# Patient Record
Sex: Male | Born: 1985 | Race: White | Hispanic: No | Marital: Single | Smoking: Heavy tobacco smoker
Health system: Southern US, Community
[De-identification: ages and names within clinical notes are randomized; demographics above are authoritative.]

## PROBLEM LIST (undated history)

## (undated) DIAGNOSIS — K254 Chronic or unspecified gastric ulcer with hemorrhage: Secondary | ICD-10-CM

## (undated) DIAGNOSIS — E669 Obesity, unspecified: Secondary | ICD-10-CM

## (undated) DIAGNOSIS — K8020 Calculus of gallbladder without cholecystitis without obstruction: Secondary | ICD-10-CM

## (undated) DIAGNOSIS — M109 Gout, unspecified: Secondary | ICD-10-CM

## (undated) DIAGNOSIS — L0390 Cellulitis, unspecified: Secondary | ICD-10-CM

## (undated) DIAGNOSIS — D62 Acute posthemorrhagic anemia: Secondary | ICD-10-CM

## (undated) DIAGNOSIS — N390 Urinary tract infection, site not specified: Secondary | ICD-10-CM

## (undated) DIAGNOSIS — K2210 Ulcer of esophagus without bleeding: Secondary | ICD-10-CM

## (undated) HISTORY — PX: TONSILLECTOMY: SUR1361

## (undated) SURGERY — EGD (ESOPHAGOGASTRODUODENOSCOPY)
Anesthesia: Moderate Sedation | Laterality: Left

---
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
## 2005-04-25 ENCOUNTER — Emergency Department (HOSPITAL_COMMUNITY): Admission: EM | Admit: 2005-04-25 | Discharge: 2005-04-26 | Payer: Self-pay | Admitting: Emergency Medicine

## 2006-12-17 ENCOUNTER — Emergency Department (HOSPITAL_COMMUNITY): Admission: EM | Admit: 2006-12-17 | Discharge: 2006-12-18 | Payer: Self-pay | Admitting: Emergency Medicine

## 2007-02-17 ENCOUNTER — Emergency Department (HOSPITAL_COMMUNITY): Admission: EM | Admit: 2007-02-17 | Discharge: 2007-02-17 | Payer: Self-pay | Admitting: Emergency Medicine

## 2007-04-05 ENCOUNTER — Emergency Department (HOSPITAL_COMMUNITY): Admission: EM | Admit: 2007-04-05 | Discharge: 2007-04-05 | Payer: Self-pay | Admitting: Emergency Medicine

## 2007-10-15 ENCOUNTER — Inpatient Hospital Stay (HOSPITAL_COMMUNITY): Admission: AD | Admit: 2007-10-15 | Discharge: 2007-10-22 | Payer: Self-pay | Admitting: Family Medicine

## 2008-04-19 ENCOUNTER — Emergency Department (HOSPITAL_COMMUNITY): Admission: EM | Admit: 2008-04-19 | Discharge: 2008-04-19 | Payer: Self-pay | Admitting: Emergency Medicine

## 2008-05-08 ENCOUNTER — Emergency Department (HOSPITAL_COMMUNITY): Admission: EM | Admit: 2008-05-08 | Discharge: 2008-05-08 | Payer: Self-pay | Admitting: Emergency Medicine

## 2008-05-10 ENCOUNTER — Emergency Department (HOSPITAL_COMMUNITY): Admission: EM | Admit: 2008-05-10 | Discharge: 2008-05-10 | Payer: Self-pay | Admitting: Emergency Medicine

## 2008-07-26 ENCOUNTER — Emergency Department (HOSPITAL_COMMUNITY): Admission: EM | Admit: 2008-07-26 | Discharge: 2008-07-26 | Payer: Self-pay | Admitting: Emergency Medicine

## 2008-11-15 ENCOUNTER — Emergency Department (HOSPITAL_COMMUNITY): Admission: EM | Admit: 2008-11-15 | Discharge: 2008-11-15 | Payer: Self-pay | Admitting: Emergency Medicine

## 2009-01-29 ENCOUNTER — Emergency Department (HOSPITAL_COMMUNITY): Admission: EM | Admit: 2009-01-29 | Discharge: 2009-01-29 | Payer: Self-pay | Admitting: Emergency Medicine

## 2010-05-21 ENCOUNTER — Emergency Department (HOSPITAL_COMMUNITY)
Admission: EM | Admit: 2010-05-21 | Discharge: 2010-05-21 | Disposition: A | Discharge status: Home / Self Care | Payer: Self-pay | Attending: Emergency Medicine | Admitting: Emergency Medicine

## 2010-05-21 DIAGNOSIS — M79609 Pain in unspecified limb: Secondary | ICD-10-CM | POA: Insufficient documentation

## 2010-05-21 DIAGNOSIS — M109 Gout, unspecified: Secondary | ICD-10-CM | POA: Insufficient documentation

## 2010-05-21 DIAGNOSIS — M7989 Other specified soft tissue disorders: Secondary | ICD-10-CM | POA: Insufficient documentation

## 2010-05-21 DIAGNOSIS — M2560 Stiffness of unspecified joint, not elsewhere classified: Secondary | ICD-10-CM | POA: Insufficient documentation

## 2010-06-24 ENCOUNTER — Emergency Department (HOSPITAL_COMMUNITY)
Admission: EM | Admit: 2010-06-24 | Discharge: 2010-06-25 | Disposition: A | Discharge status: Home / Self Care | Payer: Self-pay | Attending: Emergency Medicine | Admitting: Emergency Medicine

## 2010-06-24 DIAGNOSIS — M25539 Pain in unspecified wrist: Secondary | ICD-10-CM | POA: Insufficient documentation

## 2010-06-24 DIAGNOSIS — M79609 Pain in unspecified limb: Secondary | ICD-10-CM | POA: Insufficient documentation

## 2010-06-24 DIAGNOSIS — M109 Gout, unspecified: Secondary | ICD-10-CM | POA: Insufficient documentation

## 2010-06-24 DIAGNOSIS — M25439 Effusion, unspecified wrist: Secondary | ICD-10-CM | POA: Insufficient documentation

## 2010-06-24 DIAGNOSIS — F17200 Nicotine dependence, unspecified, uncomplicated: Secondary | ICD-10-CM | POA: Insufficient documentation

## 2010-07-11 ENCOUNTER — Emergency Department (HOSPITAL_COMMUNITY): Payer: Self-pay

## 2010-07-11 ENCOUNTER — Emergency Department (HOSPITAL_COMMUNITY)
Admission: EM | Admit: 2010-07-11 | Discharge: 2010-07-11 | Disposition: A | Discharge status: Home / Self Care | Payer: Self-pay | Attending: Emergency Medicine | Admitting: Emergency Medicine

## 2010-07-11 DIAGNOSIS — S93609A Unspecified sprain of unspecified foot, initial encounter: Secondary | ICD-10-CM | POA: Insufficient documentation

## 2010-07-11 DIAGNOSIS — X500XXA Overexertion from strenuous movement or load, initial encounter: Secondary | ICD-10-CM | POA: Insufficient documentation

## 2010-07-11 DIAGNOSIS — Y92009 Unspecified place in unspecified non-institutional (private) residence as the place of occurrence of the external cause: Secondary | ICD-10-CM | POA: Insufficient documentation

## 2010-08-01 LAB — CBC
MCHC: 33.1 g/dL (ref 30.0–36.0)
MCV: 88 fL (ref 78.0–100.0)
Platelets: 299 10*3/uL (ref 150–400)
RBC: 4.92 MIL/uL (ref 4.22–5.81)
RDW: 12.8 % (ref 11.5–15.5)

## 2010-08-01 LAB — BASIC METABOLIC PANEL
GFR calc Af Amer: >60 mL/min (ref >60)
Glucose, Bld: 110 mg/dL — ABNORMAL HIGH (ref 70–99)
Potassium: 3.6 mEq/L (ref 3.5–5.1)
Sodium: 137 mEq/L (ref 135–145)

## 2010-08-01 LAB — DIFFERENTIAL
Basophils Relative: 0 % (ref 0–1)
Eosinophils Relative: 2 % (ref 0–5)
Lymphocytes Relative: 22 % (ref 12–46)
Lymphs Abs: 2.8 10*3/uL (ref 0.7–4.0)
Monocytes Absolute: 1.1 10*3/uL — ABNORMAL HIGH (ref 0.1–1.0)
Monocytes Relative: 9 % (ref 3–12)

## 2010-08-30 NOTE — Group Therapy Note (Signed)
[name], [name]             ACCOUNT NO.:  [id number]   MEDICAL RECORD NO.:  [id number]          PATIENT TYPE:  INP   LOCATION:  A307                          FACILITY:  [facility]   PHYSICIAN:  Scott A. Gerda Diss, MD    DATE OF BIRTH:  August 12, 1985   DATE OF PROCEDURE:  DATE OF DISCHARGE:                                 PROGRESS NOTE   This gentleman is actually doing a little bit better.  His white count  11.4 and potassium is little bit low at 3.3, so we will need to  supplement that.  His cellulitis on his leg does not really look  tremendously improved, but the patient says it is somewhat compared to  where it was, still lot of inflammation and tenderness.  He needs to be  in the hospital and we will continue on with antibiotic treatment.      Scott A. Gerda Diss, MD  Electronically Signed     SAL/MEDQ  D:  10/17/2007  T:  10/17/2007  Job:  [id number]

## 2010-08-30 NOTE — H&P (Signed)
[name], [name]             ACCOUNT NO.:  [id number]   MEDICAL RECORD NO.:  [id number]          PATIENT TYPE:  INP   LOCATION:  A307                          FACILITY:  [facility]   PHYSICIAN:  Donna Bernard, M.D.DATE OF BIRTH:  11-26-1985   DATE OF ADMISSION:  10/15/2007  DATE OF DISCHARGE:  LH                              HISTORY & PHYSICAL   CHIEF COMPLAINT:  Cough, congestion, headache, chills, fever, left leg  pain, swelling, and tenderness.   SUBJECTIVE:  This patient is a 25 year old white male with history of  morbid obesity who arrives to the office on the date of admission with  multiple complaints.  He noted approximately 4 days ago, he developed  headache.  This associated with cough, congestion.  He felt chills,  achy, and fever.  He was aching from head to toe.  He had quite a bit of  cough and it was generally nonproductive.  Within a day of that, he  notes his left leg started swelling, noted redness, and tenderness and  he watched the red area swell more and more, in the last couple nights,  he has had fever and chills.   PRIOR HOSPITALIZATIONS:  None.   ALLERGIES:  PENICILLIN and SULFA.   SOCIAL HISTORY:  The patient lives with parents still, currently  unemployed.   CHRONIC MEDICATIONS:  None.   PRIOR MEDICAL HISTORY:  Significant for ongoing morbid obesity.  The  patient has been advised in the past, he needs to consider further  workup of his morbid obesity and he has declined that multiple times.   REVIEW OF SYSTEMS:  Otherwise negative.  Temperature 101 degrees, pulse  100.  Significant obesity noted.  HEENT:  Normal.  Mild nasal  congestion.  NECK:  Supple.  LUNGS:  Clear.  HEART:  Regular rhythm.  ABDOMEN:  Large.  No obvious tenderness.  Left anterior leg, impressive  large patch of cellulitis exquisitely tender associated with edema.   IMPRESSION:  1. Cellulitis.  2. Probable H1N1 flu since he is a day 4 with symptomatology there,      meds  will not help.  3. Morbid obesity.  Certainly, a risk factor for this cellulitis with      venostasis.   PLAN:  IV fluids, antibiotics, pain, nausea control.  We will check a D-  dimer.  Further recommendations noted in chart.      Donna Bernard, M.D.  Electronically Signed     WSL/MEDQ  D:  10/16/2007  T:  10/17/2007  Job:  [id number]

## 2010-09-02 NOTE — Discharge Summary (Signed)
NAME:  [name], [name]          ACCOUNT NO.:  [id number]   MEDICAL RECORD NO.:  [id number]         PATIENT TYPE:  PINP   LOCATION:  A307                          FACILITY:  [facility]   PHYSICIAN:  Donna Bernard, M.D.DATE OF BIRTH:  June 12, 1985   DATE OF ADMISSION:  10/15/2007  DATE OF DISCHARGE:  07/07/2009LH                               DISCHARGE SUMMARY   FINAL DIAGNOSES:  1. Cellulitis.  2. Flu.  3. Morbid obesity.   FINAL DISPOSITION:  1. The patient discharged home.  2. The patient recommended followup on July 13 with Dr. Simone Curia.  3. Vicodin ES 1 p.o.  q 4-6h p.r.n. for pain, #30, no refills.  4. Doxycycline 100 mg 1 p.o. b.i.d. for 10 days.  5. Keflex 500 mg, 1 q.i.d. for 10 days.   INITIAL HISTORY AND PHYSICAL:  Please see H&P as dictated.   HOSPITAL COURSE:  This patient is a 25 year old white male with history  of morbid obesity arrived into the office on the day of admission with  cough, congestion, and headaches several days prior and a left leg  swelling, pain, redness, and tenderness in the couple of days prior.  He  also had fever and chills.  The patient was felt to be suffering from  the flu.  He was placed on appropriate restrictions.  He was given IV  Rocephin and doxycycline for his cellulitis.  Morphine was administered  p.r.n. for pain.  The patient's obesity led to difficulties with IV  access, therefore, we had to place a PICC line for access.  The patient  was given Lovenox for his diminished mobility, physical therapy folks  were consulted to try to ambulate him.  The patient each day improved.  On the day of discharge, his tenderness was minimal.  His white blood  count had come down to 10,000.  I note a D-dimer soon after admission  revealed an elevated D-dimer at 1.03; however, his ultrasound was  completely negative.  In addition, his leg tenderness and swelling was  obviously consistent with the cellulitis that he was experiencing.   The  patient has a longstanding history of venous stasis.  Several times  during the last few days of hospitalization, he said in rather demanding  terms that he expected his leg to be totally normal before departing.  He expected for the redness to be completely gone and the pain to be  completely gone.  We tried to acquaint him with the reality of the  situation that these kind of infections generally take weeks to get back  towards the normal.  The patient also was requesting stronger pain meds  then we really preferred at discharge.  He stated he had taken Vicodin  all the time in recent years interestingly and it had not helped;  interestingly, our chart at the office showed no history of significant  narcotic use.  On the day of discharge, the patient was felt as stable as possible, so  he was discharged home with diagnoses and disposition as shown above.  He was strongly encouraged to take all  meds.  Followup as scheduled.  He  was advised to get around and ambulate as much as he possibly could.      Donna Bernard, M.D.  Electronically Signed     WSL/MEDQ  D:  10/29/2007  T:  10/30/2007  Job:  [id number]

## 2011-01-12 LAB — BASIC METABOLIC PANEL
BUN: 9
CO2: 28
CO2: 33 — ABNORMAL HIGH
Calcium: 8.7
Chloride: 100
GFR calc Af Amer: >60
GFR calc Af Amer: >60
GFR calc non Af Amer: >60
Glucose, Bld: 118 — ABNORMAL HIGH
Glucose, Bld: 121 — ABNORMAL HIGH
Potassium: 3.3 — ABNORMAL LOW
Sodium: 137
Sodium: 141

## 2011-01-12 LAB — DIFFERENTIAL
Basophils Absolute: 0
Basophils Absolute: 0
Basophils Relative: 0
Basophils Relative: 1
Eosinophils Absolute: 0.1
Eosinophils Relative: 1
Lymphocytes Relative: 12
Lymphocytes Relative: 20
Lymphs Abs: 1.9
Lymphs Abs: 2.1
Lymphs Abs: 2.3
Monocytes Absolute: 0.9
Monocytes Relative: 8
Monocytes Relative: 9
Neutro Abs: 13.6 — ABNORMAL HIGH
Neutro Abs: 6.9
Neutrophils Relative %: 81 — ABNORMAL HIGH
Neutrophils Relative %: 67

## 2011-01-12 LAB — CBC
HCT: 43.7
Hemoglobin: 14.8
Hemoglobin: 11.8 — ABNORMAL LOW
MCHC: 33.7
MCHC: 33.6
MCHC: 33.8
MCV: 86.8
MCV: 86.5
MCV: 87
Platelets: 211
RBC: 5.04
RBC: 4.02 — ABNORMAL LOW
RDW: 13.6
WBC: 10.3
WBC: 11.4 — ABNORMAL HIGH

## 2011-01-27 LAB — DIFFERENTIAL
Basophils Absolute: 0
Basophils Relative: 0
Eosinophils Absolute: 0.1
Eosinophils Relative: 1
Monocytes Absolute: 1 — ABNORMAL HIGH

## 2011-01-27 LAB — CBC
HCT: 44
Hemoglobin: 14.9
MCHC: 33.8
MCV: 85.7
Platelets: 294
RDW: 13.4

## 2011-01-27 LAB — BASIC METABOLIC PANEL
BUN: 8
CO2: 28
GFR calc non Af Amer: >60
Glucose, Bld: 114 — ABNORMAL HIGH
Potassium: 3.8
Sodium: 139

## 2011-01-27 LAB — URIC ACID: Uric Acid, Serum: 12.4 — ABNORMAL HIGH

## 2012-03-05 ENCOUNTER — Emergency Department (HOSPITAL_COMMUNITY)
Admission: EM | Admit: 2012-03-05 | Discharge: 2012-03-05 | Disposition: A | Discharge status: Home / Self Care | Payer: Self-pay | Attending: Emergency Medicine | Admitting: Emergency Medicine

## 2012-03-05 ENCOUNTER — Encounter (HOSPITAL_COMMUNITY): Payer: Self-pay | Admitting: Emergency Medicine

## 2012-03-05 DIAGNOSIS — M109 Gout, unspecified: Secondary | ICD-10-CM | POA: Insufficient documentation

## 2012-03-05 DIAGNOSIS — IMO0001 Reserved for inherently not codable concepts without codable children: Secondary | ICD-10-CM | POA: Insufficient documentation

## 2012-03-05 DIAGNOSIS — F17200 Nicotine dependence, unspecified, uncomplicated: Secondary | ICD-10-CM | POA: Insufficient documentation

## 2012-03-05 DIAGNOSIS — M79609 Pain in unspecified limb: Secondary | ICD-10-CM | POA: Insufficient documentation

## 2012-03-05 DIAGNOSIS — M2550 Pain in unspecified joint: Secondary | ICD-10-CM | POA: Insufficient documentation

## 2012-03-05 HISTORY — DX: Gout, unspecified: M10.9

## 2012-03-05 MED ORDER — HYDROCODONE-ACETAMINOPHEN 7.5-325 MG PO TABS: 1.0000 | ORAL_TABLET | ORAL | Status: DC | PRN

## 2012-03-05 MED ORDER — DICLOFENAC SODIUM 75 MG PO TBEC: 75.0000 mg | DELAYED_RELEASE_TABLET | Freq: Two times a day (BID) | ORAL | Status: DC

## 2012-03-05 MED ORDER — MORPHINE SULFATE 4 MG/ML IJ SOLN
8.0000 mg | Freq: Once | INTRAMUSCULAR | Status: AC
  Administered 2012-03-05: 8 mg via INTRAMUSCULAR
  Filled 2012-03-05: qty 2

## 2012-03-05 MED ORDER — ONDANSETRON HCL 4 MG PO TABS
4.0000 mg | ORAL_TABLET | Freq: Once | ORAL | Status: AC
  Administered 2012-03-05: 4 mg via ORAL
  Filled 2012-03-05: qty 1

## 2012-03-05 MED ORDER — COLCHICINE 0.6 MG PO TABS: 0.6000 mg | ORAL_TABLET | Freq: Every day | ORAL | Status: DC

## 2012-03-05 MED ORDER — COLCHICINE 0.6 MG PO TABS
1.2000 mg | ORAL_TABLET | Freq: Once | ORAL | Status: AC
  Administered 2012-03-05: 1.2 mg via ORAL
  Filled 2012-03-05: qty 2

## 2012-03-05 NOTE — ED Notes (Signed)
Patient c/o right foot pain x2 days. Per patient hx of gout, in which this is what the pain feels like.

## 2012-03-05 NOTE — ED Provider Notes (Signed)
History     CSN: [id number]  Arrival date & time 03/05/12  4098   First MD Initiated Contact with Patient 03/05/12 [phone]      Chief Complaint  Patient presents with  . Gout  . Foot Pain    (Consider location/radiation/quality/duration/timing/severity/associated sxs/prior treatment) Patient is a 26 y.o. male presenting with lower extremity pain. The history is provided by the patient.  Foot Pain The current episode started in the past 7 days. The problem occurs daily. The problem has been gradually worsening. Associated symptoms include arthralgias and myalgias. Pertinent negatives include no abdominal pain, chest pain, coughing or neck pain. The symptoms are aggravated by standing and walking. He has tried nothing for the symptoms. The treatment provided no relief.    Past Medical History  Diagnosis Date  . Gout     Past Surgical History  Procedure Date  . Tonsillectomy     Family History  Problem Relation Age of Onset  . Heart failure Mother     History  Substance Use Topics  . Smoking status: Current Every Day Smoker -- 0.5 packs/day for 7 years    Types: Cigarettes  . Smokeless tobacco: Former Neurosurgeon    Types: Snuff  . Alcohol Use: Yes     Comment: very occasional      Review of Systems  Constitutional: Negative for activity change.       All ROS Neg except as noted in HPI  HENT: Negative for nosebleeds and neck pain.   Eyes: Negative for photophobia and discharge.  Respiratory: Negative for cough, shortness of breath and wheezing.   Cardiovascular: Negative for chest pain and palpitations.  Gastrointestinal: Negative for abdominal pain and blood in stool.  Genitourinary: Negative for dysuria, frequency and hematuria.  Musculoskeletal: Positive for myalgias and arthralgias. Negative for back pain.  Skin: Negative.   Neurological: Negative for dizziness, seizures and speech difficulty.  Psychiatric/Behavioral: Negative for hallucinations and confusion.     Allergies  Asa; Penicillins; and Sulfa antibiotics  Home Medications   Current Outpatient Rx  Name  Route  Sig  Dispense  Refill  . NAPROXEN SODIUM 220 MG PO TABS   Oral   Take 880 mg by mouth 2 (two) times daily as needed. Pain.         Marland Kitchen COLCHICINE 0.6 MG PO TABS   Oral   Take 1 tablet (0.6 mg total) by mouth daily.   10 tablet   0   . DICLOFENAC SODIUM 75 MG PO TBEC   Oral   Take 1 tablet (75 mg total) by mouth 2 (two) times daily.   12 tablet   0   . HYDROCODONE-ACETAMINOPHEN 7.5-325 MG PO TABS   Oral   Take 1 tablet by mouth every 4 (four) hours as needed for pain.   20 tablet   0     BP 136/88  Pulse 89  Temp 97.8 F (36.6 C) (Oral)  Resp 24  Ht 5\' 7"  (1.702 m)  SpO2 100%  Physical Exam  Nursing note and vitals reviewed. Constitutional: He is oriented to person, place, and time. He appears well-developed and well-nourished.  Non-toxic appearance.  HENT:  Head: Normocephalic.  Right Ear: Tympanic membrane and external ear normal.  Left Ear: Tympanic membrane and external ear normal.  Eyes: EOM and lids are normal. Pupils are equal, round, and reactive to light.  Neck: Normal range of motion. Neck supple. Carotid bruit is not present.  Cardiovascular: Normal rate, regular rhythm, normal  heart sounds, intact distal pulses and normal pulses.   Pulmonary/Chest: Breath sounds normal. No respiratory distress.       Course breath sounds. No focal consolidation.  Abdominal: Soft. Bowel sounds are normal. There is no tenderness. There is no guarding.  Musculoskeletal: Normal range of motion.       Increase redness of the right 1st toe and foot. Not hot. DP pulse 2+.  Pain to even light touch.  Lymphadenopathy:       Head (right side): No submandibular adenopathy present.       Head (left side): No submandibular adenopathy present.    He has no cervical adenopathy.  Neurological: He is alert and oriented to person, place, and time. He has normal strength.  No cranial nerve deficit or sensory deficit.  Skin: Skin is warm and dry.  Psychiatric: He has a normal mood and affect. His speech is normal.    ED Course  Procedures (including critical care time)  Labs Reviewed - No data to display No results found.   1. Gout attack       MDM  I have reviewed nursing notes, vital signs, and all appropriate lab and imaging results for this patient. Pt has hx of gout. He is having an acute on chronic attack today. Rx for colchicine, diclofenac, and norco given to the patient.       Kathie Dike, [place] 03/05/12 [phone]

## 2012-03-06 NOTE — ED Provider Notes (Signed)
Medical screening examination/treatment/procedure(s) were conducted as a shared visit with non-physician practitioner(s) and myself.  I personally evaluated the patient during the encounter.  History and physical consistent with gout. Discharge Rx per physician's assistant  Donnetta Hutching, MD 03/06/12 [phone]

## 2012-03-25 ENCOUNTER — Encounter (HOSPITAL_COMMUNITY): Payer: Self-pay | Admitting: *Deleted

## 2012-03-25 ENCOUNTER — Emergency Department (HOSPITAL_COMMUNITY)
Admission: EM | Admit: 2012-03-25 | Discharge: 2012-03-25 | Disposition: A | Discharge status: Home / Self Care | Payer: Self-pay | Attending: Emergency Medicine | Admitting: Emergency Medicine

## 2012-03-25 DIAGNOSIS — M79609 Pain in unspecified limb: Secondary | ICD-10-CM | POA: Insufficient documentation

## 2012-03-25 DIAGNOSIS — M7989 Other specified soft tissue disorders: Secondary | ICD-10-CM | POA: Insufficient documentation

## 2012-03-25 DIAGNOSIS — Z862 Personal history of diseases of the blood and blood-forming organs and certain disorders involving the immune mechanism: Secondary | ICD-10-CM | POA: Insufficient documentation

## 2012-03-25 DIAGNOSIS — L02419 Cutaneous abscess of limb, unspecified: Secondary | ICD-10-CM | POA: Insufficient documentation

## 2012-03-25 DIAGNOSIS — F17200 Nicotine dependence, unspecified, uncomplicated: Secondary | ICD-10-CM | POA: Insufficient documentation

## 2012-03-25 DIAGNOSIS — R509 Fever, unspecified: Secondary | ICD-10-CM | POA: Insufficient documentation

## 2012-03-25 DIAGNOSIS — Z9889 Other specified postprocedural states: Secondary | ICD-10-CM | POA: Insufficient documentation

## 2012-03-25 DIAGNOSIS — Z79899 Other long term (current) drug therapy: Secondary | ICD-10-CM | POA: Insufficient documentation

## 2012-03-25 DIAGNOSIS — Z8639 Personal history of other endocrine, nutritional and metabolic disease: Secondary | ICD-10-CM | POA: Insufficient documentation

## 2012-03-25 DIAGNOSIS — R5381 Other malaise: Secondary | ICD-10-CM | POA: Insufficient documentation

## 2012-03-25 DIAGNOSIS — R5383 Other fatigue: Secondary | ICD-10-CM | POA: Insufficient documentation

## 2012-03-25 DIAGNOSIS — L03119 Cellulitis of unspecified part of limb: Secondary | ICD-10-CM

## 2012-03-25 HISTORY — DX: Cellulitis, unspecified: L03.90

## 2012-03-25 MED ORDER — LIDOCAINE HCL (PF) 1 % IJ SOLN
INTRAMUSCULAR | Status: AC
  Administered 2012-03-25: 2.1 mL
  Filled 2012-03-25: qty 5

## 2012-03-25 MED ORDER — CEPHALEXIN 500 MG PO CAPS: 500.0000 mg | ORAL_CAPSULE | Freq: Four times a day (QID) | ORAL | Status: DC

## 2012-03-25 MED ORDER — CEFTRIAXONE SODIUM 1 G IJ SOLR
1.0000 g | Freq: Once | INTRAMUSCULAR | Status: AC
  Administered 2012-03-25: 1 g via INTRAMUSCULAR
  Filled 2012-03-25: qty 10

## 2012-03-25 MED ORDER — HYDROCODONE-ACETAMINOPHEN 5-500 MG PO TABS: 1.0000 | ORAL_TABLET | Freq: Four times a day (QID) | ORAL | Status: DC | PRN

## 2012-03-25 NOTE — ED Notes (Signed)
Pt reports right lower leg swollen and red x3 days, "popped a blister to right lower leg today", denies any fever or chills.

## 2012-03-25 NOTE — ED Notes (Signed)
Rt leg swollen , red for 3 days,

## 2012-03-25 NOTE — ED Provider Notes (Signed)
History     CSN: [id number]  Arrival date & time 03/25/12  1221   First MD Initiated Contact with Patient 03/25/12 1345      Chief Complaint  Patient presents with  . Cellulitis    (Consider location/radiation/quality/duration/timing/severity/associated sxs/prior treatment) HPI Comments: Patient with history of morbid obesity, recurrent cellulitis to the lower legs.  He presents today complaining of pain, swelling, redness to the legs and fevers.  No vomiting.  No rigors.  He also denies any injury or trauma.  The pain is moderate and is worse with palpation but there are no alleviating factors.    He has had this in the past and usually responds to keflex.  The history is provided by the patient.    Past Medical History  Diagnosis Date  . Gout   . Cellulitis     Past Surgical History  Procedure Date  . Tonsillectomy     Family History  Problem Relation Age of Onset  . Heart failure Mother     History  Substance Use Topics  . Smoking status: Current Every Day Smoker -- 0.5 packs/day for 7 years    Types: Cigarettes  . Smokeless tobacco: Former Neurosurgeon    Types: Snuff  . Alcohol Use: Yes     Comment: very occasional      Review of Systems  Constitutional: Positive for fever and fatigue. Negative for chills.  All other systems reviewed and are negative.    Allergies  Asa; Penicillins; and Sulfa antibiotics  Home Medications   Current Outpatient Rx  Name  Route  Sig  Dispense  Refill  . NAPROXEN SODIUM 220 MG PO TABS   Oral   Take 880 mg by mouth 2 (two) times daily as needed. Pain.         . CEPHALEXIN 500 MG PO CAPS   Oral   Take 1 capsule (500 mg total) by mouth 4 (four) times daily.   40 capsule   0   . HYDROCODONE-ACETAMINOPHEN 5-500 MG PO TABS   Oral   Take 1-2 tablets by mouth every 6 (six) hours as needed for pain.   20 tablet   0     BP 150/84  Pulse 99  Temp 97.7 F (36.5 C) (Oral)  Resp 20  Ht 5\' 7"  (1.702 m)  Wt 447 lb 6  oz (202.928 kg)  BMI 70.07 kg/m2  SpO2 97%  Physical Exam  Nursing note and vitals reviewed. Constitutional: He is oriented to person, place, and time. He appears well-developed and well-nourished. No distress.  HENT:  Head: Normocephalic and atraumatic.  Mouth/Throat: Oropharynx is clear and moist.  Neck: Normal range of motion. Neck supple.  Cardiovascular: Normal rate and regular rhythm.   No murmur heard. Pulmonary/Chest: Effort normal and breath sounds normal. No respiratory distress.  Musculoskeletal:       There is marked redness, swelling, warmth to the touch to the right lower extremity.  There is 1-2+ edema present.    Neurological: He is alert and oriented to person, place, and time.  Skin: Skin is warm and dry. He is not diaphoretic.    ED Course  Procedures (including critical care time)  Labs Reviewed - No data to display No results found.   1. Lower extremity cellulitis       MDM  Looks like cellulitis and he has had this many times in the past.  Will treat with ceftriaxone im and keflex po.  I discussed admission with  him, however he prefers to do this as an outpatient.  He understands to return if he develops high fevers, chills, vomiting, or other problems.          Geoffery Lyons, MD 03/25/12 1534

## 2012-05-03 ENCOUNTER — Encounter (HOSPITAL_COMMUNITY): Payer: Self-pay | Admitting: *Deleted

## 2012-05-03 ENCOUNTER — Emergency Department (HOSPITAL_COMMUNITY)
Admission: EM | Admit: 2012-05-03 | Discharge: 2012-05-03 | Disposition: A | Discharge status: Home / Self Care | Payer: Self-pay | Attending: Emergency Medicine | Admitting: Emergency Medicine

## 2012-05-03 DIAGNOSIS — M2550 Pain in unspecified joint: Secondary | ICD-10-CM | POA: Insufficient documentation

## 2012-05-03 DIAGNOSIS — F17200 Nicotine dependence, unspecified, uncomplicated: Secondary | ICD-10-CM | POA: Insufficient documentation

## 2012-05-03 DIAGNOSIS — M2540 Effusion, unspecified joint: Secondary | ICD-10-CM | POA: Insufficient documentation

## 2012-05-03 DIAGNOSIS — M79609 Pain in unspecified limb: Secondary | ICD-10-CM | POA: Insufficient documentation

## 2012-05-03 DIAGNOSIS — M7989 Other specified soft tissue disorders: Secondary | ICD-10-CM | POA: Insufficient documentation

## 2012-05-03 DIAGNOSIS — Z872 Personal history of diseases of the skin and subcutaneous tissue: Secondary | ICD-10-CM | POA: Insufficient documentation

## 2012-05-03 DIAGNOSIS — M109 Gout, unspecified: Secondary | ICD-10-CM

## 2012-05-03 MED ORDER — INDOMETHACIN 25 MG PO CAPS
25.0000 mg | ORAL_CAPSULE | Freq: Three times a day (TID) | ORAL | Status: DC | PRN
Start: 2012-05-03 — End: 2012-06-16

## 2012-05-03 MED ORDER — HYDROCODONE-ACETAMINOPHEN 5-325 MG PO TABS
2.0000 | ORAL_TABLET | Freq: Once | ORAL | Status: AC
  Administered 2012-05-03: 2 via ORAL
  Filled 2012-05-03: qty 2

## 2012-05-03 MED ORDER — HYDROCODONE-ACETAMINOPHEN 5-325 MG PO TABS: 1.0000 | ORAL_TABLET | Freq: Four times a day (QID) | ORAL | Status: DC | PRN

## 2012-05-03 MED ORDER — COLCHICINE 0.6 MG PO TABS
0.6000 mg | ORAL_TABLET | Freq: Once | ORAL | Status: AC
  Administered 2012-05-03: 0.6 mg via ORAL
  Filled 2012-05-03: qty 1

## 2012-05-03 MED ORDER — INDOMETHACIN 25 MG PO CAPS
50.0000 mg | ORAL_CAPSULE | Freq: Once | ORAL | Status: AC
  Administered 2012-05-03: 50 mg via ORAL
  Filled 2012-05-03: qty 2

## 2012-05-03 NOTE — ED Notes (Signed)
Left in c/o family for transport home; alert, in no distress; instructions, prescriptions and f/u information given/reviewed - verbalizes understanding.

## 2012-05-03 NOTE — ED Provider Notes (Signed)
Medical screening examination/treatment/procedure(s) were performed by non-physician practitioner and as supervising physician I was immediately available for consultation/collaboration. Aneira Cavitt, MD, FACEP   Daleisa Halperin L Kalise Fickett, MD 05/03/12 1732 

## 2012-05-03 NOTE — ED Notes (Signed)
Pain rt wrist , arm , onset today, pt thinks is due to gout.

## 2012-05-03 NOTE — ED Provider Notes (Signed)
History     CSN: [id number]  Arrival date & time 05/03/12  1157   First MD Initiated Contact with Patient 05/03/12 1304      Chief Complaint  Patient presents with  . Gout    (Consider location/radiation/quality/duration/timing/severity/associated sxs/prior treatment) HPI Comments: 27 year old morbidly obese male with a history of gout presents to the emergency department complaining of right hand pain and swelling when he woke up this morning. Describes the pain as "really bad", radiating up his arm rated 10 out of 10. States this is where he normally gets his gout flareups and this feels exactly the same as the past. He has not tried any alleviating factors for his pain. Denies fever or chills. Admits to eating fried chicken last night which may have set off the flare up.  The history is provided by the patient.    Past Medical History  Diagnosis Date  . Gout   . Cellulitis     Past Surgical History  Procedure Date  . Tonsillectomy     Family History  Problem Relation Age of Onset  . Heart failure Mother     History  Substance Use Topics  . Smoking status: Current Every Day Smoker -- 0.5 packs/day for 7 years    Types: Cigarettes  . Smokeless tobacco: Former Neurosurgeon    Types: Snuff  . Alcohol Use: Yes     Comment: very occasional      Review of Systems  Constitutional: Negative for fever and chills.  Musculoskeletal: Positive for joint swelling and arthralgias.  Skin: Positive for color change.  All other systems reviewed and are negative.    Allergies  Asa; Penicillins; and Sulfa antibiotics  Home Medications   Current Outpatient Rx  Name  Route  Sig  Dispense  Refill  . HYDROCODONE-ACETAMINOPHEN 5-325 MG PO TABS   Oral   Take 1-2 tablets by mouth every 6 (six) hours as needed for pain.   10 tablet   0   . INDOMETHACIN 25 MG PO CAPS   Oral   Take 1 capsule (25 mg total) by mouth 3 (three) times daily as needed.   30 capsule   0     BP  140/76  Pulse 91  Temp 98 F (36.7 C) (Oral)  Resp 18  Ht 5\' 7"  (1.702 m)  Wt 447 lb (202.758 kg)  BMI 70.01 kg/m2  SpO2 95%  Physical Exam  Nursing note and vitals reviewed. Constitutional: He is oriented to person, place, and time. He appears well-developed. No distress.       Morbidly obese  HENT:  Head: Normocephalic and atraumatic.  Mouth/Throat: Oropharynx is clear and moist.  Eyes: Conjunctivae normal are normal.  Neck: Normal range of motion. Neck supple.  Cardiovascular: Normal rate, regular rhythm, normal heart sounds and intact distal pulses.   Pulmonary/Chest: Effort normal and breath sounds normal.  Musculoskeletal:       Right hand: He exhibits decreased range of motion (due to pain in 2nd mcp), tenderness and swelling (over 2nd mcp). He exhibits normal capillary refill. normal sensation noted.       Hands: Neurological: He is alert and oriented to person, place, and time.  Skin: There is erythema.  Psychiatric: He has a normal mood and affect. His behavior is normal.    ED Course  Procedures (including critical care time)  Labs Reviewed - No data to display No results found.   1. Acute gout       MDM  27 y/o male with acute gout flare up similar to his last flare ups. No fever or chills. Rx indomethacin and norco. Return precautions discussed. Proper dietary changes given. Patient states understanding of plan and is agreeable.         Trevor Mace, PA-C 05/03/12 1329

## 2012-05-16 ENCOUNTER — Encounter (HOSPITAL_COMMUNITY): Payer: Self-pay

## 2012-05-16 ENCOUNTER — Emergency Department (HOSPITAL_COMMUNITY): Payer: Self-pay

## 2012-05-16 ENCOUNTER — Emergency Department (HOSPITAL_COMMUNITY)
Admission: EM | Admit: 2012-05-16 | Discharge: 2012-05-16 | Disposition: A | Discharge status: Home / Self Care | Payer: Self-pay | Attending: Emergency Medicine | Admitting: Emergency Medicine

## 2012-05-16 DIAGNOSIS — Z79899 Other long term (current) drug therapy: Secondary | ICD-10-CM | POA: Insufficient documentation

## 2012-05-16 DIAGNOSIS — W1809XA Striking against other object with subsequent fall, initial encounter: Secondary | ICD-10-CM | POA: Insufficient documentation

## 2012-05-16 DIAGNOSIS — Z791 Long term (current) use of non-steroidal anti-inflammatories (NSAID): Secondary | ICD-10-CM | POA: Insufficient documentation

## 2012-05-16 DIAGNOSIS — Y929 Unspecified place or not applicable: Secondary | ICD-10-CM | POA: Insufficient documentation

## 2012-05-16 DIAGNOSIS — F17200 Nicotine dependence, unspecified, uncomplicated: Secondary | ICD-10-CM | POA: Insufficient documentation

## 2012-05-16 DIAGNOSIS — S7000XA Contusion of unspecified hip, initial encounter: Secondary | ICD-10-CM | POA: Insufficient documentation

## 2012-05-16 DIAGNOSIS — Y939 Activity, unspecified: Secondary | ICD-10-CM | POA: Insufficient documentation

## 2012-05-16 DIAGNOSIS — S7001XA Contusion of right hip, initial encounter: Secondary | ICD-10-CM

## 2012-05-16 DIAGNOSIS — Z872 Personal history of diseases of the skin and subcutaneous tissue: Secondary | ICD-10-CM | POA: Insufficient documentation

## 2012-05-16 DIAGNOSIS — M109 Gout, unspecified: Secondary | ICD-10-CM | POA: Insufficient documentation

## 2012-05-16 MED ORDER — HYDROCODONE-ACETAMINOPHEN 5-325 MG PO TABS: ORAL_TABLET | ORAL | Status: DC

## 2012-05-16 MED ORDER — OXYCODONE-ACETAMINOPHEN 5-325 MG PO TABS
2.0000 | ORAL_TABLET | Freq: Once | ORAL | Status: AC
  Administered 2012-05-16: 2 via ORAL
  Filled 2012-05-16: qty 2

## 2012-05-16 NOTE — ED Provider Notes (Signed)
History     CSN: [id number]  Arrival date & time 05/16/12  1639   First MD Initiated Contact with Patient 05/16/12 1701      Chief Complaint  Patient presents with  . Hip Pain     HPI Pt was seen at 1705.   Per pt, c/o gradual onset and persistence of constant right hip and buttock "pain" that began 2 days ago.  Pt states he slipped while getting out of a car and hit his right hip and buttock area against the car door.  Has had pain in the area since.  Has been ambulatory since the incident.  Denies falling.  Denies LBP, no abd pain, no knee/ankle/foot pain, no focal motor weakness, no tingling/numbness in extremities.    Past Medical History  Diagnosis Date  . Gout   . Cellulitis     Past Surgical History  Procedure Date  . Tonsillectomy     Family History  Problem Relation Age of Onset  . Heart failure Mother     History  Substance Use Topics  . Smoking status: Current Every Day Smoker -- 0.5 packs/day for 7 years    Types: Cigarettes  . Smokeless tobacco: Former Neurosurgeon    Types: Snuff  . Alcohol Use: Yes     Comment: very occasional    Review of Systems ROS: Statement: All systems negative except as marked or noted in the HPI; Constitutional: Negative for fever and chills. ; ; Eyes: Negative for eye pain, redness and discharge. ; ; ENMT: Negative for ear pain, hoarseness, nasal congestion, sinus pressure and sore throat. ; ; Cardiovascular: Negative for chest pain, palpitations, diaphoresis, dyspnea and peripheral edema. ; ; Respiratory: Negative for cough, wheezing and stridor. ; ; Gastrointestinal: Negative for nausea, vomiting, diarrhea, abdominal pain, blood in stool, hematemesis, jaundice and rectal bleeding. . ; ; Genitourinary: Negative for dysuria, flank pain and hematuria. ; ; Musculoskeletal: +right hip pain. Negative for back pain and neck pain. Negative for swelling and deformity..; ; Skin: Negative for pruritus, rash, abrasions, blisters, bruising and skin  lesion.; ; Neuro: Negative for headache, lightheadedness and neck stiffness. Negative for weakness, altered level of consciousness , altered mental status, extremity weakness, paresthesias, involuntary movement, seizure and syncope.       Allergies  Asa; Penicillins; and Sulfa antibiotics  Home Medications   Current Outpatient Rx  Name  Route  Sig  Dispense  Refill  . NAPROXEN SODIUM 220 MG PO TABS   Oral   Take 100 mg by mouth once as needed. Patient took 3 tablets then took 2 additional tablets one hour later         . HYDROCODONE-ACETAMINOPHEN 5-325 MG PO TABS   Oral   Take 1-2 tablets by mouth every 6 (six) hours as needed for pain.   10 tablet   0   . INDOMETHACIN 25 MG PO CAPS   Oral   Take 1 capsule (25 mg total) by mouth 3 (three) times daily as needed.   30 capsule   0     BP 121/67  Pulse 90  Temp 97.5 F (36.4 C) (Oral)  Resp 18  SpO2 98%  Physical Exam 1710: Physical examination:  Nursing notes reviewed; Vital signs and O2 SAT reviewed;  Constitutional: Well developed, Well nourished, Morbidly obese. Well hydrated, In no acute distress; Head:  Normocephalic, atraumatic; Eyes: EOMI, PERRL, No scleral icterus; ENMT: Mouth and pharynx normal, Mucous membranes moist; Neck: Supple, Full range of motion, No  lymphadenopathy; Cardiovascular: Regular rate and rhythm, No gallop; Respiratory: Breath sounds clear & equal bilaterally, No rales, rhonchi, wheezes.  Speaking full sentences with ease, Normal respiratory effort/excursion; Chest: Nontender, Movement normal; Abdomen: Soft, morbidly obese. Nontender, Normal bowel sounds; Genitourinary: No CVA tenderness; Extremities: Pulses normal, Pelvis stable. +mild TTP right hip and right buttock. No open wounds, no erythema, no ecchymosis, no deformity. NT right knee/ankle/foot. No edema, No calf edema or asymmetry.; Neuro: AA&Ox3, Major CN grossly intact.  Speech clear. Climbs on and off stretcher and chair by himself without  difficulty. Gait steady. No gross focal motor or sensory deficits in extremities.; Skin: Color normal, Warm, Dry.   ED Course  Procedures     MDM  MDM Reviewed: nursing note, vitals and previous chart Interpretation: x-ray     Dg Hip Complete Right 05/16/2012  *RADIOLOGY REPORT*  Clinical Data: Fall, right hip pain and leg pain.  RIGHT HIP - COMPLETE 2+ VIEW  Comparison: None.  Findings: Hip joints are symmetric and unremarkable.  SI joints also symmetric.  No bony abnormality.  No fracture, subluxation or dislocation.  IMPRESSION: No bony abnormality.   Original Report Authenticated By: Charlett Nose, M.D.     1745:  No fx on XR. Neuro exam intact. Likely contusion, will tx symptomatically at this time.  Pt has been ambulating around the exam room. States it "hurts" to walk.  Will give crutches for comfort/support for the next few days. Dx and testing d/w pt and family.  Questions answered.  Verb understanding, agreeable to d/c home with outpt f/u.      Laray Anger, DO 05/19/12 [phone]

## 2012-05-16 NOTE — ED Notes (Signed)
Pt c/o right hip pain x1 day. Pt states he slipped and fell into car door last night. Pt states pain has gradually worsened over past 24 hours. Pt is unable to walk due to pain.

## 2012-05-16 NOTE — ED Notes (Signed)
Pt reports falling into a car door and hitting his rt hip Tuesday night.  Pt reports pain worsening today.  Pt reports inability to walk d/t pain.

## 2012-06-16 ENCOUNTER — Emergency Department (HOSPITAL_COMMUNITY)
Admission: EM | Admit: 2012-06-16 | Discharge: 2012-06-16 | Disposition: A | Discharge status: Home / Self Care | Payer: Medicaid Other | Attending: Emergency Medicine | Admitting: Emergency Medicine

## 2012-06-16 ENCOUNTER — Encounter (HOSPITAL_COMMUNITY): Payer: Self-pay

## 2012-06-16 DIAGNOSIS — M109 Gout, unspecified: Secondary | ICD-10-CM | POA: Insufficient documentation

## 2012-06-16 DIAGNOSIS — Z872 Personal history of diseases of the skin and subcutaneous tissue: Secondary | ICD-10-CM | POA: Insufficient documentation

## 2012-06-16 DIAGNOSIS — R609 Edema, unspecified: Secondary | ICD-10-CM | POA: Insufficient documentation

## 2012-06-16 DIAGNOSIS — F17200 Nicotine dependence, unspecified, uncomplicated: Secondary | ICD-10-CM | POA: Insufficient documentation

## 2012-06-16 MED ORDER — IBUPROFEN 800 MG PO TABS
800.0000 mg | ORAL_TABLET | Freq: Once | ORAL | Status: AC
  Administered 2012-06-16: 800 mg via ORAL
  Filled 2012-06-16: qty 1

## 2012-06-16 MED ORDER — OXYCODONE-ACETAMINOPHEN 5-325 MG PO TABS: 2.0000 | ORAL_TABLET | ORAL | Status: DC | PRN

## 2012-06-16 MED ORDER — IBUPROFEN 800 MG PO TABS: 800.0000 mg | ORAL_TABLET | Freq: Three times a day (TID) | ORAL | Status: DC

## 2012-06-16 MED ORDER — OXYCODONE-ACETAMINOPHEN 5-325 MG PO TABS: 1.0000 | ORAL_TABLET | ORAL | Status: DC | PRN

## 2012-06-16 MED ORDER — OXYCODONE-ACETAMINOPHEN 5-325 MG PO TABS
2.0000 | ORAL_TABLET | Freq: Once | ORAL | Status: AC
  Administered 2012-06-16: 2 via ORAL
  Filled 2012-06-16: qty 2

## 2012-06-16 NOTE — ED Notes (Signed)
Pt has hx of gout. Pt says pain started at 3 am this morning in left foot. Mild swelling and redness noted in foot. Pedal pulse present.

## 2012-06-18 NOTE — ED Provider Notes (Signed)
History     CSN: [id number]  Arrival date & time 06/16/12  1035   First MD Initiated Contact with Patient 06/16/12 1150      Chief Complaint  Patient presents with  . Foot Pain    (Consider location/radiation/quality/duration/timing/severity/associated sxs/prior treatment) HPI Comments: [name] is a 27 y.o. Male presenting with a flair of his gout which woke him around 3 am this morning with severe pain and swelling at the base of his left great toe.  He has frequent flare ups, most recently was treated in January for a flare involving his right hand which has completely resolved.  Pain is constant,  Aching and severe,  Intense which even light palpation.  He denies injury to the site,  No rashes or trauma to the skin.  He has had no nausea or vomiting,  No fevers or chills. There is no radiation of pain and he took 2 aleve tablets early this am which did not offer any relief.       The history is provided by the patient.    Past Medical History  Diagnosis Date  . Gout   . Cellulitis     Past Surgical History  Procedure Laterality Date  . Tonsillectomy      Family History  Problem Relation Age of Onset  . Heart failure Mother     History  Substance Use Topics  . Smoking status: Current Every Day Smoker -- 0.50 packs/day for 7 years    Types: Cigarettes  . Smokeless tobacco: Former Neurosurgeon    Types: Snuff  . Alcohol Use: Yes     Comment: very occasional      Review of Systems  Constitutional: Negative for fever and chills.  HENT: Negative for facial swelling.   Respiratory: Negative for shortness of breath and wheezing.   Musculoskeletal: Positive for joint swelling and arthralgias.  Skin: Positive for color change. Negative for rash and wound.  Neurological: Negative for numbness.    Allergies  Asa; Penicillins; and Sulfa antibiotics  Home Medications   Current Outpatient Rx  Name  Route  Sig  Dispense  Refill  . naproxen sodium (ALEVE) 220 MG  tablet   Oral   Take 440 mg by mouth daily as needed (pain). Patient took 3 tablets then took 2 additional tablets one hour later         . ibuprofen (ADVIL,MOTRIN) 800 MG tablet   Oral   Take 1 tablet (800 mg total) by mouth 3 (three) times daily.   21 tablet   0   . oxyCODONE-acetaminophen (PERCOCET/ROXICET) 5-325 MG per tablet   Oral   Take 1 tablet by mouth every 4 (four) hours as needed for pain.   20 tablet   0     BP 134/88  Pulse 108  Temp(Src) 98.7 F (37.1 C)  Resp 20  Ht 5\' 6"  (1.676 m)  Wt 439 lb (199.129 kg)  BMI 70.89 kg/m2  SpO2 97%  Physical Exam  Constitutional: He appears well-developed and well-nourished.  HENT:  Head: Atraumatic.  Neck: Normal range of motion.  Cardiovascular:  Pulses equal bilaterally  Musculoskeletal: He exhibits edema and tenderness.       Feet:  Pain with edema and mild erythema noted at left proximal great toe.  Dorsalis pedis pulse normal.  No rash or skin lesions.   Neurological: He is alert. He has normal strength. He displays normal reflexes. No sensory deficit.  Equal strength  Skin: Skin is  warm and dry.  Psychiatric: He has a normal mood and affect.    ED Course  Procedures (including critical care time)  Labs Reviewed - No data to display No results found.   1. Gouty arthritis of toe, left       MDM  Pt prescribed ibuprofen (has no more aleve at home).  Prescribed oxycodone.  Elevation, warm compresses.  Recheck prn if not improved.  Referrals for pcp given.        Burgess Amor, PA 06/18/12 1158

## 2012-06-18 NOTE — ED Provider Notes (Signed)
Medical screening examination/treatment/procedure(s) were performed by non-physician practitioner and as supervising physician I was immediately available for consultation/collaboration.   Joseph L Zammit, MD 06/18/12 1436 

## 2012-06-25 ENCOUNTER — Emergency Department (HOSPITAL_COMMUNITY)
Admission: EM | Admit: 2012-06-25 | Discharge: 2012-06-25 | Disposition: A | Discharge status: Home / Self Care | Payer: Medicaid Other | Attending: Emergency Medicine | Admitting: Emergency Medicine

## 2012-06-25 ENCOUNTER — Encounter (HOSPITAL_COMMUNITY): Payer: Self-pay | Admitting: *Deleted

## 2012-06-25 DIAGNOSIS — Z872 Personal history of diseases of the skin and subcutaneous tissue: Secondary | ICD-10-CM | POA: Insufficient documentation

## 2012-06-25 DIAGNOSIS — M109 Gout, unspecified: Secondary | ICD-10-CM | POA: Insufficient documentation

## 2012-06-25 DIAGNOSIS — M79609 Pain in unspecified limb: Secondary | ICD-10-CM | POA: Insufficient documentation

## 2012-06-25 DIAGNOSIS — F17200 Nicotine dependence, unspecified, uncomplicated: Secondary | ICD-10-CM | POA: Insufficient documentation

## 2012-06-25 MED ORDER — PREDNISONE 50 MG PO TABS
60.0000 mg | ORAL_TABLET | Freq: Once | ORAL | Status: AC
  Administered 2012-06-25: 60 mg via ORAL
  Filled 2012-06-25: qty 1

## 2012-06-25 MED ORDER — OXYCODONE-ACETAMINOPHEN 5-325 MG PO TABS: 1.0000 | ORAL_TABLET | ORAL | Status: DC | PRN

## 2012-06-25 MED ORDER — OXYCODONE-ACETAMINOPHEN 5-325 MG PO TABS
1.0000 | ORAL_TABLET | Freq: Once | ORAL | Status: AC
  Administered 2012-06-25: 1 via ORAL
  Filled 2012-06-25: qty 1

## 2012-06-25 MED ORDER — PREDNISONE 20 MG PO TABS: 60.0000 mg | ORAL_TABLET | Freq: Every day | ORAL | Status: DC

## 2012-06-25 NOTE — ED Notes (Signed)
Pt states he ran out of percocet the other day

## 2012-06-25 NOTE — ED Notes (Signed)
Pt states dx with gout to left foot and was seen here, was instructed to come back if pain did not improve

## 2012-06-25 NOTE — ED Provider Notes (Signed)
History    This chart was scribed for Steven Booze, MD by Gerlean Ren, ED Scribe. This patient was seen in room [facility]07/[facility]07 and the patient's care was started at 4:38 PM    CSN: [id number]  Arrival date & time 06/25/12  1614   First MD Initiated Contact with Patient 06/25/12 1635      Chief Complaint  Patient presents with  . Foot Pain  . Gout    The history is provided by the patient. No language interpreter was used.  [name] is a 27 y.o. male who presents to the Emergency Department complaining of left foot gout pain attack that feels typical to h/o gout.  Pain is sharp, stabbing, and rated as 8/10.  Pain worsened when bearing weight and not improved by heat or ice.  Pt reported here 9 days ago for same and was told to return if pain did not resolve.  Pain only mildly improved for a short period of time by ibuprofen given here 9 days ago.  Pt reports gout attacks normally occur in hands and have not occurred in feet for an extended period of time.  Pt is a current everyday smoker and reports rare alcohol use.    Past Medical History  Diagnosis Date  . Gout   . Cellulitis     Past Surgical History  Procedure Laterality Date  . Tonsillectomy      Family History  Problem Relation Age of Onset  . Heart failure Mother     History  Substance Use Topics  . Smoking status: Current Every Day Smoker -- 0.50 packs/day for 7 years    Types: Cigarettes  . Smokeless tobacco: Former Neurosurgeon    Types: Snuff  . Alcohol Use: Yes     Comment: very occasional      Review of Systems  Musculoskeletal:       Left foot gout pain  All other systems reviewed and are negative.    Allergies  Asa; Penicillins; and Sulfa antibiotics  Home Medications   Current Outpatient Rx  Name  Route  Sig  Dispense  Refill  . ibuprofen (ADVIL,MOTRIN) 800 MG tablet   Oral   Take 1 tablet (800 mg total) by mouth 3 (three) times daily.   21 tablet   0   . naproxen sodium (ALEVE) 220 MG  tablet   Oral   Take 440 mg by mouth daily as needed (pain). Patient took 3 tablets then took 2 additional tablets one hour later         . oxyCODONE-acetaminophen (PERCOCET/ROXICET) 5-325 MG per tablet   Oral   Take 1 tablet by mouth every 4 (four) hours as needed for pain.   20 tablet   0     BP 154/109  Pulse 100  Temp(Src) 103 F (39.4 C)  Resp 22  Ht 5\' 7"  (1.702 m)  Wt 430 lb (195.047 kg)  BMI 67.33 kg/m2  SpO2 97%  Physical Exam  Nursing note and vitals reviewed. Constitutional: He is oriented to person, place, and time. He appears well-developed and well-nourished.  Appears uncomfortable, morbidly obese  HENT:  Head: Normocephalic and atraumatic.  Eyes: EOM are normal.  Neck: Neck supple. No tracheal deviation present.  Cardiovascular: Normal rate.   Pulmonary/Chest: Effort normal. No respiratory distress.  Musculoskeletal: Normal range of motion.  Swelling, erythema, and tenderness of left foot with maximum tenderness over the left first MTP joint consistent with gout.  Neurological: He is alert and  oriented to person, place, and time.  Skin: Skin is warm and dry.  Psychiatric: He has a normal mood and affect. His behavior is normal.    ED Course  Procedures (including critical care time) DIAGNOSTIC STUDIES: Oxygen Saturation is 97% on room air, adequate by my interpretation.    COORDINATION OF CARE: 4:45 PM- Discussed with pt importance of finding PCP to help start medication to help control gout.  Pt understands and agrees.   1. Gout flare   2. Morbid obesity       MDM  Exacerbation of gout. Although he is febrile, he does not have clinical findings to suggest infection in his foot. He had an inadequate response to NSAIDs so he will be given a course of prednisone as well as a prescription for Percocet. Old records are reviewed and he has multiple ED visits for gout and has had a significantly elevated uric acid level in the past. He would probably  be a good candidate for allopurinol therapy. I've advised of this as well as the need to obtain a PCP to monitor her allopurinol therapy. It would be inappropriate to start allopurinol now during an acute attack and this is explained to him as well. Importance of weight loss was also explained.  I personally performed the services described in this documentation, which was scribed in my presence. The recorded information has been reviewed and is accurate.            Steven Booze, MD 06/25/12 [phone]

## 2012-09-01 ENCOUNTER — Encounter (HOSPITAL_COMMUNITY)
Admission: EM | Disposition: A | Discharge status: Home / Self Care | Payer: Self-pay | Source: Home / Self Care | Attending: Internal Medicine

## 2012-09-01 ENCOUNTER — Inpatient Hospital Stay (HOSPITAL_COMMUNITY)
Admission: EM | Admit: 2012-09-01 | Discharge: 2012-09-07 | DRG: 378 | Disposition: A | Discharge status: Home / Self Care | Payer: Medicaid Other | Attending: Internal Medicine | Admitting: Internal Medicine

## 2012-09-01 ENCOUNTER — Encounter (HOSPITAL_COMMUNITY): Payer: Self-pay

## 2012-09-01 DIAGNOSIS — K2210 Ulcer of esophagus without bleeding: Secondary | ICD-10-CM

## 2012-09-01 DIAGNOSIS — M109 Gout, unspecified: Secondary | ICD-10-CM | POA: Diagnosis present

## 2012-09-01 DIAGNOSIS — Z6841 Body Mass Index (BMI) 40.0 and over, adult: Secondary | ICD-10-CM

## 2012-09-01 DIAGNOSIS — K2289 Other specified disease of esophagus: Secondary | ICD-10-CM | POA: Diagnosis present

## 2012-09-01 DIAGNOSIS — D62 Acute posthemorrhagic anemia: Secondary | ICD-10-CM | POA: Diagnosis present

## 2012-09-01 DIAGNOSIS — K2100 Gastro-esophageal reflux disease with esophagitis, without bleeding: Secondary | ICD-10-CM | POA: Diagnosis present

## 2012-09-01 DIAGNOSIS — R000 Tachycardia, unspecified: Secondary | ICD-10-CM | POA: Diagnosis present

## 2012-09-01 DIAGNOSIS — Z886 Allergy status to analgesic agent status: Secondary | ICD-10-CM

## 2012-09-01 DIAGNOSIS — K922 Gastrointestinal hemorrhage, unspecified: Secondary | ICD-10-CM

## 2012-09-01 DIAGNOSIS — Z882 Allergy status to sulfonamides status: Secondary | ICD-10-CM

## 2012-09-01 DIAGNOSIS — D649 Anemia, unspecified: Secondary | ICD-10-CM

## 2012-09-01 DIAGNOSIS — F17200 Nicotine dependence, unspecified, uncomplicated: Secondary | ICD-10-CM | POA: Diagnosis present

## 2012-09-01 DIAGNOSIS — Z8249 Family history of ischemic heart disease and other diseases of the circulatory system: Secondary | ICD-10-CM

## 2012-09-01 DIAGNOSIS — M79609 Pain in unspecified limb: Secondary | ICD-10-CM | POA: Diagnosis present

## 2012-09-01 DIAGNOSIS — K228 Other specified diseases of esophagus: Secondary | ICD-10-CM | POA: Diagnosis present

## 2012-09-01 DIAGNOSIS — E876 Hypokalemia: Secondary | ICD-10-CM | POA: Diagnosis present

## 2012-09-01 DIAGNOSIS — T3995XA Adverse effect of unspecified nonopioid analgesic, antipyretic and antirheumatic, initial encounter: Secondary | ICD-10-CM | POA: Diagnosis present

## 2012-09-01 DIAGNOSIS — K254 Chronic or unspecified gastric ulcer with hemorrhage: Secondary | ICD-10-CM

## 2012-09-01 DIAGNOSIS — Z79899 Other long term (current) drug therapy: Secondary | ICD-10-CM

## 2012-09-01 DIAGNOSIS — Z880 Allergy status to penicillin: Secondary | ICD-10-CM

## 2012-09-01 DIAGNOSIS — I10 Essential (primary) hypertension: Secondary | ICD-10-CM

## 2012-09-01 DIAGNOSIS — K920 Hematemesis: Secondary | ICD-10-CM | POA: Diagnosis present

## 2012-09-01 DIAGNOSIS — F17210 Nicotine dependence, cigarettes, uncomplicated: Secondary | ICD-10-CM

## 2012-09-01 HISTORY — DX: Obesity, unspecified: E66.9

## 2012-09-01 HISTORY — DX: Ulcer of esophagus without bleeding: K22.10

## 2012-09-01 HISTORY — PX: ESOPHAGOGASTRODUODENOSCOPY: SHX5428

## 2012-09-01 HISTORY — DX: Chronic or unspecified gastric ulcer with hemorrhage: K25.4

## 2012-09-01 HISTORY — DX: Acute posthemorrhagic anemia: D62

## 2012-09-01 LAB — CBC WITH DIFFERENTIAL/PLATELET
Basophils Absolute: 0 10*3/uL (ref 0.0–0.1)
Basophils Relative: 0 % (ref 0–1)
Eosinophils Absolute: 0.1 10*3/uL (ref 0.0–0.7)
Hemoglobin: 11.7 g/dL — ABNORMAL LOW (ref 13.0–17.0)
MCH: 28 pg (ref 26.0–34.0)
MCHC: 32.5 g/dL (ref 30.0–36.0)
Neutro Abs: 7.7 10*3/uL (ref 1.7–7.7)
Neutrophils Relative %: 72 % (ref 43–77)
Platelets: 355 10*3/uL (ref 150–400)
RDW: 13.2 % (ref 11.5–15.5)

## 2012-09-01 LAB — BASIC METABOLIC PANEL
BUN: 27 mg/dL — ABNORMAL HIGH (ref 6–23)
Chloride: 101 mEq/L (ref 96–112)
GFR calc Af Amer: >90 mL/min (ref >90)
GFR calc non Af Amer: >90 mL/min (ref >90)
Potassium: 4.1 mEq/L (ref 3.5–5.1)
Sodium: 139 mEq/L (ref 135–145)

## 2012-09-01 LAB — CBC
Hemoglobin: 10.6 g/dL — ABNORMAL LOW (ref 13.0–17.0)
MCHC: 32.6 g/dL (ref 30.0–36.0)
MCHC: 32.9 g/dL (ref 30.0–36.0)
MCV: 86.2 fL (ref 78.0–100.0)
MCV: 85.9 fL (ref 78.0–100.0)
Platelets: 341 10*3/uL (ref 150–400)
Platelets: 320 10*3/uL (ref 150–400)
Platelets: 307 10*3/uL (ref 150–400)
RBC: 3.74 MIL/uL — ABNORMAL LOW (ref 4.22–5.81)
RBC: 3.62 MIL/uL — ABNORMAL LOW (ref 4.22–5.81)
RDW: 13.3 % (ref 11.5–15.5)
WBC: 9.8 10*3/uL (ref 4.0–10.5)
WBC: 8.9 10*3/uL (ref 4.0–10.5)

## 2012-09-01 LAB — MRSA PCR SCREENING: MRSA by PCR: NEGATIVE

## 2012-09-01 LAB — APTT: aPTT: 30 seconds (ref 24–37)

## 2012-09-01 LAB — PREPARE RBC (CROSSMATCH)

## 2012-09-01 LAB — PROTIME-INR: Prothrombin Time: 13.7 seconds (ref 11.6–15.2)

## 2012-09-01 SURGERY — EGD (ESOPHAGOGASTRODUODENOSCOPY)
Anesthesia: Moderate Sedation | Laterality: Left

## 2012-09-01 MED ORDER — MIDAZOLAM HCL 5 MG/5ML IJ SOLN
INTRAMUSCULAR | Status: DC | PRN
  Administered 2012-09-01 (×2): 2 mg via INTRAVENOUS

## 2012-09-01 MED ORDER — STERILE WATER FOR IRRIGATION IR SOLN
Status: DC | PRN
  Administered 2012-09-01: 15:00:00

## 2012-09-01 MED ORDER — MEPERIDINE HCL 100 MG/ML IJ SOLN
INTRAMUSCULAR | Status: DC | PRN
  Administered 2012-09-01 (×2): 50 mg

## 2012-09-01 MED ORDER — SODIUM CHLORIDE 0.9 % IV BOLUS (SEPSIS)
1000.0000 mL | Freq: Once | INTRAVENOUS | Status: AC
  Administered 2012-09-01: 1000 mL via INTRAVENOUS

## 2012-09-01 MED ORDER — PANTOPRAZOLE SODIUM 40 MG IV SOLR
40.0000 mg | Freq: Once | INTRAVENOUS | Status: AC
  Administered 2012-09-01: 40 mg via INTRAVENOUS
  Filled 2012-09-01: qty 40

## 2012-09-01 MED ORDER — PANTOPRAZOLE SODIUM 40 MG IV SOLR
8.0000 mg/h | INTRAVENOUS | Status: DC
  Administered 2012-09-01 – 2012-09-04 (×7): 8 mg/h via INTRAVENOUS
  Filled 2012-09-01 (×10): qty 80

## 2012-09-01 MED ORDER — SODIUM CHLORIDE 0.9 % IV SOLN: INTRAVENOUS | Status: DC

## 2012-09-01 MED ORDER — FENTANYL CITRATE 0.05 MG/ML IJ SOLN
50.0000 ug | Freq: Once | INTRAMUSCULAR | Status: AC
  Administered 2012-09-01: 50 ug via INTRAVENOUS
  Filled 2012-09-01: qty 2

## 2012-09-01 MED ORDER — BUTAMBEN-TETRACAINE-BENZOCAINE 2-2-14 % EX AERO
INHALATION_SPRAY | CUTANEOUS | Status: DC | PRN
  Administered 2012-09-01 (×2): 1 via TOPICAL

## 2012-09-01 MED ORDER — ONDANSETRON HCL 4 MG PO TABS: 4.0000 mg | ORAL_TABLET | Freq: Four times a day (QID) | ORAL | Status: DC | PRN

## 2012-09-01 MED ORDER — ONDANSETRON HCL 4 MG/2ML IJ SOLN
INTRAMUSCULAR | Status: DC | PRN
  Administered 2012-09-01: 4 mg via INTRAVENOUS

## 2012-09-01 MED ORDER — ACETAMINOPHEN 325 MG PO TABS
650.0000 mg | ORAL_TABLET | Freq: Four times a day (QID) | ORAL | Status: DC | PRN
  Administered 2012-09-01 – 2012-09-03 (×4): 650 mg via ORAL
  Filled 2012-09-01 (×4): qty 2

## 2012-09-01 MED ORDER — MORPHINE SULFATE 2 MG/ML IJ SOLN
2.0000 mg | INTRAMUSCULAR | Status: DC | PRN
  Administered 2012-09-01 – 2012-09-06 (×22): 2 mg via INTRAVENOUS
  Filled 2012-09-01 (×23): qty 1

## 2012-09-01 MED ORDER — ACETAMINOPHEN 650 MG RE SUPP: 650.0000 mg | Freq: Four times a day (QID) | RECTAL | Status: DC | PRN

## 2012-09-01 MED ORDER — SODIUM CHLORIDE 0.9 % IJ SOLN
3.0000 mL | Freq: Two times a day (BID) | INTRAMUSCULAR | Status: DC
  Administered 2012-09-02 – 2012-09-06 (×8): 3 mL via INTRAVENOUS

## 2012-09-01 MED ORDER — SODIUM CHLORIDE 0.9 % IV SOLN
INTRAVENOUS | Status: DC
  Administered 2012-09-01 – 2012-09-02 (×3): via INTRAVENOUS

## 2012-09-01 MED ORDER — COLCHICINE 0.6 MG PO TABS
0.6000 mg | ORAL_TABLET | Freq: Three times a day (TID) | ORAL | Status: DC
  Administered 2012-09-01 – 2012-09-03 (×7): 0.6 mg via ORAL
  Filled 2012-09-01 (×7): qty 1

## 2012-09-01 MED ORDER — ONDANSETRON HCL 4 MG/2ML IJ SOLN
4.0000 mg | Freq: Four times a day (QID) | INTRAMUSCULAR | Status: DC | PRN
  Filled 2012-09-01: qty 2

## 2012-09-01 MED ORDER — MEPERIDINE HCL 100 MG/ML IJ SOLN
INTRAMUSCULAR | Status: AC
  Filled 2012-09-01: qty 3

## 2012-09-01 MED ORDER — EPINEPHRINE HCL 0.1 MG/ML IJ SOSY
PREFILLED_SYRINGE | INTRAMUSCULAR | Status: AC
  Filled 2012-09-01: qty 30

## 2012-09-01 MED ORDER — MIDAZOLAM HCL 5 MG/5ML IJ SOLN
INTRAMUSCULAR | Status: AC
  Filled 2012-09-01: qty 20

## 2012-09-01 MED ORDER — ETHANOLAMINE OLEATE 5 % IV SOLN
INTRAVENOUS | Status: AC
  Filled 2012-09-01: qty 4

## 2012-09-01 NOTE — Op Note (Signed)
NAMEJOZSEF, [name]             ACCOUNT NO.:  [id number]  MEDICAL RECORD NO.:  [id number]  LOCATION:  IC05                          FACILITY:  [facility]  PHYSICIAN:  R. Roetta Sessions, MD FACP FACGDATE OF BIRTH:  1986-03-09  DATE OF PROCEDURE:  09/01/2012 DATE OF DISCHARGE:                              OPERATIVE REPORT   PROCEDURE:  Bedside ICU EGD with bleeding control therapy.  INDICATIONS FOR PROCEDURE:  A 27 year old, morbidly obese gentleman with recent history of high-dose NSAID therapy for gout presented with hematemesis today.  Bedside ICU EGD now being done.  Risks, benefits, limitations, alternatives, and imponderables have been discussed.  Please see the documentation medical record.  PROCEDURE NOTE:  O2 saturation, blood pressure, pulse, respirations were monitored throughout the entire procedure.  CONSCIOUS SEDATION:  Versed 4 mg IV and Demerol 100 mg IV, Zofran 4 mg IV.  Cetacaine spray.  INSTRUMENT:  Pentax video chip system.  FINDINGS:  Examination of tubular esophagus revealed circumferential distal esophageal erosions, two linear erosions coming up 2 cm into the distal esophagus.  There was no Barrett esophagus.  EG junction easily traversed.  Stomach was basically empty.  It was insufflated well with air. Thorough examination of gastric mucosa including retroflexed view of the proximal stomach, esophagogastric junction demonstrated a 6-mm ulcer in the antrum.  It had a small protuberant clot in the base of the crater. Please see photos.  This was a benign-appearing lesion.  Otherwise, remainder of the gastric mucosa appeared normal.  Pylorus was patent, easily traversed.  Examination of bulb and second portion revealed no abnormalities.  THERAPEUTIC/DIAGNOSTIC MANEUVERS PERFORMED:  The periphery of the ulcer crater was injected with a total of 4 mL of 1:10,000 epinephrine in circumferential fashion through a 7-French Gold probe needle.  Subsequently, this  lesion was sealed thermally with multiple applications of the Gold probe at 20 joules each.  I ultimately placed 3 resolution clips as well (2 were suboptimally placed an third one was placed in excellent position on either side of the ulcer crater).  The patient tolerated the procedure Well -  was reactived in the ICU.  IMPRESSION: 1. Distal esophageal erosions consistent with erosive reflux     esophagitis. 2. Pre-pyloric benign-appearing gastric ulcer with bleeding stigmata     status post bleeding control therapy as described above.  RECOMMENDATIONS: 1. IV PPI infusion for 72 hours. 2. Clear liquid diet. 3. Check H. pylori serologies. 4. Repeat EGD in 3 months to verify healing. 5. NSAID agents are absolutely contraindicated.  I have discussed my findings with Dr. Irene Limbo and multiple family members.     Steven Bellows, MD FACP [facility]     RMR/MEDQ  D:  09/01/2012  T:  09/01/2012  Job:  [id number]

## 2012-09-01 NOTE — H&P (Addendum)
History and Physical  [name] ZOX:[id number] DOB: 04/18/1985 DOA: 09/01/2012  Referring physician: Dr. Bebe Shaggy PCP: No primary provider on file. None  Chief Complaint: Vomiting blood  HPI:  27 year old man presented to the emergency department with history of hematemesis today. Initial evaluation was notable for tachycardia, hemoglobin 11.7, fecal occult blood positive. Referred for admission.  History obtained from patient at bedside. He has a history of recurrent gout and takes Aleve twice a day every day chronically. He developed gouty flare left foot 5/15 and in addition to Aleve he took approximately 30 tablets of ibuprofen over the last 3 days. He has otherwise felt well. This morning he got up to go to bathroom and suddenly vomited up blood clots. He had 2 more episodes of hematemesis vomiting clots in rapid succession. He denies any abdominal pain, chest pain or trouble breathing. He has not had previous chest pain. He has no history of GERD symptoms. He has had no bleeding in the emergency department and no pain.  In the emergency department was noted to be orthostatic, tachycardic but normotensive and without hypoxia. Clinically appeared stable. Initial hemoglobin 11.7. Other screening laboratory studies unremarkable. He was treated with IV Protonix, fentanyl, IV fluid bolus 2 L and started on Protonix infusion.  Review of Systems:  Negative for fever, visual changes, sore throat, rash, chest pain, SOB, dysuria, abdominal pain, diarrhea.  Past Medical History  Diagnosis Date  . Gout   . Cellulitis   . Obesity     Past Surgical History  Procedure Laterality Date  . Tonsillectomy      Social History:  reports that he has been smoking Cigarettes.  He has a 3.5 pack-year smoking history. He has quit using smokeless tobacco. His smokeless tobacco use included Snuff. He reports that  drinks alcohol. He reports that he does not use illicit drugs.  Allergies  Allergen  Reactions  . Asa (Aspirin)     Pt doesn't know reaction.  Marland Kitchen Penicillins     Pt doesn't know reaction.  . Sulfa Antibiotics     Pt doesn't know reaction.    Family History  Problem Relation Age of Onset  . Heart failure Mother      Prior to Admission medications   Medication Sig Start Date End Date Taking? Authorizing Provider  ibuprofen (ADVIL,MOTRIN) 800 MG tablet Take 1 tablet (800 mg total) by mouth 3 (three) times daily. 06/16/12  Yes Raynelle Fanning Idol, PA-C  naproxen sodium (ALEVE) 220 MG tablet Take 440 mg by mouth daily as needed (pain). Patient took 3 tablets then took 2 additional tablets one hour later   Yes Historical Provider, MD   Physical Exam: Filed Vitals:   09/01/12 1117 09/01/12 1126 09/01/12 1128 09/01/12 1131  BP: 116/82 105/54 104/61 126/63  Pulse: 111 108 109 149  Temp:      TempSrc:      Resp: 15     SpO2: 97%       General: Examined in the emergency department. Appears calm and comfortable. Well-appearing. Eyes: PERRL, normal lids, irises. Wears glasses. ENT: grossly normal hearing, lips. Pierced tongue. Neck: no LAD, masses or thyromegaly Cardiovascular: Tachycardic, regular rhythm, no murmur, rub or gallop. No LE edema. Respiratory: CTA bilaterally, no w/r/r. Normal respiratory effort. Abdomen: soft, ntnd, obese Skin: no rash or induration seen  Musculoskeletal: grossly normal tone BUE/BLE. Left foot without erythema or obvious edema. Generally tender to palpation over the dorsum of the foot. Psychiatric: grossly normal mood and affect,  speech fluent and appropriate Neurologic: grossly non-focal.  Wt Readings from Last 3 Encounters:  06/25/12 195.047 kg (430 lb)  06/16/12 199.129 kg (439 lb)  05/03/12 202.758 kg (447 lb)    Labs on Admission:  Basic Metabolic Panel:  Recent Labs Lab 09/01/12 1040  NA 139  K 4.1  CL 101  CO2 27  GLUCOSE 114*  BUN 27*  CREATININE 0.59  CALCIUM 9.0   CBC:  Recent Labs Lab 09/01/12 1040  WBC 10.7*   NEUTROABS 7.7  HGB 11.7*  HCT 36.0*  MCV 86.1  PLT 355   Radiological Exams on Admission: No results found.  EKG: Independently reviewed. Sinus tachycardia. No acute changes.   Principal Problem:   Hematemesis Active Problems:   Normocytic anemia   Acute gouty arthritis   Morbid obesity   Assessment/Plan 1. Hematemesis, suspected upper GI bleed: Suspect peptic ulcer disease secondary to NSAID overuse. IV Protonix infusion, GI consultation, IV fluids, serial CBC. Type and screen pending. He has no abdominal pain, hold off on imaging at this point.  2. Normocytic anemia: Suspect acute blood loss anemia. Plan as above. 3. Acute gouty arthritis: Trial colchicine. Pain control. 4. Morbid obesity: Dietitian consult. 5. Cigarette smoker: He is trying to quit. Cessation will be recommended.  Currently appears stable. No hematemesis or signs of bleeding since presentation. Admit to step down.  Code Status: Full code Family Communication: Discussed with stepfather and mother at bedside Disposition Plan/Anticipated LOS: Admit. 48 hours.  Time spent: 55 minutes  Brendia Sacks, MD  [facility] Pager [phone] 09/01/2012, 12:15 PM

## 2012-09-01 NOTE — ED Provider Notes (Signed)
History  This chart was scribed for Joya Gaskins, MD by Bennett Scrape, ED Scribe. This patient was seen in room APA18/APA18 and the patient's care was started at 10:09 AM.  CSN: [id number]  Arrival date & time 09/01/12  1005   First MD Initiated Contact with Patient 09/01/12 1009      Chief Complaint  Patient presents with  . Hematemesis     Patient is a 27 y.o. male presenting with vomiting. The history is provided by the patient. No language interpreter was used.  Emesis Severity:  Moderate Timing:  Constant Number of daily episodes:  4 Quality:  Bright red blood Progression:  Unchanged Chronicity:  New Recent urination:  Normal Context: not post-tussive   Relieved by:  Nothing Worsened by:  Nothing tried Ineffective treatments:  None tried Associated symptoms: no abdominal pain and no diarrhea     HPI Comments: [name] is a 27 y.o. male brought in by ambulance, who presents to the Emergency Department complaining of 4 episodes of hematemesis described as a hand full of blood clots upon waking this morning. He denies any recent falls or trauma. Pt denies having prior episodes of similar symptoms. He admits that he has been taking between 7 to 12 ibuprofen to improve his gout episodes stating that he can't afford his regular gout medication. He denies being on anticoagulants currently. He denies having a h/o ulcers or liver disease. He denies illegal drug use and daily alcohol use.  He denies syncope, CP, SOB, abdominal pain, hematochezia, melena as associated symptoms.  Past Medical History  Diagnosis Date  . Gout   . Cellulitis   . Obesity     Past Surgical History  Procedure Laterality Date  . Tonsillectomy      Family History  Problem Relation Age of Onset  . Heart failure Mother     History  Substance Use Topics  . Smoking status: Current Every Day Smoker -- 0.50 packs/day for 7 years    Types: Cigarettes  . Smokeless tobacco: Former Neurosurgeon     Types: Snuff  . Alcohol Use: Yes     Comment: very occasional      Review of Systems  HENT: Negative for nosebleeds.   Respiratory: Negative for shortness of breath.   Cardiovascular: Negative for chest pain.  Gastrointestinal: Positive for nausea and vomiting. Negative for abdominal pain and diarrhea.  All other systems reviewed and are negative.    Allergies  Asa; Penicillins; and Sulfa antibiotics  Home Medications   Current Outpatient Rx  Name  Route  Sig  Dispense  Refill  . ibuprofen (ADVIL,MOTRIN) 800 MG tablet   Oral   Take 1 tablet (800 mg total) by mouth 3 (three) times daily.   21 tablet   0   . naproxen sodium (ALEVE) 220 MG tablet   Oral   Take 440 mg by mouth daily as needed (pain). Patient took 3 tablets then took 2 additional tablets one hour later         . oxyCODONE-acetaminophen (PERCOCET/ROXICET) 5-325 MG per tablet   Oral   Take 1 tablet by mouth every 4 (four) hours as needed for pain.   20 tablet   0   . oxyCODONE-acetaminophen (PERCOCET/ROXICET) 5-325 MG per tablet   Oral   Take 1 tablet by mouth every 4 (four) hours as needed for pain.   20 tablet   0   . predniSONE (DELTASONE) 20 MG tablet   Oral  Take 3 tablets (60 mg total) by mouth daily.   15 tablet   0     Triage Vitals: BP 107/85  Pulse 126  Temp(Src) 98.4 F (36.9 C) (Oral)  SpO2 98% BP 126/63  Pulse 149  Temp(Src) 98.4 F (36.9 C) (Oral)  Resp 15  SpO2 97%   Physical Exam  Nursing note and vitals reviewed.  CONSTITUTIONAL: Well developed/well nourished HEAD: Normocephalic/atraumatic EYES: EOMI/PERRL ENMT: Mucous membranes dry, no blood in nares NECK: supple no meningeal signs SPINE:entire spine nontender CV: S1/S2 noted, no murmurs/rubs/gallops noted LUNGS: Lungs are clear to auscultation bilaterally, no apparent distress ABDOMEN: soft, nontender, no rebound or guarding, pt is obese GU:no cva tenderness RECTAL: stool color is greenish, chaperone  present NEURO: Pt is awake/alert, moves all extremitiesx4 EXTREMITIES: pulses normal, full ROM SKIN: warm, color normal PSYCH: no abnormalities of mood noted  ED Course  Procedures   CRITICAL CARE Performed by: Joya Gaskins Total critical care time: 31 Critical care time was exclusive of separately billable procedures and treating other patients. Critical care was necessary to treat or prevent imminent or life-threatening deterioration. Critical care was time spent personally by me on the following activities: development of treatment plan with patient and/or surrogate as well as nursing, discussions with consultants, evaluation of patient's response to treatment, examination of patient, obtaining history from patient or surrogate, ordering and performing treatments and interventions, ordering and review of laboratory studies, ordering and review of radiographic studies, pulse oximetry and re-evaluation of patient's condition.   Medications  sodium chloride 0.9 % bolus 1,000 mL (1,000 mLs Intravenous New Bag/Given 09/01/12 1031)  pantoprazole (PROTONIX) injection 40 mg (not administered)  fentaNYL (SUBLIMAZE) injection 50 mcg (50 mcg Intravenous Given 09/01/12 1030)    DIAGNOSTIC STUDIES: Oxygen Saturation is 98% on room air, normal by my interpretation.    COORDINATION OF CARE: 10:19 AM-Discussed treatment plan which includes medications, CBC panel, BMP and INR with pt at bedside and pt agreed to plan.   Pt with recent heavy NSAID use, now with hematemesis, tachycardia.  Concern for GI bleed Will follow closely  11:22 AM-Pt rechecked and feels improved with medications listed above. 12:02 PM Pt with significant tachycardia though some of this is due to his gouty pain Concern for GI bleed given recent NSAID use/tachycardia and hematemesis D/w dr Irene Limbo who will admit to stepdown D/w dr Jena Gauss with GI who will see patient in hospital Pt stabilized in the ED  MDM  Nursing  notes including past medical history and social history reviewed and considered in documentation Labs/vital reviewed and considered     Date: 09/01/2012  Rate: 122  Rhythm: sinus tachycardia  QRS Axis: normal  Intervals: normal  ST/T Wave abnormalities: nonspecific ST changes  Conduction Disutrbances:none  Narrative Interpretation: artifact noted      I personally performed the services described in this documentation, which was scribed in my presence. The recorded information has been reviewed and is accurate.         Joya Gaskins, MD 09/01/12 [phone]

## 2012-09-01 NOTE — ED Notes (Signed)
ICU to call when they have bariatric bed in room.

## 2012-09-01 NOTE — ED Notes (Signed)
Pt reports walking to bathroom and became nauseated and vomited x4 this am, large amount of black clots.  No h/o of gi bleed.

## 2012-09-01 NOTE — Progress Notes (Signed)
Consents for EGD signed and placed in chart.

## 2012-09-01 NOTE — Consult Note (Signed)
Referring Provider: No ref. provider found Primary Care Physician:  No primary provider on file. Primary Gastroenterologist:    Reason for Consultation:  Hematemesis   HPI: 27 year old morbidly obese male admitted through the emergency department this afternoon after presenting with multiple episodes of bloody emesis this morning. He has not had any associated abdominal pain or reflux symptoms recently. He readily admits to taking OTC NSAID agents-multiple times daily recently for flare of gout. He has not had any melena or hematochezia. He was found to be occult blood positive on digital rectal exam per Dr. Bebe Shaggy in the ED today.  He presented with a tachycardia but has not been hypotensive. Admitting hemoglobin 11.7; old baseline 14.3 Patient denies a prior history of gastrointestinal disorder. No family history of GI illness. Only rarely consumes an alcoholic beverage. He does dip snuff. He denies illicit drug use.  Past Medical History  Diagnosis Date  . Gout   . Cellulitis   . Obesity     Past Surgical History  Procedure Laterality Date  . Tonsillectomy      Prior to Admission medications   Medication Sig Start Date End Date Taking? Authorizing Provider  ibuprofen (ADVIL,MOTRIN) 800 MG tablet Take 1 tablet (800 mg total) by mouth 3 (three) times daily. 06/16/12  Yes Raynelle Fanning Idol, PA-C  naproxen sodium (ALEVE) 220 MG tablet Take 440 mg by mouth daily as needed (pain). Patient took 3 tablets then took 2 additional tablets one hour later   Yes Historical Provider, MD    Current Facility-Administered Medications  Medication Dose Route Frequency Provider Last Rate Last Dose  . 0.9 %  sodium chloride infusion   Intravenous Continuous Standley Brooking, MD 100 mL/hr at 09/01/12 1309    . acetaminophen (TYLENOL) tablet 650 mg  650 mg Oral Q6H PRN Standley Brooking, MD       Or  . acetaminophen (TYLENOL) suppository 650 mg  650 mg Rectal Q6H PRN Standley Brooking, MD      . colchicine  tablet 0.6 mg  0.6 mg Oral TID Standley Brooking, MD   0.6 mg at 09/01/12 1309  . morphine 2 MG/ML injection 2 mg  2 mg Intravenous Q4H PRN Standley Brooking, MD      . ondansetron [facility]) tablet 4 mg  4 mg Oral Q6H PRN Standley Brooking, MD       Or  . ondansetron [facility]) injection 4 mg  4 mg Intravenous Q6H PRN Standley Brooking, MD      . pantoprazole (PROTONIX) 80 mg in sodium chloride 0.9 % 250 mL infusion  8 mg/hr Intravenous Continuous Standley Brooking, MD 25 mL/hr at 09/01/12 1258 8 mg/hr at 09/01/12 1258  . sodium chloride 0.9 % injection 3 mL  3 mL Intravenous Q12H Standley Brooking, MD        Allergies as of 09/01/2012 - Review Complete 09/01/2012  Allergen Reaction Noted  . Asa (aspirin)  03/05/2012  . Penicillins  03/05/2012  . Sulfa antibiotics  03/05/2012    Family History  Problem Relation Age of Onset  . Heart failure Mother     History   Social History  . Marital Status: Single    Spouse Name: N/A    Number of Children: N/A  . Years of Education: N/A   Occupational History  . Not on file.   Social History Main Topics  . Smoking status: Current Every Day Smoker -- 0.50 packs/day for 7 years  Types: Cigarettes  . Smokeless tobacco: Former Neurosurgeon    Types: Snuff  . Alcohol Use: Yes     Comment: very occasional  . Drug Use: No  . Sexually Active: No   Other Topics Concern  . Not on file   Social History Narrative  . No narrative on file    Review of Systems: Gen: Denies any fever, chills, sweats, anorexia, fatigue, weakness, malaise, weight loss, and sleep disorder CV: Denies chest pain, angina, palpitations, syncope, orthopnea, PND, peripheral edema, and claudication. Resp: Denies dyspnea at rest, dyspnea with exercise, cough, sputum, wheezing, coughing up blood, and pleurisy. GI:  jaundice, and fecal incontinence.   Denies dysphagia or odynophagia. Derm: Denies rash, itching, dry skin, hives, moles, warts, or unhealing ulcers.  Psych: Denies  depression, anxiety, memory loss, suicidal ideation, hallucinations, paranoia, and confusion. Heme: Denies bruising, bleeding, and enlarged lymph nodes.   Physical Exam: Vital signs in last 24 hours: Temp:  [98.4 F (36.9 C)] 98.4 F (36.9 C) (05/18 1011) Pulse Rate:  [108-149] 109 (05/18 1228) Resp:  [15-16] 16 (05/18 1228) BP: (104-131)/(54-89) 131/89 mmHg (05/18 1228) SpO2:  [97 %-98 %] 97 % (05/18 1228) Weight:  [401 lb 14.4 oz (182.3 kg)] 401 lb 14.4 oz (182.3 kg) (05/18 1243)   General:   Alert,  morbidly obese male found in ICU bed 5. He is accompanied by multiple friends and family members. Head:  Normocephalic and atraumatic. Eyes:  Sclera clear, no icterus.   Conjunctiva pink. Ears:  Normal auditory acuity. Nose:  No deformity, discharge,  or lesions. Mouth:  No deformity or lesions, dentition normal. Neck:  Supple; no masses or thyromegaly. Lungs:  Clear throughout to auscultation.   No wheezes, crackles, or rhonchi. No acute distress. Heart:  Regular rate and rhythm; no murmurs, clicks, rubs,  or gallops. Abdomen:  Massively obese. Positive bowel sounds. Soft and nontender without appreciable mass or organomegaly  Msk:  Symmetrical without gross deformities. Normal posture. Pulses:  Normal pulses noted. Extremities:  Without clubbing or edema. Neurologic:  Alert and  oriented x4;  grossly normal neurologically. Skin:  Intact without significant lesions or rashes. Cervical Nodes:  No significant cervical adenopathy. Psych:  Alert and cooperative. Normal mood and affect.  Recent Labs  09/01/12 1040  WBC 10.7*  HGB 11.7*  HCT 36.0*  PLT 355   BMET  Recent Labs  09/01/12 1040  NA 139  K 4.1  CL 101  CO2 27  GLUCOSE 114*  BUN 27*  CREATININE 0.59  CALCIUM 9.0     Impression:  27 year old morbidly obese gentleman admitted to the hospital with hematemesis. He remains hemodynamically stable. Initial hemoglobin 11.7. Significant NSAID exposure  recently.  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  Recommendations: Agree with IV fluid resuscitation. Nursing staff tells me he has 2 units of Packed red blood cells typed and screened in our blood bank. He has been started on a PPI infusion. Plan for a bedside EGD with therapeutic intention later this afternoon. I discussed the risks, benefits, alternatives and imponderables with the patient and family members. The potential for a failed endoscopy and/or subsequent rebleeding after endoscopy and the potential need for emergency surgery have all been reviewed. Questions answered. All parties agreeable.  Further recommendations to follow pending endoscopic evaluation/intervention.

## 2012-09-02 DIAGNOSIS — K259 Gastric ulcer, unspecified as acute or chronic, without hemorrhage or perforation: Secondary | ICD-10-CM

## 2012-09-02 DIAGNOSIS — K922 Gastrointestinal hemorrhage, unspecified: Secondary | ICD-10-CM

## 2012-09-02 DIAGNOSIS — K254 Chronic or unspecified gastric ulcer with hemorrhage: Secondary | ICD-10-CM | POA: Diagnosis present

## 2012-09-02 DIAGNOSIS — D62 Acute posthemorrhagic anemia: Secondary | ICD-10-CM | POA: Diagnosis present

## 2012-09-02 HISTORY — DX: Acute posthemorrhagic anemia: D62

## 2012-09-02 HISTORY — DX: Chronic or unspecified gastric ulcer with hemorrhage: K25.4

## 2012-09-02 LAB — BASIC METABOLIC PANEL
BUN: 12 mg/dL (ref 6–23)
Calcium: 8.7 mg/dL (ref 8.4–10.5)
Creatinine, Ser: 0.54 mg/dL (ref 0.50–1.35)
GFR calc Af Amer: >90 mL/min (ref >90)
GFR calc non Af Amer: >90 mL/min (ref >90)

## 2012-09-02 LAB — CBC
MCH: 27.8 pg (ref 26.0–34.0)
MCV: 86 fL (ref 78.0–100.0)
Platelets: 300 10*3/uL (ref 150–400)
RDW: 13.1 % (ref 11.5–15.5)

## 2012-09-02 MED ORDER — METHYLPREDNISOLONE SODIUM SUCC 40 MG IJ SOLR
40.0000 mg | Freq: Once | INTRAMUSCULAR | Status: AC
  Administered 2012-09-02: 40 mg via INTRAVENOUS
  Filled 2012-09-02: qty 1

## 2012-09-02 MED ORDER — PREDNISONE 10 MG PO TABS: 10.0000 mg | ORAL_TABLET | Freq: Two times a day (BID) | ORAL | Status: DC

## 2012-09-02 MED ORDER — PREDNISONE 10 MG PO TABS: 15.0000 mg | ORAL_TABLET | Freq: Two times a day (BID) | ORAL | Status: DC

## 2012-09-02 MED ORDER — PREDNISONE 20 MG PO TABS
20.0000 mg | ORAL_TABLET | Freq: Two times a day (BID) | ORAL | Status: DC
  Administered 2012-09-02: 20 mg via ORAL
  Filled 2012-09-02: qty 1

## 2012-09-02 MED ORDER — PREDNISONE 10 MG PO TABS: 10.0000 mg | ORAL_TABLET | Freq: Every day | ORAL | Status: DC

## 2012-09-02 NOTE — Progress Notes (Signed)
REVIEWED.  

## 2012-09-02 NOTE — Progress Notes (Signed)
[facility] PROGRESS NOTE  VITALI SEIBERT NWG:[id number] DOB: 02/13/1986 DOA: 09/01/2012 PCP: No primary provider on file. none.  Assessment/Plan: 1. Upper GI bleed secondary to peptic ulcer disease: Hemoglobin stable. No recurrent bleeding since admission. Plan as below. 2. Peptic ulcer disease: Status post upper endoscopy 5/18 with bleeding control therapy. Continue IV PPI infusion, clear liquid diet. Follow up with GI for repeat EGD in 3 months to verify healing. Discontinue nonsteroidals indefinitely. 3. Acute blood loss anemia: Continue to monitor. Plan as above. 4. Acute gouty arthritis: Continue colchicine. Discontinue steroids. Could consider steroid injection, will discuss with GI. 5. Cigarette smoker: History and quit. Cessation will be recommended 6. Morbid obesity: Dietitian consult   Remain in step down. Continue to monitor CBC. Monitor for recurrent bleeding.  Continue colchicine for gout.  Code Status: Full code DVT prophylaxis: SCDs Family Communication: None present Disposition Plan:  Home when improved  Brendia Sacks, MD  [facility]  Pager [phone] If 7PM-7AM, please contact night-coverage at [email or web address], password [facility] 09/02/2012, 9:02 AM  LOS: 1 day   Clinical Summary: 27 year old man presented to the emergency department with history of hematemesis today. Initial evaluation was notable for tachycardia, hemoglobin 11.7, fecal occult blood positive. Referred for admission. History notable for chronic daily use of naproxen and intense use of ibuprofen for several days prior to admission.  Patient was admitted to step down unit and seen in consultation with gastroenterology. He underwent bedside upper endoscopy which revealed benign appearing gastric ulcer with bleeding stigmata which was treated with bleeding control therapy. PPI infusion recommended to continue for total 72 hours.  Consultants:  Gastroenterology  Procedures:  5/18 bedside  upper endoscopy: Distal esophageal erosions consistent with erosive reflux esophagitis. Prepyloric benign-appearing gastric ulcer with bleeding stigmata status post bleeding control therapy.  HPI/Subjective: No bleeding since admission. No abdominal pain. Complains of left foot and left knee pain, consistent with multiple previous episodes of acute gout flares. Was started on steroids overnight.  Objective: Filed Vitals:   09/02/12 0600 09/02/12 0700 09/02/12 0742 09/02/12 0800  BP: 152/87 143/73  128/69  Pulse: 75 94  85  Temp:   98 F (36.7 C)   TempSrc:   Oral   Resp: 24 18  15   Height:      Weight:      SpO2: 96% 95%  79%    Intake/Output Summary (Last 24 hours) at 09/02/12 0902 Last data filed at 09/02/12 0835  Gross per 24 hour  Intake 2360.83 ml  Output   1601 ml  Net 759.83 ml     Filed Weights   09/01/12 1243 09/01/12 1458 09/02/12 0500  Weight: 182.3 kg (401 lb 14.4 oz) 181.892 kg (401 lb) 189.6 kg (417 lb 15.9 oz)    Exam:  General:  Appears calm and comfortable Eyes: Pupils, irises, lids appear normal ENT: grossly normal hearing Cardiovascular: RRR, no m/r/g. No LE edema. Respiratory: CTA bilaterally, no w/r/r. Normal respiratory effort. Abdomen: soft, ntnd Skin: Some abrasion, excoriation left knee from chronic rash or poor. No erythema of the left leg, knee or foot. Musculoskeletal: Swelling and tenderness dorsum left foot without erythema. Tenderness and warmth left knee. No definite effusion. Psychiatric: grossly normal mood and affect, speech fluent and appropriate  Data Reviewed:  Excellent urine output.  Hemoglobin stable 10.1. Basic metabolic panel unremarkable.  Studies:  None last 24 hours  Scheduled Meds: . colchicine  0.6 mg Oral TID  . predniSONE  20 mg Oral BID WC  Followed by  . [START ON 09/04/2012] predniSONE  15 mg Oral BID WC   Followed by  . [START ON 09/06/2012] predniSONE  10 mg Oral BID WC   Followed by  . [START ON  09/08/2012] predniSONE  10 mg Oral Q breakfast  . sodium chloride  3 mL Intravenous Q12H   Continuous Infusions: . sodium chloride 100 mL/hr at 09/02/12 0844  . pantoprozole (PROTONIX) infusion 8 mg/hr (09/02/12 0800)    Principal Problem:   Gastric ulcer with hemorrhage Active Problems:   Hematemesis   Acute gouty arthritis   Morbid obesity   Upper GI bleed   Acute blood loss anemia   Cigarette smoker   Time spent 25 minutes

## 2012-09-02 NOTE — Progress Notes (Signed)
Subjective: No abdominal pain, N/V, melena, hematochezia.   Objective: Vital signs in last 24 hours: Temp:  [98 F (36.7 C)-98.8 F (37.1 C)] 98 F (36.7 C) (05/19 0742) Pulse Rate:  [25-149] 85 (05/19 0800) Resp:  [13-29] 15 (05/19 0800) BP: (104-152)/(54-93) 128/69 mmHg (05/19 0800) SpO2:  [79 %-100 %] 79 % (05/19 0800) Weight:  [401 lb (181.892 kg)-417 lb 15.9 oz (189.6 kg)] 417 lb 15.9 oz (189.6 kg) (05/19 0500) Last BM Date: 09/01/12 General:   Alert and oriented, pleasant Head:  Normocephalic and atraumatic. Eyes:  No icterus, sclera clear. Conjuctiva pink.  Heart:  S1, S2 present, no murmurs noted.  Lungs: Clear to auscultation bilaterally, without wheezing, rales, or rhonchi.  Abdomen:  Bowel sounds present, soft, morbidly obese, non-tender, non-distended. Difficult to appreciate HSM due to large body habitus.  Msk:  Symmetrical without gross deformities. Normal posture. Pulses:  Normal pulses noted. Extremities:  Trace edema bilaterally,  Neurologic:  Alert and  oriented x4;  grossly normal neurologically. Psych:  Alert and cooperative. Normal mood and affect.  Intake/Output from previous day: 05/18 0701 - 05/19 0700 In: 2235.8 [I.V.:2235.8] Out: 1601 [Urine:1600; Stool:1] Intake/Output this shift: Total I/O In: 125 [I.V.:125] Out: -   Lab Results:  Recent Labs  09/01/12 1907 09/01/12 2259 09/02/12 0307  WBC 9.0 8.9 8.1  HGB 10.6* 10.1* 10.1*  HCT 32.2* 31.1* 31.2*  PLT 320 307 300   BMET  Recent Labs  09/01/12 1040 09/02/12 0307  NA 139 138  K 4.1 3.6  CL 101 100  CO2 27 28  GLUCOSE 114* 112*  BUN 27* 12  CREATININE 0.59 0.54  CALCIUM 9.0 8.7     Recent Labs  09/01/12 1040  LABPROT 13.7  INR 1.06    Assessment: 27 year old male with GI bleed secondary to gastric ulcer in the setting of NSAID use, s/p bleeding control therapy via EGD on 5/18;clinically improving and without evidence of overt GI bleeding.    Plan: PPI infusion X 72  hours Follow-up H.pylori serologies Surveillance EGD in 3 months Avoid NSAIDs indefinitely Clear liquids  Nira Retort, ANP-BC [facility]    LOS: 1 day    09/02/2012, 9:47 AM

## 2012-09-02 NOTE — Progress Notes (Signed)
UR Chart Review Completed  

## 2012-09-02 NOTE — Progress Notes (Signed)
INITIAL NUTRITION ASSESSMENT  DOCUMENTATION CODES Per approved criteria  -Extreme Obesity Class III   INTERVENTION:  Provided diet education to promote intake healthy food choices and promote gradual wt loss of 1-2#  per month.   Recommend initial short-term goal: to decrease current wt below 400# within the next 90 days.    Suggest pt follow-up with outpatient RD for ongoing monitoring of  Diet modifications and wt loss progress.  NUTRITION DIAGNOSIS: Extreme obesity related to hx of energy intake exceeds requirements as evidenced by BMI=65.45, Extreme Obesity Class III.   Goal: Pt will modify eating habits sufficient to improve his nutrition status and achieve a healthy body weight  Monitor:  Diet advancement/tolerance, compliance, wt changes and labs  Reason for Assessment: Consult to assess nutrition status and requirements  27 y.o. male  Admitting Dx: Gastric ulcer with hemorrhage  ASSESSMENT: Diet hx reviewed. Usually eats 2 meals per day but lately pt reports that he has eaten one time over past week. Appetite very poor PTA.  Currently tolerating clear liquids.  He has experienced wt loss of 29#, 6% since January 2014 (over past 4 months). He lives with a friend. Currently he's being followed by GI for upper GI bleed and is s/p upper endoscopy 09/01/12.    RD provided "Weight Loss Tips" handout from the Academy of [employer]. Emphasized the importance of serving sizes and provided examples of correct portions of common foods. Discussed importance of controlled and consistent intake throughout the day. Provided examples of ways to balance meals/snacks and encouraged intake of high-fiber, whole grain complex carbohydrates. Emphasized the importance of hydration with calorie-free beverages and limiting sugar-sweetened beverages. Encouraged pt to discuss physical activity options with physician. Teach back method used.  Expect good compliance. He would benefit from  additional follow-up by outpatient RD.  Height: Ht Readings from Last 1 Encounters:  09/01/12 5\' 7"  (1.702 m)    Weight: Wt Readings from Last 1 Encounters:  09/02/12 417 lb 15.9 oz (189.6 kg)    Ideal Body Weight: 148# (67 kg)  % Ideal Body Weight: 280%  Wt Readings from Last 10 Encounters:  09/02/12 417 lb 15.9 oz (189.6 kg)  09/02/12 417 lb 15.9 oz (189.6 kg)  06/25/12 430 lb (195.047 kg)  06/16/12 439 lb (199.129 kg)  05/03/12 447 lb (202.758 kg)  03/25/12 447 lb 6 oz (202.928 kg)    Usual Body Weight: 447#  % Usual Body Weight: 94%  BMI:  Body mass index is 65.45 kg/(m^2). Extreme Obesity Class III  Estimated Nutritional Needs: Kcal: 1608-1900 (to promote weight loss) Protein: 147-167 gr  Fluid: >2500 ml/day  Skin: No issues noted  Diet Order: Clear Liquid  EDUCATION NEEDS: -Education needs addressed   Intake/Output Summary (Last 24 hours) at 09/02/12 1005 Last data filed at 09/02/12 0835  Gross per 24 hour  Intake 2360.83 ml  Output   1601 ml  Net 759.83 ml    Last BM: 09/01/12  Labs:   Recent Labs Lab 09/01/12 1040 09/02/12 0307  NA 139 138  K 4.1 3.6  CL 101 100  CO2 27 28  BUN 27* 12  CREATININE 0.59 0.54  CALCIUM 9.0 8.7  GLUCOSE 114* 112*    CBG (last 3)  No results found for this basename: GLUCAP,  in the last 72 hours  Scheduled Meds: . colchicine  0.6 mg Oral TID  . sodium chloride  3 mL Intravenous Q12H    Continuous Infusions: . pantoprozole (PROTONIX) infusion  8 mg/hr (09/02/12 0800)    Past Medical History  Diagnosis Date  . Gout   . Cellulitis   . Obesity     Past Surgical History  Procedure Laterality Date  . Tonsillectomy      Royann Shivers MS,RD,LDN,CSG Office: [phone] Pager: [phone]

## 2012-09-03 DIAGNOSIS — F17200 Nicotine dependence, unspecified, uncomplicated: Secondary | ICD-10-CM

## 2012-09-03 LAB — CBC
MCHC: 32.4 g/dL (ref 30.0–36.0)
Platelets: 288 10*3/uL (ref 150–400)
RDW: 13.2 % (ref 11.5–15.5)

## 2012-09-03 MED ORDER — COLCHICINE 0.6 MG PO TABS
0.6000 mg | ORAL_TABLET | ORAL | Status: AC
  Administered 2012-09-03 (×2): 0.6 mg via ORAL

## 2012-09-03 MED ORDER — COLCHICINE 0.6 MG PO TABS
0.6000 mg | ORAL_TABLET | Freq: Three times a day (TID) | ORAL | Status: DC
  Administered 2012-09-04 (×2): 0.6 mg via ORAL
  Filled 2012-09-03 (×2): qty 1

## 2012-09-03 MED ORDER — HYDROCODONE-ACETAMINOPHEN 5-325 MG PO TABS
1.0000 | ORAL_TABLET | Freq: Four times a day (QID) | ORAL | Status: DC | PRN
  Administered 2012-09-04 (×2): 1 via ORAL
  Administered 2012-09-04 – 2012-09-06 (×6): 2 via ORAL
  Administered 2012-09-06: 1 via ORAL
  Administered 2012-09-07 (×3): 2 via ORAL
  Filled 2012-09-03: qty 1
  Filled 2012-09-03 (×6): qty 2
  Filled 2012-09-03: qty 1
  Filled 2012-09-03 (×4): qty 2

## 2012-09-03 NOTE — Progress Notes (Signed)
Pt transferred to room 324 in stable condition accompanied by NT/RN.  Pt's IV site patent and WNL. Pt on RA. Report given to [facility], Charity fundraiser.

## 2012-09-03 NOTE — Progress Notes (Signed)
[facility] PROGRESS NOTE  [name] ZOX:[id number] DOB: 08-19-1985 DOA: 09/01/2012 PCP: No primary provider on file. none.  Clinical Summary: 27 year old man presented to the emergency department with history of hematemesis. Initial evaluation was notable for tachycardia, hemoglobin 11.7, fecal occult blood positive. Referred for admission. History notable for chronic daily use of naproxen and intense use of ibuprofen for several days prior to admission.  Patient was admitted to step down unit and seen in consultation with gastroenterology. He underwent bedside upper endoscopy which revealed benign appearing gastric ulcer with bleeding stigmata which was treated with bleeding control therapy. PPI infusion recommended to continue for total 72 hours. He has had no further bleeding since admission. His medical condition has been complicated by typical acute gout flare. Discharge home is anticipated 5/21 when PPI infusion complete. Allopurinol is suggested in the outpatient setting when over acute gout flare. Unfortunately treatment options are limited for this acute flare.  Assessment/Plan: 1. Upper GI bleed secondary to peptic ulcer disease: Hemoglobin stable. No recurrent bleeding since admission. Plan as below. 2. Peptic ulcer disease: NSAID induced. Status post upper endoscopy 5/18 with bleeding control therapy. Continue IV PPI infusion total 72 hours, complete 5/21. Diet per GI. Follow up with GI for repeat EGD in 3 months to verify healing. Discontinue nonsteroidals indefinitely. 3. Acute blood loss anemia: Secondary peptic ulcer disease. Continue to monitor. Plan as above. 4. Acute gouty arthritis: Pain control. Could consider steroid injection, will discuss with GI. Would avoid IV steroids and oral steroids. 5. Cigarette smoker: Recommend cessation. 6. Extreme obesity class III BMI 65.45: Appreciate dietitian recommendations for gradual weight loss with short-term goal to decrease  weight below 400 pounds within the next 90 days. Consider outpatient dietitian referral.   Transfer to medical floor. Continue PPI infusion.  Continue colchicine for gout and pain control.  Likely home 5/21  Code Status: Full code DVT prophylaxis: SCDs Family Communication: None present Disposition Plan:  Home when improved  Brendia Sacks, MD  [facility]  Pager [phone] If 7PM-7AM, please contact night-coverage at [email or web address], password [facility] 09/03/2012, 8:57 AM  LOS: 2 days   Consultants:  Gastroenterology  Procedures:  5/18 bedside upper endoscopy: Distal esophageal erosions consistent with erosive reflux esophagitis. Prepyloric benign-appearing gastric ulcer with bleeding stigmata status post bleeding control therapy.  HPI/Subjective: No bleeding. No abdominal pain. Continues to have left knee and left foot pain from gout. No new issues per nursing.  Objective: Filed Vitals:   09/03/12 0117 09/03/12 0444 09/03/12 0503 09/03/12 0800  BP: 140/75  147/82   Pulse:      Temp:  98 F (36.7 C)  98 F (36.7 C)  TempSrc:  Oral  Oral  Resp:   18   Height:      Weight:      SpO2: 95%  99%     Intake/Output Summary (Last 24 hours) at 09/03/12 0857 Last data filed at 09/03/12 0601  Gross per 24 hour  Intake 358.42 ml  Output   1250 ml  Net -891.58 ml     Filed Weights   09/01/12 1243 09/01/12 1458 09/02/12 0500  Weight: 182.3 kg (401 lb 14.4 oz) 181.892 kg (401 lb) 189.6 kg (417 lb 15.9 oz)    Exam:  General:  Appears calm and comfortable Cardiovascular: RRR, no m/r/g. No LE edema. Respiratory: CTA bilaterally, no w/r/r. Normal respiratory effort. Abdomen: soft, ntnd Skin: Some abrasion, excoriation left knee from chronic rash or poor. No erythema of the left  leg, knee or foot. Musculoskeletal: Swelling and tenderness dorsum left foot without erythema. Tenderness and warmth left knee. No definite effusion. Psychiatric: grossly normal mood and affect,  speech fluent and appropriate  Exam unchanged 5/20  Data Reviewed:  Excellent urine output.  Hemoglobin 9.7  Studies:  None last 24 hours  Scheduled Meds: . colchicine  0.6 mg Oral TID  . sodium chloride  3 mL Intravenous Q12H   Continuous Infusions: . pantoprozole (PROTONIX) infusion 8 mg/hr (09/03/12 0618)    Principal Problem:   Gastric ulcer with hemorrhage Active Problems:   Hematemesis   Acute gouty arthritis   Morbid obesity   Upper GI bleed   Acute blood loss anemia   Cigarette smoker   Time spent 20 minutes

## 2012-09-03 NOTE — Progress Notes (Signed)
According to report, patient has been refusing SCD"s since admission.  Explained the importance for them.  Patient agreed to give them a try.  SCD's placed on patient.

## 2012-09-03 NOTE — Progress Notes (Signed)
Subjective: No N/V, abdominal pain. Complains of gout in left foot. No signs of overt GI bleeding.   Objective: Vital signs in last 24 hours: Temp:  [98 F (36.7 C)-98.8 F (37.1 C)] 98 F (36.7 C) (05/20 0444) Pulse Rate:  [55-108] 81 (05/19 1513) Resp:  [12-23] 18 (05/20 0503) BP: (89-147)/(51-88) 147/82 mmHg (05/20 0503) SpO2:  [79 %-100 %] 99 % (05/20 0503) Last BM Date: 09/02/12 General:   Alert and oriented, pleasant Head:  Normocephalic and atraumatic. Eyes:  No icterus, sclera clear. Conjuctiva pink.  Mouth:  Without lesions, mucosa pink and moist. .  Heart:  S1, S2 present, no murmurs noted.  Lungs: Clear to auscultation bilaterally, without wheezing, rales, or rhonchi.  Abdomen:  Obese, +BS, non-tender, non-distended.  Msk:  Symmetrical without gross deformities. Normal posture. Extremities:  +lower extremity pedal edema Neurologic:  Alert and  oriented x4;  grossly normal neurologically. Skin:  Warm and dry, intact without significant lesions.  Psych:  Flat affect  Intake/Output from previous day: 05/19 0701 - 05/20 0700 In: 483.4 [I.V.:483.4] Out: 1250 [Urine:1250] Intake/Output this shift:    Lab Results:  Recent Labs  09/01/12 2259 09/02/12 0307 09/03/12 0456  WBC 8.9 8.1 9.5  HGB 10.1* 10.1* 9.7*  HCT 31.1* 31.2* 29.9*  PLT 307 300 288   BMET  Recent Labs  09/01/12 1040 09/02/12 0307  NA 139 138  K 4.1 3.6  CL 101 100  CO2 27 28  GLUCOSE 114* 112*  BUN 27* 12  CREATININE 0.59 0.54  CALCIUM 9.0 8.7  PT/INR  Recent Labs  09/01/12 1040  LABPROT 13.7  INR 1.06      Assessment: 27 year old male with GI bleed secondary to gastric ulcer in the setting of NSAID use, s/p bleeding control therapy via EGD on 5/18;clinically improving and without evidence of overt GI bleeding. Only complaint is persistent gout pain. Dr. Irene Limbo raised the possibility of steroids IV for treatment; unfortunately, options quite limited in this setting and will  need to discuss with Dr. Darrick Penna. As of note, negative H.pylori serologies.   Plan: PPI infusion total of 72 hours Surveillance EGD in 3 months Avoid NSAIDs indefinitely Advance diet to soft mechanical Appropriate for transfer out of ICU  Nira Retort, ANP-BC [facility]    LOS: 2 days    09/03/2012, 7:58 AM    Addendum at 12:20 on 5/19: Discussed with Dr. Darrick Penna. Avoid IV steroids as well, due to delay of healing. Use colchicine. Will relay to Dr. Irene Limbo.

## 2012-09-03 NOTE — Care Management Note (Unsigned)
    Page 1 of 2   09/06/2012     4:33:45 PM   CARE MANAGEMENT NOTE 09/06/2012  Patient:  CHARLI, LIBERATORE   Account Number:  [id number]  Date Initiated:  09/03/2012  Documentation initiated by:  Sharrie Rothman  Subjective/Objective Assessment:   Pt admitted from home with gi bleed. Pt lives with friends who assist him with ADL's. Pt has no job and no money for medications and MD visits. Pt has crutches that he uses at home for standing and ambulating.     Action/Plan:   CM advised [facility]. CM offered pt [facility] but pt is unable to afford even a sliding scale fee. Will give voucher for medication at discharge. Pt understands how voucher works. FC aware.   Anticipated DC Date:  09/05/2012   Anticipated DC Plan:  HOME/SELF CARE  In-house referral  Financial Counselor      DC Planning Services  CM consult  MATCH Program      Choice offered to / List presented to:             Status of service:  In process, will continue to follow Medicare Important Message given?   (If response is "NO", the following Medicare IM given date fields will be blank) Date Medicare IM given:   Date Additional Medicare IM given:    Discharge Disposition:    Per UR Regulation:    If discussed at Long Length of Stay Meetings, dates discussed:    Comments:  09/03/12 1205 Arlyss Queen, RN BSN CM  09/06/12 Tashi Band Leanord Hawking RN BSN CM CM Amie Critchley and CSW [place] worked on [employer]. Per Md, pt medically stable for DC. MATCH voucher left for pt and list of shelters left with pt. Pt from home living with friends. Reportedly unable to return to their house until tomorrow and no place to go today. Per Nursing administration, discharging pt to a shelter or hotel is an unsafe plan. Pt is self pay and the Bariatric  [facility] and walker would cost over $250 (per [facility] DME [place]) which is cost prohibitive. Additional barrier would be where to deliver the equipment. MD  aware.

## 2012-09-03 NOTE — Progress Notes (Signed)
REVIEWED.  COMPLETE 72 HRS PROTONIX. RECOMMEND COLCHICINE FOR GOUTY FLARE AT THIS TIME. ADVANCE DIET.

## 2012-09-04 ENCOUNTER — Encounter (HOSPITAL_COMMUNITY): Payer: Self-pay | Admitting: Internal Medicine

## 2012-09-04 ENCOUNTER — Inpatient Hospital Stay (HOSPITAL_COMMUNITY): Payer: Medicaid Other

## 2012-09-04 LAB — CBC
HCT: 28.3 % — ABNORMAL LOW (ref 39.0–52.0)
MCV: 87.1 fL (ref 78.0–100.0)
Platelets: 283 10*3/uL (ref 150–400)
RBC: 3.25 MIL/uL — ABNORMAL LOW (ref 4.22–5.81)
WBC: 7.1 10*3/uL (ref 4.0–10.5)

## 2012-09-04 MED ORDER — COLCHICINE 0.6 MG PO TABS
0.6000 mg | ORAL_TABLET | Freq: Two times a day (BID) | ORAL | Status: DC
  Administered 2012-09-04 – 2012-09-07 (×6): 0.6 mg via ORAL
  Filled 2012-09-04 (×6): qty 1

## 2012-09-04 MED ORDER — DICLOFENAC SODIUM 1 % TD GEL
2.0000 g | Freq: Three times a day (TID) | TRANSDERMAL | Status: DC
  Administered 2012-09-04 – 2012-09-07 (×11): 2 g via TOPICAL
  Filled 2012-09-04: qty 100

## 2012-09-04 MED ORDER — METHYLPREDNISOLONE SODIUM SUCC 125 MG IJ SOLR
80.0000 mg | Freq: Once | INTRAMUSCULAR | Status: AC
  Administered 2012-09-04: 80 mg via INTRAVENOUS
  Filled 2012-09-04: qty 2

## 2012-09-04 MED ORDER — PANTOPRAZOLE SODIUM 40 MG PO TBEC
40.0000 mg | DELAYED_RELEASE_TABLET | Freq: Two times a day (BID) | ORAL | Status: DC
  Administered 2012-09-04 – 2012-09-07 (×6): 40 mg via ORAL
  Filled 2012-09-04 (×7): qty 1

## 2012-09-04 NOTE — Progress Notes (Signed)
Subjective: The patient denies nausea or vomiting. He had one slightly black stool but no bright red blood per rectum. He has some loose stools and abdominal cramping which he says is likely from colchicine. He complains of severe left knee and foot pain which he says is attributable to a gout flareup.  Objective: Vital signs in last 24 hours: Filed Vitals:   09/03/12 0800 09/03/12 1508 09/03/12 2051 09/04/12 0633  BP:  137/81 154/80 116/70  Pulse:  88 84   Temp: 98 F (36.7 C) 97.8 F (36.6 C) 97.5 F (36.4 C) 97.7 F (36.5 C)  TempSrc: Oral Oral Oral Oral  Resp:  18 20 20   Height:      Weight:      SpO2:  97% 100% 95%    Intake/Output Summary (Last 24 hours) at 09/04/12 1417 Last data filed at 09/04/12 1237  Gross per 24 hour  Intake 1003.83 ml  Output    900 ml  Net 103.83 ml    Weight change:   Physical exam: General: Morbidly Obese 27 year old Caucasian man sitting up in bed, in no acute distress. Lungs: Clear to auscultation bilaterally.  Heart: S1, S2, with no murmurs rubs or gallops. Abdomen: Morbidly obese, positive bowel sounds, soft, nontender, nondistended. Extremities: Moderate tenderness over the left knee globally. Moderate tenderness over the left foot go over lead. No acute hot red joint noted. Psychiatric/neurologic: He is tearful because of pain and his inability to ambulate. Otherwise, he is alert and oriented x3. Cranial nerves II through XII are intact.  Lab Results: Basic Metabolic Panel:  Recent Labs  96/04/54 0307  NA 138  K 3.6  CL 100  CO2 28  GLUCOSE 112*  BUN 12  CREATININE 0.54  CALCIUM 8.7   Liver Function Tests: No results found for this basename: AST, ALT, ALKPHOS, BILITOT, PROT, ALBUMIN,  in the last 72 hours No results found for this basename: LIPASE, AMYLASE,  in the last 72 hours No results found for this basename: AMMONIA,  in the last 72 hours CBC:  Recent Labs  09/03/12 0456 09/04/12 0621  WBC 9.5 7.1  HGB 9.7*  9.2*  HCT 29.9* 28.3*  MCV 86.2 87.1  PLT 288 283   Cardiac Enzymes: No results found for this basename: CKTOTAL, CKMB, CKMBINDEX, TROPONINI,  in the last 72 hours BNP: No results found for this basename: PROBNP,  in the last 72 hours D-Dimer: No results found for this basename: DDIMER,  in the last 72 hours CBG: No results found for this basename: GLUCAP,  in the last 72 hours Hemoglobin A1C: No results found for this basename: HGBA1C,  in the last 72 hours Fasting Lipid Panel: No results found for this basename: CHOL, HDL, LDLCALC, TRIG, CHOLHDL, LDLDIRECT,  in the last 72 hours Thyroid Function Tests: No results found for this basename: TSH, T4TOTAL, FREET4, T3FREE, THYROIDAB,  in the last 72 hours Anemia Panel: No results found for this basename: VITAMINB12, FOLATE, FERRITIN, TIBC, IRON, RETICCTPCT,  in the last 72 hours Coagulation: No results found for this basename: LABPROT, INR,  in the last 72 hours Urine Drug Screen: Drugs of Abuse  No results found for this basename: labopia, cocainscrnur, labbenz, amphetmu, thcu, labbarb    Alcohol Level: No results found for this basename: ETH,  in the last 72 hours Urinalysis: No results found for this basename: COLORURINE, APPERANCEUR, LABSPEC, PHURINE, GLUCOSEU, HGBUR, BILIRUBINUR, KETONESUR, PROTEINUR, UROBILINOGEN, NITRITE, LEUKOCYTESUR,  in the last 72 hours Misc. Labs:  Micro: Recent Results (from the past 240 hour(s))  MRSA PCR SCREENING     Status: None   Collection Time    09/01/12  1:41 PM      Result Value Range Status   MRSA by PCR NEGATIVE  NEGATIVE Final   Comment:            The GeneXpert MRSA Assay (FDA     approved for NASAL specimens     only), is one component of a     comprehensive MRSA colonization     surveillance program. It is not     intended to diagnose MRSA     infection nor to guide or     monitor treatment for     MRSA infections.    Studies/Results: Dg Knee Left Port  09/04/2012    *RADIOLOGY REPORT*  Clinical Data: Acute pain.  Question gout.  PORTABLE LEFT KNEE - 1-2 VIEW  Comparison: None  Findings: AP and attempted lateral views.  AP is centered low.  The lateral view is moderately oblique, and also centered low.  This is essentially nondiagnostic.  No gross abnormality identified on the AP view.  Joint effusion cannot be evaluated/excluded.  IMPRESSION: Severely degraded exam.  No definite acute finding.   Original Report Authenticated By: Jeronimo Greaves, M.D.    Medications:  Scheduled: . colchicine  0.6 mg Oral TID WC  . diclofenac sodium  2 g Topical TID  . pantoprazole  40 mg Oral BID AC  . sodium chloride  3 mL Intravenous Q12H   Continuous:  NWG:[id number], acetaminophen, HYDROcodone-acetaminophen, morphine injection, ondansetron (ZOFRAN) IV, ondansetron  Assessment: Principal Problem:   Gastric ulcer with hemorrhage Active Problems:   Hematemesis   Acute gouty arthritis   Morbid obesity   Upper GI bleed   Acute blood loss anemia   Cigarette smoker   1. Upper GI bleed secondary to gastric ulcer. Likely NSAID induced. Status post EGD on 5/18 with bleeding control therapy. H. pylori serologies negative. Protonix infusion time 72 hours completed.  Acute blood loss anemia. Hemoglobin stable the past 24 hours.  Acute gouty arthritis. He is on colchicine, but it does not appear to be helping and is causing GI upset. I have given him 80 mg of Solu-Medrol x1, otherwise, the patient may not be able to get any relief. X-ray of his knee reveals a severely degraded exam, but no acute findings.  Cigarette smoker/tobacco abuse. He was advised to stop smoking.  Morbid obesity. 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 We'll order PT consult. The patient was informed that he may require short-term skilled nursing facility placement for rehabilitation.   Plan:  1. As above. 2. PT  consult pending. 3. Gave one dose of IV Solu-Medrol. We'll decrease the dose of colchicine to twice a day secondary to GI upset. We'll add topical Voltaren gel 3 times a day.   LOS: 3 days   Coyle Stordahl 09/04/2012, 2:17 PM

## 2012-09-04 NOTE — Plan of Care (Signed)
Problem: Phase III Progression Outcomes Goal: Activity at appropriate level-compared to baseline (UP IN CHAIR FOR HEMODIALYSIS)  Outcome: Adequate for Discharge At baseline Difficult and painful because of gout Goal: H&H stablized <1gm drop in 24 hrs, no active bleeding Outcome: Completed/Met Date Met:  09/04/12 Hgb 9.2 from 9.7

## 2012-09-04 NOTE — Progress Notes (Addendum)
Subjective: Complains of gout in left leg. Tearful. Worried about going home. +abdominal cramping with Colchicine. No N/V. Tolerating diet. 5 loose stools yesterday, 3 this morning. No signs of overt GI bleeding.   Objective: Vital signs in last 24 hours: Temp:  [97.5 F (36.4 C)-98 F (36.7 C)] 97.7 F (36.5 C) (05/21 1610) Pulse Rate:  [84-88] 84 (05/20 2051) Resp:  [18-20] 20 (05/21 9604) BP: (116-154)/(70-81) 116/70 mmHg (05/21 0633) SpO2:  [95 %-100 %] 95 % (05/21 5409) Last BM Date: 09/03/12 General:   Alert and oriented, appears anxious Head:  Normocephalic and atraumatic. Heart:  S1, S2 present, no murmurs noted.  Lungs: Clear to auscultation bilaterally, without wheezing, rales, or rhonchi.  Abdomen:  Bowel sounds present, soft, non-tender, non-distended. Massively obese, difficult to appreciate HSM Extremities:  Left foot tender to palpation, + 1+ edema bilateral lower extremities Neurologic:  Alert and  oriented x4;  grossly normal neurologically.  Intake/Output from previous day: 05/20 0701 - 05/21 0700 In: 763.8 [P.O.:480; I.V.:283.8] Out: 300 [Urine:300] Intake/Output this shift:    Lab Results:  Recent Labs  09/02/12 0307 09/03/12 0456 09/04/12 0621  WBC 8.1 9.5 7.1  HGB 10.1* 9.7* 9.2*  HCT 31.2* 29.9* 28.3*  PLT 300 288 283   BMET  Recent Labs  09/01/12 1040 09/02/12 0307  NA 139 138  K 4.1 3.6  CL 101 100  CO2 27 28  GLUCOSE 114* 112*  BUN 27* 12  CREATININE 0.59 0.54  CALCIUM 9.0 8.7    PT/INR  Recent Labs  09/01/12 1040  LABPROT 13.7  INR 1.06     Assessment: 27 year old male with GI bleed secondary to gastric ulcer in the setting of NSAID use, s/p bleeding control therapy via EGD on 5/18. Negative H.pylori serologies. Protonix drip will have completed 72 hours by noon today. Anticipate d/c of drip with conversion to PPI BID as an outpatient. Will need outpatient EGD in 3 months for surveillance of ulcer healing. Patient  continues to report gout-like pain of his left foot/left knee, which is his typical presentation with gout. Unfortunately, treatment options quite limited at this time due to known ulcer disease. IV steroids would impair healing. Likely loose stools secondary to Colchicine.    Plan: D/C Protonix drip around noon Start PPI BID, first dose this evening Discuss with Dr. Jena Gauss any other possible therapies that would not be contraindicated for gout in this particular setting Surveillance EGD in 3 months: our office will see him as an outpatient to schedule this  Nira Retort, ANP-BC [facility]     LOS: 3 days    09/04/2012, 7:58 AM   Attending note  No further bleeding. Patient needs additional treatment for gout. Moderate doses of short-term corticosteroid therapy probably be okay without any inordinate risk. What absolutely avoid nonsteroidal agents

## 2012-09-04 NOTE — Plan of Care (Signed)
Problem: Phase I Progression Outcomes Goal: OOB as tolerated unless otherwise ordered Outcome: Not Progressing Gout s/s treated with medicationColchicine  Problem: Phase II Progression Outcomes Goal: Progress activity as tolerated unless otherwise ordered Outcome: Progressing oob to [facility] pt is in pain because of gout symptoms Goal: Other Phase II Outcomes/Goals Outcome: Completed/Met Date Met:  09/04/12 Steroid added to manage symptoms of gout

## 2012-09-05 ENCOUNTER — Encounter: Payer: Self-pay | Admitting: Internal Medicine

## 2012-09-05 ENCOUNTER — Telehealth: Payer: Self-pay | Admitting: Gastroenterology

## 2012-09-05 DIAGNOSIS — K259 Gastric ulcer, unspecified as acute or chronic, without hemorrhage or perforation: Secondary | ICD-10-CM

## 2012-09-05 DIAGNOSIS — D649 Anemia, unspecified: Secondary | ICD-10-CM

## 2012-09-05 DIAGNOSIS — E876 Hypokalemia: Secondary | ICD-10-CM | POA: Diagnosis present

## 2012-09-05 LAB — BASIC METABOLIC PANEL
BUN: 6 mg/dL (ref 6–23)
CO2: 33 mEq/L — ABNORMAL HIGH (ref 19–32)
Chloride: 102 mEq/L (ref 96–112)
GFR calc non Af Amer: >90 mL/min (ref >90)
Glucose, Bld: 121 mg/dL — ABNORMAL HIGH (ref 70–99)
Potassium: 3.2 mEq/L — ABNORMAL LOW (ref 3.5–5.1)
Sodium: 142 mEq/L (ref 135–145)

## 2012-09-05 LAB — TYPE AND SCREEN
ABO/RH(D): A POS
Antibody Screen: NEGATIVE
Unit division: 0

## 2012-09-05 LAB — CBC
HCT: 28.5 % — ABNORMAL LOW (ref 39.0–52.0)
Hemoglobin: 9.4 g/dL — ABNORMAL LOW (ref 13.0–17.0)
MCHC: 33 g/dL (ref 30.0–36.0)
RBC: 3.32 MIL/uL — ABNORMAL LOW (ref 4.22–5.81)

## 2012-09-05 MED ORDER — POTASSIUM CHLORIDE CRYS ER 20 MEQ PO TBCR
30.0000 meq | EXTENDED_RELEASE_TABLET | Freq: Two times a day (BID) | ORAL | Status: AC
  Administered 2012-09-05 (×2): 30 meq via ORAL
  Filled 2012-09-05 (×2): qty 1

## 2012-09-05 NOTE — Clinical Social Work Psychosocial (Signed)
Clinical Social Work Department BRIEF PSYCHOSOCIAL ASSESSMENT 09/05/2012  Patient:  [name], [name]     Account Number:  [id number]     Admit date:  09/01/2012  Clinical Social Worker:  Nancie Neas  Date/Time:  09/05/2012 10:30 AM  Referred by:  Physician  Date Referred:  09/05/2012 Referred for  SNF Placement   Other Referral:   Interview type:  Patient Other interview type:    PSYCHOSOCIAL DATA Living Status:  FRIEND(S) Admitted from facility:   Level of care:   Primary support name:  Leonette Most Primary support relationship to patient:  FRIEND Degree of support available:   supportive per pt    CURRENT CONCERNS Current Concerns  Post-Acute Placement   Other Concerns:    SOCIAL WORK ASSESSMENT / PLAN CSW met with pt at bedside following PT eval recommending SNF at d/c. Pt alert and oriented and reports he lives with his friends. Pt is currently unemployed and has been staying with Leonette Most and his wife and children. He describes Leonette Most as his best support. Prior to admission, pt said he was using crutches and sometimes required assistance in getting up. Pt is currently in a lot of pain and was limited due to this with PT. Pt acknowledges that he cannot return to his friend's house requiring this much assistance as his friend's wife is also pregnant. CSW discussed placement process. Pt is self-pay and unable to afford SNF. CSW will talk with supervisor about LOG. Pt understands this may be further away and said he will just have to go where he can. Report in progression questioning depression. Pt is tearful during assessment but denies any feelings of depression. He states he is just in pain.   Assessment/plan status:  Psychosocial Support/Ongoing Assessment of Needs Other assessment/ plan:   Information/referral to community resources:   SNF list    PATIENT'S/FAMILY'S RESPONSE TO PLAN OF CARE: Pt agreeable to ST SNF at d/c. CSW will initiate SNF bed search and follow up  with bed offers when available.       Derenda Fennel, [place] [phone]

## 2012-09-05 NOTE — Clinical Social Work Placement (Signed)
Clinical Social Work Department CLINICAL SOCIAL WORK PLACEMENT NOTE 09/05/2012  Patient:  [name], [name]  Account Number:  [id number] Admit date:  09/01/2012  Clinical Social Worker:  Derenda Fennel, LCSW  Date/time:  09/05/2012 10:30 AM  Clinical Social Work is seeking post-discharge placement for this patient at the following level of care:   SKILLED NURSING   (*CSW will update this form in Epic as items are completed)   09/05/2012  Patient/family provided with [facility] Department of Clinical Social Work's list of facilities offering this level of care within the geographic area requested by the patient (or if unable, by the patient's family).  09/05/2012  Patient/family informed of their freedom to choose among providers that offer the needed level of care, that participate in Medicare, Medicaid or managed care program needed by the patient, have an available bed and are willing to accept the patient.  09/05/2012  Patient/family informed of [facility]' ownership interest in [facility], as well as of the fact that they are under no obligation to receive care at this facility.  PASARR submitted to EDS on 09/05/2012 PASARR number received from EDS on 09/05/2012  FL2 transmitted to all facilities in geographic area requested by pt/family on  09/05/2012 FL2 transmitted to all facilities within larger geographic area on 09/05/2012  Patient informed that his/her managed care company has contracts with or will negotiate with  certain facilities, including the following:     Patient/family informed of bed offers received:   Patient chooses bed at  Physician recommends and patient chooses bed at    Patient to be transferred to  on   Patient to be transferred to facility by   The following physician request were entered in Epic:   Additional Comments: LOG  Derenda Fennel, LCSW [phone]

## 2012-09-05 NOTE — Evaluation (Signed)
Physical Therapy Evaluation Patient Details Name: [name] MRN: [id number] DOB: 03/24/1986 Today's Date: 09/05/2012 Time: [id number] PT Time Calculation (min): 21 min  PT Assessment / Plan / Recommendation Clinical Impression  Pt was seen for evaluation.  He appeared to be mildly fearful as well as tearful throughout my visit.  He reports having constant pain in both LEs for the past 10 years and now the pain is extreme;  His pain was so intense in the LLE that he would not allow me to do a compleate evaluation.  He maintains the L knee in 70 degrees of flexion and I could only extend it about 30 degrees.  He would not tolerate any gentle touch to his foot and screams in pain.  I could not detect any warmth or erythema in the knee or ankle which we usually see with gout.  He describes severe pain in the RLE as well, just not as bad as the LLE.  I tried to get him to sit over the edge of the bed but he would not agree to try due to pain.  The pt appears to be very emotional and easily upset, lacks maturity.  He clearly is not going to be able to manage at home in his current state, so I am recommendint SNF at d/c.    PT Assessment  Patient needs continued PT services    Follow Up Recommendations  SNF    Does the patient have the potential to tolerate intense rehabilitation      Barriers to Discharge Inaccessible home environment;Decreased caregiver support morbid obesity,    Equipment Recommendations  None recommended by PT (to be determined)    Recommendations for Other Services     Frequency Min 3X/week    Precautions / Restrictions Precautions Precautions: Fall Restrictions Weight Bearing Restrictions: No   Pertinent Vitals/Pain       Mobility  Bed Mobility Bed Mobility: Not assessed Details for Bed Mobility Assistance: pt states that he is unable to move due to extreme pain    Exercises     PT Diagnosis: Difficulty walking;Acute pain  PT Problem List: Decreased  activity tolerance;Decreased mobility;Pain;Decreased range of motion PT Treatment Interventions: Gait training;Functional mobility training;Therapeutic activities;Therapeutic exercise;Patient/family education   PT Goals Acute Rehab PT Goals PT Goal Formulation: With patient Time For Goal Achievement: 09/12/12 Potential to Achieve Goals: Fair Pt will go Supine/Side to Sit: with modified independence;with HOB not 0 degrees (comment degree) PT Goal: Supine/Side to Sit - Progress: Goal set today Pt will go Sit to Stand: with supervision;with upper extremity assist PT Goal: Sit to Stand - Progress: Goal set today Pt will Ambulate: 1 - 15 feet;with min assist;with least restrictive assistive device PT Goal: Ambulate - Progress: Goal set today  Visit Information  Last PT Received On: 09/05/12    Subjective Data  Subjective: I have had terrible pain in both legs for the past 10 years...pain is there all of the time...has never been evaluated by a MD...now the pain is even worse in both legs, the LLE being the most painful (9 on a 0 to 10 scale)...pain is especially bad in the knee and foot Patient Stated Goal: none stated   Prior Functioning  Home Living Lives With: Friend(s) Available Help at Discharge: Available PRN/intermittently Type of Home: Mobile home Home Access: Stairs to enter Entrance Stairs-Number of Steps: 8 Entrance Stairs-Rails: Right;Left Home Layout: One level Bathroom Shower/Tub: Tub/shower unit Home Adaptive Equipment: Crutches Prior Function Level of Independence:  Independent with assistive device(s) Able to Take Stairs?: Yes Driving: Yes Vocation: Unemployed Comments: pt has been unemployed for the past 2 years, 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 Musician: No difficulties    Copywriter, advertising Arousal/Alertness: Awake/alert Behavior During Therapy: WFL for tasks assessed/performed Overall Cognitive Status: Within Functional  Limits for tasks assessed    Extremity/Trunk Assessment Right Lower Extremity Assessment RLE ROM/Strength/Tone: WFL for tasks assessed RLE Sensation: WFL - Light Touch RLE Coordination: WFL - gross motor Left Lower Extremity Assessment LLE ROM/Strength/Tone: Deficits LLE ROM/Strength/Tone Deficits: patient maintains L knee flexed at 70 degrees and I was only able to get it to -40 degrees extension due to pt screaming in pain...he was unable to tolerate any touch to the foot and was only able to flicker the great toe in regards to active motion. LLE Sensation: WFL - Light Touch   Balance    End of Session PT - End of Session Activity Tolerance: Patient limited by pain Patient left: in bed  GP     Myrlene Broker L 09/05/2012, 10:13 AM

## 2012-09-05 NOTE — Progress Notes (Signed)
Subjective: The patient says that the Solu-Medrol did decrease the pain in his left knee and foot a little. He also believes that the Voltaren gel is helping. He is tolerating his diet. No recent nausea, vomiting, or hematemesis.  Objective: Vital signs in last 24 hours: Filed Vitals:   09/04/12 0633 09/04/12 1418 09/04/12 2201 09/05/12 0500  BP: 116/70 137/85 151/77 96/56  Pulse:  79 91 90  Temp: 97.7 F (36.5 C) 97.7 F (36.5 C) 98.3 F (36.8 C) 97.7 F (36.5 C)  TempSrc: Oral Oral Oral Oral  Resp: 20 20 20 14   Height:      Weight:      SpO2: 95% 93% 97% 97%    Intake/Output Summary (Last 24 hours) at 09/05/12 1222 Last data filed at 09/04/12 2201  Gross per 24 hour  Intake    480 ml  Output   1100 ml  Net   -620 ml    Weight change:   Physical exam: General: Morbidly Obese 27 year old Caucasian man sitting up in bed, in no acute distress. Lungs: Clear to auscultation bilaterally.  Heart: S1, S2, with no murmurs rubs or gallops. Abdomen: Morbidly obese, positive bowel sounds, soft, nontender, nondistended. Extremities: Moderate tenderness over the left knee globally. Moderate tenderness over the left foot globally. No acute hot red joint noted. Psychiatric/neurologic: He not tearful today.  Lab Results: Basic Metabolic Panel:  Recent Labs  16/10/96 0537  NA 142  K 3.2*  CL 102  CO2 33*  GLUCOSE 121*  BUN 6  CREATININE 0.53  CALCIUM 8.6   Liver Function Tests: No results found for this basename: AST, ALT, ALKPHOS, BILITOT, PROT, ALBUMIN,  in the last 72 hours No results found for this basename: LIPASE, AMYLASE,  in the last 72 hours No results found for this basename: AMMONIA,  in the last 72 hours CBC:  Recent Labs  09/04/12 0621 09/05/12 0537  WBC 7.1 10.9*  HGB 9.2* 9.4*  HCT 28.3* 28.5*  MCV 87.1 85.8  PLT 283 316   Cardiac Enzymes: No results found for this basename: CKTOTAL, CKMB, CKMBINDEX, TROPONINI,  in the last 72 hours BNP: No  results found for this basename: PROBNP,  in the last 72 hours D-Dimer: No results found for this basename: DDIMER,  in the last 72 hours CBG: No results found for this basename: GLUCAP,  in the last 72 hours Hemoglobin A1C: No results found for this basename: HGBA1C,  in the last 72 hours Fasting Lipid Panel: No results found for this basename: CHOL, HDL, LDLCALC, TRIG, CHOLHDL, LDLDIRECT,  in the last 72 hours Thyroid Function Tests: No results found for this basename: TSH, T4TOTAL, FREET4, T3FREE, THYROIDAB,  in the last 72 hours Anemia Panel: No results found for this basename: VITAMINB12, FOLATE, FERRITIN, TIBC, IRON, RETICCTPCT,  in the last 72 hours Coagulation: No results found for this basename: LABPROT, INR,  in the last 72 hours Urine Drug Screen: Drugs of Abuse  No results found for this basename: labopia,  cocainscrnur,  labbenz,  amphetmu,  thcu,  labbarb    Alcohol Level: No results found for this basename: ETH,  in the last 72 hours Urinalysis: No results found for this basename: COLORURINE, APPERANCEUR, LABSPEC, PHURINE, GLUCOSEU, HGBUR, BILIRUBINUR, KETONESUR, PROTEINUR, UROBILINOGEN, NITRITE, LEUKOCYTESUR,  in the last 72 hours Misc. Labs:   Micro: Recent Results (from the past 240 hour(s))  MRSA PCR SCREENING     Status: None   Collection Time    09/01/12  1:41 PM      Result Value Range Status   MRSA by PCR NEGATIVE  NEGATIVE Final   Comment:            The GeneXpert MRSA Assay (FDA     approved for NASAL specimens     only), is one component of a     comprehensive MRSA colonization     surveillance program. It is not     intended to diagnose MRSA     infection nor to guide or     monitor treatment for     MRSA infections.    Studies/Results: Dg Knee Left Port  09/04/2012   *RADIOLOGY REPORT*  Clinical Data: Acute pain.  Question gout.  PORTABLE LEFT KNEE - 1-2 VIEW  Comparison: None  Findings: AP and attempted lateral views.  AP is centered low.   The lateral view is moderately oblique, and also centered low.  This is essentially nondiagnostic.  No gross abnormality identified on the AP view.  Joint effusion cannot be evaluated/excluded.  IMPRESSION: Severely degraded exam.  No definite acute finding.   Original Report Authenticated By: Jeronimo Greaves, M.D.    Medications:  Scheduled: . colchicine  0.6 mg Oral BID  . diclofenac sodium  2 g Topical TID  . pantoprazole  40 mg Oral BID AC  . potassium chloride  30 mEq Oral BID  . sodium chloride  3 mL Intravenous Q12H   Continuous:  ZOX:[id number], acetaminophen, HYDROcodone-acetaminophen, morphine injection, ondansetron (ZOFRAN) IV, ondansetron  Assessment: Principal Problem:   Gastric ulcer with hemorrhage Active Problems:   Hematemesis   Acute gouty arthritis   Morbid obesity   Upper GI bleed   Acute blood loss anemia   Cigarette smoker   Hypokalemia   1. Upper GI bleed secondary to gastric ulcer. Likely NSAID induced. Status post EGD on 5/18 with bleeding control therapy. H. pylori serologies negative. Protonix infusion 72 hours completed. He is now on twice a day dosing of oral Protonix. The gastroenterology team will schedule him for a followup EGD in 8-12 weeks.  Acute blood loss anemia. Patient's hemoglobin was 11.7 on admission. It has trended downward, but has been stable over the past 24 hours. No indication for transfusion.  Acute gouty arthritis. He was given 80 mg of Solu-Medrol on 09/04/2012. Would prefer ongoing steroid treatment, but this is limited due to to his peptic ulcer disease. We'll continue twice a day dosing of colchicine and topical Voltaren.   Cigarette smoker/tobacco abuse. He was advised to stop smoking.  Morbid obesity. 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 Physical therapy evaluation and recommendations noted. We'll plan for skilled nursing facility placement for  ongoing rehabilitation.   Plan:  1. Continue current management. 2. Plan for skilled nursing facility placement when that is available.   LOS: 4 days   Cristian Grieves 09/05/2012, 12:22 PM

## 2012-09-05 NOTE — Progress Notes (Signed)
Subjective:  Complains of left leg pain. ?Less frequent stools over last 12 hours with colchicine dose reduction. Received single dose of IV solumedrol.  No abdominal pain. No signs of overt GI bleeding. Tolerating diet.   Objective: Vital signs in last 24 hours: Temp:  [97.7 F (36.5 C)-98.3 F (36.8 C)] 97.7 F (36.5 C) (05/22 0500) Pulse Rate:  [79-91] 90 (05/22 0500) Resp:  [14-20] 14 (05/22 0500) BP: (96-151)/(56-85) 96/56 mmHg (05/22 0500) SpO2:  [93 %-97 %] 97 % (05/22 0500) Last BM Date: 09/04/12 General:   Alert,  Well-developed, well-nourished, pleasant and cooperative in NAD Head:  Normocephalic and atraumatic. Eyes:  Sclera clear, no icterus.   Abdomen:  Soft, nontender and nondistended. No masses, hepatosplenomegaly or hernias noted. Normal bowel sounds, without guarding, and without rebound.   Extremities:  Without clubbing, deformity or edema. Neurologic:  Alert and  oriented x4;  grossly normal neurologically. Skin:  Intact without significant lesions or rashes. Psych:  Alert and cooperative. Normal mood and affect.  Intake/Output from previous day: 05/21 0701 - 05/22 0700 In: 720 [P.O.:720] Out: 1400 [Urine:1400] Intake/Output this shift:    Lab Results: CBC  Recent Labs  09/03/12 0456 09/04/12 0621 09/05/12 0537  WBC 9.5 7.1 10.9*  HGB 9.7* 9.2* 9.4*  HCT 29.9* 28.3* 28.5*  MCV 86.2 87.1 85.8  PLT 288 283 316   BMET  Recent Labs  09/05/12 0537  NA 142  K 3.2*  CL 102  CO2 33*  GLUCOSE 121*  BUN 6  CREATININE 0.53  CALCIUM 8.6   LFTs No results found for this basename: BILITOT, BILIDIR, IBILI, ALKPHOS, AST, ALT, PROT, ALBUMIN,  in the last 72 hours No results found for this basename: LIPASE,  in the last 72 hours PT/INR No results found for this basename: LABPROT, INR,  in the last 72 hours    Imaging Studies: Dg Knee Left Port  09/04/2012   *RADIOLOGY REPORT*  Clinical Data: Acute pain.  Question gout.  PORTABLE LEFT KNEE - 1-2 VIEW   Comparison: None  Findings: AP and attempted lateral views.  AP is centered low.  The lateral view is moderately oblique, and also centered low.  This is essentially nondiagnostic.  No gross abnormality identified on the AP view.  Joint effusion cannot be evaluated/excluded.  IMPRESSION: Severely degraded exam.  No definite acute finding.   Original Report Authenticated By: Jeronimo Greaves, M.D.  [2 weeks]   Assessment: 27 year old male with GI bleed secondary to gastric ulcer in the setting of NSAID use, s/p bleeding control therapy via EGD on 5/18. Negative H.pylori serologies. Protonix drip completed.  PPI BID as an outpatient. Will need outpatient EGD in 3 months for surveillance of ulcer healing. Patient continues to report gout-like pain of his left foot/left knee, which is his typical presentation with gout. Colchicine not helping and causing GI upset, dose reduced with ?improvement of diarrhea. Received single dose of IV solumedrol yesterday. Moderate short-term corticosteroid therapy like okay per Dr. Jena Gauss.  Plan: 1. PPI BID, continue as outpatient.  2. EGD in 3 months, we will make arrangements for follow-up. 3. No NSAIDS.  4. Follow peripherally.   LOS: 4 days   Tana Coast  09/05/2012, 9:06 AM

## 2012-09-05 NOTE — Telephone Encounter (Signed)
Pt is aware of OV on 8/22 at 1030 with [facility] and appt card was mailed

## 2012-09-05 NOTE — Telephone Encounter (Signed)
Patient is currently inpatient with h/o bleeding gastric ulcer. He needs f/u OV (E30) in 8 weeks to schedule his 3 month f/u EGD.

## 2012-09-06 LAB — CBC
HCT: 31 % — ABNORMAL LOW (ref 39.0–52.0)
Hemoglobin: 10 g/dL — ABNORMAL LOW (ref 13.0–17.0)
MCH: 28.3 pg (ref 26.0–34.0)
MCHC: 32.3 g/dL (ref 30.0–36.0)
MCV: 87.8 fL (ref 78.0–100.0)
RBC: 3.53 MIL/uL — ABNORMAL LOW (ref 4.22–5.81)

## 2012-09-06 MED ORDER — DICLOFENAC SODIUM 1 % TD GEL: 2.0000 g | Freq: Three times a day (TID) | TRANSDERMAL | Status: DC

## 2012-09-06 MED ORDER — COLCHICINE 0.6 MG PO TABS: 0.6000 mg | ORAL_TABLET | Freq: Two times a day (BID) | ORAL | Status: DC

## 2012-09-06 MED ORDER — PANTOPRAZOLE SODIUM 40 MG PO TBEC: 40.0000 mg | DELAYED_RELEASE_TABLET | Freq: Two times a day (BID) | ORAL | Status: DC

## 2012-09-06 MED ORDER — HYDROCODONE-ACETAMINOPHEN 5-325 MG PO TABS: 1.0000 | ORAL_TABLET | Freq: Four times a day (QID) | ORAL | Status: DC | PRN

## 2012-09-06 NOTE — Clinical Social Work Note (Signed)
CSW met with pt earlier this morning regarding no bed offers within 50 miles of [facility]. He states he cannot go home but does not want to go further away. He reluctantly agreed to look at it. CSW faxed out to 100 miles. No bed offers. CSW called multiple facilities who have been known to accept bariatric patients, all turned down. CSW notified pt again. He was sitting at the edge of the bed and started rocking back and forth when CSW entered, saying he was in pain. He is aware that there are no SNF bed offers. CSW provided list of shelters to pt as he said he has no where to go. Pt states he will leave and come back to the ED if he is d/c because he will not stay in a shelter under any circumstances. Pt pleaded to stay until tomorrow because he can then go back to his friend's house, they are just not home today. CSW told him he would be d/c when medically stable. Pt's mother lives locally and CSW asked if he could go there for the night. Pt states that she has no room because it is a one bedroom house.  CSW supervisor aware of situation. MD and [facility] notified of above.   Derenda Fennel, [place] [phone]

## 2012-09-06 NOTE — Progress Notes (Signed)
09/06/12 1829 Late entry for 1630.  Patient was to be discharged home this afternoon, given list of local shelters per social work, since unable to return to friends home today. Patient had orders for home bariatric walker and bedside commode for discharge. Discussed with case management this afternoon, stated would need to be special ordered, unable to have delivered today for discharge. Case management stated patient would most likely not be able to have equipment delivered to shelter, and they may not be equipped to help manage his needs since unable to ambulate independently at this time. Charge nurse discussed with social worker this afternoon and also checked on possibility of obtaining one night hotel stay for patient which was not possible at this time. Dr. Sherrie Mustache notified of discharge plan issue and unable to have home equipment delivered today for discharge. Dr. Sherrie Mustache then requested to discuss with case management and charge nurse. Discharge to be held at this time until other arrangements made. Discussed with patient this afternoon, stated "my friends got married today and they will be back home tomorrow after 2 pm, so I can go back there then".  States unable to live with his mother because "she's disabled and having trouble paying her own rent".  Discharge on hold at this time. Earnstine Regal, RN

## 2012-09-06 NOTE — Progress Notes (Signed)
Subjective: The patient has slightly less pain in his knee. Analgesics and Voltaren are helping. She is still unable to bear much weight on the left leg.  Objective: Vital signs in last 24 hours: Filed Vitals:   09/05/12 1421 09/05/12 2233 09/06/12 0627 09/06/12 1401  BP: 142/75 127/77 124/72 120/76  Pulse: 80 69 68 68  Temp: 98.5 F (36.9 C) 98 F (36.7 C) 97.9 F (36.6 C) 97.8 F (36.6 C)  TempSrc: Oral Oral Oral Oral  Resp: 18 18 20 20   Height:      Weight:      SpO2: 97% 98% 97% 96%    Intake/Output Summary (Last 24 hours) at 09/06/12 1636 Last data filed at 09/06/12 1046  Gross per 24 hour  Intake    243 ml  Output   1000 ml  Net   -757 ml    Weight change:   Physical exam: General: Morbidly Obese 27 year old Caucasian man sitting up in bed, in no acute distress. Lungs: Clear to auscultation bilaterally.  Heart: S1, S2, with no murmurs rubs or gallops. Abdomen: Morbidly obese, positive bowel sounds, soft, nontender, nondistended. Extremities: Moderate tenderness over the left knee globally. Moderate tenderness over the left foot globally. No acute hot red joint noted. No significant effusion noted. Psychiatric/neurologic: He slightly tearful.  Lab Results: Basic Metabolic Panel:  Recent Labs  16/10/96 0537  NA 142  K 3.2*  CL 102  CO2 33*  GLUCOSE 121*  BUN 6  CREATININE 0.53  CALCIUM 8.6   Liver Function Tests: No results found for this basename: AST, ALT, ALKPHOS, BILITOT, PROT, ALBUMIN,  in the last 72 hours No results found for this basename: LIPASE, AMYLASE,  in the last 72 hours No results found for this basename: AMMONIA,  in the last 72 hours CBC:  Recent Labs  09/05/12 0537 09/06/12 0550  WBC 10.9* 8.3  HGB 9.4* 10.0*  HCT 28.5* 31.0*  MCV 85.8 87.8  PLT 316 321   Cardiac Enzymes: No results found for this basename: CKTOTAL, CKMB, CKMBINDEX, TROPONINI,  in the last 72 hours BNP: No results found for this basename: PROBNP,  in the  last 72 hours D-Dimer: No results found for this basename: DDIMER,  in the last 72 hours CBG: No results found for this basename: GLUCAP,  in the last 72 hours Hemoglobin A1C: No results found for this basename: HGBA1C,  in the last 72 hours Fasting Lipid Panel: No results found for this basename: CHOL, HDL, LDLCALC, TRIG, CHOLHDL, LDLDIRECT,  in the last 72 hours Thyroid Function Tests: No results found for this basename: TSH, T4TOTAL, FREET4, T3FREE, THYROIDAB,  in the last 72 hours Anemia Panel: No results found for this basename: VITAMINB12, FOLATE, FERRITIN, TIBC, IRON, RETICCTPCT,  in the last 72 hours Coagulation: No results found for this basename: LABPROT, INR,  in the last 72 hours Urine Drug Screen: Drugs of Abuse  No results found for this basename: labopia,  cocainscrnur,  labbenz,  amphetmu,  thcu,  labbarb    Alcohol Level: No results found for this basename: ETH,  in the last 72 hours Urinalysis: No results found for this basename: COLORURINE, APPERANCEUR, LABSPEC, PHURINE, GLUCOSEU, HGBUR, BILIRUBINUR, KETONESUR, PROTEINUR, UROBILINOGEN, NITRITE, LEUKOCYTESUR,  in the last 72 hours Misc. Labs:   Micro: Recent Results (from the past 240 hour(s))  MRSA PCR SCREENING     Status: None   Collection Time    09/01/12  1:41 PM      Result Value  Range Status   MRSA by PCR NEGATIVE  NEGATIVE Final   Comment:            The GeneXpert MRSA Assay (FDA     approved for NASAL specimens     only), is one component of a     comprehensive MRSA colonization     surveillance program. It is not     intended to diagnose MRSA     infection nor to guide or     monitor treatment for     MRSA infections.    Studies/Results: No results found.  Medications:  Scheduled: . colchicine  0.6 mg Oral BID  . diclofenac sodium  2 g Topical TID  . pantoprazole  40 mg Oral BID AC  . sodium chloride  3 mL Intravenous Q12H   Continuous:  [id number], acetaminophen,  HYDROcodone-acetaminophen, morphine injection, ondansetron (ZOFRAN) IV, ondansetron  Assessment: Principal Problem:   Gastric ulcer with hemorrhage Active Problems:   Hematemesis   Acute gouty arthritis   Morbid obesity   Upper GI bleed   Acute blood loss anemia   Cigarette smoker   Hypokalemia   1. Upper GI bleed secondary to gastric ulcer. Likely NSAID induced. Status post EGD on 5/18 with bleeding control therapy. H. pylori serologies negative. Protonix infusion 72 hours completed. He is now on twice a day dosing of oral Protonix. The gastroenterology team will schedule him for a followup EGD in 8-12 weeks.  Acute blood loss anemia. Patient's hemoglobin was 11.7 on admission. It has trended downward, but has been stable over the past 24 hours. No indication for transfusion.  Acute gouty arthritis. He was given 80 mg of Solu-Medrol on 09/04/2012. Would prefer ongoing steroid treatment, but this is limited due to to his peptic ulcer disease. We'll continue twice a day dosing of colchicine and topical Voltaren.   Cigarette smoker/tobacco abuse. He was advised to stop smoking.  Morbid obesity. 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 Physical therapy evaluation and recommendations noted. Although skilled nursing facility placement was recommended and the patient accepted it, no nursing home has accepting him yet. The case management/social worker staff and myself have discussed disposition with him. Referring him to a local shelter or a local hotel was considered, but the patient is virtually nonambulatory. Returning back to his mother's home was encouraged, but he stated that his mother did not want him living with her. He had been living with friends. However, according to him, his friends are getting married today and he would be unable to return to their home.   Plan:  1. Continue current management. 2.  Hopefully the patient can be discharged tomorrow to his friend's home. Otherwise, we'll continue to ask case management/social work colleagues to assist with disposition. 3. Outpatient GI followup has been scheduled.   LOS: 5 days   [name] 09/06/2012, 4:36 PM

## 2012-09-06 NOTE — Progress Notes (Signed)
Friend visiting in room tonight. States he would let Casimiro Needle stay with him if his usual place to stay falls through. Kristen Loader is name of friend. Phone # [phone].

## 2012-09-06 NOTE — Progress Notes (Signed)
Physical Therapy Treatment Patient Details Name: [name] MRN: [id number] DOB: 1985/09/09 Today's Date: 09/06/2012 Time:  -     PT Assessment / Plan / Recommendation Comments on Treatment Session  Pt stated he was too tired for PT.  Pt reported he got no rest last night and does not wish to do therapy at this time.       Visit Information  Reason Eval/Treat Not Completed: Other (comment) (Pt refused PT stating he was too tired)    Subjective Data  Subjective: I got no sleep last night and do not wish to do therapy at this time;     End of Session PT - End of Session Patient left: in bed;with call bell/phone within reach   GP     Juel Burrow 09/06/2012, 11:00 AM

## 2012-09-07 ENCOUNTER — Encounter (HOSPITAL_COMMUNITY): Payer: Self-pay | Admitting: Internal Medicine

## 2012-09-07 DIAGNOSIS — E876 Hypokalemia: Secondary | ICD-10-CM

## 2012-09-07 DIAGNOSIS — K2210 Ulcer of esophagus without bleeding: Secondary | ICD-10-CM | POA: Diagnosis present

## 2012-09-07 DIAGNOSIS — K250 Acute gastric ulcer with hemorrhage: Secondary | ICD-10-CM

## 2012-09-07 LAB — CBC
HCT: 33.3 % — ABNORMAL LOW (ref 39.0–52.0)
MCH: 28.4 pg (ref 26.0–34.0)
MCHC: 32.4 g/dL (ref 30.0–36.0)
MCV: 87.6 fL (ref 78.0–100.0)
Platelets: 360 10*3/uL (ref 150–400)
RDW: 14 % (ref 11.5–15.5)

## 2012-09-07 LAB — SEDIMENTATION RATE: Sed Rate: 38 mm/hr — ABNORMAL HIGH (ref 0–16)

## 2012-09-07 LAB — BASIC METABOLIC PANEL
BUN: 10 mg/dL (ref 6–23)
Calcium: 8.7 mg/dL (ref 8.4–10.5)
Chloride: 100 mEq/L (ref 96–112)
Creatinine, Ser: 0.71 mg/dL (ref 0.50–1.35)
GFR calc Af Amer: >90 mL/min (ref >90)

## 2012-09-07 MED ORDER — POTASSIUM CHLORIDE CRYS ER 20 MEQ PO TBCR
30.0000 meq | EXTENDED_RELEASE_TABLET | Freq: Two times a day (BID) | ORAL | Status: DC
  Administered 2012-09-07: 30 meq via ORAL
  Filled 2012-09-07: qty 1

## 2012-09-07 NOTE — Discharge Summary (Signed)
Physician Discharge Summary  [name] NFA:[id number] DOB: 1986-04-13 DOA: 09/01/2012  PCP: No primary provider on file.  Admit date: 09/01/2012 Discharge date: 09/07/2012  Time spent: Greater than 30 minutes  Recommendations for Outpatient Follow-up:  1. The patient was instructed to followup with Dr. Jena Gauss as scheduled. Planned EGD to be repeated in 3 months. 2. He was instructed to followup with the health department for primary care. 3. He was instructed to avoid all NSAIDs.  Discharge Diagnoses:   1. Hematemesis/upper GI bleed secondary to erosive esophagitis and gastric ulcer with hemorrhage (see below). Likely NSAID induced. 2. EGD per Dr. Jena Gauss on 09/01/2012 revealed distal esophageal erosions consistent with erosive reflux esophagitis, prepyloric benign-appearing gastric ulcer with bleeding stigmata, status post epinephrine injection through Gold probe, status post 3 resolution clips applied. 3. Acute blood loss anemia, status post transfusion of one unit of packed red blood cells. 4. Acute gouty arthritis. 5. Morbid obesity. 6. Cigarette smoker/tobacco abuse. The patient was advised to stop smoking. 7. Hypokalemia, likely secondary to IV fluids. 8. Gait disorder secondary to morbid obesity and gout.  Discharge Condition: Improved.  Diet recommendation: Low-fat.  Filed Weights   09/01/12 1243 09/01/12 1458 09/02/12 0500  Weight: 182.3 kg (401 lb 14.4 oz) 181.892 kg (401 lb) 189.6 kg (417 lb 15.9 oz)    History of present illness:  The patient is a 62 are old man with a history significant for gout and morbid obesity, who presented to the emergency department on 09/01/2012 with a chief complaint of vomiting blood. He has had recent flareups with his gout. He had been taking Aleve and ibuprofen virtually nonstop for 3 days. Subsequently, he developed several episodes of nausea and vomiting, with 2 episodes of frank bright blood with clots. He denied abdominal pain, chest  pain, or shortness of breath. In the emergency department, he was tachycardic with a heart rate in the 120s to 140s, but his blood pressure remained stable. He was afebrile. His lab data were significant for a BUN of 27, WBC of 10.7, hemoglobin of 11.7 and EKG that revealed sinus tachycardia but with no other acute changes. He was admitted for further evaluation and management.   Hospital Course:  The patient was admitted to the step down unit. He was made to be n.p.o. IV Protonix infusion was started. His CBC was monitored frequently. He was typed and screened for packed red blood cells. He was counseled on discontinuation of NSAID use. Gastroenterologist, Dr. Jena Gauss was consulted shortly after admission. Shortly thereafter, he performed an EGD. The results of the EGD with bleeding control therapy were dictated above. IV Protonix infusion for 72 hours and a clear liquid diet were ordered. H. pylori serologies were ordered as well.    1. Upper GI bleed secondary to gastric ulcer. The patient's ulcer was NSAID induced. He was instructed to stop taking all NSAIDs. Following the IV Protonix infusion for 3 days, it was discontinued in favor of twice a day dosing of oral Protonix. He improved clinically and symptomatically. His diet was advanced which he tolerated well. A followup EGD will be planned in 3 months per the gastroenterology team. His H. pylori serology was negative.   2. Acute blood loss anemia.  Patient's hemoglobin was 11.7 on admission. It trended downward to 9.2. He was transfused one unit of packed red blood cells. His hemoglobin improved appropriately. It was 10.8 at the time of discharge.   3. Acute gouty arthritis. The patient was started on  3 times a day colchicine for acute gouty flareup of his left knee and left foot. With the onset of loose stools and abdominal cramping, the dose was decreased to twice a day. Although steroids were relatively contraindicated, in order to give the  patient some relief, one dose of IV Solu-Medrol was given only after the patient's hemoglobin had remained stable and after he had received at least 48 hours of IV Protonix. In addition, Voltaren topical gel was started. His pain was also treated with as needed opiates. He did receive symptomatic relief, but he was not completely pain-free.   4. Cigarette smoker/tobacco abuse. He was advised to stop smoking.   5. Morbid obesity. The patient is greater than 400 pounds. 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 Physical therapy evaluation and recommendations were noted for skilled nursing facility placement for rehabilitation. Because of his size, no skilled nursing facility accepted him. He had been living with some friends who apparently had great apprehension about bringing him back to their home given that he was virtually nonambulatory although he was becoming more able to transfer as his pain subsided. The case management/social worker staff and myself have discussed disposition with him. Referring him to a local shelter or a local hotel was considered, but the patient was virtually nonambulatory. Returning back to his mother's home was encouraged, but he stated that his mother did not want him living with her. Eventually, his friends agreed allow him to stay with him again. A rolling walker, cane, and bedside commode were ordered, but he could not afford either. He was strongly encouraged to try to lose weight.     Procedures:  EGD with bleeding control therapy per Dr. Jena Gauss  Consultations:  Gastroenterologists Dr. Jena Gauss and Dr. Darrick Penna  Discharge Exam: Filed Vitals:   09/06/12 1401 09/06/12 2238 09/07/12 0714 09/07/12 1400  BP: 120/76 145/86 118/71 126/76  Pulse: 68 82 78 76  Temp: 97.8 F (36.6 C) 97.5 F (36.4 C) 97.2 F (36.2 C) 97.6 F (36.4 C)  TempSrc: Oral Oral Oral Oral  Resp: 20 20 20 20   Height:       Weight:      SpO2: 96% 97% 95% 96%    General: Morbidly obese Caucasian man laying in bed, in no acute distress. Cardiovascular: S1, S2, with no murmurs rubs or gallops. Respiratory: Clear to auscultation bilaterally. Abdomen: Morbidly obese, positive bowel sounds, soft, nontender, nondistended. Extremities: Significantly less tenderness of his left knee and left foot. Trace of pedal edema bilaterally.  Discharge Instructions  Discharge Orders   Future Appointments Provider Department Dept Phone   12/06/2012 10:30 AM [facility] [phone]   Future Orders Complete By Expires     Diet - low sodium heart healthy  As directed     Discharge instructions  As directed     Comments:      Avoid all aspirin-like products including ibuprofen, naproxen, Motrin, Excedrin, Advil, BC powders, Goody's powders, aspirin, and the like.    Increase activity slowly  As directed         Medication List    STOP taking these medications       ALEVE 220 MG tablet  Generic drug:  naproxen sodium     ibuprofen 800 MG tablet  Commonly known as:  ADVIL,MOTRIN      TAKE these medications       colchicine 0.6  MG tablet  Take 1 tablet (0.6 mg total) by mouth 2 (two) times daily.     diclofenac sodium 1 % Gel  Commonly known as:  VOLTAREN  Apply 2 g topically 3 (three) times daily.     HYDROcodone-acetaminophen 5-325 MG per tablet  Commonly known as:  NORCO/VICODIN  Take 1-2 tablets by mouth every 6 (six) hours as needed.     pantoprazole 40 MG tablet  Commonly known as:  PROTONIX  Take 1 tablet (40 mg total) by mouth 2 (two) times daily before a meal.       Allergies  Allergen Reactions  . Asa (Aspirin)     Pt doesn't know reaction.  Marland Kitchen Penicillins     Pt doesn't know reaction.  . Sulfa Antibiotics     Pt doesn't know reaction.       Follow-up Information   Follow up with Eula Listen, MD On 12/06/2012. (AT 10:30 AM)    Contact  information:   [address] [place] [address] [phone]        The results of significant diagnostics from this hospitalization (including imaging, microbiology, ancillary and laboratory) are listed below for reference.    Significant Diagnostic Studies: Dg Knee Left Port  09/04/2012   *RADIOLOGY REPORT*  Clinical Data: Acute pain.  Question gout.  PORTABLE LEFT KNEE - 1-2 VIEW  Comparison: None  Findings: AP and attempted lateral views.  AP is centered low.  The lateral view is moderately oblique, and also centered low.  This is essentially nondiagnostic.  No gross abnormality identified on the AP view.  Joint effusion cannot be evaluated/excluded.  IMPRESSION: Severely degraded exam.  No definite acute finding.   Original Report Authenticated By: Jeronimo Greaves, M.D.    Microbiology: Recent Results (from the past 240 hour(s))  MRSA PCR SCREENING     Status: None   Collection Time    09/01/12  1:41 PM      Result Value Range Status   MRSA by PCR NEGATIVE  NEGATIVE Final   Comment:            The GeneXpert MRSA Assay (FDA     approved for NASAL specimens     only), is one component of a     comprehensive MRSA colonization     surveillance program. It is not     intended to diagnose MRSA     infection nor to guide or     monitor treatment for     MRSA infections.     Labs: Basic Metabolic Panel:  Recent Labs Lab 09/01/12 1040 09/02/12 0307 09/05/12 0537 09/07/12 0703  NA 139 138 142 142  K 4.1 3.6 3.2* 3.3*  CL 101 100 102 100  CO2 27 28 33* 34*  GLUCOSE 114* 112* 121* 98  BUN 27* 12 6 10   CREATININE 0.59 0.54 0.53 0.71  CALCIUM 9.0 8.7 8.6 8.7   Liver Function Tests: No results found for this basename: AST, ALT, ALKPHOS, BILITOT, PROT, ALBUMIN,  in the last 168 hours No results found for this basename: LIPASE, AMYLASE,  in the last 168 hours No results found for this basename: AMMONIA,  in the last 168 hours CBC:  Recent  Labs Lab 09/01/12 1040  09/03/12 0456 09/04/12 0621 09/05/12 0537 09/06/12 0550 09/07/12 0703  WBC 10.7*  < > 9.5 7.1 10.9* 8.3 8.7  NEUTROABS 7.7  --   --   --   --   --   --  HGB 11.7*  < > 9.7* 9.2* 9.4* 10.0* 10.8*  HCT 36.0*  < > 29.9* 28.3* 28.5* 31.0* 33.3*  MCV 86.1  < > 86.2 87.1 85.8 87.8 87.6  PLT 355  < > 288 283 316 321 360  < > = values in this interval not displayed. Cardiac Enzymes: No results found for this basename: CKTOTAL, CKMB, CKMBINDEX, TROPONINI,  in the last 168 hours BNP: BNP (last 3 results) No results found for this basename: PROBNP,  in the last 8760 hours CBG: No results found for this basename: GLUCAP,  in the last 168 hours     Signed:  Zaki Gertsch  [facility] 09/07/2012, 5:57 PM

## 2012-09-07 NOTE — Progress Notes (Signed)
09/07/12 1117 Patient to have crutches for discharge if possible per MD. Crutches for home use from [employer] $75 and from [facility] $79. Patient has no insurance and states unable to afford crutches, has no one to help him pay for them at this time. Unable to provide patient with voucher for home equipment per case management discussion yesterday. Text-paged Dr Sherrie Mustache to notify. Patient stated friends should be home this afternoon around 3 pm and he will notify nursing staff when they are coming to pick him up for discharge home. Earnstine Regal, RN

## 2012-09-07 NOTE — Progress Notes (Signed)
09/07/12 1936 Patient discharged home this evening. Pt provided with crutches for home use per nursing supervisor prior to discharge. Crutches adjusted for correct height, pt able to ambulate in room with them. Stated comfortable. Pt left floor in stable condition via w/c accompanied by nurse tech. Earnstine Regal, RN

## 2012-09-07 NOTE — Progress Notes (Signed)
Subjective; Patient feels better. He complains of "stomach ache" and points to right midabdomen. He denies nausea vomiting or heartburn. He ate most of his breakfast. Stool was black yesterday but no bowel movement reported today. Patient complains of pain in small joints of both hands as well as knees and ankles. He states he's been taking Aleve 4-8 tablets daily for 4 years. Objective; BP 118/71  Pulse 78  Temp(Src) 97.2 F (36.2 C) (Oral)  Resp 20  Ht 5\' 7"  (1.702 m)  Wt 417 lb 15.9 oz (189.6 kg)  BMI 65.45 kg/m2  SpO2 95% Abdomen is obese but soft and nontender. He has swelling to PIP of left fourth finger and some tenderness at metacarpophalangeal joints of both hands. Lab data; H&H 10.8 and 33.3 H. pylori serology is negative. Assessment; Upper GI bleed secondary to gastric ulcer status post endoscopic by Dr. Jena Gauss on 09/02/2012. Patient received single unit of PRBCs. Patient is going home today. He will followup EGD by Dr. Jena Gauss in 8-12 weeks. He has gouty arthritis. Also need to rule out rheumatoid arthritis. Will order a sedimentation rate ANA and rheumatoid factor.

## 2012-09-07 NOTE — Progress Notes (Signed)
09/07/12 1612 Patient being discharged home this afternoon. Reviewed discharge instructions with patient. Given copy of instructions, medication list, prescriptions, and education sheets for GI bleeding and gout. Medication voucher given as prepared per case management, pt aware voucher will not cover narcotic prescription. Given f/u appointment as scheduled with Dr Jena Gauss, provided patient with phone numbers for [facility] and [facility] since no current PCP for follow-up as needed. Stated would follow-up as needed. Verbalized understanding of instructions. Pt in stable condition awaiting friends arrival for discharge home. Earnstine Regal, RN

## 2012-09-13 ENCOUNTER — Emergency Department (HOSPITAL_COMMUNITY): Payer: Medicaid Other

## 2012-09-13 ENCOUNTER — Emergency Department (HOSPITAL_COMMUNITY)
Admission: EM | Admit: 2012-09-13 | Discharge: 2012-09-13 | Disposition: A | Discharge status: Home / Self Care | Payer: Medicaid Other | Attending: Emergency Medicine | Admitting: Emergency Medicine

## 2012-09-13 ENCOUNTER — Encounter (HOSPITAL_COMMUNITY): Payer: Self-pay | Admitting: *Deleted

## 2012-09-13 DIAGNOSIS — M79605 Pain in left leg: Secondary | ICD-10-CM

## 2012-09-13 DIAGNOSIS — R21 Rash and other nonspecific skin eruption: Secondary | ICD-10-CM | POA: Insufficient documentation

## 2012-09-13 DIAGNOSIS — Z8719 Personal history of other diseases of the digestive system: Secondary | ICD-10-CM | POA: Insufficient documentation

## 2012-09-13 DIAGNOSIS — Z872 Personal history of diseases of the skin and subcutaneous tissue: Secondary | ICD-10-CM | POA: Insufficient documentation

## 2012-09-13 DIAGNOSIS — R51 Headache: Secondary | ICD-10-CM | POA: Insufficient documentation

## 2012-09-13 DIAGNOSIS — M549 Dorsalgia, unspecified: Secondary | ICD-10-CM | POA: Insufficient documentation

## 2012-09-13 DIAGNOSIS — M7989 Other specified soft tissue disorders: Secondary | ICD-10-CM | POA: Insufficient documentation

## 2012-09-13 DIAGNOSIS — Z880 Allergy status to penicillin: Secondary | ICD-10-CM | POA: Insufficient documentation

## 2012-09-13 DIAGNOSIS — E669 Obesity, unspecified: Secondary | ICD-10-CM | POA: Insufficient documentation

## 2012-09-13 DIAGNOSIS — R0602 Shortness of breath: Secondary | ICD-10-CM | POA: Insufficient documentation

## 2012-09-13 DIAGNOSIS — M79609 Pain in unspecified limb: Secondary | ICD-10-CM | POA: Insufficient documentation

## 2012-09-13 DIAGNOSIS — Z862 Personal history of diseases of the blood and blood-forming organs and certain disorders involving the immune mechanism: Secondary | ICD-10-CM | POA: Insufficient documentation

## 2012-09-13 DIAGNOSIS — M109 Gout, unspecified: Secondary | ICD-10-CM | POA: Insufficient documentation

## 2012-09-13 DIAGNOSIS — Z87891 Personal history of nicotine dependence: Secondary | ICD-10-CM | POA: Insufficient documentation

## 2012-09-13 MED ORDER — HYDROCODONE-ACETAMINOPHEN 5-325 MG PO TABS: 1.0000 | ORAL_TABLET | Freq: Four times a day (QID) | ORAL | Status: DC | PRN

## 2012-09-13 MED ORDER — HYDROCODONE-ACETAMINOPHEN 5-325 MG PO TABS
2.0000 | ORAL_TABLET | Freq: Once | ORAL | Status: AC
  Administered 2012-09-13: 2 via ORAL
  Filled 2012-09-13: qty 2

## 2012-09-13 NOTE — ED Notes (Signed)
Lt  Lower leg pain, recent adm for abd ulcer.  Hx gout.

## 2012-09-13 NOTE — ED Provider Notes (Signed)
History  This chart was scribed for Shelda Jakes, MD, by Candelaria Stagers, ED Scribe. This patient was seen in room [facility]04/[facility] and the patient's care was started at 4:15 PM   CSN: [id number]  Arrival date & time 09/13/12  1441   First MD Initiated Contact with Patient 09/13/12 1605      Chief Complaint  Patient presents with  . Leg Pain     The history is provided by the patient. No language interpreter was used.   HPI Comments: [name] is a 27 y.o. male who presents to the Emergency Department complaining of sharp constant left lower leg pain that started around 6AM today.  He describes the pain as 10/10.  He denies injury or trauma.  He reports over the last few months he has been experiencing left leg pain and has been using crutches recently.  Pt is currently experiencing an episode of gout to the left foot.  He reports walking makes the pain worse.  Pt has been taking Vicodin which he ran out of today.  He reports no relief of pain with Vicodin.  Pt takes colchicine for the gout.     Past Medical History  Diagnosis Date  . Gout   . Cellulitis   . Obesity   . Erosive esophagitis 09/01/2012    NSAID induced.  . Gastric ulcer with hemorrhage 09/02/2012    s/p bleeding control tx. Per Dr. Jena Gauss  . Acute blood loss anemia 09/02/2012    S/p 1 unit rbcs    Past Surgical History  Procedure Laterality Date  . Tonsillectomy    . Esophagogastroduodenoscopy Left 09/01/2012    Procedure: ESOPHAGOGASTRODUODENOSCOPY (EGD);  Surgeon: Corbin Ade, MD;  Location: [facility];  Service: Endoscopy;  Laterality: Left;    Family History  Problem Relation Age of Onset  . Heart failure Mother     History  Substance Use Topics  . Smoking status: Former Smoker -- 0.50 packs/day for 7 years    Types: Cigarettes  . Smokeless tobacco: Former Neurosurgeon    Types: Snuff  . Alcohol Use: Yes     Comment: very occasional      Review of Systems  Constitutional: Negative for  fever and chills.  HENT: Negative for congestion, sore throat and neck pain.   Respiratory: Positive for shortness of breath.   Cardiovascular: Positive for leg swelling (left leg). Negative for chest pain.  Gastrointestinal: Negative for nausea, vomiting, abdominal pain and diarrhea.  Genitourinary: Negative for dysuria.  Musculoskeletal: Positive for back pain and arthralgias (left lower leg pain).  Skin: Positive for color change (redness to left foot) and rash (to left lower leg).  Neurological: Positive for headaches.    Allergies  Asa; Penicillins; and Sulfa antibiotics  Home Medications   Current Outpatient Rx  Name  Route  Sig  Dispense  Refill  . colchicine 0.6 MG tablet   Oral   Take 1 tablet (0.6 mg total) by mouth 2 (two) times daily.   60 tablet   3   . diclofenac sodium (VOLTAREN) 1 % GEL   Topical   Apply 2 g topically 3 (three) times daily.   100 g   3   . HYDROcodone-acetaminophen (NORCO/VICODIN) 5-325 MG per tablet   Oral   Take 1-2 tablets by mouth every 6 (six) hours as needed.   30 tablet   0   . HYDROcodone-acetaminophen (NORCO/VICODIN) 5-325 MG per tablet   Oral   Take 1-2  tablets by mouth every 6 (six) hours as needed for pain.   20 tablet   0   . pantoprazole (PROTONIX) 40 MG tablet   Oral   Take 1 tablet (40 mg total) by mouth 2 (two) times daily before a meal.   60 tablet   6     BP 141/84  Pulse 103  Temp(Src) 98 F (36.7 C) (Oral)  Resp 20  Ht 5\' 7"  (1.702 m)  Wt 401 lb (181.892 kg)  BMI 62.79 kg/m2  Physical Exam  Nursing note and vitals reviewed. Constitutional: He is oriented to person, place, and time. He appears well-developed and well-nourished. No distress.  obese  HENT:  Head: Normocephalic and atraumatic.  Mouth/Throat: Oropharynx is clear and moist.  Eyes: EOM are normal.  Neck: Normal range of motion. Neck supple. No tracheal deviation present.  Cardiovascular: Normal rate and regular rhythm.   No murmur  heard. Pulmonary/Chest: Effort normal and breath sounds normal. No respiratory distress. He has no wheezes. He has no rales.  Abdominal: Soft. There is no tenderness.  Musculoskeletal: Normal range of motion.  Swelling on top of left foot with redness.  Back of left calf soft.  Left leg cap refill less than 3 seconds.   Neurological: He is alert and oriented to person, place, and time. No cranial nerve deficit.  Skin: Skin is warm and dry.  Psychiatric: He has a normal mood and affect. His behavior is normal.    ED Course  Procedures   DIAGNOSTIC STUDIES:   COORDINATION OF CARE:  4:18 PM Discussed course of care with pt which includes images of left leg and pain medication.  Will include doppler study of left leg.  Pt understands and agrees.   Labs Reviewed - No data to display Dg Tibia/fibula Left  09/13/2012   *RADIOLOGY REPORT*  Clinical Data:  Medial left lower leg pain and swelling since earlier today.  LEFT TIBIA AND FIBULA - 2 VIEW  Comparison: Left knee x-rays 07/26/2008.  No prior tibia-fibula imaging.  Findings: No evidence of acute fracture involving the tibia or fibula.  Well-preserved bone mineral density.  No intrinsic osseous abnormalities.  Visualized knee joint and ankle joint intact.  IMPRESSION: Normal examination.   Original Report Authenticated By: Hulan Saas, M.D.   US Venous Img Lower Unilateral Left  09/13/2012   *RADIOLOGY REPORT*  Clinical Data: Leg pain  LEFT LOWER EXTREMITY VENOUS DUPLEX ULTRASOUND  Technique:  Gray-scale sonography with graded compression, as well as color Doppler and duplex ultrasound, were performed to evaluate the deep venous system of the lower extremity from the level of the common femoral vein through the popliteal and proximal calf veins. Spectral Doppler was utilized to evaluate flow at rest and with distal augmentation maneuvers.  Comparison:  10/16/2007  Findings: The visualized left lower extremity deep venous system appears  patent.  Normal compressibility.  Patent color Doppler flow.  Satisfactory spectral Doppler with respiratory variation and response to augmentation.  IMPRESSION: No deep venous thrombosis in the visualized left lower extremity.   Original Report Authenticated By: Charline Bills, M.D.   Dg Foot Complete Left  09/13/2012   *RADIOLOGY REPORT*  Clinical Data: Left foot pain, prior history of gout.  LEFT FOOT - COMPLETE 3+ VIEW  Comparison: Left foot x-rays 08/30/2012 [facility].  Findings: Current examination was obtained with the toes in flexion.  Diffuse soft tissue swelling, increased since prior examination.  No evidence of acute or subacute fracture or dislocation.  Prominent talar beak.  Degenerative changes involving the midfoot.  IMPRESSION: No acute or subacute osseous abnormality.  Osteoarthritis.  Diffuse soft tissue swelling.   Original Report Authenticated By: Hulan Saas, M.D.     1. Leg pain, diffuse, left   2. Gouty arthritis of toe of left foot       MDM  No evidence of deep vein thrombosis based on ultrasound. X-rays of the left leg are negative for any bony injuries. Left foot shows a lot of soft tissue swelling this is most likely consistent with gout clinically appears to be gout there as well. Suspect that pain is radiating to the foot up her leg. Lahey has no significant swelling or erythema. Will treat with pain medications. Resource guide provided to help followup with a primary care Dr. The patient may return to the emergency department as needed.  I personally performed the services described in this documentation, which was scribed in my presence. The recorded information has been reviewed and is accurate.        Shelda Jakes, MD 09/13/12 1728

## 2012-09-16 ENCOUNTER — Inpatient Hospital Stay (HOSPITAL_COMMUNITY)
Admission: EM | Admit: 2012-09-16 | Discharge: 2012-09-18 | DRG: 603 | Disposition: A | Discharge status: Home / Self Care | Payer: Medicaid Other | Attending: Internal Medicine | Admitting: Internal Medicine

## 2012-09-16 ENCOUNTER — Encounter (HOSPITAL_COMMUNITY): Payer: Self-pay | Admitting: *Deleted

## 2012-09-16 DIAGNOSIS — L03119 Cellulitis of unspecified part of limb: Principal | ICD-10-CM | POA: Diagnosis present

## 2012-09-16 DIAGNOSIS — L0390 Cellulitis, unspecified: Secondary | ICD-10-CM | POA: Diagnosis present

## 2012-09-16 DIAGNOSIS — B353 Tinea pedis: Secondary | ICD-10-CM | POA: Diagnosis present

## 2012-09-16 DIAGNOSIS — Z6841 Body Mass Index (BMI) 40.0 and over, adult: Secondary | ICD-10-CM | POA: Diagnosis present

## 2012-09-16 DIAGNOSIS — L02419 Cutaneous abscess of limb, unspecified: Principal | ICD-10-CM | POA: Diagnosis present

## 2012-09-16 DIAGNOSIS — K2210 Ulcer of esophagus without bleeding: Secondary | ICD-10-CM

## 2012-09-16 DIAGNOSIS — Z66 Do not resuscitate: Secondary | ICD-10-CM | POA: Diagnosis present

## 2012-09-16 DIAGNOSIS — Z87891 Personal history of nicotine dependence: Secondary | ICD-10-CM

## 2012-09-16 DIAGNOSIS — L0291 Cutaneous abscess, unspecified: Secondary | ICD-10-CM

## 2012-09-16 DIAGNOSIS — K279 Peptic ulcer, site unspecified, unspecified as acute or chronic, without hemorrhage or perforation: Secondary | ICD-10-CM | POA: Diagnosis present

## 2012-09-16 DIAGNOSIS — M109 Gout, unspecified: Secondary | ICD-10-CM | POA: Diagnosis present

## 2012-09-16 LAB — CBC WITH DIFFERENTIAL/PLATELET
Basophils Relative: 0 % (ref 0–1)
Eosinophils Absolute: 0 10*3/uL (ref 0.0–0.7)
Eosinophils Relative: 0 % (ref 0–5)
Hemoglobin: 12.2 g/dL — ABNORMAL LOW (ref 13.0–17.0)
Lymphs Abs: 2.4 10*3/uL (ref 0.7–4.0)
MCH: 28.6 pg (ref 26.0–34.0)
MCHC: 33.9 g/dL (ref 30.0–36.0)
MCV: 84.3 fL (ref 78.0–100.0)
Monocytes Relative: 12 % (ref 3–12)
Neutrophils Relative %: 75 % (ref 43–77)
Platelets: 350 10*3/uL (ref 150–400)
RBC: 4.27 MIL/uL (ref 4.22–5.81)

## 2012-09-16 LAB — BASIC METABOLIC PANEL
BUN: 8 mg/dL (ref 6–23)
Calcium: 9.8 mg/dL (ref 8.4–10.5)
GFR calc Af Amer: >90 mL/min (ref >90)
GFR calc non Af Amer: >90 mL/min (ref >90)
Glucose, Bld: 123 mg/dL — ABNORMAL HIGH (ref 70–99)
Potassium: 3.7 mEq/L (ref 3.5–5.1)

## 2012-09-16 MED ORDER — VANCOMYCIN HCL IN DEXTROSE 1-5 GM/200ML-% IV SOLN
1000.0000 mg | Freq: Once | INTRAVENOUS | Status: AC
  Administered 2012-09-16: 1000 mg via INTRAVENOUS
  Filled 2012-09-16: qty 200

## 2012-09-16 NOTE — ED Notes (Signed)
Lt leg swelling , pt says dx with gout. And has been taking his meds.  Alert.

## 2012-09-16 NOTE — ED Provider Notes (Signed)
History     CSN: [id number]  Arrival date & time 09/16/12  1811   First MD Initiated Contact with Patient 09/16/12 2012      Chief Complaint  Patient presents with  . Leg Swelling    (Consider location/radiation/quality/duration/timing/severity/associated sxs/prior treatment) Patient is a 27 y.o. male presenting with leg pain. The history is provided by the patient (the pt has pain in left calf).  Leg Pain Location:  Leg Injury: no   Leg location:  L leg Pain details:    Quality:  Aching   Radiates to:  Does not radiate   Severity:  Moderate   Onset quality:  Gradual Associated symptoms: no back pain and no fatigue     Past Medical History  Diagnosis Date  . Gout   . Cellulitis   . Obesity   . Erosive esophagitis 09/01/2012    NSAID induced.  . Gastric ulcer with hemorrhage 09/02/2012    s/p bleeding control tx. Per Dr. Jena Gauss  . Acute blood loss anemia 09/02/2012    S/p 1 unit rbcs    Past Surgical History  Procedure Laterality Date  . Tonsillectomy    . Esophagogastroduodenoscopy Left 09/01/2012    Procedure: ESOPHAGOGASTRODUODENOSCOPY (EGD);  Surgeon: Corbin Ade, MD;  Location: [facility];  Service: Endoscopy;  Laterality: Left;    Family History  Problem Relation Age of Onset  . Heart failure Mother     History  Substance Use Topics  . Smoking status: Former Smoker -- 0.50 packs/day for 7 years    Types: Cigarettes  . Smokeless tobacco: Former Neurosurgeon    Types: Snuff  . Alcohol Use: Yes     Comment: very occasional      Review of Systems  Constitutional: Negative for appetite change and fatigue.  HENT: Negative for congestion, sinus pressure and ear discharge.   Eyes: Negative for discharge.  Respiratory: Negative for cough.   Cardiovascular: Negative for chest pain.  Gastrointestinal: Negative for abdominal pain and diarrhea.  Genitourinary: Negative for frequency and hematuria.  Musculoskeletal: Negative for back pain.       Pain left  lower leg   Skin: Positive for rash.  Neurological: Negative for seizures and headaches.  Psychiatric/Behavioral: Negative for hallucinations.    Allergies  Asa; Penicillins; and Sulfa antibiotics  Home Medications   Current Outpatient Rx  Name  Route  Sig  Dispense  Refill  . colchicine 0.6 MG tablet   Oral   Take 1 tablet (0.6 mg total) by mouth 2 (two) times daily.   60 tablet   3   . diclofenac sodium (VOLTAREN) 1 % GEL   Topical   Apply 2 g topically 3 (three) times daily.   100 g   3   . HYDROcodone-acetaminophen (NORCO/VICODIN) 5-325 MG per tablet   Oral   Take 1-2 tablets by mouth every 6 (six) hours as needed for pain.   20 tablet   0   . pantoprazole (PROTONIX) 40 MG tablet   Oral   Take 1 tablet (40 mg total) by mouth 2 (two) times daily before a meal.   60 tablet   6     BP 141/80  Pulse 109  Temp(Src) 98.3 F (36.8 C) (Oral)  Resp 18  Ht 5\' 7"  (1.702 m)  Wt 401 lb (181.892 kg)  BMI 62.79 kg/m2  SpO2 99%  Physical Exam  Constitutional: He is oriented to person, place, and time. He appears well-developed.  HENT:  Head:  Normocephalic.  Eyes: Conjunctivae and EOM are normal. No scleral icterus.  Neck: Neck supple. No thyromegaly present.  Cardiovascular: Normal rate and regular rhythm.  Exam reveals no gallop and no friction rub.   No murmur heard. Pulmonary/Chest: No stridor. He has no wheezes. He has no rales. He exhibits no tenderness.  Abdominal: He exhibits no distension. There is no tenderness. There is no rebound.  Musculoskeletal: Normal range of motion. He exhibits no edema.  Tender swollen left calf with mild rash  Lymphadenopathy:    He has no cervical adenopathy.  Neurological: He is oriented to person, place, and time. Coordination normal.  Skin: No rash noted. No erythema.  Psychiatric: He has a normal mood and affect. His behavior is normal.    ED Course  Procedures (including critical care time)  Labs Reviewed  CBC WITH  DIFFERENTIAL - Abnormal; Notable for the following:    WBC 17.5 (*)    Hemoglobin 12.2 (*)    HCT 36.0 (*)    Neutro Abs 13.0 (*)    Monocytes Absolute 2.1 (*)    All other components within normal limits  BASIC METABOLIC PANEL - Abnormal; Notable for the following:    Sodium 134 (*)    Chloride 92 (*)    Glucose, Bld 123 (*)    All other components within normal limits   No results found.   1. Cellulitis       MDM          Benny Lennert, MD 09/16/12 2152

## 2012-09-17 ENCOUNTER — Encounter (HOSPITAL_COMMUNITY): Payer: Self-pay | Admitting: *Deleted

## 2012-09-17 DIAGNOSIS — L0390 Cellulitis, unspecified: Secondary | ICD-10-CM | POA: Diagnosis present

## 2012-09-17 DIAGNOSIS — B353 Tinea pedis: Secondary | ICD-10-CM | POA: Diagnosis present

## 2012-09-17 LAB — CBC
HCT: 34.9 % — ABNORMAL LOW (ref 39.0–52.0)
Hemoglobin: 11.6 g/dL — ABNORMAL LOW (ref 13.0–17.0)
MCHC: 33.2 g/dL (ref 30.0–36.0)
MCV: 84.3 fL (ref 78.0–100.0)
WBC: 12.8 10*3/uL — ABNORMAL HIGH (ref 4.0–10.5)

## 2012-09-17 LAB — COMPREHENSIVE METABOLIC PANEL
Alkaline Phosphatase: 60 U/L (ref 39–117)
BUN: 7 mg/dL (ref 6–23)
Chloride: 95 mEq/L — ABNORMAL LOW (ref 96–112)
Creatinine, Ser: 0.65 mg/dL (ref 0.50–1.35)
GFR calc Af Amer: >90 mL/min (ref >90)
Glucose, Bld: 148 mg/dL — ABNORMAL HIGH (ref 70–99)
Potassium: 4 mEq/L (ref 3.5–5.1)
Total Bilirubin: 0.4 mg/dL (ref 0.3–1.2)

## 2012-09-17 LAB — MAGNESIUM: Magnesium: 1.9 mg/dL (ref 1.5–2.5)

## 2012-09-17 MED ORDER — ACETAMINOPHEN 500 MG PO TABS: 1000.0000 mg | ORAL_TABLET | Freq: Four times a day (QID) | ORAL | Status: DC | PRN

## 2012-09-17 MED ORDER — COLCHICINE 0.6 MG PO TABS
0.6000 mg | ORAL_TABLET | ORAL | Status: AC
  Administered 2012-09-17 (×4): 0.6 mg via ORAL
  Filled 2012-09-17: qty 1
  Filled 2012-09-17: qty 3

## 2012-09-17 MED ORDER — CLOTRIMAZOLE 1 % EX CREA
TOPICAL_CREAM | CUTANEOUS | Status: AC
  Filled 2012-09-17: qty 15

## 2012-09-17 MED ORDER — COLCHICINE 0.6 MG PO TABS
0.6000 mg | ORAL_TABLET | Freq: Three times a day (TID) | ORAL | Status: DC
  Administered 2012-09-17 – 2012-09-18 (×3): 0.6 mg via ORAL
  Filled 2012-09-17 (×3): qty 1

## 2012-09-17 MED ORDER — PREDNISONE 20 MG PO TABS: 30.0000 mg | ORAL_TABLET | Freq: Every day | ORAL | Status: DC

## 2012-09-17 MED ORDER — DOCUSATE SODIUM 100 MG PO CAPS
100.0000 mg | ORAL_CAPSULE | Freq: Two times a day (BID) | ORAL | Status: DC
  Administered 2012-09-17 (×3): 100 mg via ORAL
  Filled 2012-09-17 (×3): qty 1

## 2012-09-17 MED ORDER — BISACODYL 10 MG RE SUPP: 10.0000 mg | Freq: Every day | RECTAL | Status: DC | PRN

## 2012-09-17 MED ORDER — VANCOMYCIN HCL 10 G IV SOLR
1500.0000 mg | Freq: Once | INTRAVENOUS | Status: AC
  Administered 2012-09-17: 1500 mg via INTRAVENOUS
  Filled 2012-09-17: qty 1500

## 2012-09-17 MED ORDER — TRAZODONE HCL 50 MG PO TABS
50.0000 mg | ORAL_TABLET | Freq: Every evening | ORAL | Status: DC | PRN
  Administered 2012-09-17: 50 mg via ORAL
  Filled 2012-09-17: qty 1

## 2012-09-17 MED ORDER — PANTOPRAZOLE SODIUM 40 MG PO TBEC
80.0000 mg | DELAYED_RELEASE_TABLET | Freq: Two times a day (BID) | ORAL | Status: DC
  Administered 2012-09-17 – 2012-09-18 (×3): 80 mg via ORAL
  Filled 2012-09-17: qty 2
  Filled 2012-09-17 (×2): qty 1
  Filled 2012-09-17: qty 2

## 2012-09-17 MED ORDER — PREDNISONE 20 MG PO TABS
50.0000 mg | ORAL_TABLET | Freq: Once | ORAL | Status: AC
  Administered 2012-09-17: 50 mg via ORAL
  Filled 2012-09-17: qty 2

## 2012-09-17 MED ORDER — PREDNISONE 10 MG PO TABS: 10.0000 mg | ORAL_TABLET | Freq: Every day | ORAL | Status: DC

## 2012-09-17 MED ORDER — VANCOMYCIN HCL IN DEXTROSE 1-5 GM/200ML-% IV SOLN
1000.0000 mg | Freq: Three times a day (TID) | INTRAVENOUS | Status: DC
  Administered 2012-09-17: 1000 mg via INTRAVENOUS
  Filled 2012-09-17 (×4): qty 200

## 2012-09-17 MED ORDER — PANTOPRAZOLE SODIUM 40 MG IV SOLR
40.0000 mg | Freq: Once | INTRAVENOUS | Status: AC
  Administered 2012-09-17: 40 mg via INTRAVENOUS
  Filled 2012-09-17: qty 40

## 2012-09-17 MED ORDER — HYDROMORPHONE HCL 2 MG PO TABS
1.0000 mg | ORAL_TABLET | ORAL | Status: DC | PRN
  Administered 2012-09-17 – 2012-09-18 (×4): 1 mg via ORAL
  Filled 2012-09-17 (×4): qty 1

## 2012-09-17 MED ORDER — POLYETHYLENE GLYCOL 3350 17 G PO PACK: 17.0000 g | PACK | Freq: Every day | ORAL | Status: DC | PRN

## 2012-09-17 MED ORDER — OXYCODONE HCL 5 MG PO TABS
5.0000 mg | ORAL_TABLET | ORAL | Status: DC | PRN
  Administered 2012-09-17 – 2012-09-18 (×4): 5 mg via ORAL
  Filled 2012-09-17 (×4): qty 1

## 2012-09-17 MED ORDER — METHYLPREDNISOLONE SODIUM SUCC 40 MG IJ SOLR
40.0000 mg | Freq: Four times a day (QID) | INTRAMUSCULAR | Status: DC
  Administered 2012-09-17: 40 mg via INTRAVENOUS
  Filled 2012-09-17: qty 1

## 2012-09-17 MED ORDER — CLOTRIMAZOLE 1 % EX CREA
TOPICAL_CREAM | Freq: Two times a day (BID) | CUTANEOUS | Status: DC
  Administered 2012-09-17 (×2): via TOPICAL
  Administered 2012-09-17: 1 via TOPICAL
  Administered 2012-09-18: 10:00:00 via TOPICAL
  Filled 2012-09-17: qty 15

## 2012-09-17 MED ORDER — COLCHICINE 0.6 MG PO TABS: 0.6000 mg | ORAL_TABLET | Freq: Two times a day (BID) | ORAL | Status: DC

## 2012-09-17 MED ORDER — CLINDAMYCIN HCL 150 MG PO CAPS
150.0000 mg | ORAL_CAPSULE | Freq: Three times a day (TID) | ORAL | Status: DC
  Administered 2012-09-17 – 2012-09-18 (×3): 150 mg via ORAL
  Filled 2012-09-17 (×3): qty 1

## 2012-09-17 MED ORDER — FLEET ENEMA 7-19 GM/118ML RE ENEM: 1.0000 | ENEMA | Freq: Once | RECTAL | Status: AC | PRN

## 2012-09-17 MED ORDER — ENOXAPARIN SODIUM 40 MG/0.4ML ~~LOC~~ SOLN
40.0000 mg | SUBCUTANEOUS | Status: DC
  Filled 2012-09-17: qty 0.4

## 2012-09-17 MED ORDER — PREDNISONE 20 MG PO TABS
40.0000 mg | ORAL_TABLET | Freq: Every day | ORAL | Status: DC
  Administered 2012-09-18: 40 mg via ORAL
  Filled 2012-09-17: qty 2

## 2012-09-17 NOTE — Care Management Note (Signed)
    Page 1 of 1   09/18/2012     2:50:59 PM   CARE MANAGEMENT NOTE 09/18/2012  Patient:  [name], [name]   Account Number:  [id number]  Date Initiated:  09/17/2012  Documentation initiated by:  Rosemary Holms  Subjective/Objective Assessment:   Pt admitted from his friends home. No longer welcome there. Pt states he will DC to his mother's home as long as she is still there. Rent issues.     Action/Plan:   Anticipated DC Date:  09/18/2012   Anticipated DC Plan:  HOME/SELF CARE      DC Planning Services  CM consult      Choice offered to / List presented to:             Status of service:  Completed, signed off Medicare Important Message given?   (If response is "NO", the following Medicare IM given date fields will be blank) Date Medicare IM given:   Date Additional Medicare IM given:    Discharge Disposition:  HOME/SELF CARE  Per UR Regulation:    If discussed at Long Length of Stay Meetings, dates discussed:    Comments:  09/17/12 Rosemary Holms RN BSN CM 09/18/12 Joson Sapp Leanord Hawking RN BSN CM Discussed medication cost with MD. Pt assisted after recent admission and not eligible for [facility]. Rx for DC are $4 meds both AB and Prednisone. Pt to dc today to his mothers. Pt states he still has no money for meds.

## 2012-09-17 NOTE — H&P (Signed)
[facility] History and Physical  [name]  BJY:[id number]  DOB: [birth year]   DOA: 09/16/2012   PCP:   No primary provider on file.   Chief Complaint:  Pain, left leg for one week  HPI: [name] is an 27 y.o. male.   Morbidly obese Caucasian gentleman, with a history of gout, discharge from this service about a week ago, after treatment for NSAID-induced GI bleed. He had gouty flare while in hospital and was treated with increasing doses of colchicine. Since discharge. He has had progressively worsening pain in his left foot and left knee. Despite continuing colchicine. He was seen in the emergency room 3 days ago for progressive swelling of the cough and had a negative lower extremity Doppler. He returns today because of progressive pain, left calf is not swollen and tender, in addition to the swelling, redness and tenderness of the left foot. He has had no fever.  He reports he is actively looking to lose weight and has already lost 50 pounds over an unspecified period.   Rewiew of Systems:   All systems negative except as marked bold or noted in the HPI;  Constitutional:    malaise, fever and chills. ;  Eyes:   eye pain, redness and discharge. ;  ENMT:   ear pain, hoarseness, nasal congestion, sinus pressure and sore throat. ;  Cardiovascular:    chest pain, palpitations, diaphoresis, dyspnea and peripheral edema.  Respiratory:   cough, hemoptysis, wheezing and stridor. ;  Gastrointestinal:  nausea, vomiting, diarrhea, constipation, abdominal pain, melena, blood in stool, hematemesis, jaundice and rectal bleeding. unusual weight loss..   Genitourinary:    frequency, dysuria, incontinence,flank pain and hematuria;  Skin: .  pruritus, chronic itchy rash, soles of both feet, and over left knee, abrasions, bruising and skin lesion.; ulcerations Neuro:    headache, lightheadedness and neck stiffness.  weakness, altered level of consciousness, altered mental status,  extremity weakness, burning feet, involuntary movement, seizure and syncope.  Psych:    anxiety, depression, insomnia, tearfulness, panic attacks, hallucinations, paranoia, suicidal or homicidal ideation    Past Medical History  Diagnosis Date  . Gout   . Cellulitis   . Obesity   . Erosive esophagitis 09/01/2012    NSAID induced.  . Gastric ulcer with hemorrhage 09/02/2012    s/p bleeding control tx. Per Dr. Jena Gauss  . Acute blood loss anemia 09/02/2012    S/p 1 unit rbcs    Past Surgical History  Procedure Laterality Date  . Tonsillectomy    . Esophagogastroduodenoscopy Left 09/01/2012    Procedure: ESOPHAGOGASTRODUODENOSCOPY (EGD);  Surgeon: Corbin Ade, MD;  Location: [facility];  Service: Endoscopy;  Laterality: Left;    Medications:  HOME MEDS: Prior to Admission medications   Medication Sig Start Date End Date Taking? Authorizing Provider  colchicine 0.6 MG tablet Take 1 tablet (0.6 mg total) by mouth 2 (two) times daily. 09/06/12  Yes Elliot Cousin, MD  diclofenac sodium (VOLTAREN) 1 % GEL Apply 2 g topically 3 (three) times daily. 09/06/12  Yes Elliot Cousin, MD  HYDROcodone-acetaminophen (NORCO/VICODIN) 5-325 MG per tablet Take 1-2 tablets by mouth every 6 (six) hours as needed for pain. 09/13/12  Yes Shelda Jakes, MD  pantoprazole (PROTONIX) 40 MG tablet Take 1 tablet (40 mg total) by mouth 2 (two) times daily before a meal. 09/06/12  Yes Elliot Cousin, MD     Allergies:  Allergies  Allergen Reactions  . Asa (Aspirin)  Pt doesn't know reaction.  Marland Kitchen Penicillins     Pt doesn't know reaction.  . Sulfa Antibiotics     Pt doesn't know reaction.    Social History:   reports that he has quit smoking. His smoking use included Cigarettes. He has a 3.5 pack-year smoking history. He has quit using smokeless tobacco. His smokeless tobacco use included Snuff. He reports that  drinks alcohol. He reports that he does not use illicit drugs.  Family History: Family  History  Problem Relation Age of Onset  . Heart failure Mother      Physical Exam: Filed Vitals:   09/16/12 2127 09/16/12 2159 09/16/12 2302 09/16/12 2347  BP: 141/80  128/81 140/85  Pulse: 109  111 118  Temp:  98.7 F (37.1 C)  98.6 F (37 C)  TempSrc:  Oral  Oral  Resp: 18  18 20   Height:    5\' 7"  (1.702 m)  Weight:    181.6 kg (400 lb 5.7 oz)  SpO2: 99%  96% 96%   Blood pressure 140/85, pulse 118, temperature 98.6 F (37 C), temperature source Oral, resp. rate 20, height 5\' 7"  (1.702 m), weight 181.6 kg (400 lb 5.7 oz), SpO2 96.00%.  GEN:  Pleasant , morbidly obese young Caucasian gentleman lying bed in significant painful distress; cooperative with exam PSYCH:  alert and oriented x4;  anxious; affect is appropriate. HEENT: Mucous membranes pink and anicteric; PERRLA; EOM intact; no cervical lymphadenopathy nor thyromegaly  Thick neck; Breasts:: Not examined CHEST WALL: No tenderness CHEST: Normal respiration, clear to auscultation bilaterally HEART: Tachycardia regular rhythm; no murmurs rubs or gallops BACK: No kyphosis no scoliosis; no CVA tenderness ABDOMEN: Obese, soft non-tender; no masses, no organomegaly, normal abdominal bowel sounds; large pannus; no intertriginous candida. Rectal Exam: Not done EXTREMITIES: Left lower extremity swelling from foot to knee. Left foot is red and diffusely swollen and tender; posterior, is swollen, tense, erythematous, and exquisitely tender. Genitalia: not examined PULSES: 2+ and symmetric SKIN: Papular, scaling rash over the front of left knee, ringlike pattern; scaly rash, cells, and insteps of both feet, ringworm-like CNS: Cranial nerves 2-12 grossly intact no focal lateralizing neurologic deficit   Labs on Admission:  Basic Metabolic Panel:  Recent Labs Lab 09/16/12 2027  NA 134*  K 3.7  CL 92*  CO2 30  GLUCOSE 123*  BUN 8  CREATININE 0.78  CALCIUM 9.8   Liver Function Tests: No results found for this basename:  AST, ALT, ALKPHOS, BILITOT, PROT, ALBUMIN,  in the last 168 hours No results found for this basename: LIPASE, AMYLASE,  in the last 168 hours No results found for this basename: AMMONIA,  in the last 168 hours CBC:  Recent Labs Lab 09/16/12 2027  WBC 17.5*  NEUTROABS 13.0*  HGB 12.2*  HCT 36.0*  MCV 84.3  PLT 350   Cardiac Enzymes: No results found for this basename: CKTOTAL, CKMB, CKMBINDEX, TROPONINI,  in the last 168 hours BNP: No components found with this basename: POCBNP,  D-dimer: No components found with this basename: D-DIMER,  CBG: No results found for this basename: GLUCAP,  in the last 168 hours  Radiological Exams on Admission: No results found.     Assessment/Plan   Active Problems:   Acute gouty arthritis  Management of this challenging because of his obesity and peptic ulcer disease. He is been receiving colchicine without good effect.   Will try accelerated doses of colchicine and then taper back to baseline. Over 2 days,  Will double the dose of his PPI, and give a 5 day taper of prednisone.  Consider rheumatology or orthopedic consult  Check serum uric acid   Cellulitis, left calf  Vancomycin Peptic ulcer disease  High-dose PPI while on steroids   Morbid obesity  A diabetic diet; advise patient to continue his current weight reduction regimen, which seems to have been working for him; we'll need to ensure adequate control of his gout as he loses weight; will need outpatient followup to ensure he is successfully transitioned to allopurinol   Tinea pedis  Clotrimazole cream    Other plans as per orders.  Code Status: DNR Family Communication: Plans discussed with patient Disposition Plan: Depending on hospital course    Keldan Eplin Nocturnist [facility] Pager [phone]   09/17/2012, 1:09 AM

## 2012-09-17 NOTE — Consult Note (Signed)
ANTIBIOTIC CONSULT NOTE-Preliminary  Pharmacy Consult for Vancomycin Indication: Cellulitis  Allergies  Allergen Reactions  . Asa (Aspirin)     Pt doesn't know reaction.  Marland Kitchen Penicillins     Pt doesn't know reaction.  . Sulfa Antibiotics     Pt doesn't know reaction.    Patient Measurements: Height: 5\' 7"  (170.2 cm) Weight: 400 lb 5.7 oz (181.6 kg) IBW/kg (Calculated) : 66.1   Vital Signs: Temp: 98.6 F (37 C) (06/02 2347) Temp src: Oral (06/02 2347) BP: 140/85 mmHg (06/02 2347) Pulse Rate: 118 (06/02 2347)  Labs:  Recent Labs  09/16/12 2027  WBC 17.5*  HGB 12.2*  PLT 350  CREATININE 0.78    Estimated Creatinine Clearance: 222.3 ml/min (by C-G formula based on Cr of 0.78).  No results found for this basename: VANCOTROUGH, Leodis Binet, VANCORANDOM, GENTTROUGH, GENTPEAK, GENTRANDOM, TOBRATROUGH, TOBRAPEAK, TOBRARND, AMIKACINPEAK, AMIKACINTROU, AMIKACIN,  in the last 72 hours   Microbiology: Recent Results (from the past 720 hour(s))  MRSA PCR SCREENING     Status: None   Collection Time    09/01/12  1:41 PM      Result Value Range Status   MRSA by PCR NEGATIVE  NEGATIVE Final   Comment:            The GeneXpert MRSA Assay (FDA     approved for NASAL specimens     only), is one component of a     comprehensive MRSA colonization     surveillance program. It is not     intended to diagnose MRSA     infection nor to guide or     monitor treatment for     MRSA infections.    Medical History: Past Medical History  Diagnosis Date  . Gout   . Cellulitis   . Obesity   . Erosive esophagitis 09/01/2012    NSAID induced.  . Gastric ulcer with hemorrhage 09/02/2012    s/p bleeding control tx. Per Dr. Jena Gauss  . Acute blood loss anemia 09/02/2012    S/p 1 unit rbcs    Medications:  Vancomycin 1 Gm IV in the ED  Assessment: 27 yo morbidly obese male with elevated WBCs and left lower extremity pain with a rash who will be given vancomycin empirically for  cellulitis.    Goal of Therapy:  Vancomycin troughs 10-15 mcg/ml  Plan:  Preliminary review of pertinent patient information completed.  Protocol will be initiated with a one-time dose of Vancomycin 1500 mg following the initial 1 gm dose given in the ED, for a total dose of 2500 mg.  Jeani Hawking clinical pharmacist will complete review during morning rounds to assess patient and finalize treatment regimen.  Arelia Sneddon, [facility] 09/17/2012,3:34 AM

## 2012-09-17 NOTE — Plan of Care (Signed)
Problem: Phase I Progression Outcomes Goal: OOB as tolerated unless otherwise ordered Outcome: Completed/Met Date Met:  09/17/12 Patient up to [facility] when needed

## 2012-09-17 NOTE — Progress Notes (Signed)
[facility] PROGRESS NOTE  [name] ZOX:[id number] DOB: 12/11/1985 DOA: 09/16/2012 PCP: No primary provider on file.  Clinical Summary: 27 year old man presented to the emergency department with worsening left lower extremity pain. He was admitted for treatment of gout and lower extremity cellulitis. Recently admitted 08/2012 for bleeding gastric ulcer secondary to NSAID use.  Assessment/Plan: 1. Left lower extremity cellulitis: Appears much improved. Change to oral antibiotics. 2. Acute gouty arthritis: Continues to be a difficult issue to manage because of gastric ulcer disease. Agree with trial of prednisone on double dose PPI and continue colchicine given significant functional limitation and failure of more conservative measures. If no response will consider orthopedic consultation. Once this flare has resolved would suggest starting allopurinol. 3. Gastric ulcer disease: No recurrence of bleeding. Continue PPI. 4. Morbid obesity: Continue to lose weight as able.   Change to oral antibiotics  Add SCDs  Code Status: Full code DVT prophylaxis: Refusing Lovenox. SCDs. Family Communication: None present Disposition Plan: Likely home 6/4.  Brendia Sacks, MD  [facility]  Pager [phone] If 7PM-7AM, please contact night-coverage at [email or web address], password [facility] 09/17/2012, 12:22 PM  LOS: 1 day   Consultants:    Procedures:    Antibiotics:  IV vancomycin 6/2 >> 6/3  Clindamycin 6/3 >>  HPI/Subjective: Continues to have severe pain in his knee from gout.  Objective: Filed Vitals:   09/16/12 2302 09/16/12 2347 09/17/12 0419 09/17/12 1148  BP: 128/81 140/85 131/81   Pulse: 111 118 102 78  Temp:  98.6 F (37 C) 98.1 F (36.7 C)   TempSrc:  Oral Oral   Resp: 18 20 19    Height:  5\' 7"  (1.702 m)    Weight:  181.6 kg (400 lb 5.7 oz) 181.4 kg (399 lb 14.6 oz)   SpO2: 96% 96% 95% 95%    Intake/Output Summary (Last 24 hours) at 09/17/12 1222 Last  data filed at 09/17/12 0521  Gross per 24 hour  Intake   1420 ml  Output    950 ml  Net    470 ml     Filed Weights   09/16/12 1824 09/16/12 2347 09/17/12 0419  Weight: 181.892 kg (401 lb) 181.6 kg (400 lb 5.7 oz) 181.4 kg (399 lb 14.6 oz)    Exam:  General:  Appears calm and comfortable. Morbidly obese. Cardiovascular: RRR, no m/r/g. No LE edema. Respiratory: CTA bilaterally, no w/r/r. Normal respiratory effort. Skin: I not appreciate any erythema or evidence of infection of the left lower extremity Musculoskeletal: grossly normal tone BUE/BLE, left lower extremity range of motion limited by pain especially at the knee. The left knee appears grossly unremarkable without evidence of infection. Some candidiasis behind the knee is seen. Psychiatric: grossly normal mood and affect, speech fluent and appropriate  Data Reviewed:  Complete metabolic panel unremarkable. Leukocytosis decreased to 12.8. Hemoglobin stable 11.6. Recent left lower extremity Doppler 5/30 was negative.  Pending studies:  None  Scheduled Meds: . clotrimazole   Topical BID  . colchicine  0.6 mg Oral TID   Followed by  . [START ON 09/19/2012] colchicine  0.6 mg Oral BID  . docusate sodium  100 mg Oral BID  . enoxaparin (LOVENOX) injection  40 mg Subcutaneous Q24H  . pantoprazole  80 mg Oral BID AC  . [START ON 09/18/2012] predniSONE  40 mg Oral Daily   Followed by  . [START ON 09/20/2012] predniSONE  30 mg Oral Daily   Followed by  . [START ON 09/22/2012] predniSONE  10 mg Oral Daily  . vancomycin  1,000 mg Intravenous Q8H   Continuous Infusions:   Active Problems:   Acute gouty arthritis   Morbid obesity   Cellulitis   Tinea pedis   Time spent 15 minutes

## 2012-09-17 NOTE — Progress Notes (Signed)
ANTIBIOTIC CONSULT NOTE - FOLLOW UP  Pharmacy Consult for Vancomycin Indication: cellulitis  Allergies  Allergen Reactions  . Asa (Aspirin)     Pt doesn't know reaction.  Marland Kitchen Penicillins     Pt doesn't know reaction.  . Sulfa Antibiotics     Pt doesn't know reaction.   Patient Measurements: Height: 5\' 7"  (170.2 cm) Weight: 399 lb 14.6 oz (181.4 kg) IBW/kg (Calculated) : 66.1  Vital Signs: Temp: 98.1 F (36.7 C) (06/03 0419) Temp src: Oral (06/03 0419) BP: 131/81 mmHg (06/03 0419) Pulse Rate: 102 (06/03 0419) Intake/Output from previous day: 06/02 0701 - 06/03 0700 In: 1420 [P.O.:720; IV Piggyback:700] Out: 950 [Urine:950] Intake/Output from this shift:    Labs:  Recent Labs  09/16/12 2027 09/17/12 0506  WBC 17.5* 12.8*  HGB 12.2* 11.6*  PLT 350 346  CREATININE 0.78 0.65   Estimated Creatinine Clearance: 222.1 ml/min (by C-G formula based on Cr of 0.65). No results found for this basename: VANCOTROUGH, Leodis Binet, VANCORANDOM, GENTTROUGH, GENTPEAK, GENTRANDOM, TOBRATROUGH, TOBRAPEAK, TOBRARND, AMIKACINPEAK, AMIKACINTROU, AMIKACIN,  in the last 72 hours   Microbiology: Recent Results (from the past 720 hour(s))  MRSA PCR SCREENING     Status: None   Collection Time    09/01/12  1:41 PM      Result Value Range Status   MRSA by PCR NEGATIVE  NEGATIVE Final   Comment:            The GeneXpert MRSA Assay (FDA     approved for NASAL specimens     only), is one component of a     comprehensive MRSA colonization     surveillance program. It is not     intended to diagnose MRSA     infection nor to guide or     monitor treatment for     MRSA infections.    Anti-infectives   Start     Dose/Rate Route Frequency Ordered Stop   09/17/12 1200  vancomycin (VANCOCIN) IVPB 1000 mg/200 mL premix     1,000 mg 200 mL/hr over 60 Minutes Intravenous Every 8 hours 09/17/12 0753     09/17/12 0330  vancomycin (VANCOCIN) 1,500 mg in sodium chloride 0.9 % 500 mL IVPB     1,500  mg 250 mL/hr over 120 Minutes Intravenous  Once 09/17/12 0326 09/17/12 0617   09/16/12 2200  vancomycin (VANCOCIN) IVPB 1000 mg/200 mL premix     1,000 mg 200 mL/hr over 60 Minutes Intravenous  Once 09/16/12 2145 09/16/12 2327     Assessment: 26yo morbidly obese male c/o redness and tenderness of the left foot.  Estimated Creatinine Clearance: 222.1 ml/min (by C-G formula based on Cr of 0.65).  Goal of Therapy:  Vancomycin trough level 10-15 mcg/ml  Plan:  Vancomycin 1gm IV q8hrs Check trough at steady state Monitor labs, renal fxn, and cultures Duration of therapy per MD  Valrie Hart A 09/17/2012,7:53 AM

## 2012-09-17 NOTE — Clinical Social Work Note (Signed)
CSW received referral for assistance with medications at d/c. CSW will notify CM. CSW signing off but can be reconsulted if needed.  Derenda Fennel, [place] [phone]

## 2012-09-18 DIAGNOSIS — B353 Tinea pedis: Secondary | ICD-10-CM

## 2012-09-18 MED ORDER — CLOTRIMAZOLE 1 % EX CREA: TOPICAL_CREAM | Freq: Two times a day (BID) | CUTANEOUS | Status: DC

## 2012-09-18 MED ORDER — CLINDAMYCIN HCL 150 MG PO CAPS: 150.0000 mg | ORAL_CAPSULE | Freq: Three times a day (TID) | ORAL | Status: DC

## 2012-09-18 MED ORDER — PREDNISONE 10 MG PO TABS: ORAL_TABLET | ORAL | Status: DC

## 2012-09-18 NOTE — Discharge Summary (Signed)
Physician Discharge Summary  [name] NGE:[id number] DOB: 1985/11/10 DOA: 09/16/2012  PCP: No primary provider on file.  Admit date: 09/16/2012 Discharge date: 09/18/2012  Time spent: 25 minutes  Recommendations for Outpatient Follow-up:  Patient is being discharged in the care of his mother. He is being discharged on 5 more days of clindamycin which in review of pharmacy, only be $3.  Discharge Diagnoses:  Active Problems:   Acute gouty arthritis   Morbid obesity   Cellulitis   Tinea pedis   Discharge Condition: Improved, being discharged home  Diet recommendation: Heart healthy  Filed Weights   09/16/12 2347 09/17/12 0419 09/18/12 0525  Weight: 181.6 kg (400 lb 5.7 oz) 181.4 kg (399 lb 14.6 oz) 191.2 kg (421 lb 8.3 oz)    History of present illness:  [name] is an 27 y.o. male. Morbidly obese Caucasian gentleman, with a history of gout, discharge from this service about a week ago, after treatment for NSAID-induced GI bleed. He had gouty flare while in hospital and was treated with increasing doses of colchicine. Since discharge. He has had progressively worsening pain in his left foot and left knee. Despite continuing colchicine. He was seen in the emergency room 3 days ago for progressive swelling of the cough and had a negative lower extremity Doppler. He returns today because of progressive pain, left calf is not swollen and tender, in addition to the swelling, redness and tenderness of the left foot. He has had no fever.   Hospital Course:  Active Problems:   Acute gouty arthritis: Patient was treated with a tapering dose of prednisone. He will continue on prednisone and once this flareup is complete, could consider allopurinol.   Morbid obesity: Patient working on weight loss.   Cellulitis: Patient was initially started on IV antibiotics and quickly changed over to clindamycin which he seems to be tolerating well.   Tinea  pedis   Procedures:  None  Consultations:  None  Discharge Exam: Filed Vitals:   09/17/12 1445 09/17/12 2215 09/18/12 0213 09/18/12 0525  BP: 128/62 132/68 128/74 136/70  Pulse: 64 86 73 80  Temp: 97.4 F (36.3 C) 97.2 F (36.2 C) 97.4 F (36.3 C) 98 F (36.7 C)  TempSrc:   Oral   Resp: 20 20 20 20   Height:      Weight:    191.2 kg (421 lb 8.3 oz)  SpO2: 95% 100% 98% 98%    General: Alert and oriented x3, Cardiovascular: Regular rate and rhythm, S1-S2 Respiratory: CTA Bilat Abdomen: Soft, nontender, obese, positive bowel sounds Extremities: No clubbing or cyanosis, no evidence of infection  Discharge Instructions  Discharge Orders   Future Appointments Provider Department Dept Phone   12/06/2012 10:30 AM De Bl[facility] [phone]   Future Orders Complete By Expires     Diet - low sodium heart healthy  As directed     Increase activity slowly  As directed         Medication List    TAKE these medications       clindamycin 150 MG capsule  Commonly known as:  CLEOCIN  Take 1 capsule (150 mg total) by mouth every 8 (eight) hours.     clotrimazole 1 % cream  Commonly known as:  LOTRIMIN  Apply topically 2 (two) times daily.     colchicine 0.6 MG tablet  Take 1 tablet (0.6 mg total) by mouth 2 (two) times daily.     diclofenac  sodium 1 % Gel  Commonly known as:  VOLTAREN  Apply 2 g topically 3 (three) times daily.     HYDROcodone-acetaminophen 5-325 MG per tablet  Commonly known as:  NORCO/VICODIN  Take 1-2 tablets by mouth every 6 (six) hours as needed for pain.     pantoprazole 40 MG tablet  Commonly known as:  PROTONIX  Take 1 tablet (40 mg total) by mouth 2 (two) times daily before a meal.     predniSONE 10 MG tablet  Commonly known as:  DELTASONE  40mg  po daily x 2 days, 20mg  po daily x 2 days, 10mg  po daily x 2 days       Allergies  Allergen Reactions  . Asa (Aspirin)     Pt doesn't know  reaction.  Marland Kitchen Penicillins     Pt doesn't know reaction.  . Sulfa Antibiotics     Pt doesn't know reaction.      The results of significant diagnostics from this hospitalization (including imaging, microbiology, ancillary and laboratory) are listed below for reference.      Labs: Basic Metabolic Panel:  Recent Labs Lab 09/16/12 2027 09/17/12 0506  NA 134* 136  K 3.7 4.0  CL 92* 95*  CO2 30 30  GLUCOSE 123* 148*  BUN 8 7  CREATININE 0.78 0.65  CALCIUM 9.8 9.5  MG  --  1.9   Liver Function Tests:  Recent Labs Lab 09/17/12 0506  AST 21  ALT 19  ALKPHOS 60  BILITOT 0.4  PROT 7.8  ALBUMIN 2.9*   CBC:  Recent Labs Lab 09/16/12 2027 09/17/12 0506  WBC 17.5* 12.8*  NEUTROABS 13.0*  --   HGB 12.2* 11.6*  HCT 36.0* 34.9*  MCV 84.3 84.3  PLT 350 346    Signed:  Jeff Frieden K  [facility] 09/18/2012, 1:26 PM

## 2012-09-18 NOTE — Progress Notes (Signed)
Pt a/o.vss. Saline lock removed. Up with assistance. Discharge instructions given and discussed. Pt verbalized understanding of instructions. Awaiting for family to arrive for discharge.

## 2012-10-21 ENCOUNTER — Encounter (HOSPITAL_COMMUNITY): Payer: Self-pay | Admitting: Emergency Medicine

## 2012-10-21 ENCOUNTER — Inpatient Hospital Stay (HOSPITAL_COMMUNITY)
Admission: EM | Admit: 2012-10-21 | Discharge: 2012-10-24 | DRG: 554 | Disposition: A | Discharge status: Home Health | Payer: Medicaid Other | Attending: Family Medicine | Admitting: Family Medicine

## 2012-10-21 DIAGNOSIS — M109 Gout, unspecified: Principal | ICD-10-CM | POA: Diagnosis present

## 2012-10-21 DIAGNOSIS — B353 Tinea pedis: Secondary | ICD-10-CM

## 2012-10-21 DIAGNOSIS — L0390 Cellulitis, unspecified: Secondary | ICD-10-CM

## 2012-10-21 DIAGNOSIS — Z87891 Personal history of nicotine dependence: Secondary | ICD-10-CM

## 2012-10-21 DIAGNOSIS — D62 Acute posthemorrhagic anemia: Secondary | ICD-10-CM

## 2012-10-21 DIAGNOSIS — Z8249 Family history of ischemic heart disease and other diseases of the circulatory system: Secondary | ICD-10-CM

## 2012-10-21 DIAGNOSIS — N390 Urinary tract infection, site not specified: Secondary | ICD-10-CM | POA: Diagnosis present

## 2012-10-21 DIAGNOSIS — K922 Gastrointestinal hemorrhage, unspecified: Secondary | ICD-10-CM

## 2012-10-21 DIAGNOSIS — D649 Anemia, unspecified: Secondary | ICD-10-CM

## 2012-10-21 DIAGNOSIS — K2210 Ulcer of esophagus without bleeding: Secondary | ICD-10-CM

## 2012-10-21 DIAGNOSIS — Z8711 Personal history of peptic ulcer disease: Secondary | ICD-10-CM

## 2012-10-21 DIAGNOSIS — Z6841 Body Mass Index (BMI) 40.0 and over, adult: Secondary | ICD-10-CM | POA: Diagnosis present

## 2012-10-21 DIAGNOSIS — K920 Hematemesis: Secondary | ICD-10-CM

## 2012-10-21 DIAGNOSIS — E876 Hypokalemia: Secondary | ICD-10-CM

## 2012-10-21 LAB — CBC WITH DIFFERENTIAL/PLATELET
Eosinophils Relative: 0 % (ref 0–5)
HCT: 38.7 % — ABNORMAL LOW (ref 39.0–52.0)
Lymphocytes Relative: 10 % — ABNORMAL LOW (ref 12–46)
Lymphs Abs: 1.5 10*3/uL (ref 0.7–4.0)
MCV: 82.9 fL (ref 78.0–100.0)
Monocytes Absolute: 1.4 10*3/uL — ABNORMAL HIGH (ref 0.1–1.0)
RBC: 4.67 MIL/uL (ref 4.22–5.81)
WBC: 14.2 10*3/uL — ABNORMAL HIGH (ref 4.0–10.5)

## 2012-10-21 LAB — BASIC METABOLIC PANEL
CO2: 29 mEq/L (ref 19–32)
Calcium: 9.7 mg/dL (ref 8.4–10.5)
Chloride: 97 mEq/L (ref 96–112)
Creatinine, Ser: 0.63 mg/dL (ref 0.50–1.35)
Glucose, Bld: 116 mg/dL — ABNORMAL HIGH (ref 70–99)
Sodium: 137 mEq/L (ref 135–145)

## 2012-10-21 LAB — URINALYSIS, ROUTINE W REFLEX MICROSCOPIC
Glucose, UA: NEGATIVE mg/dL
pH: 7 (ref 5.0–8.0)

## 2012-10-21 LAB — URINE MICROSCOPIC-ADD ON

## 2012-10-21 LAB — URIC ACID: Uric Acid, Serum: 10.5 mg/dL — ABNORMAL HIGH (ref 4.0–7.8)

## 2012-10-21 MED ORDER — ONDANSETRON HCL 4 MG/2ML IJ SOLN
4.0000 mg | Freq: Once | INTRAMUSCULAR | Status: AC
  Administered 2012-10-21: 4 mg via INTRAVENOUS
  Filled 2012-10-21: qty 2

## 2012-10-21 MED ORDER — HYDROCODONE-ACETAMINOPHEN 5-325 MG PO TABS
1.0000 | ORAL_TABLET | ORAL | Status: DC | PRN
  Administered 2012-10-21 – 2012-10-24 (×11): 2 via ORAL
  Filled 2012-10-21 (×11): qty 2

## 2012-10-21 MED ORDER — SODIUM CHLORIDE 0.9 % IV SOLN
INTRAVENOUS | Status: DC
  Administered 2012-10-21: 20:00:00 via INTRAVENOUS

## 2012-10-21 MED ORDER — SODIUM CHLORIDE 0.9 % IV SOLN: INTRAVENOUS | Status: AC

## 2012-10-21 MED ORDER — SODIUM CHLORIDE 0.9 % IV BOLUS (SEPSIS)
500.0000 mL | Freq: Once | INTRAVENOUS | Status: AC
  Administered 2012-10-21: 500 mL via INTRAVENOUS

## 2012-10-21 MED ORDER — SODIUM CHLORIDE 0.9 % IV SOLN
INTRAVENOUS | Status: DC
  Administered 2012-10-22 – 2012-10-23 (×5): via INTRAVENOUS

## 2012-10-21 MED ORDER — DEXTROSE 5 % IV SOLN
1.0000 g | INTRAVENOUS | Status: DC
  Administered 2012-10-22 – 2012-10-24 (×3): 1 g via INTRAVENOUS
  Filled 2012-10-21 (×4): qty 10

## 2012-10-21 MED ORDER — ONDANSETRON HCL 4 MG/2ML IJ SOLN: 4.0000 mg | Freq: Three times a day (TID) | INTRAMUSCULAR | Status: AC | PRN

## 2012-10-21 MED ORDER — METHYLPREDNISOLONE SODIUM SUCC 125 MG IJ SOLR
125.0000 mg | Freq: Once | INTRAMUSCULAR | Status: AC
  Administered 2012-10-21: 125 mg via INTRAVENOUS
  Filled 2012-10-21: qty 2

## 2012-10-21 MED ORDER — PANTOPRAZOLE SODIUM 40 MG PO TBEC
40.0000 mg | DELAYED_RELEASE_TABLET | Freq: Two times a day (BID) | ORAL | Status: DC
  Administered 2012-10-22 – 2012-10-24 (×5): 40 mg via ORAL
  Filled 2012-10-21 (×5): qty 1

## 2012-10-21 MED ORDER — COLCHICINE 0.6 MG PO TABS
0.6000 mg | ORAL_TABLET | Freq: Two times a day (BID) | ORAL | Status: DC
  Administered 2012-10-21 – 2012-10-24 (×6): 0.6 mg via ORAL
  Filled 2012-10-21 (×6): qty 1

## 2012-10-21 MED ORDER — ENOXAPARIN SODIUM 40 MG/0.4ML ~~LOC~~ SOLN: 40.0000 mg | SUBCUTANEOUS | Status: DC

## 2012-10-21 MED ORDER — DEXTROSE 5 % IV SOLN
1.0000 g | Freq: Once | INTRAVENOUS | Status: AC
  Administered 2012-10-21: 1 g via INTRAVENOUS
  Filled 2012-10-21: qty 10

## 2012-10-21 MED ORDER — HYDROMORPHONE HCL PF 1 MG/ML IJ SOLN
1.0000 mg | Freq: Once | INTRAMUSCULAR | Status: AC
  Administered 2012-10-21: 1 mg via INTRAVENOUS
  Filled 2012-10-21: qty 1

## 2012-10-21 NOTE — H&P (Signed)
PCP:   No primary provider on file.   Chief Complaint:  Leg pain  HPI: 34 old male with a history of gout him to the hospital because of worsening leg pain and swelling and left wrist and shoulder pain for last one week. Patient went out of his colchicine almost 2 weeks ago as patient cannot afford to buy any more. Patient unable to ablate due to pain in the legs. He denies fever no chest pain shortness of breath no nausea or diarrhea. Patient does have a history of gastric ulcer and takes Protonix.  Allergies:   Allergies  Allergen Reactions  . Asa (Aspirin) Other (See Comments)    Pt doesn't know reaction.  Marland Kitchen Penicillins Other (See Comments)    Pt doesn't know reaction.  . Sulfa Antibiotics Other (See Comments)    Pt doesn't know reaction.      Past Medical History  Diagnosis Date  . Gout   . Cellulitis   . Obesity   . Erosive esophagitis 09/01/2012    NSAID induced.  . Gastric ulcer with hemorrhage 09/02/2012    s/p bleeding control tx. Per Dr. Jena Gauss  . Acute blood loss anemia 09/02/2012    S/p 1 unit rbcs    Past Surgical History  Procedure Laterality Date  . Tonsillectomy    . Esophagogastroduodenoscopy Left 09/01/2012    Procedure: ESOPHAGOGASTRODUODENOSCOPY (EGD);  Surgeon: Corbin Ade, MD;  Location: [facility];  Service: Endoscopy;  Laterality: Left;    Prior to Admission medications   Medication Sig Start Date End Date Taking? Authorizing Provider  colchicine 0.6 MG tablet Take 1 tablet (0.6 mg total) by mouth 2 (two) times daily. 09/06/12  Yes Elliot Cousin, MD  pantoprazole (PROTONIX) 40 MG tablet Take 1 tablet (40 mg total) by mouth 2 (two) times daily before a meal. 09/06/12  Yes Elliot Cousin, MD    Social History:  reports that he has quit smoking. His smoking use included Cigarettes. He has a 3.5 pack-year smoking history. He has quit using smokeless tobacco. His smokeless tobacco use included Snuff. He reports that  drinks alcohol. He reports that he  does not use illicit drugs.  Family History  Problem Relation Age of Onset  . Heart failure Mother      All the positives are listed in BOLD  Review of Systems:  HEENT: Denies headache, blurred vision, runny nose, sore throat,  Neck: Denies thyroid problems,lymphadenopathy Chest : Denies shortness of breath, no history of COPD Heart : Denies Chest pain,  coronary arterey disease GI: See history of present illness GU: Denies dysuria, urgency, frequency of urination, hematuria Neuro: Denies stroke, seizures, syncope Psych: Denies depression, anxiety, hallucinations   Physical Exam: Blood pressure 129/62, pulse 110, temperature 99.5 F (37.5 C), temperature source Oral, resp. rate 20, height 5\' 7"  (1.702 m), weight 187.336 kg (413 lb), SpO2 95.00%. Constitutional:   Patient is a well-developed and well-nourished male in no acute distress and cooperative with exam. Head: Normocephalic and atraumatic Mouth: Mucus membranes moist Eyes: PERRL, EOMI, conjunctivae normal Neck: Supple, No Thyromegaly Cardiovascular: RRR, S1 normal, S2 normal Pulmonary/Chest: CTAB, no wheezes, rales, or rhonchi Abdominal: Soft. Non-tender, non-distended, bowel sounds are normal, no masses, organomegaly, or guarding present.  Neurological: A&O x3, Strenght is normal and symmetric bilaterally, cranial nerve II-XII are grossly intact, no focal motor deficit, sensory intact to light touch bilaterally.  Extremities : Left wrist swollen, positive warm to touch. Both ankles swollen, warm to touch. No erythema  noted.   Labs on Admission:  Results for orders placed during the hospital encounter of 10/21/12 (from the past 48 hour(s))  CBC WITH DIFFERENTIAL     Status: Abnormal   Collection Time    10/21/12  6:34 PM      Result Value Range   WBC 14.2 (*) 4.0 - 10.5 K/uL   RBC 4.67  4.22 - 5.81 MIL/uL   Hemoglobin 12.9 (*) 13.0 - 17.0 g/dL   HCT 45.4 (*) 09.8 - 11.9 %   MCV 82.9  78.0 - 100.0 fL   MCH 27.6   26.0 - 34.0 pg   MCHC 33.3  30.0 - 36.0 g/dL   RDW 14.7  82.9 - 56.2 %   Platelets 377  150 - 400 K/uL   Neutrophils Relative % 80 (*) 43 - 77 %   Neutro Abs 11.3 (*) 1.7 - 7.7 K/uL   Lymphocytes Relative 10 (*) 12 - 46 %   Lymphs Abs 1.5  0.7 - 4.0 K/uL   Monocytes Relative 10  3 - 12 %   Monocytes Absolute 1.4 (*) 0.1 - 1.0 K/uL   Eosinophils Relative 0  0 - 5 %   Eosinophils Absolute 0.0  0.0 - 0.7 K/uL   Basophils Relative 0  0 - 1 %   Basophils Absolute 0.0  0.0 - 0.1 K/uL  BASIC METABOLIC PANEL     Status: Abnormal   Collection Time    10/21/12  6:34 PM      Result Value Range   Sodium 137  135 - 145 mEq/L   Potassium 3.6  3.5 - 5.1 mEq/L   Chloride 97  96 - 112 mEq/L   CO2 29  19 - 32 mEq/L   Glucose, Bld 116 (*) 70 - 99 mg/dL   BUN 7  6 - 23 mg/dL   Creatinine, Ser 1.30  0.50 - 1.35 mg/dL   Calcium 9.7  8.4 - 86.5 mg/dL   GFR calc non Af Amer >90  >90 mL/min   GFR calc Af Amer >90  >90 mL/min   Comment:            The eGFR has been calculated     using the CKD EPI equation.     This calculation has not been     validated in all clinical     situations.     eGFR's persistently     <90 mL/min signify     possible Chronic Kidney Disease.  URIC ACID     Status: Abnormal   Collection Time    10/21/12  6:34 PM      Result Value Range   Uric Acid, Serum 10.5 (*) 4.0 - 7.8 mg/dL  URINALYSIS, ROUTINE W REFLEX MICROSCOPIC     Status: Abnormal   Collection Time    10/21/12  7:45 PM      Result Value Range   Color, Urine YELLOW  YELLOW   APPearance CLOUDY (*) CLEAR   Specific Gravity, Urine 1.020  1.005 - 1.030   pH 7.0  5.0 - 8.0   Glucose, UA NEGATIVE  NEGATIVE mg/dL   Hgb urine dipstick TRACE (*) NEGATIVE   Bilirubin Urine NEGATIVE  NEGATIVE   Ketones, ur TRACE (*) NEGATIVE mg/dL   Protein, ur TRACE (*) NEGATIVE mg/dL   Urobilinogen, UA 1.0  0.0 - 1.0 mg/dL   Nitrite POSITIVE (*) NEGATIVE   Leukocytes, UA LARGE (*) NEGATIVE  URINE MICROSCOPIC-ADD ON  Status: Abnormal   Collection Time    10/21/12  7:45 PM      Result Value Range   Squamous Epithelial / LPF RARE  RARE   WBC, UA TOO NUMEROUS TO COUNT  <3 WBC/hpf   RBC / HPF 0-2  <3 RBC/hpf   Bacteria, UA MANY (*) RARE    Radiological Exams on Admission: No results found.  Assessment/Plan Active Problems:   Acute gouty arthritis   Morbid obesity   Gastric ulcer with hemorrhage   UTI (lower urinary tract infection)  Acute gouty arthritis Patient has gout flareup, he has received one dose of Solu-Medrol in the ED Data recent history of gastric ulcer would avoid steroids Start colchicine 0.6 mg by mouth twice a day Vicodin when necessary for pain  UTI Patient's UA is abnormal and has received Rocephin in the ED Await urine culture results, and continue with Rocephin  History of gastric ulcer Continue Protonix 40 mg by mouth daily  DVT prophylaxis Lovenox  Code status: Full code, presumed  Family discussion: Discussed with patient's stepfather at bedside   Time Spent on Admission: 55 min  Joydan Gretzinger S [facility] Pager: [phone] 10/21/2012, 9:54 PM  If 7PM-7AM, please contact night-coverage  [email or web address]  Password [email or web address]

## 2012-10-21 NOTE — ED Provider Notes (Signed)
History    CSN: [id number] Arrival date & time 10/21/12  1640  First MD Initiated Contact with Patient 10/21/12 1753     Chief Complaint  Patient presents with  . Shoulder Pain  . Leg Pain   (Consider location/radiation/quality/duration/timing/severity/associated sxs/prior Treatment) HPI Comments: [name] is a 27 y.o. male who states that he has generalized pain in both legs, left greater than right, his left elbow, and his left wrist and hand. He ran out of his colchicine one week ago. He cannot afford to buy any more. He does not have a local doctor. He cannot ambulate secondary to pain in the legs. His family members, with whom he lives, are unable to help him get around. He feels like his left lower leg is swollen and might have cellulitis in it. He denies chest pain or shortness of breath. He has not had documented fever. He's had poor appetite for several days. There are no other known modifying factors.  The history is provided by the patient.   Past Medical History  Diagnosis Date  . Gout   . Cellulitis   . Obesity   . Erosive esophagitis 09/01/2012    NSAID induced.  . Gastric ulcer with hemorrhage 09/02/2012    s/p bleeding control tx. Per Dr. Jena Gauss  . Acute blood loss anemia 09/02/2012    S/p 1 unit rbcs   Past Surgical History  Procedure Laterality Date  . Tonsillectomy    . Esophagogastroduodenoscopy Left 09/01/2012    Procedure: ESOPHAGOGASTRODUODENOSCOPY (EGD);  Surgeon: Corbin Ade, MD;  Location: [facility];  Service: Endoscopy;  Laterality: Left;   Family History  Problem Relation Age of Onset  . Heart failure Mother    History  Substance Use Topics  . Smoking status: Former Smoker -- 0.50 packs/day for 7 years    Types: Cigarettes  . Smokeless tobacco: Former Neurosurgeon    Types: Snuff  . Alcohol Use: Yes     Comment: very occasional    Review of Systems  All other systems reviewed and are negative.    Allergies  Asa; Penicillins; and  Sulfa antibiotics  Home Medications   Current Outpatient Rx  Name  Route  Sig  Dispense  Refill  . colchicine 0.6 MG tablet   Oral   Take 1 tablet (0.6 mg total) by mouth 2 (two) times daily.   60 tablet   3   . pantoprazole (PROTONIX) 40 MG tablet   Oral   Take 1 tablet (40 mg total) by mouth 2 (two) times daily before a meal.   60 tablet   6    BP 129/62  Pulse 110  Temp(Src) 99.5 F (37.5 C) (Oral)  Resp 20  Ht 5\' 7"  (1.702 m)  Wt 413 lb (187.336 kg)  BMI 64.67 kg/m2  SpO2 95% Physical Exam  Nursing note and vitals reviewed. Constitutional: He is oriented to person, place, and time. He appears well-developed.  Obese  HENT:  Head: Normocephalic and atraumatic.  Right Ear: External ear normal.  Left Ear: External ear normal.  Eyes: Conjunctivae and EOM are normal. Pupils are equal, round, and reactive to light.  Neck: Normal range of motion and phonation normal. Neck supple.  Cardiovascular: Normal rate, regular rhythm, normal heart sounds and intact distal pulses.   Pulmonary/Chest: Effort normal and breath sounds normal. He exhibits no bony tenderness.  Abdominal: Soft. Normal appearance. There is no tenderness.  Musculoskeletal: Normal range of motion.  Bilateral calf  tenderness, left greater than right. Left calf is larger than the right. There is no apparent redness, fluctuance, or signs of cellulitis of either lower leg. The left foot is mildly swollen and diffusely tender. Right foot is tender, without swelling. Left wrist and hand are mildly swollen and diffusely tender. There is normal perfusion, by capillary refill, in the hands and feet, bilaterally.  Neurological: He is alert and oriented to person, place, and time. He has normal strength. No cranial nerve deficit or sensory deficit. He exhibits normal muscle tone. Coordination normal.  Skin: Skin is warm, dry and intact.  Psychiatric: His behavior is normal. Judgment and thought content normal.  Anxious     ED Course  Procedures (including critical care time)  Medications  0.9 %  sodium chloride infusion ( Intravenous Bolus from Bag 10/21/12 1934)  sodium chloride 0.9 % bolus 500 mL (0 mLs Intravenous Stopped 10/21/12 1934)  HYDROmorphone (DILAUDID) injection 1 mg (1 mg Intravenous Given 10/21/12 1834)  ondansetron (ZOFRAN) injection 4 mg (4 mg Intravenous Given 10/21/12 1833)  methylPREDNISolone sodium succinate (SOLU-MEDROL) 125 mg/2 mL injection 125 mg (125 mg Intravenous Given 10/21/12 1834)  cefTRIAXone (ROCEPHIN) 1 g in dextrose 5 % 50 mL IVPB (0 g Intravenous Stopped 10/21/12 2130)    Patient Vitals for the past 24 hrs:  BP Temp Temp src Pulse Resp SpO2 Height Weight  10/21/12 2058 129/62 mmHg 99.5 F (37.5 C) Oral 110 20 95 % - -  10/21/12 1641 145/76 mmHg 99.3 F (37.4 C) Oral 131 23 97 % 5\' 7"  (1.702 m) 413 lb (187.336 kg)    910 PM Reevaluation with update and discussion. After initial assessment and treatment, an updated evaluation reveals he feels somewhat better. He feels like he is unable to walk secondary to the pain. He feel that he cannot manage himself, at home, and his parents are to infirm to help. Suleiman Finigan L    9:24 PM-Consult complete with Dr. Sharl Ma. Patient case explained and discussed. He agrees to admit patient for further evaluation and treatment. Call ended at 2133  Holding Orders written.  Labs Reviewed  CBC WITH DIFFERENTIAL - Abnormal; Notable for the following:    WBC 14.2 (*)    Hemoglobin 12.9 (*)    HCT 38.7 (*)    Neutrophils Relative % 80 (*)    Neutro Abs 11.3 (*)    Lymphocytes Relative 10 (*)    Monocytes Absolute 1.4 (*)    All other components within normal limits  BASIC METABOLIC PANEL - Abnormal; Notable for the following:    Glucose, Bld 116 (*)    All other components within normal limits  URINALYSIS, ROUTINE W REFLEX MICROSCOPIC - Abnormal; Notable for the following:    APPearance CLOUDY (*)    Hgb urine dipstick TRACE (*)     Ketones, ur TRACE (*)    Protein, ur TRACE (*)    Nitrite POSITIVE (*)    Leukocytes, UA LARGE (*)    All other components within normal limits  URIC ACID - Abnormal; Notable for the following:    Uric Acid, Serum 10.5 (*)    All other components within normal limits  URINE MICROSCOPIC-ADD ON - Abnormal; Notable for the following:    Bacteria, UA MANY (*)    All other components within normal limits  URINE CULTURE        1. Gout   2. Obesities, morbid   3. UTI (lower urinary tract infection)     MDM  Extremity discomfort, due to gout. Incidental urinary tract infection. Acute disability related to gout, and obesity. He does not have resources to manage himself at home, and states that he cannot afford to buy medication. For these reasons,  he cannot be treated at home  Nursing Notes Reviewed/ Care Coordinated, and agree without changes. Applicable Imaging Reviewed.  Interpretation of Laboratory Data incorporated into ED treatment   Plan: Admit  Flint Melter, MD 10/21/12 2134

## 2012-10-21 NOTE — ED Notes (Signed)
Pt c/o generalized pain-shoulders/arms/legs. States he has been our of his protonix and colchicine x 1 week.  Pt also c/o some sob upon ems arrival at home. Denies sob at present.

## 2012-10-22 DIAGNOSIS — N390 Urinary tract infection, site not specified: Secondary | ICD-10-CM

## 2012-10-22 DIAGNOSIS — M109 Gout, unspecified: Principal | ICD-10-CM

## 2012-10-22 LAB — CBC
HCT: 37 % — ABNORMAL LOW (ref 39.0–52.0)
Hemoglobin: 12.5 g/dL — ABNORMAL LOW (ref 13.0–17.0)
MCHC: 33.8 g/dL (ref 30.0–36.0)
RBC: 4.44 MIL/uL (ref 4.22–5.81)

## 2012-10-22 LAB — COMPREHENSIVE METABOLIC PANEL
ALT: 23 U/L (ref 0–53)
Alkaline Phosphatase: 59 U/L (ref 39–117)
BUN: 7 mg/dL (ref 6–23)
CO2: 28 mEq/L (ref 19–32)
Chloride: 99 mEq/L (ref 96–112)
GFR calc Af Amer: >90 mL/min (ref >90)
GFR calc non Af Amer: >90 mL/min (ref >90)
Glucose, Bld: 163 mg/dL — ABNORMAL HIGH (ref 70–99)
Potassium: 3.8 mEq/L (ref 3.5–5.1)
Sodium: 137 mEq/L (ref 135–145)
Total Bilirubin: 0.3 mg/dL (ref 0.3–1.2)
Total Protein: 7.4 g/dL (ref 6.0–8.3)

## 2012-10-22 MED ORDER — PREDNISONE 20 MG PO TABS
40.0000 mg | ORAL_TABLET | Freq: Every day | ORAL | Status: DC
  Administered 2012-10-22 – 2012-10-24 (×3): 40 mg via ORAL
  Filled 2012-10-22 (×3): qty 2

## 2012-10-22 MED ORDER — ENOXAPARIN SODIUM 80 MG/0.8ML ~~LOC~~ SOLN
80.0000 mg | SUBCUTANEOUS | Status: DC
  Administered 2012-10-22 – 2012-10-24 (×3): 80 mg via SUBCUTANEOUS
  Filled 2012-10-22 (×3): qty 0.8

## 2012-10-22 NOTE — Progress Notes (Addendum)
[facility] PROGRESS NOTE  [name]:[id number] DOB: 1985/09/14 DOA: 10/21/2012 PCP: No primary provider on file.  Assessment/Plan: 1. Acute gouty arthritis. Patient is on twice a day colchicine. He appears to have tolerated a prednisone taper on his last admission without any significant bleeding. We will start a course of prednisone at this time. He is on twice a day PPI. His family will bring in his crutches tomorrow to see if he is able to ambulate. 2. UTI. He patient was found have an abnormal UA and started on Rocephin. Followup cultures. 3. History GI bleeding. We'll not give any NSAID for gout due to history of bleeding. He is on prednisone but is also receiving twice a day Protonix. Follow closely. 4. Morbid obesity 5. Disposition. Anticipate discharge in the next 24-48 hours if his gout Improves.  Code Status: full code Family Communication: discussed with patient Disposition Plan: discharge home once improved   Consultants:  none  Procedures:  none  Antibiotics:  Rocephin 7/7  HPI/Subjective: Continued pain in legs bilaterally and left arm. Unable to walk.  Objective: Filed Vitals:   10/21/12 2058 10/21/12 2232 10/22/12 0458 10/22/12 1414  BP: 129/62 132/81 121/69 108/67  Pulse: 110 110 85 80  Temp: 99.5 F (37.5 C) 97.5 F (36.4 C) 97.3 F (36.3 C) 97.5 F (36.4 C)  TempSrc: Oral Oral Oral Oral  Resp: 20 20 20 20   Height:      Weight:  172.639 kg (380 lb 9.6 oz)    SpO2: 95% 95% 94% 95%    Intake/Output Summary (Last 24 hours) at 10/22/12 1947 Last data filed at 10/22/12 1700  Gross per 24 hour  Intake    980 ml  Output   1850 ml  Net   -870 ml   Filed Weights   10/21/12 1641 10/21/12 2232  Weight: 187.336 kg (413 lb) 172.639 kg (380 lb 9.6 oz)    Exam:   General:  No acute distress  Cardiovascular: S1, S2, regular rate and rhythm  Respiratory: Clear to auscultation bilaterally  Abdomen: Soft, obese, nontender, positive  bowel sounds  Musculoskeletal: Ankles appear edematous bilaterally, are not warm and did not have any erythema. Tender to touch bilaterally. Left hand appears to be swollen around several joints when compared to right. No warmth or erythema. Left elbow is mildly warm, no erythema. Post left hand and left elbow are tender to touch.   Data Reviewed: Basic Metabolic Panel:  Recent Labs Lab 10/21/12 1834 10/22/12 0540  NA 137 137  K 3.6 3.8  CL 97 99  CO2 29 28  GLUCOSE 116* 163*  BUN 7 7  CREATININE 0.63 0.52  CALCIUM 9.7 9.6   Liver Function Tests:  Recent Labs Lab 10/22/12 0540  AST 14  ALT 23  ALKPHOS 59  BILITOT 0.3  PROT 7.4  ALBUMIN 2.7*   No results found for this basename: LIPASE, AMYLASE,  in the last 168 hours No results found for this basename: AMMONIA,  in the last 168 hours CBC:  Recent Labs Lab 10/21/12 1834 10/22/12 0540  WBC 14.2* 11.0*  NEUTROABS 11.3*  --   HGB 12.9* 12.5*  HCT 38.7* 37.0*  MCV 82.9 83.3  PLT 377 354   Cardiac Enzymes: No results found for this basename: CKTOTAL, CKMB, CKMBINDEX, TROPONINI,  in the last 168 hours BNP (last 3 results) No results found for this basename: PROBNP,  in the last 8760 hours CBG: No results found for this basename: GLUCAP,  in the last 168 hours  No results found for this or any previous visit (from the past 240 hour(s)).   Studies: No results found.  Scheduled Meds: . cefTRIAXone (ROCEPHIN)  IV  1 g Intravenous Q24H  . colchicine  0.6 mg Oral BID  . enoxaparin (LOVENOX) injection  80 mg Subcutaneous Q24H  . pantoprazole  40 mg Oral BID AC   Continuous Infusions: . sodium chloride 75 mL/hr at 10/22/12 1634    Active Problems:   Acute gouty arthritis   Morbid obesity   UTI (lower urinary tract infection)    Time spent:    Andrae Claunch  [facility] Pager [phone]. If 7PM-7AM, please contact night-coverage at [email or web address], password [email or web address] 10/22/2012, 7:47 PM  LOS: 1  day

## 2012-10-22 NOTE — Progress Notes (Signed)
to the best of my knowledge  

## 2012-10-22 NOTE — Care Management Note (Addendum)
    Page 1 of 1   10/24/2012     2:12:45 PM   CARE MANAGEMENT NOTE 10/24/2012  Patient:  [name], [name]   Account Number:  [id number]  Date Initiated:  10/22/2012  Documentation initiated by:  Sharrie Rothman  Subjective/Objective Assessment:   Pt admitted from home with UTI and gout flare up. Pt lives with his mother and will return home at discharge. Pt is aware that medicaid app is pending.     Action/Plan:   CM explained to pt that we would be unable to help him with medications at discharge. Financial counselor is aware of pt admission. Will continue to follow for CM needs.   Anticipated DC Date:  10/24/2012   Anticipated DC Plan:  HOME/SELF CARE  In-house referral  Financial Counselor      DC Planning Services  CM consult      Choice offered to / List presented to:             Status of service:  Completed, signed off Medicare Important Message given?   (If response is "NO", the following Medicare IM given date fields will be blank) Date Medicare IM given:   Date Additional Medicare IM given:    Discharge Disposition:    Per UR Regulation:    If discussed at Long Length of Stay Meetings, dates discussed:    Comments:  10/24/12 8:30 Aristide Waggle Leanord Hawking RN BSN CM Spoke with pt, refused to let CM set up an appt. with a MD. Lajoyce Corners and asked her to see pt before DC (pt's request) Unable to refer for home health since he does not have a PCP.  10/22/12 1610 Arlyss Queen, RN BSN CM

## 2012-10-22 NOTE — Progress Notes (Signed)
PT Cancellation Note  Patient Details Name: [name] MRN: [id number] DOB: January 06, 1986   Cancelled Treatment:    Reason Eval/Treat Not Completed: Medical issues which prohibited therapy Pt states that his feet are so painful that he cannot place any weight on them.  He normally ambulates with crutches and I have asked his Dad to bring them in.  Pt is able to only slightly DF/PF either ankle.  Both feet are edemetous and warm, extremely painful to the touch.  Pt reports flare-up of gout in his left arm as well.  He states that he ran out of Colchicine and has had so much pain that he has had to crawl around the house for mobility.  He lives with his parents.  Per nursing report, this same situation has happened repeatedly in the past.  We will monitor daily and mobilize as soon as he can weight bear.  I do not anticipate that he will have any difficulty once his pain is under better control.  Myrlene Broker L 10/22/2012, 8:40 AM

## 2012-10-22 NOTE — Progress Notes (Signed)
Patient given educational print out for Gout.

## 2012-10-23 LAB — CBC
MCHC: 31.9 g/dL (ref 30.0–36.0)
Platelets: 313 10*3/uL (ref 150–400)
RDW: 13.7 % (ref 11.5–15.5)
WBC: 11 10*3/uL — ABNORMAL HIGH (ref 4.0–10.5)

## 2012-10-23 LAB — BASIC METABOLIC PANEL
Chloride: 105 mEq/L (ref 96–112)
GFR calc Af Amer: >90 mL/min (ref >90)
GFR calc non Af Amer: >90 mL/min (ref >90)
Potassium: 4.6 mEq/L (ref 3.5–5.1)
Sodium: 140 mEq/L (ref 135–145)

## 2012-10-23 LAB — URINE CULTURE

## 2012-10-23 NOTE — Progress Notes (Signed)
Patient seen, independently examined and chart reviewed. I agree with exam, assessment and plan discussed with Toya Smothers, NP.  Well known to me. [name] suffers from severe gout which and is now manifest in multiple joints including left elbow, left wrist, fingers, both ankles. He was previously on NSAIDs but developed a bleeding ulcer recently. He has been admitted since for gout and was discharged home on steroids and colchicine. He did fairly well at home until he ran out of his Protonix and colchicine. He then got to the point where he could not ambulate with his crutches. He was restarted on colchicine and treated with oral steroids. Overall he reports some improvement today especially in his right foot and left hand. He is currently trying to get Medicaid.   He appears calm and comfortable. He has edema bilateral feet and ankles without significant warmth or erythema. His left hand is obviously edematous especially in the middle finger he has limited range of movement at the wrist. He also has pain and limited range of movement at the left elbow.  He needs to work with therapy. Treatment options are very limited and although he has a history of recent GI bleed steroids seem unavoidable at this point. His gout is polyarticular and joint injection is not practical. It is unlikely colchicine at this point will resolve this acute attack although he appears to have responded well to this from a maintenance standpoint. I think we have no choice but to treat with steroids and continue PPI therapy. Omeprazole may be a more affordable alternative for him until he can obtain Medicaid. Most likely will be ready for discharge 7/10.  Brendia Sacks, MD [facility] [phone]

## 2012-10-23 NOTE — Progress Notes (Signed)
PT Cancellation Note  Patient Details Name: [name] MRN: [id number] DOB: May 26, 1985   Cancelled Treatment:    Reason Eval/Treat Not Completed: Medical issues which prohibited therapy.  Pt in the dark with covers over his head and would not respond to therapist at first.  Pt states mother is going to bring his crutches in sometime today and he does not want to walk at this time due to not getting any sleep.  Pt will be seen tomorrow.   RUSSELL,CINDY 10/23/2012, 10:14 AM

## 2012-10-23 NOTE — Progress Notes (Signed)
[facility] PROGRESS NOTE  JACKIE LITTLEJOHN WUJ:[id number] DOB: [birth year] DOA: 10/21/2012 PCP: No primary provider on file.  Assessment/Plan: 1. Acute gouty arthritis. Patient is on twice a day colchicine and ran out of medicine and cannot afford refill. He reports not taking colchicine for at least 2 weeks. He appears to have tolerated a prednisone taper on his last admission without any significant bleeding. Little improvement today. Continue current course of prednisone day #2. Also received solumedrol on admission. continue PPI BID. Unable to ambulate today due to pain in feet. Has crutches and will attempt again in am with PT.  2. UTI. He patient was found have an abnormal UA and started on Rocephin. Culture pending. Afebrile, WC stable at 11.o likely related to steroids. Non-toxic appearing.  3. History GI bleeding. We'll not give any NSAID for gout due to history of bleeding. continue prednisone and twice a day Protonix. Follow closely. 4. Morbid obesity: BMI 64.8. Nutritional consult 5. Disposition. Anticipate discharge in the next 24-48 hours if his gout Improves.    Code Status: full Family Communication: family at bedside Disposition Plan: home when ready   Consultants:  none  Procedures:  none  Antibiotics:  Rocephin 10/21/12>>>  HPI/Subjective: Sitting up in bed watching TV. Reports continued pain in left hand, arm elbow and left foot and left ankle. Denies abdominal pain/nausea.   Objective: Filed Vitals:   10/22/12 1414 10/22/12 2100 10/23/12 0500 10/23/12 1447  BP: 108/67 124/77 114/66 129/83  Pulse: 80 71 60 78  Temp: 97.5 F (36.4 C) 97.4 F (36.3 C) 97.4 F (36.3 C) 97.6 F (36.4 C)  TempSrc: Oral Oral Oral Oral  Resp: 20   20  Height:      Weight:      SpO2: 95% 95% 97% 96%    Intake/Output Summary (Last 24 hours) at 10/23/12 1540 Last data filed at 10/23/12 0533  Gross per 24 hour  Intake 2413.75 ml  Output    750 ml  Net 1663.75 ml    Filed Weights   10/21/12 1641 10/21/12 2232  Weight: 187.336 kg (413 lb) 172.639 kg (380 lb 9.6 oz)    Exam:   General:  Morbidly obese NAD  Cardiovascular: RRR No MGR No LE edema  Respiratory: normal effort BS clear bilaterally no wheeze  Abdomen: obese soft +BS non-tender to palpation  Musculoskeletal: bilateral feet with mild edema and tenderness to touch particularly left foot lateral aspect. No erythema. Left hand and left elbow very tender to touch. No erythema no warmth.   Data Reviewed: Basic Metabolic Panel:  Recent Labs Lab 10/21/12 1834 10/22/12 0540 10/23/12 0548  NA 137 137 140  K 3.6 3.8 4.6  CL 97 99 105  CO2 29 28 30   GLUCOSE 116* 163* 137*  BUN 7 7 9   CREATININE 0.63 0.52 0.48*  CALCIUM 9.7 9.6 8.7   Liver Function Tests:  Recent Labs Lab 10/22/12 0540  AST 14  ALT 23  ALKPHOS 59  BILITOT 0.3  PROT 7.4  ALBUMIN 2.7*   No results found for this basename: LIPASE, AMYLASE,  in the last 168 hours No results found for this basename: AMMONIA,  in the last 168 hours CBC:  Recent Labs Lab 10/21/12 1834 10/22/12 0540 10/23/12 0548  WBC 14.2* 11.0* 11.0*  NEUTROABS 11.3*  --   --   HGB 12.9* 12.5* 11.6*  HCT 38.7* 37.0* 36.4*  MCV 82.9 83.3 85.4  PLT 377 354 313   Cardiac Enzymes: No  results found for this basename: CKTOTAL, CKMB, CKMBINDEX, TROPONINI,  in the last 168 hours BNP (last 3 results) No results found for this basename: PROBNP,  in the last 8760 hours CBG: No results found for this basename: GLUCAP,  in the last 168 hours  Recent Results (from the past 240 hour(s))  URINE CULTURE     Status: None   Collection Time    10/21/12  7:45 PM      Result Value Range Status   Specimen Description URINE, CLEAN CATCH   Final   Special Requests NONE   Final   Culture  Setup Time 10/21/2012 20:45   Final   Colony Count PENDING   Incomplete   Culture Culture reincubated for better growth   Final   Report Status PENDING    Incomplete     Studies: No results found.  Scheduled Meds: . cefTRIAXone (ROCEPHIN)  IV  1 g Intravenous Q24H  . colchicine  0.6 mg Oral BID  . enoxaparin (LOVENOX) injection  80 mg Subcutaneous Q24H  . pantoprazole  40 mg Oral BID AC  . predniSONE  40 mg Oral Q breakfast   Continuous Infusions: . sodium chloride 75 mL/hr at 10/23/12 0533    Active Problems:   Acute gouty arthritis   Morbid obesity   UTI (lower urinary tract infection)    Time spent: 30 minutes    [facility] M  [facility] Pager [phone]. If 7PM-7AM, please contact night-coverage at [email or web address], password [email or web address] 10/23/2012, 3:40 PM  LOS: 2 days

## 2012-10-24 MED ORDER — HYDROCODONE-ACETAMINOPHEN 5-325 MG PO TABS: 1.0000 | ORAL_TABLET | ORAL | Status: DC | PRN

## 2012-10-24 MED ORDER — OMEPRAZOLE 20 MG PO CPDR: 20.0000 mg | DELAYED_RELEASE_CAPSULE | Freq: Two times a day (BID) | ORAL | Status: DC

## 2012-10-24 MED ORDER — PREDNISONE 10 MG PO TABS: ORAL_TABLET | ORAL | Status: DC

## 2012-10-24 NOTE — Plan of Care (Signed)
Problem: Phase I Progression Outcomes Goal: Pain controlled with appropriate interventions Outcome: Not Progressing PT attempted, pt refused. Sheryn Bison      Problem: Phase III Progression Outcomes Goal: Activity at appropriate level-compared to baseline (UP IN CHAIR FOR HEMODIALYSIS)  Outcome: Completed/Met Date Met:  10/24/12 Pt states he generally uses crutches. He has not yet; however, PT made multiple attempts to work with pt, all of which he refused. We attempted to set up Home Health which we were also unable to do due to pts refusal of a PCP at this time. Sheryn Bison

## 2012-10-24 NOTE — Progress Notes (Signed)
Pt verbalizes understanding of d/c instructions, new medications, and needed follow up appts. I emphasized importance of prescriptions, and following up with a PCP. Pt continues to state concerns about finances. Case Management has been involved. I informed pt that he could get his prednisone prescription for $4 from [facility]. Pt d/c home via EMS. He will be staying with his mother and stepfather. Home Health, as well as a wheelchair was offered to the pt, but he refused. IV d/c without complications. Sheryn Bison

## 2012-10-24 NOTE — Progress Notes (Signed)
Pt is aware that he is going to be d/c today, as he was informed yesterday. PT was called and came to work with him. He again refused to work with PT stating that he could not move, and wouldn't even be able to hold his crutches on his own. I informed him of the importance of at least trying to work with PT. He states that when he attempted to dress himself yesterday, he wasn't able to bear any weight on his foot. When I mentioned discharge, pt again became irritable. I explained to him that the treatment he is currently receiving can be continued at home, especially considering the fact that he has refused PT. He then told me he couldn't go yet because his mother was at a Dr's appt herself.  I informed him that we could call EMS to help transport him home. I asked if a wheelchair would be of assistance, and he states that he is unable to fit a wheelchair in his home. After consulting with Ubaldo Glassing, RN and Loletta Specter, RN, as well as pts physician, it was decided that Home Health would be the best option for this pt. Case Management Consulted and currently awaiting their input for [facility]. The man present at bedside, assumed to be step-father, just stated that pts mother has locked herself in her room, and is not going to open the door for patient to come in. Also, stated that medicaid is pending and pt is unable to have any HH, Case Management to discuss this aspect with patient and family. Sheryn Bison

## 2012-10-24 NOTE — Progress Notes (Addendum)
[facility] PROGRESS NOTE  [name] ZOX:[id number] DOB: 22-Dec-1985 DOA: 10/21/2012 PCP: No primary provider on file.  Assessment/Plan: 1. Acute gouty arthritis: Multiple joints including left elbow, left wrist, fingers, both ankles. Slow improvement. Continue prednisone.  2. History of upper GI bleed secondary to gastric ulcer 08/2012, NSAID-induced. 3. Morbid obesity: Continue to encourage weight loss.  Very difficult case. 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 He has had gout for many years which he treated with Naprosyn prior to his GI bleed in May. Since May his gout has improved but has never totally resolved. He had been somewhat stable on colchicine (as an outpatient) but when he ran out of this medication the flare recurred. He had been out of the medication for at least 2 weeks prior to this admission. Given the polyarticular nature of his gout, injection not practical. Colchicine has not helped break this flare and is not the best choice for maintenance. As previously documented, think the best option (though not without risk) is steroids and continue PPI therapy. We will place him on moderate steroid taper. Although steroids place him in a risk for GI bleed, there is is not a good alternative. His previous GI bleed occurred in the context of prolonged daily use of NSAIDs and therefore short course of steroids can likely be tolerated. Once he is over this gout flare, allopurinol would be ideal maintenance therapy as it is quite affordable. He cannot afford colchicine and will not take it and therefore there is no utility in discharging him on it. Prednisone and PPI therapy are a more reasonable course at this point. He needs close followup with a primary care physician which he has thus far refused. He is working to get disability and Medicaid. I stressed to him that he needs to establish with a primary care  physician and hopefully could be referred to a rheumatologist. I do not think there is further benefit to ongoing hospitalization. He has exhausted resources for medication assistance and has no skillable criteria at this point. Without medication compliance, this will recur. Medication therapy can be conducted in the outpatient setting.    Home today  Prednisone 40mg  po daily x 4 days, then 30 mg po daily x 3 days, then 20 mg po daily x3 days, then 10 mg po daily x3 days, then stop.  BID Prilosec (more affordable)  No NSAIDs  Consider allopurinol when stable  Code Status: full code DVT prophylaxis: Lovenox Family Communication: discussed with stepfather at bedside Disposition Plan: home  Brendia Sacks, MD  [facility]  Pager [phone] If 7PM-7AM, please contact night-coverage at [email or web address], password [facility] 10/24/2012, 10:46 AM  LOS: 3 days   Clinical Summary: [name] suffers from severe gout which and is now manifest in multiple joints including left elbow, left wrist, fingers, both ankles. He was previously on NSAIDs but developed a bleeding ulcer recently. He has been admitted since for gout and was discharged home on steroids and colchicine. He did fairly well at home until he ran out of his Protonix and colchicine. He then got to the point where he could not ambulate with his crutches. He was restarted on colchicine and treated with oral steroids.   Consultants:  none  Procedures:  none  HPI/Subjective: Overall he feels a little better. He still has pain in bilateral ankles as well as feet, left elbow and left hand although there has been some improvement.  Objective: Filed Vitals:   10/23/12 0500 10/23/12 1447 10/23/12 2206 10/24/12 0535  BP: 114/66 129/83 127/79 117/72  Pulse: 60 78 91 72  Temp: 97.4 F (36.3 C) 97.6 F (36.4 C) 97.7 F (36.5 C) 97.3 F (36.3 C)  TempSrc: Oral Oral Oral Oral  Resp:  20 20 20   Height:      Weight:      SpO2: 97%  96% 92% 100%    Intake/Output Summary (Last 24 hours) at 10/24/12 1046 Last data filed at 10/24/12 0925  Gross per 24 hour  Intake 1677.5 ml  Output    400 ml  Net 1277.5 ml     Filed Weights   10/21/12 1641 10/21/12 2232  Weight: 187.336 kg (413 lb) 172.639 kg (380 lb 9.6 oz)    Exam:   General: He appears calm and comfortable.  Cardiovascular: Regular rate and rhythm. No murmur, rub, gallop.  Respiratory: Clear to auscultation bilaterally. No wheezes, rales, rhonchi. Normal respiratory effort.  Musculoskeletal: No significant change in his musculoskeletal exam. He has some edema bilateral feet with some tenderness in bilateral ankles. He has some tenderness in the left breast is able to flex and extend his wrist and grip fairly weakly. He is able to flex and extend his elbow very well.  Data Reviewed:  Urine culture was unrevealing. He received 3 days of Rocephin. No further therapy recommended.  Pending studies:   None  Scheduled Meds: . cefTRIAXone (ROCEPHIN)  IV  1 g Intravenous Q24H  . colchicine  0.6 mg Oral BID  . enoxaparin (LOVENOX) injection  80 mg Subcutaneous Q24H  . pantoprazole  40 mg Oral BID AC  . predniSONE  40 mg Oral Q breakfast   Continuous Infusions: . sodium chloride 50 mL/hr at 10/23/12 2051    Principal Problem:   Acute gouty arthritis Active Problems:   Morbid obesity   UTI (lower urinary tract infection)

## 2012-10-24 NOTE — Clinical Social Work Note (Signed)
CSW arranged transport home for pt via [facility] at his request as he said there is no way he can get into his home, even with assistance from family. Pt aware there is no guarantee of payment and reports understanding.   Derenda Fennel, [place] [phone]

## 2012-10-24 NOTE — Plan of Care (Signed)
Problem: Phase II Progression Outcomes Goal: Progress activity as tolerated unless otherwise ordered Outcome: Not Met (add Reason) Pt refused PT     

## 2012-10-24 NOTE — Progress Notes (Signed)
PT Cancellation Note  Patient Details Name: [name] MRN: [id number] DOB: [birth year]   Cancelled Treatment:    Reason Eval/Treat Not Completed: Patient declined, no reason specified;Pain limiting ability to participate Pt is lying in bed, quite angry that he is being discharged.  "I can't even hold onto a crutch much less walk".  I tried to convince him to try to sit at [facility] but he flatly refuses, angry that the doctor "doesn't care about me".  He refused to allow me to look at his feet since "the doctor already looked at them and made them hurt more".  He states that his family will not be able to take him home.  When I discussed having him use a w/c he stated that one would not fit inside of his house.    Myrlene Broker L 10/24/2012, 12:09 PM

## 2012-10-24 NOTE — Discharge Summary (Signed)
Patient seen and examined. I agree with discharge. See my progress note dated same day.  Options are limited. Patient refuses to participate in his own therapy and has refused to followup with a primary care physician. We discussed discharge yesterday and he was understanding.  Pain appears out of proportion to exam findings. Overall range of movement appears improved. Overall he appears clinically improved and stable for discharge. Risk for rehospitalization very high, however a multidisciplinary approach has been pursued and all options have been exhausted, or offered and refused. If he can remain compliant with therapy then starting allopurinol in the outpatient setting for maintenance would be ideal.  It was stressed that he needs to continue PPI therapy.  Brendia Sacks, MD [facility] [phone]

## 2012-10-24 NOTE — Discharge Summary (Signed)
Physician Discharge Summary  [name] VHQ:[id number] DOB: 10/18/1985 DOA: 10/21/2012  PCP: No primary provider on file.  Admit date: 10/21/2012 Discharge date: 10/24/2012  Time spent: 40 minutes  Recommendations for Outpatient Follow-up:  1. Pt refused assistance for establishing with a PCP. Have strongly encouraged him to reconsider. His plan is to await medicaid approval and will then get himself established with a PCP.  2. Have directed him to avoid NSAD 3. Have instructed him to take medications as directed 4. Will request HH RN,PT and SW for monitoring and evaluation of condition  Discharge Diagnoses:  Principal Problem:   Acute gouty arthritis Active Problems:   Morbid obesity   UTI (lower urinary tract infection)   Discharge Condition: stable  Diet recommendation: regular  Filed Weights   10/21/12 1641 10/21/12 2232  Weight: 187.336 kg (413 lb) 172.639 kg (380 lb 9.6 oz)    History of present illness:  56 old male with a history of gout presented to the hospital on 10/21/12 because of worsening leg pain and swelling and left wrist and shoulder pain for one week prior to presentation. Patient reported running out of his colchicine almost 2 weeks prior as patient cannot afford to buy any more. Patient unable to ambulate due to pain in the legs. He denied fever no chest pain shortness of breath no nausea or diarrhea. Patient with have a history of gastric ulcer and takes Protonix.   Hospital Course:  1. Acute gouty arthritis. Admitted to medical floor. Multiple joints including left elbow, left wrist, finger both ankles. Given solumedrol in ED.  Was started on pain medicine and prednisone. Was given colchicine as well. colchicine has not helped break this flare and is not the best choice for maintenance. Therefore, will be discharged with 14 day prednisone taper and prilosec BID.  He was previously on NSAIDS but developed a bleeding ulcer recently. He appears to have  tolerated a prednisone taper on his last admission without any significant bleeding. Once over gout flare, allopurinol would be best maintenance therapy and is affordable. He cannot affort colchicine and will not take it and therefore no utility in discharging him on it. Recommended close follow up with PCP and hopeful referral to rheumatologist. Pt refused assistance with setting up PCP as he currently does not have one. His plan is to establish with PCP himself once medicaid and disability applications complete. Unfortunately all resources available for medication assistance have been exhausted and he has no skillable criteria currently. In addition, he has refused what limited therapy has been offered by refusing to participate in physical therapy.     2. UTI. He patient was found have an abnormal UA and started on Rocephin. Culture yields multiple bacterial morphotypes present none predominant. Pt remained afebrile, WC stable at 11.o likely related to steroids. Non-toxic appearing. Completed 3 days of rocephin 3. History GI bleeding. Secondary to daily NSAID for gout. See #1. Will continue prednisone and twice a day prilosec at discharge. Recommend follow up with PCP once established 4. Morbid obesity: BMI 64.8. Encourage weight loss     Procedures:none Consultations:  none  Discharge Exam: Filed Vitals:   10/23/12 0500 10/23/12 1447 10/23/12 2206 10/24/12 0535  BP: 114/66 129/83 127/79 117/72  Pulse: 60 78 91 72  Temp: 97.4 F (36.3 C) 97.6 F (36.4 C) 97.7 F (36.5 C) 97.3 F (36.3 C)  TempSrc: Oral Oral Oral Oral  Resp:  20 20 20   Height:  Weight:      SpO2: 97% 96% 92% 100%    General: morbidly obese NAD Cardiovascular: RRR No MGR  Respiratory: normal effort BS clear to ausculation bilaterally no rhonchi Abdomen: obese soft +BS Musculoskeletal: bilateral feet with mild edema and less tenderness today in ankles. No warmth or erythema. ROM to left arm at elbow much  improved at discharge.  Discharge Instructions  Discharge Orders   Future Appointments Provider Department Dept Phone   12/06/2012 10:30 AM [facility] [phone]   Future Orders Complete By Expires     Call MD for:  difficulty breathing, headache or visual disturbances  As directed     Call MD for:  severe uncontrolled pain  As directed     Diet - low sodium heart healthy  As directed     Discharge instructions  As directed     Comments:      Take medication as directed  Encourage you to reconsider selecting a PCP and recommend referral to rheumatologist   If symptoms return seek medical attention    Increase activity slowly  As directed         Medication List    STOP taking these medications       colchicine 0.6 MG tablet     pantoprazole 40 MG tablet  Commonly known as:  PROTONIX      TAKE these medications       HYDROcodone-acetaminophen 5-325 MG per tablet  Commonly known as:  NORCO/VICODIN  Take 1-2 tablets by mouth every 4 (four) hours as needed.     omeprazole 20 MG capsule  Commonly known as:  PRILOSEC  Take 1 capsule (20 mg total) by mouth 2 (two) times daily.     predniSONE 10 MG tablet  Commonly known as:  DELTASONE  Take 4 tabs for 4 days take 3 tabs for 3 days take 2 tabs for 3 days take 1 tab for 3 days then stop.       Allergies  Allergen Reactions  . Asa (Aspirin) Other (See Comments)    Pt doesn't know reaction.  Marland Kitchen Penicillins Other (See Comments)    Pt doesn't know reaction.  . Sulfa Antibiotics Other (See Comments)    Pt doesn't know reaction.       Follow-up Information   Please follow up.   Contact information:   Patient refused assistance with contacting PCP. Plans to establish with a PCP once his medicaid and disability application process complete. strongly recommend reconsidering establishing with PCP        The results of significant diagnostics from this hospitalization  (including imaging, microbiology, ancillary and laboratory) are listed below for reference.    Significant Diagnostic Studies: No results found.  Microbiology: Recent Results (from the past 240 hour(s))  URINE CULTURE     Status: None   Collection Time    10/21/12  7:45 PM      Result Value Range Status   Specimen Description URINE, CLEAN CATCH   Final   Special Requests NONE   Final   Culture  Setup Time 10/21/2012 20:45   Final   Colony Count >=100,000 COLONIES/ML   Final   Culture     Final   Value: Multiple bacterial morphotypes present, none predominant. Suggest appropriate recollection if clinically indicated.   Report Status 10/23/2012 FINAL   Final     Labs: Basic Metabolic Panel:  Recent Labs Lab 10/21/12 1834 10/22/12 0540 10/23/12 0981  NA 137 137 140  K 3.6 3.8 4.6  CL 97 99 105  CO2 29 28 30   GLUCOSE 116* 163* 137*  BUN 7 7 9   CREATININE 0.63 0.52 0.48*  CALCIUM 9.7 9.6 8.7   Liver Function Tests:  Recent Labs Lab 10/22/12 0540  AST 14  ALT 23  ALKPHOS 59  BILITOT 0.3  PROT 7.4  ALBUMIN 2.7*   No results found for this basename: LIPASE, AMYLASE,  in the last 168 hours No results found for this basename: AMMONIA,  in the last 168 hours CBC:  Recent Labs Lab 10/21/12 1834 10/22/12 0540 10/23/12 0548  WBC 14.2* 11.0* 11.0*  NEUTROABS 11.3*  --   --   HGB 12.9* 12.5* 11.6*  HCT 38.7* 37.0* 36.4*  MCV 82.9 83.3 85.4  PLT 377 354 313   Cardiac Enzymes: No results found for this basename: CKTOTAL, CKMB, CKMBINDEX, TROPONINI,  in the last 168 hours BNP: BNP (last 3 results) No results found for this basename: PROBNP,  in the last 8760 hours CBG: No results found for this basename: GLUCAP,  in the last 168 hours     Signed:  Gwenyth Bender  [facility] 10/24/2012, 11:55 AM

## 2012-10-24 NOTE — Progress Notes (Signed)
Pt is unable to receive Home Health Assistance due to refusing a PCP. Also, pt states that he can't afford any assistance, due to the fact that his Medicaid is pending. Sheryn Bison

## 2012-11-07 ENCOUNTER — Inpatient Hospital Stay (HOSPITAL_COMMUNITY)
Admission: EM | Admit: 2012-11-07 | Discharge: 2012-11-13 | DRG: 554 | Disposition: A | Discharge status: Nursing Home (Includes ICF) | Payer: Medicaid Other | Attending: Internal Medicine | Admitting: Internal Medicine

## 2012-11-07 ENCOUNTER — Encounter (HOSPITAL_COMMUNITY): Payer: Self-pay | Admitting: Emergency Medicine

## 2012-11-07 ENCOUNTER — Emergency Department (HOSPITAL_COMMUNITY): Payer: Medicaid Other

## 2012-11-07 DIAGNOSIS — B353 Tinea pedis: Secondary | ICD-10-CM | POA: Diagnosis present

## 2012-11-07 DIAGNOSIS — Z9119 Patient's noncompliance with other medical treatment and regimen: Secondary | ICD-10-CM

## 2012-11-07 DIAGNOSIS — R52 Pain, unspecified: Secondary | ICD-10-CM | POA: Diagnosis present

## 2012-11-07 DIAGNOSIS — Z6841 Body Mass Index (BMI) 40.0 and over, adult: Secondary | ICD-10-CM

## 2012-11-07 DIAGNOSIS — M109 Gout, unspecified: Principal | ICD-10-CM | POA: Diagnosis present

## 2012-11-07 DIAGNOSIS — T380X5A Adverse effect of glucocorticoids and synthetic analogues, initial encounter: Secondary | ICD-10-CM | POA: Diagnosis present

## 2012-11-07 DIAGNOSIS — L0390 Cellulitis, unspecified: Secondary | ICD-10-CM | POA: Diagnosis present

## 2012-11-07 DIAGNOSIS — M130 Polyarthritis, unspecified: Secondary | ICD-10-CM | POA: Diagnosis present

## 2012-11-07 DIAGNOSIS — R7309 Other abnormal glucose: Secondary | ICD-10-CM | POA: Diagnosis present

## 2012-11-07 DIAGNOSIS — Z91199 Patient's noncompliance with other medical treatment and regimen due to unspecified reason: Secondary | ICD-10-CM

## 2012-11-07 DIAGNOSIS — Z87891 Personal history of nicotine dependence: Secondary | ICD-10-CM

## 2012-11-07 DIAGNOSIS — Z8711 Personal history of peptic ulcer disease: Secondary | ICD-10-CM

## 2012-11-07 DIAGNOSIS — B354 Tinea corporis: Secondary | ICD-10-CM | POA: Diagnosis present

## 2012-11-07 LAB — BASIC METABOLIC PANEL
BUN: 8 mg/dL (ref 6–23)
CO2: 30 mEq/L (ref 19–32)
Chloride: 93 mEq/L — ABNORMAL LOW (ref 96–112)
Creatinine, Ser: 0.59 mg/dL (ref 0.50–1.35)
GFR calc Af Amer: >90 mL/min (ref >90)
Potassium: 4.1 mEq/L (ref 3.5–5.1)

## 2012-11-07 LAB — URINALYSIS, ROUTINE W REFLEX MICROSCOPIC
Glucose, UA: NEGATIVE mg/dL
Ketones, ur: 15 mg/dL — AB
Leukocytes, UA: NEGATIVE
Specific Gravity, Urine: 1.025 (ref 1.005–1.030)
pH: 6.5 (ref 5.0–8.0)

## 2012-11-07 LAB — CBC WITH DIFFERENTIAL/PLATELET
Basophils Absolute: 0 10*3/uL (ref 0.0–0.1)
Basophils Relative: 0 % (ref 0–1)
HCT: 36.7 % — ABNORMAL LOW (ref 39.0–52.0)
Hemoglobin: 12.2 g/dL — ABNORMAL LOW (ref 13.0–17.0)
Lymphocytes Relative: 11 % — ABNORMAL LOW (ref 12–46)
MCHC: 33.2 g/dL (ref 30.0–36.0)
Monocytes Absolute: 2.1 10*3/uL — ABNORMAL HIGH (ref 0.1–1.0)
Monocytes Relative: 12 % (ref 3–12)
Neutro Abs: 13.1 10*3/uL — ABNORMAL HIGH (ref 1.7–7.7)
Neutrophils Relative %: 77 % (ref 43–77)
WBC: 17 10*3/uL — ABNORMAL HIGH (ref 4.0–10.5)

## 2012-11-07 LAB — URIC ACID: Uric Acid, Serum: 8.8 mg/dL — ABNORMAL HIGH (ref 4.0–7.8)

## 2012-11-07 MED ORDER — ACETAMINOPHEN 500 MG PO TABS
1000.0000 mg | ORAL_TABLET | Freq: Once | ORAL | Status: AC
Start: 2012-11-07 — End: 2012-11-07
  Administered 2012-11-07: 1000 mg via ORAL
  Filled 2012-11-07: qty 2

## 2012-11-07 MED ORDER — SODIUM CHLORIDE 0.9 % IV SOLN
Freq: Once | INTRAVENOUS | Status: AC
  Administered 2012-11-07: 23:00:00 via INTRAVENOUS

## 2012-11-07 MED ORDER — HYDROMORPHONE HCL PF 1 MG/ML IJ SOLN
1.0000 mg | Freq: Once | INTRAMUSCULAR | Status: AC
  Administered 2012-11-07: 1 mg via INTRAVENOUS
  Filled 2012-11-07: qty 1

## 2012-11-07 MED ORDER — ONDANSETRON HCL 4 MG/2ML IJ SOLN
4.0000 mg | Freq: Once | INTRAMUSCULAR | Status: AC
  Administered 2012-11-07: 4 mg via INTRAVENOUS
  Filled 2012-11-07: qty 2

## 2012-11-07 NOTE — ED Notes (Signed)
Patient presents to ER via EMS with c/o gout flareup.  Patient states has not had gout medicine for 2 weeks.

## 2012-11-07 NOTE — ED Provider Notes (Signed)
CSN: [id number]     Arrival date & time 11/07/12  2022 History     First MD Initiated Contact with Patient 11/07/12 2157     Chief Complaint  Patient presents with  . Gout   (Consider location/radiation/quality/duration/timing/severity/associated sxs/prior Treatment) HPI Comments: [name] is a 27 y.o. Morbidly obese male who presents to the Emergency Department complaining of generalized pain to both feet, right shoulder and bilateral hands.  States that he has hx of gout and has been unable to afford his medications for his two weeks. On his last hospitalization, he states that he received prednisone which did not help.  States that he has been unable to get out of bed for 3 days because his pain is so intense when he tries to move his arms and feet.  He also c/o chills today.  He denies chest pain, shortness of breath, fever , abdominal pain or vomiting.  He states that he lives at home with his parents and his mother told him " you have to go I can't help take care of you anymore".  Patient is requesting facility placement   The history is provided by the patient.    Past Medical History  Diagnosis Date  . Gout   . Cellulitis   . Obesity   . Erosive esophagitis 09/01/2012    NSAID induced.  . Gastric ulcer with hemorrhage 09/02/2012    s/p bleeding control tx. Per Dr. Jena Gauss  . Acute blood loss anemia 09/02/2012    S/p 1 unit rbcs   Past Surgical History  Procedure Laterality Date  . Tonsillectomy    . Esophagogastroduodenoscopy Left 09/01/2012    Procedure: ESOPHAGOGASTRODUODENOSCOPY (EGD);  Surgeon: Corbin Ade, MD;  Location: [facility];  Service: Endoscopy;  Laterality: Left;   Family History  Problem Relation Age of Onset  . Heart failure Mother    History  Substance Use Topics  . Smoking status: Former Smoker -- 0.50 packs/day for 7 years    Types: Cigarettes  . Smokeless tobacco: Former Neurosurgeon    Types: Snuff  . Alcohol Use: Yes     Comment: very  occasional    Review of Systems  Constitutional: Positive for chills. Negative for fever, activity change and appetite change.  HENT: Negative for facial swelling, neck pain and neck stiffness.   Respiratory: Negative for cough, chest tightness, shortness of breath and wheezing.   Cardiovascular: Negative for chest pain.  Gastrointestinal: Negative for nausea, vomiting and abdominal pain.  Genitourinary: Negative for dysuria and frequency.  Musculoskeletal: Positive for arthralgias. Negative for back pain and joint swelling.  Skin: Negative for color change, rash and wound.  Neurological: Negative for dizziness, syncope, weakness, numbness and headaches.  Hematological: Negative for adenopathy.  Psychiatric/Behavioral: Negative for confusion.  All other systems reviewed and are negative.    Allergies  Asa; Penicillins; and Sulfa antibiotics  Home Medications  No current outpatient prescriptions on file. BP 131/70  Pulse 132  Resp 20  Ht 5\' 7"  (1.702 m)  Wt 400 lb (181.439 kg)  BMI 62.63 kg/m2  SpO2 96% Physical Exam  Nursing note and vitals reviewed. Constitutional: He is oriented to person, place, and time.  Morbidly obese, uncomfortable appearing  HENT:  Head: Normocephalic and atraumatic.  Mouth/Throat: Oropharynx is clear and moist.  Neck: Normal range of motion. Neck supple.  Cardiovascular: Normal heart sounds and intact distal pulses.  Tachycardia present.  Exam reveals no decreased pulses.   No murmur heard.  Pulmonary/Chest: Effort normal and breath sounds normal. No respiratory distress. He exhibits no tenderness.  Abdominal: Soft. He exhibits no distension. There is no tenderness. There is no rebound and no guarding.  Musculoskeletal: He exhibits tenderness.  Patient has grimacing pain with palpation to the bilateral hands, feet and right shoulder.  Mild STS of the dorsum of the bilateral feet and localized edema of the proximal left fourth finger which patient  reports is chronic.  Distal sensation intact, no rash , red streaks, calf pain or swelling, or DP and radial pulses are brisk and equal bilaterally  Lymphadenopathy:    He has no cervical adenopathy.  Neurological: He is alert and oriented to person, place, and time. He exhibits normal muscle tone. Coordination normal.  Skin: Skin is warm. No rash noted. No erythema.    ED Course   Procedures (including critical care time)  Results for orders placed during the hospital encounter of 11/07/12  CBC WITH DIFFERENTIAL      Result Value Range   WBC 17.0 (*) 4.0 - 10.5 K/uL   RBC 4.43  4.22 - 5.81 MIL/uL   Hemoglobin 12.2 (*) 13.0 - 17.0 g/dL   HCT 16.1 (*) 09.6 - 04.5 %   MCV 82.8  78.0 - 100.0 fL   MCH 27.5  26.0 - 34.0 pg   MCHC 33.2  30.0 - 36.0 g/dL   RDW 40.9  81.1 - 91.4 %   Platelets 272  150 - 400 K/uL   Neutrophils Relative % 77  43 - 77 %   Neutro Abs 13.1 (*) 1.7 - 7.7 K/uL   Lymphocytes Relative 11 (*) 12 - 46 %   Lymphs Abs 1.8  0.7 - 4.0 K/uL   Monocytes Relative 12  3 - 12 %   Monocytes Absolute 2.1 (*) 0.1 - 1.0 K/uL   Eosinophils Relative 0  0 - 5 %   Eosinophils Absolute 0.0  0.0 - 0.7 K/uL   Basophils Relative 0  0 - 1 %   Basophils Absolute 0.0  0.0 - 0.1 K/uL  BASIC METABOLIC PANEL      Result Value Range   Sodium 134 (*) 135 - 145 mEq/L   Potassium 4.1  3.5 - 5.1 mEq/L   Chloride 93 (*) 96 - 112 mEq/L   CO2 30  19 - 32 mEq/L   Glucose, Bld 140 (*) 70 - 99 mg/dL   BUN 8  6 - 23 mg/dL   Creatinine, Ser 7.82  0.50 - 1.35 mg/dL   Calcium 9.4  8.4 - 95.6 mg/dL   GFR calc non Af Amer >90  >90 mL/min   GFR calc Af Amer >90  >90 mL/min  URIC ACID      Result Value Range   Uric Acid, Serum 8.8 (*) 4.0 - 7.8 mg/dL  URINALYSIS, ROUTINE W REFLEX MICROSCOPIC      Result Value Range   Color, Urine AMBER (*) YELLOW   APPearance CLEAR  CLEAR   Specific Gravity, Urine 1.025  1.005 - 1.030   pH 6.5  5.0 - 8.0   Glucose, UA NEGATIVE  NEGATIVE mg/dL   Hgb urine  dipstick TRACE (*) NEGATIVE   Bilirubin Urine SMALL (*) NEGATIVE   Ketones, ur 15 (*) NEGATIVE mg/dL   Protein, ur TRACE (*) NEGATIVE mg/dL   Urobilinogen, UA 1.0  0.0 - 1.0 mg/dL   Nitrite NEGATIVE  NEGATIVE   Leukocytes, UA NEGATIVE  NEGATIVE  URINE MICROSCOPIC-ADD ON  Result Value Range   Squamous Epithelial / LPF RARE  RARE   RBC / HPF 0-2  <3 RBC/hpf   Bacteria, UA RARE  RARE    Dg Chest Portable 1 View  11/07/2012   *RADIOLOGY REPORT*  Clinical Data: Pain; history of gout  PORTABLE CHEST - 1 VIEW  Comparison: None.  Findings: Lungs clear.  Heart size and pulmonary vascularity are normal.  No adenopathy.  No bone lesions.  No pneumothorax.  IMPRESSION: No abnormality noted.   Original Report Authenticated By: Bretta Bang, M.D.    MDM   Patient with hx of frequent gout flares.  Unable to take NSAIDs due to hx of GI bleeding.  Cannot afford colchicine.    Patient is requesting admission. Has received IVF's, pain medication, solu-medrol, zofran and tylenol, on re-evaluation, he feels somewhat better. He feels like he is unable to walk secondary to the pain and states that he cannot manage himself at home and his parents are disabled and also unable to help him.    Patient was also seen by the EDP , Dr. Ignacia Palma, and care plan discussed.  Will consult hospitalist for admission  0110  Consulted Dr. Orvan Falconer, will admit.      Dimitra Woodstock L. Frazier Balfour, PA-C 11/08/12 0110

## 2012-11-08 ENCOUNTER — Encounter (HOSPITAL_COMMUNITY): Payer: Self-pay | Admitting: Internal Medicine

## 2012-11-08 DIAGNOSIS — M130 Polyarthritis, unspecified: Secondary | ICD-10-CM | POA: Diagnosis present

## 2012-11-08 DIAGNOSIS — B353 Tinea pedis: Secondary | ICD-10-CM

## 2012-11-08 LAB — COMPREHENSIVE METABOLIC PANEL
ALT: 21 U/L (ref 0–53)
AST: 13 U/L (ref 0–37)
Alkaline Phosphatase: 91 U/L (ref 39–117)
CO2: 29 mEq/L (ref 19–32)
Calcium: 9.4 mg/dL (ref 8.4–10.5)
Chloride: 95 mEq/L — ABNORMAL LOW (ref 96–112)
GFR calc non Af Amer: >90 mL/min (ref >90)
Potassium: 4.1 mEq/L (ref 3.5–5.1)
Sodium: 134 mEq/L — ABNORMAL LOW (ref 135–145)

## 2012-11-08 LAB — CBC
MCH: 27.1 pg (ref 26.0–34.0)
Platelets: 242 10*3/uL (ref 150–400)
RBC: 4.73 MIL/uL (ref 4.22–5.81)
WBC: 15 10*3/uL — ABNORMAL HIGH (ref 4.0–10.5)

## 2012-11-08 MED ORDER — HYDROMORPHONE HCL PF 1 MG/ML IJ SOLN
0.5000 mg | INTRAMUSCULAR | Status: DC | PRN
  Administered 2012-11-08 (×3): 0.5 mg via INTRAVENOUS
  Filled 2012-11-08 (×3): qty 1

## 2012-11-08 MED ORDER — COLCHICINE 0.6 MG PO TABS
0.6000 mg | ORAL_TABLET | Freq: Two times a day (BID) | ORAL | Status: DC
  Administered 2012-11-08 – 2012-11-13 (×11): 0.6 mg via ORAL
  Filled 2012-11-08 (×11): qty 1

## 2012-11-08 MED ORDER — PANTOPRAZOLE SODIUM 40 MG IV SOLR
40.0000 mg | Freq: Once | INTRAVENOUS | Status: AC
  Administered 2012-11-08: 40 mg via INTRAVENOUS
  Filled 2012-11-08: qty 40

## 2012-11-08 MED ORDER — PREDNISONE 20 MG PO TABS
20.0000 mg | ORAL_TABLET | Freq: Two times a day (BID) | ORAL | Status: DC
  Administered 2012-11-08 – 2012-11-13 (×11): 20 mg via ORAL
  Filled 2012-11-08 (×11): qty 1

## 2012-11-08 MED ORDER — ENOXAPARIN SODIUM 100 MG/ML ~~LOC~~ SOLN
90.0000 mg | SUBCUTANEOUS | Status: DC
  Administered 2012-11-08 – 2012-11-13 (×6): 90 mg via SUBCUTANEOUS
  Filled 2012-11-08 (×6): qty 1

## 2012-11-08 MED ORDER — METHYLPREDNISOLONE SODIUM SUCC 125 MG IJ SOLR
125.0000 mg | Freq: Once | INTRAMUSCULAR | Status: AC
  Administered 2012-11-08: 125 mg via INTRAVENOUS
  Filled 2012-11-08: qty 2

## 2012-11-08 MED ORDER — OXYCODONE HCL 5 MG PO TABS
5.0000 mg | ORAL_TABLET | ORAL | Status: DC | PRN
  Administered 2012-11-08: 5 mg via ORAL
  Administered 2012-11-08 – 2012-11-13 (×21): 10 mg via ORAL
  Filled 2012-11-08 (×4): qty 2
  Filled 2012-11-08: qty 1
  Filled 2012-11-08 (×6): qty 2
  Filled 2012-11-08: qty 1
  Filled 2012-11-08 (×2): qty 2
  Filled 2012-11-08: qty 1
  Filled 2012-11-08 (×9): qty 2

## 2012-11-08 MED ORDER — FLEET ENEMA 7-19 GM/118ML RE ENEM: 1.0000 | ENEMA | Freq: Once | RECTAL | Status: AC | PRN

## 2012-11-08 MED ORDER — SODIUM CHLORIDE 0.9 % IJ SOLN
3.0000 mL | Freq: Two times a day (BID) | INTRAMUSCULAR | Status: DC
  Administered 2012-11-09 – 2012-11-13 (×8): 3 mL via INTRAVENOUS
  Filled 2012-11-08: qty 3

## 2012-11-08 MED ORDER — ACETAMINOPHEN 325 MG PO TABS
650.0000 mg | ORAL_TABLET | ORAL | Status: DC | PRN
Start: 2012-11-08 — End: 2012-11-13

## 2012-11-08 MED ORDER — INSULIN ASPART 100 UNIT/ML ~~LOC~~ SOLN
0.0000 [IU] | Freq: Three times a day (TID) | SUBCUTANEOUS | Status: DC
  Administered 2012-11-08: 1 [IU] via SUBCUTANEOUS
  Administered 2012-11-09: 2 [IU] via SUBCUTANEOUS
  Administered 2012-11-09 – 2012-11-10 (×2): 1 [IU] via SUBCUTANEOUS
  Administered 2012-11-11: 2 [IU] via SUBCUTANEOUS

## 2012-11-08 MED ORDER — TRAZODONE HCL 50 MG PO TABS: 50.0000 mg | ORAL_TABLET | Freq: Every evening | ORAL | Status: DC | PRN

## 2012-11-08 MED ORDER — HYDROMORPHONE HCL PF 1 MG/ML IJ SOLN
0.5000 mg | INTRAMUSCULAR | Status: DC | PRN
  Administered 2012-11-08 – 2012-11-12 (×19): 0.5 mg via INTRAVENOUS
  Filled 2012-11-08 (×19): qty 1

## 2012-11-08 MED ORDER — POTASSIUM CHLORIDE IN NACL 20-0.9 MEQ/L-% IV SOLN
INTRAVENOUS | Status: DC
  Administered 2012-11-08 (×2): via INTRAVENOUS

## 2012-11-08 MED ORDER — SODIUM CHLORIDE 0.9 % IV SOLN: INTRAVENOUS | Status: DC

## 2012-11-08 MED ORDER — DOCUSATE SODIUM 100 MG PO CAPS
100.0000 mg | ORAL_CAPSULE | Freq: Two times a day (BID) | ORAL | Status: DC
  Administered 2012-11-08 – 2012-11-13 (×11): 100 mg via ORAL
  Filled 2012-11-08 (×11): qty 1

## 2012-11-08 MED ORDER — ONDANSETRON HCL 4 MG/2ML IJ SOLN: 4.0000 mg | INTRAMUSCULAR | Status: DC | PRN

## 2012-11-08 MED ORDER — PANTOPRAZOLE SODIUM 40 MG PO TBEC
40.0000 mg | DELAYED_RELEASE_TABLET | Freq: Two times a day (BID) | ORAL | Status: DC
  Administered 2012-11-08 – 2012-11-13 (×11): 40 mg via ORAL
  Filled 2012-11-08 (×11): qty 1

## 2012-11-08 MED ORDER — FEBUXOSTAT 40 MG PO TABS
80.0000 mg | ORAL_TABLET | Freq: Every day | ORAL | Status: DC
  Administered 2012-11-08 – 2012-11-13 (×6): 80 mg via ORAL
  Filled 2012-11-08 (×8): qty 2

## 2012-11-08 NOTE — Clinical Social Work Psychosocial (Signed)
Clinical Social Work Department BRIEF PSYCHOSOCIAL ASSESSMENT 11/08/2012  Patient:  [name], [name]     Account Number:  [id number]     Admit date:  11/07/2012  Clinical Social Worker:  Nancie Neas  Date/Time:  11/08/2012 04:00 PM  Referred by:  Physician  Date Referred:  11/08/2012 Referred for  SNF Placement   Other Referral:   Interview type:  Patient Other interview type:    PSYCHOSOCIAL DATA Living Status:  FAMILY Admitted from facility:   Level of care:   Primary support name:  Debbie Primary support relationship to patient:  PARENT Degree of support available:   supportive    CURRENT CONCERNS Current Concerns  Post-Acute Placement   Other Concerns:    SOCIAL WORK ASSESSMENT / PLAN CSW met with pt at bedside. Pt alert and oriented and reports he has been living with his mother since last d/c from hospital. Pt is well known to CSW. He states he has not ambulated in 2 months because of pain related to gout. Pt has instead been crawling but recently is in too much pain to even do that. His mother is unable to continue caring for pt at home and pt states he cannot return home. Pt evaluated by PT and recommended SNF to give pt opportunity to improve mobility with time. Pt has applied for disability/Medicaid. CSW provided number for examiner in [place] as pt missed an appointment yesterday. Provided SNF list. Pt aware most likely will be well out of this area. Spoke with supervisor who agrees to [employer].   Assessment/plan status:  Psychosocial Support/Ongoing Assessment of Needs Other assessment/ plan:   Information/referral to community resources:   SNF list    PATIENT'S/FAMILY'S RESPONSE TO PLAN OF CARE: Pt agreeable to SNF as he states he has no other options right now. CSW will initiate bed search and follow up with bed offers when availble.       Derenda Fennel, [place] [phone]

## 2012-11-08 NOTE — Clinical Social Work Placement (Signed)
Clinical Social Work Department CLINICAL SOCIAL WORK PLACEMENT NOTE 11/08/2012  Patient:  [name], [name]  Account Number:  [id number] Admit date:  11/07/2012  Clinical Social Worker:  Derenda Fennel, LCSW  Date/time:  11/08/2012 04:00 PM  Clinical Social Work is seeking post-discharge placement for this patient at the following level of care:   SKILLED NURSING   (*CSW will update this form in Epic as items are completed)   11/08/2012  Patient/family provided with [facility] Department of Clinical Social Work's list of facilities offering this level of care within the geographic area requested by the patient (or if unable, by the patient's family).  11/08/2012  Patient/family informed of their freedom to choose among providers that offer the needed level of care, that participate in Medicare, Medicaid or managed care program needed by the patient, have an available bed and are willing to accept the patient.  11/08/2012  Patient/family informed of [facility]' ownership interest in [facility], as well as of the fact that they are under no obligation to receive care at this facility.  PASARR submitted to EDS on  PASARR number received from EDS on   FL2 transmitted to all facilities in geographic area requested by pt/family on  11/08/2012 FL2 transmitted to all facilities within larger geographic area on 11/08/2012  Patient informed that his/her managed care company has contracts with or will negotiate with  certain facilities, including the following:     Patient/family informed of bed offers received:   Patient chooses bed at  Physician recommends and patient chooses bed at    Patient to be transferred to  on   Patient to be transferred to facility by   The following physician request were entered in Epic:   Additional Comments: Pt has previous pasarr number. Will be LOG.  Derenda Fennel, [place] [phone]

## 2012-11-08 NOTE — Progress Notes (Signed)
27 yo man with morbid obesity who has severe pain and swelling in both feet and both hands.  He also has pain in his shoulders.  Uric acid is high , appx 8, and WBC is high, appx 17000.  He has a history of ulcer disease, and so is unable to take Indocin.  He is currently unable to walk, so will need admission, Rx steroids and colchicine.

## 2012-11-08 NOTE — Consult Note (Signed)
Reason for Consult:Gout, poly joint pain Referring Physician: Sacha Radloff is an 27 y.o. male.  HPI: Patient has a long and complicated medical history including multiple poly joint pains and history of gout in the past and recently.  He has had multiple gout attacks in the past and now currently.  His left ankle has been very painful for about three months, more acute pain over the last couple of weeks.  He has not been on chronic therapy for gout, meaning allopurinol or Uloric, for many reasons.  He does not have a primary doctor taking care of his gout.    He has had GI bleeds and cannot take any NSAID, including Indocin.  Colchicine helps but he cannot afford it.  He has had results that were good with prednisone but cannot take that for long periods of time.  He has no trauma. He does not know of anyone in his family with gout.  I told him it was hereditary dependent.  He has developed pain in both wrists, both hands and fusiform swelling of the left ring finger.  He says he is just miserable at times with multiple joints hurting.  He is morbidly obese and he knows that does not help his situation.  He says he would just like to be able to walk a ways without having joint pains and limping.  Past Medical History  Diagnosis Date  . Gout   . Cellulitis   . Obesity   . Erosive esophagitis 09/01/2012    NSAID induced.  . Gastric ulcer with hemorrhage 09/02/2012    s/p bleeding control tx. Per Dr. Jena Gauss  . Acute blood loss anemia 09/02/2012    S/p 1 unit rbcs    Past Surgical History  Procedure Laterality Date  . Tonsillectomy    . Esophagogastroduodenoscopy Left 09/01/2012    Procedure: ESOPHAGOGASTRODUODENOSCOPY (EGD);  Surgeon: Corbin Ade, MD;  Location: [facility];  Service: Endoscopy;  Laterality: Left;    Family History  Problem Relation Age of Onset  . Heart failure Mother     Social History:  reports that he has quit smoking. His smoking use included  Cigarettes. He has a 3.5 pack-year smoking history. He has quit using smokeless tobacco. His smokeless tobacco use included Snuff. He reports that  drinks alcohol. He reports that he does not use illicit drugs.  Allergies:  Allergies  Allergen Reactions  . Asa (Aspirin) Other (See Comments)    Pt doesn't know reaction.  Marland Kitchen Penicillins Other (See Comments)    Pt doesn't know reaction.  . Sulfa Antibiotics Other (See Comments)    Pt doesn't know reaction.    Medications: I have reviewed the patient's current medications.  Results for orders placed during the hospital encounter of 11/07/12 (from the past 48 hour(s))  CBC WITH DIFFERENTIAL     Status: Abnormal   Collection Time    11/07/12 10:31 PM      Result Value Range   WBC 17.0 (*) 4.0 - 10.5 K/uL   RBC 4.43  4.22 - 5.81 MIL/uL   Hemoglobin 12.2 (*) 13.0 - 17.0 g/dL   HCT 40.9 (*) 81.1 - 91.4 %   MCV 82.8  78.0 - 100.0 fL   MCH 27.5  26.0 - 34.0 pg   MCHC 33.2  30.0 - 36.0 g/dL   RDW 78.2  95.6 - 21.3 %   Platelets 272  150 - 400 K/uL   Neutrophils Relative % 77  43 - 77 %   Neutro Abs 13.1 (*) 1.7 - 7.7 K/uL   Lymphocytes Relative 11 (*) 12 - 46 %   Lymphs Abs 1.8  0.7 - 4.0 K/uL   Monocytes Relative 12  3 - 12 %   Monocytes Absolute 2.1 (*) 0.1 - 1.0 K/uL   Eosinophils Relative 0  0 - 5 %   Eosinophils Absolute 0.0  0.0 - 0.7 K/uL   Basophils Relative 0  0 - 1 %   Basophils Absolute 0.0  0.0 - 0.1 K/uL  BASIC METABOLIC PANEL     Status: Abnormal   Collection Time    11/07/12 10:31 PM      Result Value Range   Sodium 134 (*) 135 - 145 mEq/L   Potassium 4.1  3.5 - 5.1 mEq/L   Chloride 93 (*) 96 - 112 mEq/L   CO2 30  19 - 32 mEq/L   Glucose, Bld 140 (*) 70 - 99 mg/dL   BUN 8  6 - 23 mg/dL   Creatinine, Ser 1.61  0.50 - 1.35 mg/dL   Calcium 9.4  8.4 - 09.6 mg/dL   GFR calc non Af Amer >90  >90 mL/min   GFR calc Af Amer >90  >90 mL/min   Comment:            The eGFR has been calculated     using the CKD EPI  equation.     This calculation has not been     validated in all clinical     situations.     eGFR's persistently     <90 mL/min signify     possible Chronic Kidney Disease.  URIC ACID     Status: Abnormal   Collection Time    11/07/12 10:31 PM      Result Value Range   Uric Acid, Serum 8.8 (*) 4.0 - 7.8 mg/dL  URINALYSIS, ROUTINE W REFLEX MICROSCOPIC     Status: Abnormal   Collection Time    11/07/12 11:34 PM      Result Value Range   Color, Urine AMBER (*) YELLOW   Comment: BIOCHEMICALS MAY BE AFFECTED BY COLOR   APPearance CLEAR  CLEAR   Specific Gravity, Urine 1.025  1.005 - 1.030   pH 6.5  5.0 - 8.0   Glucose, UA NEGATIVE  NEGATIVE mg/dL   Hgb urine dipstick TRACE (*) NEGATIVE   Bilirubin Urine SMALL (*) NEGATIVE   Ketones, ur 15 (*) NEGATIVE mg/dL   Protein, ur TRACE (*) NEGATIVE mg/dL   Urobilinogen, UA 1.0  0.0 - 1.0 mg/dL   Nitrite NEGATIVE  NEGATIVE   Leukocytes, UA NEGATIVE  NEGATIVE  URINE MICROSCOPIC-ADD ON     Status: None   Collection Time    11/07/12 11:34 PM      Result Value Range   Squamous Epithelial / LPF RARE  RARE   RBC / HPF 0-2  <3 RBC/hpf   Bacteria, UA RARE  RARE  CBC     Status: Abnormal   Collection Time    11/08/12  5:26 AM      Result Value Range   WBC 15.0 (*) 4.0 - 10.5 K/uL   RBC 4.73  4.22 - 5.81 MIL/uL   Hemoglobin 12.8 (*) 13.0 - 17.0 g/dL   HCT 04.5  40.9 - 81.1 %   MCV 83.7  78.0 - 100.0 fL   MCH 27.1  26.0 - 34.0 pg   MCHC 32.3  30.0 -  36.0 g/dL   RDW 41.3  24.4 - 01.0 %   Platelets 242  150 - 400 K/uL  COMPREHENSIVE METABOLIC PANEL     Status: Abnormal   Collection Time    11/08/12  5:26 AM      Result Value Range   Sodium 134 (*) 135 - 145 mEq/L   Potassium 4.1  3.5 - 5.1 mEq/L   Chloride 95 (*) 96 - 112 mEq/L   CO2 29  19 - 32 mEq/L   Glucose, Bld 251 (*) 70 - 99 mg/dL   BUN 8  6 - 23 mg/dL   Creatinine, Ser 2.72  0.50 - 1.35 mg/dL   Calcium 9.4  8.4 - 53.6 mg/dL   Total Protein 7.5  6.0 - 8.3 g/dL   Albumin 2.5  (*) 3.5 - 5.2 g/dL   AST 13  0 - 37 U/L   ALT 21  0 - 53 U/L   Alkaline Phosphatase 91  39 - 117 U/L   Total Bilirubin 1.0  0.3 - 1.2 mg/dL   GFR calc non Af Amer >90  >90 mL/min   GFR calc Af Amer >90  >90 mL/min   Comment:            The eGFR has been calculated     using the CKD EPI equation.     This calculation has not been     validated in all clinical     situations.     eGFR's persistently     <90 mL/min signify     possible Chronic Kidney Disease.    Dg Chest Portable 1 View  11/07/2012   *RADIOLOGY REPORT*  Clinical Data: Pain; history of gout  PORTABLE CHEST - 1 VIEW  Comparison: None.  Findings: Lungs clear.  Heart size and pulmonary vascularity are normal.  No adenopathy.  No bone lesions.  No pneumothorax.  IMPRESSION: No abnormality noted.   Original Report Authenticated By: Bretta Bang, M.D.    Review of Systems  Gastrointestinal:       History of GI bleeds, hematemesis, erosive esophagitis, heartburn, nausea  Musculoskeletal: Positive for joint pain (multiple joint pains, left ankle most, left ring finger, both hands, both wrists, right knee with history of gout).  Endo/Heme/Allergies:       Morbid obesity, history of anemia   Blood pressure 125/76, pulse 103, temperature 97.8 F (36.6 C), temperature source Oral, resp. rate 20, height 5\' 7"  (1.702 m), weight 182.3 kg (401 lb 14.4 oz), SpO2 97.00%. Physical Exam  Constitutional: He is oriented to person, place, and time. He appears well-developed and well-nourished.  Morbidly obese  HENT:  Head: Normocephalic and atraumatic.  Eyes: Conjunctivae and EOM are normal. Pupils are equal, round, and reactive to light.  Neck: Normal range of motion. Neck supple.  Cardiovascular: Normal rate, regular rhythm and intact distal pulses.   Respiratory: Effort normal and breath sounds normal.  GI: Soft. Bowel sounds are normal.  Musculoskeletal:  His left ankle is very diffusely tender with no area of effusion or  redness.  His left foot and toes are also diffusely tender with slight edema.  ROM of the ankle on the left is limited.  His left ring finger has fusiform swelling and limited motion.  Both wrists are tender with no effusion.  Right knee has lateral ecchymosis and tender but no effusion.  He has multiple joint pains, no redness, no effusion.  He says he cannot stand or get out of bed.  Neurological: He is alert and oriented to person, place, and time. He has normal reflexes.  Skin: Skin is warm and dry.  Psychiatric: He has a normal mood and affect. His behavior is normal. Judgment normal.    Assessment/Plan: Acute and chronic gout, affecting multiple joints. Morbid obesity.  He has no joint that has an effusion.  So I cannot  aspirate and send to lab to check for uric acid crystals. If he should develop such, I can aspirate.  I will begin on Uloric 80 today.  I can follow for gout in my office after discharge.  The Uloric will help begin to lower the uric acid levels quicker than allopurinol and can be given in the acute setting like this.  His renal function is good.  [place]uka Festa 11/08/2012, 3:49 PM

## 2012-11-08 NOTE — Evaluation (Signed)
Physical Therapy Evaluation Patient Details Name: [name] MRN: [id number] DOB: 09/02/1985 Today's Date: 11/08/2012 Time:  -     PT Assessment / Plan / Recommendation History of Present Illness  Pt is readmitted with recurring c/o severe genralized joint pain which has caused total immobility.   Pt was seen for evaluation.  He is well known to this therapist from previous admissions for the same c/o.  He would not allow any motion in any body part except for the right knee, left shoulder and elbow.  The only warmth I could find was at the left ankle.  I could see no erythema.  The pt screams out in pain with a very gentle touch to all body parts.  I asked him to demonstrate how he feeds himself and he had no difficulty bringing his left hand to mouth.  However, when I tried to have him flex/extend that same elbow it suddenly became extremely difficult and effortful.  In my opinion, his expression of pain far outweighs what I see and feel.  I question if he is getting some secondary gains from immobility.  The concern is, however, he will become bedbound over time and at his age this would be heartbreaking.  I am recommending SNF at d/c to hopefully help him overcome these problems.  PT Assessment  Patient needs continued PT services    Follow Up Recommendations  SNF    Does the patient have the potential to tolerate intense rehabilitation    no  Barriers to Discharge Decreased caregiver support      Equipment Recommendations  Rolling walker with 5" wheels    Recommendations for Other Services     Frequency Min 3X/week (only if pt becomes cooperative)    Precautions / Restrictions Precautions Precautions: Fall Restrictions Weight Bearing Restrictions: No   Pertinent Vitals/Pain       Mobility  Bed Mobility Details for Bed Mobility Assistance: pt refuses...states that he cannot move and that noone understands him    Exercises     PT Diagnosis: Acute pain;Difficulty  walking  PT Problem List: Decreased range of motion;Decreased strength;Decreased activity tolerance;Decreased mobility;Obesity;Pain PT Treatment Interventions: DME instruction;Functional mobility training;Therapeutic exercise;Patient/family education     PT Goals(Current goals can be found in the care plan section) Acute Rehab PT Goals Patient Stated Goal: none stated PT Goal Formulation: With patient Time For Goal Achievement: 11/22/12 Potential to Achieve Goals: Poor  Visit Information  Last PT Received On: 11/08/12 History of Present Illness: Pt is readmitted with recurring c/o severe genralized joint pain which has caused total immobility.       Prior Functioning  Home Living Family/patient expects to be discharged to:: Private residence Additional Comments: Pt states that his mother will not allow him to return home "if I am in the same situation as I am now".  He feels that he will probably end up "homeless". Prior Function Level of Independence: Needs assistance Gait / Transfers Assistance Needed: pt has been unable to walk...crawls on the floor for mobility...he has refused a w/c in the past as it would "not fit inside of my house" ADL's / Homemaking Assistance Needed: pt is able to feed himself with the left hand, but otherwise would need total assist Communication / Swallowing Assistance Needed: no problems Communication Communication: No difficulties    Cognition  Cognition Arousal/Alertness: Awake/alert Behavior During Therapy: WFL for tasks assessed/performed Overall Cognitive Status: Within Functional Limits for tasks assessed    Extremity/Trunk Assessment Upper Extremity  Assessment Upper Extremity Assessment: RUE deficits/detail;LUE deficits/detail RUE Deficits / Details: pt states that he cannot move any part of his RUE and he yells out if his arm is touched.  There was no warmth or erythema which one would see with acute onset of inflammatory arthritis LUE  Deficits / Details: pt feeds himself with his left hand by leaving his shoulder relaxed and flexing/extending his left elbow...Marland Kitchenhowever, when I asked him to work with me on elbow flexion and extension, the movement instantly became extremely difficult for him to do Lower Extremity Assessment Lower Extremity Assessment: RLE deficits/detail;LLE deficits/detail RLE Deficits / Details: pt screams in pain with extremely gentle touch to any part of his foot...there is no warmth or erythema evident.  He would allow me to flex and extend the knee but he cries in pain.  I could not feel any arthritis or feel any warmth in the leg LLE Deficits / Details: there was warmth palpated at the left ankle.Marland KitchenMarland Kitchenpt would not allow any movement or intervention   Balance Balance Balance Assessed: No  End of Session PT - End of Session Activity Tolerance: Patient limited by pain Patient left: in bed;with call bell/phone within reach  GP     Myrlene Broker L 11/08/2012, 12:24 PM

## 2012-11-08 NOTE — Progress Notes (Signed)
Inpatient Diabetes Program Recommendations  AACE/ADA: New Consensus Statement on Inpatient Glycemic Control (2013)  Target Ranges:  Prepandial:   less than 140 mg/dL      Peak postprandial:   less than 180 mg/dL (1-2 hours)      Critically ill patients:  140 - 180 mg/dL   Results for VALLEN, CALABRESE (MRN [id number]) as of 11/08/2012 08:41  Ref. Range 11/07/2012 22:31 11/08/2012 05:26  Glucose Latest Range: 70-99 mg/dL 409 (H) 811 (H)    Inpatient Diabetes Program Recommendations Correction (SSI): Please order CBGs with Novolog correction scale while inpatient and on steroids. HgbA1C: Please order an A1C to determine glycemic control over the past 2-3 months. Diet: May want to add Carb Modified to current heart healthy diet.  Note: Patient does not have a documented history of diabetes.  Initial lab glucose on 7/24 was 140 mg/dl at 91:47 and fasting lab glucose this morning was 251 mg/dl at 8:29 am.  Note that Solumedrol 125 mg IV was given in ED on 7/25 @ 00:59 and patient has Prednisone 20 mg BID ordered for gout flare up.  Steroids are likely causing hyperglycemia but need to evaluate glycemic control over the past few months with A1C.  If A1C is greater than 6.5%, per ADA the criteria for diabetes diagnosis would be met. If patient is diagnosed with diabetes, he will education for new onset diabetes.  While inpatient on steroids, please order an A1C, CBGs with Novolog correction ACHS, and may want to add Carb Modified to current heart healthy diet.  Will continue to follow.  Thanks, Orlando Penner, RN, MSN, CCRN Diabetes Coordinator Inpatient Diabetes Program [phone]

## 2012-11-08 NOTE — Progress Notes (Signed)
[facility] PROGRESS NOTE  [name] VQQ:[id number] DOB: 24-Aug-1985 DOA: 11/07/2012 PCP: No primary provider on file.  Assessment/Plan: 1. 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 Hilda Lias. No effusion at this point and therefore no arthrocentesis. Although the patient has never had arthrocentesis, he reports he has had gout for at least 5 years which he has only some diminished with NSAIDs. His left foot pain and left wrist pain is been going on for months now. He does report increased pain in his right shoulder, elbow and wrist. Review of his record is notable for multiple x-rays over the years especially of right and left ankles 2004, 2005, 2006, 2008. These x-rays revealed diffuse edema and at times joint effusions. He has had multiple uric acid studies done over the years which have always been elevated. His exam is complicated by morbid obesity, limited effort and is inconsistent with joint mobility based on observation versus examination. This has been noted by physical therapy and by orthopedics as well. Additionally his pain is out of proportion to examination. Hopefully we will be able to obtain arthrocentesis to confirm diagnosis, however the chronicity of this acute flare and general view of the record is highly suggestive of gout. His fever and leukocytosis is most consistent with gout as he has no signs or symptoms to suggest infection. Lack of effusion and polyarticular nature would argue against this as well. ESR is been mildly elevated in the past. Will repeat this in the morning. I discussed this with rheumatology at [facility] in [place] who recommended the above treatment. No other studies recommended at this time. 2. Deconditioning: Difficult to assess but he consistently reports poor mobility in skilled nursing facility placement is being pursued. Given his noncompliance (he declined to be set up with a  primary care physician in last hospitalization, and very limited financial resources preventing him from obtaining medication even less than $10 price) he would best be served by placement if possible. 3. 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 Given limited treatment options, risk of steroids is outweighed by potential benefit at this point. 4. Steroid-induced hyperglycemia: Start sliding scale insulin. Obtain hemoglobin A1c. 5. Morbid obesity   Carb modified diet, sliding scale insulin, check hemoglobin A1c  Code Status: Full code DVT prophylaxis: Lovenox Family Communication: None present Disposition Plan: Pending further evaluation, likely skilled nursing facility  Brendia Sacks, MD  [facility]  Pager [phone] If 7PM-7AM, please contact night-coverage at [email or web address], password [facility] 11/08/2012, 4:31 PM  LOS: 1 day   Clinical Summary: 27 year old man with history of gout greater than 5 years and recent peptic ulcer bleed who presents the emergency department with increasing polyarticular joint pain and edema. Despite multiple treatments including steroids his condition had not improved, he reported he could not walk and was admitted for further evaluation of gout. He was noted to be febrile and to have leukocytosis.  Consultants:  Orthopedics  Procedures:    HPI/Subjective: Patient reports continued joint pain, especially left ankle, left breast, right wrist, right elbow, right shoulder. Also pain right foot. The pain in his left foot has been present for months.  Objective: Filed Vitals:   11/08/12 0214 11/08/12 0500 11/08/12 0559 11/08/12 1410  BP: 136/80  114/71 125/76  Pulse: 123  105 103  Temp: 98.5 F (36.9 C)  98.1 F (36.7 C) 97.8 F (36.6 C)  TempSrc: Oral  Oral Oral  Resp: 20  22  20  Height:      Weight:  182.3 kg (401 lb 14.4 oz)    SpO2: 94%  94% 97%   No intake or output data in the 24 hours ending  11/08/12 1631   Filed Weights   11/07/12 2028 11/08/12 0500  Weight: 181.439 kg (400 lb) 182.3 kg (401 lb 14.4 oz)    Exam:   Febrile on admission. Vitals stable.  On entrance to the room the patient appeared calm and comfortable and was eating lunch.  Cardiovascular: Regular rate and rhythm. No murmur, rub, gallop.  Respiratory: Clear to auscultation bilaterally. No wheezes, rales, rhonchi. Normal respiratory effort.  Abdomen: Obese.  Skin: No erythema upper or lower extremities. No significant rash seen.  Musculoskeletal: Morbid obesity makes exam difficult, however no definite joint effusions are noted in the knees, ankles, wrists or elbows. He has pain out of proportion to examination findings, crying out in pain with the least palpation of any toe on the left or right foot, the slightest touch of the left wrist, right wrist, right fingers, right elbow, right shoulder. Exquisite tenderness with the gentlest palpation of the subcutaneous tissue of these areas is noted. He did appear to have spontaneous movement of his left and right arm prior to examination and does not appear to give full effort.  Data Reviewed:  Basic metabolic panel unremarkable. Uric acid was elevated at 8.8. Review of his record demonstrates consistent elevation of uric acid dating back to 2008  Leukocytosis has decreased, now 15  Glucose elevated  251  Urinalysis unremarkable  Pending studies:   None  Scheduled Meds: . colchicine  0.6 mg Oral BID  . docusate sodium  100 mg Oral BID  . enoxaparin (LOVENOX) injection  90 mg Subcutaneous Q24H  . febuxostat  80 mg Oral Daily  . pantoprazole  40 mg Oral BID AC  . predniSONE  20 mg Oral BID WC  . sodium chloride  3 mL Intravenous Q12H   Continuous Infusions: . 0.9 % NaCl with KCl 20 mEq / L 125 mL/hr at 11/08/12 1219    Active Problems:   Acute gouty arthritis   Morbid obesity   Cellulitis   Tinea pedis   Polyarthritis   Time spent 35  minutes

## 2012-11-08 NOTE — ED Provider Notes (Signed)
Medical screening examination/treatment/procedure(s) were conducted as a shared visit with non-physician practitioner(s) and myself.  I personally evaluated the patient during the encounter 27 yo man with morbid obesity who has severe pain and swelling in both feet and both hands. He also has pain in his shoulders. Uric acid is high , appx 8, and WBC is high, appx 17000. He has a history of ulcer disease, and so is unable to take Indocin. He is currently unable to walk, so will need admission, Rx steroids and colchicine   Carleene Cooper III, MD 11/08/12 [phone]

## 2012-11-08 NOTE — Progress Notes (Signed)
Utilization Review Complete  

## 2012-11-08 NOTE — H&P (Signed)
[facility] History and Physical  [name]  WUJ:[id number]  DOB: [birth year]   DOA: 11/08/2012   PCP:   No primary provider on file.   Chief Complaint:  Swelling of all joints the past 2 days  HPI: [name] is a 27 y.o. male.   Morbidly obese young Caucasian gentleman; Well-known to the hospitalist with recurrent admissions for cellulitis and also gout affecting principally the left knee and left foot. Patient suffered a massive GI bleed in the past as a result of the use of NSAIDS to treat gout. He has not responded well to colchicine as an inpatient, and is not able to afford colchicine for maintenance as an outpatient. He is responded some to steroids and high-dose PPIs, and was discharged 3 weeks ago with a steroid taper.   He reports that he has not improved significantly since discharge, has been unable to walk, gets around by crawling on all 4s. Now for the past 2 days he's had a rapid onset of progressive swelling of essentially all the joints of his extremities, and has developed severe lower back pain, and is unable to get around at all.  He was brought to the emergency room by EMS found to be febrile and have a leukocytosis, with multiple inflamed joints. He was given intravenous Solu-Medrol and the hospitalist service called for admission. Hours later he continues to complain of pain in the joints.  He does not know if there's been any change in his weight since discharge.    Rewiew of Systems:   All systems negative except as marked bold or noted in the HPI;  Constitutional:    malaise, fever and chills. ;  Eyes:   eye pain, redness and discharge. ;  ENMT:   ear pain, hoarseness, nasal congestion, sinus pressure and transient sore throat. sunday ;  Cardiovascular:    chest pain, palpitations, diaphoresis, dyspnea and peripheral edema.  Respiratory:   cough, hemoptysis, wheezing and stridor. ;  Gastrointestinal:  nausea, vomiting, diarrhea, constipation,  abdominal pain, melena, blood in stool, hematemesis, jaundice and rectal bleeding. unusual weight loss..   Genitourinary:    frequency, dysuria, incontinence,flank pain and hematuria; Musculoskeletal:   back pain and neck pain.  swelling and trauma.;  Skin: .  pruritus, rash, abrasions, bruising and skin lesion.; ulcerations Neuro:    headache, lightheadedness and neck stiffness.  weakness, altered level of consciousness, altered mental status, extremity weakness, burning feet, involuntary movement, seizure and syncope.  Psych:    anxiety, depression, insomnia, tearfulness, panic attacks, hallucinations, paranoia, suicidal or homicidal ideation    Past Medical History  Diagnosis Date  . Gout   . Cellulitis   . Obesity   . Erosive esophagitis 09/01/2012    NSAID induced.  . Gastric ulcer with hemorrhage 09/02/2012    s/p bleeding control tx. Per Dr. Jena Gauss  . Acute blood loss anemia 09/02/2012    S/p 1 unit rbcs    Past Surgical History  Procedure Laterality Date  . Tonsillectomy    . Esophagogastroduodenoscopy Left 09/01/2012    Procedure: ESOPHAGOGASTRODUODENOSCOPY (EGD);  Surgeon: Corbin Ade, MD;  Location: [facility];  Service: Endoscopy;  Laterality: Left;    Medications:  HOME MEDS: Prior to Admission medications   Not on File     Allergies:  Allergies  Allergen Reactions  . Asa (Aspirin) Other (See Comments)    Pt doesn't know reaction.  Marland Kitchen Penicillins Other (See Comments)    Pt doesn't know reaction.  Marland Kitchen  Sulfa Antibiotics Other (See Comments)    Pt doesn't know reaction.    Social History:   reports that he has quit smoking. His smoking use included Cigarettes. He has a 3.5 pack-year smoking history. He has quit using smokeless tobacco. His smokeless tobacco use included Snuff. He reports that  drinks alcohol. He reports that he does not use illicit drugs.  Family History: Family History  Problem Relation Age of Onset  . Heart failure Mother       Physical Exam: Filed Vitals:   11/08/12 0008 11/08/12 0055 11/08/12 0122 11/08/12 0214  BP: 121/70  114/74 136/80  Pulse: 142  133 123  Temp:  101 F (38.3 C)  98.5 F (36.9 C)  TempSrc:  Oral  Oral  Resp: 22  22 20   Height:      Weight:      SpO2: 90%  100% 94%   Blood pressure 136/80, pulse 123, temperature 98.5 F (36.9 C), temperature source Oral, resp. rate 20, height 5\' 7"  (1.702 m), weight 181.439 kg (400 lb), SpO2 94.00%. Body mass index is 62.63 kg/(m^2).   GEN:  Pleasant morbidly obese young Caucasian gentleman lying bed in acute painful distress; cooperative with exam PSYCH:  alert and oriented x4;  anxious nor depressed; affect is appropriate. HEENT: Mucous membranes pink and anicteric; PERRLA; EOM intact; no cervical lymphadenopathy nor thyromegaly or carotid bruit; no JVD; Breasts:: Not examined CHEST WALL: No tenderness CHEST: Normal respiration, clear to auscultation bilaterally HEART: Regular rate and rhythm; no murmurs rubs or gallops BACK: No kyphosis no scoliosis; no CVA tenderness ABDOMEN: Obese, soft non-tender; no masses, no organomegaly, normal abdominal bowel sounds; no pannus; no intertriginous candida. Rectal Exam: Not done  EXTREMITIES: Swollen tender right shoulder; Swelling of the fingers of both hands; with marked soft tissue is swelling of the left ring finger Swollen tender right wrist  Mild right knee swelling and tenderness  Swollen tender left ankle dorsum of left foot and toes  Swollen tender right dorsum of foot and toes   Genitalia: not examined PULSES: 2+ and symmetric SKIN: Tinea corporis rash both feet. Left popliteal flexure,  left knee CNS: Cranial nerves 2-12 grossly intact no focal lateralizing neurologic deficit   Labs on Admission:  Basic Metabolic Panel:  Recent Labs Lab 11/07/12 2231  NA 134*  K 4.1  CL 93*  CO2 30  GLUCOSE 140*  BUN 8  CREATININE 0.59  CALCIUM 9.4   Liver Function Tests: No results  found for this basename: AST, ALT, ALKPHOS, BILITOT, PROT, ALBUMIN,  in the last 168 hours No results found for this basename: LIPASE, AMYLASE,  in the last 168 hours No results found for this basename: AMMONIA,  in the last 168 hours CBC:  Recent Labs Lab 11/07/12 2231  WBC 17.0*  NEUTROABS 13.1*  HGB 12.2*  HCT 36.7*  MCV 82.8  PLT 272   Cardiac Enzymes: No results found for this basename: CKTOTAL, CKMB, CKMBINDEX, TROPONINI,  in the last 168 hours BNP: No components found with this basename: POCBNP,  D-dimer: No components found with this basename: D-DIMER,  CBG: No results found for this basename: GLUCAP,  in the last 168 hours  Radiological Exams on Admission: Dg Chest Portable 1 View  11/07/2012   *RADIOLOGY REPORT*  Clinical Data: Pain; history of gout  PORTABLE CHEST - 1 VIEW  Comparison: None.  Findings: Lungs clear.  Heart size and pulmonary vascularity are normal.  No adenopathy.  No bone lesions.  No pneumothorax.  IMPRESSION: No abnormality noted.   Original Report Authenticated By: Bretta Bang, M.D.    Assessment/Plan   Active Problems:   Acute probably gouty Polyarthritis   Morbid obesity   Tinea pedis; tinea corporis     PLAN: Continue steroids and high-dose proton pump inhibitor Try to secure a rheumatology consult Social work consult likely benefit from nursing home placement for treatment since he has no health insurance, and this seems to be mainly cause of recurrent admissions and noncompliance   Other plans as per orders.  Code Status: Full code Family Communication:  Plans discuss with patient Disposition Plan: Pending    Dim Meisinger Nocturnist [facility] Pager [phone]   11/08/2012, 2:36 AM

## 2012-11-09 LAB — CBC
MCH: 26.8 pg (ref 26.0–34.0)
MCHC: 32 g/dL (ref 30.0–36.0)
MCV: 83.8 fL (ref 78.0–100.0)
Platelets: 295 10*3/uL (ref 150–400)
RBC: 4.25 MIL/uL (ref 4.22–5.81)

## 2012-11-09 LAB — BASIC METABOLIC PANEL
CO2: 26 mEq/L (ref 19–32)
Calcium: 9.3 mg/dL (ref 8.4–10.5)
Creatinine, Ser: 0.5 mg/dL (ref 0.50–1.35)
Glucose, Bld: 190 mg/dL — ABNORMAL HIGH (ref 70–99)

## 2012-11-09 LAB — GLUCOSE, CAPILLARY
Glucose-Capillary: 159 mg/dL — ABNORMAL HIGH (ref 70–99)
Glucose-Capillary: 124 mg/dL — ABNORMAL HIGH (ref 70–99)

## 2012-11-09 LAB — HEMOGLOBIN A1C: Hgb A1c MFr Bld: 5.5 % (ref <5.7)

## 2012-11-09 NOTE — Progress Notes (Signed)
Subjective: I still hurt   Objective: Vital signs in last 24 hours: Temp:  [97.6 F (36.4 C)-97.8 F (36.6 C)] 97.6 F (36.4 C) (07/26 0416) Pulse Rate:  [78-103] 78 (07/26 0836) Resp:  [20] 20 (07/26 0416) BP: (104-125)/(60-77) 104/60 mmHg (07/26 0416) SpO2:  [94 %-98 %] 94 % (07/26 0836) Weight:  [178.71 kg (393 lb 15.8 oz)-178.717 kg (394 lb)] 178.71 kg (393 lb 15.8 oz) (07/26 0439)  Intake/Output from previous day: 07/25 0701 - 07/26 0700 In: 1120 [P.O.:1120] Out: 1800 [Urine:1800] Intake/Output this shift:     Recent Labs  11/07/12 2231 11/08/12 0526 11/09/12 0612  HGB 12.2* 12.8* 11.4*    Recent Labs  11/08/12 0526 11/09/12 0612  WBC 15.0* 19.5*  RBC 4.73 4.25  HCT 39.6 35.6*  PLT 242 295    Recent Labs  11/08/12 0526 11/09/12 0612  NA 134* 137  K 4.1 4.1  CL 95* 99  CO2 29 26  BUN 8 10  CREATININE 0.59 0.50  GLUCOSE 251* 190*  CALCIUM 9.4 9.3   No results found for this basename: LABPT, INR,  in the last 72 hours  Neurologically intact Neurovascular intact Sensation intact distally Intact pulses distally Dorsiflexion/Plantar flexion intact  He has begun on his Uloric.  He still has joint pains.  He has no particular joint with effusion to aspirate at this time. Assessment/Plan: Acute and chronic gout.  Continue the Uloric.   [name] 11/09/2012, 8:49 AM

## 2012-11-09 NOTE — Progress Notes (Signed)
[facility] PROGRESS NOTE  [name] ZOX:[id number] DOB: February 12, 1986 DOA: 11/07/2012 PCP: No primary provider on file.  Assessment/Plan: 1. 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 Remains afebrile and range of movement has improved. Appreciate orthopedics consultation, unfortunately no arthrocentesis possible this point secondary to lack of definite effusion. Previously outlined long history of similar symptoms and radiographic/laboratory findings support the long presumed diagnosis of gout. There is no evidence of infection. No leukopenia or elevated transaminases. Continue current therapy. Physical therapy. 2. Deconditioning: This is quite difficult to assess given variable mobility with examination. When patient has been surprised he has been noted to be moving his upper extremities better than on focused examination. However there is no question that he is suffering from polyarticular arthritis. Rehabilitation would be the best option for him. 3. 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 Monitor for bleeding. Steroid-induced hyperglycemia: Continue sliding scale insulin. This appears stable. Followup hemoglobin A1c. 4. Morbid obesity   Continue current treatment for gout, physical therapy  Code Status: Full code DVT prophylaxis: Lovenox Family Communication: None present Disposition Plan: Pending further evaluation, likely skilled nursing facility  Brendia Sacks, MD  [facility]  Pager [phone] If 7PM-7AM, please contact night-coverage at [email or web address], password [facility] 11/09/2012, 2:18 PM  LOS: 2 days   Clinical Summary: 27 year old man with history of gout greater than 5 years and recent peptic ulcer bleed who presents the emergency department with increasing polyarticular joint pain and edema. Despite multiple treatments including steroids his  condition had not improved, he reported he could not walk and was admitted for further evaluation of gout. He was noted to be febrile and to have leukocytosis.  Consultants:  Orthopedics  Procedures:    HPI/Subjective: Discussed with RN--no new issues, however the patient was noted to move both arms yesterday without difficulty when he was caught by surprise. Pain appears to be out of proportion to examination findings. Evaluated by physical therapy yesterday, evaluation suggested a proportion to findings and inconsistent effort.  Patient feels perhaps a little bit better. His appetite is poor because he does not like the food. No new complaints.  Objective: Filed Vitals:   11/09/12 0416 11/09/12 0439 11/09/12 0836 11/09/12 1355  BP: 104/60   105/66  Pulse: 86  78 71  Temp: 97.6 F (36.4 C)   97.9 F (36.6 C)  TempSrc: Oral   Oral  Resp: 20   20  Height:      Weight:  178.71 kg (393 lb 15.8 oz)    SpO2: 97%  94% 98%    Intake/Output Summary (Last 24 hours) at 11/09/12 1418 Last data filed at 11/09/12 0500  Gross per 24 hour  Intake    480 ml  Output   1800 ml  Net  -1320 ml     Filed Weights   11/08/12 0500 11/09/12 0230 11/09/12 0439  Weight: 182.3 kg (401 lb 14.4 oz) 178.717 kg (394 lb) 178.71 kg (393 lb 15.8 oz)    Exam:   Afebrile now greater than 24 hours. Vitals are stable.  General: Appears calm and more comfortable today.   Musculoskeletal: Moves left arm very well without any apparent pain, manipulates objects with left hand without apparent pain. Even the slightest touch of his fingers, wrist or elbow causes him to grit his teeth, bite on the blanket and cry out in pain. Although he was not willing to move his right  arm with passive manipulation he is able to maintain tone and strength in the upper and lower arm without difficulty. When I let go of his raised right arm he was able to slowly bring it down without apparent pain. Full range of movement in the  right arm is suspected although exam somewhat limited. Bilateral lower extremities move without difficulty. The knees ankles and feet appear unremarkable except for edema of the feet and ankles. There is no erythema of the skin of the arms or legs. He can reports exquisite pain with colitis touch of his toes the skin at the dorsum of his foot or his arms.  Cardiovascular: Regular rate and rhythm. No murmur, rub, gallop.  Respiratory: Clear to auscultation bilaterally. No wheezes, rales, rhonchi. Normal respiratory effort.  Psychiatric: Appears depressed.  Data Reviewed:  Basic metabolic panel unremarkable. Blood sugars stable.  White blood cell count to increase today 19.5. Hemoglobin stable 11.4.  ESR 90  Pending studies:   Hemoglobin A1c  Scheduled Meds: . colchicine  0.6 mg Oral BID  . docusate sodium  100 mg Oral BID  . enoxaparin (LOVENOX) injection  90 mg Subcutaneous Q24H  . febuxostat  80 mg Oral Daily  . insulin aspart  0-9 Units Subcutaneous TID WC  . pantoprazole  40 mg Oral BID AC  . predniSONE  20 mg Oral BID WC  . sodium chloride  3 mL Intravenous Q12H   Continuous Infusions:    Active Problems:   Acute gouty arthritis   Morbid obesity   Cellulitis   Tinea pedis   Polyarthritis   Time spent 20 minutes

## 2012-11-10 LAB — GLUCOSE, CAPILLARY: Glucose-Capillary: 111 mg/dL — ABNORMAL HIGH (ref 70–99)

## 2012-11-10 LAB — CBC
HCT: 39.4 % (ref 39.0–52.0)
Hemoglobin: 12.4 g/dL — ABNORMAL LOW (ref 13.0–17.0)
MCH: 26.8 pg (ref 26.0–34.0)
MCHC: 31.5 g/dL (ref 30.0–36.0)

## 2012-11-10 LAB — BASIC METABOLIC PANEL
BUN: 15 mg/dL (ref 6–23)
Calcium: 9.3 mg/dL (ref 8.4–10.5)
GFR calc non Af Amer: >90 mL/min (ref >90)
Glucose, Bld: 94 mg/dL (ref 70–99)
Potassium: 3.5 mEq/L (ref 3.5–5.1)

## 2012-11-10 NOTE — Progress Notes (Signed)
Nurse tech entered the room and found patient on his knees at the side of the bed.  Called nurse to room.  Patient stated that he asked stepfather to give him the stool on wheels that is found in patient rooms.  The patient stated that the stepfather gave him the stool and then the patient moved from the bed to the stool.  The patient stated that he rolled himself into the bathroom, moved to toilet seat, sat on the toilet seat and voided.  After voiding, the patient stated he asked his stepfather to bring him pillows to the bathroom and his stepfather obeyed.  The patient stated that he then slid himself off the toilet, onto the pillows, and crawled back to the bed.  The patient stated he tried to pull himself up on the bed but could not.  At that point, the nurse tech rounded on patient, found patient on floor, and called for assistance.  Five staff members arrived immediately.      While one nurse tech went to get lift equipment, patient stated that he could not wait any longer and patient demanded that we assist him back to bed immediately.  Three nurses and two nurse techs lifted him back into the bed. Patient was reassessed, and no physical injury was found.  Patient stated he was not in any additional pain.  Patient appeared emotional after getting back into the bed.  Patient did receive pain medicine after getting into the bed.    After the fall, nurse tech was in the room and patient stated to stepfather, "Don't tell them anything."   Pillows and blankets were found in the floor of the bathroom.   Ellene Route 11/10/2012

## 2012-11-10 NOTE — Progress Notes (Signed)
[facility] PROGRESS NOTE  [name] ZOX:[id number] DOB: [birth year] DOA: 11/07/2012 PCP: No primary provider on file. none   Assessment/Plan: 1. 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 Fever resolved, leukocytosis decreased. Range of movement and pain has improved. Continue current therapy. 2. Deconditioning: Physical therapy, patient needs motivation to participate. 3. 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 Monitor for bleeding.  4. Steroid-induced hyperglycemia: Continue sliding scale insulin. Well controlled. Hemoglobin A1c normal. 5. Morbid obesity   Continue current treatment for gout, physical therapy  Change to regular diet  Code Status: Full code DVT prophylaxis: Lovenox Family Communication: None present Disposition Plan: Pending further evaluation, likely skilled nursing facility  Brendia Sacks, MD  [facility]  Pager [phone] If 7PM-7AM, please contact night-coverage at [email or web address], password [facility] 11/10/2012, 12:53 PM  LOS: 3 days   Clinical Summary: 27 year old man with history of gout greater than 5 years and recent peptic ulcer bleed who presents the emergency department with increasing polyarticular joint pain and edema. Despite multiple treatments including steroids his condition had not improved, he reported he could not walk and was admitted for further evaluation of gout. He was noted to be febrile and to have leukocytosis.  Consultants:  Orthopedics  Procedures:    HPI/Subjective: No new issues overnight. Eating better. Overall pain is improved and ROM improved in extremities.  Objective: Filed Vitals:   11/09/12 0836 11/09/12 1355 11/09/12 2153 11/10/12 0532  BP:  105/66 127/83 107/73  Pulse: 78 71 94 73  Temp:  97.9 F (36.6 C) 97.6 F (36.4 C)   TempSrc:  Oral Oral   Resp:  20 20 20   Height:    5\' 7"  (1.702 m)  Weight:    181.167 kg (399 lb 6.4 oz)   SpO2: 94% 98% 99% 96%    Intake/Output Summary (Last 24 hours) at 11/10/12 1253 Last data filed at 11/10/12 1015  Gross per 24 hour  Intake    363 ml  Output    830 ml  Net   -467 ml     Filed Weights   11/09/12 0230 11/09/12 0439 11/10/12 0532  Weight: 178.717 kg (394 lb) 178.71 kg (393 lb 15.8 oz) 181.167 kg (399 lb 6.4 oz)    Exam:   Afebrile now greater than 48 hours. Vitals stable  General: Room again is dark with shade pulled at lunchtime. Appears calm and more comfortable today.  Cardiovascular: Regular rate and rhythm. No murmur, rub, gallop.  Respiratory: Clear to auscultation bilaterally. No wheezes, rales, rhonchi. Normal respiratory effort.  Musculoskeletal: There is much improved range of motion and movement in the left arm which is moved without pain. There is great improvement in range of motion of the right shoulder, elbow, wrist and fingers. There is dramatically less pain with manipulation of his right arm. He is able to move both legs better with decreased pain in both feet. Skin appears unremarkable without erythema.  Psychiatric: Appears less depressed today.   Data Reviewed:  Basic metabolic panel unremarkable. Blood sugars stable.  White blood cell count 19.5 >> 11.7.  Hemoglobin A1c 5.5.  Pending studies:   None  Scheduled Meds: . colchicine  0.6 mg Oral BID  . docusate sodium  100 mg Oral BID  . enoxaparin (LOVENOX) injection  90 mg Subcutaneous Q24H  . febuxostat  80 mg Oral Daily  . insulin aspart  0-9 Units Subcutaneous TID WC  . pantoprazole  40 mg Oral BID  AC  . predniSONE  20 mg Oral BID WC  . sodium chloride  3 mL Intravenous Q12H   Continuous Infusions:    Active Problems:   Acute gouty arthritis   Morbid obesity   Cellulitis   Tinea pedis   Polyarthritis   Time spent 15 minutes

## 2012-11-11 DIAGNOSIS — M109 Gout, unspecified: Principal | ICD-10-CM

## 2012-11-11 LAB — GLUCOSE, CAPILLARY
Glucose-Capillary: 104 mg/dL — ABNORMAL HIGH (ref 70–99)
Glucose-Capillary: 152 mg/dL — ABNORMAL HIGH (ref 70–99)

## 2012-11-11 NOTE — Clinical Social Work Note (Signed)
CSW spoke with numerous facilities today in attempt to find SNF bed. Several are considering and presenting referral to DON/Administrator. Will follow up in AM.  Derenda Fennel, LCSW [phone]

## 2012-11-11 NOTE — Progress Notes (Signed)
Physical Therapy Treatment Patient Details Name: [name] MRN: [id number] DOB: February 01, 1986 Today's Date: 11/11/2012 Time: 4098-1191 PT Time Calculation (min): 18 min  PT Assessment / Plan / Recommendation     Clinical Impression Pt is willing to complete bed exercises. He refuses to attempt to stand. Pt displays fascial grimace and begins to cry with the first exercise completed (ankle pumps). Though exercises are painfull he is willing to continue. Educated pt on the importance of moving and how it can decrease pain.                           Plan Current plan remains appropriate       Pertinent Vitals/Pain 8-9/10 in BLE at rest; increases to 10/10 with movement        Exercises General Exercises - Lower Extremity Ankle Circles/Pumps: AROM;Both;5 reps;Supine Quad Sets: Strengthening;Both;5 reps;Supine Gluteal Sets: Strengthening;Both;5 reps;Supine Heel Slides: AROM;Both;5 reps;Supine Hip ABduction/ADduction: AROM;Both;5 reps;Supine    Visit Information  Last PT Received On: 11/11/12    Subjective Data  Pt states that he is hurting but he is willing to complete bed exercises.        End of Session PT - End of Session Activity Tolerance: Patient limited by pain Patient left: in bed;with call bell/phone within reach   Seth Bake, PTA  11/11/2012, 5:05 PM

## 2012-11-11 NOTE — Progress Notes (Signed)
[facility] PROGRESS NOTE  [name] ZOX:[id number] DOB: [birth year] DOA: 11/07/2012 PCP: No primary provider on file.  Assessment/Plan: 1. 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  Fever resolved, white count trending downward. Range of movement and pain has improved. Continue current therapy. Will likely be ready for discharge when bed available 2. Deconditioning: Physical therapy, patient needs motivation to participate. Continues to resist due to pain 3. 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 No s/sx bleeding  4. Steroid-induced hyperglycemia: Continue sliding scale insulin. Well controlled. Hemoglobin A1c normal.CBG range 104-120 5. Morbid obesity BMI 62.7  Code Status: full Family Communication: none present Disposition Plan: snf hopefully 24 hours   Consultants:  none  Procedures:  none  Antibiotics:  none  HPI/Subjective: Awake reports minimal improvement in joint pain  Objective: Filed Vitals:   11/10/12 0532 11/10/12 2032 11/11/12 0158 11/11/12 0448  BP: 107/73 134/78 120/82 114/67  Pulse: 73 77 70 85  Temp:  97.2 F (36.2 C) 97.4 F (36.3 C) 97.2 F (36.2 C)  TempSrc:  Oral Oral Oral  Resp: 20 20 20 20   Height: 5\' 7"  (1.702 m)     Weight: 399 lb 6.4 oz (181.167 kg)  400 lb 11.2 oz (181.756 kg)   SpO2: 96% 100% 100% 98%    Intake/Output Summary (Last 24 hours) at 11/11/12 1351 Last data filed at 11/11/12 0900  Gross per 24 hour  Intake   1240 ml  Output   1450 ml  Net   -210 ml   Filed Weights   11/09/12 0439 11/10/12 0532 11/11/12 0158  Weight: 393 lb 15.8 oz (178.71 kg) 399 lb 6.4 oz (181.167 kg) 400 lb 11.2 oz (181.756 kg)    Exam:   General:  Morbidly obese NAD  Cardiovascular: RRR No MGR No LE edema  Respiratory: normal effort BS clear bilaterally to ausculation. No wheeze no rhonchi  Abdomen: morbidly obese soft +BS non-tender to  palpation  Musculoskeletal: ROM to left arm and right shoulder and bilateral wrists continues to improve very slowly. Moves LE but continues to complain of pain with movement.   Data Reviewed: Basic Metabolic Panel:  Recent Labs Lab 11/07/12 2231 11/08/12 0526 11/09/12 0612 11/10/12 0701  NA 134* 134* 137 138  K 4.1 4.1 4.1 3.5  CL 93* 95* 99 100  CO2 30 29 26  32  GLUCOSE 140* 251* 190* 94  BUN 8 8 10 15   CREATININE 0.59 0.59 0.50 0.51  CALCIUM 9.4 9.4 9.3 9.3   Liver Function Tests:  Recent Labs Lab 11/08/12 0526  AST 13  ALT 21  ALKPHOS 91  BILITOT 1.0  PROT 7.5  ALBUMIN 2.5*   No results found for this basename: LIPASE, AMYLASE,  in the last 168 hours No results found for this basename: AMMONIA,  in the last 168 hours CBC:  Recent Labs Lab 11/07/12 2231 11/08/12 0526 11/09/12 0612 11/10/12 0701  WBC 17.0* 15.0* 19.5* 11.7*  NEUTROABS 13.1*  --   --   --   HGB 12.2* 12.8* 11.4* 12.4*  HCT 36.7* 39.6 35.6* 39.4  MCV 82.8 83.7 83.8 85.3  PLT 272 242 295 288   Cardiac Enzymes: No results found for this basename: CKTOTAL, CKMB, CKMBINDEX, TROPONINI,  in the last 168 hours BNP (last 3 results) No results found for this basename: PROBNP,  in the last 8760 hours CBG:  Recent Labs Lab 11/10/12 1205 11/10/12 1610 11/10/12 2049 11/11/12  0720 11/11/12 1135  GLUCAP 150* 120* 137* 104* 120*    No results found for this or any previous visit (from the past 240 hour(s)).   Studies: No results found.  Scheduled Meds: . colchicine  0.6 mg Oral BID  . docusate sodium  100 mg Oral BID  . enoxaparin (LOVENOX) injection  90 mg Subcutaneous Q24H  . febuxostat  80 mg Oral Daily  . insulin aspart  0-9 Units Subcutaneous TID WC  . pantoprazole  40 mg Oral BID AC  . predniSONE  20 mg Oral BID WC  . sodium chloride  3 mL Intravenous Q12H   Continuous Infusions:   Active Problems:   Acute gouty arthritis   Morbid obesity   Cellulitis   Tinea pedis    Polyarthritis    Time spent: 35 minutes    [facility] M  [facility] Pager [phone]. If 7PM-7AM, please contact night-coverage at [email or web address], password [facility] 11/11/2012, 1:51 PM  LOS: 4 days

## 2012-11-11 NOTE — Progress Notes (Signed)
Patient seen, independently examined and chart reviewed. I agree with exam, assessment and plan discussed with Toya Smothers, NP. Note the patient has bilateral lower extremity edema without change.  Overnight events noted--patient is able to get to the bathroom with the assistance of his stepfather.  Patient observed from the doorway, moving both arms without apparent pain.  He reports he actually feels somewhat better. He has improved range of movement with his right arm and decreased pain. He has less pain in his left hand improved grip strength. His right leg has improved as well with decreased pain in his foot and he is able to move his leg which better. The left foot continues to be the most painful part of the body.  Clinically the patient is improving with improved range of movement in all his extremities and decreased signs and symptoms of pain. He demonstrates more pain and complaints with active examination than he does with observation. He remains afebrile and his leukocytosis has nearly resolved. Although no arthrocentesis could be performed, his history is most suggestive of gout as previously documented. He appears to be responding nicely to therapy. Given his lack of financial resources, failure to thrive and poor participation in his own care skilled rehabilitation would be the best option to restore his independence in the community.  Brendia Sacks, MD [facility] [phone]

## 2012-11-12 LAB — GLUCOSE, CAPILLARY
Glucose-Capillary: 105 mg/dL — ABNORMAL HIGH (ref 70–99)
Glucose-Capillary: 110 mg/dL — ABNORMAL HIGH (ref 70–99)
Glucose-Capillary: 98 mg/dL (ref 70–99)

## 2012-11-12 MED ORDER — HYDROMORPHONE HCL PF 1 MG/ML IJ SOLN
0.5000 mg | INTRAMUSCULAR | Status: DC | PRN
  Administered 2012-11-12 – 2012-11-13 (×4): 0.5 mg via INTRAVENOUS
  Filled 2012-11-12 (×4): qty 1

## 2012-11-12 NOTE — Progress Notes (Signed)
Physical Therapy Treatment Patient Details Name: [name] MRN: [id number] DOB: 02/18/1986 Today's Date: 11/12/2012 Time: 4098-1191 PT Time Calculation (min): 22 min  PT Assessment / Plan / Recommendation  History of Present Illness     Clinical Impression Pt states that he is feeling a bit better but is still unable to try to mobilize OOB.  He is willing to do minimal bed exercise with his LEs but will not DF/PF his ankles due to reported pain bilaterally (he does get toe motion in). He does continue to grimace and cry with most motions.  He gets very agitated with any pushing on the part of the therapist to try to progress past this minimal amount of activity.   PT Comments     Follow Up Recommendations        Does the patient have the potential to tolerate intense rehabilitation     Barriers to Discharge        Equipment Recommendations       Recommendations for Other Services    Frequency     Progress towards PT Goals Progress towards PT goals: Not progressing toward goals - comment (pt is poorly motivated)  Plan Current plan remains appropriate    Precautions / Restrictions Precautions Precautions: Fall Restrictions Weight Bearing Restrictions: No   Pertinent Vitals/Pain     Mobility  Bed Mobility Details for Bed Mobility Assistance: pt refuses to try to get OOB or to move within the bed    Exercises General Exercises - Lower Extremity Ankle Circles/Pumps: AROM;Both;5 reps;Supine Quad Sets: AROM;Both;5 reps;Supine Gluteal Sets: AROM;Both;Supine;5 reps Heel Slides: AROM;Both;5 reps;Supine Hip ABduction/ADduction: AROM;Both;5 reps;Supine   PT Diagnosis:    PT Problem List:   PT Treatment Interventions:     PT Goals (current goals can now be found in the care plan section)    Visit Information  Last PT Received On: 11/12/12    Subjective Data      Cognition  Cognition Arousal/Alertness: Awake/alert Behavior During Therapy: [facility] for tasks  assessed/performed Overall Cognitive Status: Within Functional Limits for tasks assessed    Balance     End of Session PT - End of Session Activity Tolerance: Patient limited by pain Patient left: in bed;with call bell/phone within reach   GP     Myrlene Broker L 11/12/2012, 11:36 AM

## 2012-11-12 NOTE — Progress Notes (Signed)
[facility] PROGRESS NOTE  [name] YNW:[id number] DOB: 09-02-1985 DOA: 11/07/2012 PCP: No primary provider on file.  Assessment/Plan: 1. 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 Increase range of motion to right arm.  Fever resolved. Continue current therapy. Will likely be ready for discharge when bed available.  2. Deconditioning: Physical therapy, patient needs motivation to participate. Continues to resist due to pain. Attempts to participate tearfully. Little progress. Notes from 2 nights ago indicate pt can get to [facility] with assistance.  3. 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 No s/sx bleeding  4. Steroid-induced hyperglycemia: Continue sliding scale insulin. Well controlled. Hemoglobin A1c normal.CBG range 105-110 5. Morbid obesity BMI 62.7  Code Status: full Family Communication: none present Disposition Plan: await bed in rehab. Hopefully 24-48 hours   Consultants:  none  Procedures:  none  Antibiotics:  none  HPI/Subjective: Sitting up in bed eating lunch. Reports "still hurts". ROM to right arm improved somewhat  Objective: Filed Vitals:   11/12/12 0416 11/12/12 0559 11/12/12 0828 11/12/12 1000  BP:  114/71  121/72  Pulse:  62 90 80  Temp:  97.5 F (36.4 C)  97.5 F (36.4 C)  TempSrc:  Oral  Oral  Resp:  20  20  Height:      Weight: 400 lb 11.2 oz (181.756 kg)     SpO2:  97% 99% 99%    Intake/Output Summary (Last 24 hours) at 11/12/12 1220 Last data filed at 11/12/12 1212  Gross per 24 hour  Intake    960 ml  Output   2725 ml  Net  -1765 ml   Filed Weights   11/10/12 0532 11/11/12 0158 11/12/12 0416  Weight: 399 lb 6.4 oz (181.167 kg) 400 lb 11.2 oz (181.756 kg) 400 lb 11.2 oz (181.756 kg)    Exam:   General:  Morbidly obese NAD  Cardiovascular: RRR, no murmur no gallup no rub 1+LE edema   Respiratory: normal effort BS clear to  ausculation bilaterally no wheeze no rhonchi  Abdomen: obese soft +BS non-tender to palpation  Musculoskeletal: ROM to right arm slightly improved. Right arm remains tender to touch. Able to use left arm without any obvious pain. LE remain tender to touch as well.    Data Reviewed: Basic Metabolic Panel:  Recent Labs Lab 11/07/12 2231 11/08/12 0526 11/09/12 0612 11/10/12 0701  NA 134* 134* 137 138  K 4.1 4.1 4.1 3.5  CL 93* 95* 99 100  CO2 30 29 26  32  GLUCOSE 140* 251* 190* 94  BUN 8 8 10 15   CREATININE 0.59 0.59 0.50 0.51  CALCIUM 9.4 9.4 9.3 9.3   Liver Function Tests:  Recent Labs Lab 11/08/12 0526  AST 13  ALT 21  ALKPHOS 91  BILITOT 1.0  PROT 7.5  ALBUMIN 2.5*   No results found for this basename: LIPASE, AMYLASE,  in the last 168 hours No results found for this basename: AMMONIA,  in the last 168 hours CBC:  Recent Labs Lab 11/07/12 2231 11/08/12 0526 11/09/12 0612 11/10/12 0701  WBC 17.0* 15.0* 19.5* 11.7*  NEUTROABS 13.1*  --   --   --   HGB 12.2* 12.8* 11.4* 12.4*  HCT 36.7* 39.6 35.6* 39.4  MCV 82.8 83.7 83.8 85.3  PLT 272 242 295 288   Cardiac Enzymes: No results found for this basename: CKTOTAL, CKMB, CKMBINDEX, TROPONINI,  in the last 168 hours BNP (last 3 results) No  results found for this basename: PROBNP,  in the last 8760 hours CBG:  Recent Labs Lab 11/11/12 1135 11/11/12 1636 11/11/12 2043 11/12/12 0726 11/12/12 1203  GLUCAP 120* 152* 127* 105* 110*    No results found for this or any previous visit (from the past 240 hour(s)).   Studies: No results found.  Scheduled Meds: . colchicine  0.6 mg Oral BID  . docusate sodium  100 mg Oral BID  . enoxaparin (LOVENOX) injection  90 mg Subcutaneous Q24H  . febuxostat  80 mg Oral Daily  . insulin aspart  0-9 Units Subcutaneous TID WC  . pantoprazole  40 mg Oral BID AC  . predniSONE  20 mg Oral BID WC  . sodium chloride  3 mL Intravenous Q12H   Continuous Infusions:    Active Problems:   Acute gouty arthritis   Morbid obesity   Cellulitis   Tinea pedis   Polyarthritis    Time spent: 30 minutes    [facility] M  [facility] Pager [phone]. If 7PM-7AM, please contact night-coverage at [email or web address], password [facility] 11/12/2012, 12:20 PM  LOS: 5 days

## 2012-11-12 NOTE — Care Management Note (Signed)
    Page 1 of 1   11/12/2012     3:13:01 PM   CARE MANAGEMENT NOTE 11/12/2012  Patient:  [name], [name]   Account Number:  [id number]  Date Initiated:  11/12/2012  Documentation initiated by:  Rosemary Holms  Subjective/Objective Assessment:   Pt admitted with Gout and great pain. Pt has no insurance, money to afford meds and no home to return to. CSW searching for placement.     Action/Plan:   Anticipated DC Date:  11/13/2012   Anticipated DC Plan:  ASSISTED LIVING / REST HOME  In-house referral  Clinical Social Worker      DC Planning Services  CM consult      Choice offered to / List presented to:             Status of service:  Completed, signed off Medicare Important Message given?   (If response is "NO", the following Medicare IM given date fields will be blank) Date Medicare IM given:   Date Additional Medicare IM given:    Discharge Disposition:    Per UR Regulation:    If discussed at Long Length of Stay Meetings, dates discussed:   11/12/2012    Comments:  11/12/12 Rosemary Holms RN BSN CM

## 2012-11-12 NOTE — Progress Notes (Signed)
Patient seen, independently examined and chart reviewed. I agree with exam, assessment and plan discussed with Toya Smothers, NP.  He reports some continued improvement. His left arm is doing much better and the pain has decreased in his right arm as well. He is moving his right leg much better. No left knee pain but does continue to have left foot pain. He is eating okay.  Exam: Each time I have entered the room regardless of the time of day the room is dark the shade is drawn and the patient is sleeping. He appears more calm today more comfortable. Range of movement of the upper extremities continues to improve with good range of movement now in right shoulder, right elbow, right wrist and hand. The left arm moves quite well. Right leg moves quite well with minimal pain. The left leg moves fairly well, no pain in the knee. Continues to cry in pain with the lightest touch anywhere on his foot or toes.  He remains afebrile and laboratory studies have shown improvement. Clinically he is much improved. Motivation is poor but he was able to get up to the bathroom 2 days ago with minimal assistance. He needs encouragement in order to participate in his own care. Skilled nursing facility remains the best disposition of possible. Continue current medications for gout as he is clearly showing improvement. He is ready for discharge as a bed is available.  Will increase interval for IV hydromorphone. Consider discontinuing next 24 hours. Brendia Sacks, MD [facility] [phone]

## 2012-11-13 DIAGNOSIS — M130 Polyarthritis, unspecified: Secondary | ICD-10-CM

## 2012-11-13 LAB — GLUCOSE, CAPILLARY
Glucose-Capillary: 87 mg/dL (ref 70–99)
Glucose-Capillary: 115 mg/dL — ABNORMAL HIGH (ref 70–99)

## 2012-11-13 MED ORDER — PANTOPRAZOLE SODIUM 40 MG PO TBEC: 40.0000 mg | DELAYED_RELEASE_TABLET | Freq: Two times a day (BID) | ORAL | Status: DC

## 2012-11-13 MED ORDER — OXYCODONE HCL 5 MG PO TABS: 5.0000 mg | ORAL_TABLET | ORAL | Status: DC | PRN

## 2012-11-13 MED ORDER — COLCHICINE 0.6 MG PO TABS: 0.6000 mg | ORAL_TABLET | Freq: Two times a day (BID) | ORAL | Status: DC

## 2012-11-13 MED ORDER — PREDNISONE 5 MG PO TABS: ORAL_TABLET | ORAL | Status: DC

## 2012-11-13 MED ORDER — PREDNISONE 20 MG PO TABS: 20.0000 mg | ORAL_TABLET | Freq: Two times a day (BID) | ORAL | Status: DC

## 2012-11-13 MED ORDER — FEBUXOSTAT 40 MG PO TABS: 80.0000 mg | ORAL_TABLET | Freq: Every day | ORAL | Status: DC

## 2012-11-13 NOTE — Clinical Social Work Placement (Signed)
Clinical Social Work Department CLINICAL SOCIAL WORK PLACEMENT NOTE 11/13/2012  Patient:  [name], [name]  Account Number:  [id number] Admit date:  11/07/2012  Clinical Social Worker:  Derenda Fennel, LCSW  Date/time:  11/08/2012 04:00 PM  Clinical Social Work is seeking post-discharge placement for this patient at the following level of care:   SKILLED NURSING   (*CSW will update this form in Epic as items are completed)   11/08/2012  Patient/family provided with [facility] Department of Clinical Social Work's list of facilities offering this level of care within the geographic area requested by the patient (or if unable, by the patient's family).  11/08/2012  Patient/family informed of their freedom to choose among providers that offer the needed level of care, that participate in Medicare, Medicaid or managed care program needed by the patient, have an available bed and are willing to accept the patient.  11/08/2012  Patient/family informed of [facility]' ownership interest in [facility], as well as of the fact that they are under no obligation to receive care at this facility.  PASARR submitted to EDS on  PASARR number received from EDS on   FL2 transmitted to all facilities in geographic area requested by pt/family on  11/08/2012 FL2 transmitted to all facilities within larger geographic area on 11/08/2012  Patient informed that his/her managed care company has contracts with or will negotiate with  certain facilities, including the following:     Patient/family informed of bed offers received:  11/13/2012 Patient chooses bed at OTHER Physician recommends and patient chooses bed at  OTHER  Patient to be transferred to OTHER on  11/13/2012 Patient to be transferred to facility by [facility]  The following physician request were entered in Epic:   Additional Comments: Pt has previous pasarr number. Will be LOG. Medicaid prior approval submitted. Bed at  [facility].  Derenda Fennel, [place] [phone]

## 2012-11-13 NOTE — Progress Notes (Signed)
Called report to [place], LPN at [facility] in [place], [place].  Verbalized understanding.  Pt dc'd to facility via EMS.  [place], Candelaria Stagers 11/13/2012

## 2012-11-13 NOTE — Progress Notes (Signed)
Physical Therapy Treatment Patient Details Name: [name] MRN: [id number] DOB: [birth year] Today's Date: 11/13/2012 Time: 4098-1191 PT Time Calculation (min): 12 min  PT Assessment / Plan / Recommendation     PT Comments   Pt continues to be limited by pain and is only willing to complete bed exercises. Educated pt on the importance of moving and the risk of bed sores. Pt presents with facial grimace and complaint of pain with all movements. Pt also grimaces when therapist pulls the sheet off his feet.  Plan Current plan remains appropriate    Precautions / Restrictions Restrictions Weight Bearing Restrictions: No   Pertinent Vitals/Pain 8/10 in BLE; pre-medicated       Exercises General Exercises - Lower Extremity Ankle Circles/Pumps: AROM;Both;5 reps;Supine Gluteal Sets: AROM;Both;Supine;5 reps Heel Slides: AROM;Both;5 reps;Supine Hip ABduction/ADduction: AROM;Both;5 reps;Supine    Visit Information  Last PT Received On: 11/13/12    Subjective Data  I'm hurting but I will do some exercises.  End of Session PT - End of Session Activity Tolerance: Patient limited by pain Patient left: in bed;with call bell/phone within reach   Seth Bake, PTA  11/13/2012, 10:54 AM

## 2012-11-13 NOTE — Discharge Summary (Signed)
Physician Discharge Summary  [name]:[id number] DOB: 12-Oct-1985 DOA: 11/07/2012  PCP: No primary provider on file.  Admit date: 11/07/2012 Discharge date: 11/13/2012  Time spent: 45 minutes  Recommendations for Outpatient Follow-up:  Pt being discharged to [facility] facility in [place]. Recommend evaluation of symptoms 1 week. Recommend daily PT   Discharge Diagnoses:  Active Problems:   Acute gouty arthritis   Morbid obesity   Cellulitis   Tinea pedis   Polyarthritis   Discharge Condition: stable  Diet recommendation: regular  Filed Weights   11/11/12 0158 11/12/12 0416 11/13/12 0419  Weight: 400 lb 11.2 oz (181.756 kg) 400 lb 11.2 oz (181.756 kg) 402 lb 6.4 oz (182.527 kg)    History of present illness:  [name] is a 27 y.o. morbidly obese young Caucasian gentleman; Well-known to the hospitalist with recurrent admissions for cellulitis and also gout affecting principally the left knee and left foot. Patient suffered a massive GI bleed in the past as a result of the use of NSAIDS to treat gout. He has not responded well to colchicine as an inpatient, and is not able to afford colchicine for maintenance as an outpatient. He has responded some to steroids and high-dose PPIs, and was discharged 3 weeks prior to presentation on 11/08/12 with a steroid taper.   He reported that he had not improved significantly since discharge, had been unable to walk, and was getting  around by crawling on all 4s. For the previous 2 days he had a rapid onset of progressive swelling of essentially all the joints of his extremities, and developed severe lower back pain, and was unable to get around at all.  He was brought to the emergency room by EMS found to be febrile and have a leukocytosis, with multiple inflamed joints. He was given intravenous Solu-Medrol and the hospitalist service called for admission. Hours later he continued to complain of pain in the joints.    Hospital coarse: Acute on chronic polyarticular arthritis, presumed gout: Continue steroids, colchicine, discussed with Dr. Hilda Lias from orthopedics. No effusion at this point and therefore no arthrocentesis. Although the patient has never had arthrocentesis, he reports he has had gout for at least 5 years which he has only some diminished with NSAIDs. His left foot pain and left wrist pain is been going on for months. He does report increased pain in his right shoulder, elbow and wrist. Review of his record is notable for multiple x-rays over the years especially of right and left ankles 2004, 2005, 2006, 2008. These x-rays revealed diffuse edema and at times joint effusions. He has had multiple uric acid studies done over the years which have always been elevated. His exam is complicated by morbid obesity, limited effort and is inconsistent with joint mobility based on observation versus examination. This has been noted by physical therapy and by orthopedics as well. Additionally his pain is out of proportion to examination. The chronicity of this acute flare and general view of the record is highly suggestive of gout. His fever and leukocytosis is most consistent with gout as he has no signs or symptoms to suggest infection. Lack of effusion and polyarticular nature would argue against this as well. ESR is been mildly elevated in the past. Discussed this with rheumatology at [facility] in [place] who recommended the above treatment. No other studies recommended at this time. At discharge much improved. His left arm is doing much better and the pain has decreased in his right  arm as well. He is moving his right leg much better. No left knee pain but does continue to have left foot pain. He is eating okay.  Deconditioning: Difficult to assess but he consistently reports poor mobility. Given his noncompliance (he declined to be set up with a primary care physician in last hospitalization, and very limited  financial resources preventing him from obtaining medication even less than $10 price) he would best be served by placement if possible. Motivation is poor but he was able to get up to the bathroom 2 days ago with minimal assistance. He needs encouragement in order to participate in his own care. 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 Given limited treatment options, risk of steroids is outweighed by potential benefit at this point.  Steroid-induced hyperglycemia: Hemoglobin A1c 5.5. SSI used during hospitalization. Anticipate resolution once off steroids  Morbid obesity: BMI 62.7. Nutritional consult.    Procedures: none  Consultations:  none  Discharge Exam: Filed Vitals:   11/13/12 0138 11/13/12 0419 11/13/12 0519 11/13/12 1015  BP: 116/78  140/77 130/82  Pulse: 81  91 71  Temp: 98.1 F (36.7 C)  97.5 F (36.4 C) 97.5 F (36.4 C)  TempSrc: Oral  Oral Oral  Resp: 20  20 21   Height:      Weight:  402 lb 6.4 oz (182.527 kg)    SpO2: 97%  100% 100%    General: morbidly obese NAD Cardiovascular: RRR No MGR 1-2+LE edema PPP Respiratory: normal effort BS clear bilaterally to auscultation. No wheeze no rhonchi Abdomen: morbidly obese. Non-tender to palpation Musculoskeletal: improved ROM to right arm and left wrist and left foot. Joints less tender to touch  Discharge Instructions       Future Appointments Provider Department Dept Phone   12/06/2012 10:30 AM De Blanch [facility] [phone]       Medication List         colchicine 0.6 MG tablet  Take 1 tablet (0.6 mg total) by mouth 2 (two) times daily.     febuxostat 40 MG tablet  Commonly known as:  ULORIC  Take 2 tablets (80 mg total) by mouth daily.     oxyCODONE 5 MG immediate release tablet  Commonly known as:  Oxy IR/ROXICODONE  Take 1-2 tablets (5-10 mg total) by mouth every 4 (four) hours as needed.      pantoprazole 40 MG tablet  Commonly known as:  PROTONIX  Take 1 tablet (40 mg total) by mouth 2 (two) times daily before a meal.     predniSONE 5 MG tablet  Commonly known as:  DELTASONE  Take 3 tabs 7/31 and 8/1, take 2 tabs 8/2 and 8/3 take 1 tab 8/4 and 8/5 then stop.       Allergies  Allergen Reactions  . Asa (Aspirin) Other (See Comments)    Pt doesn't know reaction.  Marland Kitchen Penicillins Other (See Comments)    Pt doesn't know reaction.  . Sulfa Antibiotics Other (See Comments)    Pt doesn't know reaction.   Follow-up Information   Please follow up.   Contact information:   [facility] facility in [place]. follow up with attending MD       The results of significant diagnostics from this hospitalization (including imaging, microbiology, ancillary and laboratory) are listed below for reference.    Significant Diagnostic Studies: Dg Chest Portable 1 View  11/07/2012   *RADIOLOGY REPORT*  Clinical Data: Pain; history of gout  PORTABLE CHEST - 1 VIEW  Comparison: None.  Findings: Lungs clear.  Heart size and pulmonary vascularity are normal.  No adenopathy.  No bone lesions.  No pneumothorax.  IMPRESSION: No abnormality noted.   Original Report Authenticated By: Bretta Bang, M.D.    Microbiology: No results found for this or any previous visit (from the past 240 hour(s)).   Labs: Basic Metabolic Panel:  Recent Labs Lab 11/07/12 2231 11/08/12 0526 11/09/12 0612 11/10/12 0701  NA 134* 134* 137 138  K 4.1 4.1 4.1 3.5  CL 93* 95* 99 100  CO2 30 29 26  32  GLUCOSE 140* 251* 190* 94  BUN 8 8 10 15   CREATININE 0.59 0.59 0.50 0.51  CALCIUM 9.4 9.4 9.3 9.3   Liver Function Tests:  Recent Labs Lab 11/08/12 0526  AST 13  ALT 21  ALKPHOS 91  BILITOT 1.0  PROT 7.5  ALBUMIN 2.5*   No results found for this basename: LIPASE, AMYLASE,  in the last 168 hours No results found for this basename: AMMONIA,  in the last 168 hours CBC:  Recent Labs Lab  11/07/12 2231 11/08/12 0526 11/09/12 0612 11/10/12 0701  WBC 17.0* 15.0* 19.5* 11.7*  NEUTROABS 13.1*  --   --   --   HGB 12.2* 12.8* 11.4* 12.4*  HCT 36.7* 39.6 35.6* 39.4  MCV 82.8 83.7 83.8 85.3  PLT 272 242 295 288   Cardiac Enzymes: No results found for this basename: CKTOTAL, CKMB, CKMBINDEX, TROPONINI,  in the last 168 hours BNP: BNP (last 3 results) No results found for this basename: PROBNP,  in the last 8760 hours CBG:  Recent Labs Lab 11/12/12 0726 11/12/12 1203 11/12/12 1634 11/12/12 2105 11/13/12 0713  GLUCAP 105* 110* 98 134* 87       Signed:  BLACK,KAREN M  [facility] 11/13/2012, 11:25 AM  Attending note:  Patient seen and examined.  Above note reviewed.  Patient is clinically improved.  For placement today.  MEMON,JEHANZEB

## 2012-11-13 NOTE — Clinical Social Work Note (Signed)
Pt d/c today to [facility] in [place] as bed offer made there. Pt accepts. He states he has not been in a car in months and is unable to sit up for long periods of time, so [facility] to transfer. D/C summary and FL2 faxed. CSW submitted for Medicaid prior approval and supervisor to complete LOG for facility.   Derenda Fennel, [place] [phone]

## 2012-12-06 ENCOUNTER — Ambulatory Visit (INDEPENDENT_AMBULATORY_CARE_PROVIDER_SITE_OTHER): Payer: Medicaid Other | Admitting: Gastroenterology

## 2012-12-06 ENCOUNTER — Encounter: Payer: Self-pay | Admitting: Internal Medicine

## 2012-12-06 VITALS — BP 134/88 | HR 102 | Temp 98.2°F | Ht 67.0 in

## 2012-12-06 DIAGNOSIS — K279 Peptic ulcer, site unspecified, unspecified as acute or chronic, without hemorrhage or perforation: Secondary | ICD-10-CM

## 2012-12-06 DIAGNOSIS — K259 Gastric ulcer, unspecified as acute or chronic, without hemorrhage or perforation: Secondary | ICD-10-CM

## 2012-12-06 DIAGNOSIS — K254 Chronic or unspecified gastric ulcer with hemorrhage: Secondary | ICD-10-CM

## 2012-12-06 DIAGNOSIS — K2210 Ulcer of esophagus without bleeding: Secondary | ICD-10-CM

## 2012-12-06 NOTE — Assessment & Plan Note (Signed)
27 year old with history of NSAID-related GI bleed secondary to gastric ulcer in 08/2012. He is due surveillance EGD at this time. He has been on lansoprazole 30 mg twice a day for 3 months. He denies any reflux symptoms or abdominal pain. No further GI bleeding noted. Hemoglobin relatively good as outlined above. Denies any NSAID use. He reports waking up during the EGD.  EGD in the near future with Dr. Jena Gauss. Augment conscious sedation with Phenergan 25 mg IV 30 minutes before the procedure.  I have discussed the risks, alternatives, benefits with regards to but not limited to the risk of reaction to medication, bleeding, infection, perforation and the patient is agreeable to proceed. Written consent to be obtained.  Continue lansoprazole BID.

## 2012-12-06 NOTE — Progress Notes (Signed)
Primary Care Physician:  No primary provider on file.  Primary Gastroenterologist:  Trystin Rourk, MD   Chief Complaint  Patient presents with  . Follow-up    HPI:  [name] is a 27 y.o. male here to schedule 3 month followup EGD. He presented to the hospital in May of 2014 with hematemesis in the setting of NSAID use. He had distal esophageal erosions, prepyloric benign-appearing gastric ulcer with bleeding stigmata status post bleeding control therapy. H. pylori serologies were negative.  Patient has been in the hospital couple times since then with gout flares. He states he's not unable to walk for over 2 months due to pain. Currently he is in rehabilitation and [place], [place] but states it is difficult for him to participate in physical therapy due to the excruciating pain in his feet and ankles. Denies NSAID use. He has been on prednisone intermittently and currently is on a taper.  No abdominal pain. No heartburn. No melena, brbpr. No dysphagia. Appetite ok. No constipation or diarrhea.    Current Outpatient Prescriptions  Medication Sig Dispense Refill  . acetaminophen (TYLENOL) 325 MG tablet Take 650 mg by mouth every 6 (six) hours as needed for pain.      . bisacodyl (DULCOLAX) 10 MG suppository Place 10 mg rectally as needed for constipation.      . colchicine 0.6 MG tablet Take 1 tablet (0.6 mg total) by mouth 2 (two) times daily.  60 tablet  0  . febuxostat (ULORIC) 40 MG tablet Take 2 tablets (80 mg total) by mouth daily.  30 tablet  0  . gabapentin (NEURONTIN) 100 MG capsule Take 100 mg by mouth 3 (three) times daily.      . lansoprazole (PREVACID) 30 MG capsule Take 30 mg by mouth 2 (two) times daily.       . magnesium hydroxide (MILK OF MAGNESIA) 400 MG/5ML suspension Take 30 mLs by mouth daily as needed for constipation.      . oxyCODONE (OXY IR/ROXICODONE) 5 MG immediate release tablet Take 1-2 tablets (5-10 mg total) by mouth every 4 (four) hours as  needed.  30 tablet  0  . predniSONE (DELTASONE) 5 MG tablet Take 3 tabs 7/31 and 8/1, take 2 tabs 8/2 and 8/3 take 1 tab 8/4 and 8/5 then stop.  12 tablet  0   No current facility-administered medications for this visit.    Allergies as of 12/06/2012 - Review Complete 12/06/2012  Allergen Reaction Noted  . Asa [aspirin] Other (See Comments) 03/05/2012  . Penicillins Other (See Comments) 03/05/2012  . Sulfa antibiotics Other (See Comments) 03/05/2012    Past Medical History  Diagnosis Date  . Gout   . Cellulitis   . Obesity   . Erosive esophagitis 09/01/2012    NSAID induced.  . Gastric ulcer with hemorrhage 09/02/2012    s/p bleeding control tx. Per Dr. Rourk  . Acute blood loss anemia 09/02/2012    S/p 1 unit rbcs    Past Surgical History  Procedure Laterality Date  . Tonsillectomy    . Esophagogastroduodenoscopy Left 09/01/2012    RMR:Distal esophageal erosions consistent with erosive reflux esophagitis/Pre-pyloric benign-appearing gastric ulcer with bleeding stigmata status post bleeding control therapy as described above    Family History  Problem Relation Age of Onset  . Heart failure Mother     History   Social History  . Marital Status: Single    Spouse Name: N/A    Number of Children: N/A  .   Years of Education: N/A   Occupational History  . Not on file.   Social History Main Topics  . Smoking status: Former Smoker -- 0.50 packs/day for 7 years    Types: Cigarettes  . Smokeless tobacco: Former User    Types: Snuff  . Alcohol Use: Yes     Comment: very occasional  . Drug Use: No  . Sexual Activity: No   Other Topics Concern  . Not on file   Social History Narrative  . No narrative on file      ROS:  General: Negative for anorexia, weight loss, fever, chills, fatigue, weakness. Eyes: Negative for vision changes.  ENT: Negative for hoarseness, difficulty swallowing , nasal congestion. CV: Negative for chest pain, angina, palpitations, dyspnea on  exertion, peripheral edema.  Respiratory: Negative for dyspnea at rest, dyspnea on exertion, cough, sputum, wheezing.  GI: See history of present illness. GU:  Negative for dysuria, hematuria, urinary incontinence, urinary frequency, nocturnal urination.  MS: Chronic joint pain, lower extremity  Derm: Negative for rash or itching.  Neuro: Negative for weakness, abnormal sensation, seizure, frequent headaches, memory loss, confusion.  Psych: Negative for anxiety, depression, suicidal ideation, hallucinations.  Endo: Negative for unusual weight change.  Heme: Negative for bruising or bleeding. Allergy: Negative for rash or hives.    Physical Examination:  BP 134/88  Pulse 102  Temp(Src) 98.2 F (36.8 C) (Oral)  Ht 5' 7" (1.702 m)  Patient refuses to stand on the scales. States he will have too much pain if he gets out of the wheelchair.  General: Well-nourished, well-developed in no acute distress. Accompanied by mother. Head: Normocephalic, atraumatic.   Eyes: Conjunctiva pink, no icterus. Mouth: Oropharyngeal mucosa moist and pink , no lesions erythema or exudate. Neck: Supple without thyromegaly, masses, or lymphadenopathy.  Lungs: Clear to auscultation bilaterally.  Heart: Regular rate and rhythm, no murmurs rubs or gallops.  Abdomen: Bowel sounds are normal, nontender, nondistended, no hepatosplenomegaly or masses, no abdominal bruits or    hernia , no rebound or guarding.   Rectal: Not performed Extremities: No lower extremity edema. No clubbing or deformities.  Neuro: Alert and oriented x 4 , grossly normal neurologically.  Skin: Warm and dry, no rash or jaundice.   Psych: Alert and cooperative, normal mood and affect.  Labs: Lab Results  Component Value Date   WBC 11.7* 11/10/2012   HGB 12.4* 11/10/2012   HCT 39.4 11/10/2012   MCV 85.3 11/10/2012   PLT 288 11/10/2012   Labs from 11/14/2012, white blood cell count 13,000, hemoglobin 12.1, hematocrit 38, MCV 82, platelets  379,000, TSH 2.64, albumin 2.6, sodium 139, potassium 4.1, glucose 76, creatinine 0.62. Hemoglobin A1c 5.7.  Imaging Studies: Dg Chest Portable 1 View  11/07/2012   *RADIOLOGY REPORT*  Clinical Data: Pain; history of gout  PORTABLE CHEST - 1 VIEW  Comparison: None.  Findings: Lungs clear.  Heart size and pulmonary vascularity are normal.  No adenopathy.  No bone lesions.  No pneumothorax.  IMPRESSION: No abnormality noted.   Original Report Authenticated By: William Woodruff, M.D.      

## 2012-12-06 NOTE — Patient Instructions (Addendum)
1. Upper endoscopy with Dr. Jena Gauss to make sure your ulcer has healed. See separate instructions.  2. Continue lansoprazole 30mg  twice per day.

## 2012-12-09 NOTE — Progress Notes (Signed)
No PCP 

## 2012-12-10 LAB — HEPATIC FUNCTION PANEL
BUN: 11 mg/dL (ref 4–21)
Glucose: 76
HGB: 12.1 g/dL
WBC: 13

## 2012-12-13 ENCOUNTER — Encounter (HOSPITAL_COMMUNITY)
Admission: RE | Disposition: A | Discharge status: Home / Self Care | Payer: Self-pay | Source: Ambulatory Visit | Attending: Internal Medicine

## 2012-12-13 ENCOUNTER — Ambulatory Visit (HOSPITAL_COMMUNITY)
Admission: RE | Admit: 2012-12-13 | Discharge: 2012-12-13 | Disposition: A | Discharge status: Home / Self Care | Payer: Medicaid Other | Source: Ambulatory Visit | Attending: Internal Medicine | Admitting: Internal Medicine

## 2012-12-13 ENCOUNTER — Encounter (HOSPITAL_COMMUNITY): Payer: Self-pay | Admitting: *Deleted

## 2012-12-13 DIAGNOSIS — K259 Gastric ulcer, unspecified as acute or chronic, without hemorrhage or perforation: Secondary | ICD-10-CM | POA: Insufficient documentation

## 2012-12-13 DIAGNOSIS — Z09 Encounter for follow-up examination after completed treatment for conditions other than malignant neoplasm: Secondary | ICD-10-CM

## 2012-12-13 DIAGNOSIS — K449 Diaphragmatic hernia without obstruction or gangrene: Secondary | ICD-10-CM

## 2012-12-13 HISTORY — PX: ESOPHAGOGASTRODUODENOSCOPY: SHX5428

## 2012-12-13 SURGERY — EGD (ESOPHAGOGASTRODUODENOSCOPY)
Anesthesia: Moderate Sedation

## 2012-12-13 MED ORDER — MEPERIDINE HCL 100 MG/ML IJ SOLN
INTRAMUSCULAR | Status: DC | PRN
  Administered 2012-12-13: 50 mg via INTRAVENOUS
  Administered 2012-12-13: 25 mg via INTRAVENOUS
  Administered 2012-12-13: 50 mg via INTRAVENOUS

## 2012-12-13 MED ORDER — MEPERIDINE HCL 100 MG/ML IJ SOLN
INTRAMUSCULAR | Status: AC
  Filled 2012-12-13: qty 2

## 2012-12-13 MED ORDER — MIDAZOLAM HCL 5 MG/5ML IJ SOLN
INTRAMUSCULAR | Status: DC | PRN
  Administered 2012-12-13 (×3): 2 mg via INTRAVENOUS

## 2012-12-13 MED ORDER — BUTAMBEN-TETRACAINE-BENZOCAINE 2-2-14 % EX AERO
INHALATION_SPRAY | CUTANEOUS | Status: DC | PRN
  Administered 2012-12-13: 2 via TOPICAL

## 2012-12-13 MED ORDER — STERILE WATER FOR IRRIGATION IR SOLN
Status: DC | PRN
  Administered 2012-12-13: 11:00:00

## 2012-12-13 MED ORDER — ONDANSETRON HCL 4 MG/2ML IJ SOLN
INTRAMUSCULAR | Status: AC
  Filled 2012-12-13: qty 2

## 2012-12-13 MED ORDER — SODIUM CHLORIDE 0.9 % IJ SOLN
INTRAMUSCULAR | Status: AC
  Filled 2012-12-13: qty 10

## 2012-12-13 MED ORDER — PROMETHAZINE HCL 25 MG/ML IJ SOLN
INTRAMUSCULAR | Status: AC
  Filled 2012-12-13: qty 1

## 2012-12-13 MED ORDER — SODIUM CHLORIDE 0.9 % IV SOLN
INTRAVENOUS | Status: DC
  Administered 2012-12-13: 11:00:00 via INTRAVENOUS

## 2012-12-13 MED ORDER — PROMETHAZINE HCL 25 MG/ML IJ SOLN
25.0000 mg | Freq: Once | INTRAMUSCULAR | Status: AC
  Administered 2012-12-13: 25 mg via INTRAVENOUS

## 2012-12-13 MED ORDER — MIDAZOLAM HCL 5 MG/5ML IJ SOLN
INTRAMUSCULAR | Status: AC
  Filled 2012-12-13: qty 10

## 2012-12-13 NOTE — Op Note (Signed)
[facility] [address] [place], [address]   ENDOSCOPY PROCEDURE REPORT  PATIENT: [name], [name]  MR#: [id number] BIRTHDATE: [birth year] , 27  yrs. old GENDER: Male ENDOSCOPIST: R.  Roetta Sessions, MD FACP [facility] REFERRED BY:     none PROCEDURE DATE:  12/13/2012 PROCEDURE:     followup EGD (surveillance)  INDICATIONS:    History of complicated gastric ulcer disease  INFORMED CONSENT:   The risks, benefits, limitations, alternatives and imponderables have been discussed.  The potential for biopsy, esophogeal dilation, etc. have also been reviewed.  Questions have been answered.  All parties agreeable.  Please see the history and physical in the medical record for more information.  MEDICATIONS: Versed 6 mg IV and Demerol 125 mg IV in divided doses. Zofran 4 mg IV and Phenergan 25 mg IV. Cetacaine spray.  DESCRIPTION OF PROCEDURE:   The EG-[id number] ([id number])  endoscope was introduced through the mouth and advanced to the second portion of the duodenum without difficulty or limitations.  The mucosal surfaces were surveyed very carefully during advancement of the scope and upon withdrawal.  Retroflexion view of the proximal stomach and esophagogastric junction was performed.      FINDINGS: Normal esophagus. Stomach empty. Small hiatal hernia. Previously noted gastric ulcers completely healed.  gastric mucosa appears normal.  Patent pylorus. Abnormal first and second portion of the duodenum  THERAPEUTIC / DIAGNOSTIC MANEUVERS PERFORMED:  None   COMPLICATIONS:  None  IMPRESSION:  Hiatal hernia. Previously noted gastric ulcer completely healed  RECOMMENDATIONS:   Continue to avoid nonsteroidal drugs.  Decrease lansoprazole 30 mg once daily. I strongly recommend you develop a relationship with a primary care physician locally.    _______________________________ R. Roetta Sessions, MD FACP [facility] eSigned:  R. Roetta Sessions, MD FACP [facility] 12/13/2012 11:33  AM     CC:

## 2012-12-13 NOTE — Interval H&P Note (Signed)
History and Physical Interval Note:  12/13/2012 11:07 AM  [name]  has presented today for surgery, with the diagnosis of Gastric Ulcer  The various methods of treatment have been discussed with the patient and family. After consideration of risks, benefits and other options for treatment, the patient has consented to  Procedure(s) with comments: ESOPHAGOGASTRODUODENOSCOPY (EGD) (N/A) - 11:30 as a surgical intervention .  The patient's history has been reviewed, patient examined, no change in status, stable for surgery.  I have reviewed the patient's chart and labs.  Questions were answered to the patient's satisfaction.      Patient seen and examined. No change. EGD per plan.The risks, benefits, limitations, alternatives and imponderables have been reviewed with the patient. Potential for esophageal dilation, biopsy, etc. have also been reviewed.  Questions have been answered. All parties agreeable.  Eula Listen

## 2012-12-13 NOTE — H&P (View-Only) (Signed)
Primary Care Physician:  No primary provider on file.  Primary Gastroenterologist:  Roetta Sessions, MD   Chief Complaint  Patient presents with  . Follow-up    HPI:  [name] is a 27 y.o. male here to schedule 3 month followup EGD. He presented to the hospital in May of 2014 with hematemesis in the setting of NSAID use. He had distal esophageal erosions, prepyloric benign-appearing gastric ulcer with bleeding stigmata status post bleeding control therapy. H. pylori serologies were negative.  Patient has been in the hospital couple times since then with gout flares. He states he's not unable to walk for over 2 months due to pain. Currently he is in rehabilitation and [place], [place] but states it is difficult for him to participate in physical therapy due to the excruciating pain in his feet and ankles. Denies NSAID use. He has been on prednisone intermittently and currently is on a taper.  No abdominal pain. No heartburn. No melena, brbpr. No dysphagia. Appetite ok. No constipation or diarrhea.    Current Outpatient Prescriptions  Medication Sig Dispense Refill  . acetaminophen (TYLENOL) 325 MG tablet Take 650 mg by mouth every 6 (six) hours as needed for pain.      . bisacodyl (DULCOLAX) 10 MG suppository Place 10 mg rectally as needed for constipation.      . colchicine 0.6 MG tablet Take 1 tablet (0.6 mg total) by mouth 2 (two) times daily.  60 tablet  0  . febuxostat (ULORIC) 40 MG tablet Take 2 tablets (80 mg total) by mouth daily.  30 tablet  0  . gabapentin (NEURONTIN) 100 MG capsule Take 100 mg by mouth 3 (three) times daily.      . lansoprazole (PREVACID) 30 MG capsule Take 30 mg by mouth 2 (two) times daily.       . magnesium hydroxide (MILK OF MAGNESIA) 400 MG/5ML suspension Take 30 mLs by mouth daily as needed for constipation.      Marland Kitchen oxyCODONE (OXY IR/ROXICODONE) 5 MG immediate release tablet Take 1-2 tablets (5-10 mg total) by mouth every 4 (four) hours as  needed.  30 tablet  0  . predniSONE (DELTASONE) 5 MG tablet Take 3 tabs 7/31 and 8/1, take 2 tabs 8/2 and 8/3 take 1 tab 8/4 and 8/5 then stop.  12 tablet  0   No current facility-administered medications for this visit.    Allergies as of 12/06/2012 - Review Complete 12/06/2012  Allergen Reaction Noted  . Asa [aspirin] Other (See Comments) 03/05/2012  . Penicillins Other (See Comments) 03/05/2012  . Sulfa antibiotics Other (See Comments) 03/05/2012    Past Medical History  Diagnosis Date  . Gout   . Cellulitis   . Obesity   . Erosive esophagitis 09/01/2012    NSAID induced.  . Gastric ulcer with hemorrhage 09/02/2012    s/p bleeding control tx. Per Dr. Jena Gauss  . Acute blood loss anemia 09/02/2012    S/p 1 unit rbcs    Past Surgical History  Procedure Laterality Date  . Tonsillectomy    . Esophagogastroduodenoscopy Left 09/01/2012    [id number] esophageal erosions consistent with erosive reflux esophagitis/Pre-pyloric benign-appearing gastric ulcer with bleeding stigmata status post bleeding control therapy as described above    Family History  Problem Relation Age of Onset  . Heart failure Mother     History   Social History  . Marital Status: Single    Spouse Name: N/A    Number of Children: N/A  .  Years of Education: N/A   Occupational History  . Not on file.   Social History Main Topics  . Smoking status: Former Smoker -- 0.50 packs/day for 7 years    Types: Cigarettes  . Smokeless tobacco: Former Neurosurgeon    Types: Snuff  . Alcohol Use: Yes     Comment: very occasional  . Drug Use: No  . Sexual Activity: No   Other Topics Concern  . Not on file   Social History Narrative  . No narrative on file      ROS:  General: Negative for anorexia, weight loss, fever, chills, fatigue, weakness. Eyes: Negative for vision changes.  ENT: Negative for hoarseness, difficulty swallowing , nasal congestion. CV: Negative for chest pain, angina, palpitations, dyspnea on  exertion, peripheral edema.  Respiratory: Negative for dyspnea at rest, dyspnea on exertion, cough, sputum, wheezing.  GI: See history of present illness. GU:  Negative for dysuria, hematuria, urinary incontinence, urinary frequency, nocturnal urination.  MS: Chronic joint pain, lower extremity  Derm: Negative for rash or itching.  Neuro: Negative for weakness, abnormal sensation, seizure, frequent headaches, memory loss, confusion.  Psych: Negative for anxiety, depression, suicidal ideation, hallucinations.  Endo: Negative for unusual weight change.  Heme: Negative for bruising or bleeding. Allergy: Negative for rash or hives.    Physical Examination:  BP 134/88  Pulse 102  Temp(Src) 98.2 F (36.8 C) (Oral)  Ht 5\' 7"  (1.702 m)  Patient refuses to stand on the scales. States he will have too much pain if he gets out of the wheelchair.  General: Well-nourished, well-developed in no acute distress. Accompanied by mother. Head: Normocephalic, atraumatic.   Eyes: Conjunctiva pink, no icterus. Mouth: Oropharyngeal mucosa moist and pink , no lesions erythema or exudate. Neck: Supple without thyromegaly, masses, or lymphadenopathy.  Lungs: Clear to auscultation bilaterally.  Heart: Regular rate and rhythm, no murmurs rubs or gallops.  Abdomen: Bowel sounds are normal, nontender, nondistended, no hepatosplenomegaly or masses, no abdominal bruits or    hernia , no rebound or guarding.   Rectal: Not performed Extremities: No lower extremity edema. No clubbing or deformities.  Neuro: Alert and oriented x 4 , grossly normal neurologically.  Skin: Warm and dry, no rash or jaundice.   Psych: Alert and cooperative, normal mood and affect.  Labs: Lab Results  Component Value Date   WBC 11.7* 11/10/2012   HGB 12.4* 11/10/2012   HCT 39.4 11/10/2012   MCV 85.3 11/10/2012   PLT 288 11/10/2012   Labs from 11/14/2012, white blood cell count 13,000, hemoglobin 12.1, hematocrit 38, MCV 82, platelets  379,000, TSH 2.64, albumin 2.6, sodium 139, potassium 4.1, glucose 76, creatinine 0.62. Hemoglobin A1c 5.7.  Imaging Studies: Dg Chest Portable 1 View  11/07/2012   *RADIOLOGY REPORT*  Clinical Data: Pain; history of gout  PORTABLE CHEST - 1 VIEW  Comparison: None.  Findings: Lungs clear.  Heart size and pulmonary vascularity are normal.  No adenopathy.  No bone lesions.  No pneumothorax.  IMPRESSION: No abnormality noted.   Original Report Authenticated By: Bretta Bang, M.D.

## 2012-12-17 ENCOUNTER — Encounter (HOSPITAL_COMMUNITY): Payer: Self-pay | Admitting: Internal Medicine

## 2013-01-21 ENCOUNTER — Emergency Department (HOSPITAL_COMMUNITY)
Admission: EM | Admit: 2013-01-21 | Discharge: 2013-01-21 | Disposition: A | Discharge status: Home / Self Care | Payer: Medicaid Other | Attending: Emergency Medicine | Admitting: Emergency Medicine

## 2013-01-21 ENCOUNTER — Encounter (HOSPITAL_COMMUNITY): Payer: Self-pay | Admitting: *Deleted

## 2013-01-21 DIAGNOSIS — Z872 Personal history of diseases of the skin and subcutaneous tissue: Secondary | ICD-10-CM | POA: Insufficient documentation

## 2013-01-21 DIAGNOSIS — R300 Dysuria: Secondary | ICD-10-CM | POA: Insufficient documentation

## 2013-01-21 DIAGNOSIS — R34 Anuria and oliguria: Secondary | ICD-10-CM | POA: Insufficient documentation

## 2013-01-21 DIAGNOSIS — Z880 Allergy status to penicillin: Secondary | ICD-10-CM | POA: Insufficient documentation

## 2013-01-21 DIAGNOSIS — R630 Anorexia: Secondary | ICD-10-CM | POA: Insufficient documentation

## 2013-01-21 DIAGNOSIS — F17200 Nicotine dependence, unspecified, uncomplicated: Secondary | ICD-10-CM | POA: Insufficient documentation

## 2013-01-21 DIAGNOSIS — M25569 Pain in unspecified knee: Secondary | ICD-10-CM | POA: Insufficient documentation

## 2013-01-21 DIAGNOSIS — K2970 Gastritis, unspecified, without bleeding: Secondary | ICD-10-CM | POA: Insufficient documentation

## 2013-01-21 DIAGNOSIS — Z8711 Personal history of peptic ulcer disease: Secondary | ICD-10-CM | POA: Insufficient documentation

## 2013-01-21 DIAGNOSIS — M109 Gout, unspecified: Secondary | ICD-10-CM | POA: Insufficient documentation

## 2013-01-21 DIAGNOSIS — Z862 Personal history of diseases of the blood and blood-forming organs and certain disorders involving the immune mechanism: Secondary | ICD-10-CM | POA: Insufficient documentation

## 2013-01-21 DIAGNOSIS — M25579 Pain in unspecified ankle and joints of unspecified foot: Secondary | ICD-10-CM | POA: Insufficient documentation

## 2013-01-21 DIAGNOSIS — Z79899 Other long term (current) drug therapy: Secondary | ICD-10-CM | POA: Insufficient documentation

## 2013-01-21 DIAGNOSIS — R109 Unspecified abdominal pain: Secondary | ICD-10-CM | POA: Insufficient documentation

## 2013-01-21 LAB — CBC WITH DIFFERENTIAL/PLATELET
Basophils Absolute: 0 10*3/uL (ref 0.0–0.1)
Basophils Relative: 0 % (ref 0–1)
Eosinophils Absolute: 0.2 10*3/uL (ref 0.0–0.7)
Eosinophils Relative: 3 % (ref 0–5)
HCT: 39.2 % (ref 39.0–52.0)
Hemoglobin: 12.8 g/dL — ABNORMAL LOW (ref 13.0–17.0)
MCH: 26.9 pg (ref 26.0–34.0)
MCHC: 32.7 g/dL (ref 30.0–36.0)
MCV: 82.5 fL (ref 78.0–100.0)
Monocytes Absolute: 0.7 10*3/uL (ref 0.1–1.0)
Monocytes Relative: 7 % (ref 3–12)
RDW: 15 % (ref 11.5–15.5)

## 2013-01-21 LAB — COMPREHENSIVE METABOLIC PANEL
AST: 17 U/L (ref 0–37)
Albumin: 3.3 g/dL — ABNORMAL LOW (ref 3.5–5.2)
BUN: 6 mg/dL (ref 6–23)
Calcium: 9.6 mg/dL (ref 8.4–10.5)
Chloride: 103 mEq/L (ref 96–112)
Creatinine, Ser: 0.44 mg/dL — ABNORMAL LOW (ref 0.50–1.35)
Total Bilirubin: 0.3 mg/dL (ref 0.3–1.2)
Total Protein: 7.1 g/dL (ref 6.0–8.3)

## 2013-01-21 LAB — URINALYSIS, ROUTINE W REFLEX MICROSCOPIC
Bilirubin Urine: NEGATIVE
Glucose, UA: NEGATIVE mg/dL
Ketones, ur: NEGATIVE mg/dL
Leukocytes, UA: NEGATIVE
Protein, ur: NEGATIVE mg/dL
pH: 8.5 — ABNORMAL HIGH (ref 5.0–8.0)

## 2013-01-21 LAB — LIPASE, BLOOD: Lipase: 20 U/L (ref 11–59)

## 2013-01-21 MED ORDER — OXYCODONE-ACETAMINOPHEN 5-325 MG PO TABS
2.0000 | ORAL_TABLET | Freq: Once | ORAL | Status: AC
  Administered 2013-01-21: 2 via ORAL
  Filled 2013-01-21: qty 2

## 2013-01-21 MED ORDER — GI COCKTAIL ~~LOC~~
30.0000 mL | Freq: Once | ORAL | Status: AC
  Administered 2013-01-21: 30 mL via ORAL
  Filled 2013-01-21: qty 30

## 2013-01-21 NOTE — ED Notes (Signed)
Discharged from [facility] for non-progression with PT  - ran out of pain meds last night.  C/o pain to right knee and bil feet.  Also reports burning with urination.  Pt is non-ambulatory.

## 2013-01-21 NOTE — Progress Notes (Signed)
CM was asked by MD to see pt concerning assistance at home. Pt does not have any outside assistance at home. He has his mother, who assists some, but would benefit from an aide. He would benefit from HHPT, but says he cannot pay for this, and after talking with him it is clear that he did not participate with PT in the SNF. He does not have any income, and has applied for Medicaid, and disability but has  heard from neither. Pt has an answer for everything as to why he cannot do" that". He was in a rehab facility for 2-3 months, but did not become able to walk, only to stand a very short time, because his feet hurt, so he had to leave and go home, since he was making no progress and payer would no longer pay for the stay. Encouraged to continue to check on his disability, and his medicaid applications, but he says he has no phone, and his mother has no phone! When questioned more  he admitted he does have friends with phones he may be able to use. Reminded he may be able to get PCS aide or CAP aide assistance if he gets medicaid approved, and will get a monthly check with disability. Also encouraged to continue to try to keep up exercises he was taught to improve mobility, so he can get back to walking, if possible. Pt was given [employer] patients handout, with Aging, Disability &Transit specially  marked for pt, as a helpful resource for transportation, and possibly for aide services, Meals on Wheels, and other community services, to help him stay in the community. Pt was appreciative of this service

## 2013-01-21 NOTE — ED Provider Notes (Signed)
CSN: [id number]     Arrival date & time 01/21/13  1424 History  This chart was scribed for Flint Melter, MD by Bennett Scrape, ED Scribe. This patient was seen in room APA02/APA02 and the patient's care was started at 3:09 PM.   Chief Complaint  Patient presents with  . Leg Pain  . burning with urination     The history is provided by the patient. No language interpreter was used.   HPI Comments: [name] is a 27 y.o. male who presents to the Emergency Department by ambulance complaining of abdominal pain described as cramping that started last night with associated dysuria described as sharp and decreased urine output that started this morning. Pt states that the symptoms mildly feel like needing to have a bowel movement. He denies eating today reporting a decreased appetite since onset. He denies nausea, emesis and fever as associated symptoms. He has a h/o ulcers but denies similarities.   He has a secondary complaint of right knee pain that is worsened with straightening the leg and bilateral ankle pain that he reports he has experienced intermittently for years secondary to gout and obesity. He was discharged from [facility] where he was receiving PT for ambulation recently due to non-progression with the PT. He states that he now has follow up with [facility] but is unable to get around his house stating that "I have to crawl". He has a wheelchair but states that it is not small enough to fit through his front door. He denies that he has tried to contact social work to improve his situation. He has an appointment with the Health Department in 8 days to continue his follow up. He is currently taking hydrocodone and other medications for his symptoms. He states that he has been unable to fill the uloric and pain medication due to financial restrictions. He has a h/o cellulitis on bilateral legs as well but denies any erythema or swelling.  No PCP   Past Medical  History  Diagnosis Date  . Gout   . Cellulitis   . Obesity   . Erosive esophagitis 09/01/2012    NSAID induced.  . Gastric ulcer with hemorrhage 09/02/2012    s/p bleeding control tx. Per Dr. Jena Gauss  . Acute blood loss anemia 09/02/2012    S/p 1 unit rbcs   Past Surgical History  Procedure Laterality Date  . Tonsillectomy    . Esophagogastroduodenoscopy Left 09/01/2012    [id number] esophageal erosions consistent with erosive reflux esophagitis/Pre-pyloric benign-appearing gastric ulcer with bleeding stigmata status post bleeding control therapy as described above  . Esophagogastroduodenoscopy N/A 12/13/2012    Procedure: ESOPHAGOGASTRODUODENOSCOPY (EGD);  Surgeon: Corbin Ade, MD;  Location: [facility];  Service: Endoscopy;  Laterality: N/A;  11:30   Family History  Problem Relation Age of Onset  . Heart failure Mother    History  Substance Use Topics  . Smoking status: Light Tobacco Smoker -- 0.50 packs/day for 7 years    Types: Cigarettes  . Smokeless tobacco: Current User    Types: Snuff  . Alcohol Use: No     Comment: very occasional    Review of Systems  Constitutional: Negative for fever.  Gastrointestinal: Positive for abdominal pain. Negative for nausea and diarrhea.  Musculoskeletal: Positive for arthralgias (right knee, bilateral ankles).  All other systems reviewed and are negative.    Allergies  Asa; Penicillins; and Sulfa antibiotics  Home Medications   Current Outpatient Rx  Name  Route  Sig  Dispense  Refill  . acetaminophen (TYLENOL) 325 MG tablet   Oral   Take 650 mg by mouth every 6 (six) hours as needed for pain.         Marland Kitchen colchicine 0.6 MG tablet   Oral   Take 0.6 mg by mouth daily.         . Febuxostat (ULORIC) 80 MG TABS   Oral   Take 80 mg by mouth daily.         Marland Kitchen gabapentin (NEURONTIN) 100 MG capsule   Oral   Take 100 mg by mouth 2 (two) times daily.         Marland Kitchen gabapentin (NEURONTIN) 300 MG capsule   Oral   Take 300  mg by mouth at bedtime.         . lansoprazole (PREVACID) 30 MG capsule   Oral   Take 30 mg by mouth 2 (two) times daily.          Marland Kitchen oxyCODONE (OXY IR/ROXICODONE) 5 MG immediate release tablet   Oral   Take 5-10 mg by mouth every 3 (three) hours as needed for pain. Patient takes 1 tablet for mild to moderate pain and 2 tablets for severe pain          Triage Vitals: BP 153/82  Pulse 82  Temp(Src) 98.1 F (36.7 C) (Oral)  Resp 20  Ht 5\' 7"  (1.702 m)  Wt 413 lb (187.336 kg)  BMI 64.67 kg/m2  SpO2 100%  Physical Exam  Nursing note and vitals reviewed. Constitutional: He is oriented to person, place, and time.  Morbidly obese  HENT:  Head: Normocephalic and atraumatic.  Right Ear: External ear normal.  Left Ear: External ear normal.  Eyes: Conjunctivae and EOM are normal. Pupils are equal, round, and reactive to light.  Neck: Normal range of motion and phonation normal. Neck supple.  Cardiovascular: Normal rate, regular rhythm, normal heart sounds and intact distal pulses.   Pulmonary/Chest: Effort normal and breath sounds normal. He exhibits no bony tenderness.  Abdominal: Soft. Normal appearance. There is no tenderness.  Morbidly obese abdomen  Genitourinary:  Mild CVA tenderness bilaterally  Musculoskeletal: Normal range of motion.  No deformity to the right knee, full passive ROM, no effusion, able to completely extend the right knee  Neurological: He is alert and oriented to person, place, and time. No cranial nerve deficit or sensory deficit. He exhibits normal muscle tone. Coordination normal.  Skin: Skin is warm, dry and intact.  Psychiatric: He has a normal mood and affect. His behavior is normal. Judgment and thought content normal.    ED Course  Procedures (including critical care time)  Medications  gi cocktail (Maalox,Lidocaine,Donnatal) (30 mLs Oral Given 01/21/13 1544)  oxyCODONE-acetaminophen (PERCOCET/ROXICET) 5-325 MG per tablet 2 tablet (2 tablets  Oral Given 01/21/13 1721)   Patient Vitals for the past 24 hrs:  BP Temp Temp src Pulse Resp SpO2 Height Weight  01/21/13 1723 134/87 mmHg - - 72 20 100 % - -  01/21/13 1429 153/82 mmHg 98.1 F (36.7 C) Oral 82 20 100 % 5\' 7"  (1.702 m) 413 lb (187.336 kg)    DIAGNOSTIC STUDIES: Oxygen Saturation is 100% on room air, normal by my interpretation.    COORDINATION OF CARE: 3:14 PM-Discussed treatment plan which includes GI cocktail, CBC panel, CMP and UA with pt at bedside and pt agreed to plan.   4:30 PM- Discussed pt's case with the health care  manager who states that there are no other resources to give the pt. She advised to have the pt follow up with the Health Department as scheduled.   5:09 PM-Pt rechecked and still complains of pain with medications listed above. Informed pt of radiology and lab work results. Discussed discharge plan which includes Maalox and pain medication with pt and pt agreed to plan. Will wrap pt's right knee in an ACE bandage. Also advised pt to follow up as needed and pt agreed. Addressed symptoms to return for with pt. Pt expressed concern over getting home.  Nursing Notes Reviewed/ Care Coordinated Applicable Imaging Reviewed Interpretation of Laboratory Data incorporated into ED treatment  Results for orders placed during the hospital encounter of 01/21/13  URINALYSIS, ROUTINE W REFLEX MICROSCOPIC      Result Value Range   Color, Urine YELLOW  YELLOW   APPearance CLEAR  CLEAR   Specific Gravity, Urine 1.020  1.005 - 1.030   pH 8.5 (*) 5.0 - 8.0   Glucose, UA NEGATIVE  NEGATIVE mg/dL   Hgb urine dipstick NEGATIVE  NEGATIVE   Bilirubin Urine NEGATIVE  NEGATIVE   Ketones, ur NEGATIVE  NEGATIVE mg/dL   Protein, ur NEGATIVE  NEGATIVE mg/dL   Urobilinogen, UA 0.2  0.0 - 1.0 mg/dL   Nitrite NEGATIVE  NEGATIVE   Leukocytes, UA NEGATIVE  NEGATIVE  CBC WITH DIFFERENTIAL      Result Value Range   WBC 9.1  4.0 - 10.5 K/uL   RBC 4.75  4.22 - 5.81 MIL/uL    Hemoglobin 12.8 (*) 13.0 - 17.0 g/dL   HCT 16.1  09.6 - 04.5 %   MCV 82.5  78.0 - 100.0 fL   MCH 26.9  26.0 - 34.0 pg   MCHC 32.7  30.0 - 36.0 g/dL   RDW 40.9  81.1 - 91.4 %   Platelets 241  150 - 400 K/uL   Neutrophils Relative % 66  43 - 77 %   Neutro Abs 6.0  1.7 - 7.7 K/uL   Lymphocytes Relative 24  12 - 46 %   Lymphs Abs 2.2  0.7 - 4.0 K/uL   Monocytes Relative 7  3 - 12 %   Monocytes Absolute 0.7  0.1 - 1.0 K/uL   Eosinophils Relative 3  0 - 5 %   Eosinophils Absolute 0.2  0.0 - 0.7 K/uL   Basophils Relative 0  0 - 1 %   Basophils Absolute 0.0  0.0 - 0.1 K/uL  COMPREHENSIVE METABOLIC PANEL      Result Value Range   Sodium 142  135 - 145 mEq/L   Potassium 3.3 (*) 3.5 - 5.1 mEq/L   Chloride 103  96 - 112 mEq/L   CO2 28  19 - 32 mEq/L   Glucose, Bld 97  70 - 99 mg/dL   BUN 6  6 - 23 mg/dL   Creatinine, Ser 7.82 (*) 0.50 - 1.35 mg/dL   Calcium 9.6  8.4 - 95.6 mg/dL   Total Protein 7.1  6.0 - 8.3 g/dL   Albumin 3.3 (*) 3.5 - 5.2 g/dL   AST 17  0 - 37 U/L   ALT 21  0 - 53 U/L   Alkaline Phosphatase 57  39 - 117 U/L   Total Bilirubin 0.3  0.3 - 1.2 mg/dL   GFR calc non Af Amer >90  >90 mL/min   GFR calc Af Amer >90  >90 mL/min  LIPASE, BLOOD  Result Value Range   Lipase 20  11 - 59 U/L   No results found.   MDM   1. Gastritis   2. Knee pain, unspecified laterality     Nonspecific complaints. Likely gastritis causing abdominal pain. No evidence for hemodynamic instability, vascular compromise or suspected, serious intra-abdominal infection. He is stable for discharge    Plan: Home Medications-Maalox; Home Treatments- use ACE wrap as needed to improve knee pain, fill prescriptions, take Maalox as needed ; return here if the recommended treatment, does not improve the symptoms; Recommended follow up-with Health Department as planned   I personally performed the services described in this documentation, which was scribed in my presence. The recorded information  has been reviewed and is accurate.       Flint Melter, MD 01/22/13 [phone]

## 2013-01-21 NOTE — ED Notes (Signed)
EMS called to transport pt home - will send truck when available.  Pt notified.

## 2013-01-21 NOTE — Progress Notes (Signed)
Pt was assisted with medications in May, therefore will not qualify again for 7 more months

## 2013-01-21 NOTE — ED Notes (Signed)
Pt reports being non-ambulatory x 4 months due to bil foot pain from unknown cause.  Admitted to [facility] for PT with no progression so was discharged home.  Pt reports crawls to bathroom to use bathroom.  Denies any new injuries.  nad noted.

## 2013-01-27 ENCOUNTER — Encounter (HOSPITAL_COMMUNITY): Payer: Self-pay | Admitting: Emergency Medicine

## 2013-01-27 ENCOUNTER — Emergency Department (HOSPITAL_COMMUNITY): Payer: Medicaid Other

## 2013-01-27 ENCOUNTER — Emergency Department (HOSPITAL_COMMUNITY)
Admission: EM | Admit: 2013-01-27 | Discharge: 2013-01-28 | Disposition: A | Discharge status: Home / Self Care | Payer: Medicaid Other | Attending: Emergency Medicine | Admitting: Emergency Medicine

## 2013-01-27 DIAGNOSIS — F17200 Nicotine dependence, unspecified, uncomplicated: Secondary | ICD-10-CM | POA: Insufficient documentation

## 2013-01-27 DIAGNOSIS — R109 Unspecified abdominal pain: Secondary | ICD-10-CM | POA: Insufficient documentation

## 2013-01-27 LAB — CBC
HCT: 43.7 % (ref 39.0–52.0)
RBC: 5.28 MIL/uL (ref 4.22–5.81)
RDW: 15 % (ref 11.5–15.5)
WBC: 8.1 10*3/uL (ref 4.0–10.5)

## 2013-01-27 MED ORDER — IOHEXOL 300 MG/ML  SOLN
50.0000 mL | Freq: Once | INTRAMUSCULAR | Status: AC | PRN
  Administered 2013-01-27: 50 mL via ORAL

## 2013-01-27 MED ORDER — SODIUM CHLORIDE 0.9 % IV SOLN
INTRAVENOUS | Status: DC
  Administered 2013-01-27: 23:00:00 via INTRAVENOUS

## 2013-01-27 MED ORDER — FENTANYL CITRATE 0.05 MG/ML IJ SOLN
50.0000 ug | INTRAMUSCULAR | Status: DC | PRN
  Administered 2013-01-28 (×3): 50 ug via INTRAVENOUS
  Filled 2013-01-27 (×3): qty 2

## 2013-01-27 MED ORDER — ONDANSETRON HCL 4 MG/2ML IJ SOLN
4.0000 mg | Freq: Once | INTRAMUSCULAR | Status: AC
  Administered 2013-01-28: 4 mg via INTRAVENOUS
  Filled 2013-01-27: qty 2

## 2013-01-27 NOTE — ED Provider Notes (Signed)
CSN: [id number]     Arrival date & time 01/27/13  2205 History   This chart was scribed for Steven Nielsen, MD by Carl Best, ED Scribe. This patient was seen in room APA10/APA10 and the patient's care was started at 11:10 PM.     Chief Complaint  Patient presents with  . Abdominal Pain    The history is provided by the patient. No language interpreter was used.   HPI Comments: KELE BARTHELEMY is a 27 y.o. male who presents to the Emergency Department complaining of constant RLQ and new RUQ abdominal pain.  The patient states that the pain is his RLQ started a week ago and describes the pain as "twisting".  He states that the pain in his RUQ started today and describes it as sharp.  He rates the pain overall at an 8/10.  The patient states that he came to the ED a week ago when the pain in his RLQ first started.  The patient denies hematochezia and melena as associated symptoms.  He lists diarrhea as an associated symptom and states that his urine is brown in color.  The patient denies having a history of kidney disease.  The patient states that he is currently taking medication to treat his gout.    Tender RUQ and RLQ Past Medical History  Diagnosis Date  . Gout   . Cellulitis   . Obesity   . Erosive esophagitis 09/01/2012    NSAID induced.  . Gastric ulcer with hemorrhage 09/02/2012    s/p bleeding control tx. Per Dr. Jena Gauss  . Acute blood loss anemia 09/02/2012    S/p 1 unit rbcs   Past Surgical History  Procedure Laterality Date  . Tonsillectomy    . Esophagogastroduodenoscopy Left 09/01/2012    [id number] esophageal erosions consistent with erosive reflux esophagitis/Pre-pyloric benign-appearing gastric ulcer with bleeding stigmata status post bleeding control therapy as described above  . Esophagogastroduodenoscopy N/A 12/13/2012    Procedure: ESOPHAGOGASTRODUODENOSCOPY (EGD);  Surgeon: Corbin Ade, MD;  Location: [facility];  Service: Endoscopy;  Laterality: N/A;  11:30    Family History  Problem Relation Age of Onset  . Heart failure Mother    History  Substance Use Topics  . Smoking status: Light Tobacco Smoker -- 0.50 packs/day for 7 years    Types: Cigarettes  . Smokeless tobacco: Current User    Types: Snuff  . Alcohol Use: No     Comment: very occasional    Review of Systems  Gastrointestinal: Positive for abdominal pain (RLQ and RUQ) and diarrhea.  All other systems reviewed and are negative.    Allergies  Asa; Penicillins; and Sulfa antibiotics  Home Medications   Current Outpatient Rx  Name  Route  Sig  Dispense  Refill  . acetaminophen (TYLENOL) 325 MG tablet   Oral   Take 650 mg by mouth every 6 (six) hours as needed for pain.         Marland Kitchen colchicine 0.6 MG tablet   Oral   Take 0.6 mg by mouth daily.         . Febuxostat (ULORIC) 80 MG TABS   Oral   Take 80 mg by mouth daily.         Marland Kitchen gabapentin (NEURONTIN) 100 MG capsule   Oral   Take 100 mg by mouth 2 (two) times daily.         Marland Kitchen gabapentin (NEURONTIN) 300 MG capsule   Oral   Take 300 mg  by mouth at bedtime.         . lansoprazole (PREVACID) 30 MG capsule   Oral   Take 30 mg by mouth 2 (two) times daily.          Marland Kitchen oxyCODONE (OXY IR/ROXICODONE) 5 MG immediate release tablet   Oral   Take 5-10 mg by mouth every 3 (three) hours as needed for pain. Patient takes 1 tablet for mild to moderate pain and 2 tablets for severe pain          Triage Vitals: BP 136/64  Pulse 100  Temp(Src) 98.1 F (36.7 C)  Resp 18  Ht 5\' 7"  (1.702 m)  Wt 413 lb (187.336 kg)  BMI 64.67 kg/m2  SpO2 100%  Physical Exam  Nursing note and vitals reviewed. Constitutional: He is oriented to person, place, and time. He appears well-developed and well-nourished. No distress.  HENT:  Head: Normocephalic and atraumatic.  Eyes: EOM are normal. No scleral icterus.  Neck: Neck supple. No tracheal deviation present.  Cardiovascular: Normal rate.   Pulmonary/Chest: Effort  normal. No respiratory distress.  Abdominal: Soft. There is tenderness (RUQ and RLQ). There is no rebound and no guarding.  Morbidly obese  Musculoskeletal: Normal range of motion.  Neurological: He is alert and oriented to person, place, and time.  Skin: Skin is warm and dry.  Psychiatric: He has a normal mood and affect. His behavior is normal.    ED Course  Procedures (including critical care time)  DIAGNOSTIC STUDIES: Oxygen Saturation is 100% on room air, normal by my interpretation.    COORDINATION OF CARE: 11:14 PM- Discussed starting the patient on an IV and pain medication with the patient and the patient agreed to the treatment plan.       Labs Review Labs Reviewed  COMPREHENSIVE METABOLIC PANEL - Abnormal; Notable for the following:    Glucose, Bld 100 (*)    Albumin 3.4 (*)    Total Bilirubin 0.2 (*)    All other components within normal limits  CBC  LIPASE, BLOOD  URINALYSIS, ROUTINE W REFLEX MICROSCOPIC     IV fentanyl/ zofran, IVFs, NPO  12:26 AM PT too large for CT scanner at [facility]. D/w DR Norlene Campbell at [facility] accepts in transfer to the ER for CT scan. D/w DR Grace Isaac (RAD) at [facility], aware of plan.    MDM  Dx: Abdominal pain IV narcotics pain control Labs VS and nurses notes reviewed Transferred to [facility] for advanced imaging to evaluate symptoms  I personally performed the services described in this documentation, which was scribed in my presence. The recorded information has been reviewed and is accurate.     Steven Nielsen, MD 01/28/13 [phone]

## 2013-01-27 NOTE — ED Notes (Signed)
Pt c/o lower abd pain. Pt states he has not had a bm today and has not urinated since last night.

## 2013-01-27 NOTE — ED Notes (Signed)
Pt returns to ED secondary to upper right abdominal pain x 1 week.  Pt reports he has worsening pain today, unable to eat since this morning and has not been able to void since last night.  EDP aware of said urinary retention. NAD noted at this time.

## 2013-01-28 ENCOUNTER — Emergency Department (HOSPITAL_COMMUNITY): Payer: Medicaid Other

## 2013-01-28 ENCOUNTER — Encounter (HOSPITAL_COMMUNITY): Payer: Self-pay | Admitting: Radiology

## 2013-01-28 LAB — URINALYSIS, ROUTINE W REFLEX MICROSCOPIC
Bilirubin Urine: NEGATIVE
Glucose, UA: NEGATIVE mg/dL
Ketones, ur: NEGATIVE mg/dL
pH: 6 (ref 5.0–8.0)

## 2013-01-28 LAB — COMPREHENSIVE METABOLIC PANEL
AST: 16 U/L (ref 0–37)
Albumin: 3.4 g/dL — ABNORMAL LOW (ref 3.5–5.2)
Alkaline Phosphatase: 57 U/L (ref 39–117)
BUN: 13 mg/dL (ref 6–23)
CO2: 27 mEq/L (ref 19–32)
Chloride: 102 mEq/L (ref 96–112)
GFR calc non Af Amer: >90 mL/min (ref >90)
Potassium: 3.5 mEq/L (ref 3.5–5.1)
Total Bilirubin: 0.2 mg/dL — ABNORMAL LOW (ref 0.3–1.2)

## 2013-01-28 MED ORDER — DICYCLOMINE HCL 10 MG PO CAPS
20.0000 mg | ORAL_CAPSULE | Freq: Once | ORAL | Status: AC
  Administered 2013-01-28: 20 mg via ORAL
  Filled 2013-01-28: qty 2

## 2013-01-28 MED ORDER — IOHEXOL 300 MG/ML  SOLN
100.0000 mL | Freq: Once | INTRAMUSCULAR | Status: AC | PRN
  Administered 2013-01-28: 100 mL via INTRAVENOUS

## 2013-01-28 MED ORDER — DICYCLOMINE HCL 20 MG PO TABS: 20.0000 mg | ORAL_TABLET | Freq: Four times a day (QID) | ORAL | Status: DC | PRN

## 2013-01-28 NOTE — ED Notes (Signed)
Patient transported to CT and return  

## 2013-01-28 NOTE — ED Provider Notes (Signed)
Patient received after transfer from Dr. Dierdre Highman at [facility] Emergency room.  Patient unable to receive CT scan at [facility] secondary to weight.  Patient has had about a week of twisting lower right abdominal pain.  Today, onset of sharp, severe, right upper abdominal pain.  Patient has been able to eat and drink, but has not had an appetite.  No nausea or vomiting.  He's been having diarrhea.  Patient has history of gout, morbid obesity, gastric ulcer, and chronic bilateral lower extremity pain.  Plan is to complete CT scan, ordered at [facility].  Await results for further treatment.  Results for orders placed during the hospital encounter of 01/27/13  CBC      Result Value Range   WBC 8.1  4.0 - 10.5 K/uL   RBC 5.28  4.22 - 5.81 MIL/uL   Hemoglobin 14.4  13.0 - 17.0 g/dL   HCT 78.2  95.6 - 21.3 %   MCV 82.8  78.0 - 100.0 fL   MCH 27.3  26.0 - 34.0 pg   MCHC 33.0  30.0 - 36.0 g/dL   RDW 08.6  57.8 - 46.9 %   Platelets 261  150 - 400 K/uL  COMPREHENSIVE METABOLIC PANEL      Result Value Range   Sodium 142  135 - 145 mEq/L   Potassium 3.5  3.5 - 5.1 mEq/L   Chloride 102  96 - 112 mEq/L   CO2 27  19 - 32 mEq/L   Glucose, Bld 100 (*) 70 - 99 mg/dL   BUN 13  6 - 23 mg/dL   Creatinine, Ser 6.29  0.50 - 1.35 mg/dL   Calcium 9.5  8.4 - 52.8 mg/dL   Total Protein 7.2  6.0 - 8.3 g/dL   Albumin 3.4 (*) 3.5 - 5.2 g/dL   AST 16  0 - 37 U/L   ALT 19  0 - 53 U/L   Alkaline Phosphatase 57  39 - 117 U/L   Total Bilirubin 0.2 (*) 0.3 - 1.2 mg/dL   GFR calc non Af Amer >90  >90 mL/min   GFR calc Af Amer >90  >90 mL/min  LIPASE, BLOOD      Result Value Range   Lipase 23  11 - 59 U/L  URINALYSIS, ROUTINE W REFLEX MICROSCOPIC      Result Value Range   Color, Urine YELLOW  YELLOW   APPearance CLEAR  CLEAR   Specific Gravity, Urine 1.025  1.005 - 1.030   pH 6.0  5.0 - 8.0   Glucose, UA NEGATIVE  NEGATIVE mg/dL   Hgb urine dipstick NEGATIVE  NEGATIVE   Bilirubin Urine NEGATIVE  NEGATIVE    Ketones, ur NEGATIVE  NEGATIVE mg/dL   Protein, ur NEGATIVE  NEGATIVE mg/dL   Urobilinogen, UA 1.0  0.0 - 1.0 mg/dL   Nitrite NEGATIVE  NEGATIVE   Leukocytes, UA NEGATIVE  NEGATIVE   Ct Abdomen Pelvis W Contrast  01/28/2013   CLINICAL DATA:  Abdominal pain.  EXAM: CT ABDOMEN AND PELVIS WITH CONTRAST  TECHNIQUE: Multidetector CT imaging of the abdomen and pelvis was performed using the standard protocol following bolus administration of intravenous contrast.  CONTRAST:  OMNIPAQUE IOHEXOL 300 MG/ML  SOLN  COMPARISON:  None.  FINDINGS: BODY WALL: Prominent bilateral inguinal lymph nodes, the largest on the left at 1.9 cm in diameter. Enlarged inguinal nodes were also noted on sonography 05/03/ 2013. Prominent subcutaneous veins in the anterior abdominal wall, without visible venous  stenosis.  LOWER CHEST:  Mediastinum: Unremarkable.  Lungs/pleura: No consolidation.  ABDOMEN/PELVIS:  Liver: Probable mild diffuse fatty infiltration. No focal abnormality.  Biliary: No evidence of biliary obstruction or stone.  Pancreas: Unremarkable when accounting for artifactually increased density over the pancreatic head  Spleen: Unremarkable.  Adrenals: 2.2 cm left adrenal nodule, -10 Hounsfield units despite enhanced technique. No associated gas or definite continuity with the neighboring stomach.  Kidneys and ureters: No hydronephrosis or stone.  Bladder: Unremarkable.  Bowel: No obstruction. Appendix not clearly identified. No pericecal inflammatory change.  Retroperitoneum: Mild enlargement of external chain lymph nodes, in continuity with mild inguinal nodal enlargement.  Peritoneum: No free fluid or gas.  Reproductive: Unremarkable.  Vascular: No acute abnormality.  OSSEOUS: No acute abnormalities. No suspicious lytic or blastic lesions.  IMPRESSION: 1. No evidence of acute intra-abdominal disease. 2. 2 cm left adrenal mass, likely adenoma.   Electronically Signed   By: Tiburcio Pea M.D.   On: 01/28/2013 03:09       No specific cause for pt's symptoms.  Labs normal.  Will d/c to f/u with gi and pcp.  Will start patient on bentyl.  [name], MD 01/28/13 [id number]

## 2013-01-28 NOTE — ED Notes (Signed)
Discuss current fentanyl order with Dr Norlene Campbell, states to continue order

## 2013-01-28 NOTE — ED Notes (Signed)
Pt arrived from [facility] via [facility].

## 2013-01-28 NOTE — ED Notes (Signed)
Patient finished drinking oral CT contrast.

## 2013-02-03 ENCOUNTER — Emergency Department (HOSPITAL_COMMUNITY)
Admission: EM | Admit: 2013-02-03 | Discharge: 2013-02-03 | Disposition: A | Discharge status: Home / Self Care | Payer: Medicaid Other | Attending: Emergency Medicine | Admitting: Emergency Medicine

## 2013-02-03 ENCOUNTER — Encounter (HOSPITAL_COMMUNITY): Payer: Self-pay | Admitting: Emergency Medicine

## 2013-02-03 DIAGNOSIS — F17200 Nicotine dependence, unspecified, uncomplicated: Secondary | ICD-10-CM | POA: Insufficient documentation

## 2013-02-03 DIAGNOSIS — M25473 Effusion, unspecified ankle: Secondary | ICD-10-CM | POA: Insufficient documentation

## 2013-02-03 DIAGNOSIS — M25571 Pain in right ankle and joints of right foot: Secondary | ICD-10-CM

## 2013-02-03 DIAGNOSIS — Z8719 Personal history of other diseases of the digestive system: Secondary | ICD-10-CM | POA: Insufficient documentation

## 2013-02-03 DIAGNOSIS — Z79899 Other long term (current) drug therapy: Secondary | ICD-10-CM | POA: Insufficient documentation

## 2013-02-03 DIAGNOSIS — R21 Rash and other nonspecific skin eruption: Secondary | ICD-10-CM | POA: Insufficient documentation

## 2013-02-03 DIAGNOSIS — M25579 Pain in unspecified ankle and joints of unspecified foot: Secondary | ICD-10-CM | POA: Insufficient documentation

## 2013-02-03 DIAGNOSIS — M25476 Effusion, unspecified foot: Secondary | ICD-10-CM | POA: Insufficient documentation

## 2013-02-03 DIAGNOSIS — Z862 Personal history of diseases of the blood and blood-forming organs and certain disorders involving the immune mechanism: Secondary | ICD-10-CM | POA: Insufficient documentation

## 2013-02-03 DIAGNOSIS — Z880 Allergy status to penicillin: Secondary | ICD-10-CM | POA: Insufficient documentation

## 2013-02-03 LAB — CBC WITH DIFFERENTIAL/PLATELET
Basophils Relative: 0 % (ref 0–1)
Eosinophils Absolute: 0.2 10*3/uL (ref 0.0–0.7)
Hemoglobin: 13.8 g/dL (ref 13.0–17.0)
Lymphocytes Relative: 25 % (ref 12–46)
MCH: 27.4 pg (ref 26.0–34.0)
MCHC: 33 g/dL (ref 30.0–36.0)
MCV: 82.9 fL (ref 78.0–100.0)
Monocytes Relative: 10 % (ref 3–12)
Neutrophils Relative %: 63 % (ref 43–77)
Platelets: 268 10*3/uL (ref 150–400)

## 2013-02-03 LAB — URIC ACID: Uric Acid, Serum: 10.1 mg/dL — ABNORMAL HIGH (ref 4.0–7.8)

## 2013-02-03 MED ORDER — OXYCODONE-ACETAMINOPHEN 5-325 MG PO TABS: 1.0000 | ORAL_TABLET | ORAL | Status: DC | PRN

## 2013-02-03 MED ORDER — PREDNISONE 50 MG PO TABS
60.0000 mg | ORAL_TABLET | Freq: Once | ORAL | Status: AC
  Administered 2013-02-03: 60 mg via ORAL
  Filled 2013-02-03 (×2): qty 1

## 2013-02-03 MED ORDER — OXYCODONE-ACETAMINOPHEN 5-325 MG PO TABS
2.0000 | ORAL_TABLET | Freq: Once | ORAL | Status: AC
  Administered 2013-02-03: 2 via ORAL
  Filled 2013-02-03: qty 2

## 2013-02-03 MED ORDER — PREDNISONE (PAK) 10 MG PO TABS: 10.0000 mg | ORAL_TABLET | Freq: Every day | ORAL | Status: DC

## 2013-02-03 NOTE — ED Notes (Signed)
Pt reports he has no way to get home except EMS.

## 2013-02-03 NOTE — ED Provider Notes (Signed)
CSN: [id number]     Arrival date & time 02/03/13  1536 History   First MD Initiated Contact with Patient 02/03/13 1646     Chief Complaint  Patient presents with  . Gout   (Consider location/radiation/quality/duration/timing/severity/associated sxs/prior Treatment) Patient is a 27 y.o. male presenting with ankle pain. The history is provided by the patient.  Ankle Pain Location:  Ankle Injury: no   Ankle location:  L ankle and R ankle Pain details:    Quality:  Burning, aching and shooting   Radiates to:  L leg and R leg   Severity:  Severe   Onset quality:  Gradual   Duration:  24 hours   Timing:  Constant   Progression:  Worsening Chronicity:  Recurrent Dislocation: no   Foreign body present:  No foreign bodies Prior injury to area:  No Worsened by:  Activity Ineffective treatments:  None tried Associated symptoms: no fever    [name] is a 27 y.o. morbidly obese male who presents to the ED with bilateral ankle pain with swelling that started last night . He states that he has had gout in the past. He does not walk due to his weight and crawls around his house. When he has severe pain he calls EMS and they bring him to the hospital and admit him and give him his medications. He is not taking any of his medications because he ran out and can not afford them. Has Rx at home for all his medications.  Past Medical History  Diagnosis Date  . Gout   . Cellulitis   . Obesity   . Erosive esophagitis 09/01/2012    NSAID induced.  . Gastric ulcer with hemorrhage 09/02/2012    s/p bleeding control tx. Per Dr. Jena Gauss  . Acute blood loss anemia 09/02/2012    S/p 1 unit rbcs   Past Surgical History  Procedure Laterality Date  . Tonsillectomy    . Esophagogastroduodenoscopy Left 09/01/2012    WUJ:WJXBJY esophageal erosions consistent with erosive reflux esophagitis/Pre-pyloric benign-appearing gastric ulcer with bleeding stigmata status post bleeding control therapy as  described above  . Esophagogastroduodenoscopy N/A 12/13/2012    Procedure: ESOPHAGOGASTRODUODENOSCOPY (EGD);  Surgeon: Corbin Ade, MD;  Location: [facility] ENDO SUITE;  Service: Endoscopy;  Laterality: N/A;  11:30   Family History  Problem Relation Age of Onset  . Heart failure Mother    History  Substance Use Topics  . Smoking status: Light Tobacco Smoker -- 0.25 packs/day for 7 years    Types: Cigarettes  . Smokeless tobacco: Current User    Types: Snuff  . Alcohol Use: No    Review of Systems  Constitutional: Negative for fever and chills.  HENT: Negative for congestion.   Eyes: Negative for pain.  Respiratory: Negative for cough.   Cardiovascular: Negative for chest pain.  Gastrointestinal: Negative for nausea and vomiting.  Musculoskeletal: Gait problem: unable to ambulate due to morbid obesity.  Skin: Positive for rash.  Neurological: Negative for headaches.  Psychiatric/Behavioral: The patient is not nervous/anxious.     Allergies  Asa; Penicillins; and Sulfa antibiotics  Home Medications   Current Outpatient Rx  Name  Route  Sig  Dispense  Refill  . acetaminophen (TYLENOL) 325 MG tablet   Oral   Take 650 mg by mouth every 6 (six) hours as needed for pain.         Marland Kitchen colchicine 0.6 MG tablet   Oral   Take 0.6 mg by mouth daily.         Marland Kitchen  dicyclomine (BENTYL) 20 MG tablet   Oral   Take 1 tablet (20 mg total) by mouth every 6 (six) hours as needed (for abdominal cramping).   20 tablet   0   . Febuxostat (ULORIC) 80 MG TABS   Oral   Take 80 mg by mouth daily.         Marland Kitchen gabapentin (NEURONTIN) 100 MG capsule   Oral   Take 100 mg by mouth 2 (two) times daily.         Marland Kitchen gabapentin (NEURONTIN) 300 MG capsule   Oral   Take 300 mg by mouth at bedtime.         . lansoprazole (PREVACID) 30 MG capsule   Oral   Take 30 mg by mouth 2 (two) times daily.          Marland Kitchen oxyCODONE (OXY IR/ROXICODONE) 5 MG immediate release tablet   Oral   Take 5-10 mg by  mouth every 3 (three) hours as needed for pain. Patient takes 1 tablet for mild to moderate pain and 2 tablets for severe pain          BP 133/92  Pulse 89  Temp(Src) 97.9 F (36.6 C) (Oral)  Resp 18  Ht 5\' 7"  (1.702 m)  Wt 413 lb (187.336 kg)  BMI 64.67 kg/m2  SpO2 100% Physical Exam  Nursing note and vitals reviewed. Constitutional: He is oriented to person, place, and time.  Morbidly obese  HENT:  Head: Atraumatic.  Eyes: Conjunctivae and EOM are normal.  Neck: Neck supple.  Cardiovascular: Normal rate.   Pulmonary/Chest: Effort normal.  Musculoskeletal:  Bilateral ankles with swelling and tender with palpation.  Pedal pulses equal bilateral, adequate circulation, good touch sensation.   Neurological: He is alert and oriented to person, place, and time. No cranial nerve deficit or sensory deficit.  Patient unable to ambulate due to morbid obesity  Skin: Skin is warm and dry.  Psychiatric: He has a normal mood and affect. His behavior is normal.   Results for orders placed during the hospital encounter of 02/03/13 (from the past 24 hour(s))  CBC WITH DIFFERENTIAL     Status: None   Collection Time    02/03/13  5:44 PM      Result Value Range   WBC 8.7  4.0 - 10.5 K/uL   RBC 5.04  4.22 - 5.81 MIL/uL   Hemoglobin 13.8  13.0 - 17.0 g/dL   HCT 16.1  09.6 - 04.5 %   MCV 82.9  78.0 - 100.0 fL   MCH 27.4  26.0 - 34.0 pg   MCHC 33.0  30.0 - 36.0 g/dL   RDW 40.9  81.1 - 91.4 %   Platelets 268  150 - 400 K/uL   Neutrophils Relative % 63  43 - 77 %   Neutro Abs 5.5  1.7 - 7.7 K/uL   Lymphocytes Relative 25  12 - 46 %   Lymphs Abs 2.2  0.7 - 4.0 K/uL   Monocytes Relative 10  3 - 12 %   Monocytes Absolute 0.8  0.1 - 1.0 K/uL   Eosinophils Relative 2  0 - 5 %   Eosinophils Absolute 0.2  0.0 - 0.7 K/uL   Basophils Relative 0  0 - 1 %   Basophils Absolute 0.0  0.0 - 0.1 K/uL    ED Course: Dr. Judd Lien in to evaluate the patient and discuss with the patient need for PCP and to  fill medications. Patient does  not meet criteria for admission.  Procedures   MDM  27 y.o. male with bilateral ankle pain x 24 hours. Hx of gout.  Discussed with the patient need for PCP and follow up plan of care. All questioned fully answered. Patient to return home via EMS due to his body habitus it is his only means of transportation.     Medication List    TAKE these medications       oxyCODONE-acetaminophen 5-325 MG per tablet  Commonly known as:  ROXICET  Take 1 tablet by mouth every 4 (four) hours as needed for pain.     predniSONE 10 MG tablet  Commonly known as:  STERAPRED UNI-PAK  Take 1 tablet (10 mg total) by mouth daily. Starting 02/04/2013 take 5 tablets followed by 4, 3, 2, 1      ASK your doctor about these medications       acetaminophen 325 MG tablet  Commonly known as:  TYLENOL  Take 650 mg by mouth every 6 (six) hours as needed for pain.     colchicine 0.6 MG tablet  Take 0.6 mg by mouth daily.     dicyclomine 20 MG tablet  Commonly known as:  BENTYL  Take 1 tablet (20 mg total) by mouth every 6 (six) hours as needed (for abdominal cramping).     gabapentin 100 MG capsule  Commonly known as:  NEURONTIN  Take 100 mg by mouth 2 (two) times daily.     gabapentin 300 MG capsule  Commonly known as:  NEURONTIN  Take 300 mg by mouth at bedtime.     lansoprazole 30 MG capsule  Commonly known as:  PREVACID  Take 30 mg by mouth 2 (two) times daily.     oxyCODONE 5 MG immediate release tablet  Commonly known as:  Oxy IR/ROXICODONE  Take 5-10 mg by mouth every 3 (three) hours as needed for pain. Patient takes 1 tablet for mild to moderate pain and 2 tablets for severe pain     ULORIC 80 MG Tabs  Generic drug:  Febuxostat  Take 80 mg by mouth daily.            [place], [place] 02/03/13 2104

## 2013-02-03 NOTE — ED Notes (Signed)
Pt awaiting transport via EMS to his residence.

## 2013-02-03 NOTE — ED Notes (Signed)
Pt seen and evaluated by EDPa for initial assessment. 

## 2013-02-03 NOTE — ED Provider Notes (Signed)
Medical screening examination/treatment/procedure(s) were conducted as a shared visit with non-physician practitioner(s) and myself.  I personally evaluated the patient during the encounter.  Patient is a 27 year old male with past medical history significant for morbid obesity and gout. He presents today with pain in both feet that he states is typical of his gout pain. He denies any fevers or chills and he denies any new injury or trauma.  On exam the patient is afebrile and vitals are stable. He is awake alert and appropriate. Both feet are tender to palpation along the arches. There is no obvious redness or warmth.  This patient presents with complaints that I suspect are more plantar fasciitis related rather than gout. Our plan was to treat with prednisone as an anti-inflammatory and discharged to home. The patient became emotional stating that he was unable to go home and needed to be admitted. Said he could not afford his medications he needed to stay here so we could give them to him. We had a discussion regarding the situation and in the and he was discharged. We have encouraged him to find a way to afford these medications as we could not justify an inpatient hospitalization for this condition for this reason. He was not very happy about this.  Geoffery Lyons, MD 02/03/13 [phone]

## 2013-02-03 NOTE — ED Notes (Signed)
Patient is resting comfortably. 

## 2013-02-03 NOTE — ED Notes (Signed)
Started having gout symptoms in bilateral feet and ankles.  Unable to move ankles. Tender to palpation.

## 2013-02-05 ENCOUNTER — Encounter (HOSPITAL_COMMUNITY): Payer: Self-pay | Admitting: Emergency Medicine

## 2013-02-05 ENCOUNTER — Emergency Department (HOSPITAL_COMMUNITY)
Admission: EM | Admit: 2013-02-05 | Discharge: 2013-02-06 | Disposition: A | Discharge status: Home / Self Care | Payer: Medicaid Other | Attending: Emergency Medicine | Admitting: Emergency Medicine

## 2013-02-05 DIAGNOSIS — K208 Other esophagitis without bleeding: Secondary | ICD-10-CM | POA: Insufficient documentation

## 2013-02-05 DIAGNOSIS — Z880 Allergy status to penicillin: Secondary | ICD-10-CM | POA: Insufficient documentation

## 2013-02-05 DIAGNOSIS — K254 Chronic or unspecified gastric ulcer with hemorrhage: Secondary | ICD-10-CM | POA: Insufficient documentation

## 2013-02-05 DIAGNOSIS — F17200 Nicotine dependence, unspecified, uncomplicated: Secondary | ICD-10-CM | POA: Insufficient documentation

## 2013-02-05 DIAGNOSIS — Z872 Personal history of diseases of the skin and subcutaneous tissue: Secondary | ICD-10-CM | POA: Insufficient documentation

## 2013-02-05 DIAGNOSIS — G8929 Other chronic pain: Secondary | ICD-10-CM | POA: Insufficient documentation

## 2013-02-05 DIAGNOSIS — M109 Gout, unspecified: Secondary | ICD-10-CM | POA: Insufficient documentation

## 2013-02-05 DIAGNOSIS — Z862 Personal history of diseases of the blood and blood-forming organs and certain disorders involving the immune mechanism: Secondary | ICD-10-CM | POA: Insufficient documentation

## 2013-02-05 DIAGNOSIS — M722 Plantar fascial fibromatosis: Secondary | ICD-10-CM | POA: Insufficient documentation

## 2013-02-05 DIAGNOSIS — IMO0002 Reserved for concepts with insufficient information to code with codable children: Secondary | ICD-10-CM | POA: Insufficient documentation

## 2013-02-05 DIAGNOSIS — Z79899 Other long term (current) drug therapy: Secondary | ICD-10-CM | POA: Insufficient documentation

## 2013-02-05 MED ORDER — OXYCODONE-ACETAMINOPHEN 5-325 MG PO TABS
2.0000 | ORAL_TABLET | Freq: Once | ORAL | Status: AC
  Administered 2013-02-05: 2 via ORAL
  Filled 2013-02-05: qty 2

## 2013-02-05 MED ORDER — PREDNISONE 50 MG PO TABS
60.0000 mg | ORAL_TABLET | Freq: Once | ORAL | Status: AC
  Administered 2013-02-05: 60 mg via ORAL
  Filled 2013-02-05 (×2): qty 1

## 2013-02-05 NOTE — ED Notes (Signed)
Pt arrived by EMS w/ complaint of leg pain. Pt was seen in the ER today & given scripts, pt states he is unable to afford, was told they were on the $4. List at [facility] but when they called they were not. Per EMS pt has no way to get back home tonight.

## 2013-02-06 NOTE — ED Notes (Signed)
EMS called for return ride home.

## 2013-02-06 NOTE — ED Provider Notes (Signed)
CSN: [id number]     Arrival date & time 02/05/13  2334 History   First MD Initiated Contact with Patient 02/05/13 2344     Chief Complaint  Patient presents with  . Leg Pain   (Consider location/radiation/quality/duration/timing/severity/associated sxs/prior Treatment) HPI History provided by patient. Brought in by EMS for ongoing persistent unchanged lower extremity pain. Patient suffers from morbid obesity, polyarthritis and chronic pains. He was evaluated here 2 days ago with the same complaints and diagnosed with plantar fasciitis. He is not able to afford his medications. He brings prescription for Percocet and prednisone with him. He has a wheelchair at home but is not helping. He still has severe pain with ambulating. No fall or trauma. No redness or new swelling. No fevers or chills. Pain is mostly at the plantar aspect of both feet they are equally 10/10 pain. No calf pain or swelling. No joint pain or redness. Past Medical History  Diagnosis Date  . Gout   . Cellulitis   . Obesity   . Erosive esophagitis 09/01/2012    NSAID induced.  . Gastric ulcer with hemorrhage 09/02/2012    s/p bleeding control tx. Per Dr. Jena Gauss  . Acute blood loss anemia 09/02/2012    S/p 1 unit rbcs   Past Surgical History  Procedure Laterality Date  . Tonsillectomy    . Esophagogastroduodenoscopy Left 09/01/2012    WUJ:WJXBJY esophageal erosions consistent with erosive reflux esophagitis/Pre-pyloric benign-appearing gastric ulcer with bleeding stigmata status post bleeding control therapy as described above  . Esophagogastroduodenoscopy N/A 12/13/2012    Procedure: ESOPHAGOGASTRODUODENOSCOPY (EGD);  Surgeon: Corbin Ade, MD;  Location: [facility];  Service: Endoscopy;  Laterality: N/A;  11:30   Family History  Problem Relation Age of Onset  . Heart failure Mother    History  Substance Use Topics  . Smoking status: Light Tobacco Smoker -- 0.25 packs/day for 7 years    Types: Cigarettes  .  Smokeless tobacco: Current User    Types: Snuff  . Alcohol Use: No    Review of Systems  Constitutional: Negative for fever and chills.  Eyes: Negative for visual disturbance.  Respiratory: Negative for shortness of breath.   Cardiovascular: Negative for chest pain.  Gastrointestinal: Negative for vomiting and abdominal pain.  Genitourinary: Negative for dysuria and flank pain.  Musculoskeletal: Negative for back pain, neck pain and neck stiffness.  Skin: Negative for rash and wound.  Neurological: Negative for weakness and numbness.  All other systems reviewed and are negative.    Allergies  Asa; Penicillins; and Sulfa antibiotics  Home Medications   Current Outpatient Rx  Name  Route  Sig  Dispense  Refill  . acetaminophen (TYLENOL) 325 MG tablet   Oral   Take 650 mg by mouth every 6 (six) hours as needed for pain.         Marland Kitchen colchicine 0.6 MG tablet   Oral   Take 0.6 mg by mouth daily.         . Febuxostat (ULORIC) 80 MG TABS   Oral   Take 80 mg by mouth daily.         Marland Kitchen gabapentin (NEURONTIN) 100 MG capsule   Oral   Take 100 mg by mouth 2 (two) times daily.         Marland Kitchen gabapentin (NEURONTIN) 300 MG capsule   Oral   Take 300 mg by mouth at bedtime.         . lansoprazole (PREVACID) 30 MG capsule  Oral   Take 30 mg by mouth 2 (two) times daily.          Marland Kitchen oxyCODONE (OXY IR/ROXICODONE) 5 MG immediate release tablet   Oral   Take 5-10 mg by mouth every 3 (three) hours as needed for pain. Patient takes 1 tablet for mild to moderate pain and 2 tablets for severe pain         . dicyclomine (BENTYL) 20 MG tablet   Oral   Take 1 tablet (20 mg total) by mouth every 6 (six) hours as needed (for abdominal cramping).   20 tablet   0   . oxyCODONE-acetaminophen (ROXICET) 5-325 MG per tablet   Oral   Take 1 tablet by mouth every 4 (four) hours as needed for pain.   30 tablet   0   . predniSONE (STERAPRED UNI-PAK) 10 MG tablet   Oral   Take 1 tablet  (10 mg total) by mouth daily. Starting 02/04/2013 take 5 tablets followed by 4, 3, 2, 1   15 tablet   0    BP 137/93  Pulse 87  Temp(Src) 98.3 F (36.8 C) (Oral)  Resp 20  Ht 5\' 7"  (1.702 m)  Wt 413 lb (187.336 kg)  BMI 64.67 kg/m2  SpO2 100% Physical Exam  Constitutional: He is oriented to person, place, and time. He appears well-developed and well-nourished.  HENT:  Head: Normocephalic and atraumatic.  Eyes: EOM are normal. Pupils are equal, round, and reactive to light.  Neck: Neck supple.  Cardiovascular: Normal rate, regular rhythm and intact distal pulses.   Pulmonary/Chest: Effort normal and breath sounds normal. No respiratory distress.  Abdominal:  Morbidly obese, soft nontender  Musculoskeletal:  Bilateral lower extremities without erythema, no asymmetric swelling. No joint erythema or increased warmth to touch. Exquisitely tender over both plantar surfaces with distal neurovascular intact. Skin intact throughout  Neurological: He is alert and oriented to person, place, and time.  Skin: Skin is warm and dry.    ED Course  Procedures (including critical care time)  Pain medications provided: Percocet and prednisone  Previous records reviewed, recently evaluated in emergency department for the same with prescriptions and precautions and instructions provided.  Patient is followed by a Child psychotherapist, he is in the process of trying to get on Medicaid, he was given extensive counseling regarding his condition today and overall health condition. He is aware that he needs to lose a significant amount of weight. He is aware that he needs to try conservative measures at home for his plantar fasciitis and that anti-inflammatories may help with his condition.  He is aware that prednisone is a medication on the fall as [facility].  He reports that he has exhausted his resources for help with medications with [facility] this year.    There is no indication for admission at  this time, blood work or emergent imaging. He is stable for discharge home with instructions and precautions provided. Pain improving with medications.   MDM   1. Plantar fasciitis, bilateral    Medications provided Previous records reviewed Vital signs nursing notes reviewed and considered    Sunnie Nielsen, MD 02/06/13 [phone]

## 2013-02-06 NOTE — ED Notes (Signed)
Spoke to EDP about writing a script for 5 mg tabs that are on the low cost [facility] list, script written for pt.

## 2013-02-06 NOTE — ED Notes (Signed)
Pt carried back home by EMS. 

## 2013-02-20 ENCOUNTER — Other Ambulatory Visit: Payer: Self-pay

## 2013-02-21 ENCOUNTER — Emergency Department (HOSPITAL_COMMUNITY): Payer: Medicaid Other

## 2013-02-21 ENCOUNTER — Emergency Department (HOSPITAL_COMMUNITY)
Admission: EM | Admit: 2013-02-21 | Discharge: 2013-02-21 | Disposition: A | Discharge status: Home / Self Care | Payer: Medicaid Other | Attending: Emergency Medicine | Admitting: Emergency Medicine

## 2013-02-21 ENCOUNTER — Encounter (HOSPITAL_COMMUNITY): Payer: Self-pay | Admitting: Emergency Medicine

## 2013-02-21 DIAGNOSIS — Z862 Personal history of diseases of the blood and blood-forming organs and certain disorders involving the immune mechanism: Secondary | ICD-10-CM | POA: Insufficient documentation

## 2013-02-21 DIAGNOSIS — R52 Pain, unspecified: Secondary | ICD-10-CM | POA: Insufficient documentation

## 2013-02-21 DIAGNOSIS — Z8719 Personal history of other diseases of the digestive system: Secondary | ICD-10-CM | POA: Insufficient documentation

## 2013-02-21 DIAGNOSIS — Z880 Allergy status to penicillin: Secondary | ICD-10-CM | POA: Insufficient documentation

## 2013-02-21 DIAGNOSIS — G8929 Other chronic pain: Secondary | ICD-10-CM | POA: Insufficient documentation

## 2013-02-21 DIAGNOSIS — M109 Gout, unspecified: Secondary | ICD-10-CM | POA: Insufficient documentation

## 2013-02-21 DIAGNOSIS — Z872 Personal history of diseases of the skin and subcutaneous tissue: Secondary | ICD-10-CM | POA: Insufficient documentation

## 2013-02-21 DIAGNOSIS — F17200 Nicotine dependence, unspecified, uncomplicated: Secondary | ICD-10-CM | POA: Insufficient documentation

## 2013-02-21 LAB — CBC WITH DIFFERENTIAL/PLATELET
Basophils Absolute: 0 10*3/uL (ref 0.0–0.1)
Basophils Relative: 0 % (ref 0–1)
Eosinophils Relative: 4 % (ref 0–5)
Lymphocytes Relative: 20 % (ref 12–46)
MCHC: 32.9 g/dL (ref 30.0–36.0)
MCV: 82.6 fL (ref 78.0–100.0)
Neutro Abs: 6.1 10*3/uL (ref 1.7–7.7)
Platelets: 242 10*3/uL (ref 150–400)
RDW: 14.2 % (ref 11.5–15.5)
WBC: 8.8 10*3/uL (ref 4.0–10.5)

## 2013-02-21 LAB — URIC ACID: Uric Acid, Serum: 11.2 mg/dL — ABNORMAL HIGH (ref 4.0–7.8)

## 2013-02-21 MED ORDER — COLCHICINE 0.6 MG PO TABS
0.6000 mg | ORAL_TABLET | Freq: Once | ORAL | Status: AC
  Administered 2013-02-21: 0.6 mg via ORAL
  Filled 2013-02-21: qty 1

## 2013-02-21 MED ORDER — OXYCODONE-ACETAMINOPHEN 10-325 MG PO TABS: 1.0000 | ORAL_TABLET | ORAL | Status: DC | PRN

## 2013-02-21 MED ORDER — HYDROMORPHONE HCL PF 2 MG/ML IJ SOLN
INTRAMUSCULAR | Status: AC
  Administered 2013-02-21: 2 mg
  Filled 2013-02-21: qty 1

## 2013-02-21 MED ORDER — COLCHICINE 0.6 MG PO TABS
1.2000 mg | ORAL_TABLET | Freq: Once | ORAL | Status: AC
  Administered 2013-02-21: 1.2 mg via ORAL
  Filled 2013-02-21: qty 2

## 2013-02-21 MED ORDER — OXYCODONE-ACETAMINOPHEN 5-325 MG PO TABS
2.0000 | ORAL_TABLET | Freq: Once | ORAL | Status: AC
  Administered 2013-02-21: 2 via ORAL
  Filled 2013-02-21: qty 2

## 2013-02-21 MED ORDER — HYDROMORPHONE HCL PF 1 MG/ML IJ SOLN: 2.0000 mg | Freq: Once | INTRAMUSCULAR | Status: DC

## 2013-02-21 MED ORDER — INDOMETHACIN 25 MG PO CAPS
50.0000 mg | ORAL_CAPSULE | Freq: Once | ORAL | Status: DC
  Filled 2013-02-21: qty 2

## 2013-02-21 NOTE — ED Notes (Addendum)
Both feet hurt and are swollen since last seen here 10/23.  Dx   With plantar fascititis.  No fever, has felt chills.  Toes are cool to touch and discolored. Pt says he has stayed in the bed for the last 5 days due to pain in his feet.

## 2013-02-21 NOTE — ED Notes (Signed)
Pt ready for discharge, awaiting transport by [facility].

## 2013-02-21 NOTE — ED Provider Notes (Signed)
CSN: [id number]     Arrival date & time 02/21/13  1518 History   First MD Initiated Contact with Patient 02/21/13 1722     Chief Complaint  Patient presents with  . Foot Pain   (Consider location/radiation/quality/duration/timing/severity/associated sxs/prior Treatment) HPI Comments: TAB RYLEE is a 27 y.o. Male presenting by EMS with acute on chronic bilateral foot pain which he associats with his history of gout.  He has a history of gout along with morbid obesity.  At his last visit here on 10/22 he had similar pain, but located more at his plantar fascia bilaterally which he states has not improved either,  But tonights pain is more consistent with his gouty flares involving the mtp joint of his left foot.  Simply placing a sheet on the area is painful.  He uses a wheelchair at home but has mostly stayed in bed the past 5 days due to pain.  He has taken tylenol without relief of pain.  He has been prescribed colchicine (by his rehab physician from an admission in July for this problem) but has not filled due to cost.  He denies fever and chills.   The history is provided by the patient.    Past Medical History  Diagnosis Date  . Gout   . Cellulitis   . Obesity   . Erosive esophagitis 09/01/2012    NSAID induced.  . Gastric ulcer with hemorrhage 09/02/2012    s/p bleeding control tx. Per Dr. Jena Gauss  . Acute blood loss anemia 09/02/2012    S/p 1 unit rbcs   Past Surgical History  Procedure Laterality Date  . Tonsillectomy    . Esophagogastroduodenoscopy Left 09/01/2012    WUJ:[id number] esophageal erosions consistent with erosive reflux esophagitis/Pre-pyloric benign-appearing gastric ulcer with bleeding stigmata status post bleeding control therapy as described above  . Esophagogastroduodenoscopy N/A 12/13/2012    Procedure: ESOPHAGOGASTRODUODENOSCOPY (EGD);  Surgeon: Corbin Ade, MD;  Location: [facility];  Service: Endoscopy;  Laterality: N/A;  11:30   Family History   Problem Relation Age of Onset  . Heart failure Mother    History  Substance Use Topics  . Smoking status: Light Tobacco Smoker -- 0.25 packs/day for 7 years    Types: Cigarettes  . Smokeless tobacco: Current User    Types: Snuff  . Alcohol Use: No    Review of Systems  Constitutional: Negative for fever and chills.  Musculoskeletal: Positive for arthralgias and joint swelling. Negative for myalgias.  Neurological: Negative for weakness and numbness.    Allergies  Asa; Penicillins; and Sulfa antibiotics  Home Medications   Current Outpatient Rx  Name  Route  Sig  Dispense  Refill  . acetaminophen (TYLENOL) 325 MG tablet   Oral   Take 650 mg by mouth every 6 (six) hours as needed for pain.          BP 119/73  Pulse 80  Temp(Src) 97.8 F (36.6 C) (Oral)  Resp 20  Ht 5\' 7"  (1.702 m)  Wt 413 lb (187.336 kg)  BMI 64.67 kg/m2  SpO2 100% Physical Exam  Constitutional: He appears well-developed.  Morbidly obese   HENT:  Head: Atraumatic.  Eyes: Conjunctivae are normal.  Neck: Neck supple.  Cardiovascular: Normal rate.   Pulses equal bilaterally  Pulmonary/Chest: Effort normal. No respiratory distress.  Musculoskeletal: He exhibits tenderness.  ttp to light palpation left great toe and mtp.  Erythema at this site,  Yet remainder of toes and foot are cool.  Dorsalis pedis pulses equal and full.  Less than 3 sec cap refill.  Changes of toes c/w tinea pedis. Tender at plantar fascia insertion and across foot arch.    Neurological: He is alert. He has normal strength. He displays normal reflexes. No sensory deficit.  Equal strength  Skin: Skin is warm and dry.  Psychiatric: He has a normal mood and affect.    ED Course  Procedures (including critical care time) Labs Review Labs Reviewed  URIC ACID - Abnormal; Notable for the following:    Uric Acid, Serum 11.2 (*)    All other components within normal limits  CBC WITH DIFFERENTIAL   Imaging Review Dg Foot  Complete Left  02/21/2013   CLINICAL DATA:  Diffuse foot pain, no known injury  EXAM: LEFT FOOT - COMPLETE 3+ VIEW  COMPARISON:  None.  FINDINGS: Three views of the left foot submitted. No acute fracture or subluxation. Diffuse osteopenia. Dorsal spurring tarsal region. Mild soft tissue swelling. Mild degenerative changes tibiotalar joint.  IMPRESSION: No acute fracture or subluxation. Diffuse osteopenia. Dorsal spurring tarsal region. Mild degenerative changes tibiotalar joint. Soft tissue swelling.   Electronically Signed   By: Natasha Mead M.D.   On: 02/21/2013 18:12    EKG Interpretation   None       MDM   1. Gout attack    Pt was treated with colchicine while here,  1.2 mg po x 1,  Then 0.6 mg 1 hour later. He was given a dilaudid 2 mg IM injection with pain improvement.   He was given oxycodone for home use.  Encouraged that he needs to f/u with a pcp, referrals given.   He has applied for disability and medicaid, reporting it should be approved next week.    Patients labs and/or radiological studies were viewed and considered during the medical decision making and disposition process.  Uric acid level elevated c/w gouty flare.       Burgess Amor, PA-C 02/22/13 0005

## 2013-02-21 NOTE — ED Notes (Signed)
Dilaudid given on cabinet override, no link available to link med to order.

## 2013-02-23 NOTE — ED Provider Notes (Signed)
Medical screening examination/treatment/procedure(s) were performed by non-physician practitioner and as supervising physician I was immediately available for consultation/collaboration.  EKG Interpretation   None         Nthony Lefferts L Mathias Bogacki, MD 02/23/13 1910 

## 2013-06-25 ENCOUNTER — Emergency Department (HOSPITAL_COMMUNITY)
Admission: EM | Admit: 2013-06-25 | Discharge: 2013-06-25 | Disposition: A | Discharge status: Home / Self Care | Payer: Medicaid Other | Attending: Emergency Medicine | Admitting: Emergency Medicine

## 2013-06-25 ENCOUNTER — Encounter (HOSPITAL_COMMUNITY): Payer: Self-pay | Admitting: Emergency Medicine

## 2013-06-25 DIAGNOSIS — F17200 Nicotine dependence, unspecified, uncomplicated: Secondary | ICD-10-CM | POA: Insufficient documentation

## 2013-06-25 DIAGNOSIS — M109 Gout, unspecified: Secondary | ICD-10-CM

## 2013-06-25 DIAGNOSIS — E669 Obesity, unspecified: Secondary | ICD-10-CM | POA: Insufficient documentation

## 2013-06-25 DIAGNOSIS — Z8719 Personal history of other diseases of the digestive system: Secondary | ICD-10-CM | POA: Insufficient documentation

## 2013-06-25 DIAGNOSIS — Z8669 Personal history of other diseases of the nervous system and sense organs: Secondary | ICD-10-CM | POA: Insufficient documentation

## 2013-06-25 DIAGNOSIS — Z872 Personal history of diseases of the skin and subcutaneous tissue: Secondary | ICD-10-CM | POA: Insufficient documentation

## 2013-06-25 DIAGNOSIS — Z880 Allergy status to penicillin: Secondary | ICD-10-CM | POA: Insufficient documentation

## 2013-06-25 DIAGNOSIS — Z862 Personal history of diseases of the blood and blood-forming organs and certain disorders involving the immune mechanism: Secondary | ICD-10-CM | POA: Insufficient documentation

## 2013-06-25 DIAGNOSIS — Z79899 Other long term (current) drug therapy: Secondary | ICD-10-CM | POA: Insufficient documentation

## 2013-06-25 MED ORDER — PREDNISONE 20 MG PO TABS: 60.0000 mg | ORAL_TABLET | Freq: Every day | ORAL | Status: DC

## 2013-06-25 MED ORDER — LANSOPRAZOLE 30 MG PO CPDR: 30.0000 mg | DELAYED_RELEASE_CAPSULE | Freq: Two times a day (BID) | ORAL | Status: DC

## 2013-06-25 MED ORDER — COLCHICINE 0.6 MG PO TABS: 0.6000 mg | ORAL_TABLET | Freq: Every day | ORAL | Status: DC

## 2013-06-25 MED ORDER — OXYCODONE-ACETAMINOPHEN 5-325 MG PO TABS
2.0000 | ORAL_TABLET | Freq: Once | ORAL | Status: AC
  Administered 2013-06-25: 2 via ORAL
  Filled 2013-06-25: qty 2

## 2013-06-25 MED ORDER — OXYCODONE-ACETAMINOPHEN 10-325 MG PO TABS: 1.0000 | ORAL_TABLET | ORAL | Status: DC | PRN

## 2013-06-25 NOTE — ED Notes (Signed)
Pt c/o pain, redness and swelling in right hand and right foot. Pt has hx of gout.

## 2013-06-25 NOTE — Discharge Instructions (Signed)
Gout °Gout is an inflammatory arthritis caused by a buildup of uric acid crystals in the joints. Uric acid is a chemical that is normally present in the blood. When the level of uric acid in the blood is too high it can form crystals that deposit in your joints and tissues. This causes joint redness, soreness, and swelling (inflammation). Repeat attacks are common. Over time, uric acid crystals can form into masses (tophi) near a joint, destroying bone and causing disfigurement. Gout is treatable and often preventable. °CAUSES  °The disease begins with elevated levels of uric acid in the blood. Uric acid is produced by your body when it breaks down a naturally found substance called purines. Certain foods you eat, such as meats and fish, contain high amounts of purines. Causes of an elevated uric acid level include: °· Being passed down from parent to child (heredity). °· Diseases that cause increased uric acid production (such as obesity, psoriasis, and certain cancers). °· Excessive alcohol use. °· Diet, especially diets rich in meat and seafood. °· Medicines, including certain cancer-fighting medicines (chemotherapy), water pills (diuretics), and aspirin. °· Chronic kidney disease. The kidneys are no longer able to remove uric acid well. °· Problems with metabolism. °Conditions strongly associated with gout include: °· Obesity. °· High blood pressure. °· High cholesterol. °· Diabetes. °Not everyone with elevated uric acid levels gets gout. It is not understood why some people get gout and others do not. Surgery, joint injury, and eating too much of certain foods are some of the factors that can lead to gout attacks. °SYMPTOMS  °· An attack of gout comes on quickly. It causes intense pain with redness, swelling, and warmth in a joint. °· Fever can occur. °· Often, only one joint is involved. Certain joints are more commonly involved: °· Base of the big toe. °· Knee. °· Ankle. °· Wrist. °· Finger. °Without  treatment, an attack usually goes away in a few days to weeks. Between attacks, you usually will not have symptoms, which is different from many other forms of arthritis. °DIAGNOSIS  °Your caregiver will suspect gout based on your symptoms and exam. In some cases, tests may be recommended. The tests may include: °· Blood tests. °· Urine tests. °· X-rays. °· Joint fluid exam. This exam requires a needle to remove fluid from the joint (arthrocentesis). Using a microscope, gout is confirmed when uric acid crystals are seen in the joint fluid. °TREATMENT  °There are two phases to gout treatment: treating the sudden onset (acute) attack and preventing attacks (prophylaxis). °· Treatment of an Acute Attack. °· Medicines are used. These include anti-inflammatory medicines or steroid medicines. °· An injection of steroid medicine into the affected joint is sometimes necessary. °· The painful joint is rested. Movement can worsen the arthritis. °· You may use warm or cold treatments on painful joints, depending which works best for you. °· Treatment to Prevent Attacks. °· If you suffer from frequent gout attacks, your caregiver may advise preventive medicine. These medicines are started after the acute attack subsides. These medicines either help your kidneys eliminate uric acid from your body or decrease your uric acid production. You may need to stay on these medicines for a very long time. °· The early phase of treatment with preventive medicine can be associated with an increase in acute gout attacks. For this reason, during the first few months of treatment, your caregiver may also advise you to take medicines usually used for acute gout treatment. Be sure you   understand your caregiver's directions. Your caregiver may make several adjustments to your medicine dose before these medicines are effective.  Discuss dietary treatment with your caregiver or dietitian. Alcohol and drinks high in sugar and fructose and foods  such as meat, poultry, and seafood can increase uric acid levels. Your caregiver or dietician can advise you on drinks and foods that should be limited. HOME CARE INSTRUCTIONS   Do not take aspirin to relieve pain. This raises uric acid levels.  Only take over-the-counter or prescription medicines for pain, discomfort, or fever as directed by your caregiver.  Rest the joint as much as possible. When in bed, keep sheets and blankets off painful areas.  Keep the affected joint raised (elevated).  Apply warm or cold treatments to painful joints. Use of warm or cold treatments depends on which works best for you.  Use crutches if the painful joint is in your leg.  Drink enough fluids to keep your urine clear or pale yellow. This helps your body get rid of uric acid. Limit alcohol, sugary drinks, and fructose drinks.  Follow your dietary instructions. Pay careful attention to the amount of protein you eat. Your daily diet should emphasize fruits, vegetables, whole grains, and fat-free or low-fat milk products. Discuss the use of coffee, vitamin C, and cherries with your caregiver or dietician. These may be helpful in lowering uric acid levels.  Maintain a healthy body weight. SEEK MEDICAL CARE IF:   You develop diarrhea, vomiting, or any side effects from medicines.  You do not feel better in 24 hours, or you are getting worse. SEEK IMMEDIATE MEDICAL CARE IF:   Your joint becomes suddenly more tender, and you have chills or a fever. MAKE SURE YOU:   Understand these instructions.  Will watch your condition.  Will get help right away if you are not doing well or get worse. Document Released: 03/31/2000 Document Revised: 07/29/2012 Document Reviewed: 11/15/2011 [facility] Patient Information 2014 [place], [place].    Emergency Department Resource Guide 1) Find a Doctor and Pay Out of Pocket Although you won't have to find out who is covered by your insurance plan, it is a good idea to  ask around and get recommendations. You will then need to call the office and see if the doctor you have chosen will accept you as a new patient and what types of options they offer for patients who are self-pay. Some doctors offer discounts or will set up payment plans for their patients who do not have insurance, but you will need to ask so you aren't surprised when you get to your appointment.  2) Contact Your Local Health Department Not all health departments have doctors that can see patients for sick visits, but many do, so it is worth a call to see if yours does. If you don't know where your local health department is, you can check in your phone book. The CDC also has a tool to help you locate your state's health department, and many state websites also have listings of all of their local health departments.  3) Find a Walk-in Clinic If your illness is not likely to be very severe or complicated, you may want to try a walk in clinic. These are popping up all over the country in pharmacies, drugstores, and shopping centers. They're usually staffed by nurse practitioners or physician assistants that have been trained to treat common illnesses and complaints. They're usually fairly quick and inexpensive. However, if you have serious medical issues  or chronic medical problems, these are probably not your best option.  No Primary Care Doctor: - Call Health Connect at  [phone] - they can help you locate a primary care doctor that  accepts your insurance, provides certain services, etc. - Physician Referral Service- [phone]  Chronic Pain Problems: Organization         Address  Phone   Notes  [facility]  [phone] Patients need to be referred by their primary care doctor.   Medication Assistance: Organization         Address  Phone   Notes  [facility] [address] [place], [place] [address] [phone] --Must be a  resident of [facility] -- Must have NO insurance coverage whatsoever (no Medicaid/ Medicare, etc.) -- The pt. MUST have a primary care doctor that directs their care regularly and follows them in the community   [facility]ist  [phone]   [employer]  [phone]    Agencies that provide inexpensive medical care: Organization         Address  Phone   Notes  [facility]  [phone]   [facility]    [phone]   [facility] [address] [place], [place] [address] [phone]   [facility] [address], [place] [phone]   [facility]anned [facility]    [phone]) [phone]   [facility]    [phone]   [facility]  [address], [place] Phone:  [phone], Fax:  [phone]) [phone] Hours of Operation:  9 am - 6 pm, M-F.  Also accepts Medicaid/Medicare and self-pay.  [facility]  [address], [place] Phone: [phone], Fax: [phone]) [phone]. Hours of Operation:  8:30 am - 5:30 pm, M-F.  Also accepts Medicaid and self-pay.  [facility] [place] [address], [place] Phone: [phone]) [phone]   [facility] [address], [place], [place] [phone], Ext. [phone] Mondays & Thursdays: 7-9 AM.  First 15 patients are seen on a first come, first serve basis.    Medicaid-accepting [facility] Providers:  Organization         Address  Phone   Notes  [facility] [address], [place] [phone] Also accepts self-pay patients.  [facility] [address], [place]  [phone]   [facility] [address], [place] [phone]   [facility] [address] [place] [address], [place] [phone]) [phone]   Renaye RakersVeita Bland [address], [place]   [phone] Only accepts  [place] Access [place] patients after they have their name applied to their card.   Self-Pay (no insurance) in [facility] [place]:  Organization         Address  Phone   Notes  Sickle Cell Patients, [facility] [address], [place] [phone]) [phone]   [facility] [address], [place] [phone]   [facility] [place]  [address], [place] [phone]   [facility]/Dr. Osei-Bonsu  [address] [place] [address], [place] or [phone], [place] [phone] Phone number for both [place] and [place] locations is the same.  [facility] [address] [place] [phone]   [facility] [address], [place] or [address] [phone]   [facility] [address] [place] [phone], phone; [phone], fax Sees patients 1st and 3rd Saturday of every month.  Must not qualify for public or private insurance (i.e. Medicaid, Medicare, [place] Health Choice, Veterans' Benefits)  Household income should be no more than 200% of the poverty level The clinic cannot treat you if you are pregnant or think you are pregnant  Sexually transmitted diseases are not treated at the clinic.    Dental Care: Organization         Address  Phone  Notes  [facility] Department of [facility] [phone] Accepts children up to age 58 who are enrolled in [place] or [place]; pregnant women with a Medicaid card; and children who have applied for Medicaid or [place] Health Choice, but were declined, whose parents can pay a reduced fee at time of service.  [facility] of [facility]  [address], [place] [phone] Accepts children up to age 41 who are enrolled in [place] or [place]; pregnant women  with a Medicaid card; and children who have applied for Medicaid or [place] Health Choice, but were declined, whose parents can pay a reduced fee at time of service.  [facility]  [place] [phone] Patients are seen by appointment only. Walk-ins are not accepted. [place] will see patients 15 years of age and older. Monday - Tuesday (8am-5pm) Most Wednesdays (8:30-5pm) $30 per visit, cash only  [facility] Adult [facility] Access PROGRAM  [address], [facility] [phone] Patients are seen by appointment only. Walk-ins are not accepted. [place] will see patients 32 years of age and older. One Wednesday Evening (Monthly: Volunteer Based).  $30 per visit, cash only  [place]  [phone] for adults; Children under age 42, call Graduate Pediatric Dentistry at [phone]. Children aged 73-14, please call [phone] to request a pediatric application.  Dental services are provided in all areas of dental care including fillings, crowns and bridges, complete and partial dentures, implants, gum treatment, root canals, and extractions. Preventive care is also provided. Treatment is provided to both adults and children. Patients are selected via a lottery and there is often a waiting list.   [facility] [address] [place]  [phone] [email or web address]   [facility] [address] [place], [place] [phone], Ext. [phone] Second and Fourth Thursday of each month, opens at 6:30 AM; Clinic ends at 9 AM.  Patients are seen on a first-come first-served basis, and a limited number are seen during each clinic.   [facility]  [address] [place], [place] [phone]   Eligibility Requirements You must have lived in [place], [place], or [place] counties for at least the last three months.   You cannot be eligible for state or federal sponsored [employer], including [employer], [place], or [employer].   You generally cannot be eligible for healthcare insurance through your employer.    How to apply: Eligibility screenings are held every Tuesday and Wednesday afternoon from 1:00 pm until 4:00 pm. You do not need an appointment for the interview!  [facility] [address], [place], [place] [phone]   °[place] [facility]  [phone]   °[place]  336-703-3100   °[facility]  336-570-6415   ° °Behavioral Health Resources in the Community: °Intensive Outpatient Programs °Organization         Address  Phone  Notes  °[facility] [address], [place], [place] 336-878-6098   °[facility] [address], [place], [place] 336-832-9800   °[facility] [address], [place], [place] ° 336-882-2125   °[facility] [address],  °[place], [place] 1-800-853-5163 or 336-641-4981   °Substance Abuse Resources °Organization         Address  Phone  Notes  °[facility]  336-882-2125   °[facility]  336-784-9470   °[facility]  336-285-9073   °[facility]  336-845-3988   °Residential & Outpatient Substance Abuse Program  1-800-659-3381   °Psychological Services °Organization         Address  Phone  Notes  °[facility]  336- 832-9600   °[facility]  336- 378-7881   °[facility] [address], [place] 1-800-853-5163 or 336-641-4981   ° °Mobile Crisis Teams °Organization         Address  Phone  Notes  °[facility], Mobile Crisis Care Unit  1-877-626-1772   °Assertive °[facility]otherapeutic Services ° [address] [place], [place] 336-834-9664   °Sharon DeEsch [address] °[place] 336-554-5454   ° °Self-Help/Support Groups °Organization         Address  Phone             Notes  °[facility] [place] - variety of support groups  336- 373-1402 Call for more information  °Narcotics Anonymous (NA), [facility] [address], °[place]  2 meetings at this location  ° °Residential Treatment Programs °Organization         Address  Phone  Notes  °[facility] [address],    °[place]  1-866-801-8205   °[facility] ° [address], [place], [place] 704-293-8524   °[facility] [address], [place] 336-845-3988 Admissions: 8am-3pm M-F  °[facility] [address]    °[place], [place] 336-841-1104   °[facility] [address], [place], [place] 336-379-7146   °[facility] [address]  °[place], [place] 336-285-9073   °[facility] - Intensive Outpatient [address], [place], [place] 336-852-3033   °[facility] ([facility] [address]  °[place], [place] 1-877-615-2722 or 336-784-9470   °[facility] ([facility]) [address] [place], [place] 336-227-7417 Accepts Medicaid  °[facility] [address]  °[place] 1-800-659-3381 Substance Abuse/Addiction Treatment  ° °[place] Behavioral Health Resources °Organization         Address  Phone  Notes  °[facility]  (888) 581-9988   °Dhanya Bogle Brannon, PhD [address] [place], [place]   (336) 349-5553 or (336) 951-0000   °[facility]   [address] °[place], [place] (336) 349-4454   °[facility] [address], [place],  (336) 342-8316 Insurance/Medicaid/sponsorship through [facility]  °[facility] [address]                                      [place], [place] [phone] Therapy/tele-psych/case  [facility] [address]   [place], [place] [phone]    Dr. Lolly Mustache  [phone]   [facility] [place]  [employer] [facility] Dept. 1) [address],  [place] 2) [address] [place] 3)  [address], [place] [phone]  662-640-3655   [facility] Child Abuse Hotline 443-849-5621 or 215-119-3206 (After Hours)         Take the medicines prescribed,  Elevation and heat will also help reduce this flare more quickly.  Hold the colchicine - start taking this once your flare up is resolved.

## 2013-06-27 NOTE — ED Provider Notes (Signed)
CSN: [id number]     Arrival date & time 06/25/13  1538 History   First MD Initiated Contact with Patient 06/25/13 1637     Chief Complaint  Patient presents with  . Gout     (Consider location/radiation/quality/duration/timing/severity/associated sxs/prior Treatment) HPI Comments: [name] is a 28 y.o. Morbidly obese gentleman with a history of  Fairly frequent episodes of gout presenting with a flare in his right proximal index finger and also reports persistent in his bilateral feet, right greater than left, but states the foot pain is a chronic condition also attributable to peripheral neuropathy (he is not diabetic). He denies fevers or chills, nausea, vomiting and denies injury in the affected parts.  He has taken tylenol with no relief.  He is to avoid nsaids given history of gastric ulcers, but has successfully used short courses of prednisone with a ppi without side effects and adequate relief of the gouty flare.  Historically he has been treated for his conditions with neurontin and colchicine for maintenance, but for insurance issues was not able to maintain this regimen. He now has medicaid but has been unsuccessful finding a provider to accept him as a patient.       The history is provided by the patient.    Past Medical History  Diagnosis Date  . Gout   . Cellulitis   . Obesity   . Erosive esophagitis 09/01/2012    NSAID induced.  . Gastric ulcer with hemorrhage 09/02/2012    s/p bleeding control tx. Per Dr. Jena Gauss  . Acute blood loss anemia 09/02/2012    S/p 1 unit rbcs   Past Surgical History  Procedure Laterality Date  . Tonsillectomy    . Esophagogastroduodenoscopy Left 09/01/2012    FAO:ZHYQMV esophageal erosions consistent with erosive reflux esophagitis/Pre-pyloric benign-appearing gastric ulcer with bleeding stigmata status post bleeding control therapy as described above  . Esophagogastroduodenoscopy N/A 12/13/2012    Procedure: ESOPHAGOGASTRODUODENOSCOPY  (EGD);  Surgeon: Corbin Ade, MD;  Location: [facility];  Service: Endoscopy;  Laterality: N/A;  11:30   Family History  Problem Relation Age of Onset  . Heart failure Mother    History  Substance Use Topics  . Smoking status: Light Tobacco Smoker -- 0.25 packs/day for 7 years    Types: Cigarettes  . Smokeless tobacco: Current User    Types: Snuff  . Alcohol Use: No    Review of Systems  Constitutional: Negative for fever and chills.  Musculoskeletal: Positive for arthralgias and joint swelling. Negative for myalgias.  Skin: Positive for color change. Negative for rash and wound.  Neurological: Negative for weakness and numbness.      Allergies  Asa; Nsaids; Penicillins; and Sulfa antibiotics  Home Medications   Current Outpatient Rx  Name  Route  Sig  Dispense  Refill  . acetaminophen (TYLENOL) 325 MG tablet   Oral   Take 650 mg by mouth every 6 (six) hours as needed for pain.         Marland Kitchen colchicine 0.6 MG tablet   Oral   Take 1 tablet (0.6 mg total) by mouth daily.   30 tablet   0   . lansoprazole (PREVACID) 30 MG capsule   Oral   Take 1 capsule (30 mg total) by mouth 2 (two) times daily before a meal.   60 capsule   0   . oxyCODONE-acetaminophen (PERCOCET) 10-325 MG per tablet   Oral   Take 1 tablet by mouth every 4 (four)  hours as needed for pain.   30 tablet   0   . predniSONE (DELTASONE) 20 MG tablet   Oral   Take 3 tablets (60 mg total) by mouth daily.   12 tablet   0    BP 140/82  Pulse 79  Temp(Src) 98.3 F (36.8 C) (Oral)  Resp 18  SpO2 98% Physical Exam  Constitutional: He appears well-developed and well-nourished.  HENT:  Head: Atraumatic.  Neck: Normal range of motion.  Cardiovascular:  Pulses equal bilaterally  Musculoskeletal:  He has ttp across bilateral dorsal feet, edema at left mtp without erythema or increased warmth, no red streaking.  Dorsalis pedis pulses normal with less than 3 sec cap refill.  Right hand proximal  index finger has erythema, edema and ttp with even fine touch at the mcp joint,  No red streaking, no skin lesions or wounds.  Distal cap refill less than 3 sec.    Neurological: He is alert. He has normal strength. He displays normal reflexes. No sensory deficit.  Skin: Skin is warm and dry.  Psychiatric: He has a normal mood and affect.    ED Course  Procedures (including critical care time) Labs Review Labs Reviewed - No data to display Imaging Review No results found.   EKG Interpretation None      MDM   Final diagnoses:  Gout    Pt with obvious gout flare.  Doubt cellulitis.  Pt was advised elevation, warm compresses.  He was placed on a short prednisone pulse dose along with prevacid refill ( has been this med historically for daily use).  Oxycodone prescribed for pain relief.  Also prescribed colchicine 0.6 mg tabs,  Advised hold this until gouty flare resolved, then start taking for maintenance.  He was given resource guide along with Dr Harrie JeansZ Hall's info as he may be accepting new patients.  The patient appears reasonably screened and/or stabilized for discharge and I doubt any other medical condition or other [facility] further screening, evaluation, or treatment in the ED at this time prior to discharge.     Burgess AmorJulie Arash Karstens, PA-C 06/27/13 1416

## 2013-06-28 NOTE — ED Provider Notes (Signed)
Medical screening examination/treatment/procedure(s) were performed by non-physician practitioner and as supervising physician I was immediately available for consultation/collaboration.   EKG Interpretation None        Joya Gaskinsonald W Noor Vidales, MD 06/28/13 1907

## 2013-07-28 ENCOUNTER — Encounter (HOSPITAL_COMMUNITY): Payer: Self-pay | Admitting: Emergency Medicine

## 2013-07-28 ENCOUNTER — Emergency Department (HOSPITAL_COMMUNITY)
Admission: EM | Admit: 2013-07-28 | Discharge: 2013-07-29 | Disposition: A | Discharge status: Home / Self Care | Payer: Medicaid Other | Attending: Emergency Medicine | Admitting: Emergency Medicine

## 2013-07-28 DIAGNOSIS — IMO0002 Reserved for concepts with insufficient information to code with codable children: Secondary | ICD-10-CM | POA: Insufficient documentation

## 2013-07-28 DIAGNOSIS — Z8711 Personal history of peptic ulcer disease: Secondary | ICD-10-CM | POA: Insufficient documentation

## 2013-07-28 DIAGNOSIS — Z880 Allergy status to penicillin: Secondary | ICD-10-CM | POA: Insufficient documentation

## 2013-07-28 DIAGNOSIS — Z862 Personal history of diseases of the blood and blood-forming organs and certain disorders involving the immune mechanism: Secondary | ICD-10-CM | POA: Insufficient documentation

## 2013-07-28 DIAGNOSIS — Z792 Long term (current) use of antibiotics: Secondary | ICD-10-CM | POA: Insufficient documentation

## 2013-07-28 DIAGNOSIS — M109 Gout, unspecified: Secondary | ICD-10-CM

## 2013-07-28 DIAGNOSIS — R509 Fever, unspecified: Secondary | ICD-10-CM | POA: Insufficient documentation

## 2013-07-28 DIAGNOSIS — Z8719 Personal history of other diseases of the digestive system: Secondary | ICD-10-CM | POA: Insufficient documentation

## 2013-07-28 DIAGNOSIS — B353 Tinea pedis: Secondary | ICD-10-CM | POA: Insufficient documentation

## 2013-07-28 DIAGNOSIS — Z79899 Other long term (current) drug therapy: Secondary | ICD-10-CM | POA: Insufficient documentation

## 2013-07-28 DIAGNOSIS — E669 Obesity, unspecified: Secondary | ICD-10-CM | POA: Insufficient documentation

## 2013-07-28 DIAGNOSIS — F17200 Nicotine dependence, unspecified, uncomplicated: Secondary | ICD-10-CM | POA: Insufficient documentation

## 2013-07-28 MED ORDER — ONDANSETRON 8 MG PO TBDP
8.0000 mg | ORAL_TABLET | Freq: Once | ORAL | Status: AC
  Administered 2013-07-28: 8 mg via ORAL
  Filled 2013-07-28: qty 1

## 2013-07-28 MED ORDER — HYDROMORPHONE HCL PF 1 MG/ML IJ SOLN
1.0000 mg | Freq: Once | INTRAMUSCULAR | Status: AC
  Administered 2013-07-28: 1 mg via INTRAMUSCULAR
  Filled 2013-07-28: qty 1

## 2013-07-28 NOTE — ED Provider Notes (Signed)
CSN: [id number]     Arrival date & time 07/28/13  2306 History   First MD Initiated Contact with Patient 07/28/13 2318     Chief Complaint  Patient presents with  . Gout     (Consider location/radiation/quality/duration/timing/severity/associated sxs/prior Treatment) HPI Comments: [name] is a 28 y.o. Male with a history of chronic intermittent gout,  Mostly occuring in his feet and ankles who presents with worsening pain symptoms over the past several days.  He reports worsened swelling and redness involving his left foot and toes which is worse than his normal gouty flares.  He has had subjective fevers and chills without documented fevers.  He also struggles with tinea pedis which keeps the skin on his feet peeling and cracking.  He has had no success locating a pcp since his last visit here at which time he was prescribed colchicine for daily use.  He ran out of this medication last week, but is unsure if this was helping as he reports daily gout pain.  He has found no alleviators.       The history is provided by the patient.    Past Medical History  Diagnosis Date  . Gout   . Cellulitis   . Obesity   . Erosive esophagitis 09/01/2012    NSAID induced.  . Gastric ulcer with hemorrhage 09/02/2012    s/p bleeding control tx. Per Dr. Jena Gaussourk  . Acute blood loss anemia 09/02/2012    S/p 1 unit rbcs   Past Surgical History  Procedure Laterality Date  . Tonsillectomy    . Esophagogastroduodenoscopy Left 09/01/2012    WUJ:[id number]:Distal esophageal erosions consistent with erosive reflux esophagitis/Pre-pyloric benign-appearing gastric ulcer with bleeding stigmata status post bleeding control therapy as described above  . Esophagogastroduodenoscopy N/A 12/13/2012    Procedure: ESOPHAGOGASTRODUODENOSCOPY (EGD);  Surgeon: Corbin Adeobert M Rourk, MD;  Location: [facility];  Service: Endoscopy;  Laterality: N/A;  11:30   Family History  Problem Relation Age of Onset  . Heart failure Mother     History  Substance Use Topics  . Smoking status: Light Tobacco Smoker -- 0.25 packs/day for 7 years    Types: Cigarettes  . Smokeless tobacco: Current User    Types: Snuff  . Alcohol Use: No    Review of Systems  Constitutional: Negative for fever.  Musculoskeletal: Positive for arthralgias and joint swelling. Negative for myalgias.  Skin: Positive for color change.  Neurological: Positive for numbness. Negative for weakness.      Allergies  Asa; Nsaids; Penicillins; and Sulfa antibiotics  Home Medications   Current Outpatient Rx  Name  Route  Sig  Dispense  Refill  . acetaminophen (TYLENOL) 325 MG tablet   Oral   Take 650 mg by mouth every 6 (six) hours as needed for pain.         Marland Kitchen. colchicine 0.6 MG tablet   Oral   Take 1 tablet (0.6 mg total) by mouth daily.   30 tablet   0   . doxycycline (VIBRAMYCIN) 100 MG capsule   Oral   Take 1 capsule (100 mg total) by mouth 2 (two) times daily.   20 capsule   0   . lansoprazole (PREVACID) 30 MG capsule   Oral   Take 1 capsule (30 mg total) by mouth 2 (two) times daily before a meal.   60 capsule   0   . oxyCODONE-acetaminophen (PERCOCET) 10-325 MG per tablet   Oral   Take 1 tablet by  mouth every 4 (four) hours as needed for pain.   30 tablet   0   . oxyCODONE-acetaminophen (PERCOCET/ROXICET) 5-325 MG per tablet   Oral   Take 1 tablet by mouth every 4 (four) hours as needed for severe pain.   20 tablet   0   . predniSONE (DELTASONE) 20 MG tablet   Oral   Take 3 tablets (60 mg total) by mouth daily.   12 tablet   0   . predniSONE (DELTASONE) 50 MG tablet      1 tablet by mouth daily for 5 days   5 tablet   0    BP 160/102  Pulse 88  Temp(Src) 98 F (36.7 C) (Oral)  Resp 24  Ht 5\' 7"  (1.702 m)  Wt 370 lb (167.831 kg)  BMI 57.94 kg/m2  SpO2 98% Physical Exam  Constitutional: He appears well-developed and well-nourished.  HENT:  Head: Atraumatic.  Neck: Normal range of motion.   Cardiovascular:  Pulses:      Dorsalis pedis pulses are 2+ on the right side, and 2+ on the left side.  Pulses equal bilaterally.  Less than 3 sec cap refill in toes.  Musculoskeletal: He exhibits edema and tenderness.  Neurological: He is alert. He has normal strength. He displays normal reflexes. No sensory deficit.  Skin: Skin is warm and dry. There is erythema.  Erythema left dorsal foot and toes with significant edema distal foot and great toe.  Erythema with clearly defined border mid foot without red streaking.  Scaling of skin on feet.    Psychiatric: He has a normal mood and affect.    ED Course  Procedures (including critical care time) Labs Review Labs Reviewed  CBC WITH DIFFERENTIAL - Abnormal; Notable for the following:    Eosinophils Relative 6 (*)    All other components within normal limits  URIC ACID - Abnormal; Notable for the following:    Uric Acid, Serum 11.3 (*)    All other components within normal limits  BASIC METABOLIC PANEL - Abnormal; Notable for the following:    Potassium 3.3 (*)    All other components within normal limits   Imaging Review No results found.   EKG Interpretation None      MDM   Final diagnoses:  Gout attack    Pt with history of gout with elevated uric acid level tonight.  Exam also concerning for possible early cellulitis.  He was placed on prednisone taper for gouty flare,  But was also placed on doxycycline for possible early skin infection.  He was prescribed oxycodone, encouraged heat,  Elevation.  Referrals given for obtaining f/u care. Discussed elevated bp.  Advised recheck of this when pain is improved.   In the interim, advised return here for any worsened sx.      Burgess AmorJulie Endya Austin, PA-C 07/29/13 [phone]

## 2013-07-28 NOTE — ED Notes (Signed)
I usually deal with my gout every day. The pain in my feet is worse than normal per pt. Also having pain in both ankles per pt.

## 2013-07-29 LAB — URIC ACID: Uric Acid, Serum: 11.3 mg/dL — ABNORMAL HIGH (ref 4.0–7.8)

## 2013-07-29 LAB — CBC WITH DIFFERENTIAL/PLATELET
Basophils Absolute: 0 10*3/uL (ref 0.0–0.1)
Basophils Relative: 0 % (ref 0–1)
EOS ABS: 0.4 10*3/uL (ref 0.0–0.7)
EOS PCT: 6 % — AB (ref 0–5)
HEMATOCRIT: 40.5 % (ref 39.0–52.0)
Hemoglobin: 13.6 g/dL (ref 13.0–17.0)
LYMPHS ABS: 2.4 10*3/uL (ref 0.7–4.0)
Lymphocytes Relative: 32 % (ref 12–46)
MCH: 28.7 pg (ref 26.0–34.0)
MCHC: 33.6 g/dL (ref 30.0–36.0)
MCV: 85.4 fL (ref 78.0–100.0)
MONO ABS: 0.6 10*3/uL (ref 0.1–1.0)
Monocytes Relative: 8 % (ref 3–12)
Neutro Abs: 4 10*3/uL (ref 1.7–7.7)
Neutrophils Relative %: 54 % (ref 43–77)
Platelets: 227 10*3/uL (ref 150–400)
RBC: 4.74 MIL/uL (ref 4.22–5.81)
RDW: 13.5 % (ref 11.5–15.5)
WBC: 7.5 10*3/uL (ref 4.0–10.5)

## 2013-07-29 LAB — BASIC METABOLIC PANEL
BUN: 9 mg/dL (ref 6–23)
CHLORIDE: 104 meq/L (ref 96–112)
CO2: 29 meq/L (ref 19–32)
CREATININE: 0.68 mg/dL (ref 0.50–1.35)
Calcium: 8.7 mg/dL (ref 8.4–10.5)
GFR calc Af Amer: >90 mL/min (ref >90)
GFR calc non Af Amer: >90 mL/min (ref >90)
GLUCOSE: 97 mg/dL (ref 70–99)
Potassium: 3.3 mEq/L — ABNORMAL LOW (ref 3.7–5.3)
Sodium: 143 mEq/L (ref 137–147)

## 2013-07-29 MED ORDER — PREDNISONE 50 MG PO TABS: ORAL_TABLET | ORAL | Status: DC

## 2013-07-29 MED ORDER — COLCHICINE 0.6 MG PO TABS
ORAL_TABLET | ORAL | Status: AC
  Filled 2013-07-29: qty 2

## 2013-07-29 MED ORDER — OXYCODONE-ACETAMINOPHEN 5-325 MG PO TABS: 1.0000 | ORAL_TABLET | ORAL | Status: DC | PRN

## 2013-07-29 MED ORDER — OXYCODONE-ACETAMINOPHEN 5-325 MG PO TABS
2.0000 | ORAL_TABLET | Freq: Once | ORAL | Status: AC
  Administered 2013-07-29: 2 via ORAL
  Filled 2013-07-29: qty 2

## 2013-07-29 MED ORDER — PREDNISONE 50 MG PO TABS
60.0000 mg | ORAL_TABLET | Freq: Once | ORAL | Status: AC
  Administered 2013-07-29: 01:00:00 60 mg via ORAL
  Filled 2013-07-29 (×2): qty 1

## 2013-07-29 MED ORDER — COLCHICINE 0.6 MG PO TABS
1.2000 mg | ORAL_TABLET | Freq: Once | ORAL | Status: AC
  Administered 2013-07-29: 1.2 mg via ORAL

## 2013-07-29 MED ORDER — DOXYCYCLINE HYCLATE 100 MG PO CAPS: 100.0000 mg | ORAL_CAPSULE | Freq: Two times a day (BID) | ORAL | Status: DC

## 2013-07-29 MED ORDER — DOXYCYCLINE HYCLATE 100 MG PO TABS
100.0000 mg | ORAL_TABLET | Freq: Once | ORAL | Status: AC
  Administered 2013-07-29: 100 mg via ORAL
  Filled 2013-07-29: qty 1

## 2013-07-29 NOTE — Discharge Instructions (Signed)
Gout °Gout is an inflammatory arthritis caused by a buildup of uric acid crystals in the joints. Uric acid is a chemical that is normally present in the blood. When the level of uric acid in the blood is too high it can form crystals that deposit in your joints and tissues. This causes joint redness, soreness, and swelling (inflammation). Repeat attacks are common. Over time, uric acid crystals can form into masses (tophi) near a joint, destroying bone and causing disfigurement. Gout is treatable and often preventable. °CAUSES  °The disease begins with elevated levels of uric acid in the blood. Uric acid is produced by your body when it breaks down a naturally found substance called purines. Certain foods you eat, such as meats and fish, contain high amounts of purines. Causes of an elevated uric acid level include: °· Being passed down from parent to child (heredity). °· Diseases that cause increased uric acid production (such as obesity, psoriasis, and certain cancers). °· Excessive alcohol use. °· Diet, especially diets rich in meat and seafood. °· Medicines, including certain cancer-fighting medicines (chemotherapy), water pills (diuretics), and aspirin. °· Chronic kidney disease. The kidneys are no longer able to remove uric acid well. °· Problems with metabolism. °Conditions strongly associated with gout include: °· Obesity. °· High blood pressure. °· High cholesterol. °· Diabetes. °Not everyone with elevated uric acid levels gets gout. It is not understood why some people get gout and others do not. Surgery, joint injury, and eating too much of certain foods are some of the factors that can lead to gout attacks. °SYMPTOMS  °· An attack of gout comes on quickly. It causes intense pain with redness, swelling, and warmth in a joint. °· Fever can occur. °· Often, only one joint is involved. Certain joints are more commonly involved: °· Base of the big toe. °· Knee. °· Ankle. °· Wrist. °· Finger. °Without  treatment, an attack usually goes away in a few days to weeks. Between attacks, you usually will not have symptoms, which is different from many other forms of arthritis. °DIAGNOSIS  °Your caregiver will suspect gout based on your symptoms and exam. In some cases, tests may be recommended. The tests may include: °· Blood tests. °· Urine tests. °· X-rays. °· Joint fluid exam. This exam requires a needle to remove fluid from the joint (arthrocentesis). Using a microscope, gout is confirmed when uric acid crystals are seen in the joint fluid. °TREATMENT  °There are two phases to gout treatment: treating the sudden onset (acute) attack and preventing attacks (prophylaxis). °· Treatment of an Acute Attack. °· Medicines are used. These include anti-inflammatory medicines or steroid medicines. °· An injection of steroid medicine into the affected joint is sometimes necessary. °· The painful joint is rested. Movement can worsen the arthritis. °· You may use warm or cold treatments on painful joints, depending which works best for you. °· Treatment to Prevent Attacks. °· If you suffer from frequent gout attacks, your caregiver may advise preventive medicine. These medicines are started after the acute attack subsides. These medicines either help your kidneys eliminate uric acid from your body or decrease your uric acid production. You may need to stay on these medicines for a very long time. °· The early phase of treatment with preventive medicine can be associated with an increase in acute gout attacks. For this reason, during the first few months of treatment, your caregiver may also advise you to take medicines usually used for acute gout treatment. Be sure you   understand your caregiver's directions. Your caregiver may make several adjustments to your medicine dose before these medicines are effective.  Discuss dietary treatment with your caregiver or dietitian. Alcohol and drinks high in sugar and fructose and foods  such as meat, poultry, and seafood can increase uric acid levels. Your caregiver or dietician can advise you on drinks and foods that should be limited. HOME CARE INSTRUCTIONS   Do not take aspirin to relieve pain. This raises uric acid levels.  Only take over-the-counter or prescription medicines for pain, discomfort, or fever as directed by your caregiver.  Rest the joint as much as possible. When in bed, keep sheets and blankets off painful areas.  Keep the affected joint raised (elevated).  Apply warm or cold treatments to painful joints. Use of warm or cold treatments depends on which works best for you.  Use crutches if the painful joint is in your leg.  Drink enough fluids to keep your urine clear or pale yellow. This helps your body get rid of uric acid. Limit alcohol, sugary drinks, and fructose drinks.  Follow your dietary instructions. Pay careful attention to the amount of protein you eat. Your daily diet should emphasize fruits, vegetables, whole grains, and fat-free or low-fat milk products. Discuss the use of coffee, vitamin C, and cherries with your caregiver or dietician. These may be helpful in lowering uric acid levels.  Maintain a healthy body weight. SEEK MEDICAL CARE IF:   You develop diarrhea, vomiting, or any side effects from medicines.  You do not feel better in 24 hours, or you are getting worse. SEEK IMMEDIATE MEDICAL CARE IF:   Your joint becomes suddenly more tender, and you have chills or a fever. MAKE SURE YOU:   Understand these instructions.  Will watch your condition.  Will get help right away if you are not doing well or get worse. Document Released: 03/31/2000 Document Revised: 07/29/2012 Document Reviewed: 11/15/2011 [facility] Patient Information 2014 [place] [place].   Take the medicines as prescribed,  Use caution when taking oxycodone as this medicine will make you sleepy.  Elevate your foot,  Warm compresses may be helpful.   Get  rechecked for any worsened pain or if the redness spreads as discussed.  You are also being treated for a possible skin infection with the doxycycline antibiotic.  Complete the entire course of this medicine.

## 2013-07-29 NOTE — ED Provider Notes (Signed)
Medical screening examination/treatment/procedure(s) were performed by non-physician practitioner and as supervising physician I was immediately available for consultation/collaboration.    Erabella Kuipers D Yamila Cragin, MD 07/29/13 0257 

## 2013-08-06 ENCOUNTER — Emergency Department (HOSPITAL_COMMUNITY)
Admission: EM | Admit: 2013-08-06 | Discharge: 2013-08-06 | Disposition: A | Discharge status: Home / Self Care | Payer: Medicaid Other | Attending: Emergency Medicine | Admitting: Emergency Medicine

## 2013-08-06 ENCOUNTER — Encounter (HOSPITAL_COMMUNITY): Payer: Self-pay | Admitting: Emergency Medicine

## 2013-08-06 DIAGNOSIS — F17200 Nicotine dependence, unspecified, uncomplicated: Secondary | ICD-10-CM | POA: Insufficient documentation

## 2013-08-06 DIAGNOSIS — R21 Rash and other nonspecific skin eruption: Secondary | ICD-10-CM | POA: Insufficient documentation

## 2013-08-06 DIAGNOSIS — M79672 Pain in left foot: Secondary | ICD-10-CM

## 2013-08-06 DIAGNOSIS — M25579 Pain in unspecified ankle and joints of unspecified foot: Secondary | ICD-10-CM | POA: Insufficient documentation

## 2013-08-06 DIAGNOSIS — Z792 Long term (current) use of antibiotics: Secondary | ICD-10-CM | POA: Insufficient documentation

## 2013-08-06 DIAGNOSIS — Z8719 Personal history of other diseases of the digestive system: Secondary | ICD-10-CM | POA: Insufficient documentation

## 2013-08-06 DIAGNOSIS — G8929 Other chronic pain: Secondary | ICD-10-CM | POA: Insufficient documentation

## 2013-08-06 DIAGNOSIS — Z862 Personal history of diseases of the blood and blood-forming organs and certain disorders involving the immune mechanism: Secondary | ICD-10-CM | POA: Insufficient documentation

## 2013-08-06 DIAGNOSIS — Z872 Personal history of diseases of the skin and subcutaneous tissue: Secondary | ICD-10-CM | POA: Insufficient documentation

## 2013-08-06 DIAGNOSIS — Z880 Allergy status to penicillin: Secondary | ICD-10-CM | POA: Insufficient documentation

## 2013-08-06 MED ORDER — PREDNISONE 50 MG PO TABS: 50.0000 mg | ORAL_TABLET | Freq: Every day | ORAL | Status: DC

## 2013-08-06 MED ORDER — OXYCODONE-ACETAMINOPHEN 5-325 MG PO TABS
2.0000 | ORAL_TABLET | Freq: Once | ORAL | Status: AC
  Administered 2013-08-06: 2 via ORAL
  Filled 2013-08-06: qty 2

## 2013-08-06 MED ORDER — HYDROXYZINE HCL 25 MG PO TABS: 25.0000 mg | ORAL_TABLET | Freq: Four times a day (QID) | ORAL | Status: DC

## 2013-08-06 MED ORDER — OXYCODONE-ACETAMINOPHEN 7.5-325 MG PO TABS: 1.0000 | ORAL_TABLET | Freq: Four times a day (QID) | ORAL | Status: DC | PRN

## 2013-08-06 MED ORDER — HYDROXYZINE HCL 25 MG PO TABS
50.0000 mg | ORAL_TABLET | Freq: Once | ORAL | Status: AC
  Administered 2013-08-06: 50 mg via ORAL
  Filled 2013-08-06: qty 2

## 2013-08-06 NOTE — ED Notes (Signed)
Pt c/o left foot pain and rash to the buttocks. Pt was seen here for the same.

## 2013-08-06 NOTE — ED Notes (Signed)
Pt received discharge instructions and prescriptions, verbalized understanding and has no further questions. Pt brought to exit in wheelchair stable condition accompanied by father.

## 2013-08-06 NOTE — Discharge Instructions (Signed)
Rash  A rash is a change in the color or feel of your skin. There are many different types of rashes. You may have other problems along with your rash.  HOME CARE  · Avoid the thing that caused your rash.  · Do not scratch your rash.  · You may take cools baths to help stop itching.  · Only take medicines as told by your doctor.  · Keep all doctor visits as told.  GET HELP RIGHT AWAY IF:   · Your pain, puffiness (swelling), or redness gets worse.  · You have a fever.  · You have new or severe problems.  · You have body aches, watery poop (diarrhea), or you throw up (vomit).  · Your rash is not better after 3 days.  MAKE SURE YOU:   · Understand these instructions.  · Will watch your condition.  · Will get help right away if you are not doing well or get worse.  Document Released: 09/20/2007 Document Revised: 06/26/2011 Document Reviewed: 01/16/2011  [employer]® Patient Information ©2014 [employer].

## 2013-08-06 NOTE — ED Provider Notes (Signed)
CSN: [id number]     Arrival date & time 08/06/13  2031 History   First MD Initiated Contact with Patient 08/06/13 2101     Chief Complaint  Patient presents with  . Foot Pain     (Consider location/radiation/quality/duration/timing/severity/associated sxs/prior Treatment) HPI Comments: [name] is a 28 y.o. Morbidly obese male who presents to the Emergency Department complaining of chronic pain to the left foot.  Patient reports hx of recurrent gout and states pain is similar to previous gout attacks.  He states that he was seen here last week for same and given doxy, percocet , and prednisone which he states have not helped the pain.  He denies injury.  He also c/o rash to his buttocks which is also chronic, but states for one week, the rash has began to spread and is now "all over" his buttocks, lower back and down both legs.  He describes the rash as itching and burning.  He denies hx of DM, eczema, swelling, fever, chills, tick bite, fever or vomiting.    The history is provided by the patient.    Past Medical History  Diagnosis Date  . Gout   . Cellulitis   . Obesity   . Erosive esophagitis 09/01/2012    NSAID induced.  . Gastric ulcer with hemorrhage 09/02/2012    s/p bleeding control tx. Per Dr. Jena Gaussourk  . Acute blood loss anemia 09/02/2012    S/p 1 unit rbcs   Past Surgical History  Procedure Laterality Date  . Tonsillectomy    . Esophagogastroduodenoscopy Left 09/01/2012    WUJ:WJXBJYRMR:Distal esophageal erosions consistent with erosive reflux esophagitis/Pre-pyloric benign-appearing gastric ulcer with bleeding stigmata status post bleeding control therapy as described above  . Esophagogastroduodenoscopy N/A 12/13/2012    Procedure: ESOPHAGOGASTRODUODENOSCOPY (EGD);  Surgeon: Corbin Adeobert M Rourk, MD;  Location: [facility];  Service: Endoscopy;  Laterality: N/A;  11:30   Family History  Problem Relation Age of Onset  . Heart failure Mother    History  Substance Use Topics  .  Smoking status: Light Tobacco Smoker -- 0.25 packs/day for 7 years    Types: Cigarettes  . Smokeless tobacco: Current User    Types: Snuff  . Alcohol Use: No    Review of Systems  Constitutional: Negative for fever and chills.  Respiratory: Negative for chest tightness and shortness of breath.   Cardiovascular: Negative for chest pain.  Gastrointestinal: Negative for nausea, vomiting and abdominal pain.  Genitourinary: Negative for dysuria and difficulty urinating.  Musculoskeletal: Positive for arthralgias. Negative for back pain, joint swelling, neck pain and neck stiffness.  Skin: Positive for rash. Negative for color change and wound.  Neurological: Negative for dizziness, weakness, light-headedness and numbness.  Hematological: Negative for adenopathy.  All other systems reviewed and are negative.     Allergies  Asa; Nsaids; Penicillins; and Sulfa antibiotics  Home Medications   Prior to Admission medications   Medication Sig Start Date End Date Taking? Authorizing Provider  doxycycline (VIBRAMYCIN) 100 MG capsule Take 1 capsule (100 mg total) by mouth 2 (two) times daily. 07/29/13  Yes Raynelle FanningJulie Idol, PA-C   BP 157/92  Pulse 97  Temp(Src) 97.9 F (36.6 C) (Oral)  Resp 24  Ht 5\' 7"  (1.702 m)  Wt 370 lb (167.831 kg)  BMI 57.94 kg/m2  SpO2 100% Physical Exam  Nursing note and vitals reviewed. Constitutional: He is oriented to person, place, and time. He appears well-developed and well-nourished. No distress.  Morbidly obese  HENT:  Head: Normocephalic and atraumatic.  Mouth/Throat: Oropharynx is clear and moist.  Neck: Normal range of motion. Neck supple.  Cardiovascular: Normal rate, regular rhythm, normal heart sounds and intact distal pulses.   No murmur heard. Pulmonary/Chest: Effort normal and breath sounds normal. No respiratory distress. He exhibits no tenderness.  Musculoskeletal: He exhibits edema and tenderness.  ttp of the dorsal mid foot with mild  erythema and STS.  DP pulse brisk, distal sensation intact.  No proximal tenderness or edema.  No lymphangitis.  Scaling of the skin to bilateral feet.    Lymphadenopathy:    He has no cervical adenopathy.  Neurological: He is alert and oriented to person, place, and time. He exhibits normal muscle tone. Coordination normal.  Skin: Skin is warm. Rash noted. There is erythema.  Confluent erythematous. Maculopapular rash to the bilateral buttocks, lower back and bilateral LE's.  Excoriations present.  No edema, vesicles, or pustules.    Psychiatric: He has a normal mood and affect.    ED Course  Procedures (including critical care time) Labs Review Labs Reviewed - No data to display  Imaging Review No results found.   EKG Interpretation None      MDM   Final diagnoses:  Foot pain, left  Rash and nonspecific skin eruption    Previous ed chart reviewed by me.  Pt has hx of frequent gout flares.  No concerning sx's for cellulitis at this time. Foot pain likely related to gout   2130  Patient also seen by Dr. Rubin PayorPickering.  Care plan discussed which includes prednisone x 5 days, continue his current abx and percocet #10. Pt given referral info for [facility].    VSS.  Pt is non-toxic appearing and stable for d/c.    Antwonette Feliz L. Trisha Mangleriplett, PA-C 08/07/13 2337

## 2013-08-11 NOTE — ED Provider Notes (Signed)
Medical screening examination/treatment/procedure(s) were performed by non-physician practitioner and as supervising physician I was immediately available for consultation/collaboration.   EKG Interpretation None       Juliet RudeNathan R. Rubin PayorPickering, MD 08/11/13 [phone]

## 2013-09-24 ENCOUNTER — Emergency Department (HOSPITAL_COMMUNITY)
Admission: EM | Admit: 2013-09-24 | Discharge: 2013-09-25 | Disposition: A | Discharge status: Home / Self Care | Payer: Medicaid Other | Attending: Emergency Medicine | Admitting: Emergency Medicine

## 2013-09-24 DIAGNOSIS — Z792 Long term (current) use of antibiotics: Secondary | ICD-10-CM | POA: Insufficient documentation

## 2013-09-24 DIAGNOSIS — M79672 Pain in left foot: Secondary | ICD-10-CM

## 2013-09-24 DIAGNOSIS — Z880 Allergy status to penicillin: Secondary | ICD-10-CM | POA: Insufficient documentation

## 2013-09-24 DIAGNOSIS — Z872 Personal history of diseases of the skin and subcutaneous tissue: Secondary | ICD-10-CM | POA: Insufficient documentation

## 2013-09-24 DIAGNOSIS — Z862 Personal history of diseases of the blood and blood-forming organs and certain disorders involving the immune mechanism: Secondary | ICD-10-CM | POA: Insufficient documentation

## 2013-09-24 DIAGNOSIS — Z8669 Personal history of other diseases of the nervous system and sense organs: Secondary | ICD-10-CM | POA: Insufficient documentation

## 2013-09-24 DIAGNOSIS — Z8719 Personal history of other diseases of the digestive system: Secondary | ICD-10-CM | POA: Insufficient documentation

## 2013-09-24 DIAGNOSIS — E669 Obesity, unspecified: Secondary | ICD-10-CM | POA: Insufficient documentation

## 2013-09-24 DIAGNOSIS — F17200 Nicotine dependence, unspecified, uncomplicated: Secondary | ICD-10-CM | POA: Insufficient documentation

## 2013-09-24 DIAGNOSIS — IMO0002 Reserved for concepts with insufficient information to code with codable children: Secondary | ICD-10-CM | POA: Insufficient documentation

## 2013-09-24 DIAGNOSIS — Z79899 Other long term (current) drug therapy: Secondary | ICD-10-CM | POA: Insufficient documentation

## 2013-09-24 DIAGNOSIS — M79609 Pain in unspecified limb: Secondary | ICD-10-CM | POA: Insufficient documentation

## 2013-09-25 ENCOUNTER — Encounter (HOSPITAL_COMMUNITY): Payer: Self-pay | Admitting: Emergency Medicine

## 2013-09-25 MED ORDER — OXYCODONE-ACETAMINOPHEN 5-325 MG PO TABS
2.0000 | ORAL_TABLET | Freq: Once | ORAL | Status: AC
  Administered 2013-09-25: 2 via ORAL
  Filled 2013-09-25: qty 2

## 2013-09-25 MED ORDER — OXYCODONE-ACETAMINOPHEN 5-325 MG PO TABS: 2.0000 | ORAL_TABLET | ORAL | Status: DC | PRN

## 2013-09-25 NOTE — ED Provider Notes (Signed)
CSN: [id number]     Arrival date & time 09/24/13  2353 History  This chart was scribed for Loren Raceravid Tylasia Fletchall, MD by [place] Peace, ED Scribe. The patient was seen in [facility]. The patient's care was started at 12:17 AM.    Chief Complaint  Patient presents with  . Foot Pain      Patient is a 28 y.o. male presenting with lower extremity pain. The history is provided by the patient. No language interpreter was used.  Foot Pain  HPI Comments: [name] is a 28 y.o. male who presents to the Emergency Department complaining of severe burning pain in his left foot onset this morning along the top of his metatarsals and the sole of his foot that is keeping him from sleeping. He reports history of gout, arthritis and neuropathy in his feet and hands. Pt states that he has not been taking any medication to alleviate pain. He has PCP who he states he is suppose to see within the next couple weeks. No fever or chills. No weakness or numbness. No new swelling, redness or warmth. No history of trauma.  Past Medical History  Diagnosis Date  . Gout   . Cellulitis   . Obesity   . Erosive esophagitis 09/01/2012    NSAID induced.  . Gastric ulcer with hemorrhage 09/02/2012    s/p bleeding control tx. Per Dr. Jena Gaussourk  . Acute blood loss anemia 09/02/2012    S/p 1 unit rbcs   Past Surgical History  Procedure Laterality Date  . Tonsillectomy    . Esophagogastroduodenoscopy Left 09/01/2012    WUJ:WJXBJYRMR:Distal esophageal erosions consistent with erosive reflux esophagitis/Pre-pyloric benign-appearing gastric ulcer with bleeding stigmata status post bleeding control therapy as described above  . Esophagogastroduodenoscopy N/A 12/13/2012    Procedure: ESOPHAGOGASTRODUODENOSCOPY (EGD);  Surgeon: Corbin Adeobert M Rourk, MD;  Location: [facility];  Service: Endoscopy;  Laterality: N/A;  11:30   Family History  Problem Relation Age of Onset  . Heart failure Mother    History  Substance Use Topics  . Smoking  status: Light Tobacco Smoker -- 0.25 packs/day for 7 years    Types: Cigarettes  . Smokeless tobacco: Current User    Types: Snuff  . Alcohol Use: No    Review of Systems  Constitutional: Negative for fever and chills.  Musculoskeletal: Positive for arthralgias.  Skin: Negative for color change, rash and wound.  Neurological: Negative for weakness and numbness.  All other systems reviewed and are negative.     Allergies  Asa; Nsaids; Penicillins; and Sulfa antibiotics  Home Medications   Prior to Admission medications   Medication Sig Start Date End Date Taking? Authorizing Provider  doxycycline (VIBRAMYCIN) 100 MG capsule Take 1 capsule (100 mg total) by mouth 2 (two) times daily. 07/29/13   Burgess AmorJulie Idol, PA-C  hydrOXYzine (ATARAX/VISTARIL) 25 MG tablet Take 1 tablet (25 mg total) by mouth every 6 (six) hours. Prn itching 08/06/13   Tammy L. Triplett, PA-C  oxyCODONE-acetaminophen (PERCOCET) 5-325 MG per tablet Take 2 tablets by mouth every 4 (four) hours as needed. 09/25/13   Loren Raceravid Tahani Potier, MD  oxyCODONE-acetaminophen (PERCOCET) 7.5-325 MG per tablet Take 1 tablet by mouth every 6 (six) hours as needed for pain. 08/06/13   Tammy L. Triplett, PA-C  predniSONE (DELTASONE) 50 MG tablet Take 1 tablet (50 mg total) by mouth daily. For 5 days 08/06/13   Tammy L. Triplett, PA-C   Triage Vitals: BP 142/94  Pulse 84  Temp(Src) 97.6 F (36.4  C) (Oral)  Resp 20  Ht 5\' 7"  (1.702 m)  Wt 355 lb (161.027 kg)  BMI 55.59 kg/m2  SpO2 95% Physical Exam  Nursing note and vitals reviewed. Constitutional: He is oriented to person, place, and time. He appears well-developed and well-nourished. No distress.  HENT:  Head: Normocephalic and atraumatic.  Neck: Normal range of motion. Neck supple.  Cardiovascular: Normal rate.   Pulmonary/Chest: Effort normal and breath sounds normal.  Abdominal: Soft. Bowel sounds are normal.  Musculoskeletal: Normal range of motion. He exhibits tenderness. He  exhibits no edema.  Mild tenderness to palpation over the dorsal surface of the the medial surface of the left foot. There is no swelling, warmth or redness. He has full range of motion of all joints. Patient also has mild tenderness to palpation over the plantar surface of left foot just proximal to the calcaneal bone. No calf swelling or tenderness.  Neurological: He is alert and oriented to person, place, and time.  Patient with no new weakness or numbness. Is not ambulatory due to morbid obesity which is chronic and unchanged  Skin: Skin is warm and dry. No rash noted. No erythema.  Psychiatric: He has a normal mood and affect. His behavior is normal.    ED Course  Procedures (including critical care time) DIAGNOSTIC STUDIES: Oxygen Saturation is 95% on room air, adequate by my interpretation.    COORDINATION OF CARE: 12:22 AM- Treatment plan was discussed with patient who verbalizes understanding and agrees.    Labs Review Labs Reviewed - No data to display  Imaging Review No results found.   EKG Interpretation None     Medications  oxyCODONE-acetaminophen (PERCOCET/ROXICET) 5-325 MG per tablet 2 tablet (not administered)    MDM   Final diagnoses:  Left foot pain    I personally performed the services described in this documentation, which was scribed in my presence. The recorded information has been reviewed and is accurate.  No evidence of any infection or inflammation. I doubt gout. Question chronic arthritis versus neuropathy. No further workup needed. Advised to followup with his primary Dr. and may need pain management referral. Return precautions given.   [name], MD 09/25/13 [phone]

## 2013-09-25 NOTE — ED Notes (Signed)
Patient c/o left foot pain that is radiating up his leg.  Patient states he always pain, but not this bad.

## 2013-11-05 ENCOUNTER — Emergency Department (HOSPITAL_COMMUNITY)
Admission: EM | Admit: 2013-11-05 | Discharge: 2013-11-05 | Disposition: A | Discharge status: Home / Self Care | Payer: Medicaid Other | Attending: Emergency Medicine | Admitting: Emergency Medicine

## 2013-11-05 ENCOUNTER — Encounter (HOSPITAL_COMMUNITY): Payer: Self-pay | Admitting: Emergency Medicine

## 2013-11-05 DIAGNOSIS — M109 Gout, unspecified: Secondary | ICD-10-CM | POA: Diagnosis present

## 2013-11-05 DIAGNOSIS — Z79899 Other long term (current) drug therapy: Secondary | ICD-10-CM | POA: Diagnosis not present

## 2013-11-05 DIAGNOSIS — E669 Obesity, unspecified: Secondary | ICD-10-CM | POA: Diagnosis not present

## 2013-11-05 DIAGNOSIS — Z872 Personal history of diseases of the skin and subcutaneous tissue: Secondary | ICD-10-CM | POA: Insufficient documentation

## 2013-11-05 DIAGNOSIS — Z8719 Personal history of other diseases of the digestive system: Secondary | ICD-10-CM | POA: Insufficient documentation

## 2013-11-05 DIAGNOSIS — M10042 Idiopathic gout, left hand: Secondary | ICD-10-CM

## 2013-11-05 DIAGNOSIS — F17200 Nicotine dependence, unspecified, uncomplicated: Secondary | ICD-10-CM | POA: Diagnosis not present

## 2013-11-05 DIAGNOSIS — Z880 Allergy status to penicillin: Secondary | ICD-10-CM | POA: Diagnosis not present

## 2013-11-05 DIAGNOSIS — Z862 Personal history of diseases of the blood and blood-forming organs and certain disorders involving the immune mechanism: Secondary | ICD-10-CM | POA: Diagnosis not present

## 2013-11-05 MED ORDER — COLCHICINE 0.6 MG PO TABS: 0.6000 mg | ORAL_TABLET | Freq: Every day | ORAL | Status: DC

## 2013-11-05 MED ORDER — OXYCODONE-ACETAMINOPHEN 5-325 MG PO TABS: 1.0000 | ORAL_TABLET | ORAL | Status: DC | PRN

## 2013-11-05 MED ORDER — OXYCODONE-ACETAMINOPHEN 5-325 MG PO TABS
1.0000 | ORAL_TABLET | Freq: Once | ORAL | Status: AC
  Administered 2013-11-05: 1 via ORAL
  Filled 2013-11-05: qty 1

## 2013-11-05 MED ORDER — COLCHICINE 0.6 MG PO TABS
1.2000 mg | ORAL_TABLET | Freq: Once | ORAL | Status: AC
  Administered 2013-11-05: 1.2 mg via ORAL
  Filled 2013-11-05: qty 2

## 2013-11-05 NOTE — ED Notes (Signed)
Gout to left hand.  Started with pain last night and became red and hot.  Rates pain 8.  Have not taken and medication.

## 2013-11-05 NOTE — ED Notes (Signed)
Pain in left hand, history of gout

## 2013-11-05 NOTE — Discharge Instructions (Signed)
Gout Gout is when your joints become red, sore, and swell (inflammed). This is caused by the buildup of uric acid crystals in the joints. Uric acid is a chemical that is normally in the blood. If the level of uric acid gets too high in the blood, these crystals form in your joints and tissues. Over time, these crystals can form into masses near the joints and tissues. These masses can destroy bone and cause the bone to look misshapen (deformed). HOME CARE   Do not take aspirin for pain.  Only take medicine as told by your doctor.  Rest the joint as much as you can. When in bed, keep sheets and blankets off painful areas.  Keep the sore joints raised (elevated).  Put warm or cold packs on painful joints. Use of warm or cold packs depends on which works best for you.  Use crutches if the painful joint is in your leg.  Drink enough fluids to keep your pee (urine) clear or pale yellow. Limit alcohol, sugary drinks, and drinks with fructose in them.  Follow your diet instructions. Pay careful attention to how much protein you eat. Include fruits, vegetables, whole grains, and fat-free or low-fat milk products in your daily diet. Talk to your doctor or dietician about the use of coffee, vitamin C, and cherries. These may help lower uric acid levels.  Keep a healthy body weight. GET HELP RIGHT AWAY IF:   You have watery poop (diarrhea), throw up (vomit), or have any side effects from medicines.  You do not feel better in 24 hours, or you are getting worse.  Your joint becomes suddenly more tender, and you have chills or a fever. MAKE SURE YOU:   Understand these instructions.  Will watch your condition.  Will get help right away if you are not doing well or get worse. Document Released: 01/11/2008 Document Revised: 07/29/2012 Document Reviewed: 07/12/2009 [facility] Patient Information 2015 [employer], [place]. This information is not intended to replace advice given to you by your health care  provider. Make sure you discuss any questions you have with your health care provider.   You may take the oxycodone prescribed for pain relief.  This will make you drowsy - do not drive within 4 hours of taking this medication. Take one more dose of colchicine in 1 hour - this will equal todays full dose.  You may take the colchicine 1 tablet daily starting tomorrow which may help reduce the frequency of your gouty flares.

## 2013-11-06 NOTE — ED Provider Notes (Signed)
CSN: [id number]     Arrival date & time 11/05/13  1355 History   First MD Initiated Contact with Patient 11/05/13 1520     Chief Complaint  Patient presents with  . Gout     (Consider location/radiation/quality/duration/timing/severity/associated sxs/prior Treatment) The history is provided by the patient.   [name] is a 28 y.o. male with a history significant for gout and morbid obesity presenting with a flair of gout in his left hand.  He describes constant pain, mild warmth and increased swelling over the left dorsal hand with pain similar to prior gout attacks.  He denies injury at this site including skin injury/abrasion, lacerations, etc.  He has had no fevers or chills.  He states colchicine works the best for him when he has flares and has taken this medication also for prophylaxis treatment.  He has a history PUD and cannot tolerate nsaids.     Past Medical History  Diagnosis Date  . Gout   . Cellulitis   . Obesity   . Erosive esophagitis 09/01/2012    NSAID induced.  . Gastric ulcer with hemorrhage 09/02/2012    s/p bleeding control tx. Per Dr. Jena Gauss  . Acute blood loss anemia 09/02/2012    S/p 1 unit rbcs   Past Surgical History  Procedure Laterality Date  . Tonsillectomy    . Esophagogastroduodenoscopy Left 09/01/2012    WUJ:WJXBJY esophageal erosions consistent with erosive reflux esophagitis/Pre-pyloric benign-appearing gastric ulcer with bleeding stigmata status post bleeding control therapy as described above  . Esophagogastroduodenoscopy N/A 12/13/2012    Procedure: ESOPHAGOGASTRODUODENOSCOPY (EGD);  Surgeon: Corbin Ade, MD;  Location: [facility];  Service: Endoscopy;  Laterality: N/A;  11:30   Family History  Problem Relation Age of Onset  . Heart failure Mother    History  Substance Use Topics  . Smoking status: Light Tobacco Smoker -- 0.25 packs/day for 7 years    Types: Cigarettes  . Smokeless tobacco: Current User    Types: Snuff  .  Alcohol Use: No    Review of Systems  Constitutional: Negative for fever and chills.  Musculoskeletal: Positive for arthralgias and joint swelling. Negative for myalgias.  Skin: Positive for color change. Negative for rash and wound.  Neurological: Negative for weakness and numbness.      Allergies  Asa; Nsaids; Penicillins; and Sulfa antibiotics  Home Medications   Prior to Admission medications   Medication Sig Start Date End Date Taking? Authorizing Provider  colchicine 0.6 MG tablet Take 1 tablet (0.6 mg total) by mouth daily. 11/05/13   Burgess Amor, PA-C  oxyCODONE-acetaminophen (PERCOCET/ROXICET) 5-325 MG per tablet Take 1 tablet by mouth every 4 (four) hours as needed. 11/05/13   Burgess Amor, PA-C   BP 139/90  Pulse 66  Temp(Src) 97.9 F (36.6 C) (Oral)  Resp 20  Ht 5\' 7"  (1.702 m)  Wt 417 lb (189.15 kg)  BMI 65.30 kg/m2  SpO2 98% Physical Exam  Constitutional: He appears well-developed and well-nourished.  HENT:  Head: Atraumatic.  Neck: Normal range of motion.  Cardiovascular:  Pulses equal bilaterally  Musculoskeletal: He exhibits edema and tenderness.       Hands: Edema, erythema and increased warmth centered over his left index mcp.  Distal sensation intact with less than 3 sec cap refill.  Skin intact. No red streaking.  Neurological: He is alert. He has normal strength. He displays normal reflexes. No sensory deficit.  Skin: Skin is warm and dry.  Psychiatric: He has  a normal mood and affect.    ED Course  Procedures (including critical care time) Labs Review Labs Reviewed - No data to display  Imaging Review No results found.   EKG Interpretation None      MDM   Final diagnoses:  Acute idiopathic gout of left hand    Pt with history of gout and current presentation patient reporting similar to other gout flares.  Denies trauma, skin intact, doubt cellulitis, septic joint.  He was prescribed colchicine, given one tab now, advised to take 2 in  1 hour, then one tablet daily starting tomorrow for preventative.  Elevation, heat,  Percocet.  Advised f/u with pcp for a recheck if not improving over the next several days.    Burgess AmorJulie Reeshemah Nazaryan, PA-C 11/06/13 1504

## 2013-11-06 NOTE — ED Provider Notes (Signed)
Medical screening examination/treatment/procedure(s) were performed by non-physician practitioner and as supervising physician I was immediately available for consultation/collaboration.   EKG Interpretation None       Raeford RazorStephen Aprille Sawhney, MD 11/06/13 1506

## 2013-11-30 ENCOUNTER — Emergency Department (HOSPITAL_COMMUNITY)
Admission: EM | Admit: 2013-11-30 | Discharge: 2013-11-30 | Disposition: A | Discharge status: Home / Self Care | Payer: Medicaid Other | Attending: Emergency Medicine | Admitting: Emergency Medicine

## 2013-11-30 ENCOUNTER — Encounter (HOSPITAL_COMMUNITY): Payer: Self-pay | Admitting: Emergency Medicine

## 2013-11-30 DIAGNOSIS — Z872 Personal history of diseases of the skin and subcutaneous tissue: Secondary | ICD-10-CM | POA: Insufficient documentation

## 2013-11-30 DIAGNOSIS — Z79899 Other long term (current) drug therapy: Secondary | ICD-10-CM | POA: Diagnosis not present

## 2013-11-30 DIAGNOSIS — Z862 Personal history of diseases of the blood and blood-forming organs and certain disorders involving the immune mechanism: Secondary | ICD-10-CM | POA: Diagnosis not present

## 2013-11-30 DIAGNOSIS — F17200 Nicotine dependence, unspecified, uncomplicated: Secondary | ICD-10-CM | POA: Insufficient documentation

## 2013-11-30 DIAGNOSIS — M109 Gout, unspecified: Secondary | ICD-10-CM | POA: Diagnosis not present

## 2013-11-30 DIAGNOSIS — Z8719 Personal history of other diseases of the digestive system: Secondary | ICD-10-CM | POA: Diagnosis not present

## 2013-11-30 DIAGNOSIS — Z880 Allergy status to penicillin: Secondary | ICD-10-CM | POA: Insufficient documentation

## 2013-11-30 MED ORDER — HYDROCODONE-ACETAMINOPHEN 5-325 MG PO TABS: ORAL_TABLET | ORAL | Status: DC

## 2013-11-30 MED ORDER — HYDROCODONE-ACETAMINOPHEN 5-325 MG PO TABS
2.0000 | ORAL_TABLET | Freq: Once | ORAL | Status: AC
Start: 2013-11-30 — End: 2013-11-30
  Administered 2013-11-30: 2 via ORAL
  Filled 2013-11-30: qty 2

## 2013-11-30 NOTE — ED Provider Notes (Signed)
CSN: [id number]     Arrival date & time 11/30/13  2116 History   First MD Initiated Contact with Patient 11/30/13 2138     Chief Complaint  Patient presents with  . Gout     (Consider location/radiation/quality/duration/timing/severity/associated sxs/prior Treatment) HPI  [name] is a 28 y.o. male  with morbid obesity complaining of exacerbation of gout 2 left great toe proximal he 4 days ago. Patient has been taking 1x 0.6 mg ultrasound daily with little relief. States this is typical for his gout exacerbations. Patient states he cannot bear weight and has been lying down for 3 days. Patient denies calf pain or swelling, chest pain, shortness of breath, fever, chills. Patient states he has not started any new medicines, states he hasn't had anything to eat without his gout. States he is very careful about avoiding greens and cauliflower.   Past Medical History  Diagnosis Date  . Gout   . Cellulitis   . Obesity   . Erosive esophagitis 09/01/2012    NSAID induced.  . Gastric ulcer with hemorrhage 09/02/2012    s/p bleeding control tx. Per Dr. Jena Gaussourk  . Acute blood loss anemia 09/02/2012    S/p 1 unit rbcs   Past Surgical History  Procedure Laterality Date  . Tonsillectomy    . Esophagogastroduodenoscopy Left 09/01/2012    OZH:[id number]:Distal esophageal erosions consistent with erosive reflux esophagitis/Pre-pyloric benign-appearing gastric ulcer with bleeding stigmata status post bleeding control therapy as described above  . Esophagogastroduodenoscopy N/A 12/13/2012    Procedure: ESOPHAGOGASTRODUODENOSCOPY (EGD);  Surgeon: Corbin Adeobert M Rourk, MD;  [facility]: [facility];  Service: Endoscopy;  Laterality: N/A;  11:30   Family History  Problem Relation Age of Onset  . Heart failure Mother    History  Substance Use Topics  . Smoking status: Light Tobacco Smoker -- 0.25 packs/day for 7 years    Types: Cigarettes  . Smokeless tobacco: Current User    Types: Snuff  . Alcohol Use: No     Review of Systems  10 systems reviewed and found to be negative, except as noted in the HPI.   Allergies  Asa; Nsaids; Penicillins; and Sulfa antibiotics  Home Medications   Prior to Admission medications   Medication Sig Start Date End Date Taking? Authorizing Provider  colchicine 0.6 MG tablet Take 1 tablet (0.6 mg total) by mouth daily. 11/05/13   Burgess AmorJulie Idol, PA-C  HYDROcodone-acetaminophen (NORCO/VICODIN) 5-325 MG per tablet Take 1-2 tablets by mouth every 6 hours as needed for pain. 11/30/13   Adyn Serna, PA-C  oxyCODONE-acetaminophen (PERCOCET/ROXICET) 5-325 MG per tablet Take 1 tablet by mouth every 4 (four) hours as needed. 11/05/13   Burgess AmorJulie Idol, PA-C   BP 138/87  Pulse 88  Temp(Src) 98.4 F (36.9 C) (Oral)  Resp 24  Ht 5\' 7"  (1.702 m)  Wt 417 lb (189.15 kg)  BMI 65.30 kg/m2  SpO2 99% Physical Exam  Nursing note and vitals reviewed. Constitutional: He is oriented to person, place, and time. He appears well-developed and well-nourished. No distress.  Morbidly obese  HENT:  Head: Normocephalic.  Eyes: Conjunctivae and EOM are normal.  Cardiovascular: Normal rate.   Pulmonary/Chest: Effort normal. No stridor.  Musculoskeletal: Normal range of motion.  Slight erythema and warmth to left great toe MTP. Cap refill intact, distal sensation intact.  Neurological: He is alert and oriented to person, place, and time.  Psychiatric: He has a normal mood and affect.    ED Course  Procedures (including critical  care time) Labs Review Labs Reviewed - No data to display  Imaging Review No results found.   EKG Interpretation None      MDM   Final diagnoses:  Acute gout of left foot, unspecified cause    Filed Vitals:   11/30/13 2124 11/30/13 2125  BP:  138/87  Pulse:  88  Temp:  98.4 F (36.9 C)  TempSrc:  Oral  Resp:  24  Height: 5\' 7"  (1.702 m)   Weight: 417 lb (189.15 kg)   SpO2:  99%    Medications  HYDROcodone-acetaminophen (NORCO/VICODIN)  5-325 MG per tablet 2 tablet (2 tablets Oral Given 11/30/13 2231)    [name] is a 28 y.o. male presenting with gout exacerbation. Slight warmth, doubt septic joint. Patient has been taking cold is seen one per day. I have explained to him that this is preventative dosing. Explained to him that he can have 1.8 mg per day in an acute exacerbation. Patient will be given Vicodin. I have offered him crutches but patient declined saying that he normally uses a wheelchair at home.  Evaluation does not show pathology that would require ongoing emergent intervention or inpatient treatment. Pt is hemodynamically stable and mentating appropriately. Discussed findings and plan with patient/guardian, who agrees with care plan. All questions answered. Return precautions discussed and outpatient follow up given.   New Prescriptions   HYDROCODONE-ACETAMINOPHEN (NORCO/VICODIN) 5-325 MG PER TABLET    Take 1-2 tablets by mouth every 6 hours as needed for pain.         Wynetta Emery, PA-C 11/30/13 2237

## 2013-11-30 NOTE — ED Notes (Signed)
Pt c/o gout in left foot, also c/o back pain because he has been laying down for 3 days

## 2013-11-30 NOTE — Discharge Instructions (Signed)
Take 1 Colchicine pill daily to prevent attacks. You may take up to 1.8 mg per day when the pain is severe.  Take vicodin for breakthrough pain, do not drink alcohol, drive, care for children or do other critical tasks while taking vicodin.  Please follow with your primary care doctor in the next 2 days for a check-up. They must obtain records for further management.   Do not hesitate to return to the Emergency Department for any new, worsening or concerning symptoms.   Low-Purine Diet Purines are compounds that affect the level of uric acid in your body. A low-purine diet is a diet that is low in purines. Eating a low-purine diet can prevent the level of uric acid in your body from getting too high and causing gout or kidney stones or both. WHAT DO I NEED TO KNOW ABOUT THIS DIET?  Choose low-purine foods. Examples of low-purine foods are listed in the next section.  Drink plenty of fluids, especially water. Fluids can help remove uric acid from your body. Try to drink 8-16 cups (1.9-3.8 L) a day.  Limit foods high in fat, especially saturated fat, as fat makes it harder for the body to get rid of uric acid. Foods high in saturated fat include pizza, cheese, ice cream, whole milk, fried foods, and gravies. Choose foods that are lower in fat and lean sources of protein. Use olive oil when cooking as it contains healthy fats that are not high in saturated fat.  Limit alcohol. Alcohol interferes with the elimination of uric acid from your body. If you are having a gout attack, avoid all alcohol.  Keep in mind that different people's bodies react differently to different foods. You will probably learn over time which foods do or do not affect you. If you discover that a food tends to cause your gout to flare up, avoid eating that food. You can more freely enjoy foods that do not cause problems. If you have any questions about a food item, talk to your dietitian or health care provider. WHICH FOODS  ARE LOW, MODERATE, AND HIGH IN PURINES? The following is a list of foods that are low, moderate, and high in purines. You can eat any amount of the foods that are low in purines. You may be able to have small amounts of foods that are moderate in purines. Ask your health care provider how much of a food moderate in purines you can have. Avoid foods high in purines. Grains  Foods low in purines: Enriched white bread, pasta, rice, cake, cornbread, popcorn.  Foods moderate in purines: Whole-grain breads and cereals, wheat germ, bran, oatmeal. Uncooked oatmeal. Dry wheat bran or wheat germ.  Foods high in purines: Pancakes, [place] toast, biscuits, muffins. Vegetables  Foods low in purines: All vegetables, except those that are moderate in purines.  Foods moderate in purines: Asparagus, cauliflower, spinach, mushrooms, green peas. Fruits  All fruits are low in purines. Meats and other Protein Foods  Foods low in purines: Eggs, nuts, peanut butter.  Foods moderate in purines: 80-90% lean beef, lamb, veal, pork, poultry, fish, eggs, peanut butter, nuts. Crab, lobster, oysters, and shrimp. Cooked dried beans, peas, and lentils.  Foods high in purines: Anchovies, sardines, herring, mussels, tuna, codfish, scallops, trout, and haddock. Tomasa BlaseBacon. Organ meats (such as liver or kidney). Tripe. Game meat. Goose. Sweetbreads. Dairy  All dairy foods are low in purines. Low-fat and fat-free dairy products are best because they are low in saturated fat. Beverages  Drinks low in purines: Water, carbonated beverages, tea, coffee, cocoa.  Drinks moderate in purines: Soft drinks and other drinks sweetened with high-fructose corn syrup. Juices. To find whether a food or drink is sweetened with high-fructose corn syrup, look at the ingredients list.  Drinks high in purines: Alcoholic beverages (such as beer). Condiments  Foods low in purines: Salt, herbs, olives, pickles, relishes, vinegar.  Foods  moderate in purines: Butter, margarine, oils, mayonnaise. Fats and Oils  Foods low in purines: All types, except gravies and sauces made with meat.  Foods high in purines: Gravies and sauces made with meat. Other Foods  Foods low in purines: Sugars, sweets, gelatin. Cake. Soups made without meat.  Foods moderate in purines: Meat-based or fish-based soups, broths, or bouillons. Foods and drinks sweetened with high-fructose corn syrup.  Foods high in purines: High-fat desserts (such as ice cream, cookies, cakes, pies, doughnuts, and chocolate). Contact your dietitian for more information on foods that are not listed here. Document Released: 07/29/2010 Document Revised: 04/08/2013 Document Reviewed: 03/10/2013 [facility] Patient Information 2015 [place], [place]. This information is not intended to replace advice given to you by your health care provider. Make sure you discuss any questions you have with your health care provider.

## 2013-12-01 NOTE — ED Provider Notes (Signed)
Medical screening examination/treatment/procedure(s) were performed by non-physician practitioner and as supervising physician I was immediately available for consultation/collaboration.   EKG Interpretation None        Odeth Bry, DO 12/01/13 1557 

## 2014-01-08 ENCOUNTER — Emergency Department (HOSPITAL_COMMUNITY)
Admission: EM | Admit: 2014-01-08 | Discharge: 2014-01-08 | Disposition: A | Discharge status: Home / Self Care | Payer: Medicaid Other | Attending: Emergency Medicine | Admitting: Emergency Medicine

## 2014-01-08 ENCOUNTER — Encounter (HOSPITAL_COMMUNITY): Payer: Self-pay | Admitting: Emergency Medicine

## 2014-01-08 DIAGNOSIS — Z862 Personal history of diseases of the blood and blood-forming organs and certain disorders involving the immune mechanism: Secondary | ICD-10-CM | POA: Diagnosis not present

## 2014-01-08 DIAGNOSIS — M109 Gout, unspecified: Secondary | ICD-10-CM | POA: Diagnosis not present

## 2014-01-08 DIAGNOSIS — Z872 Personal history of diseases of the skin and subcutaneous tissue: Secondary | ICD-10-CM | POA: Diagnosis not present

## 2014-01-08 DIAGNOSIS — F17200 Nicotine dependence, unspecified, uncomplicated: Secondary | ICD-10-CM | POA: Diagnosis not present

## 2014-01-08 DIAGNOSIS — Z880 Allergy status to penicillin: Secondary | ICD-10-CM | POA: Insufficient documentation

## 2014-01-08 DIAGNOSIS — Z8719 Personal history of other diseases of the digestive system: Secondary | ICD-10-CM | POA: Insufficient documentation

## 2014-01-08 MED ORDER — COLCHICINE 0.6 MG PO TABS: 0.6000 mg | ORAL_TABLET | Freq: Every day | ORAL | Status: DC

## 2014-01-08 MED ORDER — TRAMADOL HCL 50 MG PO TABS: 50.0000 mg | ORAL_TABLET | Freq: Four times a day (QID) | ORAL | Status: DC | PRN

## 2014-01-08 NOTE — ED Notes (Signed)
Patient c/o left foot pain; states has a history of gout.  States his doctor can't see him until 11/3.  Patient was told to come to ED for gout medication.

## 2014-01-08 NOTE — ED Notes (Signed)
Pt c/o chronic pain to lower extremities, pt self-reporting gout flare-up. Pt ambulatory from wheelchair to ED bed, able to bear full weight with some/minimal degree of discomfort. Pt states he has scheduled an appt to see a Dr regarding gout meds but unable to be seen until the first week in November.

## 2014-01-08 NOTE — ED Provider Notes (Signed)
CSN: [id number]     Arrival date & time 01/08/14  1933 History   First MD Initiated Contact with Patient 01/08/14 2019     Chief Complaint  Patient presents with  . Gout     (Consider location/radiation/quality/duration/timing/severity/associated sxs/prior Treatment) HPI  [name] is a 28 y.o. male who presents for evaluation of left foot pain that feels like his usual gout. He is out of his usual medications. He, states that his medical doctor will not prescribe these medicines, for him. He has frequent visits to the emergency department and usually gets colchicine and some form of narcotic tablet. He denies fever, chills, nausea, vomiting, weakness, or dizziness. He came here by private vehicle. There are no other known modifying factors.   Past Medical History  Diagnosis Date  . Gout   . Cellulitis   . Obesity   . Erosive esophagitis 09/01/2012    NSAID induced.  . Gastric ulcer with hemorrhage 09/02/2012    s/p bleeding control tx. Per Dr. Jena Gauss  . Acute blood loss anemia 09/02/2012    S/p 1 unit rbcs   Past Surgical History  Procedure Laterality Date  . Tonsillectomy    . Esophagogastroduodenoscopy Left 09/01/2012    NWG:NFAOZH esophageal erosions consistent with erosive reflux esophagitis/Pre-pyloric benign-appearing gastric ulcer with bleeding stigmata status post bleeding control therapy as described above  . Esophagogastroduodenoscopy N/A 12/13/2012    Procedure: ESOPHAGOGASTRODUODENOSCOPY (EGD);  Surgeon: Corbin Ade, MD;  Location: [facility];  Service: Endoscopy;  Laterality: N/A;  11:30   Family History  Problem Relation Age of Onset  . Heart failure Mother    History  Substance Use Topics  . Smoking status: Light Tobacco Smoker -- 0.25 packs/day for 7 years    Types: Cigarettes  . Smokeless tobacco: Current User    Types: Snuff  . Alcohol Use: No    Review of Systems  All other systems reviewed and are negative.     Allergies  Asa;  Nsaids; Penicillins; and Sulfa antibiotics  Home Medications   Prior to Admission medications   Medication Sig Start Date End Date Taking? Authorizing Provider  colchicine 0.6 MG tablet Take 1 tablet (0.6 mg total) by mouth daily. 01/08/14   Flint Melter, MD  traMADol (ULTRAM) 50 MG tablet Take 1 tablet (50 mg total) by mouth every 6 (six) hours as needed. 01/08/14   Flint Melter, MD   BP 139/55  Pulse 80  Temp(Src) 98 F (36.7 C) (Oral)  Resp 18  Ht  (1.702 m)  Wt 417 lb (189.15 kg)  BMI 65.30 kg/m2  SpO2 100% Physical Exam  Nursing note and vitals reviewed. Constitutional: He is oriented to person, place, and time. He appears well-developed.  Morbidly obese  HENT:  Head: Normocephalic and atraumatic.  Right Ear: External ear normal.  Left Ear: External ear normal.  Eyes: Conjunctivae and EOM are normal. Pupils are equal, round, and reactive to light.  Neck: Normal range of motion and phonation normal. Neck supple.  Cardiovascular: Normal rate.   Pulmonary/Chest: Effort normal. He exhibits no bony tenderness.  Abdominal: Soft. There is no tenderness.  Musculoskeletal: Normal range of motion.  Left foot, mildly swollen, mid aspect, without redness or deformity.  Neurological: He is alert and oriented to person, place, and time. No cranial nerve deficit or sensory deficit. He exhibits normal muscle tone. Coordination normal.  Skin: Skin is warm, dry and intact.  Scattered plaque-like psoriasis  Psychiatric: He has  a normal mood and affect. His behavior is normal. Judgment and thought content normal.    ED Course  Procedures (including critical care time)  Findings discussed with patient, all questions answered  Labs Review Labs Reviewed - No data to display  Imaging Review No results found.   EKG Interpretation None      MDM   Final diagnoses:  Gout of left foot, unspecified cause, unspecified chronicity    Nursing Notes Reviewed/ Care  Coordinated Applicable Imaging Reviewed Interpretation of Laboratory Data incorporated into ED treatment  The patient appears reasonably screened and/or stabilized for discharge and I doubt any other medical condition or other [facility] requiring further screening, evaluation, or treatment in the ED at this time prior to discharge.  Plan: Home Medications- Colchicine, Tramadol; Home Treatments- rest; return here if the recommended treatment, does not improve the symptoms; Recommended follow up- PCP asap     Flint Melter, MD 01/08/14 [phone]

## 2014-01-08 NOTE — Discharge Instructions (Signed)
Gout Gout is an inflammatory arthritis caused by a buildup of uric acid crystals in the joints. Uric acid is a chemical that is normally present in the blood. When the level of uric acid in the blood is too high it can form crystals that deposit in your joints and tissues. This causes joint redness, soreness, and swelling (inflammation). Repeat attacks are common. Over time, uric acid crystals can form into masses (tophi) near a joint, destroying bone and causing disfigurement. Gout is treatable and often preventable. CAUSES  The disease begins with elevated levels of uric acid in the blood. Uric acid is produced by your body when it breaks down a naturally found substance called purines. Certain foods you eat, such as meats and fish, contain high amounts of purines. Causes of an elevated uric acid level include:  Being passed down from parent to child (heredity).  Diseases that cause increased uric acid production (such as obesity, psoriasis, and certain cancers).  Excessive alcohol use.  Diet, especially diets rich in meat and seafood.  Medicines, including certain cancer-fighting medicines (chemotherapy), water pills (diuretics), and aspirin.  Chronic kidney disease. The kidneys are no longer able to remove uric acid well.  Problems with metabolism. Conditions strongly associated with gout include:  Obesity.  High blood pressure.  High cholesterol.  Diabetes. Not everyone with elevated uric acid levels gets gout. It is not understood why some people get gout and others do not. Surgery, joint injury, and eating too much of certain foods are some of the factors that can lead to gout attacks. SYMPTOMS   An attack of gout comes on quickly. It causes intense pain with redness, swelling, and warmth in a joint.  Fever can occur.  Often, only one joint is involved. Certain joints are more commonly involved:  Base of the big toe.  Knee.  Ankle.  Wrist.  Finger. Without  treatment, an attack usually goes away in a few days to weeks. Between attacks, you usually will not have symptoms, which is different from many other forms of arthritis. DIAGNOSIS  Your caregiver will suspect gout based on your symptoms and exam. In some cases, tests may be recommended. The tests may include:  Blood tests.  Urine tests.  X-rays.  Joint fluid exam. This exam requires a needle to remove fluid from the joint (arthrocentesis). Using a microscope, gout is confirmed when uric acid crystals are seen in the joint fluid. TREATMENT  There are two phases to gout treatment: treating the sudden onset (acute) attack and preventing attacks (prophylaxis).  Treatment of an Acute Attack.  Medicines are used. These include anti-inflammatory medicines or steroid medicines.  An injection of steroid medicine into the affected joint is sometimes necessary.  The painful joint is rested. Movement can worsen the arthritis.  You may use warm or cold treatments on painful joints, depending which works best for you.  Treatment to Prevent Attacks.  If you suffer from frequent gout attacks, your caregiver may advise preventive medicine. These medicines are started after the acute attack subsides. These medicines either help your kidneys eliminate uric acid from your body or decrease your uric acid production. You may need to stay on these medicines for a very long time.  The early phase of treatment with preventive medicine can be associated with an increase in acute gout attacks. For this reason, during the first few months of treatment, your caregiver may also advise you to take medicines usually used for acute gout treatment. Be sure you   understand your caregiver's directions. Your caregiver may make several adjustments to your medicine dose before these medicines are effective.  Discuss dietary treatment with your caregiver or dietitian. Alcohol and drinks high in sugar and fructose and foods  such as meat, poultry, and seafood can increase uric acid levels. Your caregiver or dietitian can advise you on drinks and foods that should be limited. HOME CARE INSTRUCTIONS   Do not take aspirin to relieve pain. This raises uric acid levels.  Only take over-the-counter or prescription medicines for pain, discomfort, or fever as directed by your caregiver.  Rest the joint as much as possible. When in bed, keep sheets and blankets off painful areas.  Keep the affected joint raised (elevated).  Apply warm or cold treatments to painful joints. Use of warm or cold treatments depends on which works best for you.  Use crutches if the painful joint is in your leg.  Drink enough fluids to keep your urine clear or pale yellow. This helps your body get rid of uric acid. Limit alcohol, sugary drinks, and fructose drinks.  Follow your dietary instructions. Pay careful attention to the amount of protein you eat. Your daily diet should emphasize fruits, vegetables, whole grains, and fat-free or low-fat milk products. Discuss the use of coffee, vitamin C, and cherries with your caregiver or dietitian. These may be helpful in lowering uric acid levels.  Maintain a healthy body weight. SEEK MEDICAL CARE IF:   You develop diarrhea, vomiting, or any side effects from medicines.  You do not feel better in 24 hours, or you are getting worse. SEEK IMMEDIATE MEDICAL CARE IF:   Your joint becomes suddenly more tender, and you have chills or a fever. MAKE SURE YOU:   Understand these instructions.  Will watch your condition.  Will get help right away if you are not doing well or get worse. Document Released: 03/31/2000 Document Revised: 08/18/2013 Document Reviewed: 11/15/2011 [employer] Patient Information 2015 [employer]. This information is not intended to replace advice given to you by your health care provider. Make sure you discuss any questions you have with your health care  provider. Low-Purine Diet Purines are compounds that affect the level of uric acid in your body. A low-purine diet is a diet that is low in purines. Eating a low-purine diet can prevent the level of uric acid in your body from getting too high and causing gout or kidney stones or both. WHAT DO I NEED TO KNOW ABOUT THIS DIET?  Choose low-purine foods. Examples of low-purine foods are listed in the next section.  Drink plenty of fluids, especially water. Fluids can help remove uric acid from your body. Try to drink 8-16 cups (1.9-3.8 L) a day.  Limit foods high in fat, especially saturated fat, as fat makes it harder for the body to get rid of uric acid. Foods high in saturated fat include pizza, cheese, ice cream, whole milk, fried foods, and gravies. Choose foods that are lower in fat and lean sources of protein. Use olive oil when cooking as it contains healthy fats that are not high in saturated fat.  Limit alcohol. Alcohol interferes with the elimination of uric acid from your body. If you are having a gout attack, avoid all alcohol.  Keep in mind that different people's bodies react differently to different foods. You will probably learn over time which foods do or do not affect you. If you discover that a food tends to cause your gout to flare up,   avoid eating that food. You can more freely enjoy foods that do not cause problems. If you have any questions about a food item, talk to your dietitian or health care provider. WHICH FOODS ARE LOW, MODERATE, AND HIGH IN PURINES? The following is a list of foods that are low, moderate, and high in purines. You can eat any amount of the foods that are low in purines. You may be able to have small amounts of foods that are moderate in purines. Ask your health care provider how much of a food moderate in purines you can have. Avoid foods high in purines. Grains  Foods low in purines: Enriched white bread, pasta, rice, cake, cornbread, popcorn.  Foods  moderate in purines: Whole-grain breads and cereals, wheat germ, bran, oatmeal. Uncooked oatmeal. Dry wheat bran or wheat germ.  Foods high in purines: Pancakes, French toast, biscuits, muffins. Vegetables  Foods low in purines: All vegetables, except those that are moderate in purines.  Foods moderate in purines: Asparagus, cauliflower, spinach, mushrooms, green peas. Fruits  All fruits are low in purines. Meats and other Protein Foods  Foods low in purines: Eggs, nuts, peanut butter.  Foods moderate in purines: 80-90% lean beef, lamb, veal, pork, poultry, fish, eggs, peanut butter, nuts. Crab, lobster, oysters, and shrimp. Cooked dried beans, peas, and lentils.  Foods high in purines: Anchovies, sardines, herring, mussels, tuna, codfish, scallops, trout, and haddock. Bacon. Organ meats (such as liver or kidney). Tripe. Game meat. Goose. Sweetbreads. Dairy  All dairy foods are low in purines. Low-fat and fat-free dairy products are best because they are low in saturated fat. Beverages  Drinks low in purines: Water, carbonated beverages, tea, coffee, cocoa.  Drinks moderate in purines: Soft drinks and other drinks sweetened with high-fructose corn syrup. Juices. To find whether a food or drink is sweetened with high-fructose corn syrup, look at the ingredients list.  Drinks high in purines: Alcoholic beverages (such as beer). Condiments  Foods low in purines: Salt, herbs, olives, pickles, relishes, vinegar.  Foods moderate in purines: Butter, margarine, oils, mayonnaise. Fats and Oils  Foods low in purines: All types, except gravies and sauces made with meat.  Foods high in purines: Gravies and sauces made with meat. Other Foods  Foods low in purines: Sugars, sweets, gelatin. Cake. Soups made without meat.  Foods moderate in purines: Meat-based or fish-based soups, broths, or bouillons. Foods and drinks sweetened with high-fructose corn syrup.  Foods high in purines:  High-fat desserts (such as ice cream, cookies, cakes, pies, doughnuts, and chocolate). Contact your dietitian for more information on foods that are not listed here. Document Released: 07/29/2010 Document Revised: 04/08/2013 Document Reviewed: 03/10/2013 [employer] Patient Information 2015 [employer]. This information is not intended to replace advice given to you by your health care provider. Make sure you discuss any questions you have with your health care provider.  

## 2014-01-08 NOTE — ED Notes (Signed)
Pt verbalized understanding of d/c instructions and directions for follow-up care. All pt's questions answered; pt voices no additional concerns.

## 2014-01-21 DIAGNOSIS — G629 Polyneuropathy, unspecified: Secondary | ICD-10-CM | POA: Diagnosis not present

## 2014-01-21 DIAGNOSIS — Z880 Allergy status to penicillin: Secondary | ICD-10-CM | POA: Diagnosis not present

## 2014-01-21 DIAGNOSIS — Z862 Personal history of diseases of the blood and blood-forming organs and certain disorders involving the immune mechanism: Secondary | ICD-10-CM | POA: Diagnosis not present

## 2014-01-21 DIAGNOSIS — M79671 Pain in right foot: Secondary | ICD-10-CM | POA: Diagnosis not present

## 2014-01-21 DIAGNOSIS — G8929 Other chronic pain: Secondary | ICD-10-CM | POA: Diagnosis not present

## 2014-01-21 DIAGNOSIS — Z8719 Personal history of other diseases of the digestive system: Secondary | ICD-10-CM | POA: Diagnosis not present

## 2014-01-21 DIAGNOSIS — E669 Obesity, unspecified: Secondary | ICD-10-CM | POA: Insufficient documentation

## 2014-01-21 DIAGNOSIS — Z872 Personal history of diseases of the skin and subcutaneous tissue: Secondary | ICD-10-CM | POA: Insufficient documentation

## 2014-01-21 DIAGNOSIS — Z79899 Other long term (current) drug therapy: Secondary | ICD-10-CM | POA: Diagnosis not present

## 2014-01-21 DIAGNOSIS — M79672 Pain in left foot: Secondary | ICD-10-CM | POA: Diagnosis present

## 2014-01-21 DIAGNOSIS — Z720 Tobacco use: Secondary | ICD-10-CM | POA: Diagnosis not present

## 2014-01-22 ENCOUNTER — Encounter (HOSPITAL_COMMUNITY): Payer: Self-pay | Admitting: Emergency Medicine

## 2014-01-22 ENCOUNTER — Emergency Department (HOSPITAL_COMMUNITY)
Admission: EM | Admit: 2014-01-22 | Discharge: 2014-01-22 | Disposition: A | Discharge status: Home / Self Care | Payer: Medicaid Other | Attending: Emergency Medicine | Admitting: Emergency Medicine

## 2014-01-22 DIAGNOSIS — G8929 Other chronic pain: Secondary | ICD-10-CM

## 2014-01-22 DIAGNOSIS — G629 Polyneuropathy, unspecified: Secondary | ICD-10-CM

## 2014-01-22 MED ORDER — TRAMADOL HCL 50 MG PO TABS
50.0000 mg | ORAL_TABLET | Freq: Once | ORAL | Status: AC
Start: 2014-01-22 — End: 2014-01-22
  Administered 2014-01-22: 50 mg via ORAL
  Filled 2014-01-22: qty 1

## 2014-01-22 MED ORDER — TRAMADOL HCL 50 MG PO TABS: 50.0000 mg | ORAL_TABLET | Freq: Four times a day (QID) | ORAL | Status: DC | PRN

## 2014-01-22 NOTE — Discharge Instructions (Signed)
Chronic Pain Chronic pain can be defined as pain that is off and on and lasts for 3-6 months or longer. Many things cause chronic pain, which can make it difficult to make a diagnosis. There are many treatment options available for chronic pain. However, finding a treatment that works well for you may require trying various approaches until the right one is found. Many people benefit from a combination of two or more types of treatment to control their pain. SYMPTOMS  Chronic pain can occur anywhere in the body and can range from mild to very severe. Some types of chronic pain include:  Headache.  Low back pain.  Cancer pain.  Arthritis pain.  Neurogenic pain. This is pain resulting from damage to nerves. People with chronic pain may also have other symptoms such as:  Depression.  Anger.  Insomnia.  Anxiety. DIAGNOSIS  Your health care provider will help diagnose your condition over time. In many cases, the initial focus will be on excluding possible conditions that could be causing the pain. Depending on your symptoms, your health care provider may order tests to diagnose your condition. Some of these tests may include:   Blood tests.   CT scan.   MRI.   X-rays.   Ultrasounds.   Nerve conduction studies.  You may need to see a specialist.  TREATMENT  Finding treatment that works well may take time. You may be referred to a pain specialist. He or she may prescribe medicine or therapies, such as:   Mindful meditation or yoga.  Shots (injections) of numbing or pain-relieving medicines into the spine or area of pain.  Local electrical stimulation.  Acupuncture.   Massage therapy.   Aroma, color, light, or sound therapy.   Biofeedback.   Working with a physical therapist to keep from getting stiff.   Regular, gentle exercise.   Cognitive or behavioral therapy.   Group support.  Sometimes, surgery may be recommended.  HOME CARE INSTRUCTIONS    Take all medicines as directed by your health care provider.   Lessen stress in your life by relaxing and doing things such as listening to calming music.   Exercise or be active as directed by your health care provider.   Eat a healthy diet and include things such as vegetables, fruits, fish, and lean meats in your diet.   Keep all follow-up appointments with your health care provider.   Attend a support group with others suffering from chronic pain. SEEK MEDICAL CARE IF:   Your pain gets worse.   You develop a new pain that was not there before.   You cannot tolerate medicines given to you by your health care provider.   You have new symptoms since your last visit with your health care provider.  SEEK IMMEDIATE MEDICAL CARE IF:   You feel weak.   You have decreased sensation or numbness.   You lose control of bowel or bladder function.   Your pain suddenly gets much worse.   You develop shaking.  You develop chills.  You develop confusion.  You develop chest pain.  You develop shortness of breath.  MAKE SURE YOU:  Understand these instructions.  Will watch your condition.  Will get help right away if you are not doing well or get worse. Document Released: 12/24/2001 Document Revised: 12/04/2012 Document Reviewed: 09/27/2012 [facility] Patient Information 2015 [place], [place]. This information is not intended to replace advice given to you by your health care provider. Make sure you discuss any  questions you have with your health care provider.  Peripheral Neuropathy Peripheral neuropathy is a type of nerve damage. It affects nerves that carry signals between the spinal cord and other parts of the body. These are called peripheral nerves. With peripheral neuropathy, one nerve or a group of nerves may be damaged.  CAUSES  Many things can damage peripheral nerves. For some people with peripheral neuropathy, the cause is unknown. Some causes  include:  Diabetes. This is the most common cause of peripheral neuropathy.  Injury to a nerve.  Pressure or stress on a nerve that lasts a long time.  Too little vitamin B. Alcoholism can lead to this.  Infections.  Autoimmune diseases, such as multiple sclerosis and systemic lupus erythematosus.  Inherited nerve diseases.  Some medicines, such as cancer drugs.  Toxic substances, such as lead and mercury.  Too little blood flowing to the legs.  Kidney disease.  Thyroid disease. SIGNS AND SYMPTOMS  Different people have different symptoms. The symptoms you have will depend on which of your nerves is damaged. Common symptoms include:  Loss of feeling (numbness) in the feet and hands.  Tingling in the feet and hands.  Pain that burns.  Very sensitive skin.  Weakness.  Not being able to move a part of the body (paralysis).  Muscle twitching.  Clumsiness or poor coordination.  Loss of balance.  Not being able to control your bladder.  Feeling dizzy.  Sexual problems. DIAGNOSIS  Peripheral neuropathy is a symptom, not a disease. Finding the cause of peripheral neuropathy can be hard. To figure that out, your health care provider will take a medical history and do a physical exam. A neurological exam will also be done. This involves checking things affected by your brain, spinal cord, and nerves (nervous system). For example, your health care provider will check your reflexes, how you move, and what you can feel.  Other types of tests may also be ordered, such as:  Blood tests.  A test of the fluid in your spinal cord.  Imaging tests, such as CT scans or an MRI.  Electromyography (EMG). This test checks the nerves that control muscles.  Nerve conduction velocity tests. These tests check how fast messages pass through your nerves.  Nerve biopsy. A small piece of nerve is removed. It is then checked under a microscope. TREATMENT   Medicine is often used to  treat peripheral neuropathy. Medicines may include:  Pain-relieving medicines. Prescription or over-the-counter medicine may be suggested.  Antiseizure medicine. This may be used for pain.  Antidepressants. These also may help ease pain from neuropathy.  Lidocaine. This is a numbing medicine. You might wear a patch or be given a shot.  Mexiletine. This medicine is typically used to help control irregular heart rhythms.  Surgery. Surgery may be needed to relieve pressure on a nerve or to destroy a nerve that is causing pain.  Physical therapy to help movement.  Assistive devices to help movement. HOME CARE INSTRUCTIONS   Only take over-the-counter or prescription medicines as directed by your health care provider. Follow the instructions carefully for any given medicines. Do not take any other medicines without first getting approval from your health care provider.  If you have diabetes, work closely with your health care provider to keep your blood sugar under control.  If you have numbness in your feet:  Check every day for signs of injury or infection. Watch for redness, warmth, and swelling.  Wear padded socks and comfortable  shoes. These help protect your feet.  Do not do things that put pressure on your damaged nerve.  Do not smoke. Smoking keeps blood from getting to damaged nerves.  Avoid or limit alcohol. Too much alcohol can cause a lack of B vitamins. These vitamins are needed for healthy nerves.  Develop a good support system. Coping with peripheral neuropathy can be stressful. Talk to a mental health specialist or join a support group if you are struggling.  Follow up with your health care provider as directed. SEEK MEDICAL CARE IF:   You have new signs or symptoms of peripheral neuropathy.  You are struggling emotionally from dealing with peripheral neuropathy.  You have a fever. SEEK IMMEDIATE MEDICAL CARE IF:   You have an injury or infection that is not  healing.  You feel very dizzy or begin vomiting.  You have chest pain.  You have trouble breathing. Document Released: 03/24/2002 Document Revised: 12/14/2010 Document Reviewed: 12/09/2012 [facility] Patient Information 2015 [place]. This information is not intended to replace advice given to you by your health care provider. Make sure you discuss any questions you have with your health care provider.

## 2014-01-22 NOTE — ED Provider Notes (Signed)
TIME SEEN: 12:00 AM  CHIEF COMPLAINT: Bilateral foot pain  HPI: Patient is a 28 y.o. M with history of obesity, gout, neuropathy who presents to the emergency department with complaints of bilateral foot pain. Patient has had this condition chronically. He states he was recently seen in the emergency department and told he had gout retrosternal called to scene. He reports no improvement in his pain. He has been taking Tylenol at home. He cannot take NSAIDs secondary to a history of gastric ulcer with hemorrhage. Denies any redness or warmth. No history of injury. He does have tingling in bilateral feet and burning which he reports is chronic. He states he has an appointment with her primary care physician but cannot be seen until November. Denies fevers or chills. Denies nausea, vomiting or diarrhea. Denies focal weakness.  ROS: See HPI Constitutional: no fever  Eyes: no drainage  ENT: no runny nose   Cardiovascular:  no chest pain  Resp: no SOB  GI: no vomiting GU: no dysuria Integumentary: no rash  Allergy: no hives  Musculoskeletal: no leg swelling  Neurological: no slurred speech ROS otherwise negative  PAST MEDICAL HISTORY/PAST SURGICAL HISTORY:  Past Medical History  Diagnosis Date  . Gout   . Cellulitis   . Obesity   . Erosive esophagitis 09/01/2012    NSAID induced.  . Gastric ulcer with hemorrhage 09/02/2012    s/p bleeding control tx. Per Dr. Jena Gauss  . Acute blood loss anemia 09/02/2012    S/p 1 unit rbcs    MEDICATIONS:  Prior to Admission medications   Medication Sig Start Date End Date Taking? Authorizing Provider  colchicine 0.6 MG tablet Take 1 tablet (0.6 mg total) by mouth daily. 01/08/14   Flint Melter, MD  traMADol (ULTRAM) 50 MG tablet Take 1 tablet (50 mg total) by mouth every 6 (six) hours as needed. 01/08/14   Flint Melter, MD  traMADol (ULTRAM) 50 MG tablet Take 1 tablet (50 mg total) by mouth every 6 (six) hours as needed. 01/22/14   Mieko Kneebone N Trenton Verne, DO     ALLERGIES:  Allergies  Allergen Reactions  . Asa [Aspirin] Other (See Comments)    Pt doesn't know reaction.  . Nsaids Other (See Comments)    Causes Ulcers   . Penicillins Other (See Comments)    Pt doesn't know reaction.  . Sulfa Antibiotics Other (See Comments)    Pt doesn't know reaction.    SOCIAL HISTORY:  History  Substance Use Topics  . Smoking status: Light Tobacco Smoker -- 0.25 packs/day for 7 years    Types: Cigarettes  . Smokeless tobacco: Current User    Types: Snuff  . Alcohol Use: No    FAMILY HISTORY: Family History  Problem Relation Age of Onset  . Heart failure Mother     EXAM: BP 133/98  Pulse 89  Temp(Src) 98.1 F (36.7 C) (Oral)  Resp 16  Ht 5\' 7"  (1.702 m)  Wt 417 lb (189.15 kg)  BMI 65.30 kg/m2  SpO2 100% CONSTITUTIONAL: Alert and oriented and responds appropriately to questions. Well-appearing; well-nourished HEAD: Normocephalic EYES: Conjunctivae clear, PERRL ENT: normal nose; no rhinorrhea; moist mucous membranes; pharynx without lesions noted NECK: Supple, no meningismus, no LAD  CARD: RRR; S1 and S2 appreciated; no murmurs, no clicks, no rubs, no gallops RESP: Normal chest excursion without splinting or tachypnea; breath sounds clear and equal bilaterally; no wheezes, no rhonchi, no rales,  ABD/GI: Normal bowel sounds; non-distended; soft, non-tender, no rebound,  no guarding BACK:  The back appears normal and is non-tender to palpation, there is no CVA tenderness EXT: Bilateral feet are diffusely tender to palpation but when patient is distracted his exam is benign, he does have mild pedal edema bilaterally, 2+ DP pulses bilaterally, no erythema or warmth or induration or fluctuance, there is no joint effusion, no bony injury, compartment soft, no calf tenderness or swelling, Normal ROM in all joints; otherwise extremities are non-tender to palpation; no edema; normal capillary refill; no cyanosis    SKIN: Normal color for age and  race; warm NEURO: Moves all extremities equally PSYCH: The patient's mood and manner are appropriate. Grooming and personal hygiene are appropriate.  MEDICAL DECISION MAKING: Patient here with complaints of bilateral foot pain which he is here frequently for.  There is no sign of cellulitis, gout, septic arthritis, DVT on exam. Suspect this is secondary to his chronic neuropathy. We'll discharge with a short prescription for tramadol. Have discussed with patient that given this is a chronic symptom that the emergency department is not the appropriate place to have this treated and I recommend he try to contact his PCP to see if he can be seen sooner. Discussed return precautions. He verbalizes understanding and is comfortable with plan.       Layla MawKristen N Ashaunte Standley, DO 01/22/14 [phone]

## 2014-01-22 NOTE — ED Notes (Signed)
Feet are swelling, was seen for the same thing here around 1 week ago. Can't get into see my md until November. Think it is gout per pt.

## 2014-02-09 ENCOUNTER — Emergency Department (HOSPITAL_COMMUNITY): Payer: Medicaid Other

## 2014-02-09 ENCOUNTER — Inpatient Hospital Stay (HOSPITAL_COMMUNITY)
Admission: EM | Admit: 2014-02-09 | Discharge: 2014-02-11 | DRG: 445 | Disposition: A | Discharge status: Home / Self Care | Payer: Medicaid Other | Attending: Family Medicine | Admitting: Family Medicine

## 2014-02-09 ENCOUNTER — Encounter (HOSPITAL_COMMUNITY): Payer: Self-pay | Admitting: Emergency Medicine

## 2014-02-09 DIAGNOSIS — R1011 Right upper quadrant pain: Secondary | ICD-10-CM | POA: Diagnosis present

## 2014-02-09 DIAGNOSIS — R109 Unspecified abdominal pain: Secondary | ICD-10-CM

## 2014-02-09 DIAGNOSIS — Z6841 Body Mass Index (BMI) 40.0 and over, adult: Secondary | ICD-10-CM

## 2014-02-09 DIAGNOSIS — K8020 Calculus of gallbladder without cholecystitis without obstruction: Secondary | ICD-10-CM | POA: Diagnosis present

## 2014-02-09 DIAGNOSIS — Z8711 Personal history of peptic ulcer disease: Secondary | ICD-10-CM

## 2014-02-09 DIAGNOSIS — F17210 Nicotine dependence, cigarettes, uncomplicated: Secondary | ICD-10-CM | POA: Diagnosis present

## 2014-02-09 DIAGNOSIS — Z8249 Family history of ischemic heart disease and other diseases of the circulatory system: Secondary | ICD-10-CM

## 2014-02-09 HISTORY — DX: Urinary tract infection, site not specified: N39.0

## 2014-02-09 LAB — COMPREHENSIVE METABOLIC PANEL
ALBUMIN: 3.4 g/dL — AB (ref 3.5–5.2)
ALK PHOS: 74 U/L (ref 39–117)
ALT: 19 U/L (ref 0–53)
ANION GAP: 11 (ref 5–15)
AST: 13 U/L (ref 0–37)
BILIRUBIN TOTAL: 0.2 mg/dL — AB (ref 0.3–1.2)
BUN: 9 mg/dL (ref 6–23)
CO2: 27 mEq/L (ref 19–32)
CREATININE: 0.69 mg/dL (ref 0.50–1.35)
Calcium: 9.2 mg/dL (ref 8.4–10.5)
Chloride: 102 mEq/L (ref 96–112)
GFR calc non Af Amer: >90 mL/min (ref >90)
GLUCOSE: 98 mg/dL (ref 70–99)
POTASSIUM: 3.7 meq/L (ref 3.7–5.3)
Sodium: 140 mEq/L (ref 137–147)
Total Protein: 7 g/dL (ref 6.0–8.3)

## 2014-02-09 LAB — CBC WITH DIFFERENTIAL/PLATELET
BASOS PCT: 0 % (ref 0–1)
Basophils Absolute: 0 10*3/uL (ref 0.0–0.1)
Eosinophils Absolute: 0.5 10*3/uL (ref 0.0–0.7)
Eosinophils Relative: 6 % — ABNORMAL HIGH (ref 0–5)
HEMATOCRIT: 40.2 % (ref 39.0–52.0)
HEMOGLOBIN: 13.5 g/dL (ref 13.0–17.0)
LYMPHS ABS: 2.7 10*3/uL (ref 0.7–4.0)
Lymphocytes Relative: 30 % (ref 12–46)
MCH: 28.5 pg (ref 26.0–34.0)
MCHC: 33.6 g/dL (ref 30.0–36.0)
MCV: 85 fL (ref 78.0–100.0)
MONO ABS: 0.7 10*3/uL (ref 0.1–1.0)
MONOS PCT: 8 % (ref 3–12)
NEUTROS ABS: 5 10*3/uL (ref 1.7–7.7)
Neutrophils Relative %: 56 % (ref 43–77)
Platelets: 240 10*3/uL (ref 150–400)
RBC: 4.73 MIL/uL (ref 4.22–5.81)
RDW: 13.6 % (ref 11.5–15.5)
WBC: 9 10*3/uL (ref 4.0–10.5)

## 2014-02-09 LAB — URINALYSIS, ROUTINE W REFLEX MICROSCOPIC
Bilirubin Urine: NEGATIVE
Glucose, UA: NEGATIVE mg/dL
Hgb urine dipstick: NEGATIVE
KETONES UR: NEGATIVE mg/dL
Leukocytes, UA: NEGATIVE
NITRITE: NEGATIVE
PH: 6 (ref 5.0–8.0)
Protein, ur: NEGATIVE mg/dL
Specific Gravity, Urine: 1.025 (ref 1.005–1.030)
Urobilinogen, UA: 0.2 mg/dL (ref 0.0–1.0)

## 2014-02-09 MED ORDER — MORPHINE SULFATE 4 MG/ML IJ SOLN
4.0000 mg | Freq: Once | INTRAMUSCULAR | Status: AC
  Administered 2014-02-09: 4 mg via INTRAVENOUS
  Filled 2014-02-09: qty 1

## 2014-02-09 MED ORDER — IOHEXOL 300 MG/ML  SOLN
25.0000 mL | Freq: Once | INTRAMUSCULAR | Status: AC | PRN
  Administered 2014-02-09: 25 mL via ORAL

## 2014-02-09 MED ORDER — IOHEXOL 300 MG/ML  SOLN
100.0000 mL | Freq: Once | INTRAMUSCULAR | Status: AC | PRN
  Administered 2014-02-09: 100 mL via INTRAVENOUS

## 2014-02-09 MED ORDER — ONDANSETRON HCL 4 MG/2ML IJ SOLN
4.0000 mg | Freq: Once | INTRAMUSCULAR | Status: AC
  Administered 2014-02-09: 4 mg via INTRAVENOUS
  Filled 2014-02-09: qty 2

## 2014-02-09 NOTE — ED Notes (Signed)
Patient reports right flank pain, right groin pain, and urinary retention since last night. Also reports dark urine.

## 2014-02-09 NOTE — ED Provider Notes (Signed)
CSN: [id number]     Arrival date & time 02/09/14  2141 History  This chart was scribed for Steven RollerBrian D Myrick Mcnairy, MD by Modena JanskyAlbert Thayil, ED Scribe. This patient was seen in room [facility]02[facility] and the patient's care was started at 10:07 PM.   Chief Complaint  Patient presents with  . Flank Pain   The history is provided by the patient. No language interpreter was used.   HPI Comments: [name] is a 28 y.o. male who presents to the Emergency Department complaining of constant moderate right sided abdominal pain that started yesterday. He reports that he drank some soda and went to the bathroom and noticed he was having pain and urinary retention. He reports that the pain radiates to his back at times. He states that urination exacerbates the pain. He states that he has associated right groin pain with urination. He reports that he has had some diarrhea.   The diarrhea is loose and he has this weekly.  He has no blood in the stools.  He has passed ~ 100 cc of yellow urine - no tea colored urine.  He has no hx of abd surgery though he did require endoscopy for PUD bleeding due to nsaid use in the past.  This pain is constant, it is not colicky.  Past Medical History  Diagnosis Date  . Gout   . Cellulitis   . Obesity   . Erosive esophagitis 09/01/2012    NSAID induced.  . Gastric ulcer with hemorrhage 09/02/2012    s/p bleeding control tx. Per Dr. Jena Gaussourk  . Acute blood loss anemia 09/02/2012    S/p 1 unit rbcs  . UTI (lower urinary tract infection)    Past Surgical History  Procedure Laterality Date  . Tonsillectomy    . Esophagogastroduodenoscopy Left 09/01/2012    WUJ:WJXBJYRMR:Distal esophageal erosions consistent with erosive reflux esophagitis/Pre-pyloric benign-appearing gastric ulcer with bleeding stigmata status post bleeding control therapy as described above  . Esophagogastroduodenoscopy N/A 12/13/2012    Procedure: ESOPHAGOGASTRODUODENOSCOPY (EGD);  Surgeon: Corbin Adeobert M Rourk, MD;  Location: [facility];  Service: Endoscopy;  Laterality: N/A;  11:30   Family History  Problem Relation Age of Onset  . Heart failure Mother    History  Substance Use Topics  . Smoking status: Light Tobacco Smoker -- 0.25 packs/day for 7 years    Types: Cigarettes  . Smokeless tobacco: Current User    Types: Snuff  . Alcohol Use: No    Review of Systems  Gastrointestinal: Positive for abdominal pain and diarrhea.  Genitourinary: Positive for difficulty urinating.  All other systems reviewed and are negative.  Allergies  Asa; Nsaids; Penicillins; and Sulfa antibiotics  Home Medications   Prior to Admission medications   Medication Sig Start Date End Date Taking? Authorizing Provider  colchicine 0.6 MG tablet Take 1 tablet (0.6 mg total) by mouth daily. 01/08/14   Flint MelterElliott L Wentz, MD  traMADol (ULTRAM) 50 MG tablet Take 1 tablet (50 mg total) by mouth every 6 (six) hours as needed. 01/08/14   Flint MelterElliott L Wentz, MD  traMADol (ULTRAM) 50 MG tablet Take 1 tablet (50 mg total) by mouth every 6 (six) hours as needed. 01/22/14   Kristen N Ward, DO   BP 157/90  Pulse 81  Temp(Src) 98.3 F (36.8 C) (Oral)  Resp 18  Ht 5\' 7"  (1.702 m)  Wt 420 lb (190.511 kg)  BMI 65.77 kg/m2  SpO2 99% Physical Exam  Nursing note and vitals reviewed.  Constitutional: He appears well-developed and well-nourished. No distress.  HENT:  Head: Normocephalic and atraumatic.  Mouth/Throat: Oropharynx is clear and moist. No oropharyngeal exudate.  Eyes: Conjunctivae and EOM are normal. Pupils are equal, round, and reactive to light. Right eye exhibits no discharge. Left eye exhibits no discharge. No scleral icterus.  Neck: Normal range of motion. Neck supple. No JVD present. No thyromegaly present.  Cardiovascular: Normal rate, regular rhythm, normal heart sounds and intact distal pulses.  Exam reveals no gallop and no friction rub.   No murmur heard. Pulmonary/Chest: Effort normal and breath sounds normal. No respiratory  distress. He has no wheezes. He has no rales.  Abdominal: Soft. Bowel sounds are normal. He exhibits no distension and no mass. There is tenderness.  Morbidly obese abdomen, R sided ttp in the RUQ and the R mid and RLQ.  No L sided ttp.  Musculoskeletal: Normal range of motion. He exhibits no edema and no tenderness.  Lymphadenopathy:    He has no cervical adenopathy.  Neurological: He is alert. Coordination normal.  Skin: Skin is warm and dry. Rash ( diffuse psoriatic rash) noted. No erythema.  Psychiatric: He has a normal mood and affect. His behavior is normal.    ED Course  Procedures (including critical care time) DIAGNOSTIC STUDIES: Oxygen Saturation is 99% on RA, normal by my interpretation.    COORDINATION OF CARE: 10:11 PM- Pt advised of plan for treatment which includes medication and labs and pt agrees.   Results for orders placed during the hospital encounter of 02/09/14  CBC WITH DIFFERENTIAL      Result Value Ref Range   WBC 9.0  4.0 - 10.5 K/uL   RBC 4.73  4.22 - 5.81 MIL/uL   Hemoglobin 13.5  13.0 - 17.0 g/dL   HCT 16.140.2  09.639.0 - 04.552.0 %   MCV 85.0  78.0 - 100.0 fL   MCH 28.5  26.0 - 34.0 pg   MCHC 33.6  30.0 - 36.0 g/dL   RDW 40.913.6  81.111.5 - 91.415.5 %   Platelets 240  150 - 400 K/uL   Neutrophils Relative % 56  43 - 77 %   Neutro Abs 5.0  1.7 - 7.7 K/uL   Lymphocytes Relative 30  12 - 46 %   Lymphs Abs 2.7  0.7 - 4.0 K/uL   Monocytes Relative 8  3 - 12 %   Monocytes Absolute 0.7  0.1 - 1.0 K/uL   Eosinophils Relative 6 (*) 0 - 5 %   Eosinophils Absolute 0.5  0.0 - 0.7 K/uL   Basophils Relative 0  0 - 1 %   Basophils Absolute 0.0  0.0 - 0.1 K/uL  COMPREHENSIVE METABOLIC PANEL      Result Value Ref Range   Sodium 140  137 - 147 mEq/L   Potassium 3.7  3.7 - 5.3 mEq/L   Chloride 102  96 - 112 mEq/L   CO2 27  19 - 32 mEq/L   Glucose, Bld 98  70 - 99 mg/dL   BUN 9  6 - 23 mg/dL   Creatinine, Ser 7.820.69  0.50 - 1.35 mg/dL   Calcium 9.2  8.4 - 95.610.5 mg/dL   Total  Protein 7.0  6.0 - 8.3 g/dL   Albumin 3.4 (*) 3.5 - 5.2 g/dL   AST 13  0 - 37 U/L   ALT 19  0 - 53 U/L   Alkaline Phosphatase 74  39 - 117 U/L   Total Bilirubin  0.2 (*) 0.3 - 1.2 mg/dL   GFR calc non Af Amer >90  >90 mL/min   GFR calc Af Amer >90  >90 mL/min   Anion gap 11  5 - 15  URINALYSIS, ROUTINE W REFLEX MICROSCOPIC      Result Value Ref Range   Color, Urine YELLOW  YELLOW   APPearance CLEAR  CLEAR   Specific Gravity, Urine 1.025  1.005 - 1.030   pH 6.0  5.0 - 8.0   Glucose, UA NEGATIVE  NEGATIVE mg/dL   Hgb urine dipstick NEGATIVE  NEGATIVE   Bilirubin Urine NEGATIVE  NEGATIVE   Ketones, ur NEGATIVE  NEGATIVE mg/dL   Protein, ur NEGATIVE  NEGATIVE mg/dL   Urobilinogen, UA 0.2  0.0 - 1.0 mg/dL   Nitrite NEGATIVE  NEGATIVE   Leukocytes, UA NEGATIVE  NEGATIVE    MDM   Final diagnoses:  None    Though the pt has no fever or tachycardia, he does have ttp that is concerning for intraabdominal process.  He is passing urine spontaneously and the UA is normal.   Normal WBC and normal CMP.  He will need CT scan since labs show no definitive source.  Pain meds given, CT ordered.  The pt is morbidly obese - he may need transport to [facility] for CT if he is too large for scanner.    Change of shift - care signed out to dr. Fonnie Jarvis.  Filed Vitals:   02/09/14 2154  BP: 157/90  Pulse: 81  Temp: 98.3 F (36.8 C)  TempSrc: Oral  Resp: 18  Height: 5\' 7"  (1.702 m)  Weight: 420 lb (190.511 kg)  SpO2: 99%    I personally performed the services described in this documentation, which was scribed in my presence. The recorded information has been reviewed and is accurate.       Steven Roller, MD 02/09/14 [id number]

## 2014-02-10 ENCOUNTER — Observation Stay (HOSPITAL_COMMUNITY): Payer: Medicaid Other

## 2014-02-10 ENCOUNTER — Encounter (HOSPITAL_COMMUNITY): Payer: Self-pay

## 2014-02-10 DIAGNOSIS — Z8711 Personal history of peptic ulcer disease: Secondary | ICD-10-CM | POA: Diagnosis not present

## 2014-02-10 DIAGNOSIS — R1011 Right upper quadrant pain: Secondary | ICD-10-CM

## 2014-02-10 DIAGNOSIS — K8020 Calculus of gallbladder without cholecystitis without obstruction: Principal | ICD-10-CM

## 2014-02-10 DIAGNOSIS — Z6841 Body Mass Index (BMI) 40.0 and over, adult: Secondary | ICD-10-CM | POA: Diagnosis not present

## 2014-02-10 DIAGNOSIS — F17210 Nicotine dependence, cigarettes, uncomplicated: Secondary | ICD-10-CM | POA: Diagnosis present

## 2014-02-10 DIAGNOSIS — Z8249 Family history of ischemic heart disease and other diseases of the circulatory system: Secondary | ICD-10-CM | POA: Diagnosis not present

## 2014-02-10 LAB — CREATININE, SERUM
Creatinine, Ser: 0.67 mg/dL (ref 0.50–1.35)
GFR calc non Af Amer: >90 mL/min (ref >90)

## 2014-02-10 LAB — CBC
HEMATOCRIT: 38.1 % — AB (ref 39.0–52.0)
HEMOGLOBIN: 12.5 g/dL — AB (ref 13.0–17.0)
MCH: 28.3 pg (ref 26.0–34.0)
MCHC: 32.8 g/dL (ref 30.0–36.0)
MCV: 86.2 fL (ref 78.0–100.0)
PLATELETS: 241 10*3/uL (ref 150–400)
RBC: 4.42 MIL/uL (ref 4.22–5.81)
RDW: 13.8 % (ref 11.5–15.5)
WBC: 8.7 10*3/uL (ref 4.0–10.5)

## 2014-02-10 LAB — LIPASE, BLOOD: Lipase: 25 U/L (ref 11–59)

## 2014-02-10 MED ORDER — SODIUM CHLORIDE 0.9 % IV SOLN
INTRAVENOUS | Status: DC
  Administered 2014-02-10 – 2014-02-11 (×2): via INTRAVENOUS

## 2014-02-10 MED ORDER — METOCLOPRAMIDE HCL 5 MG/ML IJ SOLN
10.0000 mg | Freq: Once | INTRAMUSCULAR | Status: AC
  Administered 2014-02-10: 10 mg via INTRAVENOUS
  Filled 2014-02-10: qty 2

## 2014-02-10 MED ORDER — HYDROMORPHONE HCL 1 MG/ML IJ SOLN
2.0000 mg | INTRAMUSCULAR | Status: DC | PRN
  Administered 2014-02-10 – 2014-02-11 (×5): 2 mg via INTRAVENOUS
  Filled 2014-02-10 (×5): qty 2

## 2014-02-10 MED ORDER — HYDROMORPHONE HCL 1 MG/ML IJ SOLN
1.0000 mg | INTRAMUSCULAR | Status: AC | PRN
  Administered 2014-02-10: 1 mg via INTRAVENOUS
  Filled 2014-02-10: qty 1

## 2014-02-10 MED ORDER — SODIUM CHLORIDE 0.9 % IJ SOLN
INTRAMUSCULAR | Status: AC
  Filled 2014-02-10: qty 18

## 2014-02-10 MED ORDER — MORPHINE SULFATE 4 MG/ML IJ SOLN
8.0000 mg | Freq: Once | INTRAMUSCULAR | Status: AC
  Administered 2014-02-10: 8 mg via INTRAVENOUS
  Filled 2014-02-10: qty 2

## 2014-02-10 MED ORDER — STERILE WATER FOR INJECTION IJ SOLN
INTRAMUSCULAR | Status: AC
  Administered 2014-02-10: 3.81 mL via INTRAVENOUS
  Filled 2014-02-10: qty 10

## 2014-02-10 MED ORDER — ACETAMINOPHEN 650 MG RE SUPP: 650.0000 mg | Freq: Four times a day (QID) | RECTAL | Status: DC | PRN

## 2014-02-10 MED ORDER — TECHNETIUM TC 99M MEBROFENIN IV KIT
5.0000 | PACK | Freq: Once | INTRAVENOUS | Status: AC | PRN
  Administered 2014-02-10: 5 via INTRAVENOUS

## 2014-02-10 MED ORDER — ONDANSETRON HCL 4 MG/2ML IJ SOLN: 4.0000 mg | Freq: Three times a day (TID) | INTRAMUSCULAR | Status: AC | PRN

## 2014-02-10 MED ORDER — ONDANSETRON HCL 4 MG/2ML IJ SOLN
4.0000 mg | Freq: Four times a day (QID) | INTRAMUSCULAR | Status: DC | PRN
  Administered 2014-02-10 – 2014-02-11 (×2): 4 mg via INTRAVENOUS
  Filled 2014-02-10 (×2): qty 2

## 2014-02-10 MED ORDER — DEXTROSE-NACL 5-0.45 % IV SOLN
INTRAVENOUS | Status: AC
  Administered 2014-02-10: 06:00:00 via INTRAVENOUS

## 2014-02-10 MED ORDER — ACETAMINOPHEN 325 MG PO TABS: 650.0000 mg | ORAL_TABLET | Freq: Four times a day (QID) | ORAL | Status: DC | PRN

## 2014-02-10 MED ORDER — DIPHENHYDRAMINE HCL 25 MG PO CAPS
25.0000 mg | ORAL_CAPSULE | Freq: Four times a day (QID) | ORAL | Status: DC | PRN
  Administered 2014-02-10 – 2014-02-11 (×3): 25 mg via ORAL
  Filled 2014-02-10 (×3): qty 1

## 2014-02-10 MED ORDER — ENOXAPARIN SODIUM 80 MG/0.8ML ~~LOC~~ SOLN
80.0000 mg | SUBCUTANEOUS | Status: DC
  Administered 2014-02-10 – 2014-02-11 (×2): 80 mg via SUBCUTANEOUS
  Filled 2014-02-10 (×2): qty 0.8

## 2014-02-10 MED ORDER — MORPHINE SULFATE 4 MG/ML IJ SOLN
4.0000 mg | Freq: Once | INTRAMUSCULAR | Status: AC
Start: 2014-02-10 — End: 2014-02-10
  Administered 2014-02-10: 4 mg via INTRAVENOUS
  Filled 2014-02-10: qty 1

## 2014-02-10 MED ORDER — SINCALIDE 5 MCG IJ SOLR
INTRAMUSCULAR | Status: AC
  Administered 2014-02-10: 3.81 ug via INTRAVENOUS
  Filled 2014-02-10: qty 5

## 2014-02-10 MED ORDER — ENOXAPARIN SODIUM 40 MG/0.4ML ~~LOC~~ SOLN: 40.0000 mg | SUBCUTANEOUS | Status: DC

## 2014-02-10 MED ORDER — SODIUM CHLORIDE 0.9 % IV BOLUS (SEPSIS)
1000.0000 mL | Freq: Once | INTRAVENOUS | Status: AC
  Administered 2014-02-10: 1000 mL via INTRAVENOUS

## 2014-02-10 MED ORDER — FAMOTIDINE IN NACL 20-0.9 MG/50ML-% IV SOLN
20.0000 mg | Freq: Once | INTRAVENOUS | Status: AC
  Administered 2014-02-10: 20 mg via INTRAVENOUS
  Filled 2014-02-10: qty 50

## 2014-02-10 MED ORDER — ONDANSETRON HCL 4 MG PO TABS
4.0000 mg | ORAL_TABLET | Freq: Four times a day (QID) | ORAL | Status: DC | PRN
  Administered 2014-02-10 – 2014-02-11 (×2): 4 mg via ORAL
  Filled 2014-02-10 (×2): qty 1

## 2014-02-10 NOTE — Progress Notes (Signed)
Pt states he feels like he needs to have a bowel movement but pt has been unable to pass stool so far today.  Will continue to monitor.

## 2014-02-10 NOTE — Progress Notes (Signed)
Notified by radiology not to give narcotics to pt as he has to be clear for 6 hours pre-gall bladder test.  Pt notified and agreed.

## 2014-02-10 NOTE — Care Management Note (Signed)
    Page 1 of 1   02/11/2014     2:13:31 PM CARE MANAGEMENT NOTE 02/11/2014  Patient:  [name],[name]   Account Number:  [id number]  Date Initiated:  02/10/2014  Documentation initiated by:  Sharrie RothmanBLACKWELL,Lennie Vasco C  Subjective/Objective Assessment:   Pt admitted from home with abd pain. Pt lives alone while his mother is in the nursing home. Pt stated that he is doing well living alone. Pt has a cane and walker for home use.     Action/Plan:   Will continue to follow for discharge planning needs.   Anticipated DC Date:  02/11/2014   Anticipated DC Plan:  HOME/SELF CARE      DC Planning Services  CM consult      Choice offered to / List presented to:             Status of service:  Completed, signed off Medicare Important Message given?   (If response is "NO", the following Medicare IM given date fields will be blank) Date Medicare IM given:   Medicare IM given by:   Date Additional Medicare IM given:   Additional Medicare IM given by:    Discharge Disposition:  HOME/SELF CARE  Per UR Regulation:    If discussed at Long Length of Stay Meetings, dates discussed:    Comments:  02/11/14 1410 Arlyss Queenammy Liany Mumpower, RN BSN CM Pt discharged home today. No CM needs noted. Pt was asking for a w/c but has now decided that he does not want it.  02/10/14 1415 Arlyss Queenammy Graden Hoshino, RN BSN CM

## 2014-02-10 NOTE — Progress Notes (Signed)
Ultrasound nondiagnostic given body habitus.  HIDA shows low ejection fraction, but no reportable symptoms with CCK injection. Patient states he did have some pain with the CCK injection. He currently denies any nausea. He has been worked up for diarrhea. Patient's primary presentation with right-sided abdominal pain is somewhat unique as he has not had any nausea or vomiting. Liver tests are within normal limits. Lipase is within normal limits. He has had some diarrhea and this is being worked up. Will wait for workup of diarrhea prior to proceeding with possible cholecystectomy. Patient is a high risk surgical candidate given his body habitus. Will advance to heart healthy diet.

## 2014-02-10 NOTE — ED Provider Notes (Signed)
48 hours right upper quadrant pain was temporarily relieved in the emergency department but is now recurrent with moderate tenderness right upper quadrant without rebound but does have nausea again without vomiting and has a history of 4-5 days of up to 5 loose stools per day. CT scan reveals cholelithiasis without evidence of cholecystitis as well as a normal appendix and patient has minimal if any right lower quadrant tenderness on repeat examination. Due to recurrent pain repeat morphine ordered and lipase added to labs. 0040 Pt with unchanged pain so Surg paged to consider Obs. D/w Lovell SheehanJenkins Surg recs Med Obs and [facility] can consult Surg in AM prn. 0340 D/w [facility] for Obs. 0350  Hurman HornJohn M Aleila Syverson, MD 02/15/14 [phone]

## 2014-02-10 NOTE — H&P (Addendum)
PCP:   No primary provider on file.   Chief Complaint:  Abdominal pain  HPI:  28 year old male who  has a past medical history of Gout; Cellulitis; Obesity; Erosive esophagitis (09/01/2012); Gastric ulcer with hemorrhage (09/02/2012); Acute blood loss anemia (09/02/2012); and UTI (lower urinary tract infection). Today came to the ED with chief complaint of right upper quadrant pain. The pain started yesterday, he also has been having diarrhea over the past  5 days. Patient says that he has been moving bowels 5 times a day. He admits to having one episode of bloody stools. He also had nausea but no vomiting. Patient has history of peptic ulcer disease due to NSAID use in the past and underwent endoscopy. The pain is in the right upper quadrant, 9 / 10 in intensity, CT scan of the abdomen and pelvis was done which showed cholelithiasis. Patient denies fever, no chills no chest pain or shortness of breath.  Allergies:   Allergies  Allergen Reactions  . Asa [Aspirin] Other (See Comments)    Pt doesn't know reaction.  . Nsaids Other (See Comments)    Causes Ulcers   . Penicillins Other (See Comments)    Pt doesn't know reaction.  . Sulfa Antibiotics Other (See Comments)    Pt doesn't know reaction.      Past Medical History  Diagnosis Date  . Gout   . Cellulitis   . Obesity   . Erosive esophagitis 09/01/2012    NSAID induced.  . Gastric ulcer with hemorrhage 09/02/2012    s/p bleeding control tx. Per Dr. Jena Gaussourk  . Acute blood loss anemia 09/02/2012    S/p 1 unit rbcs  . UTI (lower urinary tract infection)     Past Surgical History  Procedure Laterality Date  . Tonsillectomy    . Esophagogastroduodenoscopy Left 09/01/2012    GNF:[id number]:Distal esophageal erosions consistent with erosive reflux esophagitis/Pre-pyloric benign-appearing gastric ulcer with bleeding stigmata status post bleeding control therapy as described above  . Esophagogastroduodenoscopy N/A 12/13/2012    Procedure:  ESOPHAGOGASTRODUODENOSCOPY (EGD);  Surgeon: Corbin Adeobert M Rourk, MD;  Location: [facility];  Service: Endoscopy;  Laterality: N/A;  11:30    Prior to Admission medications   Medication Sig Start Date End Date Taking? Authorizing Provider  colchicine 0.6 MG tablet Take 1 tablet (0.6 mg total) by mouth daily. 01/08/14  Yes Flint MelterElliott L Wentz, MD  traMADol (ULTRAM) 50 MG tablet Take 1 tablet (50 mg total) by mouth every 6 (six) hours as needed. 01/22/14  Yes Kristen N Ward, DO    Social History:  reports that he has been smoking Cigarettes.  He has a 1.75 pack-year smoking history. His smokeless tobacco use includes Snuff. He reports that he does not drink alcohol or use illicit drugs.  Family History  Problem Relation Age of Onset  . Heart failure Mother      All the positives are listed in BOLD  Review of Systems:  HEENT: Headache, blurred vision, runny nose, sore throat Neck: Hypothyroidism, hyperthyroidism,,lymphadenopathy Chest : Shortness of breath, history of COPD, Asthma Heart : Chest pain, history of coronary arterey disease GI:  Nausea, vomiting, diarrhea, constipation, GERD GU: Dysuria, urgency, frequency of urination, hematuria Neuro: Stroke, seizures, syncope Psych: Depression, anxiety, hallucinations   Physical Exam: Blood pressure 123/92, pulse 73, temperature 98.3 F (36.8 C), temperature source Oral, resp. rate 20, height 5\' 7"  (1.702 m), weight 190.511 kg (420 lb), SpO2 98.00%. Constitutional:   Patient is a well-developed and well-nourished morbidly  obese male* in no acute distress and cooperative with exam. Head: Normocephalic and atraumatic Mouth: Mucus membranes moist Eyes: PERRL, EOMI, conjunctivae normal Neck: Supple, No Thyromegaly Cardiovascular: RRR, S1 normal, S2 normal Pulmonary/Chest: CTAB, no wheezes, rales, or rhonchi Abdominal: Soft. Positive right upper quadrant tenderness, non-distended, bowel sounds are normal, no masses, organomegaly, or guarding  present.  Neurological: A&O x3, Strenght is normal and symmetric bilaterally, cranial nerve II-XII are grossly intact, no focal motor deficit, sensory intact to light touch bilaterally.  Extremities : No Cyanosis, Clubbing or Edema  Labs on Admission:  Basic Metabolic Panel:  Recent Labs Lab 02/09/14 2225  NA 140  K 3.7  CL 102  CO2 27  GLUCOSE 98  BUN 9  CREATININE 0.69  CALCIUM 9.2   Liver Function Tests:  Recent Labs Lab 02/09/14 2225  AST 13  ALT 19  ALKPHOS 74  BILITOT 0.2*  PROT 7.0  ALBUMIN 3.4*    Recent Labs Lab 02/10/14 0027  LIPASE 25   No results found for this basename: AMMONIA,  in the last 168 hours CBC:  Recent Labs Lab 02/09/14 2225  WBC 9.0  NEUTROABS 5.0  HGB 13.5  HCT 40.2  MCV 85.0  PLT 240   Cardiac Enzymes: No results found for this basename: CKTOTAL, CKMB, CKMBINDEX, TROPONINI,  in the last 168 hours  BNP (last 3 results) No results found for this basename: PROBNP,  in the last 8760 hours CBG: No results found for this basename: GLUCAP,  in the last 168 hours  Radiological Exams on Admission: Ct Abdomen Pelvis W Contrast  02/09/2014   CLINICAL DATA:  Right-sided abdominal pain for 1 day. Initial encounter  EXAM: CT ABDOMEN AND PELVIS WITH CONTRAST  TECHNIQUE: Multidetector CT imaging of the abdomen and pelvis was performed using the standard protocol following bolus administration of intravenous contrast.  CONTRAST:  OMNIPAQUE IOHEXOL 300 MG/ML SOLN, 25mL OMNIPAQUE IOHEXOL 300 MG/ML SOLN  COMPARISON:  01/28/2013  FINDINGS: BODY WALL: Chronic enlargement of bilateral inguinal lymph nodes.  LOWER CHEST: Stable, benign sub centimeter pulmonary nodules in the subpleural left lower lobe and right lower lobe.  ABDOMEN/PELVIS:  Liver: Diffuse fatty infiltration of the liver.  Biliary: Cholelithiasis.  No evidence of acute cholecystitis.  Pancreas: Unremarkable.  Spleen: Unremarkable.  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 Given the marked low-density for a contrast-enhanced CT, this could also represent a myelolipoma with macroscopic fat not clearly resolved due to patient size.  Kidneys and ureters: No hydronephrosis or stone.  Bladder: Unremarkable.  Reproductive: Unremarkable.  Bowel: No obstruction. Normal appendix.  Retroperitoneum: No mass or adenopathy.  Peritoneum: No ascites or pneumoperitoneum.  Vascular: No acute abnormality.  OSSEOUS: No acute abnormalities.  IMPRESSION: 1. No acute intra-abdominal findings. 2. Cholelithiasis and other chronic findings are noted above.   Electronically Signed   By: Tiburcio Pea M.D.   On: 02/09/2014 23:54       Assessment/Plan Active Problems:   Right upper quadrant abdominal pain   Cholelithiasis  Right upper quadrant abdominal pain Likely due to the gallstones, patient does not have any signs of inflammation of the gallbladder. Liver enzymes are normal, lipase 25 .He has been afebrile, normal white count. Will give Dilaudid 2 mg IV every 4 hours when necessary. Will obtain abdominal ultrasound in a.m.  Cholelithiasis The CT scan abdomen pelvis showed cholelithiasis and no other acute abdominal findings. Surgery has been  consulted by ED physician, Dr. Lovell SheehanJenkins to see the patient in morning Will continue with IV fluids.  DVT prophylaxis Lovenox   Code status: Patient is full code  Family discussion: No family at bedside   Time Spent on Admission: 55 minutes  Reham Slabaugh S [facility] Pager: [phone] 02/10/2014, 4:24 AM  If 7PM-7AM, please contact night-coverage  [email or web address]  Password [email or web address]

## 2014-02-10 NOTE — Progress Notes (Signed)
Patient admitted by Dr. Sharl MaLama earlier this morning  Patient seen and examined.  Patient was admitted to the hospital with right-sided abdominal pain and diarrhea. He also had some nausea and questionable vomiting. LFTs were noted to be normal range and lipase was found to be normal. Urinalysis is unremarkable. CT scan of the abdomen pelvis did show cholelithiasis without any acute other findings. HIDA scan but a patent biliary tree, but abnormal gallbladder ejection fraction of 6% was injected with CCK. Per radiology staff, patient did not have any increased pain with CCK administration, although the patient does say that he did have pain. He is a difficult historian. Certainly, with his morbid obesity he would be a higher risk surgical candidate. Plans are to advance diet. Case discussed with Dr. Lovell SheehanJenkins who is following the patient. He was in his room today after having dinner and did not report any vomiting. He continues to have right-sided abdominal pain. He reports several loose bowel movements a day, although no stool has been collected by staff thus far. GI pathogen panel currently needs to be collected. We'll continue with supportive treatment for now.  MEMON,JEHANZEB

## 2014-02-10 NOTE — Progress Notes (Signed)
Dr Kerry HoughMemon notified pt complaining of itching all over body due to dry skin/psoriasis.  Pt requesting anti-itching medication.  Will continue to monitor.

## 2014-02-10 NOTE — Consult Note (Signed)
Reason for Consult: Right upper quadrant abdominal pain Referring Physician: Hospitalist  DYLON CORREA is an 28 y.o. male.  HPI: Patient is a 28 year old morbidly obese white male with a history of peptic ulcer disease and erosive esophagitis who presents with a several-day history of right upper quadrant abdominal pain. He also states he has been having diarrhea. He thought he passed some blood several days ago, but has not passed blood since then. He denies any NSAID use. He states the pain is constant. It is not associated with food. He has not had emesis.  Past Medical History  Diagnosis Date  . Gout   . Cellulitis   . Obesity   . Erosive esophagitis 09/01/2012    NSAID induced.  . Gastric ulcer with hemorrhage 09/02/2012    s/p bleeding control tx. Per Dr. Gala Romney  . Acute blood loss anemia 09/02/2012    S/p 1 unit rbcs  . UTI (lower urinary tract infection)     Past Surgical History  Procedure Laterality Date  . Tonsillectomy    . Esophagogastroduodenoscopy Left 09/01/2012    IRJ:JOACZY esophageal erosions consistent with erosive reflux esophagitis/Pre-pyloric benign-appearing gastric ulcer with bleeding stigmata status post bleeding control therapy as described above  . Esophagogastroduodenoscopy N/A 12/13/2012    Procedure: ESOPHAGOGASTRODUODENOSCOPY (EGD);  Surgeon: Daneil Dolin, MD;  Location: [facility];  Service: Endoscopy;  Laterality: N/A;  11:30    Family History  Problem Relation Age of Onset  . Heart failure Mother     Social History:  reports that he has been smoking Cigarettes.  He has a 1.75 pack-year smoking history. His smokeless tobacco use includes Snuff. He reports that he does not drink alcohol or use illicit drugs.  Allergies:  Allergies  Allergen Reactions  . Asa [Aspirin] Other (See Comments)    Pt doesn't know reaction.  . Nsaids Other (See Comments)    Causes Ulcers   . Penicillins Other (See Comments)    Pt doesn't know reaction.  .  Sulfa Antibiotics Other (See Comments)    Pt doesn't know reaction.    Medications: I have reviewed the patient's current medications.  Results for orders placed during the hospital encounter of 02/09/14 (from the past 48 hour(s))  URINALYSIS, ROUTINE W REFLEX MICROSCOPIC     Status: None   Collection Time    02/09/14 10:23 PM      Result Value Ref Range   Color, Urine YELLOW  YELLOW   APPearance CLEAR  CLEAR   Specific Gravity, Urine 1.025  1.005 - 1.030   pH 6.0  5.0 - 8.0   Glucose, UA NEGATIVE  NEGATIVE mg/dL   Hgb urine dipstick NEGATIVE  NEGATIVE   Bilirubin Urine NEGATIVE  NEGATIVE   Ketones, ur NEGATIVE  NEGATIVE mg/dL   Protein, ur NEGATIVE  NEGATIVE mg/dL   Urobilinogen, UA 0.2  0.0 - 1.0 mg/dL   Nitrite NEGATIVE  NEGATIVE   Leukocytes, UA NEGATIVE  NEGATIVE   Comment: MICROSCOPIC NOT DONE ON URINES WITH NEGATIVE PROTEIN, BLOOD, LEUKOCYTES, NITRITE, OR GLUCOSE <1000 mg/dL.  CBC WITH DIFFERENTIAL     Status: Abnormal   Collection Time    02/09/14 10:25 PM      Result Value Ref Range   WBC 9.0  4.0 - 10.5 K/uL   RBC 4.73  4.22 - 5.81 MIL/uL   Hemoglobin 13.5  13.0 - 17.0 g/dL   HCT 40.2  39.0 - 52.0 %   MCV 85.0  78.0 - 100.0  fL   MCH 28.5  26.0 - 34.0 pg   MCHC 33.6  30.0 - 36.0 g/dL   RDW 13.6  11.5 - 15.5 %   Platelets 240  150 - 400 K/uL   Neutrophils Relative % 56  43 - 77 %   Neutro Abs 5.0  1.7 - 7.7 K/uL   Lymphocytes Relative 30  12 - 46 %   Lymphs Abs 2.7  0.7 - 4.0 K/uL   Monocytes Relative 8  3 - 12 %   Monocytes Absolute 0.7  0.1 - 1.0 K/uL   Eosinophils Relative 6 (*) 0 - 5 %   Eosinophils Absolute 0.5  0.0 - 0.7 K/uL   Basophils Relative 0  0 - 1 %   Basophils Absolute 0.0  0.0 - 0.1 K/uL  COMPREHENSIVE METABOLIC PANEL     Status: Abnormal   Collection Time    02/09/14 10:25 PM      Result Value Ref Range   Sodium 140  137 - 147 mEq/L   Potassium 3.7  3.7 - 5.3 mEq/L   Chloride 102  96 - 112 mEq/L   CO2 27  19 - 32 mEq/L   Glucose, Bld 98   70 - 99 mg/dL   BUN 9  6 - 23 mg/dL   Creatinine, Ser 0.69  0.50 - 1.35 mg/dL   Calcium 9.2  8.4 - 10.5 mg/dL   Total Protein 7.0  6.0 - 8.3 g/dL   Albumin 3.4 (*) 3.5 - 5.2 g/dL   AST 13  0 - 37 U/L   ALT 19  0 - 53 U/L   Alkaline Phosphatase 74  39 - 117 U/L   Total Bilirubin 0.2 (*) 0.3 - 1.2 mg/dL   GFR calc non Af Amer >90  >90 mL/min   GFR calc Af Amer >90  >90 mL/min   Comment: (NOTE)     The eGFR has been calculated using the CKD EPI equation.     This calculation has not been validated in all clinical situations.     eGFR's persistently <90 mL/min signify possible Chronic Kidney     Disease.   Anion gap 11  5 - 15  LIPASE, BLOOD     Status: None   Collection Time    02/10/14 12:27 AM      Result Value Ref Range   Lipase 25  11 - 59 U/L  CBC     Status: Abnormal   Collection Time    02/10/14  5:51 AM      Result Value Ref Range   WBC 8.7  4.0 - 10.5 K/uL   RBC 4.42  4.22 - 5.81 MIL/uL   Hemoglobin 12.5 (*) 13.0 - 17.0 g/dL   HCT 38.1 (*) 39.0 - 52.0 %   MCV 86.2  78.0 - 100.0 fL   MCH 28.3  26.0 - 34.0 pg   MCHC 32.8  30.0 - 36.0 g/dL   RDW 13.8  11.5 - 15.5 %   Platelets 241  150 - 400 K/uL  CREATININE, SERUM     Status: None   Collection Time    02/10/14  5:51 AM      Result Value Ref Range   Creatinine, Ser 0.67  0.50 - 1.35 mg/dL   GFR calc non Af Amer >90  >90 mL/min   GFR calc Af Amer >90  >90 mL/min   Comment: (NOTE)     The eGFR has been calculated using the CKD  EPI equation.     This calculation has not been validated in all clinical situations.     eGFR's persistently <90 mL/min signify possible Chronic Kidney     Disease.    Ct Abdomen Pelvis W Contrast  02/09/2014   CLINICAL DATA:  Right-sided abdominal pain for 1 day. Initial encounter  EXAM: CT ABDOMEN AND PELVIS WITH CONTRAST  TECHNIQUE: Multidetector CT imaging of the abdomen and pelvis was performed using the standard protocol following bolus administration of intravenous contrast.   CONTRAST:  142m OMNIPAQUE IOHEXOL 300 MG/ML SOLN, 225mOMNIPAQUE IOHEXOL 300 MG/ML SOLN  COMPARISON:  01/28/2013  FINDINGS: BODY WALL: Chronic enlargement of bilateral inguinal lymph nodes.  LOWER CHEST: Stable, benign sub centimeter pulmonary nodules in the subpleural left lower lobe and right lower lobe.  ABDOMEN/PELVIS:  Liver: Diffuse fatty infiltration of the liver.  Biliary: Cholelithiasis.  No evidence of acute cholecystitis.  Pancreas: Unremarkable.  Spleen: Unremarkable.  Adrenals: Stable 2 cm low-density nodules in the left adrenal gland which was previously reported as a probable adenoma. Given the marked low-density for a contrast-enhanced CT, this could also represent a myelolipoma with macroscopic fat not clearly resolved due to patient size.  Kidneys and ureters: No hydronephrosis or stone.  Bladder: Unremarkable.  Reproductive: Unremarkable.  Bowel: No obstruction. Normal appendix.  Retroperitoneum: No mass or adenopathy.  Peritoneum: No ascites or pneumoperitoneum.  Vascular: No acute abnormality.  OSSEOUS: No acute abnormalities.  IMPRESSION: 1. No acute intra-abdominal findings. 2. Cholelithiasis and other chronic findings are noted above.   Electronically Signed   By: JoJorje Guild.D.   On: 02/09/2014 23:54    ROS: See chart Blood pressure 113/71, pulse 60, temperature 97.5 F (36.4 C), temperature source Oral, resp. rate 20, height _0  (1.702 m), weight 190.511 kg (420 lb), SpO2 93.00%. Physical Exam: Morbidly obese white male who was asleep when I came into the room. As soon as I woke him up, he stated he was started having abdominal pain. Abdomen is soft with tenderness to light palpation in the right upper quadrant. No rigidity is noted. Examination limited secondary to body habitus.  Assessment/Plan: Impression: Upper abdominal pain with a known history of cholelithiasis, but normal liver enzyme tests. He also has a history of peptic ulcer disease. Plan: We'll get HIDA scan  today to assess gallbladder. Further management pending those results. May need GI consultation if negative.  Darnell Jeschke A 02/10/2014, 8:28 AM

## 2014-02-11 LAB — COMPREHENSIVE METABOLIC PANEL
ALK PHOS: 73 U/L (ref 39–117)
ALT: 22 U/L (ref 0–53)
ANION GAP: 10 (ref 5–15)
AST: 16 U/L (ref 0–37)
Albumin: 3.1 g/dL — ABNORMAL LOW (ref 3.5–5.2)
BILIRUBIN TOTAL: 0.3 mg/dL (ref 0.3–1.2)
BUN: 8 mg/dL (ref 6–23)
CHLORIDE: 103 meq/L (ref 96–112)
CO2: 28 meq/L (ref 19–32)
CREATININE: 0.69 mg/dL (ref 0.50–1.35)
Calcium: 8.6 mg/dL (ref 8.4–10.5)
GFR calc Af Amer: >90 mL/min (ref >90)
Glucose, Bld: 97 mg/dL (ref 70–99)
POTASSIUM: 4 meq/L (ref 3.7–5.3)
Sodium: 141 mEq/L (ref 137–147)
Total Protein: 6.5 g/dL (ref 6.0–8.3)

## 2014-02-11 LAB — CBC
HEMATOCRIT: 39 % (ref 39.0–52.0)
HEMOGLOBIN: 12.5 g/dL — AB (ref 13.0–17.0)
MCH: 28 pg (ref 26.0–34.0)
MCHC: 32.1 g/dL (ref 30.0–36.0)
MCV: 87.4 fL (ref 78.0–100.0)
Platelets: 258 10*3/uL (ref 150–400)
RBC: 4.46 MIL/uL (ref 4.22–5.81)
RDW: 13.8 % (ref 11.5–15.5)
WBC: 7.3 10*3/uL (ref 4.0–10.5)

## 2014-02-11 LAB — GI PATHOGEN PANEL BY PCR, STOOL
C DIFFICILE TOXIN A/B: NEGATIVE
CAMPYLOBACTER BY PCR: NEGATIVE
Cryptosporidium by PCR: NEGATIVE
E COLI (ETEC) LT/ST: NEGATIVE
E COLI 0157 BY PCR: NEGATIVE
E coli (STEC): NEGATIVE
G lamblia by PCR: NEGATIVE
Norovirus GI/GII: NEGATIVE
ROTAVIRUS A BY PCR: NEGATIVE
SALMONELLA BY PCR: NEGATIVE
Shigella by PCR: NEGATIVE

## 2014-02-11 MED ORDER — PANTOPRAZOLE SODIUM 40 MG PO TBEC: 40.0000 mg | DELAYED_RELEASE_TABLET | Freq: Every day | ORAL | Status: DC

## 2014-02-11 NOTE — Progress Notes (Signed)
UR chart review completed.  

## 2014-02-11 NOTE — Discharge Summary (Signed)
Physician Discharge Summary  Steven CoinsMichael E Atkins ZOX:[id number] DOB: 1986-04-01 DOA: 02/09/2014  PCP: No primary provider on file.  Admit date: 02/09/2014 Discharge date: 02/11/2014  Time spent: 22 minutes  Recommendations for Outpatient Follow-up:  1. Follow up with Gen surgery 2. Needs OP motivation for weight loss, chronic care  Discharge Diagnoses:  Active Problems:   Right upper quadrant abdominal pain   Cholelithiasis   Discharge Condition: fair  Diet recommendation:  Heart healthy  Filed Weights   02/09/14 2154 02/10/14 0659  Weight: 190.511 kg (420 lb) 190.511 kg (420 lb)    History of present illness:  28 y/o ? h/o gout, cellulitis, Morbid obesity, Body mass index is 65.77 kg/(m^2)., erosive esophagitis and gastric ulcer with hemorrhage admitted 02/10/14 with abd pain Ct scan showed cholelithiasis, HIDA scan showed depressed GB EF 6%.  lft's wnl on admit.  Gen surgery saw patient and did not feel patient had acute cholecystitis and felt like this could be followed as an OP. I explained this to patient.  I also mentioned his need for surgery may be there evntually but right now it would be a high risk elective procedure.  It certainly isn't an emergent procedure I explained to patient I could also prescribe some of his chronic tramadol, but he decline both this and a mild NSAID stating that tramadol doesn't;t work and that he cannot take NSAIDs [3 month out from his esophagitis] I have encouraged him to follow with Dr. Lovell Atkins and if he has any further issues to report to his PMD  Consultations:  Gen surgery  Discharge Exam: Filed Vitals:   02/11/14 0652  BP: 129/69  Pulse: 74  Temp: 97.4 F (36.3 C)  Resp: 20    General: alert pleasant oriented Cardiovascular: s1 s 2no m/r/g Respiratory: clear  Discharge Instructions You were cared for by a hospitalist during your hospital stay. If you have any questions about your discharge medications or the care you  received while you were in the hospital after you are discharged, you can call the unit and asked to speak with the hospitalist on call if the hospitalist that took care of you is not available. Once you are discharged, your primary care physician will handle any further medical issues. Please note that NO REFILLS for any discharge medications will be authorized once you are discharged, as it is imperative that you return to your primary care physician (or establish a relationship with a primary care physician if you do not have one) for your aftercare needs so that they can reassess your need for medications and monitor your lab values.  Discharge Instructions   Diet - low sodium heart healthy    Complete by:  As directed      Discharge instructions    Complete by:  As directed   Follow with Gen surgeon Dr. Lovell Atkins as an OP Please follow-up with Dr. Loyola Atkins for primary care issues     Increase activity slowly    Complete by:  As directed           Current Discharge Medication List    START taking these medications   Details  pantoprazole (PROTONIX) 40 MG tablet Take 1 tablet (40 mg total) by mouth daily. Qty: 30 tablet, Refills: 0      CONTINUE these medications which have NOT CHANGED   Details  colchicine 0.6 MG tablet Take 1 tablet (0.6 mg total) by mouth daily. Qty: 90 tablet, Refills: 0    traMADol (ULTRAM)  50 MG tablet Take 1 tablet (50 mg total) by mouth every 6 (six) hours as needed. Qty: 15 tablet, Refills: 0       Allergies  Allergen Reactions  . Asa [Aspirin] Other (See Comments)    Pt doesn't know reaction.  . Nsaids Other (See Comments)    Causes Ulcers   . Penicillins Other (See Comments)    Pt doesn't know reaction.  . Sulfa Antibiotics Other (See Comments)    Pt doesn't know reaction.      The results of significant diagnostics from this hospitalization (including imaging, microbiology, ancillary and laboratory) are listed below for reference.     Significant Diagnostic Studies: Ct Abdomen Pelvis W Contrast  02/09/2014   CLINICAL DATA:  Right-sided abdominal pain for 1 day. Initial encounter  EXAM: CT ABDOMEN AND PELVIS WITH CONTRAST  TECHNIQUE: Multidetector CT imaging of the abdomen and pelvis was performed using the standard protocol following bolus administration of intravenous contrast.  CONTRAST:  100mL OMNIPAQUE IOHEXOL 300 MG/ML SOLN, 25mL OMNIPAQUE IOHEXOL 300 MG/ML SOLN  COMPARISON:  01/28/2013  FINDINGS: BODY WALL: Chronic enlargement of bilateral inguinal lymph nodes.  LOWER CHEST: Stable, benign sub centimeter pulmonary nodules in the subpleural left lower lobe and right lower lobe.  ABDOMEN/PELVIS:  Liver: Diffuse fatty infiltration of the liver.  Biliary: Cholelithiasis.  No evidence of acute cholecystitis.  Pancreas: Unremarkable.  Spleen: Unremarkable.  Adrenals: Stable 2 cm low-density nodules in the left adrenal gland which was previously reported as a probable adenoma. Given the marked low-density for a contrast-enhanced CT, this could also represent a myelolipoma with macroscopic fat not clearly resolved due to patient size.  Kidneys and ureters: No hydronephrosis or stone.  Bladder: Unremarkable.  Reproductive: Unremarkable.  Bowel: No obstruction. Normal appendix.  Retroperitoneum: No mass or adenopathy.  Peritoneum: No ascites or pneumoperitoneum.  Vascular: No acute abnormality.  OSSEOUS: No acute abnormalities.  IMPRESSION: 1. No acute intra-abdominal findings. 2. Cholelithiasis and other chronic findings are noted above.   Electronically Signed   By: Tiburcio PeaJonathan  Atkins M.D.   On: 02/09/2014 23:54   Nm Hepato W/eject Fract  02/10/2014   CLINICAL DATA:  Abdominal pain  EXAM: NUCLEAR MEDICINE HEPATOBILIARY IMAGING WITH GALLBLADDER EF  TECHNIQUE: Sequential images of the abdomen were obtained out to 60 minutes following intravenous administration of radiopharmaceutical. After slow intravenous infusion of 3.81 micrograms  Cholecystokinin, gallbladder ejection fraction was determined.  RADIOPHARMACEUTICALS:  5 mCi Tc-5144m Choletec IV  COMPARISON:  None; correlation CT abdomen and pelvis 02/09/2014  FINDINGS: Normal tracer extraction from bloodstream indicating normal hepatocellular function.  Prompt excretion of tracer into biliary tree.  Gallbladder visualized at 15 min.  Small bowel visualized at 31 min.  No focal hepatic retention of tracer.  Subjectively, no significant emptying of tracer from the gallbladder occurred following CCK administration.  Calculated gallbladder ejection fraction is 6%, abnormally low.  Patient experienced no symptoms following CCK administration.  IMPRESSION: Patent biliary tree.  Abnormal gallbladder response to CCK stimulation with a markedly decreased gallbladder ejection fraction of 6%.   Electronically Signed   By: Ulyses SouthwardMark  Boles M.D.   On: 02/10/2014 13:37   [place] Abdomen Limited Ruq  02/10/2014   CLINICAL DATA:  Cholelithiasis, RIGHT upper quadrant pain for 1 day, personal history of obesity, UTI, acute blood loss anemia, gastric ulcer  EXAM: US ABDOMEN LIMITED - RIGHT UPPER QUADRANT  COMPARISON:  02/09/2014 CT abdomen  FINDINGS: Examination is severely limited/nondiagnostic due to body habitus (BMI = 66) and  bowel gas in RIGHT upper quadrant as well as increased hepatic echogenicity.  Gallbladder:  Could not be visualized due to combination of bowel gas and sound attenuation secondary to body habitus.  Common bile duct:  Diameter: Could not be visualized due to a combination of bowel gas and sound attenuation secondary to body habitus.  Liver:  Poorly visualized. Appears diffusely echogenic consistent with fatty infiltration as seen on prior CT. Unable to exclude hepatic pathology.  No RIGHT upper quadrant free fluid.  IMPRESSION: Nondiagnostic exam secondary to body habitus and bowel gas as discussed above.  Fatty infiltration of liver.   Electronically Signed   By: Ulyses Southward M.D.   On:  02/10/2014 10:06    Microbiology: No results found for this or any previous visit (from the past 240 hour(s)).   Labs: Basic Metabolic Panel:  Recent Labs Lab 02/09/14 2225 02/10/14 0551 02/11/14 0605  NA 140  --  141  K 3.7  --  4.0  CL 102  --  103  CO2 27  --  28  GLUCOSE 98  --  97  BUN 9  --  8  CREATININE 0.69 0.67 0.69  CALCIUM 9.2  --  8.6   Liver Function Tests:  Recent Labs Lab 02/09/14 2225 02/11/14 0605  AST 13 16  ALT 19 22  ALKPHOS 74 73  BILITOT 0.2* 0.3  PROT 7.0 6.5  ALBUMIN 3.4* 3.1*    Recent Labs Lab 02/10/14 0027  LIPASE 25   No results found for this basename: AMMONIA,  in the last 168 hours CBC:  Recent Labs Lab 02/09/14 2225 02/10/14 0551 02/11/14 0605  WBC 9.0 8.7 7.3  NEUTROABS 5.0  --   --   HGB 13.5 12.5* 12.5*  HCT 40.2 38.1* 39.0  MCV 85.0 86.2 87.4  PLT 240 241 258   Cardiac Enzymes: No results found for this basename: CKTOTAL, CKMB, CKMBINDEX, TROPONINI,  in the last 168 hours BNP: BNP (last 3 results) No results found for this basename: PROBNP,  in the last 8760 hours CBG: No results found for this basename: GLUCAP,  in the last 168 hours     Signed:  Rhetta Mura  [facility] 02/11/2014, 2:11 PM

## 2014-02-11 NOTE — Progress Notes (Signed)
Subjective: Patient states he has ongoing right upper quadrant abdominal pain. He is tolerating a diet without emesis.  Objective: Vital signs in last 24 hours: Temp:  [97.4 F (36.3 C)-97.7 F (36.5 C)] 97.4 F (36.3 C) (10/28 0652) Pulse Rate:  [59-74] 74 (10/28 0652) Resp:  [20] 20 (10/28 0652) BP: (117-132)/(56-69) 129/69 mmHg (10/28 0652) SpO2:  [96 %-100 %] 97 % (10/28 0652) Last BM Date: 02/11/14  Intake/Output from previous day: 10/27 0701 - 10/28 0700 In: 240 [P.O.:240] Out: 700 [Urine:700] Intake/Output this shift:    General appearance: alert, cooperative and no distress GI: Abdomen is soft. While patient was talking, I palpated his right upper quadrant and he did not complain of any pain or tenderness. No rigidity is noted.  Lab Results:   Recent Labs  02/10/14 0551 02/11/14 0605  WBC 8.7 7.3  HGB 12.5* 12.5*  HCT 38.1* 39.0  PLT 241 258   BMET  Recent Labs  02/09/14 2225 02/10/14 0551 02/11/14 0605  NA 140  --  141  K 3.7  --  4.0  CL 102  --  103  CO2 27  --  28  GLUCOSE 98  --  97  BUN 9  --  8  CREATININE 0.69 0.67 0.69  CALCIUM 9.2  --  8.6   PT/INR No results found for this basename: LABPROT, INR,  in the last 72 hours  Studies/Results: Ct Abdomen Pelvis W Contrast  02/09/2014   CLINICAL DATA:  Right-sided abdominal pain for 1 day. Initial encounter  EXAM: CT ABDOMEN AND PELVIS WITH CONTRAST  TECHNIQUE: Multidetector CT imaging of the abdomen and pelvis was performed using the standard protocol following bolus administration of intravenous contrast.  CONTRAST:  100mL OMNIPAQUE IOHEXOL 300 MG/ML SOLN, 25mL OMNIPAQUE IOHEXOL 300 MG/ML SOLN  COMPARISON:  01/28/2013  FINDINGS: BODY WALL: Chronic enlargement of bilateral inguinal lymph nodes.  LOWER CHEST: Stable, benign sub centimeter pulmonary nodules in the subpleural left lower lobe and right lower lobe.  ABDOMEN/PELVIS:  Liver: Diffuse fatty infiltration of the liver.  Biliary:  Cholelithiasis.  No evidence of acute cholecystitis.  Pancreas: Unremarkable.  Spleen: Unremarkable.  Adrenals: Stable 2 cm low-density nodules in the left adrenal gland which was previously reported as a probable adenoma. Given the marked low-density for a contrast-enhanced CT, this could also represent a myelolipoma with macroscopic fat not clearly resolved due to patient size.  Kidneys and ureters: No hydronephrosis or stone.  Bladder: Unremarkable.  Reproductive: Unremarkable.  Bowel: No obstruction. Normal appendix.  Retroperitoneum: No mass or adenopathy.  Peritoneum: No ascites or pneumoperitoneum.  Vascular: No acute abnormality.  OSSEOUS: No acute abnormalities.  IMPRESSION: 1. No acute intra-abdominal findings. 2. Cholelithiasis and other chronic findings are noted above.   Electronically Signed   By: Tiburcio PeaJonathan  Watts M.D.   On: 02/09/2014 23:54   Nm Hepato W/eject Fract  02/10/2014   CLINICAL DATA:  Abdominal pain  EXAM: NUCLEAR MEDICINE HEPATOBILIARY IMAGING WITH GALLBLADDER EF  TECHNIQUE: Sequential images of the abdomen were obtained out to 60 minutes following intravenous administration of radiopharmaceutical. After slow intravenous infusion of 3.81 micrograms Cholecystokinin, gallbladder ejection fraction was determined.  RADIOPHARMACEUTICALS:  5 mCi Tc-77105m Choletec IV  COMPARISON:  None; correlation CT abdomen and pelvis 02/09/2014  FINDINGS: Normal tracer extraction from bloodstream indicating normal hepatocellular function.  Prompt excretion of tracer into biliary tree.  Gallbladder visualized at 15 min.  Small bowel visualized at 31 min.  No focal hepatic retention of tracer.  Subjectively, no significant emptying of tracer from the gallbladder occurred following CCK administration.  Calculated gallbladder ejection fraction is 6%, abnormally low.  Patient experienced no symptoms following CCK administration.  IMPRESSION: Patent biliary tree.  Abnormal gallbladder response to CCK stimulation  with a markedly decreased gallbladder ejection fraction of 6%.   Electronically Signed   By: Ulyses SouthwardMark  Boles M.D.   On: 02/10/2014 13:37   [place] Abdomen Limited Ruq  02/10/2014   CLINICAL DATA:  Cholelithiasis, RIGHT upper quadrant pain for 1 day, personal history of obesity, UTI, acute blood loss anemia, gastric ulcer  EXAM: US ABDOMEN LIMITED - RIGHT UPPER QUADRANT  COMPARISON:  02/09/2014 CT abdomen  FINDINGS: Examination is severely limited/nondiagnostic due to body habitus (BMI = 66) and bowel gas in RIGHT upper quadrant as well as increased hepatic echogenicity.  Gallbladder:  Could not be visualized due to combination of bowel gas and sound attenuation secondary to body habitus.  Common bile duct:  Diameter: Could not be visualized due to a combination of bowel gas and sound attenuation secondary to body habitus.  Liver:  Poorly visualized. Appears diffusely echogenic consistent with fatty infiltration as seen on prior CT. Unable to exclude hepatic pathology.  No RIGHT upper quadrant free fluid.  IMPRESSION: Nondiagnostic exam secondary to body habitus and bowel gas as discussed above.  Fatty infiltration of liver.   Electronically Signed   By: Ulyses SouthwardMark  Boles M.D.   On: 02/10/2014 10:06    Anti-infectives: Anti-infectives   None      Assessment/Plan: Impression: The patient has a low gallbladder ejection fraction, he does not have acute cholecystitis and I would be reluctant to do a laparoscopic cholecystectomy unless absolutely necessary given his high surgical risk. I did explain this to the patient. It could be followed at home as an outpatient.  LOS: 2 days    Lainee Lehrman A 02/11/2014

## 2014-02-11 NOTE — Progress Notes (Signed)
Pt alert and oriented. Pt stable upon discharge. Pt iv's were removed and the skin was clean, dry, and intact. Pt refused discharged paperwork and refused to sign discharge paperwork. Pt was told that his prescriptions were sent to his pharmacy. MD notified.

## 2014-02-11 NOTE — Progress Notes (Signed)
Pt states he is nauseated and vomited, clear liquid noted on floor. Pt also complains of diarrhea, formed stool noted in toilet. MD aware.

## 2014-02-11 NOTE — Progress Notes (Signed)
Notified MD, lab called. Pt growing gram negative cocci. Awaiting call back.

## 2014-02-12 ENCOUNTER — Encounter (HOSPITAL_COMMUNITY): Payer: Self-pay | Admitting: Emergency Medicine

## 2014-02-12 ENCOUNTER — Emergency Department (HOSPITAL_COMMUNITY)
Admission: EM | Admit: 2014-02-12 | Discharge: 2014-02-13 | Disposition: A | Discharge status: Home / Self Care | Payer: Medicaid Other | Attending: Emergency Medicine | Admitting: Emergency Medicine

## 2014-02-12 DIAGNOSIS — Z87440 Personal history of urinary (tract) infections: Secondary | ICD-10-CM | POA: Insufficient documentation

## 2014-02-12 DIAGNOSIS — Z862 Personal history of diseases of the blood and blood-forming organs and certain disorders involving the immune mechanism: Secondary | ICD-10-CM | POA: Diagnosis not present

## 2014-02-12 DIAGNOSIS — Z880 Allergy status to penicillin: Secondary | ICD-10-CM | POA: Diagnosis not present

## 2014-02-12 DIAGNOSIS — Z872 Personal history of diseases of the skin and subcutaneous tissue: Secondary | ICD-10-CM | POA: Diagnosis not present

## 2014-02-12 DIAGNOSIS — Z8739 Personal history of other diseases of the musculoskeletal system and connective tissue: Secondary | ICD-10-CM | POA: Insufficient documentation

## 2014-02-12 DIAGNOSIS — Z8719 Personal history of other diseases of the digestive system: Secondary | ICD-10-CM | POA: Diagnosis not present

## 2014-02-12 DIAGNOSIS — E669 Obesity, unspecified: Secondary | ICD-10-CM | POA: Insufficient documentation

## 2014-02-12 DIAGNOSIS — R109 Unspecified abdominal pain: Secondary | ICD-10-CM | POA: Diagnosis present

## 2014-02-12 DIAGNOSIS — R1084 Generalized abdominal pain: Secondary | ICD-10-CM | POA: Insufficient documentation

## 2014-02-12 DIAGNOSIS — Z720 Tobacco use: Secondary | ICD-10-CM | POA: Diagnosis not present

## 2014-02-12 HISTORY — DX: Calculus of gallbladder without cholecystitis without obstruction: K80.20

## 2014-02-12 NOTE — ED Notes (Signed)
Pt reporting continued abdominal pain.  Reports that he has recently been admitted for same.

## 2014-02-12 NOTE — ED Notes (Addendum)
abd pain,  Nausea, vomiting , diarrhea, Admitted here on 10/26 and released 10/28,  Dizzy, Told that he had gall stones Says he did not get his Rx, he did not have the money  Pain in upper back also

## 2014-02-12 NOTE — ED Provider Notes (Signed)
CSN: [id number]     Arrival date & time 02/12/14  2113 History  This chart was scribed for Steven Raceravid Arush Gatliff, MD by Richarda Overlieichard Holland, ED Scribe. This patient was seen in room [facility] and the patient's care was started 11:57 PM.      Chief Complaint  Patient presents with  . Abdominal Pain    The history is provided by the patient. No language interpreter was used.   HPI Comments: [name] is a 28 y.o. male with a history of gall stones who presents to the Emergency Department complaining of recurrent, intermittent abdominal pain in his right side. Pt was admitted here on 10/26 and released 10/28, he was told that he had gall stones. He says that he did not get his Rx because he did not have the money. He reports pain in his upper back as well. He states that he feels worse now than he did last week. He reports associated nausea, vomiting and diarrhea as symptoms. He says that when he stands up he sometimes gets dizzy. Pt states he urinated more than normal today. He reports he had esophagogastroduodenoscopy in 2014. He states that his current symptoms feel different than his gastric ulcer symptoms.   Past Medical History  Diagnosis Date  . Gout   . Cellulitis   . Obesity   . Erosive esophagitis 09/01/2012    NSAID induced.  . Gastric ulcer with hemorrhage 09/02/2012    s/p bleeding control tx. Per Dr. Jena Gaussourk  . Acute blood loss anemia 09/02/2012    S/p 1 unit rbcs  . UTI (lower urinary tract infection)   . Gall stones    Past Surgical History  Procedure Laterality Date  . Tonsillectomy    . Esophagogastroduodenoscopy Left 09/01/2012    WUJ:WJXBJYRMR:Distal esophageal erosions consistent with erosive reflux esophagitis/Pre-pyloric benign-appearing gastric ulcer with bleeding stigmata status post bleeding control therapy as described above  . Esophagogastroduodenoscopy N/A 12/13/2012    Procedure: ESOPHAGOGASTRODUODENOSCOPY (EGD);  Surgeon: Corbin Adeobert M Rourk, MD;  Location: [facility];   Service: Endoscopy;  Laterality: N/A;  11:30   Family History  Problem Relation Age of Onset  . Heart failure Mother    History  Substance Use Topics  . Smoking status: Light Tobacco Smoker -- 0.25 packs/day for 7 years    Types: Cigarettes  . Smokeless tobacco: Current User    Types: Snuff  . Alcohol Use: No    Review of Systems  Constitutional: Negative for fever and chills.  Respiratory: Negative for shortness of breath.   Cardiovascular: Negative for chest pain.  Gastrointestinal: Positive for nausea and abdominal pain. Negative for vomiting, diarrhea and constipation.  Genitourinary: Negative for frequency, hematuria, flank pain and difficulty urinating.  Musculoskeletal: Negative for back pain, neck pain and neck stiffness.  Skin: Negative for rash and wound.  Neurological: Negative for dizziness, weakness and numbness.  All other systems reviewed and are negative.     Allergies  Asa; Nsaids; Penicillins; and Sulfa antibiotics  Home Medications   Prior to Admission medications   Medication Sig Start Date End Date Taking? Authorizing Provider  colchicine 0.6 MG tablet Take 1 tablet (0.6 mg total) by mouth daily. 01/08/14   Flint MelterElliott L Wentz, MD  pantoprazole (PROTONIX) 40 MG tablet Take 1 tablet (40 mg total) by mouth daily. 02/11/14   Rhetta MuraJai-Gurmukh Samtani, MD  traMADol (ULTRAM) 50 MG tablet Take 1 tablet (50 mg total) by mouth every 6 (six) hours as needed. 01/22/14   Layla MawKristen N  Ward, DO   BP 147/88  Pulse 78  Temp(Src) 97.7 F (36.5 C) (Oral)  Resp 20  Ht 5\' 7"  (1.702 m)  Wt 411 lb (186.428 kg)  BMI 64.36 kg/m2  SpO2 99% Physical Exam  Nursing note and vitals reviewed. Constitutional: He is oriented to person, place, and time. He appears well-developed and well-nourished. No distress.  Obese  HENT:  Head: Normocephalic and atraumatic.  Mouth/Throat: Oropharynx is clear and moist.  Eyes: EOM are normal. Pupils are equal, round, and reactive to light.  Neck:  Normal range of motion. Neck supple.  Cardiovascular: Normal rate and regular rhythm.   Pulmonary/Chest: Effort normal and breath sounds normal. No respiratory distress. He has no wheezes. He has no rales.  Abdominal: Soft. Bowel sounds are normal. He exhibits no distension and no mass. There is tenderness (diffuse tenderness throughout. No rebound or guarding.). There is no rebound and no guarding.  Musculoskeletal: Normal range of motion. He exhibits no edema and no tenderness.  No CVA tenderness.  Neurological: He is alert and oriented to person, place, and time.  Skin: Skin is warm and dry. No rash noted. No erythema.  Psychiatric: He has a normal mood and affect. His behavior is normal.    ED Course  Procedures  DIAGNOSTIC STUDIES: Oxygen Saturation is 99% on RA, normal by my interpretation.    COORDINATION OF CARE: 11:12 PM Discussed treatment plan with pt at bedside and pt agreed to plan.   Labs Review Labs Reviewed - No data to display  Imaging Review No results found.   EKG Interpretation None      MDM   Final diagnoses:  None    I personally performed the services described in this documentation, which was scribed in my presence. The recorded information has been reviewed and is accurate.  Vital signs stable. Discussed with Dr. Lovell SheehanJenkins. No further workup necessary at this point. Advised follow-up in his office. We'll discharge with pain medications and return precautions. Patient is has voiced understanding.     Steven Raceravid [name], MD 02/13/14 [phone]

## 2014-02-13 LAB — COMPREHENSIVE METABOLIC PANEL
ALBUMIN: 3.2 g/dL — AB (ref 3.5–5.2)
ALT: 20 U/L (ref 0–53)
AST: 13 U/L (ref 0–37)
Alkaline Phosphatase: 75 U/L (ref 39–117)
Anion gap: 9 (ref 5–15)
BUN: 8 mg/dL (ref 6–23)
CHLORIDE: 102 meq/L (ref 96–112)
CO2: 30 mEq/L (ref 19–32)
CREATININE: 0.66 mg/dL (ref 0.50–1.35)
Calcium: 9 mg/dL (ref 8.4–10.5)
GFR calc Af Amer: >90 mL/min (ref >90)
GFR calc non Af Amer: >90 mL/min (ref >90)
Glucose, Bld: 104 mg/dL — ABNORMAL HIGH (ref 70–99)
POTASSIUM: 4 meq/L (ref 3.7–5.3)
SODIUM: 141 meq/L (ref 137–147)
Total Bilirubin: 0.2 mg/dL — ABNORMAL LOW (ref 0.3–1.2)
Total Protein: 6.9 g/dL (ref 6.0–8.3)

## 2014-02-13 LAB — URINALYSIS, ROUTINE W REFLEX MICROSCOPIC
Bilirubin Urine: NEGATIVE
GLUCOSE, UA: NEGATIVE mg/dL
Hgb urine dipstick: NEGATIVE
Ketones, ur: NEGATIVE mg/dL
LEUKOCYTES UA: NEGATIVE
Nitrite: NEGATIVE
Protein, ur: NEGATIVE mg/dL
Specific Gravity, Urine: 1.015 (ref 1.005–1.030)
Urobilinogen, UA: 0.2 mg/dL (ref 0.0–1.0)
pH: 8.5 — ABNORMAL HIGH (ref 5.0–8.0)

## 2014-02-13 LAB — CBC WITH DIFFERENTIAL/PLATELET
BASOS ABS: 0 10*3/uL (ref 0.0–0.1)
BASOS PCT: 0 % (ref 0–1)
Eosinophils Absolute: 0.6 10*3/uL (ref 0.0–0.7)
Eosinophils Relative: 7 % — ABNORMAL HIGH (ref 0–5)
HCT: 39.2 % (ref 39.0–52.0)
Hemoglobin: 12.9 g/dL — ABNORMAL LOW (ref 13.0–17.0)
Lymphocytes Relative: 24 % (ref 12–46)
Lymphs Abs: 2 10*3/uL (ref 0.7–4.0)
MCH: 28.4 pg (ref 26.0–34.0)
MCHC: 32.9 g/dL (ref 30.0–36.0)
MCV: 86.2 fL (ref 78.0–100.0)
MONO ABS: 0.7 10*3/uL (ref 0.1–1.0)
Monocytes Relative: 8 % (ref 3–12)
NEUTROS ABS: 5.3 10*3/uL (ref 1.7–7.7)
NEUTROS PCT: 61 % (ref 43–77)
Platelets: 236 10*3/uL (ref 150–400)
RBC: 4.55 MIL/uL (ref 4.22–5.81)
RDW: 13.5 % (ref 11.5–15.5)
WBC: 8.5 10*3/uL (ref 4.0–10.5)

## 2014-02-13 LAB — LIPASE, BLOOD: LIPASE: 23 U/L (ref 11–59)

## 2014-02-13 MED ORDER — SODIUM CHLORIDE 0.9 % IV BOLUS (SEPSIS)
1000.0000 mL | Freq: Once | INTRAVENOUS | Status: AC
  Administered 2014-02-13: 1000 mL via INTRAVENOUS

## 2014-02-13 MED ORDER — ONDANSETRON HCL 4 MG/2ML IJ SOLN
4.0000 mg | Freq: Once | INTRAMUSCULAR | Status: AC
  Administered 2014-02-13: 4 mg via INTRAVENOUS
  Filled 2014-02-13: qty 2

## 2014-02-13 MED ORDER — ONDANSETRON 4 MG PO TBDP: ORAL_TABLET | ORAL | Status: DC

## 2014-02-13 MED ORDER — OXYCODONE-ACETAMINOPHEN 5-325 MG PO TABS: 1.0000 | ORAL_TABLET | ORAL | Status: DC | PRN

## 2014-02-13 MED ORDER — DICYCLOMINE HCL 20 MG PO TABS: 20.0000 mg | ORAL_TABLET | Freq: Two times a day (BID) | ORAL | Status: DC | PRN

## 2014-02-13 MED ORDER — MORPHINE SULFATE 4 MG/ML IJ SOLN
4.0000 mg | Freq: Once | INTRAMUSCULAR | Status: AC
  Administered 2014-02-13: 4 mg via INTRAVENOUS
  Filled 2014-02-13: qty 1

## 2014-02-13 NOTE — Discharge Instructions (Signed)

## 2014-02-13 NOTE — ED Notes (Signed)
Pt angry that he is not being given additional medications for pain.  Explained to patient that since he is to be driving himself home, he cannot be given additional narcotics.  Further reinforced with pt that he has had medications sent to pharmacy earlier today and he only has to go pick them up. Reinforced with pt to follow up with PCP and surgeon as recommended by all involved providers.

## 2014-02-13 NOTE — ED Notes (Signed)
Pt reporting that medication for pain "helped for a little while".  Reporting increase in pain again.  Denies nausea.

## 2014-02-13 NOTE — ED Notes (Signed)
Patient states IV site hurts and bleeding. States that he needs pain medication.

## 2014-02-26 ENCOUNTER — Encounter (HOSPITAL_COMMUNITY): Payer: Self-pay | Admitting: Emergency Medicine

## 2014-02-26 ENCOUNTER — Emergency Department (HOSPITAL_COMMUNITY): Payer: Medicaid Other

## 2014-02-26 ENCOUNTER — Emergency Department (HOSPITAL_COMMUNITY)
Admission: EM | Admit: 2014-02-26 | Discharge: 2014-02-26 | Disposition: A | Discharge status: Home / Self Care | Payer: Medicaid Other | Attending: Emergency Medicine | Admitting: Emergency Medicine

## 2014-02-26 DIAGNOSIS — Z87440 Personal history of urinary (tract) infections: Secondary | ICD-10-CM | POA: Insufficient documentation

## 2014-02-26 DIAGNOSIS — R059 Cough, unspecified: Secondary | ICD-10-CM

## 2014-02-26 DIAGNOSIS — Z880 Allergy status to penicillin: Secondary | ICD-10-CM | POA: Insufficient documentation

## 2014-02-26 DIAGNOSIS — J209 Acute bronchitis, unspecified: Secondary | ICD-10-CM | POA: Diagnosis not present

## 2014-02-26 DIAGNOSIS — R05 Cough: Secondary | ICD-10-CM

## 2014-02-26 DIAGNOSIS — R042 Hemoptysis: Secondary | ICD-10-CM | POA: Diagnosis not present

## 2014-02-26 DIAGNOSIS — Z8719 Personal history of other diseases of the digestive system: Secondary | ICD-10-CM | POA: Diagnosis not present

## 2014-02-26 DIAGNOSIS — Z7951 Long term (current) use of inhaled steroids: Secondary | ICD-10-CM | POA: Diagnosis not present

## 2014-02-26 DIAGNOSIS — Z720 Tobacco use: Secondary | ICD-10-CM | POA: Insufficient documentation

## 2014-02-26 DIAGNOSIS — R51 Headache: Secondary | ICD-10-CM | POA: Diagnosis not present

## 2014-02-26 DIAGNOSIS — R040 Epistaxis: Secondary | ICD-10-CM | POA: Diagnosis present

## 2014-02-26 DIAGNOSIS — E669 Obesity, unspecified: Secondary | ICD-10-CM | POA: Insufficient documentation

## 2014-02-26 DIAGNOSIS — Z79899 Other long term (current) drug therapy: Secondary | ICD-10-CM | POA: Insufficient documentation

## 2014-02-26 DIAGNOSIS — R519 Headache, unspecified: Secondary | ICD-10-CM

## 2014-02-26 DIAGNOSIS — J018 Other acute sinusitis: Secondary | ICD-10-CM

## 2014-02-26 DIAGNOSIS — J40 Bronchitis, not specified as acute or chronic: Secondary | ICD-10-CM

## 2014-02-26 LAB — CBC WITH DIFFERENTIAL/PLATELET
Basophils Absolute: 0 10*3/uL (ref 0.0–0.1)
Basophils Relative: 0 % (ref 0–1)
Eosinophils Absolute: 0.3 10*3/uL (ref 0.0–0.7)
Eosinophils Relative: 3 % (ref 0–5)
HCT: 39.6 % (ref 39.0–52.0)
HEMOGLOBIN: 13.2 g/dL (ref 13.0–17.0)
LYMPHS ABS: 1.9 10*3/uL (ref 0.7–4.0)
Lymphocytes Relative: 20 % (ref 12–46)
MCH: 28.7 pg (ref 26.0–34.0)
MCHC: 33.3 g/dL (ref 30.0–36.0)
MCV: 86.1 fL (ref 78.0–100.0)
MONOS PCT: 10 % (ref 3–12)
Monocytes Absolute: 1 10*3/uL (ref 0.1–1.0)
NEUTROS ABS: 6.6 10*3/uL (ref 1.7–7.7)
Neutrophils Relative %: 67 % (ref 43–77)
Platelets: 258 10*3/uL (ref 150–400)
RBC: 4.6 MIL/uL (ref 4.22–5.81)
RDW: 13.4 % (ref 11.5–15.5)
WBC: 9.8 10*3/uL (ref 4.0–10.5)

## 2014-02-26 LAB — BASIC METABOLIC PANEL
Anion gap: 11 (ref 5–15)
BUN: 11 mg/dL (ref 6–23)
CHLORIDE: 102 meq/L (ref 96–112)
CO2: 27 mEq/L (ref 19–32)
Calcium: 8.7 mg/dL (ref 8.4–10.5)
Creatinine, Ser: 0.69 mg/dL (ref 0.50–1.35)
GFR calc Af Amer: >90 mL/min (ref >90)
GFR calc non Af Amer: >90 mL/min (ref >90)
GLUCOSE: 113 mg/dL — AB (ref 70–99)
POTASSIUM: 3.7 meq/L (ref 3.7–5.3)
Sodium: 140 mEq/L (ref 137–147)

## 2014-02-26 LAB — D-DIMER, QUANTITATIVE (NOT AT ARMC): D DIMER QUANT: 0.7 ug{FEU}/mL — AB (ref 0.00–0.48)

## 2014-02-26 LAB — TROPONIN I: Troponin I: <0.3 ng/mL (ref <0.30)

## 2014-02-26 LAB — POC OCCULT BLOOD, ED: Fecal Occult Bld: NEGATIVE

## 2014-02-26 LAB — PRO B NATRIURETIC PEPTIDE: PRO B NATRI PEPTIDE: 24.4 pg/mL (ref 0–125)

## 2014-02-26 MED ORDER — ALBUTEROL SULFATE HFA 108 (90 BASE) MCG/ACT IN AERS: 2.0000 | INHALATION_SPRAY | Freq: Four times a day (QID) | RESPIRATORY_TRACT | Status: DC | PRN

## 2014-02-26 MED ORDER — MOMETASONE FUROATE 50 MCG/ACT NA SUSP: 2.0000 | Freq: Every day | NASAL | Status: DC

## 2014-02-26 MED ORDER — IOHEXOL 350 MG/ML SOLN
150.0000 mL | Freq: Once | INTRAVENOUS | Status: AC | PRN
  Administered 2014-02-26: 150 mL via INTRAVENOUS

## 2014-02-26 MED ORDER — SODIUM CHLORIDE 0.9 % IJ SOLN
INTRAMUSCULAR | Status: AC
  Filled 2014-02-26: qty 500

## 2014-02-26 MED ORDER — DOXYCYCLINE HYCLATE 100 MG PO CAPS: 100.0000 mg | ORAL_CAPSULE | Freq: Two times a day (BID) | ORAL | Status: DC

## 2014-02-26 NOTE — ED Notes (Signed)
Pt reports waking up this morning with blood on his pillow and blood coming from his nose. States he was gone through two washrags. Bleeding is controlled at present.

## 2014-02-26 NOTE — Discharge Instructions (Signed)
Sinusitis Take antibiotics and nasal spray as prescribed. Follow-up with your doctor. Return to ED if you develop new or worsening symptoms. Sinusitis is redness, soreness, and inflammation of the paranasal sinuses. Paranasal sinuses are air pockets within the bones of your face (beneath the eyes, the middle of the forehead, or above the eyes). In healthy paranasal sinuses, mucus is able to drain out, and air is able to circulate through them by way of your nose. However, when your paranasal sinuses are inflamed, mucus and air can become trapped. This can allow bacteria and other germs to grow and cause infection. Sinusitis can develop quickly and last only a short time (acute) or continue over a long period (chronic). Sinusitis that lasts for more than 12 weeks is considered chronic.  CAUSES  Causes of sinusitis include:  Allergies.  Structural abnormalities, such as displacement of the cartilage that separates your nostrils (deviated septum), which can decrease the air flow through your nose and sinuses and affect sinus drainage.  Functional abnormalities, such as when the small hairs (cilia) that line your sinuses and help remove mucus do not work properly or are not present. SIGNS AND SYMPTOMS  Symptoms of acute and chronic sinusitis are the same. The primary symptoms are pain and pressure around the affected sinuses. Other symptoms include:  Upper toothache.  Earache.  Headache.  Bad breath.  Decreased sense of smell and taste.  A cough, which worsens when you are lying flat.  Fatigue.  Fever.  Thick drainage from your nose, which often is green and may contain pus (purulent).  Swelling and warmth over the affected sinuses. DIAGNOSIS  Your health care provider will perform a physical exam. During the exam, your health care provider may:  Look in your nose for signs of abnormal growths in your nostrils (nasal polyps).  Tap over the affected sinus to check for signs of  infection.  View the inside of your sinuses (endoscopy) using an imaging device that has a light attached (endoscope). If your health care provider suspects that you have chronic sinusitis, one or more of the following tests may be recommended:  Allergy tests.  Nasal culture. A sample of mucus is taken from your nose, sent to a lab, and screened for bacteria.  Nasal cytology. A sample of mucus is taken from your nose and examined by your health care provider to determine if your sinusitis is related to an allergy. TREATMENT  Most cases of acute sinusitis are related to a viral infection and will resolve on their own within 10 days. Sometimes medicines are prescribed to help relieve symptoms (pain medicine, decongestants, nasal steroid sprays, or saline sprays).  However, for sinusitis related to a bacterial infection, your health care provider will prescribe antibiotic medicines. These are medicines that will help kill the bacteria causing the infection.  Rarely, sinusitis is caused by a fungal infection. In theses cases, your health care provider will prescribe antifungal medicine. For some cases of chronic sinusitis, surgery is needed. Generally, these are cases in which sinusitis recurs more than 3 times per year, despite other treatments. HOME CARE INSTRUCTIONS   Drink plenty of water. Water helps thin the mucus so your sinuses can drain more easily.  Use a humidifier.  Inhale steam 3 to 4 times a day (for example, sit in the bathroom with the shower running).  Apply a warm, moist washcloth to your face 3 to 4 times a day, or as directed by your health care provider.  Use saline  nasal sprays to help moisten and clean your sinuses.  Take medicines only as directed by your health care provider.  If you were prescribed either an antibiotic or antifungal medicine, finish it all even if you start to feel better. SEEK IMMEDIATE MEDICAL CARE IF:  You have increasing pain or severe  headaches.  You have nausea, vomiting, or drowsiness.  You have swelling around your face.  You have vision problems.  You have a stiff neck.  You have difficulty breathing. MAKE SURE YOU:   Understand these instructions.  Will watch your condition.  Will get help right away if you are not doing well or get worse. Document Released: 04/03/2005 Document Revised: 08/18/2013 Document Reviewed: 04/18/2011 [facility]ExitCare Patient Information 2015 [employer] [place]. This information is not intended to replace advice given to you by your health care provider. Make sure you discuss any questions you have with your health care provider.

## 2014-02-26 NOTE — ED Notes (Signed)
Pt reports cough, sinus drainage since Monday. Headache x 2 days.

## 2014-02-26 NOTE — ED Provider Notes (Signed)
CSN: [id number]     Arrival date & time 02/26/14  [id number] History  This chart was scribed for Glynn OctaveStephen Anjulie Dipierro, MD by Tonye RoyaltyJoshua Chen, ED Scribe. This patient was seen in room [facility] and the patient's care was started at 7:11 AM.    Chief Complaint  Patient presents with  . Epistaxis   The history is provided by the patient. No language interpreter was used.   HPI Comments: [name] is a 28 y.o. male who presents to the Emergency Department complaining of epistaxis upon waking this morning. He denies blood in his throat. He notes that he has been sick for the past 10 days with cough, rhinorrhea, nausea, and vomiting. He reports associated headache to his left face that has been constant with gradual onset 4 days ago, worse with coughing. He states he has coughed up small clots of blood a few times, but has not today. He reports chest pain when coughing. He states he vomited a few times this morning and states it is not always associated with his cough. Denies any blood in his vomit. He states he has had abdominal pain and diarrhea with red blood a few times that are not painful, last occurrence 3-4 days ago. He states he currently has gallstones and has history of ulcers; however, he states he is scheduled for a cholecystectomy. He reports associated shortness of breath, dizziness, lightheadedness, and body aches. He denies fever, vision blurriness, or double vision.Marland Kitchen. He denies recent travel or long trips and denies pain or swelling in his legs above baseline swelling. He states he has not had a flu shot this year. He states he normally gets sick this time of year but treats with OTC medication. He states he saw his PCP 6 days ago, who prescribed blood pressure medication, Allopurinol, Neurontin, and Xantac. He states he did not have a PCP prior to that visit. He does not take blood thinners.  Past Medical History  Diagnosis Date  . Gout   . Cellulitis   . Obesity   . Erosive esophagitis  09/01/2012    NSAID induced.  . Gastric ulcer with hemorrhage 09/02/2012    s/p bleeding control tx. Per Dr. Jena Gaussourk  . Acute blood loss anemia 09/02/2012    S/p 1 unit rbcs  . UTI (lower urinary tract infection)   . Gall stones    Past Surgical History  Procedure Laterality Date  . Tonsillectomy    . Esophagogastroduodenoscopy Left 09/01/2012    JXB:JYNWGNRMR:Distal esophageal erosions consistent with erosive reflux esophagitis/Pre-pyloric benign-appearing gastric ulcer with bleeding stigmata status post bleeding control therapy as described above  . Esophagogastroduodenoscopy N/A 12/13/2012    Procedure: ESOPHAGOGASTRODUODENOSCOPY (EGD);  Surgeon: Corbin Adeobert M Rourk, MD;  Location: [facility];  Service: Endoscopy;  Laterality: N/A;  11:30   Family History  Problem Relation Age of Onset  . Heart failure Mother    History  Substance Use Topics  . Smoking status: Light Tobacco Smoker -- 0.25 packs/day for 7 years    Types: Cigarettes  . Smokeless tobacco: Current User    Types: Snuff  . Alcohol Use: No    Review of Systems A complete 10 system review of systems was obtained and all systems are negative except as noted in the HPI and PMH.    Allergies  Asa; Nsaids; Penicillins; and Sulfa antibiotics  Home Medications   Prior to Admission medications   Medication Sig Start Date End Date Taking? Authorizing Provider  gabapentin (NEURONTIN) 300 MG  capsule Take 300 mg by mouth 3 (three) times daily.   Yes Historical Provider, MD  albuterol (PROVENTIL HFA;VENTOLIN HFA) 108 (90 BASE) MCG/ACT inhaler Inhale 2 puffs into the lungs every 6 (six) hours as needed for wheezing or shortness of breath. 02/26/14   Glynn OctaveStephen Kawana Hegel, MD  colchicine 0.6 MG tablet Take 1 tablet (0.6 mg total) by mouth daily. Patient not taking: Reported on 02/26/2014 01/08/14   Flint MelterElliott L Wentz, MD  dicyclomine (BENTYL) 20 MG tablet Take 1 tablet (20 mg total) by mouth 2 (two) times daily as needed for spasms. Patient not  taking: Reported on 02/26/2014 02/13/14   Loren Raceravid Yelverton, MD  doxycycline (VIBRAMYCIN) 100 MG capsule Take 1 capsule (100 mg total) by mouth 2 (two) times daily. 02/26/14   Glynn OctaveStephen Averi Cacioppo, MD  mometasone (NASONEX) 50 MCG/ACT nasal spray Place 2 sprays into the nose daily. 02/26/14 03/05/14  Glynn OctaveStephen Elda Dunkerson, MD  ondansetron (ZOFRAN ODT) 4 MG disintegrating tablet 4mg  ODT q4 hours prn nausea/vomit Patient not taking: Reported on 02/26/2014 02/13/14   Loren Raceravid Yelverton, MD  oxyCODONE-acetaminophen (PERCOCET) 5-325 MG per tablet Take 1-2 tablets by mouth every 4 (four) hours as needed for severe pain. Patient not taking: Reported on 02/26/2014 02/13/14   Loren Raceravid Yelverton, MD  pantoprazole (PROTONIX) 40 MG tablet Take 1 tablet (40 mg total) by mouth daily. Patient not taking: Reported on 02/26/2014 02/11/14   Rhetta MuraJai-Gurmukh Samtani, MD  traMADol (ULTRAM) 50 MG tablet Take 1 tablet (50 mg total) by mouth every 6 (six) hours as needed. Patient not taking: Reported on 02/26/2014 01/22/14   Kristen N Ward, DO   BP 132/75 mmHg  Pulse 75  Temp(Src) 98.4 F (36.9 C) (Oral)  Resp 26  Ht 5\' 7"  (1.702 m)  Wt 407 lb (184.614 kg)  BMI 63.73 kg/m2  SpO2 98% Physical Exam  Constitutional: He is oriented to person, place, and time. No distress.  Morbidly obese  HENT:  Head: Normocephalic and atraumatic.  Mouth/Throat: Oropharynx is clear and moist. No oropharyngeal exudate.  oropharynx clear, moist cough, nose shows no septal hematoma, no evidence of recent bleeding,   Eyes: Conjunctivae and EOM are normal. Pupils are equal, round, and reactive to light.  Neck: Normal range of motion. Neck supple.  No meningismus.  Cardiovascular: Normal rate, regular rhythm, normal heart sounds and intact distal pulses.   No murmur heard. Pulmonary/Chest: Effort normal. No respiratory distress.  breath sounds diminished bilaterally  Abdominal: Soft. There is no tenderness. There is no rebound and no guarding.   Genitourinary:  rectal exam has no hemorrhoids no gross blood  Musculoskeletal: Normal range of motion. He exhibits no edema or tenderness.  Neurological: He is alert and oriented to person, place, and time. No cranial nerve deficit. He exhibits normal muscle tone. Coordination normal.  No ataxia on finger to nose bilaterally. No pronator drift. 5/5 strength throughout. CN 2-12 intact. Negative Romberg. Equal grip strength. Sensation intact. Gait is normal.   Skin: Skin is warm.  Psychiatric: He has a normal mood and affect. His behavior is normal.  Nursing note and vitals reviewed.   ED Course  Procedures (including critical care time)  DIAGNOSTIC STUDIES: Oxygen Saturation is 98% on room air, normal by my interpretation.    COORDINATION OF CARE: 7:15 AM Discussed treatment plan with patient at beside, the patient agrees with the plan and has no further questions at this time.  9:29 AM Discussed with patient that his workup shows no evidence of pneumonia or blood clots,  though it did show some lung nodules that should be followed. He states he no longer smokes. He states his greatest complaint now is his headache that is waxing and waning with increased pain for 5-10 seconds even without coughing. Will treat with bronchitis with antibiotics. He denies Hx asthma of COPD. Will get a scan of his head.  Labs Review Labs Reviewed  BASIC METABOLIC PANEL - Abnormal; Notable for the following:    Glucose, Bld 113 (*)    All other components within normal limits  D-DIMER, QUANTITATIVE - Abnormal; Notable for the following:    D-Dimer, Quant 0.70 (*)    All other components within normal limits  CBC WITH DIFFERENTIAL  TROPONIN I  PRO B NATRIURETIC PEPTIDE  POC OCCULT BLOOD, ED    Imaging Review Ct Head Wo Contrast  02/26/2014   CLINICAL DATA:  Two day history of headache. Three-day history of sinus drainage  EXAM: CT HEAD WITHOUT CONTRAST  TECHNIQUE: Contiguous axial images were  obtained from the base of the skull through the vertex without intravenous contrast. Note that intravenous contrast had been administered are earlier in the day for CT angiogram chest.  COMPARISON:  None.  FINDINGS: The ventricles are normal in size and configuration. There is no mass, hemorrhage, extra-axial fluid collection, or midline shift. Gray-white compartments appear normal. No acute infarct apparent.  The bony calvarium appears intact. The visualized mastoid air cells are clear.  There is opacification of the visualized maxillary antra with probable polypoid formation in these areas. There is extensive polypoid formation throughout most of the ethmoid air cells bilaterally. There are polyps in the right frontal sinus region. There is slight mucosal thickening in the sphenoid sinuses. No intraorbital lesions are identified. There are multiple mildly prominent lymph nodes posterior to each mastoid air cell complex, likely reactive from the paranasal sinus disease.  IMPRESSION: Extensive paranasal sinus disease with extensive polypoid formation as noted above. Lymph node prominence in the posterior neck region bilaterally is probably due to the sinusitis. No intracranial lesion is seen. No intracranial mass, hemorrhage, or focal gray -white compartment lesion/acute infarct. 22   Electronically Signed   By: Bretta Bang M.D.   On: 02/26/2014 09:53   Ct Angio Chest Pe W/cm &/or Wo Cm  02/26/2014   CLINICAL DATA:  Hemoptysis  EXAM: CT ANGIOGRAPHY CHEST WITH CONTRAST  TECHNIQUE: Multidetector CT imaging of the chest was performed using the standard protocol during bolus administration of intravenous contrast. Multiplanar CT image reconstructions and MIPs were obtained to evaluate the vascular anatomy.  CONTRAST:  OMNIPAQUE IOHEXOL 350 MG/ML SOLN  COMPARISON:  February 26, 2014 chest radiograph  FINDINGS: There is no demonstrable pulmonary embolus. There is no thoracic aortic aneurysm or dissection.   On axial slice 56 series 4, there is a 6 mm nodular opacity in the lateral segment of the left lower lobe. On axial slice 54 series 4, there is a 3 mm nodular opacity in the lateral segment of the right lower lobe. There is mild atelectatic change in the medial aspect of the right middle lobe near the right heart border as well as in the inferior most aspect of the lingula. There is no frank edema or consolidation.  There is no demonstrable pulmonary embolus. Pericardium is not appreciably thickened. Heart is borderline enlarged.  In the visualized upper abdomen, there is hepatic steatosis. There is a left adrenal adenoma measuring 2.2 x 2.6 cm. There are no blastic or lytic bone lesions. Thyroid  appears normal.  Review of the MIP images confirms the above findings.  IMPRESSION: No demonstrable pulmonary embolus. Nodular opacities as noted above, largest measuring 6 mm. Followup of these nodular opacity should be based on Fleischner Society guidelines. If the patient is at high risk for bronchogenic carcinoma, follow-up chest CT at 6-12 months is recommended. If the patient is at low risk for bronchogenic carcinoma, follow-up chest CT at 12 months is recommended. This recommendation follows the consensus statement: Guidelines for Management of Small Pulmonary Nodules Detected on CT Scans: A Statement from the Fleischner Society as published in Radiology 2005;237:395-400.  Bibasilar atelectasis anteriorly. No airspace consolidation. No adenopathy. Heart borderline enlarged.  Left adrenal adenoma.  Hepatic steatosis.   Electronically Signed   By: Bretta Bang M.D.   On: 02/26/2014 09:08   Dg Chest Portable 1 View  02/26/2014   CLINICAL DATA:  Shortness of breath and cough.  EXAM: PORTABLE CHEST - 1 VIEW  COMPARISON:  11/07/2012  FINDINGS: No cardiomegaly. Negative aortic and hilar contours. Increased density of the lower chest related to underpenetration and shallow inspiration. Prominent density at the  medial right base is stable from prior. There is no edema, definitive consolidation, effusion, or pneumothorax.  IMPRESSION: No active disease.   Electronically Signed   By: Tiburcio Pea M.D.   On: 02/26/2014 07:41     EKG Interpretation   Date/Time:  Thursday February 26 2014 07:38:36 EST Ventricular Rate:  78 PR Interval:  164 QRS Duration: 97 QT Interval:  375 QTC Calculation: 427 R Axis:   82 Text Interpretation:  Sinus rhythm Low voltage, precordial leads No  significant change was found Confirmed by Manus Gunning  MD, [place] [phone]) on  02/26/2014 7:43:21 AM      MDM   Final diagnoses:  Cough  Hemoptysis  Headache  Other acute sinusitis  Bronchitis  10 day history of productive cough sometimes with blood streaks, chest pain with coughing, body aches and chills. Saw blood in his pillow this morning which led him to his ED visit. No current nasal bleeding. Gradual onset headache x 4 days.  No thunderclap onset.    Recent admission for cholelithiasis and abdominal pain.  CXR negative. FOBT negative. Hemoglobin stable.  CT negative for PE.  Patient informed of lung nodule findings and need for followup.  Will treat for bronchitis with doxycycline which should cover sinusitis as well.   I personally performed the services described in this documentation, which was scribed in my presence. The recorded information has been reviewed and is accurate.    Glynn Octave, MD 02/26/14 [phone]

## 2014-02-26 NOTE — ED Notes (Signed)
MD at bedside. 

## 2014-03-10 ENCOUNTER — Emergency Department (HOSPITAL_COMMUNITY): Payer: Medicaid Other

## 2014-03-10 ENCOUNTER — Inpatient Hospital Stay (HOSPITAL_COMMUNITY)
Admission: EM | Admit: 2014-03-10 | Discharge: 2014-03-16 | DRG: 872 | Disposition: A | Discharge status: Home / Self Care | Payer: Medicaid Other | Attending: Internal Medicine | Admitting: Internal Medicine

## 2014-03-10 ENCOUNTER — Encounter (HOSPITAL_COMMUNITY): Payer: Self-pay | Admitting: Emergency Medicine

## 2014-03-10 DIAGNOSIS — Z6841 Body Mass Index (BMI) 40.0 and over, adult: Secondary | ICD-10-CM

## 2014-03-10 DIAGNOSIS — Z7952 Long term (current) use of systemic steroids: Secondary | ICD-10-CM | POA: Diagnosis not present

## 2014-03-10 DIAGNOSIS — M109 Gout, unspecified: Secondary | ICD-10-CM | POA: Diagnosis present

## 2014-03-10 DIAGNOSIS — F17210 Nicotine dependence, cigarettes, uncomplicated: Secondary | ICD-10-CM | POA: Diagnosis present

## 2014-03-10 DIAGNOSIS — M10041 Idiopathic gout, right hand: Secondary | ICD-10-CM | POA: Diagnosis present

## 2014-03-10 DIAGNOSIS — Z886 Allergy status to analgesic agent status: Secondary | ICD-10-CM | POA: Diagnosis not present

## 2014-03-10 DIAGNOSIS — M79641 Pain in right hand: Secondary | ICD-10-CM | POA: Diagnosis not present

## 2014-03-10 DIAGNOSIS — Z8249 Family history of ischemic heart disease and other diseases of the circulatory system: Secondary | ICD-10-CM

## 2014-03-10 DIAGNOSIS — R52 Pain, unspecified: Secondary | ICD-10-CM

## 2014-03-10 DIAGNOSIS — Z79899 Other long term (current) drug therapy: Secondary | ICD-10-CM

## 2014-03-10 DIAGNOSIS — L03113 Cellulitis of right upper limb: Secondary | ICD-10-CM | POA: Diagnosis present

## 2014-03-10 DIAGNOSIS — M659 Synovitis and tenosynovitis, unspecified: Secondary | ICD-10-CM | POA: Diagnosis present

## 2014-03-10 DIAGNOSIS — R197 Diarrhea, unspecified: Secondary | ICD-10-CM | POA: Diagnosis not present

## 2014-03-10 DIAGNOSIS — M65841 Other synovitis and tenosynovitis, right hand: Secondary | ICD-10-CM | POA: Diagnosis present

## 2014-03-10 DIAGNOSIS — Z882 Allergy status to sulfonamides status: Secondary | ICD-10-CM | POA: Diagnosis not present

## 2014-03-10 DIAGNOSIS — M79643 Pain in unspecified hand: Secondary | ICD-10-CM

## 2014-03-10 DIAGNOSIS — A419 Sepsis, unspecified organism: Principal | ICD-10-CM | POA: Diagnosis present

## 2014-03-10 DIAGNOSIS — IMO0002 Reserved for concepts with insufficient information to code with codable children: Secondary | ICD-10-CM | POA: Diagnosis present

## 2014-03-10 DIAGNOSIS — L0291 Cutaneous abscess, unspecified: Secondary | ICD-10-CM

## 2014-03-10 DIAGNOSIS — L03119 Cellulitis of unspecified part of limb: Secondary | ICD-10-CM | POA: Diagnosis present

## 2014-03-10 DIAGNOSIS — Z880 Allergy status to penicillin: Secondary | ICD-10-CM | POA: Diagnosis not present

## 2014-03-10 DIAGNOSIS — L0390 Cellulitis, unspecified: Secondary | ICD-10-CM

## 2014-03-10 DIAGNOSIS — R0600 Dyspnea, unspecified: Secondary | ICD-10-CM

## 2014-03-10 LAB — CBC WITH DIFFERENTIAL/PLATELET
Basophils Absolute: 0 10*3/uL (ref 0.0–0.1)
Basophils Relative: 0 % (ref 0–1)
EOS ABS: 0.2 10*3/uL (ref 0.0–0.7)
Eosinophils Relative: 2 % (ref 0–5)
HCT: 42.4 % (ref 39.0–52.0)
Hemoglobin: 14.1 g/dL (ref 13.0–17.0)
LYMPHS ABS: 1.7 10*3/uL (ref 0.7–4.0)
LYMPHS PCT: 13 % (ref 12–46)
MCH: 28.8 pg (ref 26.0–34.0)
MCHC: 33.3 g/dL (ref 30.0–36.0)
MCV: 86.7 fL (ref 78.0–100.0)
Monocytes Absolute: 1.1 10*3/uL — ABNORMAL HIGH (ref 0.1–1.0)
Monocytes Relative: 8 % (ref 3–12)
NEUTROS PCT: 77 % (ref 43–77)
Neutro Abs: 10.1 10*3/uL — ABNORMAL HIGH (ref 1.7–7.7)
Platelets: 278 10*3/uL (ref 150–400)
RBC: 4.89 MIL/uL (ref 4.22–5.81)
RDW: 13.9 % (ref 11.5–15.5)
WBC: 13.2 10*3/uL — AB (ref 4.0–10.5)

## 2014-03-10 LAB — BASIC METABOLIC PANEL
Anion gap: 14 (ref 5–15)
BUN: 8 mg/dL (ref 6–23)
CO2: 26 mEq/L (ref 19–32)
Calcium: 9 mg/dL (ref 8.4–10.5)
Chloride: 98 mEq/L (ref 96–112)
Creatinine, Ser: 0.64 mg/dL (ref 0.50–1.35)
GFR calc Af Amer: >90 mL/min (ref >90)
GFR calc non Af Amer: >90 mL/min (ref >90)
GLUCOSE: 114 mg/dL — AB (ref 70–99)
Potassium: 4 mEq/L (ref 3.7–5.3)
Sodium: 138 mEq/L (ref 137–147)

## 2014-03-10 LAB — SEDIMENTATION RATE: Sed Rate: 39 mm/hr — ABNORMAL HIGH (ref 0–16)

## 2014-03-10 MED ORDER — CLINDAMYCIN PHOSPHATE 600 MG/50ML IV SOLN
600.0000 mg | Freq: Once | INTRAVENOUS | Status: AC
  Administered 2014-03-10: 600 mg via INTRAVENOUS
  Filled 2014-03-10: qty 50

## 2014-03-10 MED ORDER — MORPHINE SULFATE 4 MG/ML IJ SOLN
4.0000 mg | Freq: Once | INTRAMUSCULAR | Status: AC
  Administered 2014-03-10: 4 mg via INTRAVENOUS
  Filled 2014-03-10: qty 1

## 2014-03-10 MED ORDER — HYDROMORPHONE HCL 1 MG/ML IJ SOLN
1.0000 mg | Freq: Once | INTRAMUSCULAR | Status: AC
  Administered 2014-03-10: 1 mg via INTRAVENOUS
  Filled 2014-03-10: qty 1

## 2014-03-10 MED ORDER — ONDANSETRON HCL 4 MG/2ML IJ SOLN: 4.0000 mg | Freq: Once | INTRAMUSCULAR | Status: DC

## 2014-03-10 MED ORDER — ONDANSETRON HCL 4 MG/2ML IJ SOLN
4.0000 mg | Freq: Once | INTRAMUSCULAR | Status: AC
  Administered 2014-03-10: 4 mg via INTRAVENOUS
  Filled 2014-03-10: qty 2

## 2014-03-10 NOTE — Progress Notes (Addendum)
Patient ID: [name], male   DOB: Jun 14, 1985, 28 y.o.   MRN: [id number] As[facility] due to being overwhelmed with surgery and cases there.  Advised dr Neila Gearmorphus that flexor [facility]ynov[facility] So while I am happy to follow the patient while he is on the medical service, i can not do that type of surgery and if he needs surgery he will have to go [facility] for that   The pictures are inconclusive as to whether he has flexor tenosynovitis   He will receive IV therapy   My recommendation is that he still be evaluated by the hand service in the am  We can temporize the situation with IV therapy for now

## 2014-03-10 NOTE — ED Provider Notes (Signed)
MSE was initiated and I personally evaluated the patient and placed orders (if any) at  7:30 PM on March 10, 2014.  The patient appears stable so that the remainder of the MSE may be completed by another provider.  Pt presents with a 1 day history of right hand pain, redness and swelling.  He has a history of gout, which at first he thought this was a flare, but has become more painful with spreading redness c/w past episodes of cellulitis.  He removed a splint from this hand several days ago but does not remember exact location.  Has had subjective fevers.  PE: erythema and edema right 4th and 5 fth fingers, subtle red streaking hand.    Labs, IV , xrays, pain meds ordered, clindamycin IV. Moved to acute side.    Burgess AmorJulie Preston Weill, PA-C 03/10/14 1933  Shon Batonourtney F Horton, MD 03/11/14 [phone]

## 2014-03-10 NOTE — ED Notes (Signed)
Pt c/o rt hand and wrist pain since yesterday. Pt has hx of gout. Pt denies any injury.

## 2014-03-10 NOTE — ED Provider Notes (Signed)
CSN: [id number]     Arrival date & time 03/10/14  1840 History   First MD Initiated Contact with Patient 03/10/14 1911     Chief Complaint  Patient presents with  . Hand Pain     (Consider location/radiation/quality/duration/timing/severity/associated sxs/prior Treatment) HPI  This is a 28 year old male with a history of gout, cellulitis, morbid obesity who presents with right hand pain. Patient reports a one to 2 day history of increasing redness and pain over the right fourth and fits digits. Initially he thought it was gout but noticed redness and has had a history of cellulitis. He denies any injuries. She reports chills without documented fevers.  He is right-hand dominant. Pain is currently 10 out of 10. Pain is worse with range of motion.  Past Medical History  Diagnosis Date  . Gout   . Cellulitis   . Obesity   . Erosive esophagitis 09/01/2012    NSAID induced.  . Gastric ulcer with hemorrhage 09/02/2012    s/p bleeding control tx. Per Dr. Jena Gaussourk  . Acute blood loss anemia 09/02/2012    S/p 1 unit rbcs  . UTI (lower urinary tract infection)   . Gall stones    Past Surgical History  Procedure Laterality Date  . Tonsillectomy    . Esophagogastroduodenoscopy Left 09/01/2012    WUJ:WJXBJYRMR:Distal esophageal erosions consistent with erosive reflux esophagitis/Pre-pyloric benign-appearing gastric ulcer with bleeding stigmata status post bleeding control therapy as described above  . Esophagogastroduodenoscopy N/A 12/13/2012    Procedure: ESOPHAGOGASTRODUODENOSCOPY (EGD);  Surgeon: Corbin Adeobert M Rourk, MD;  [facility];  Service: Endoscopy;  Laterality: N/A;  11:30   Family History  Problem Relation Age of Onset  . Heart failure Mother    History  Substance Use Topics  . Smoking status: Light Tobacco Smoker -- 0.25 packs/day for 7 years    Types: Cigarettes  . Smokeless tobacco: Current User    Types: Snuff  . Alcohol Use: No    Review of Systems  Constitutional: Positive  for chills. Negative for fever.  Respiratory: Negative.  Negative for chest tightness and shortness of breath.   Cardiovascular: Negative.  Negative for chest pain.  Gastrointestinal: Negative.  Negative for abdominal pain.  Genitourinary: Negative.   Musculoskeletal: Negative for back pain.       Hand pain  Skin: Positive for color change.  All other systems reviewed and are negative.     Allergies  Asa; Nsaids; Penicillins; and Sulfa antibiotics  Home Medications   Prior to Admission medications   Medication Sig Start Date End Date Taking? Authorizing Provider  allopurinol (ZYLOPRIM) 300 MG tablet Take 300 mg by mouth daily.   Yes Historical Provider, MD  gabapentin (NEURONTIN) 300 MG capsule Take 300 mg by mouth 4 (four) times daily.    Yes Historical Provider, MD  albuterol (PROVENTIL HFA;VENTOLIN HFA) 108 (90 BASE) MCG/ACT inhaler Inhale 2 puffs into the lungs every 6 (six) hours as needed for wheezing or shortness of breath. 02/26/14   Glynn OctaveStephen Rancour, MD  colchicine 0.6 MG tablet Take 1 tablet (0.6 mg total) by mouth daily. Patient not taking: Reported on 02/26/2014 01/08/14   Flint MelterElliott L Wentz, MD  doxycycline (VIBRAMYCIN) 100 MG capsule Take 1 capsule (100 mg total) by mouth 2 (two) times daily. Patient not taking: Reported on 03/10/2014 02/26/14   Glynn OctaveStephen Rancour, MD  mometasone (NASONEX) 50 MCG/ACT nasal spray Place 2 sprays into the nose daily. 02/26/14 03/10/14  Glynn OctaveStephen Rancour, MD  oxyCODONE-acetaminophen (PERCOCET) 5-325 MG  per tablet Take 1-2 tablets by mouth every 4 (four) hours as needed for severe pain. Patient not taking: Reported on 02/26/2014 02/13/14   [name], MD  pantoprazole (PROTONIX) 40 MG tablet Take 1 tablet (40 mg total) by mouth daily. Patient not taking: Reported on 02/26/2014 02/11/14   Rhetta Mura, MD  traMADol (ULTRAM) 50 MG tablet Take 1 tablet (50 mg total) by mouth every 6 (six) hours as needed. Patient not taking: Reported on  02/26/2014 01/22/14   Kristen N Ward, DO   BP 128/85 mmHg  Pulse 109  Temp(Src) 98.3 F (36.8 C) (Oral)  Resp 20  Ht 5\' 7"  (1.702 m)  Wt 407 lb (184.614 kg)  BMI 63.73 kg/m2  SpO2 94% Physical Exam  Constitutional: He is oriented to person, place, and time.  Morbidly obese  HENT:  Head: Normocephalic and atraumatic.  Cardiovascular: Normal rate and regular rhythm.   Pulmonary/Chest: Effort normal. No respiratory distress.  Musculoskeletal: He exhibits no edema.  Focused examination of the right hand reveals redness and erythema over the proximal aspect of the right fourth and fifth digits extending from the PIP joint over the medical carpal joint, redness is both on the palmar and dorsal aspect of the hand, there is pain with range of motion at the PIP and MCP joints, fingers are held in a slightly flexed position and there is diffuse swelling, pain with extension  Neurological: He is alert and oriented to person, place, and time.  Skin: Skin is warm and dry.  Psychiatric: He has a normal mood and affect.  Nursing note and vitals reviewed.         ED Course  Procedures (including critical care time) Labs Review Labs Reviewed  CBC WITH DIFFERENTIAL - Abnormal; Notable for the following:    WBC 13.2 (*)    Neutro Abs 10.1 (*)    Monocytes Absolute 1.1 (*)    All other components within normal limits  BASIC METABOLIC PANEL - Abnormal; Notable for the following:    Glucose, Bld 114 (*)    All other components within normal limits  SEDIMENTATION RATE - Abnormal; Notable for the following:    Sed Rate 39 (*)    All other components within normal limits    Imaging Review Dg Hand Complete Right  03/10/2014   CLINICAL DATA:  Right hand pain, no known injury, initial encounter  EXAM: RIGHT HAND - COMPLETE 3+ VIEW  COMPARISON:  02/17/2007  FINDINGS: Significant degenerative changes are noted in the fifth proximal interphalangeal joint which have increased in the interval from  the prior exam. No acute fracture or dislocation is noted. Generalized soft tissue swelling is noted as well.  IMPRESSION: Degenerative change in the fifth digit as described. No acute bony abnormality is seen. Generalized soft tissue swelling is noted.   Electronically Signed   By: Alcide Clever M.D.   On: 03/10/2014 20:57     EKG Interpretation None      MDM   Final diagnoses:  Pain  Hand pain  Cellulitis of hand    Patient presents with pain, redness, and swelling to the right hand. Physical exam is concerning for cellulitis versus tenosynovitis; however, redness and tenderness to extend to the dorsum of the hand which would be somewhat atypical for tenosynovitis. Patient given pain medication and IV clindamycin. He is afebrile. Leukocytosis to 13.2. Otherwise lab work reassuring. Sedimentation rate is 39. X-rays show degenerative changes of the fifth digit which have increased from prior  exam otherwise unremarkable and no evidence of osteomyelitis. Given concern for possible tenosynovitis, discussed initially with [facility] Amanda PeaGramig, at [facility]. At this time he is unable to evaluate the patient given his workload this evening and has deferred to [facility]. Discussed with Dr. Romeo AppleHarrison who will evaluate the patient. He agrees with admission and IV antibiotics at this time.  Discussed with Dr. Sharl MaLama for admission.     Shon Batonourtney F Kariyah Baugh, MD 03/10/14 [phone]

## 2014-03-11 ENCOUNTER — Inpatient Hospital Stay (HOSPITAL_COMMUNITY): Payer: Medicaid Other

## 2014-03-11 DIAGNOSIS — L03119 Cellulitis of unspecified part of limb: Secondary | ICD-10-CM

## 2014-03-11 DIAGNOSIS — M659 Synovitis and tenosynovitis, unspecified: Secondary | ICD-10-CM | POA: Diagnosis present

## 2014-03-11 LAB — COMPREHENSIVE METABOLIC PANEL
ALBUMIN: 3.4 g/dL — AB (ref 3.5–5.2)
ALT: 16 U/L (ref 0–53)
AST: 11 U/L (ref 0–37)
Alkaline Phosphatase: 70 U/L (ref 39–117)
Anion gap: 11 (ref 5–15)
BUN: 8 mg/dL (ref 6–23)
CO2: 30 meq/L (ref 19–32)
CREATININE: 0.8 mg/dL (ref 0.50–1.35)
Calcium: 8.9 mg/dL (ref 8.4–10.5)
Chloride: 97 mEq/L (ref 96–112)
GFR calc Af Amer: >90 mL/min (ref >90)
Glucose, Bld: 131 mg/dL — ABNORMAL HIGH (ref 70–99)
Potassium: 3.7 mEq/L (ref 3.7–5.3)
Sodium: 138 mEq/L (ref 137–147)
TOTAL PROTEIN: 7.5 g/dL (ref 6.0–8.3)
Total Bilirubin: 0.6 mg/dL (ref 0.3–1.2)

## 2014-03-11 LAB — CBC
HEMATOCRIT: 39.5 % (ref 39.0–52.0)
HEMOGLOBIN: 13.1 g/dL (ref 13.0–17.0)
MCH: 29.2 pg (ref 26.0–34.0)
MCHC: 33.2 g/dL (ref 30.0–36.0)
MCV: 88 fL (ref 78.0–100.0)
PLATELETS: 258 10*3/uL (ref 150–400)
RBC: 4.49 MIL/uL (ref 4.22–5.81)
RDW: 13.9 % (ref 11.5–15.5)
WBC: 12.9 10*3/uL — AB (ref 4.0–10.5)

## 2014-03-11 LAB — URIC ACID: Uric Acid, Serum: 7.2 mg/dL (ref 4.0–7.8)

## 2014-03-11 MED ORDER — ACETAMINOPHEN 650 MG RE SUPP: 650.0000 mg | Freq: Four times a day (QID) | RECTAL | Status: DC | PRN

## 2014-03-11 MED ORDER — ACETAMINOPHEN 325 MG PO TABS
650.0000 mg | ORAL_TABLET | Freq: Four times a day (QID) | ORAL | Status: DC | PRN
  Administered 2014-03-12: 650 mg via ORAL
  Filled 2014-03-11: qty 2

## 2014-03-11 MED ORDER — CLINDAMYCIN PHOSPHATE 600 MG/50ML IV SOLN
INTRAVENOUS | Status: AC
  Filled 2014-03-11: qty 50

## 2014-03-11 MED ORDER — COLCHICINE 0.6 MG PO TABS
0.6000 mg | ORAL_TABLET | Freq: Every day | ORAL | Status: DC
  Administered 2014-03-11 – 2014-03-16 (×6): 0.6 mg via ORAL
  Filled 2014-03-11 (×8): qty 1

## 2014-03-11 MED ORDER — ENOXAPARIN SODIUM 100 MG/ML ~~LOC~~ SOLN
90.0000 mg | SUBCUTANEOUS | Status: DC
  Administered 2014-03-11 – 2014-03-16 (×6): 90 mg via SUBCUTANEOUS
  Filled 2014-03-11 (×8): qty 1

## 2014-03-11 MED ORDER — HYDROMORPHONE HCL 1 MG/ML IJ SOLN
2.0000 mg | INTRAMUSCULAR | Status: DC | PRN
Start: 2014-03-11 — End: 2014-03-13
  Administered 2014-03-11 – 2014-03-13 (×12): 2 mg via INTRAVENOUS
  Filled 2014-03-11 (×14): qty 2

## 2014-03-11 MED ORDER — SODIUM CHLORIDE 0.9 % IV SOLN
250.0000 mL | INTRAVENOUS | Status: DC | PRN
  Administered 2014-03-15: 250 mL via INTRAVENOUS

## 2014-03-11 MED ORDER — ALLOPURINOL 300 MG PO TABS
300.0000 mg | ORAL_TABLET | Freq: Every day | ORAL | Status: DC
  Administered 2014-03-11 – 2014-03-16 (×6): 300 mg via ORAL
  Filled 2014-03-11 (×7): qty 1

## 2014-03-11 MED ORDER — ALBUTEROL SULFATE (2.5 MG/3ML) 0.083% IN NEBU: 3.0000 mL | INHALATION_SOLUTION | Freq: Four times a day (QID) | RESPIRATORY_TRACT | Status: DC | PRN

## 2014-03-11 MED ORDER — ONDANSETRON HCL 4 MG/2ML IJ SOLN
4.0000 mg | Freq: Four times a day (QID) | INTRAMUSCULAR | Status: DC | PRN
  Administered 2014-03-11 – 2014-03-14 (×4): 4 mg via INTRAVENOUS
  Filled 2014-03-11 (×4): qty 2

## 2014-03-11 MED ORDER — CLINDAMYCIN PHOSPHATE 600 MG/50ML IV SOLN
600.0000 mg | Freq: Three times a day (TID) | INTRAVENOUS | Status: DC
  Administered 2014-03-11 – 2014-03-13 (×7): 600 mg via INTRAVENOUS
  Filled 2014-03-11 (×13): qty 50

## 2014-03-11 MED ORDER — PANTOPRAZOLE SODIUM 40 MG PO TBEC
40.0000 mg | DELAYED_RELEASE_TABLET | Freq: Every day | ORAL | Status: DC
  Administered 2014-03-11 – 2014-03-12 (×2): 40 mg via ORAL
  Filled 2014-03-11: qty 1

## 2014-03-11 MED ORDER — SODIUM CHLORIDE 0.9 % IJ SOLN
3.0000 mL | Freq: Two times a day (BID) | INTRAMUSCULAR | Status: DC
  Administered 2014-03-11 – 2014-03-16 (×9): 3 mL via INTRAVENOUS

## 2014-03-11 MED ORDER — GABAPENTIN 300 MG PO CAPS
300.0000 mg | ORAL_CAPSULE | Freq: Four times a day (QID) | ORAL | Status: DC
  Administered 2014-03-11 – 2014-03-16 (×22): 300 mg via ORAL
  Filled 2014-03-11 (×27): qty 1

## 2014-03-11 MED ORDER — ONDANSETRON HCL 4 MG PO TABS: 4.0000 mg | ORAL_TABLET | Freq: Four times a day (QID) | ORAL | Status: DC | PRN

## 2014-03-11 MED ORDER — SODIUM CHLORIDE 0.9 % IJ SOLN: 3.0000 mL | INTRAMUSCULAR | Status: DC | PRN

## 2014-03-11 MED ORDER — SODIUM CHLORIDE 0.9 % IV SOLN
INTRAVENOUS | Status: DC
  Administered 2014-03-11 (×3): via INTRAVENOUS

## 2014-03-11 NOTE — Progress Notes (Signed)
Patient transported by [facility].  Vitals stable and patient medicated for pain prior to transfer.  [facility].

## 2014-03-11 NOTE — Care Management Note (Addendum)
    Page 1 of 1   03/16/2014     10:24:55 AM CARE MANAGEMENT NOTE 03/16/2014  Patient:  [name],[name]   Account Number:  [id number]  Date Initiated:  03/11/2014  Documentation initiated by:  Sharrie RothmanBLACKWELL,TAMMY C  Subjective/Objective Assessment:   Pt admitted from home with hand cellulitis vs tenosynvitis. Pt lives alone and will return home at discharge. Pt is as independent as he is able. Pt has a cane and w/c from home use.     Action/Plan:   [facility]. CM on receiving unit to follow for discharge planning needs.   Anticipated DC Date:  03/16/2014   Anticipated DC Plan:  HOME/SELF CARE      DC Planning Services  CM consult      Choice offered to / List presented to:             Status of service:  Completed, signed off Medicare Important Message given?   (If response is "NO", the following Medicare IM given date fields will be blank) Date Medicare IM given:   Medicare IM given by:   Date Additional Medicare IM given:   Additional Medicare IM given by:    Discharge Disposition:  ACUTE TO ACUTE TRANS  Per UR Regulation:    If discussed at Long Length of Stay Meetings, dates discussed:    Comments:  03/11/14 1125 Arlyss Queenammy Blackwell, RN BSN CM   03-16-14 Patient requesting wheelchair , told OT that sometimes at home when his gout "is bad" he crawls around . At present patient is ambulatory . Attending MD aware and advised patient to go through his PCP for wheelchair. Ronny FlurryHeather Latissa Frick RN BSN [phone]

## 2014-03-11 NOTE — Progress Notes (Signed)
Patient seen and evaluated by my associate earlier this AM.  [facility] by orthopaedic hand specialist.  Will set up transfer and discuss with surgeon at [facility].  [name], [employer]

## 2014-03-11 NOTE — Consult Note (Signed)
Reason for Consult: Right hand cellulitis versus tenosynovitis Referring Physician: Dr. Tempie Donning is an 28 y.o. male.  HPI: 28 year old male presented to the emergency room with 3-4 day history of pain swelling redness of his right small finger progressing to his right ring finger. The patient has been on clindamycin now for approximately 12 hours. He still complaining of severe pain. He also complains of lack of motion and pain in the hyperthenar area and wrist. He has a history of gout. White count 13 sedimentation rate reportedly was 39. X-rays do not show any acute fracture or osteomyelitis.    Past Medical History  Diagnosis Date  . Gout   . Cellulitis   . Obesity   . Erosive esophagitis 09/01/2012    NSAID induced.  . Gastric ulcer with hemorrhage 09/02/2012    s/p bleeding control tx. Per Dr. Gala Romney  . Acute blood loss anemia 09/02/2012    S/p 1 unit rbcs  . UTI (lower urinary tract infection)   . Gall stones     Past Surgical History  Procedure Laterality Date  . Tonsillectomy    . Esophagogastroduodenoscopy Left 09/01/2012    BZM:CEYEMV esophageal erosions consistent with erosive reflux esophagitis/Pre-pyloric benign-appearing gastric ulcer with bleeding stigmata status post bleeding control therapy as described above  . Esophagogastroduodenoscopy N/A 12/13/2012    Procedure: ESOPHAGOGASTRODUODENOSCOPY (EGD);  Surgeon: Daneil Dolin, MD;  Location: [facility];  Service: Endoscopy;  Laterality: N/A;  11:30    Family History  Problem Relation Age of Onset  . Heart failure Mother     Social History:  reports that he has been smoking Cigarettes.  He has a 1.75 pack-year smoking history. His smokeless tobacco use includes Snuff. He reports that he does not drink alcohol or use illicit drugs.  Allergies:  Allergies  Allergen Reactions  . Asa [Aspirin] Other (See Comments)    Pt doesn't know reaction.  . Nsaids Other (See Comments)    Causes Ulcers   .  Penicillins Other (See Comments)    Pt doesn't know reaction.  . Sulfa Antibiotics Other (See Comments)    Pt doesn't know reaction.    Medications: I have reviewed the patient's current medications.  Results for orders placed or performed during the hospital encounter of 03/10/14 (from the past 48 hour(s))  CBC with Differential     Status: Abnormal   Collection Time: 03/10/14  7:51 PM  Result Value Ref Range   WBC 13.2 (H) 4.0 - 10.5 K/uL   RBC 4.89 4.22 - 5.81 MIL/uL   Hemoglobin 14.1 13.0 - 17.0 g/dL   HCT 42.4 39.0 - 52.0 %   MCV 86.7 78.0 - 100.0 fL   MCH 28.8 26.0 - 34.0 pg   MCHC 33.3 30.0 - 36.0 g/dL   RDW 13.9 11.5 - 15.5 %   Platelets 278 150 - 400 K/uL   Neutrophils Relative % 77 43 - 77 %   Neutro Abs 10.1 (H) 1.7 - 7.7 K/uL   Lymphocytes Relative 13 12 - 46 %   Lymphs Abs 1.7 0.7 - 4.0 K/uL   Monocytes Relative 8 3 - 12 %   Monocytes Absolute 1.1 (H) 0.1 - 1.0 K/uL   Eosinophils Relative 2 0 - 5 %   Eosinophils Absolute 0.2 0.0 - 0.7 K/uL   Basophils Relative 0 0 - 1 %   Basophils Absolute 0.0 0.0 - 0.1 K/uL  Basic metabolic panel     Status: Abnormal  Collection Time: 03/10/14  7:51 PM  Result Value Ref Range   Sodium 138 137 - 147 mEq/L   Potassium 4.0 3.7 - 5.3 mEq/L   Chloride 98 96 - 112 mEq/L   CO2 26 19 - 32 mEq/L   Glucose, Bld 114 (H) 70 - 99 mg/dL   BUN 8 6 - 23 mg/dL   Creatinine, Ser 0.64 0.50 - 1.35 mg/dL   Calcium 9.0 8.4 - 10.5 mg/dL   GFR calc non Af Amer >90 >90 mL/min   GFR calc Af Amer >90 >90 mL/min    Comment: (NOTE) The eGFR has been calculated using the CKD EPI equation. This calculation has not been validated in all clinical situations. eGFR's persistently <90 mL/min signify possible Chronic Kidney Disease.    Anion gap 14 5 - 15  Sedimentation rate     Status: Abnormal   Collection Time: 03/10/14  7:51 PM  Result Value Ref Range   Sed Rate 39 (H) 0 - 16 mm/hr  Comprehensive metabolic panel     Status: Abnormal    Collection Time: 03/11/14  5:35 AM  Result Value Ref Range   Sodium 138 137 - 147 mEq/L   Potassium 3.7 3.7 - 5.3 mEq/L   Chloride 97 96 - 112 mEq/L   CO2 30 19 - 32 mEq/L   Glucose, Bld 131 (H) 70 - 99 mg/dL   BUN 8 6 - 23 mg/dL   Creatinine, Ser 0.80 0.50 - 1.35 mg/dL   Calcium 8.9 8.4 - 10.5 mg/dL   Total Protein 7.5 6.0 - 8.3 g/dL   Albumin 3.4 (L) 3.5 - 5.2 g/dL   AST 11 0 - 37 U/L   ALT 16 0 - 53 U/L   Alkaline Phosphatase 70 39 - 117 U/L   Total Bilirubin 0.6 0.3 - 1.2 mg/dL   GFR calc non Af Amer >90 >90 mL/min   GFR calc Af Amer >90 >90 mL/min    Comment: (NOTE) The eGFR has been calculated using the CKD EPI equation. This calculation has not been validated in all clinical situations. eGFR's persistently <90 mL/min signify possible Chronic Kidney Disease.    Anion gap 11 5 - 15  CBC     Status: Abnormal   Collection Time: 03/11/14  5:35 AM  Result Value Ref Range   WBC 12.9 (H) 4.0 - 10.5 K/uL   RBC 4.49 4.22 - 5.81 MIL/uL   Hemoglobin 13.1 13.0 - 17.0 g/dL   HCT 39.5 39.0 - 52.0 %   MCV 88.0 78.0 - 100.0 fL   MCH 29.2 26.0 - 34.0 pg   MCHC 33.2 30.0 - 36.0 g/dL   RDW 13.9 11.5 - 15.5 %   Platelets 258 150 - 400 K/uL    Dg Chest 2 View  03/11/2014   CLINICAL DATA:  Dyspnea  EXAM: CHEST  2 VIEW  COMPARISON:  02/26/2014  FINDINGS: No cardiomegaly. The negative aortic and hilar contours. Low lung volumes with interstitial crowding. There is no edema, consolidation, effusion, or pneumothorax.  IMPRESSION: No active cardiopulmonary disease.   Electronically Signed   By: Jorje Guild M.D.   On: 03/11/2014 06:24   Dg Hand Complete Right  03/10/2014   CLINICAL DATA:  Right hand pain, no known injury, initial encounter  EXAM: RIGHT HAND - COMPLETE 3+ VIEW  COMPARISON:  02/17/2007  FINDINGS: Significant degenerative changes are noted in the fifth proximal interphalangeal joint which have increased in the interval from the prior exam. No acute  fracture or dislocation is  noted. Generalized soft tissue swelling is noted as well.  IMPRESSION: Degenerative change in the fifth digit as described. No acute bony abnormality is seen. Generalized soft tissue swelling is noted.   Electronically Signed   By: Inez Catalina M.D.   On: 03/10/2014 20:57    Review of Systems  Constitutional: Positive for fever, chills and malaise/fatigue.  All other systems reviewed and are negative.  Blood pressure 129/70, pulse 91, temperature 98.5 F (36.9 C), temperature source Oral, resp. rate 20, height 5' 7"  (1.702 m), weight 404 lb 8.7 oz (183.5 kg), SpO2 96 %. Physical Exam  Nursing note and vitals reviewed.  Physical Exam  Nursing note and vitals reviewed. Gen: severe morbid obesity, awake alert and oriented 3 mood and affect normal. Lying in bed with right hand on pillows elevated  Swelling is noted from the DIP joint proximally to the metacarpophalangeal joint area dorsally and volarly with tenderness in both areas. Finger held in slight flexion. Redness also tracks over to the ring finger more dorsal than volar with tenderness and swelling as well. There is significant erythema. There is tenderness and pain with the wrist joint motion. Perfusion to the digit is normal there are no blisters. Sensory exam remains intact.   Assessment/Plan: Cellulitis vs tenosynovitis right hand   Again recommend evaluation by hand surgeon   Uric acid ordered   Arther Abbott 03/11/2014, 8:57 AM

## 2014-03-11 NOTE — Progress Notes (Signed)
UR chart review completed.  

## 2014-03-11 NOTE — Consult Note (Signed)
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 WOULD RECOMMEND MRI STAT OF HAND TO ASSESS FLUID IN JOINT VERSUS TENDON SHEATH. DOES NOT APPEAR TO BE FLEXOR TENOSYONVITIS. HAVE COORDINATED CARE WITH MEDICINE KEEP NPO UNTIL AFTER MRI WILL CONTINUE TO FOLLOW

## 2014-03-11 NOTE — H&P (Signed)
PCP:   No primary care provider on file.   Chief Complaint:  Hand pain  HPI:  28 year old male who  has a past medical history of Gout; Cellulitis; Obesity; Erosive esophagitis (09/01/2012); Gastric ulcer with hemorrhage (09/02/2012); Acute blood loss anemia (09/02/2012); UTI (lower urinary tract infection); and Gall stones. Today presents to the ED with chief complaint of two-day history of increasing pain and redness over the right fourth and fifth digits. Patient able to move the hand due to pain. And rates pain as 10/10 in intensity with worse on movement. Hand surgeon at [facility] was called by the ED physician as there was a concern for cellulitis versus tenosynovitis. Hands surgeon told that he was unable to value at the patient due to the workload and deferred to orthopedics at [facility]. Dr. Wilkie AyeHorton discussed with Dr. Romeo AppleHarrison and he reviewed the films, and recommended IV antibiotics for tonight with evaluation by hand surgeon in a.m.  Allergies:   Allergies  Allergen Reactions  . Asa [Aspirin] Other (See Comments)    Pt doesn't know reaction.  . Nsaids Other (See Comments)    Causes Ulcers   . Penicillins Other (See Comments)    Pt doesn't know reaction.  . Sulfa Antibiotics Other (See Comments)    Pt doesn't know reaction.      Past Medical History  Diagnosis Date  . Gout   . Cellulitis   . Obesity   . Erosive esophagitis 09/01/2012    NSAID induced.  . Gastric ulcer with hemorrhage 09/02/2012    s/p bleeding control tx. Per Dr. Jena Gaussourk  . Acute blood loss anemia 09/02/2012    S/p 1 unit rbcs  . UTI (lower urinary tract infection)   . Gall stones     Past Surgical History  Procedure Laterality Date  . Tonsillectomy    . Esophagogastroduodenoscopy Left 09/01/2012    ZOX:[id number]:Distal esophageal erosions consistent with erosive reflux esophagitis/Pre-pyloric benign-appearing gastric ulcer with bleeding stigmata status post bleeding control therapy as described above  .  Esophagogastroduodenoscopy N/A 12/13/2012    Procedure: ESOPHAGOGASTRODUODENOSCOPY (EGD);  Surgeon: Corbin Adeobert M Rourk, MD;  Location: [facility];  Service: Endoscopy;  Laterality: N/A;  11:30    Prior to Admission medications   Medication Sig Start Date End Date Taking? Authorizing Provider  allopurinol (ZYLOPRIM) 300 MG tablet Take 300 mg by mouth daily.   Yes Historical Provider, MD  gabapentin (NEURONTIN) 300 MG capsule Take 300 mg by mouth 4 (four) times daily.    Yes Historical Provider, MD  albuterol (PROVENTIL HFA;VENTOLIN HFA) 108 (90 BASE) MCG/ACT inhaler Inhale 2 puffs into the lungs every 6 (six) hours as needed for wheezing or shortness of breath. 02/26/14   Glynn OctaveStephen Rancour, MD  colchicine 0.6 MG tablet Take 1 tablet (0.6 mg total) by mouth daily. Patient not taking: Reported on 02/26/2014 01/08/14   Flint MelterElliott L Wentz, MD  doxycycline (VIBRAMYCIN) 100 MG capsule Take 1 capsule (100 mg total) by mouth 2 (two) times daily. Patient not taking: Reported on 03/10/2014 02/26/14   Glynn OctaveStephen Rancour, MD  mometasone (NASONEX) 50 MCG/ACT nasal spray Place 2 sprays into the nose daily. 02/26/14 03/10/14  Glynn OctaveStephen Rancour, MD  oxyCODONE-acetaminophen (PERCOCET) 5-325 MG per tablet Take 1-2 tablets by mouth every 4 (four) hours as needed for severe pain. Patient not taking: Reported on 02/26/2014 02/13/14   Loren Raceravid Yelverton, MD  pantoprazole (PROTONIX) 40 MG tablet Take 1 tablet (40 mg total) by mouth daily. Patient not taking: Reported on 02/26/2014  02/11/14   Rhetta MuraJai-Gurmukh Samtani, MD  traMADol (ULTRAM) 50 MG tablet Take 1 tablet (50 mg total) by mouth every 6 (six) hours as needed. Patient not taking: Reported on 02/26/2014 01/22/14   Layla MawKristen N Ward, DO    Social History:  reports that he has been smoking Cigarettes.  He has a 1.75 pack-year smoking history. His smokeless tobacco use includes Snuff. He reports that he does not drink alcohol or use illicit drugs.  Family History  Problem Relation Age  of Onset  . Heart failure Mother      All the positives are listed in BOLD  Review of Systems:  HEENT: Headache, blurred vision, runny nose, sore throat Neck: Hypothyroidism, hyperthyroidism,,lymphadenopathy Chest : Shortness of breath, history of COPD, Asthma Heart : Chest pain, history of coronary arterey disease GI:  Nausea, vomiting, diarrhea, constipation, GERD GU: Dysuria, urgency, frequency of urination, hematuria Neuro: Stroke, seizures, syncope Psych: Depression, anxiety, hallucinations   Physical Exam: Blood pressure 136/68, pulse 100, temperature 98.5 F (36.9 C), temperature source Oral, resp. rate 20, height 5\' 7"  (1.702 m), weight 183.5 kg (404 lb 8.7 oz), SpO2 96 %. Constitutional:   Patient is a morbidly obese male in no acute distress and cooperative with exam. Head: Normocephalic and atraumatic Mouth: Mucus membranes moist Eyes: PERRL, EOMI, conjunctivae normal Neck: Supple, No Thyromegaly Cardiovascular: RRR, S1 normal, S2 normal Pulmonary/Chest: CTAB, no wheezes, rales, or rhonchi Abdominal: Soft. Non-tender, non-distended, bowel sounds are normal, no masses, organomegaly, or guarding present.  Neurological: A&O x3, Strength is normal and symmetric bilaterally, cranial nerve II-XII are grossly intact, no focal motor deficit, sensory intact to light touch bilaterally.  Extremities : No Cyanosis, Clubbing or Edema Hand- patient has erythema noted on the dorsal aspect of fourth and fifth fingers, flexed position, tenderness on passive range of motion  Labs on Admission:  Basic Metabolic Panel:  Recent Labs Lab 03/10/14 1951  NA 138  K 4.0  CL 98  CO2 26  GLUCOSE 114*  BUN 8  CREATININE 0.64  CALCIUM 9.0   Liver Function Tests: No results for input(s): AST, ALT, ALKPHOS, BILITOT, PROT, ALBUMIN in the last 168 hours. No results for input(s): LIPASE, AMYLASE in the last 168 hours. No results for input(s): AMMONIA in the last 168 hours. CBC:  Recent  Labs Lab 03/10/14 1951  WBC 13.2*  NEUTROABS 10.1*  HGB 14.1  HCT 42.4  MCV 86.7  PLT 278   Cardiac Enzymes: No results for input(s): CKTOTAL, CKMB, CKMBINDEX, TROPONINI in the last 168 hours.  BNP (last 3 results)  Recent Labs  02/26/14 0750  PROBNP 24.4   CBG: No results for input(s): GLUCAP in the last 168 hours.  Radiological Exams on Admission: Dg Hand Complete Right  03/10/2014   CLINICAL DATA:  Right hand pain, no known injury, initial encounter  EXAM: RIGHT HAND - COMPLETE 3+ VIEW  COMPARISON:  02/17/2007  FINDINGS: Significant degenerative changes are noted in the fifth proximal interphalangeal joint which have increased in the interval from the prior exam. No acute fracture or dislocation is noted. Generalized soft tissue swelling is noted as well.  IMPRESSION: Degenerative change in the fifth digit as described. No acute bony abnormality is seen. Generalized soft tissue swelling is noted.   Electronically Signed   By: Alcide CleverMark  Lukens M.D.   On: 03/10/2014 20:57       Assessment/Plan Principal Problem:   Cellulitis of hand Active Problems:   Morbid obesity   Cellulitis  Cellulitis versus  tenosynovitis Will continue patient on clindamycin 600 mg IV 8 hours. Will give Dilaudid when necessary for pain Orthopedics has been consulted Patient will need to be evaluated by hand surgeon in a.m. as per orthopedics  Dyspnea Patient has episodic shortness of breath, especially on exertion. Though he denies shortness of breath at this time. Will obtain chest x-ray.  Diarrhea Patient has been having loose stools for past one month, the frequency ranging from 1-4 episodes everyday. Will check stool for GI pathogen panel.  DVT prophylaxis Lovenox  Code status: Patient is full code  Family discussion: No family at bedside.   Time Spent on Admission: 60 minutes  Faduma Cho S [facility] Pager: [phone] 03/11/2014, 1:24 AM  If 7PM-7AM, please contact  night-coverage  [email or web address]  Password [email or web address]

## 2014-03-12 ENCOUNTER — Inpatient Hospital Stay (HOSPITAL_COMMUNITY): Payer: Medicaid Other

## 2014-03-12 ENCOUNTER — Encounter (HOSPITAL_COMMUNITY): Payer: Self-pay | Admitting: Radiology

## 2014-03-12 DIAGNOSIS — L03113 Cellulitis of right upper limb: Secondary | ICD-10-CM

## 2014-03-12 DIAGNOSIS — M659 Synovitis and tenosynovitis, unspecified: Secondary | ICD-10-CM

## 2014-03-12 MED ORDER — METHYLPREDNISOLONE SODIUM SUCC 125 MG IJ SOLR
80.0000 mg | Freq: Three times a day (TID) | INTRAMUSCULAR | Status: DC
  Administered 2014-03-12 – 2014-03-15 (×9): 80 mg via INTRAVENOUS
  Filled 2014-03-12 (×10): qty 1.28
  Filled 2014-03-12: qty 2
  Filled 2014-03-12 (×2): qty 1.28
  Filled 2014-03-12: qty 2

## 2014-03-12 MED ORDER — VANCOMYCIN HCL 10 G IV SOLR
1500.0000 mg | Freq: Two times a day (BID) | INTRAVENOUS | Status: DC
  Administered 2014-03-13 – 2014-03-16 (×7): 1500 mg via INTRAVENOUS
  Filled 2014-03-12 (×9): qty 1500

## 2014-03-12 MED ORDER — INDOMETHACIN ER 75 MG PO CPCR
75.0000 mg | ORAL_CAPSULE | Freq: Two times a day (BID) | ORAL | Status: DC
  Administered 2014-03-12 – 2014-03-16 (×9): 75 mg via ORAL
  Filled 2014-03-12 (×11): qty 1

## 2014-03-12 MED ORDER — PANTOPRAZOLE SODIUM 40 MG PO TBEC
40.0000 mg | DELAYED_RELEASE_TABLET | Freq: Two times a day (BID) | ORAL | Status: DC
  Administered 2014-03-12 – 2014-03-16 (×8): 40 mg via ORAL
  Filled 2014-03-12 (×8): qty 1

## 2014-03-12 MED ORDER — OXYCODONE-ACETAMINOPHEN 5-325 MG PO TABS
1.0000 | ORAL_TABLET | ORAL | Status: DC | PRN
  Administered 2014-03-12 – 2014-03-14 (×8): 2 via ORAL
  Filled 2014-03-12 (×8): qty 2

## 2014-03-12 MED ORDER — IOHEXOL 300 MG/ML  SOLN
100.0000 mL | Freq: Once | INTRAMUSCULAR | Status: AC | PRN
  Administered 2014-03-12: 100 mL via INTRAVENOUS

## 2014-03-12 MED ORDER — VANCOMYCIN HCL 10 G IV SOLR
2500.0000 mg | Freq: Three times a day (TID) | INTRAVENOUS | Status: AC
  Administered 2014-03-12 (×2): 2500 mg via INTRAVENOUS
  Filled 2014-03-12 (×2): qty 2500

## 2014-03-12 NOTE — Progress Notes (Signed)
ANTIBIOTIC CONSULT NOTE - INITIAL  Pharmacy Consult for Vancomycin Indication: cellulitis of hand  Allergies  Allergen Reactions  . Asa [Aspirin] Other (See Comments)    Pt doesn't know reaction.  . Nsaids Other (See Comments)    Causes Ulcers   . Penicillins Other (See Comments)    Pt doesn't know reaction.  . Sulfa Antibiotics Other (See Comments)    Pt doesn't know reaction.    Patient Measurements: Height: 5\' 7"  (170.2 cm) Weight: (!) 404 lb 8.7 oz (183.5 kg) IBW/kg (Calculated) : 66.1  Vital Signs: Temp: 100.2 F (37.9 C) (11/26 0528) BP: 114/52 mmHg (11/26 0528) Pulse Rate: 106 (11/26 0528) Intake/Output from previous day: 11/25 0701 - 11/26 0700 In: 1039 [I.V.:939; IV Piggyback:100] Out: 1150 [Urine:1150] Intake/Output from this shift: Total I/O In: -  Out: 550 [Urine:550]  Labs:  Recent Labs  03/10/14 1951 03/11/14 0535  WBC 13.2* 12.9*  HGB 14.1 13.1  PLT 278 258  CREATININE 0.64 0.80   Estimated Creatinine Clearance: 219.9 mL/min (by C-G formula based on Cr of 0.8). No results for input(s): VANCOTROUGH, VANCOPEAK, VANCORANDOM, GENTTROUGH, GENTPEAK, GENTRANDOM, TOBRATROUGH, TOBRAPEAK, TOBRARND, AMIKACINPEAK, AMIKACINTROU, AMIKACIN in the last 72 hours.   Microbiology: No results found for this or any previous visit (from the past 720 hour(s)).  Medical History: Past Medical History  Diagnosis Date  . Gout   . Cellulitis   . Obesity   . Erosive esophagitis 09/01/2012    NSAID induced.  . Gastric ulcer with hemorrhage 09/02/2012    s/p bleeding control tx. Per Dr. Jena Gaussourk  . Acute blood loss anemia 09/02/2012    S/p 1 unit rbcs  . UTI (lower urinary tract infection)   . Gall stones     Medications:  Prescriptions prior to admission  Medication Sig Dispense Refill Last Dose  . allopurinol (ZYLOPRIM) 300 MG tablet Take 300 mg by mouth daily.   03/10/2014 at Unknown time  . benazepril (LOTENSIN) 5 MG tablet Take 5 mg by mouth daily.   03/10/2014  at Unknown time  . gabapentin (NEURONTIN) 300 MG capsule Take 300 mg by mouth 3 (three) times daily.    03/10/2014 at Unknown time  . ranitidine (ZANTAC) 300 MG tablet Take 300 mg by mouth at bedtime.   03/10/2014 at Unknown time  . albuterol (PROVENTIL HFA;VENTOLIN HFA) 108 (90 BASE) MCG/ACT inhaler Inhale 2 puffs into the lungs every 6 (six) hours as needed for wheezing or shortness of breath. 1 Inhaler 2 prescription never filled   Assessment: 28 year old man with right hand cellulitis to start on vancomycin (in addition to clindamycin).  Renal function is normal for age.    Goal of Therapy:  Vancomycin trough level 10-15 mcg/ml  Plan:  Measure antibiotic drug levels at steady state Follow up culture results Vancomycin 2.5g IV q8h x 2 doses, then 1.5g IV q12 therafter  Follow renal function  Mickeal SkinnerFrens, Jaylei Fuerte John 03/12/2014,12:23 PM

## 2014-03-12 NOTE — Progress Notes (Signed)
PT SEEN/EXAMINED HAND STILL SWOLLEN FINGERS WARM WELL PERFUSED LIMITED MOTION OF FINGERS NO FOCAL AREAS OF ABSCESS COLLECTION MINIMAL TENDERNESS OVER FLEXOR SHEATHS CT SCAN REVIEWED AND IN CHART  DO NOT RECOMMEND SURGERY FOR HAND NO EVIDENCE OF PURULENT TENOSYNOVITIS OF ALL FINGERS NOR AREA OF ABSCESS LOOKS MORE INFLAMMATORY PROCESS RIGHT NOW WILL ASK ORTHO TECH TO IMMOBILIZE HAND CONTINUE IV ABX FOR CELLULIITS/ AND TREAT FOR GOUT  IF WORSENS PT WILL NEED AN MRI OF HIS HAND AND WOULD NEED TO GO TO CENTER THAT ACCOMODATES IMAGING PERSONS OF HIS SIZE, UNFORTUNATELY OUR SCANNER HERE HE DOES NOT FIT IN AND I HAVE SPOKEN TO RADIOLOGISTS ABOUT THIS LAST NIGHT AND HAVE COORDINATED HIS CARE WITH DR. DALEESSSIO IF QUESTIONS ARISE I CAN BE CONTACTED BY CELL PHONE [phone]

## 2014-03-12 NOTE — Progress Notes (Signed)
[facility] PROGRESS NOTE   Steven CoinsMichael E Gillentine ZOX:[id number] DOB: 06-10-1985 DOA: 03/10/2014 PCP: No primary care provider on file.  HPI/Subjective: Moderate to severe pain and tenderness in his right hand  Assessment/Plan: Principal Problem:   Cellulitis of hand Active Problems:   Morbid obesity   Cellulitis   Tenosynovitis of finger, hand, or wrist     Cellulitis of the right hand Patient reported swelling, redness and pain in the right hand. No much of improvement since yesterday. Had fever of 100.2 earlier today, per hem redness is worsening. Seen by Dr. Orlan Leavensrtman of hand surgery and recommended to treat as gouty arthritis. MRI ordered but patient couldn't fit in the scanner, CT scan done which showed no evidence of intra-articular infection. We'll continue treatment for gout, add NSAID and steroids. In the same time because of development of fever and worsening of the redness will broaden the antibiotics coverage. Plan is to continue treat for both acute gouty arthritis and cellulitis, if worsen might need to refer to a center which an open MRI can be done.  Sepsis Temperature of 100.2, HR off 106 and WBC of 13.2 and presence of Suspected cellulitis. Patient currently on clindamycin, still spiking fevers and will broaden antibiotics.  Morbid obesity Patient is 404 pounds, patient is super obese with BMI of around 63.5. Patient reported about 40 pound weight loss since last year.   Code Status: Full code Family Communication: Plan discussed with the patient. Disposition Plan: Remains inpatient   Consultants:  ortho  Procedures:  None  Antibiotics:   clindamycin   Start Zosyn and vancomycin   Objective: Filed Vitals:   03/12/14 0528  BP: 114/52  Pulse: 106  Temp: 100.2 F (37.9 C)  Resp: 20    Intake/Output Summary (Last 24 hours) at 03/12/14 1032 Last data filed at 03/12/14 1000  Gross per 24 hour  Intake   1039 ml  Output   1700 ml    Net   -661 ml   Filed Weights   03/10/14 1858 03/11/14 0100  Weight: 184.614 kg (407 lb) 183.5 kg (404 lb 8.7 oz)    Exam: General: Alert and awake, oriented x3, not in any acute distress. HEENT: anicteric sclera, pupils reactive to light and accommodation, EOMI CVS: S1-S2 clear, no murmur rubs or gallops Chest: clear to auscultation bilaterally, no wheezing, rales or rhonchi Abdomen: soft nontender, nondistended, normal bowel sounds, no organomegaly Extremities: no cyanosis, clubbing or edema noted bilaterally Neuro: Cranial nerves II-XII intact, no focal neurological deficits  Data Reviewed: Basic Metabolic Panel:  Recent Labs Lab 03/10/14 1951 03/11/14 0535  NA 138 138  K 4.0 3.7  CL 98 97  CO2 26 30  GLUCOSE 114* 131*  BUN 8 8  CREATININE 0.64 0.80  CALCIUM 9.0 8.9   Liver Function Tests:  Recent Labs Lab 03/11/14 0535  AST 11  ALT 16  ALKPHOS 70  BILITOT 0.6  PROT 7.5  ALBUMIN 3.4*   No results for input(s): LIPASE, AMYLASE in the last 168 hours. No results for input(s): AMMONIA in the last 168 hours. CBC:  Recent Labs Lab 03/10/14 1951 03/11/14 0535  WBC 13.2* 12.9*  NEUTROABS 10.1*  --   HGB 14.1 13.1  HCT 42.4 39.5  MCV 86.7 88.0  PLT 278 258   Cardiac Enzymes: No results for input(s): CKTOTAL, CKMB, CKMBINDEX, TROPONINI in the last 168 hours. BNP (last 3 results)  Recent Labs  02/26/14 0750  PROBNP 24.4   CBG: No results  for input(s): GLUCAP in the last 168 hours.  Micro No results found for this or any previous visit (from the past 240 hour(s)).   Studies: Dg Chest 2 View  03/11/2014   CLINICAL DATA:  Dyspnea  EXAM: CHEST  2 VIEW  COMPARISON:  02/26/2014  FINDINGS: No cardiomegaly. The negative aortic and hilar contours. Low lung volumes with interstitial crowding. There is no edema, consolidation, effusion, or pneumothorax.  IMPRESSION: No active cardiopulmonary disease.   Electronically Signed   By: Tiburcio PeaJonathan  Watts M.D.   On:  03/11/2014 06:24   Ct Hand Right W Wo Contrast  03/12/2014   CLINICAL DATA:  Pain and swelling right hand.  EXAM: CT OF THE RIGHT HAND WITHOUT AND WITH CONTRAST  TECHNIQUE: Multidetector CT imaging was performed following the standard protocol before and after bolus administration of intravenous contrast.  CONTRAST:  100 mL OMNIPAQUE IOHEXOL 300 MG/ML  SOLN  COMPARISON:  Plain films of the right little finger is 11/13/2004. Plain films right hand 02/17/2007 and 03/10/2014.  FINDINGS: The study is limited by the patient's size and difficulty with positioning. The patient was scanned with his hand over his abdomen due to limited mobility.  Soft tissues of the hand appear somewhat swollen but no focal fluid collection is identified. No evidence of tenosynovitis is identified. There is bony hypertrophy about the PIP joint of the little finger most consistent with osteoarthritis. This may be posttraumatic given remote history of trauma in 2006. The fifth MCP joint appears narrowed. Spurring off the radial aspect of the fifth metacarpal head is not well demonstrated on this examination. No definite erosion is seen. The appears to some joint space narrowing at the second MCP joint is well. No fracture or dislocation is identified.  IMPRESSION: Markedly limited study without evidence of tenosynovitis or abscess.  Degenerative change PIP joint right little finger may be posttraumatic.  Narrowing of the fifth MCP joint is better seen on prior plain films. Question prior trauma possibly gouty arthropathy.   Electronically Signed   By: Drusilla Kannerhomas  Dalessio M.D.   On: 03/12/2014 07:20   Dg Hand Complete Right  03/10/2014   CLINICAL DATA:  Right hand pain, no known injury, initial encounter  EXAM: RIGHT HAND - COMPLETE 3+ VIEW  COMPARISON:  02/17/2007  FINDINGS: Significant degenerative changes are noted in the fifth proximal interphalangeal joint which have increased in the interval from the prior exam. No acute fracture or  dislocation is noted. Generalized soft tissue swelling is noted as well.  IMPRESSION: Degenerative change in the fifth digit as described. No acute bony abnormality is seen. Generalized soft tissue swelling is noted.   Electronically Signed   By: Alcide CleverMark  Lukens M.D.   On: 03/10/2014 20:57    Scheduled Meds: . allopurinol  300 mg Oral Daily  . clindamycin (CLEOCIN) IV  600 mg Intravenous 3 times per day  . colchicine  0.6 mg Oral Daily  . enoxaparin (LOVENOX) injection  90 mg Subcutaneous Q24H  . gabapentin  300 mg Oral QID  . pantoprazole  40 mg Oral Daily  . sodium chloride  3 mL Intravenous Q12H   Continuous Infusions: . sodium chloride 75 mL/hr at 03/11/14 1611       Time spent: 35 minutes    [facility] - EdmondsELMAHI,Magda Muise A  [facility] Pager [phone] If 7PM-7AM, please contact night-coverage at [email or web address], password [email or web address] 03/12/2014, 10:32 AM  LOS: 2 days

## 2014-03-12 NOTE — Progress Notes (Signed)
Orthopedic Tech Progress Note Patient Details:  Steven CoinsMichael E Tandy [birth year] [id number] Modified volar splint applied to RUE. Volar splint extended up palm and through finger tips as order by doctor. Patient requested that finger tips not be completely splinted/coivered due to excessive sweating. Patient  Tolerated application well. Arm propped on pillows after splint application. Ortho Devices Type of Ortho Device: Ace wrap, Volar splint Ortho Device/Splint Location: RUE (modified) volar Ortho Device/Splint Interventions: Application   Asia R Thompson 03/12/2014, 11:18 AM

## 2014-03-13 LAB — BASIC METABOLIC PANEL
Anion gap: 11 (ref 5–15)
BUN: 16 mg/dL (ref 6–23)
CALCIUM: 9 mg/dL (ref 8.4–10.5)
CO2: 24 meq/L (ref 19–32)
Chloride: 99 mEq/L (ref 96–112)
Creatinine, Ser: 0.73 mg/dL (ref 0.50–1.35)
GFR calc Af Amer: >90 mL/min (ref >90)
GFR calc non Af Amer: >90 mL/min (ref >90)
GLUCOSE: 181 mg/dL — AB (ref 70–99)
Potassium: 4.8 mEq/L (ref 3.7–5.3)
SODIUM: 134 meq/L — AB (ref 137–147)

## 2014-03-13 MED ORDER — HYDROMORPHONE HCL 1 MG/ML IJ SOLN
1.0000 mg | INTRAMUSCULAR | Status: DC | PRN
  Administered 2014-03-13: 1 mg via INTRAVENOUS
  Administered 2014-03-13: 2 mg via INTRAVENOUS
  Administered 2014-03-14 – 2014-03-16 (×13): 1 mg via INTRAVENOUS
  Filled 2014-03-13 (×14): qty 1

## 2014-03-13 MED ORDER — FUROSEMIDE 10 MG/ML IJ SOLN
40.0000 mg | Freq: Once | INTRAMUSCULAR | Status: AC
  Administered 2014-03-13: 40 mg via INTRAVENOUS
  Filled 2014-03-13: qty 4

## 2014-03-13 MED ORDER — DIPHENHYDRAMINE HCL 50 MG/ML IJ SOLN
25.0000 mg | Freq: Four times a day (QID) | INTRAMUSCULAR | Status: DC | PRN
  Administered 2014-03-13 – 2014-03-16 (×7): 25 mg via INTRAVENOUS
  Filled 2014-03-13 (×7): qty 1

## 2014-03-13 MED ORDER — SACCHAROMYCES BOULARDII 250 MG PO CAPS
250.0000 mg | ORAL_CAPSULE | Freq: Two times a day (BID) | ORAL | Status: DC
  Administered 2014-03-13 – 2014-03-16 (×7): 250 mg via ORAL
  Filled 2014-03-13 (×9): qty 1

## 2014-03-13 NOTE — Progress Notes (Signed)
[facility] PROGRESS NOTE   [name] ZOX:[id number] DOB: Jun 21, 1985 DOA: 03/10/2014 PCP: No primary care provider on file.  HPI/Subjective: Continue to have pain in his right hand, improved since yesterday.  Assessment/Plan: Principal Problem:   Cellulitis of hand Active Problems:   Morbid obesity   Cellulitis   Tenosynovitis of finger, hand, or wrist     Cellulitis of the right hand Patient reported swelling, redness and pain in the right hand. No much of improvement since yesterday. Had fever of 100.2 earlier today, per hem redness is worsening. Seen by Dr. Orlan Leavensrtman of hand surgery and recommended to treat as gouty arthritis. MRI ordered but patient couldn't fit in the scanner, CT scan done which showed no evidence of intra-articular infection. We'll continue treatment for gout, add NSAID and steroids. In the same time because of development of fever and worsening of the redness will broaden the antibiotics coverage. Continue to treat for both acute gout and cellulitis  Sepsis Temperature of 100.2, HR off 106 and WBC of 13.2 and presence of Suspected cellulitis. Patient currently on clindamycin, still spiking fevers and will broaden antibiotics.  Morbid obesity Patient is 404 pounds, patient is super obese with BMI of around 63.5. Patient reported about 40 pound weight loss since last year.   Diarrhea Patient had 3 episodes of diarrhea since yesterday, patient is on clindamycin and vancomycin. Because of possibility of antibiotic related diarrhea will discontinue clindamycin. If diarrhea continues will check for C. Difficile.  Itching Patient complains about itching, said this is not new thing he had that at home. Less likely to be reaction to medications.  Code Status: Full code Family Communication: Plan discussed with the patient. Disposition Plan: Remains inpatient   Consultants:  ortho  Procedures:  None  Antibiotics:    clindamycin   Start Zosyn and vancomycin   Objective: Filed Vitals:   03/13/14 0450  BP: 129/80  Pulse: 72  Temp: 97.8 F (36.6 C)  Resp: 18    Intake/Output Summary (Last 24 hours) at 03/13/14 1212 Last data filed at 03/13/14 0537  Gross per 24 hour  Intake   2747 ml  Output    200 ml  Net   2547 ml   Filed Weights   03/10/14 1858 03/11/14 0100  Weight: 184.614 kg (407 lb) 183.5 kg (404 lb 8.7 oz)    Exam: General: Alert and awake, oriented x3, not in any acute distress. HEENT: anicteric sclera, pupils reactive to light and accommodation, EOMI CVS: S1-S2 clear, no murmur rubs or gallops Chest: clear to auscultation bilaterally, no wheezing, rales or rhonchi Abdomen: soft nontender, nondistended, normal bowel sounds, no organomegaly Extremities: no cyanosis, clubbing or edema noted bilaterally Neuro: Cranial nerves II-XII intact, no focal neurological deficits  Data Reviewed: Basic Metabolic Panel:  Recent Labs Lab 03/10/14 1951 03/11/14 0535 03/13/14 0331  NA 138 138 134*  K 4.0 3.7 4.8  CL 98 97 99  CO2 26 30 24   GLUCOSE 114* 131* 181*  BUN 8 8 16   CREATININE 0.64 0.80 0.73  CALCIUM 9.0 8.9 9.0   Liver Function Tests:  Recent Labs Lab 03/11/14 0535  AST 11  ALT 16  ALKPHOS 70  BILITOT 0.6  PROT 7.5  ALBUMIN 3.4*   No results for input(s): LIPASE, AMYLASE in the last 168 hours. No results for input(s): AMMONIA in the last 168 hours. CBC:  Recent Labs Lab 03/10/14 1951 03/11/14 0535  WBC 13.2* 12.9*  NEUTROABS 10.1*  --  HGB 14.1 13.1  HCT 42.4 39.5  MCV 86.7 88.0  PLT 278 258   Cardiac Enzymes: No results for input(s): CKTOTAL, CKMB, CKMBINDEX, TROPONINI in the last 168 hours. BNP (last 3 results)  Recent Labs  02/26/14 0750  PROBNP 24.4   CBG: No results for input(s): GLUCAP in the last 168 hours.  Micro No results found for this or any previous visit (from the past 240 hour(s)).   Studies: Ct Hand Right W Wo  Contrast  03/12/2014   CLINICAL DATA:  Pain and swelling right hand.  EXAM: CT OF THE RIGHT HAND WITHOUT AND WITH CONTRAST  TECHNIQUE: Multidetector CT imaging was performed following the standard protocol before and after bolus administration of intravenous contrast.  CONTRAST:  100 mL OMNIPAQUE IOHEXOL 300 MG/ML  SOLN  COMPARISON:  Plain films of the right little finger is 11/13/2004. Plain films right hand 02/17/2007 and 03/10/2014.  FINDINGS: The study is limited by the patient's size and difficulty with positioning. The patient was scanned with his hand over his abdomen due to limited mobility.  Soft tissues of the hand appear somewhat swollen but no focal fluid collection is identified. No evidence of tenosynovitis is identified. There is bony hypertrophy about the PIP joint of the little finger most consistent with osteoarthritis. This may be posttraumatic given remote history of trauma in 2006. The fifth MCP joint appears narrowed. Spurring off the radial aspect of the fifth metacarpal head is not well demonstrated on this examination. No definite erosion is seen. The appears to some joint space narrowing at the second MCP joint is well. No fracture or dislocation is identified.  IMPRESSION: Markedly limited study without evidence of tenosynovitis or abscess.  Degenerative change PIP joint right little finger may be posttraumatic.  Narrowing of the fifth MCP joint is better seen on prior plain films. Question prior trauma possibly gouty arthropathy.   Electronically Signed   By: Drusilla Kannerhomas  Dalessio M.D.   On: 03/12/2014 07:20    Scheduled Meds: . allopurinol  300 mg Oral Daily  . clindamycin (CLEOCIN) IV  600 mg Intravenous 3 times per day  . colchicine  0.6 mg Oral Daily  . enoxaparin (LOVENOX) injection  90 mg Subcutaneous Q24H  . gabapentin  300 mg Oral QID  . indomethacin  75 mg Oral BID WC  . methylPREDNISolone (SOLU-MEDROL) injection  80 mg Intravenous 3 times per day  . pantoprazole  40 mg  Oral BID AC  . sodium chloride  3 mL Intravenous Q12H  . vancomycin  1,500 mg Intravenous Q12H   Continuous Infusions:       Time spent: 35 minutes    [facility] HospitalELMAHI,[name]  [facility] Pager [phone] If 7PM-7AM, please contact night-coverage at [email or web address], password [email or web address] 03/13/2014, 12:12 PM  LOS: 3 days

## 2014-03-13 NOTE — Progress Notes (Signed)
Dear Doctor:  This patient has been identified as a candidate for PICC for the following reason (s): restarts due to phlebitis and infiltration in 24 hours If you agree, please write an order for the indicated device. For any questions contact the Vascular Access Team at [phone] if no answer, please leave a message.  Thank you for supporting the early vascular access assessment program. 

## 2014-03-14 LAB — BASIC METABOLIC PANEL
Anion gap: 11 (ref 5–15)
BUN: 24 mg/dL — AB (ref 6–23)
CO2: 23 meq/L (ref 19–32)
Calcium: 8.7 mg/dL (ref 8.4–10.5)
Chloride: 104 mEq/L (ref 96–112)
Creatinine, Ser: 0.91 mg/dL (ref 0.50–1.35)
GFR calc Af Amer: >90 mL/min (ref >90)
GFR calc non Af Amer: >90 mL/min (ref >90)
GLUCOSE: 180 mg/dL — AB (ref 70–99)
Potassium: 5.2 mEq/L (ref 3.7–5.3)
SODIUM: 138 meq/L (ref 137–147)

## 2014-03-14 LAB — CBC
HEMATOCRIT: 35.8 % — AB (ref 39.0–52.0)
Hemoglobin: 11.3 g/dL — ABNORMAL LOW (ref 13.0–17.0)
MCH: 27.3 pg (ref 26.0–34.0)
MCHC: 31.6 g/dL (ref 30.0–36.0)
MCV: 86.5 fL (ref 78.0–100.0)
Platelets: 268 10*3/uL (ref 150–400)
RBC: 4.14 MIL/uL — AB (ref 4.22–5.81)
RDW: 13.8 % (ref 11.5–15.5)
WBC: 14.4 10*3/uL — ABNORMAL HIGH (ref 4.0–10.5)

## 2014-03-14 MED ORDER — OXYCODONE HCL 5 MG PO TABS
15.0000 mg | ORAL_TABLET | ORAL | Status: DC | PRN
  Administered 2014-03-14 – 2014-03-16 (×11): 15 mg via ORAL
  Filled 2014-03-14 (×11): qty 3

## 2014-03-14 NOTE — Plan of Care (Signed)
Problem: Consults Goal: Cellulitis Patient Education See Patient Education Module for education specifics.  Outcome: Progressing  Problem: Phase I Progression Outcomes Goal: Pain controlled with appropriate interventions Outcome: Progressing Goal: OOB as tolerated unless otherwise ordered Outcome: Progressing Goal: Voiding-avoid urinary catheter unless indicated Outcome: Completed/Met Date Met:  03/14/14  Problem: Phase II Progression Outcomes Goal: Tolerating diet Outcome: Completed/Met Date Met:  03/14/14  Problem: Phase III Progression Outcomes Goal: Activity at appropriate level-compared to baseline (UP IN CHAIR FOR HEMODIALYSIS)  Outcome: Progressing  Problem: Discharge Progression Outcomes Goal: Tolerating diet Outcome: Completed/Met Date Met:  03/14/14

## 2014-03-14 NOTE — Progress Notes (Signed)
[facility] PROGRESS NOTE   Steven CoinsMichael E Atkins ZOX:[id number] DOB: November 02, 1985 DOA: 03/10/2014 PCP: No primary care provider on file.  HPI/Subjective: Although his redness and swelling improving since admission patient still complaining about severe pain. I am not sure why his pain is not improving, he is asking about Dilaudid and oxycodone in name to be increased.  Assessment/Plan: Principal Problem:   Cellulitis of hand Active Problems:   Morbid obesity   Cellulitis   Tenosynovitis of finger, hand, or wrist     Cellulitis of the right hand Patient reported swelling, redness and pain in the right hand. No much of improvement since yesterday. Had fever of 100.2 earlier today, per hem redness is worsening. Seen by Dr. Orlan Leavensrtman of hand surgery and recommended to treat as gouty arthritis. MRI ordered but patient couldn't fit in the scanner, CT scan done which showed no evidence of intra-articular infection. We'll continue treatment for gout, add NSAID and steroids. In the same time because of development of fever and worsening of the redness will broaden the antibiotics coverage. Continue to treat for both acute gout and cellulitis with current regimen.  Sepsis Temperature of 100.2, HR off 106 and WBC of 13.2 and presence of Suspected cellulitis. Patient currently on clindamycin, still spiking fevers and will broaden antibiotics. This is resolved  Morbid obesity Patient is 404 pounds, patient is super obese with BMI of around 63.5. Patient reported about 40 pound weight loss since last year.   Diarrhea Patient had 3 episodes of diarrhea since yesterday, patient is on clindamycin and vancomycin. Because of possibility of antibiotic related diarrhea will discontinue clindamycin. Diarrhea improved  Itching Patient complains about itching, said this is not new thing he had that at home. Less likely to be reaction to medications. Patient is asking about Benadryl.   Code  Status: Full code Family Communication: Plan discussed with the patient. Disposition Plan: Remains inpatient   Consultants:  ortho  Procedures:  None  Antibiotics:   clindamycin   Start Zosyn and vancomycin   Objective: Filed Vitals:   03/14/14 0517  BP: 114/71  Pulse: 50  Temp: 97.5 F (36.4 C)  Resp: 18    Intake/Output Summary (Last 24 hours) at 03/14/14 1213 Last data filed at 03/14/14 0315  Gross per 24 hour  Intake   1990 ml  Output    300 ml  Net   1690 ml   Filed Weights   03/10/14 1858 03/11/14 0100  Weight: 184.614 kg (407 lb) 183.5 kg (404 lb 8.7 oz)    Exam: General: Alert and awake, oriented x3, not in any acute distress. HEENT: anicteric sclera, pupils reactive to light and accommodation, EOMI CVS: S1-S2 clear, no murmur rubs or gallops Chest: clear to auscultation bilaterally, no wheezing, rales or rhonchi Abdomen: soft nontender, nondistended, normal bowel sounds, no organomegaly Extremities: no cyanosis, clubbing or edema noted bilaterally Neuro: Cranial nerves II-XII intact, no focal neurological deficits  Data Reviewed: Basic Metabolic Panel:  Recent Labs Lab 03/10/14 1951 03/11/14 0535 03/13/14 0331 03/14/14 0307  NA 138 138 134* 138  K 4.0 3.7 4.8 5.2  CL 98 97 99 104  CO2 26 30 24 23   GLUCOSE 114* 131* 181* 180*  BUN 8 8 16  24*  CREATININE 0.64 0.80 0.73 0.91  CALCIUM 9.0 8.9 9.0 8.7   Liver Function Tests:  Recent Labs Lab 03/11/14 0535  AST 11  ALT 16  ALKPHOS 70  BILITOT 0.6  PROT 7.5  ALBUMIN 3.4*  No results for input(s): LIPASE, AMYLASE in the last 168 hours. No results for input(s): AMMONIA in the last 168 hours. CBC:  Recent Labs Lab 03/10/14 1951 03/11/14 0535 03/14/14 0307  WBC 13.2* 12.9* 14.4*  NEUTROABS 10.1*  --   --   HGB 14.1 13.1 11.3*  HCT 42.4 39.5 35.8*  MCV 86.7 88.0 86.5  PLT 278 258 268   Cardiac Enzymes: No results for input(s): CKTOTAL, CKMB, CKMBINDEX, TROPONINI in the  last 168 hours. BNP (last 3 results)  Recent Labs  02/26/14 0750  PROBNP 24.4   CBG: No results for input(s): GLUCAP in the last 168 hours.  Micro No results found for this or any previous visit (from the past 240 hour(s)).   Studies: No results found.  Scheduled Meds: . allopurinol  300 mg Oral Daily  . colchicine  0.6 mg Oral Daily  . enoxaparin (LOVENOX) injection  90 mg Subcutaneous Q24H  . gabapentin  300 mg Oral QID  . indomethacin  75 mg Oral BID WC  . methylPREDNISolone (SOLU-MEDROL) injection  80 mg Intravenous 3 times per day  . pantoprazole  40 mg Oral BID AC  . saccharomyces boulardii  250 mg Oral BID  . sodium chloride  3 mL Intravenous Q12H  . vancomycin  1,500 mg Intravenous Q12H   Continuous Infusions:       Time spent: 35 minutes    [facility] HospitalELMA[facility],Amaree Leeper A  [facility] Pager [phone] If 7PM-7AM, please contact night-coverage at [email or web address], password [email or web address] 03/14/2014, 12:13 PM  LOS: 4 days

## 2014-03-15 MED ORDER — METHYLPREDNISOLONE SODIUM SUCC 125 MG IJ SOLR
80.0000 mg | Freq: Two times a day (BID) | INTRAMUSCULAR | Status: DC
  Administered 2014-03-15 – 2014-03-16 (×2): 80 mg via INTRAVENOUS
  Filled 2014-03-15 (×4): qty 1.28

## 2014-03-15 NOTE — Progress Notes (Signed)
[facility] PROGRESS NOTE   [name] [id number] DOB: 04-Mar-1986 DOA: 03/10/2014 PCP: No primary care provider on file.  HPI/Subjective: Redness and swelling improved, patient still reporting he is not able to use his right arm to clean himself after BMs. I've asked nursing staff to show him how to eat and wipe himself with his left hand. Anticipated discharge in AM.  Assessment/Plan: Principal Problem:   Cellulitis of hand Active Problems:   Morbid obesity   Cellulitis   Tenosynovitis of finger, hand, or wrist     Cellulitis of the right hand Patient reported swelling, redness and pain in the right hand. Had fever of 100.2 earlier today, per hem redness is worsening. Seen by Dr. Orlan Leavensrtman of hand surgery and recommended to treat as gouty arthritis. MRI ordered but patient couldn't fit in the scanner, CT scan done which showed no evidence of intra-articular infection. We'll continue treatment for gout, add NSAID and steroids. Continue to treat for both acute gout and cellulitis with current regimen.  Sepsis Temperature of 100.2, HR off 106 and WBC of 13.2 and presence of Suspected cellulitis. Patient currently on clindamycin, still spiking fevers and will broaden antibiotics. This is resolved  Morbid obesity Patient is 404 pounds, patient is super obese with BMI of around 63.5. Patient reported about 40 pound weight loss since last year.   Diarrhea Patient had 3 episodes of diarrhea since yesterday, patient is on clindamycin and vancomycin. Because of possibility of antibiotic related diarrhea will discontinue clindamycin. Diarrhea improved  Itching Patient complains about itching, said this is not new thing he had that at home. Less likely to be reaction to medications. Patient is asking about Benadryl.   Code Status: Full code Family Communication: Plan discussed with the patient. Disposition Plan: Remains  inpatient   Consultants:  ortho  Procedures:  None  Antibiotics:   clindamycin   Start Zosyn and vancomycin   Objective: Filed Vitals:   03/15/14 0555  BP: 115/56  Pulse: 51  Temp: 97.6 F (36.4 C)  Resp: 20    Intake/Output Summary (Last 24 hours) at 03/15/14 1140 Last data filed at 03/15/14 0809  Gross per 24 hour  Intake      0 ml  Output    550 ml  Net   -550 ml   Filed Weights   03/10/14 1858 03/11/14 0100  Weight: 184.614 kg (407 lb) 183.5 kg (404 lb 8.7 oz)    Exam: General: Alert and awake, oriented x3, not in any acute distress. HEENT: anicteric sclera, pupils reactive to light and accommodation, EOMI CVS: S1-S2 clear, no murmur rubs or gallops Chest: clear to auscultation bilaterally, no wheezing, rales or rhonchi Abdomen: soft nontender, nondistended, normal bowel sounds, no organomegaly Extremities: no cyanosis, clubbing or edema noted bilaterally Neuro: Cranial nerves II-XII intact, no focal neurological deficits  Data Reviewed: Basic Metabolic Panel:  Recent Labs Lab 03/10/14 1951 03/11/14 0535 03/13/14 0331 03/14/14 0307  NA 138 138 134* 138  K 4.0 3.7 4.8 5.2  CL 98 97 99 104  CO2 26 30 24 23   GLUCOSE 114* 131* 181* 180*  BUN 8 8 16  24*  CREATININE 0.64 0.80 0.73 0.91  CALCIUM 9.0 8.9 9.0 8.7   Liver Function Tests:  Recent Labs Lab 03/11/14 0535  AST 11  ALT 16  ALKPHOS 70  BILITOT 0.6  PROT 7.5  ALBUMIN 3.4*   No results for input(s): LIPASE, AMYLASE in the last 168 hours. No results for input(s): AMMONIA  in the last 168 hours. CBC:  Recent Labs Lab 03/10/14 1951 03/11/14 0535 03/14/14 0307  WBC 13.2* 12.9* 14.4*  NEUTROABS 10.1*  --   --   HGB 14.1 13.1 11.3*  HCT 42.4 39.5 35.8*  MCV 86.7 88.0 86.5  PLT 278 258 268   Cardiac Enzymes: No results for input(s): CKTOTAL, CKMB, CKMBINDEX, TROPONINI in the last 168 hours. BNP (last 3 results)  Recent Labs  02/26/14 0750  PROBNP 24.4   CBG: No  results for input(s): GLUCAP in the last 168 hours.  Micro No results found for this or any previous visit (from the past 240 hour(s)).   Studies: No results found.  Scheduled Meds: . allopurinol  300 mg Oral Daily  . colchicine  0.6 mg Oral Daily  . enoxaparin (LOVENOX) injection  90 mg Subcutaneous Q24H  . gabapentin  300 mg Oral QID  . indomethacin  75 mg Oral BID WC  . methylPREDNISolone (SOLU-MEDROL) injection  80 mg Intravenous 3 times per day  . pantoprazole  40 mg Oral BID AC  . saccharomyces boulardii  250 mg Oral BID  . sodium chloride  3 mL Intravenous Q12H  . vancomycin  1,500 mg Intravenous Q12H   Continuous Infusions:       Time spent: 35 minutes    [facility] HospitalELMAHI,Leina Babe A  [facility] Pager [phone] If 7PM-7AM, please contact night-coverage at [email or web address], password [email or web address] 03/15/2014, 11:40 AM  LOS: 5 days

## 2014-03-15 NOTE — Progress Notes (Signed)
Patient refused PM vitals.

## 2014-03-15 NOTE — Evaluation (Signed)
Physical Therapy Evaluation Patient Details Name: [name] MRN: [id number] DOB: Oct 03, 1985 Today's Date: 03/15/2014   History of Present Illness  28 y/o male presents to the ED with chief complaint of two-day history of increasing pain and redness over the right fourth and fifth digits. Patient able to move the hand due to pain. And rates pain as 10/10 in intensity with worse on movement  Clinical Impression  Pt is at or approaching baseline functioning and is at an independent level with exception of bathing and caring for his toileting needs.  Will have ask for OT consult for 1 session to attempt to address this need.  Acute PT will sign off at this time.    Follow Up Recommendations No PT follow up    Equipment Recommendations  None recommended by PT    Recommendations for Other Services       Precautions / Restrictions        Mobility  Bed Mobility Overal bed mobility: Independent                Transfers Overall transfer level: Independent                  Ambulation/Gait Ambulation/Gait assistance: Independent Ambulation Distance (Feet): 50 Feet Assistive device: None Gait Pattern/deviations: Step-through pattern     General Gait Details: waddling, straight-legged gait pattern, but generally safe and functional given his size and h/o gouty arthritis  Stairs            Wheelchair Mobility    Modified Rankin (Stroke Patients Only)       Balance Overall balance assessment: Needs assistance Sitting-balance support: No upper extremity supported Sitting balance-Leahy Scale: Good     Standing balance support: No upper extremity supported Standing balance-Leahy Scale: Good Standing balance comment: surprising balance                              Pertinent Vitals/Pain Pain Assessment: Faces Faces Pain Scale: Hurts little more Pain Location: R hand Pain Intervention(s): Limited activity within patient's tolerance     Home Living Family/patient expects to be discharged to:: Private residence Living Arrangements: Parent Available Help at Discharge: Available PRN/intermittently Type of Home: Other(Comment) (soon to move to his aunt's home to live in their basement)       Home Layout: One level        Prior Function Level of Independence: Independent         Comments: Will need assist with clean up/pericare     Hand Dominance        Extremity/Trunk Assessment               Lower Extremity Assessment: Overall WFL for tasks assessed;RLE deficits/detail;LLE deficits/detail RLE Deficits / Details: limited knee flexion LLE Deficits / Details: limited knee flexion     Communication   Communication: No difficulties  Cognition Arousal/Alertness: Awake/alert Behavior During Therapy: WFL for tasks assessed/performed Overall Cognitive Status: Within Functional Limits for tasks assessed                      General Comments      Exercises        Assessment/Plan    PT Assessment Patent does not need any further PT services  PT Diagnosis     PT Problem List    PT Treatment Interventions     PT Goals (Current goals can be found  in the Care Plan section) Acute Rehab PT Goals PT Goal Formulation: All assessment and education complete, DC therapy    Frequency     Barriers to discharge        Co-evaluation               End of Session   Activity Tolerance: Patient tolerated treatment well Patient left: in bed;with call bell/phone within reach Nurse Communication: Mobility status         Time: [id number] PT Time Calculation (min) (ACUTE ONLY): 29 min   Charges:   PT Evaluation $Initial PT Evaluation Tier I: 1 Procedure PT Treatments $Gait Training: 8-22 mins   PT G Codes:          [name], Eliseo GumKenneth V 03/15/2014, 4:59 PM 03/15/2014  [name] Kambre Messner, PT [phone]  (pager)

## 2014-03-15 NOTE — Progress Notes (Signed)
ANTIBIOTIC CONSULT NOTE   Pharmacy Consult for Vancomycin Indication: cellulitis of hand  Allergies  Allergen Reactions  . Asa [Aspirin] Other (See Comments)    Pt doesn't know reaction.  . Nsaids Other (See Comments)    Causes Ulcers   . Penicillins Other (See Comments)    Pt doesn't know reaction.  . Sulfa Antibiotics Other (See Comments)    Pt doesn't know reaction.    Patient Measurements: Height: 5\' 7"  (170.2 cm) Weight: (!) 404 lb 8.7 oz (183.5 kg) IBW/kg (Calculated) : 66.1  Vital Signs: Temp: 97.6 F (36.4 C) (11/29 0555) Temp Source: Oral (11/29 0555) BP: 115/56 mmHg (11/29 0555) Pulse Rate: 51 (11/29 0555) Intake/Output from previous day: 11/28 0701 - 11/29 0700 In: 360 [P.O.:360] Out: 450 [Urine:450] Intake/Output from this shift:    Labs:  Recent Labs  03/13/14 0331 03/14/14 0307  WBC  --  14.4*  HGB  --  11.3*  PLT  --  268  CREATININE 0.73 0.91   Estimated Creatinine Clearance: 193.3 mL/min (by C-G formula based on Cr of 0.91). No results for input(s): VANCOTROUGH, VANCOPEAK, VANCORANDOM, GENTTROUGH, GENTPEAK, GENTRANDOM, TOBRATROUGH, TOBRAPEAK, TOBRARND, AMIKACINPEAK, AMIKACINTROU, AMIKACIN in the last 72 hours.   Microbiology: No results found for this or any previous visit (from the past 720 hour(s)).  Medical History: Past Medical History  Diagnosis Date  . Gout   . Cellulitis   . Obesity   . Erosive esophagitis 09/01/2012    NSAID induced.  . Gastric ulcer with hemorrhage 09/02/2012    s/p bleeding control tx. Per Dr. Jena Gaussourk  . Acute blood loss anemia 09/02/2012    S/p 1 unit rbcs  . UTI (lower urinary tract infection)   . Gall stones     Medications:  Prescriptions prior to admission  Medication Sig Dispense Refill Last Dose  . allopurinol (ZYLOPRIM) 300 MG tablet Take 300 mg by mouth daily.   03/10/2014 at Unknown time  . benazepril (LOTENSIN) 5 MG tablet Take 5 mg by mouth daily.   03/10/2014 at Unknown time  . gabapentin  (NEURONTIN) 300 MG capsule Take 300 mg by mouth 3 (three) times daily.    03/10/2014 at Unknown time  . ranitidine (ZANTAC) 300 MG tablet Take 300 mg by mouth at bedtime.   03/10/2014 at Unknown time  . albuterol (PROVENTIL HFA;VENTOLIN HFA) 108 (90 BASE) MCG/ACT inhaler Inhale 2 puffs into the lungs every 6 (six) hours as needed for wheezing or shortness of breath. 1 Inhaler 2 prescription never filled   Assessment: 28 year old man with right hand cellulitis to continue on vancomycin  Renal function is normal for age.    Goal of Therapy:  Vancomycin trough level 10-15 mcg/ml  Plan:  Cont vanc  1500 mg IV q12 hours Check VT if continues   Shary Lamos Poteet 03/15/2014,8:25 AM

## 2014-03-15 NOTE — Plan of Care (Signed)
Problem: Phase I Progression Outcomes Goal: OOB as tolerated unless otherwise ordered Outcome: Completed/Met Date Met:  03/15/14 Goal: Initial discharge plan identified Outcome: Progressing

## 2014-03-16 DIAGNOSIS — M109 Gout, unspecified: Secondary | ICD-10-CM

## 2014-03-16 DIAGNOSIS — L02519 Cutaneous abscess of unspecified hand: Secondary | ICD-10-CM

## 2014-03-16 DIAGNOSIS — M6588 Other synovitis and tenosynovitis, other site: Secondary | ICD-10-CM

## 2014-03-16 MED ORDER — PANTOPRAZOLE SODIUM 40 MG PO TBEC: 40.0000 mg | DELAYED_RELEASE_TABLET | Freq: Every day | ORAL | Status: DC

## 2014-03-16 MED ORDER — COLCHICINE 0.6 MG PO TABS: 0.6000 mg | ORAL_TABLET | Freq: Every day | ORAL | Status: DC

## 2014-03-16 MED ORDER — DOXYCYCLINE HYCLATE 100 MG PO TABS: 100.0000 mg | ORAL_TABLET | Freq: Two times a day (BID) | ORAL | Status: DC

## 2014-03-16 MED ORDER — PREDNISONE (PAK) 10 MG PO TABS: ORAL_TABLET | ORAL | Status: DC

## 2014-03-16 MED ORDER — OXYCODONE-ACETAMINOPHEN 5-325 MG PO TABS: 1.0000 | ORAL_TABLET | Freq: Four times a day (QID) | ORAL | Status: DC | PRN

## 2014-03-16 NOTE — Progress Notes (Signed)
Occupational Therapy Evaluation Patient Details Name: Gardiner CoinsMichael E Olver MRN: [id number] DOB: 10-25-1985 Today's Date: 03/16/2014    History of Present Illness 28 y/o male presents to the ED with chief complaint of two-day history of increasing pain and redness over the right fourth and fifth digits. Patient able to move the hand due to pain. And rates pain as 10/10 in intensity with worse on movement   Clinical Impression   PTA, pt mod I with ADL and mobility. Pt now with hand splint removed and is able to complete his ADL at baseline level. Pt ambulating @ mod I level @ room. Pt asking for w/c to use during his "flare ups" when he has difficulty walking and ends up crawling on the floor due to pain in his feet/BLE. Discussed with pt and recommended that he have this discussion with his primary MD. Recommended use of DME for bathroom, but pt declined. Feel pt safe to D/C home from OT standpoint    Follow Up Recommendations  No OT follow up;Supervision - Intermittent    Equipment Recommendations  Wheelchair (width 29; leg length 21);Wheelchair cushion (measurements OT) (this can be addressed by pt's primary MD)    Recommendations for Other Services       Precautions / Restrictions Precautions Precautions: Fall      Mobility Bed Mobility Overal bed mobility: Independent                Transfers Overall transfer level:Modified Independent               General transfer comment: Pt reports that there are times at home where he has so much pain in his feet/legs that he can not walk and has to crawl around on the floor. Feel a w/c would increase his independence and safety with mobility    Balance     Sitting balance-Leahy Scale: Good       Standing balance-Leahy Scale: Good                              ADL Overall ADL's : At baseline                                       General ADL Comments: Pt now able to use his R hand for  [employer]. Discussed use of shower seat due to pt reporting that he has difficulty standing. Pt states that he does not like using chairs in the shower.      Vision                     Perception     Praxis      Pertinent Vitals/Pain Pain Assessment: 0-10 Pain Score: 3  Pain Location: r hand Pain Descriptors / Indicators: Aching Pain Intervention(s): Limited activity within patient's tolerance;Monitored during session     Hand Dominance Right   Extremity/Trunk Assessment Upper Extremity Assessment Upper Extremity Assessment: RUE deficits/detail RUE Deficits / Details: limitations of R 4th and 5th digit ROM. @ 20 degrees MP flexion and PIP flex of 4th digit. Pt with limitations of ROM PTA from previous trauma/fx RUE: Unable to fully assess due to pain   Lower Extremity Assessment Lower Extremity Assessment: Defer to PT evaluation       Communication Communication Communication: No difficulties   Cognition Arousal/Alertness: Awake/alert Behavior During Therapy:  WFL for tasks assessed/performed Overall Cognitive Status: Within Functional Limits for tasks assessed                     General Comments       Exercises       Shoulder Instructions      Home Living Family/patient expects to be discharged to:: Private residence Living Arrangements: Parent Available Help at Discharge: Available PRN/intermittently Type of Home: Other(Comment) (soon to move to his aunt's home to live in their basement) Home Access: Stairs to enter Entrance Stairs-Number of Steps: 1 small step   Home Layout: One level     Bathroom Shower/Tub: Engineer, productionTub/shower unit     Bathroom Accessibility: Yes How Accessible: Accessible via walker     Additional Comments: states taht he can not use shower chairs      Prior Functioning/Environment Level of Independence: Independent             OT Diagnosis: Generalized weakness;Acute pain   OT Problem List:  Obesity;Pain;Decreased strength;Decreased activity tolerance;Decreased knowledge of use of DME or AE   OT Treatment/Interventions:      OT Goals(Current goals can be found in the care plan section) Acute Rehab OT Goals Patient Stated Goal: none stated OT Goal Formulation: With patient  OT Frequency:     Barriers to D/C:            Co-evaluation              End of Session Nurse Communication: Mobility status;Other (comment) (D/C recommendations)  Activity Tolerance: Patient tolerated treatment well Patient left: in bed;with call bell/phone within reach   Time: [id number] OT Time Calculation (min): 33 min Charges:  OT General Charges $OT Visit: 1 Procedure OT Evaluation $Initial OT Evaluation Tier I: 1 Procedure OT Treatments $Self Care/Home Management : 23-37 mins G-Codes:    Lehua Flores,HILLARY 03/16/2014, 10:26 AM   Luisa DagoHilary Darrall Strey, OTR/L  [phone] 03/16/2014

## 2014-03-16 NOTE — Clinical Social Work Note (Addendum)
11:15am CSW spoke with Medicaid Transport- transportation will come pick up the patient at 2pm and take him to [facility]- [address], [place].  10:10am CSW spoke with patient at bedside concerning lack of transportation.  Patient states that he lives with his mother but his mother does not have a car capable of driving on the highway.  Patient states that all other family does not have a car or is incapable of driving to [place].    CSW explained Medicaid transportation and patient stated he was unaware of this resource.  CSW called [place] [facility] [phone] and spoke with Medicaid transport.  Medicaid Transport is working on finding a driver for the patient at this time and will call CSW to inform if patient can be picked up.  CSW will continue to follow.  Merlyn LotJenna Holoman, LCSWA Clinical Social Worker [phone]

## 2014-03-16 NOTE — Discharge Summary (Signed)
Physician Discharge Summary  [name]:[id number] DOB: [birth year] DOA: 03/10/2014  PCP: Toma Deiters, MD  Admit date: 03/10/2014 Discharge date: 03/16/2014  Time spent: 40 minutes  Recommendations for Outpatient Follow-up:  1. Follow-up with Dr. Olena Leatherwood on December 10 at 3 PM  Discharge Diagnoses:  Principal Problem:   Cellulitis of hand Active Problems:   Morbid obesity   Cellulitis   Tenosynovitis of finger, hand, or wrist   Discharge Condition: Stable  Diet recommendation: Heart healthy diet  Filed Weights   03/10/14 1858 03/11/14 0100  Weight: 184.614 kg (407 lb) 183.5 kg (404 lb 8.7 oz)    History of present illness:  28 year old male who  has a past medical history of Gout; Cellulitis; Obesity; Erosive esophagitis (09/01/2012); Gastric ulcer with hemorrhage (09/02/2012); Acute blood loss anemia (09/02/2012); UTI (lower urinary tract infection); and Gall stones. Today presents to the ED with chief complaint of two-day history of increasing pain and redness over the right fourth and fifth digits. Patient able to move the hand due to pain. And rates pain as 10/10 in intensity with worse on movement. Hand surgeon at [facility] was called by the ED physician as there was a concern for cellulitis versus tenosynovitis. Hands surgeon told that he was unable to value at the patient due to the workload and deferred to orthopedics at [facility]. Dr. Wilkie Aye discussed with Dr. Romeo Apple and he reviewed the films, and recommended IV antibiotics for tonight with evaluation by hand surgeon in a.m.  Hospital Course:    Cellulitis of the right hand Patient presented with swelling, redness and pain in the right hand. Had fever of 100.2, redness and swelling worsened a little bit while he was in the hospital initially. Seen by Dr. Orlan Leavens of hand surgery and recommended to treat as gouty arthritis. MRI ordered but patient couldn't fit in the scanner, CT scan done which showed no  evidence of intra-articular infection. We'll continue treatment for gout, add NSAID and steroids. This is treated for both acute gouty arthritis and hand cellulitis. Discharged on prednisone taper, allopurinol and colchicine for gouty arthritis. Discharge also on doxycycline for 7 more days for hand infection.  Sepsis Temperature of 100.2, HR off 106 and WBC of 13.2 and presence of Suspected cellulitis. Was on clindamycin initially, switched to vancomycin. Sepsis physiology resolved prior to discharge, discharged on doxycycline for 7 more days.  Morbid obesity Patient is 404 pounds, patient is super obese with BMI of around 63.5. Patient reported about 40 pound weight loss since last year.   Diarrhea Patient had 3 episodes of diarrhea since yesterday, patient is on clindamycin and vancomycin. Because of possibility of antibiotic related diarrhea will discontinue clindamycin. Diarrhea resolved prior to discharge.  Itching Patient complains about itching, said this is not new thing he had that at home. Less likely to be reaction to medications. Patient is asking about Benadryl.   Hand pain Upon admission patient expected to have some pain in his right hand secondary to gouty arthritis/cellulitis. Patient swelling and redness improved, patient still was complaining about unbearable pain. Patient was asking about oxycodone and Dilaudid by name whenever he was able to get these medications. Patient also mentioned he was unable to wipe himself, then when about to be discharged he mentioned he doesn't have anyone at home to help him. He mentioned also to the OT that when he gets very bad gouty arthritis attacks he can fall that he has to crawl and he wants wheelchair to be  delivered to his home. I prescribed only 20 pills of Percocet for him, I think this should be appropriate as his pain/redness almost resolved. I have asked him to follow-up with his primary care physician, I have called  personally and set up appointment on December 10 at 3 PM.   Procedures:  None  Consultations:  Hand surgery  Discharge Exam: Filed Vitals:   03/16/14 0637  BP: 134/85  Pulse: 51  Temp: 97.4 F (36.3 C)  Resp: 19   General: Alert and awake, oriented x3, not in any acute distress. HEENT: anicteric sclera, pupils reactive to light and accommodation, EOMI CVS: S1-S2 clear, no murmur rubs or gallops Chest: clear to auscultation bilaterally, no wheezing, rales or rhonchi Abdomen: soft nontender, nondistended, normal bowel sounds, no organomegaly Extremities: no cyanosis, clubbing or edema noted bilaterally Neuro: Cranial nerves II-XII intact, no focal neurological deficits  Discharge Instructions You were cared for by a hospitalist during your hospital stay. If you have any questions about your discharge medications or the care you received while you were in the hospital after you are discharged, you can call the unit and asked to speak with the hospitalist on call if the hospitalist that took care of you is not available. Once you are discharged, your primary care physician will handle any further medical issues. Please note that NO REFILLS for any discharge medications will be authorized once you are discharged, as it is imperative that you return to your primary care physician (or establish a relationship with a primary care physician if you do not have one) for your aftercare needs so that they can reassess your need for medications and monitor your lab values.  Discharge Instructions    Diet - low sodium heart healthy    Complete by:  As directed      Increase activity slowly    Complete by:  As directed           Current Discharge Medication List    START taking these medications   Details  doxycycline (VIBRA-TABS) 100 MG tablet Take 1 tablet (100 mg total) by mouth 2 (two) times daily. Qty: 14 tablet, Refills: 0    oxyCODONE-acetaminophen (ROXICET) 5-325 MG per tablet  Take 1 tablet by mouth every 6 (six) hours as needed. Qty: 20 tablet, Refills: 0    predniSONE (STERAPRED UNI-PAK) 10 MG tablet Take 6-5-4-3-2-1 tablets PO daily till gone Qty: 21 tablet, Refills: 0      CONTINUE these medications which have CHANGED   Details  colchicine 0.6 MG tablet Take 1 tablet (0.6 mg total) by mouth daily. Qty: 30 tablet, Refills: 0    pantoprazole (PROTONIX) 40 MG tablet Take 1 tablet (40 mg total) by mouth daily. Qty: 30 tablet, Refills: 0      CONTINUE these medications which have NOT CHANGED   Details  allopurinol (ZYLOPRIM) 300 MG tablet Take 300 mg by mouth daily.    benazepril (LOTENSIN) 5 MG tablet Take 5 mg by mouth daily.    gabapentin (NEURONTIN) 300 MG capsule Take 300 mg by mouth 3 (three) times daily.     albuterol (PROVENTIL HFA;VENTOLIN HFA) 108 (90 BASE) MCG/ACT inhaler Inhale 2 puffs into the lungs every 6 (six) hours as needed for wheezing or shortness of breath. Qty: 1 Inhaler, Refills: 2      STOP taking these medications     ranitidine (ZANTAC) 300 MG tablet        Allergies  Allergen Reactions  . Jonne Ply [  Aspirin] Other (See Comments)    Pt doesn't know reaction.  . Nsaids Other (See Comments)    Causes Ulcers   . Penicillins Other (See Comments)    Pt doesn't know reaction.  . Sulfa Antibiotics Other (See Comments)    Pt doesn't know reaction.   Follow-up Information    [facility]2014.   Specialty:  Internal Medicine   Why:  At 3:00 PM   Contact information:   [address] [place] [id number] [phone]        The results of significant diagnostics from this hospitalization (including imaging, microbiology, ancillary and laboratory) are listed below for reference.    Significant Diagnostic Studies: Dg Chest 2 View  03/11/2014   CLINICAL DATA:  Dyspnea  EXAM: CHEST  2 VIEW  COMPARISON:  02/26/2014  FINDINGS: No cardiomegaly. The negative aortic and hilar contours. Low lung  volumes with interstitial crowding. There is no edema, consolidation, effusion, or pneumothorax.  IMPRESSION: No active cardiopulmonary disease.   Electronically Signed   By: Tiburcio PeaJonathan  Watts M.D.   On: 03/11/2014 06:24   Ct Head Wo Contrast  02/26/2014   CLINICAL DATA:  Two day history of headache. Three-day history of sinus drainage  EXAM: CT HEAD WITHOUT CONTRAST  TECHNIQUE: Contiguous axial images were obtained from the base of the skull through the vertex without intravenous contrast. Note that intravenous contrast had been administered are earlier in the day for CT angiogram chest.  COMPARISON:  None.  FINDINGS: The ventricles are normal in size and configuration. There is no mass, hemorrhage, extra-axial fluid collection, or midline shift. Gray-white compartments appear normal. No acute infarct apparent.  The bony calvarium appears intact. The visualized mastoid air cells are clear.  There is opacification of the visualized maxillary antra with probable polypoid formation in these areas. There is extensive polypoid formation throughout most of the ethmoid air cells bilaterally. There are polyps in the right frontal sinus region. There is slight mucosal thickening in the sphenoid sinuses. No intraorbital lesions are identified. There are multiple mildly prominent lymph nodes posterior to each mastoid air cell complex, likely reactive from the paranasal sinus disease.  IMPRESSION: Extensive paranasal sinus disease with extensive polypoid formation as noted above. Lymph node prominence in the posterior neck region bilaterally is probably due to the sinusitis. No intracranial lesion is seen. No intracranial mass, hemorrhage, or focal gray -white compartment lesion/acute infarct. 22   Electronically Signed   By: Bretta BangWilliam  Woodruff M.D.   On: 02/26/2014 09:53   Ct Angio Chest Pe W/cm &/or Wo Cm  02/26/2014   CLINICAL DATA:  Hemoptysis  EXAM: CT ANGIOGRAPHY CHEST WITH CONTRAST  TECHNIQUE: Multidetector CT  imaging of the chest was performed using the standard protocol during bolus administration of intravenous contrast. Multiplanar CT image reconstructions and MIPs were obtained to evaluate the vascular anatomy.  CONTRAST:  150mL OMNIPAQUE IOHEXOL 350 MG/ML SOLN  COMPARISON:  February 26, 2014 chest radiograph  FINDINGS: There is no demonstrable pulmonary embolus. There is no thoracic aortic aneurysm or dissection.  On axial slice 56 series 4, there is a 6 mm nodular opacity in the lateral segment of the left lower lobe. On axial slice 54 series 4, there is a 3 mm nodular opacity in the lateral segment of the right lower lobe. There is mild atelectatic change in the medial aspect of the right middle lobe near the right heart border as well as in the inferior  most aspect of the lingula. There is no frank edema or consolidation.  There is no demonstrable pulmonary embolus. Pericardium is not appreciably thickened. Heart is borderline enlarged.  In the visualized upper abdomen, there is hepatic steatosis. There is a left adrenal adenoma measuring 2.2 x 2.6 cm. There are no blastic or lytic bone lesions. Thyroid appears normal.  Review of the MIP images confirms the above findings.  IMPRESSION: No demonstrable pulmonary embolus. Nodular opacities as noted above, largest measuring 6 mm. Followup of these nodular opacity should be based on Fleischner Society guidelines. If the patient is at high risk for bronchogenic carcinoma, follow-up chest CT at 6-12 months is recommended. If the patient is at low risk for bronchogenic carcinoma, follow-up chest CT at 12 months is recommended. This recommendation follows the consensus statement: Guidelines for Management of Small Pulmonary Nodules Detected on CT Scans: A Statement from the Fleischner Society as published in Radiology 2005;237:395-400.  Bibasilar atelectasis anteriorly. No airspace consolidation. No adenopathy. Heart borderline enlarged.  Left adrenal adenoma.  Hepatic  steatosis.   Electronically Signed   By: Bretta Bang M.D.   On: 02/26/2014 09:08   Ct Hand Right W Wo Contrast  03/12/2014   CLINICAL DATA:  Pain and swelling right hand.  EXAM: CT OF THE RIGHT HAND WITHOUT AND WITH CONTRAST  TECHNIQUE: Multidetector CT imaging was performed following the standard protocol before and after bolus administration of intravenous contrast.  CONTRAST:  100 mL OMNIPAQUE IOHEXOL 300 MG/ML  SOLN  COMPARISON:  Plain films of the right little finger is 11/13/2004. Plain films right hand 02/17/2007 and 03/10/2014.  FINDINGS: The study is limited by the patient's size and difficulty with positioning. The patient was scanned with his hand over his abdomen due to limited mobility.  Soft tissues of the hand appear somewhat swollen but no focal fluid collection is identified. No evidence of tenosynovitis is identified. There is bony hypertrophy about the PIP joint of the little finger most consistent with osteoarthritis. This may be posttraumatic given remote history of trauma in 2006. The fifth MCP joint appears narrowed. Spurring off the radial aspect of the fifth metacarpal head is not well demonstrated on this examination. No definite erosion is seen. The appears to some joint space narrowing at the second MCP joint is well. No fracture or dislocation is identified.  IMPRESSION: Markedly limited study without evidence of tenosynovitis or abscess.  Degenerative change PIP joint right little finger may be posttraumatic.  Narrowing of the fifth MCP joint is better seen on prior plain films. Question prior trauma possibly gouty arthropathy.   Electronically Signed   By: Drusilla Kanner M.D.   On: 03/12/2014 07:20   Dg Chest Portable 1 View  02/26/2014   CLINICAL DATA:  Shortness of breath and cough.  EXAM: PORTABLE CHEST - 1 VIEW  COMPARISON:  11/07/2012  FINDINGS: No cardiomegaly. Negative aortic and hilar contours. Increased density of the lower chest related to underpenetration and  shallow inspiration. Prominent density at the medial right base is stable from prior. There is no edema, definitive consolidation, effusion, or pneumothorax.  IMPRESSION: No active disease.   Electronically Signed   By: Tiburcio Pea M.D.   On: 02/26/2014 07:41   Dg Hand Complete Right  03/10/2014   CLINICAL DATA:  Right hand pain, no known injury, initial encounter  EXAM: RIGHT HAND - COMPLETE 3+ VIEW  COMPARISON:  02/17/2007  FINDINGS: Significant degenerative changes are noted in the fifth proximal interphalangeal joint which  have increased in the interval from the prior exam. No acute fracture or dislocation is noted. Generalized soft tissue swelling is noted as well.  IMPRESSION: Degenerative change in the fifth digit as described. No acute bony abnormality is seen. Generalized soft tissue swelling is noted.   Electronically Signed   By: Alcide CleverMark  Lukens M.D.   On: 03/10/2014 20:57    Microbiology: No results found for this or any previous visit (from the past 240 hour(s)).   Labs: Basic Metabolic Panel:  Recent Labs Lab 03/10/14 1951 03/11/14 0535 03/13/14 0331 03/14/14 0307  NA 138 138 134* 138  K 4.0 3.7 4.8 5.2  CL 98 97 99 104  CO2 26 30 24 23   GLUCOSE 114* 131* 181* 180*  BUN 8 8 16  24*  CREATININE 0.64 0.80 0.73 0.91  CALCIUM 9.0 8.9 9.0 8.7   Liver Function Tests:  Recent Labs Lab 03/11/14 0535  AST 11  ALT 16  ALKPHOS 70  BILITOT 0.6  PROT 7.5  ALBUMIN 3.4*   No results for input(s): LIPASE, AMYLASE in the last 168 hours. No results for input(s): AMMONIA in the last 168 hours. CBC:  Recent Labs Lab 03/10/14 1951 03/11/14 0535 03/14/14 0307  WBC 13.2* 12.9* 14.4*  NEUTROABS 10.1*  --   --   HGB 14.1 13.1 11.3*  HCT 42.4 39.5 35.8*  MCV 86.7 88.0 86.5  PLT 278 258 268   Cardiac Enzymes: No results for input(s): CKTOTAL, CKMB, CKMBINDEX, TROPONINI in the last 168 hours. BNP: BNP (last 3 results)  Recent Labs  02/26/14 0750  PROBNP 24.4    CBG: No results for input(s): GLUCAP in the last 168 hours.     Signed:  Tamy Accardo A  [facility] 03/16/2014, 10:39 AM

## 2014-03-17 LAB — GI PATHOGEN PANEL BY PCR, STOOL
C difficile toxin A/B: NEGATIVE
CAMPYLOBACTER BY PCR: NEGATIVE
Cryptosporidium by PCR: NEGATIVE
E COLI (ETEC) LT/ST: NEGATIVE
E coli (STEC): NEGATIVE
E coli 0157 by PCR: NEGATIVE
G LAMBLIA BY PCR: NEGATIVE
NOROVIRUS G1/G2: NEGATIVE
ROTAVIRUS A BY PCR: NEGATIVE
SHIGELLA BY PCR: NEGATIVE
Salmonella by PCR: NEGATIVE

## 2014-03-31 IMAGING — CR DG FOOT COMPLETE 3+V*L*
3 series · 3 of 3 positions shown · non-contrast
Comparison: Left foot x-rays 08/30/2012 [facility]

CLINICAL DATA: Left foot pain, prior history of gout.

LEFT FOOT - COMPLETE 3+ VIEW

[view not recorded (1 of 3)]
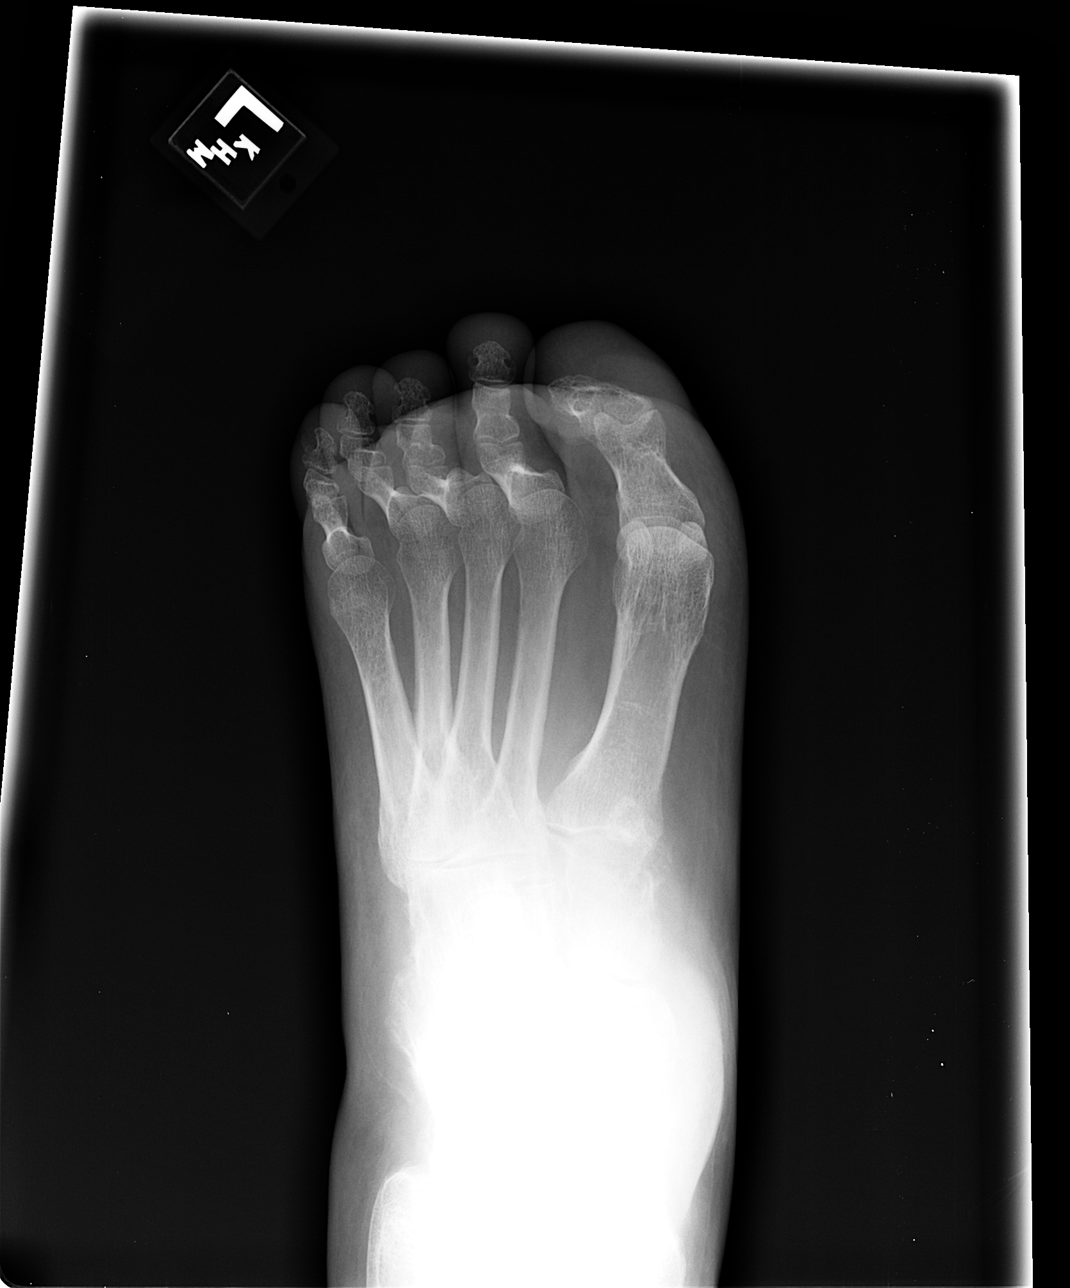

[view not recorded (2 of 3)]
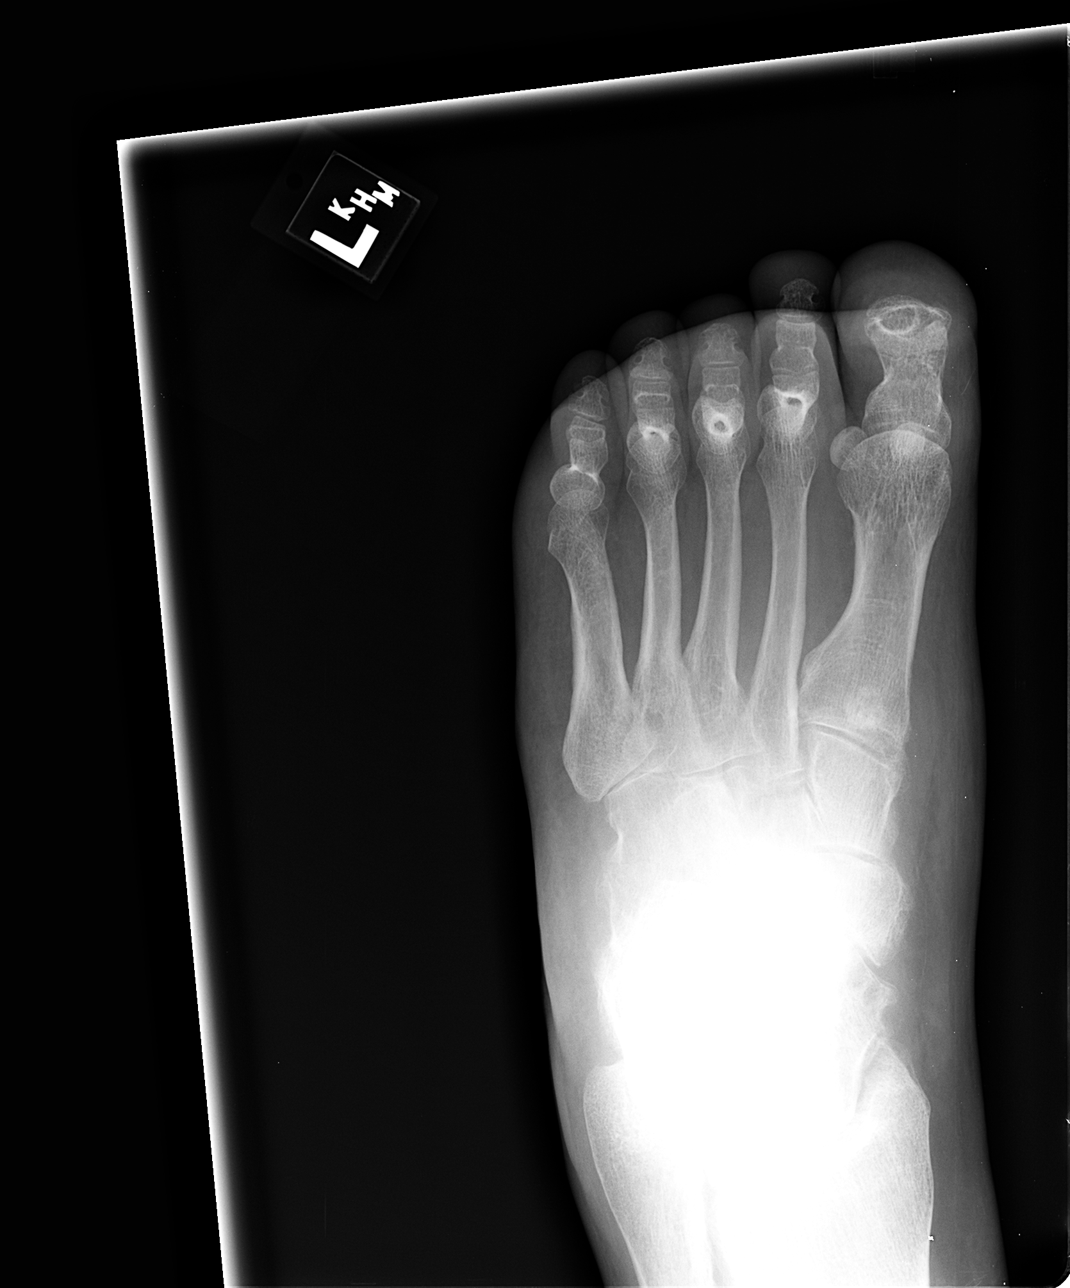

[view not recorded (3 of 3)]
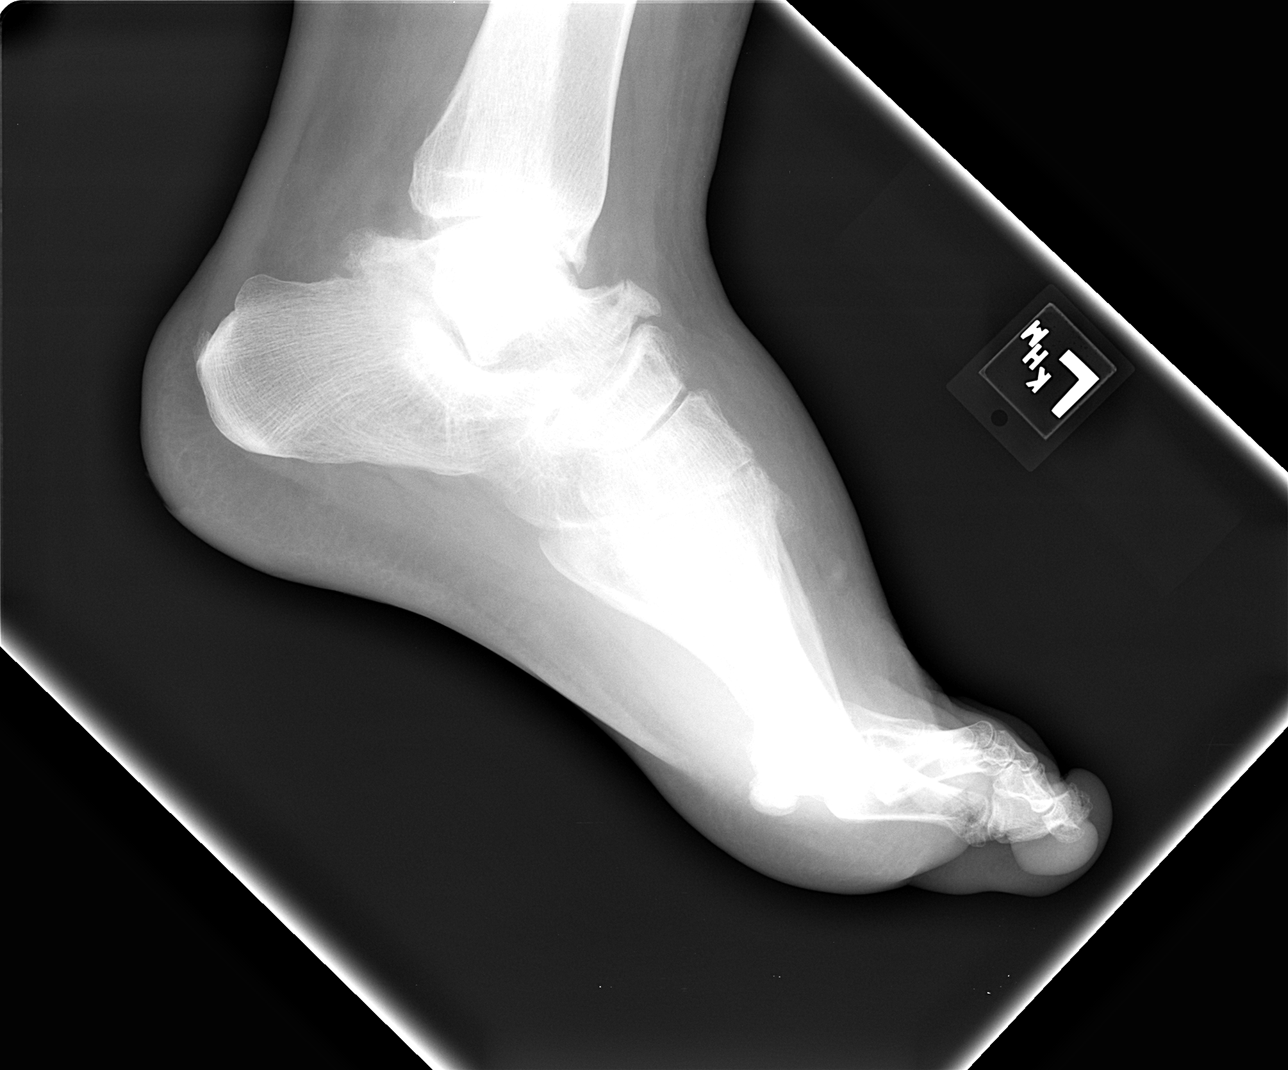

[3 of 3 positions shown; findings below may reference images not displayed]

FINDINGS: Current examination was obtained with the toes in
flexion.  Diffuse soft tissue swelling, increased since prior
examination.  No evidence of acute or subacute fracture or
dislocation.  Prominent talar beak.  Degenerative changes involving
the midfoot.
IMPRESSION: No acute or subacute osseous abnormality.  Osteoarthritis.  Diffuse
soft tissue swelling.

## 2014-07-28 ENCOUNTER — Inpatient Hospital Stay (HOSPITAL_COMMUNITY)
Admission: EM | Admit: 2014-07-28 | Discharge: 2014-07-31 | DRG: 378 | Disposition: A | Discharge status: Home / Self Care | Payer: Medicaid Other | Attending: Internal Medicine | Admitting: Internal Medicine

## 2014-07-28 ENCOUNTER — Encounter (HOSPITAL_COMMUNITY): Payer: Self-pay | Admitting: *Deleted

## 2014-07-28 DIAGNOSIS — M109 Gout, unspecified: Secondary | ICD-10-CM | POA: Diagnosis present

## 2014-07-28 DIAGNOSIS — R1013 Epigastric pain: Secondary | ICD-10-CM

## 2014-07-28 DIAGNOSIS — K625 Hemorrhage of anus and rectum: Principal | ICD-10-CM | POA: Diagnosis present

## 2014-07-28 DIAGNOSIS — K210 Gastro-esophageal reflux disease with esophagitis: Secondary | ICD-10-CM | POA: Diagnosis present

## 2014-07-28 DIAGNOSIS — M25531 Pain in right wrist: Secondary | ICD-10-CM

## 2014-07-28 DIAGNOSIS — Z6841 Body Mass Index (BMI) 40.0 and over, adult: Secondary | ICD-10-CM

## 2014-07-28 DIAGNOSIS — E876 Hypokalemia: Secondary | ICD-10-CM | POA: Diagnosis present

## 2014-07-28 DIAGNOSIS — D72829 Elevated white blood cell count, unspecified: Secondary | ICD-10-CM | POA: Diagnosis present

## 2014-07-28 DIAGNOSIS — R109 Unspecified abdominal pain: Secondary | ICD-10-CM | POA: Insufficient documentation

## 2014-07-28 DIAGNOSIS — K449 Diaphragmatic hernia without obstruction or gangrene: Secondary | ICD-10-CM | POA: Insufficient documentation

## 2014-07-28 DIAGNOSIS — K2210 Ulcer of esophagus without bleeding: Secondary | ICD-10-CM | POA: Diagnosis present

## 2014-07-28 DIAGNOSIS — Z8249 Family history of ischemic heart disease and other diseases of the circulatory system: Secondary | ICD-10-CM

## 2014-07-28 DIAGNOSIS — K922 Gastrointestinal hemorrhage, unspecified: Secondary | ICD-10-CM | POA: Diagnosis present

## 2014-07-28 DIAGNOSIS — K648 Other hemorrhoids: Secondary | ICD-10-CM | POA: Diagnosis present

## 2014-07-28 DIAGNOSIS — M25539 Pain in unspecified wrist: Secondary | ICD-10-CM | POA: Insufficient documentation

## 2014-07-28 DIAGNOSIS — K649 Unspecified hemorrhoids: Secondary | ICD-10-CM | POA: Insufficient documentation

## 2014-07-28 DIAGNOSIS — F17210 Nicotine dependence, cigarettes, uncomplicated: Secondary | ICD-10-CM | POA: Diagnosis present

## 2014-07-28 NOTE — ED Notes (Signed)
Pt reporting generalized abdominal pain and cramping since Sunday.  Reports today symptoms have gotten worse, and noticed blood in stool about 9pm.

## 2014-07-29 ENCOUNTER — Observation Stay (HOSPITAL_COMMUNITY): Payer: Medicaid Other

## 2014-07-29 ENCOUNTER — Encounter (HOSPITAL_COMMUNITY): Payer: Self-pay | Admitting: *Deleted

## 2014-07-29 ENCOUNTER — Emergency Department (HOSPITAL_COMMUNITY): Payer: Medicaid Other

## 2014-07-29 DIAGNOSIS — M25531 Pain in right wrist: Secondary | ICD-10-CM

## 2014-07-29 DIAGNOSIS — R1013 Epigastric pain: Secondary | ICD-10-CM

## 2014-07-29 DIAGNOSIS — K625 Hemorrhage of anus and rectum: Secondary | ICD-10-CM | POA: Diagnosis not present

## 2014-07-29 DIAGNOSIS — E876 Hypokalemia: Secondary | ICD-10-CM

## 2014-07-29 DIAGNOSIS — K208 Other esophagitis: Secondary | ICD-10-CM | POA: Diagnosis not present

## 2014-07-29 DIAGNOSIS — R1030 Lower abdominal pain, unspecified: Secondary | ICD-10-CM | POA: Diagnosis not present

## 2014-07-29 DIAGNOSIS — R109 Unspecified abdominal pain: Secondary | ICD-10-CM | POA: Insufficient documentation

## 2014-07-29 DIAGNOSIS — M25539 Pain in unspecified wrist: Secondary | ICD-10-CM | POA: Insufficient documentation

## 2014-07-29 DIAGNOSIS — K922 Gastrointestinal hemorrhage, unspecified: Secondary | ICD-10-CM | POA: Diagnosis not present

## 2014-07-29 LAB — COMPREHENSIVE METABOLIC PANEL
ALT: 17 U/L (ref 0–53)
AST: 15 U/L (ref 0–37)
Albumin: 3.4 g/dL — ABNORMAL LOW (ref 3.5–5.2)
Alkaline Phosphatase: 66 U/L (ref 39–117)
Anion gap: 8 (ref 5–15)
BUN: 9 mg/dL (ref 6–23)
CALCIUM: 8.7 mg/dL (ref 8.4–10.5)
CO2: 25 mmol/L (ref 19–32)
CREATININE: 0.68 mg/dL (ref 0.50–1.35)
Chloride: 106 mmol/L (ref 96–112)
GFR calc Af Amer: >90 mL/min (ref >90)
GFR calc non Af Amer: >90 mL/min (ref >90)
GLUCOSE: 117 mg/dL — AB (ref 70–99)
Potassium: 3.3 mmol/L — ABNORMAL LOW (ref 3.5–5.1)
SODIUM: 139 mmol/L (ref 135–145)
Total Bilirubin: 0.3 mg/dL (ref 0.3–1.2)
Total Protein: 6.6 g/dL (ref 6.0–8.3)

## 2014-07-29 LAB — APTT: aPTT: 31 s (ref 24–37)

## 2014-07-29 LAB — POC OCCULT BLOOD, ED: Fecal Occult Bld: POSITIVE — AB

## 2014-07-29 LAB — BASIC METABOLIC PANEL
Anion gap: 7 (ref 5–15)
BUN: 8 mg/dL (ref 6–23)
CALCIUM: 8.4 mg/dL (ref 8.4–10.5)
CO2: 26 mmol/L (ref 19–32)
CREATININE: 0.67 mg/dL (ref 0.50–1.35)
Chloride: 108 mmol/L (ref 96–112)
GFR calc Af Amer: >90 mL/min (ref >90)
GFR calc non Af Amer: >90 mL/min (ref >90)
GLUCOSE: 104 mg/dL — AB (ref 70–99)
Potassium: 3.5 mmol/L (ref 3.5–5.1)
SODIUM: 141 mmol/L (ref 135–145)

## 2014-07-29 LAB — CBC WITH DIFFERENTIAL/PLATELET
Basophils Absolute: 0 10*3/uL (ref 0.0–0.1)
Basophils Relative: 0 % (ref 0–1)
Eosinophils Absolute: 0.7 10*3/uL (ref 0.0–0.7)
Eosinophils Relative: 6 % — ABNORMAL HIGH (ref 0–5)
HCT: 42.3 % (ref 39.0–52.0)
Hemoglobin: 14.2 g/dL (ref 13.0–17.0)
LYMPHS PCT: 26 % (ref 12–46)
Lymphs Abs: 3.1 10*3/uL (ref 0.7–4.0)
MCH: 29.2 pg (ref 26.0–34.0)
MCHC: 33.6 g/dL (ref 30.0–36.0)
MCV: 87 fL (ref 78.0–100.0)
MONO ABS: 1 10*3/uL (ref 0.1–1.0)
Monocytes Relative: 9 % (ref 3–12)
NEUTROS PCT: 59 % (ref 43–77)
Neutro Abs: 6.9 10*3/uL (ref 1.7–7.7)
Platelets: 217 10*3/uL (ref 150–400)
RBC: 4.86 MIL/uL (ref 4.22–5.81)
RDW: 13.5 % (ref 11.5–15.5)
WBC: 11.7 10*3/uL — AB (ref 4.0–10.5)

## 2014-07-29 LAB — PROTIME-INR
INR: 0.97 (ref 0.00–1.49)
PROTHROMBIN TIME: 13 s (ref 11.6–15.2)

## 2014-07-29 LAB — LIPASE, BLOOD: Lipase: 23 U/L (ref 11–59)

## 2014-07-29 LAB — CLOSTRIDIUM DIFFICILE BY PCR: CDIFFPCR: NEGATIVE

## 2014-07-29 MED ORDER — IOHEXOL 300 MG/ML  SOLN
50.0000 mL | Freq: Once | INTRAMUSCULAR | Status: AC | PRN
  Administered 2014-07-29: 50 mL via ORAL

## 2014-07-29 MED ORDER — PANTOPRAZOLE SODIUM 40 MG IV SOLR
INTRAVENOUS | Status: AC
  Filled 2014-07-29: qty 80

## 2014-07-29 MED ORDER — SODIUM CHLORIDE 0.9 % IJ SOLN: 3.0000 mL | INTRAMUSCULAR | Status: DC | PRN

## 2014-07-29 MED ORDER — IOHEXOL 300 MG/ML  SOLN
100.0000 mL | Freq: Once | INTRAMUSCULAR | Status: AC | PRN
Start: 2014-07-29 — End: 2014-07-29
  Administered 2014-07-29: 100 mL via INTRAVENOUS

## 2014-07-29 MED ORDER — COLCHICINE 0.6 MG PO TABS
0.6000 mg | ORAL_TABLET | Freq: Every day | ORAL | Status: DC
  Administered 2014-07-29: 0.6 mg via ORAL
  Filled 2014-07-29: qty 1

## 2014-07-29 MED ORDER — COLCHICINE 0.6 MG PO TABS
0.6000 mg | ORAL_TABLET | Freq: Two times a day (BID) | ORAL | Status: DC
  Administered 2014-07-29 – 2014-07-31 (×4): 0.6 mg via ORAL
  Filled 2014-07-29 (×5): qty 1

## 2014-07-29 MED ORDER — HYDROMORPHONE HCL 2 MG/ML IJ SOLN
2.0000 mg | INTRAMUSCULAR | Status: DC | PRN
  Administered 2014-07-29 – 2014-07-30 (×8): 2 mg via INTRAVENOUS
  Filled 2014-07-29 (×8): qty 1

## 2014-07-29 MED ORDER — PEG 3350-KCL-NA BICARB-NACL 420 G PO SOLR
4000.0000 mL | Freq: Once | ORAL | Status: AC
  Administered 2014-07-29: 4000 mL via ORAL
  Filled 2014-07-29: qty 4000

## 2014-07-29 MED ORDER — ALLOPURINOL 300 MG PO TABS
300.0000 mg | ORAL_TABLET | Freq: Every day | ORAL | Status: DC
  Administered 2014-07-29 – 2014-07-31 (×3): 300 mg via ORAL
  Filled 2014-07-29 (×4): qty 1

## 2014-07-29 MED ORDER — GABAPENTIN 300 MG PO CAPS
300.0000 mg | ORAL_CAPSULE | Freq: Three times a day (TID) | ORAL | Status: DC
  Administered 2014-07-29 – 2014-07-31 (×7): 300 mg via ORAL
  Filled 2014-07-29 (×8): qty 1

## 2014-07-29 MED ORDER — POTASSIUM CHLORIDE CRYS ER 20 MEQ PO TBCR
40.0000 meq | EXTENDED_RELEASE_TABLET | Freq: Once | ORAL | Status: AC
  Administered 2014-07-29: 20 meq via ORAL
  Filled 2014-07-29: qty 2

## 2014-07-29 MED ORDER — PANTOPRAZOLE SODIUM 40 MG IV SOLR
INTRAVENOUS | Status: AC
Start: 2014-07-29 — End: 2014-07-29
  Filled 2014-07-29: qty 80

## 2014-07-29 MED ORDER — MORPHINE SULFATE 4 MG/ML IJ SOLN
4.0000 mg | Freq: Once | INTRAMUSCULAR | Status: AC
  Administered 2014-07-29: 4 mg via INTRAVENOUS
  Filled 2014-07-29: qty 1

## 2014-07-29 MED ORDER — SODIUM CHLORIDE 0.9 % IV BOLUS (SEPSIS)
1000.0000 mL | Freq: Once | INTRAVENOUS | Status: AC
  Administered 2014-07-29: 1000 mL via INTRAVENOUS

## 2014-07-29 MED ORDER — PANTOPRAZOLE SODIUM 40 MG IV SOLR
40.0000 mg | Freq: Two times a day (BID) | INTRAVENOUS | Status: DC
Start: 2014-08-01 — End: 2014-07-31

## 2014-07-29 MED ORDER — METRONIDAZOLE 500 MG PO TABS
500.0000 mg | ORAL_TABLET | Freq: Three times a day (TID) | ORAL | Status: DC
  Administered 2014-07-29 – 2014-07-30 (×4): 500 mg via ORAL
  Filled 2014-07-29 (×4): qty 1

## 2014-07-29 MED ORDER — ACETAMINOPHEN 325 MG PO TABS
650.0000 mg | ORAL_TABLET | Freq: Four times a day (QID) | ORAL | Status: DC | PRN
  Administered 2014-07-30 (×2): 650 mg via ORAL
  Filled 2014-07-29 (×3): qty 2

## 2014-07-29 MED ORDER — BENAZEPRIL HCL 10 MG PO TABS
5.0000 mg | ORAL_TABLET | Freq: Every day | ORAL | Status: DC
  Administered 2014-07-29 – 2014-07-31 (×3): 5 mg via ORAL
  Filled 2014-07-29 (×4): qty 1

## 2014-07-29 MED ORDER — ONDANSETRON HCL 4 MG PO TABS: 4.0000 mg | ORAL_TABLET | Freq: Four times a day (QID) | ORAL | Status: DC | PRN

## 2014-07-29 MED ORDER — SODIUM CHLORIDE 0.9 % IV SOLN
8.0000 mg/h | INTRAVENOUS | Status: DC
  Administered 2014-07-29: 8 mg/h via INTRAVENOUS
  Filled 2014-07-29 (×4): qty 80

## 2014-07-29 MED ORDER — SODIUM CHLORIDE 0.9 % IV SOLN: 250.0000 mL | INTRAVENOUS | Status: DC | PRN

## 2014-07-29 MED ORDER — SODIUM CHLORIDE 0.9 % IV SOLN: INTRAVENOUS | Status: DC

## 2014-07-29 MED ORDER — SODIUM CHLORIDE 0.9 % IV SOLN
80.0000 mg | Freq: Once | INTRAVENOUS | Status: AC
  Administered 2014-07-29: 80 mg via INTRAVENOUS
  Filled 2014-07-29: qty 80

## 2014-07-29 MED ORDER — ONDANSETRON HCL 4 MG/2ML IJ SOLN
4.0000 mg | Freq: Four times a day (QID) | INTRAMUSCULAR | Status: DC | PRN
  Administered 2014-07-29 – 2014-07-30 (×3): 4 mg via INTRAVENOUS
  Filled 2014-07-29 (×3): qty 2

## 2014-07-29 MED ORDER — SODIUM CHLORIDE 0.9 % IJ SOLN
3.0000 mL | Freq: Two times a day (BID) | INTRAMUSCULAR | Status: DC
  Administered 2014-07-29 – 2014-07-30 (×3): 3 mL via INTRAVENOUS

## 2014-07-29 MED ORDER — ONDANSETRON HCL 4 MG/2ML IJ SOLN
4.0000 mg | Freq: Once | INTRAMUSCULAR | Status: AC
  Administered 2014-07-29: 4 mg via INTRAVENOUS
  Filled 2014-07-29: qty 2

## 2014-07-29 MED ORDER — ACETAMINOPHEN 650 MG RE SUPP: 650.0000 mg | Freq: Four times a day (QID) | RECTAL | Status: DC | PRN

## 2014-07-29 NOTE — Progress Notes (Addendum)
[facility] PROGRESS NOTE Assessment/Plan: Acute lower GI bleed: - Started on a PPI, GI was consulted. - Her hemoglobin has remained stable. - He had been recently been exposed to clindamycin she finishes treatment, he is complaining of diffuse abdominal pain with bloody diarrhea. Check PCR pending, start Flagyl empirically. Contact precautions.  Erosive esophagitis: - cont PPI.  Hypokalemia: - Resolved supplement.  Leukocytosis: - Concerned about C. difficile, he She has remained afebrile.  Right wrist pain: - With a history of gout, colchicine get up x-ray of the wrist.  Code Status: full Family Communication: none  Disposition Plan: 1-2 days   Consultants:  GI  Procedures:  CXR  Antibiotics:  None  HPI/Subjective: Continues to have multiple bowel movements. Complaining of abdominal pain.  Objective: Filed Vitals:   07/28/14 2349 07/28/14 2353 07/29/14 0453 07/29/14 0622  BP: 149/82  118/58 124/75  Pulse: 81  78 72  Temp: 98.2 F (36.8 C)  97.7 F (36.5 C) 97.5 F (36.4 C)  TempSrc: Oral   Oral  Resp: Height:  (1.702 m)     Weight:  173.274 kg (382 lb)    SpO2: 97%  96% 100%   No intake or output data in the 24 hours ending 07/29/14 1054 Filed Weights   07/28/14 2353  Weight: 173.274 kg (382 lb)    Exam:  General: Alert, awake, oriented x3, in no acute distress.  HEENT: No bruits, no goiter.  Heart: Regular rate and rhythm. Lungs: Good air movement, Clear Abdomen: Soft, diffuse tnderness, nondistended, positive bowel sounds.  Neuro: Grossly intact, nonfocal.   Data Reviewed: Basic Metabolic Panel:  Recent Labs Lab 07/29/14 0024 07/29/14 0618  NA 139 141  K 3.3* 3.5  CL 106 108  CO2 25 26  GLUCOSE 117* 104*  BUN 9 8  CREATININE 0.68 0.67  CALCIUM 8.7 8.4   Liver Function Tests:  Recent Labs Lab 07/29/14 0024  AST 15  ALT 17  ALKPHOS 66  BILITOT 0.3  PROT 6.6  ALBUMIN 3.4*    Recent  Labs Lab 07/29/14 0024  LIPASE 23   No results for input(s): AMMONIA in the last 168 hours. CBC:  Recent Labs Lab 07/29/14 0024  WBC 11.7*  NEUTROABS 6.9  HGB 14.2  HCT 42.3  MCV 87.0  PLT 217   Cardiac Enzymes: No results for input(s): CKTOTAL, CKMB, CKMBINDEX, TROPONINI in the last 168 hours. BNP (last 3 results) No results for input(s): BNP in the last 8760 hours.  ProBNP (last 3 results)  Recent Labs  02/26/14 0750  PROBNP 24.4    CBG: No results for input(s): GLUCAP in the last 168 hours.  No results found for this or any previous visit (from the past 240 hour(s)).   Studies: Ct Abdomen Pelvis W Contrast  07/29/2014   CLINICAL DATA:  Mid abdominal pain for 2 days.  EXAM: CT ABDOMEN AND PELVIS WITH CONTRAST  TECHNIQUE: Multidetector CT imaging of the abdomen and pelvis was performed using the standard protocol following bolus administration of intravenous contrast.  CONTRAST:  100 mL Omnipaque 300 IV  COMPARISON:  CT 02/09/2014  FINDINGS: Tiny subpleural nodule in the left and right lower lobe, unchanged. No consolidation or pleural effusion in the lung bases.  Fatty infiltration of the liver without focal lesion. Gallbladder is decompressed and contains gallstone. Spleen and pancreas are normal. Benign hypodense left adrenal lesion measuring 2.3 cm, Hounsfield units of -10, adenoma versus myelolipoma. Right adrenal  gland is normal. There is no hydronephrosis, perinephric stranding, or focal renal abnormality.  The stomach is distended with ingested contrast. There is no small bowel dilatation or thickening. The appendix is normal. Small volume of colonic stool, no colonic thickening. No bowel obstruction. No free intra-abdominal air, free fluid, or fluid collection. The abdominal aorta is normal in caliber without adenopathy. Small fat containing umbilical hernia.  Within the pelvis the bladder is physiologically distended. Prostate gland is normal in size. Stable bilateral  enlarged inguinal lymph nodes. Stable enlarged bilateral external iliac lymph nodes.  There are no acute or suspicious osseous abnormalities.  IMPRESSION: 1. No acute abnormality in the abdomen/pelvis. 2. Stable chronic findings of hepatic steatosis, cholelithiasis, benign left adrenal lesion and inguinal adenopathy.   Electronically Signed   By: Rubye OaksMelanie  Ehinger M.D.   On: 07/29/2014 02:47    Scheduled Meds: . allopurinol  300 mg Oral Daily  . benazepril  5 mg Oral Daily  . colchicine  0.6 mg Oral Daily  . gabapentin  300 mg Oral TID  . [START ON 08/01/2014] pantoprazole (PROTONIX) IV  40 mg Intravenous Q12H  . sodium chloride  3 mL Intravenous Q12H   Continuous Infusions: . pantoprozole (PROTONIX) infusion 8 mg/hr (07/29/14 0645)     Marinda ElkFELIZ ORTIZ, Landyn Buckalew  [facility] Pager [phone]. If 7PM-7AM, please contact night-coverage at [email or web address], password [email or web address] 07/29/2014, 10:54 AM

## 2014-07-29 NOTE — Progress Notes (Signed)
Patient has started golytely prep, first BM since starting collected and sent to lab for pathogen panel.  Stool soft, loose and brown.  Not watery and no blood visible at this time.

## 2014-07-29 NOTE — Progress Notes (Signed)
UR completed 

## 2014-07-29 NOTE — ED Provider Notes (Addendum)
CSN: [id number]     Arrival date & time 07/28/14  2247 History  This chart was scribed for Steven Racer, MD by Evon Slack, ED Scribe. This patient was seen in room APA17/[facility]17 and the patient's care was started at 12:00 AM.      Chief Complaint  Patient presents with  . Abdominal Pain   The history is provided by the patient. No language interpreter was used.   HPI Comments: [name] is a 29 y.o. male who presents to the Emergency Department complaining of worsening generalized cramping abdominal pain onset 2 days prior. Pt reports diarrhea and blood in stool about 20 min PTA. Pt states that his stool was bright red blood. Pt doesn't report any medications PTA. Pt denies vomiting or other related symptoms. Pt denies being on a blood thinner. Pt reports HX of gastric ulcer with hemorrhage.   Past Medical History  Diagnosis Date  . Gout   . Cellulitis   . Obesity   . Erosive esophagitis 09/01/2012    NSAID induced.  . Gastric ulcer with hemorrhage 09/02/2012    s/p bleeding control tx. Per Dr. Jena Gauss  . Acute blood loss anemia 09/02/2012    S/p 1 unit rbcs  . UTI (lower urinary tract infection)   . Gall stones    Past Surgical History  Procedure Laterality Date  . Tonsillectomy    . Esophagogastroduodenoscopy Left 09/01/2012    [id number] esophageal erosions consistent with erosive reflux esophagitis/Pre-pyloric benign-appearing gastric ulcer with bleeding stigmata status post bleeding control therapy as described above  . Esophagogastroduodenoscopy N/A 12/13/2012    Procedure: ESOPHAGOGASTRODUODENOSCOPY (EGD);  Surgeon: Corbin Ade, MD;  Location: [facility];  Service: Endoscopy;  Laterality: N/A;  11:30   Family History  Problem Relation Age of Onset  . Heart failure Mother    History  Substance Use Topics  . Smoking status: Light Tobacco Smoker -- 0.33 packs/day for 7 years    Types: Cigarettes  . Smokeless tobacco: Current User    Types: Snuff  . Alcohol  Use: No    Review of Systems  Constitutional: Negative for fever and chills.  Respiratory: Negative for cough and shortness of breath.   Cardiovascular: Negative for chest pain.  Gastrointestinal: Positive for abdominal pain, diarrhea and blood in stool. Negative for nausea, vomiting and constipation.  Musculoskeletal: Negative for myalgias, back pain, neck pain and neck stiffness.  Skin: Negative for rash and wound.  Neurological: Negative for dizziness, syncope, weakness, light-headedness, numbness and headaches.  All other systems reviewed and are negative.    Allergies  Asa; Nsaids; Penicillins; and Sulfa antibiotics  Home Medications   Prior to Admission medications   Medication Sig Start Date End Date Taking? Authorizing Provider  gabapentin (NEURONTIN) 300 MG capsule Take 300 mg by mouth 3 (three) times daily.    Yes Historical Provider, MD  albuterol (PROVENTIL HFA;VENTOLIN HFA) 108 (90 BASE) MCG/ACT inhaler Inhale 2 puffs into the lungs every 6 (six) hours as needed for wheezing or shortness of breath. 02/26/14   Glynn Octave, MD  allopurinol (ZYLOPRIM) 300 MG tablet Take 300 mg by mouth daily.    Historical Provider, MD  benazepril (LOTENSIN) 5 MG tablet Take 5 mg by mouth daily.    Historical Provider, MD  colchicine 0.6 MG tablet Take 1 tablet (0.6 mg total) by mouth daily. 03/16/14   Clydia Llano, MD  doxycycline (VIBRA-TABS) 100 MG tablet Take 1 tablet (100 mg total) by mouth 2 (two) times  daily. 03/16/14   Clydia LlanoMutaz Elmahi, MD  oxyCODONE-acetaminophen (ROXICET) 5-325 MG per tablet Take 1 tablet by mouth every 6 (six) hours as needed. 03/16/14   Clydia LlanoMutaz Elmahi, MD  pantoprazole (PROTONIX) 40 MG tablet Take 1 tablet (40 mg total) by mouth daily. 03/16/14   Clydia LlanoMutaz Elmahi, MD  predniSONE (STERAPRED UNI-PAK) 10 MG tablet Take 6-5-4-3-2-1 tablets PO daily till gone 03/16/14   Mutaz Elmahi, MD   BP 124/75 mmHg  Pulse 72  Temp(Src) 97.5 F (36.4 C) (Oral)  Resp 20  Ht 5\' 7"   (1.702 m)  Wt 382 lb (173.274 kg)  BMI 59.82 kg/m2  SpO2 100%   Physical Exam  Constitutional: He is oriented to person, place, and time. He appears well-developed and well-nourished. No distress.  Obese  HENT:  Head: Normocephalic and atraumatic.  Mouth/Throat: Oropharynx is clear and moist.  Eyes: EOM are normal. Pupils are equal, round, and reactive to light.  Neck: Normal range of motion. Neck supple.  Cardiovascular: Normal rate and regular rhythm.   Pulmonary/Chest: Effort normal and breath sounds normal. No respiratory distress. He has no wheezes. He has no rales. He exhibits no tenderness.  Abdominal: Soft. Bowel sounds are normal. He exhibits no distension and no mass. There is tenderness (bilateral lower quadrant tenderness with palpation. There is no rebound or guarding. Patient also has epigastric tenderness with palpation). There is no rebound and no guarding.  Genitourinary: Guaiac positive stool.  No obvious external or internal hemorrhoids. Brown stool on glove. Overtly heme positive  Musculoskeletal: Normal range of motion. He exhibits no edema or tenderness.  No CVA tenderness bilaterally.  Neurological: He is alert and oriented to person, place, and time.  Moves all extremities without deficit. Sensation is grossly intact.  Skin: Skin is warm and dry. No rash noted. No erythema.  Psychiatric: He has a normal mood and affect. His behavior is normal.  Nursing note and vitals reviewed.   ED Course  Procedures (including critical care time) DIAGNOSTIC STUDIES: Oxygen Saturation is 97% on RA, normal by my interpretation.    COORDINATION OF CARE: 12:22 AM-Discussed treatment plan with pt at bedside and pt agreed to plan.     Labs Review Labs Reviewed  COMPREHENSIVE METABOLIC PANEL - Abnormal; Notable for the following:    Potassium 3.3 (*)    Glucose, Bld 117 (*)    Albumin 3.4 (*)    All other components within normal limits  CBC WITH DIFFERENTIAL/PLATELET -  Abnormal; Notable for the following:    WBC 11.7 (*)    Eosinophils Relative 6 (*)    All other components within normal limits  POC OCCULT BLOOD, ED - Abnormal; Notable for the following:    Fecal Occult Bld POSITIVE (*)    All other components within normal limits  LIPASE, BLOOD  PROTIME-INR  APTT  OCCULT BLOOD X 1 CARD TO LAB, STOOL  BASIC METABOLIC PANEL    Imaging Review No results found.   EKG Interpretation None      MDM   Final diagnoses:  Abdominal pain  Gastrointestinal hemorrhage, unspecified gastritis, unspecified gastrointestinal hemorrhage type      Patient with stable vital signs and stable hemoglobin in the emergency department. History of gastric ulcer with bleeding. Presents with epigastric and lower abdominal pain and GI bleeding. Stool is heme positive. CT without any acute findings. Given protonix IV in the emergency department. No further bleeding noted. Discussed with hospitalist and we'll admit for observation.     Onalee Huaavid  Ranae Palms, MD 07/29/14 [phone]  I personally performed the services described in this documentation, which was scribed in my presence. The recorded information has been reviewed and is accurate.    Steven Racer, MD 07/29/14 [phone]

## 2014-07-29 NOTE — Consult Note (Signed)
Reason for Consult: [place] Referring Physician: Hospitalist  [name] is an 29 y.o. male.  HPI: Admitted thru the ED with rectal bleeding. Rectal bleeding ? Started Monday. Really noticed last night.  Since Sunday, he states he had stomach cramps. Monday he had five loose stools but he did not notice if there was any blood in his stools. Last night, He says he had severe abdominal pain.  He noticed blood on the toilet tissue and on his hand. He describes the blood as burgundy in color and bright red. No BM since admission but did have a bloody stool in the ED. Hx of gastric ulcers. Underwent an EGD x 2 in 2014. See below. States in 2014 he had been taking 5-10 Aleve a day for gout and arthritis. Hx of Gout and arthritis, obesity. Today he continues to have cramping in lower abdomen.  Nausea but no vomiting.  Dental procedure  5 days. Had been on Clindamycin 1 week prior to dental procedure (tooth extraction)    EGD 12/08/2012 INDICATIONS: History of complicated gastric ulcer disease   MPRESSION: Hiatal hernia. Previously noted gastric ulcer completely healed  5./18/2014 EGD:  Bedside ICU EGD with bleeding control therapy.  INDICATIONS FOR PROCEDURE: A 29 year old, morbidly obese gentleman with recent history of high-dose NSAID therapy for gout presented with hematemesis today. Bedside ICU EGD now being done. Risks, benefits, limitations, alternatives, and imponderables have been discussed. Please see the documentation medical record.  IMPRESSION: 1. Distal esophageal erosions consistent with erosive reflux  esophagitis. 2. Pre-pyloric benign-appearing gastric ulcer with bleeding stigmata  status post bleeding control therapy as described above.       Past Medical History  Diagnosis Date  . Gout   . Cellulitis   . Obesity   . Erosive esophagitis 09/01/2012    NSAID induced.  . Gastric ulcer with hemorrhage 09/02/2012    s/p bleeding control tx. Per  Dr. Gala Romney  . Acute blood loss anemia 09/02/2012    S/p 1 unit rbcs  . UTI (lower urinary tract infection)   . Gall stones     Past Surgical History  Procedure Laterality Date  . Tonsillectomy    . Esophagogastroduodenoscopy Left 09/01/2012    [id number] esophageal erosions consistent with erosive reflux esophagitis/Pre-pyloric benign-appearing gastric ulcer with bleeding stigmata status post bleeding control therapy as described above  . Esophagogastroduodenoscopy N/A 12/13/2012    Procedure: ESOPHAGOGASTRODUODENOSCOPY (EGD);  Surgeon: Daneil Dolin, MD;  Location: [facility];  Service: Endoscopy;  Laterality: N/A;  11:30    Family History  Problem Relation Age of Onset  . Heart failure Mother     Social History:  reports that he has been smoking Cigarettes.  He has a 2.31 pack-year smoking history. His smokeless tobacco use includes Snuff. He reports that he uses illicit drugs (Marijuana). He reports that he does not drink alcohol.  Allergies:  Allergies  Allergen Reactions  . Asa [Aspirin] Other (See Comments)    Pt doesn't know reaction.  . Nsaids Other (See Comments)    Causes Ulcers   . Penicillins Other (See Comments)    Pt doesn't know reaction.  . Sulfa Antibiotics Other (See Comments)    Pt doesn't know reaction.    Medications: I have reviewed the patient's current medications.  Results for orders placed or performed during the hospital encounter of 07/28/14 (from the past 48 hour(s))  Comprehensive metabolic panel     Status: Abnormal   Collection Time: 07/29/14 12:24 AM  Result Value Ref Range   Sodium 139 135 - 145 mmol/L   Potassium 3.3 (L) 3.5 - 5.1 mmol/L   Chloride 106 96 - 112 mmol/L   CO2 25 19 - 32 mmol/L   Glucose, Bld 117 (H) 70 - 99 mg/dL   BUN 9 6 - 23 mg/dL   Creatinine, Ser 0.68 0.50 - 1.35 mg/dL   Calcium 8.7 8.4 - 10.5 mg/dL   Total Protein 6.6 6.0 - 8.3 g/dL   Albumin 3.4 (L) 3.5 - 5.2 g/dL   AST 15 0 - 37 U/L   ALT 17 0 - 53 U/L    Alkaline Phosphatase 66 39 - 117 U/L   Total Bilirubin 0.3 0.3 - 1.2 mg/dL   GFR calc non Af Amer >90 >90 mL/min   GFR calc Af Amer >90 >90 mL/min    Comment: (NOTE) The eGFR has been calculated using the CKD EPI equation. This calculation has not been validated in all clinical situations. eGFR's persistently <90 mL/min signify possible Chronic Kidney Disease.    Anion gap 8 5 - 15  Lipase, blood     Status: None   Collection Time: 07/29/14 12:24 AM  Result Value Ref Range   Lipase 23 11 - 59 U/L  CBC with Differential     Status: Abnormal   Collection Time: 07/29/14 12:24 AM  Result Value Ref Range   WBC 11.7 (H) 4.0 - 10.5 K/uL   RBC 4.86 4.22 - 5.81 MIL/uL   Hemoglobin 14.2 13.0 - 17.0 g/dL   HCT 42.3 39.0 - 52.0 %   MCV 87.0 78.0 - 100.0 fL   MCH 29.2 26.0 - 34.0 pg   MCHC 33.6 30.0 - 36.0 g/dL   RDW 13.5 11.5 - 15.5 %   Platelets 217 150 - 400 K/uL   Neutrophils Relative % 59 43 - 77 %   Neutro Abs 6.9 1.7 - 7.7 K/uL   Lymphocytes Relative 26 12 - 46 %   Lymphs Abs 3.1 0.7 - 4.0 K/uL   Monocytes Relative 9 3 - 12 %   Monocytes Absolute 1.0 0.1 - 1.0 K/uL   Eosinophils Relative 6 (H) 0 - 5 %   Eosinophils Absolute 0.7 0.0 - 0.7 K/uL   Basophils Relative 0 0 - 1 %   Basophils Absolute 0.0 0.0 - 0.1 K/uL  Protime-INR     Status: None   Collection Time: 07/29/14 12:24 AM  Result Value Ref Range   Prothrombin Time 13.0 11.6 - 15.2 seconds   INR 0.97 0.00 - 1.49  APTT     Status: None   Collection Time: 07/29/14 12:24 AM  Result Value Ref Range   aPTT 31 24 - 37 seconds  POC occult blood, ED     Status: Abnormal   Collection Time: 07/29/14 12:42 AM  Result Value Ref Range   Fecal Occult Bld POSITIVE (A) NEGATIVE  Basic metabolic panel     Status: Abnormal   Collection Time: 07/29/14  6:18 AM  Result Value Ref Range   Sodium 141 135 - 145 mmol/L   Potassium 3.5 3.5 - 5.1 mmol/L   Chloride 108 96 - 112 mmol/L   CO2 26 19 - 32 mmol/L   Glucose, Bld 104 (H) 70 - 99  mg/dL   BUN 8 6 - 23 mg/dL   Creatinine, Ser 0.67 0.50 - 1.35 mg/dL   Calcium 8.4 8.4 - 10.5 mg/dL   GFR calc non Af Amer >90 >90 mL/min  GFR calc Af Amer >90 >90 mL/min    Comment: (NOTE) The eGFR has been calculated using the CKD EPI equation. This calculation has not been validated in all clinical situations. eGFR's persistently <90 mL/min signify possible Chronic Kidney Disease.    Anion gap 7 5 - 15    Ct Abdomen Pelvis W Contrast  07/29/2014   CLINICAL DATA:  Mid abdominal pain for 2 days.  EXAM: CT ABDOMEN AND PELVIS WITH CONTRAST  TECHNIQUE: Multidetector CT imaging of the abdomen and pelvis was performed using the standard protocol following bolus administration of intravenous contrast.  CONTRAST:  100 mL Omnipaque 300 IV  COMPARISON:  CT 02/09/2014  FINDINGS: Tiny subpleural nodule in the left and right lower lobe, unchanged. No consolidation or pleural effusion in the lung bases.  Fatty infiltration of the liver without focal lesion. Gallbladder is decompressed and contains gallstone. Spleen and pancreas are normal. Benign hypodense left adrenal lesion measuring 2.3 cm, Hounsfield units of -10, adenoma versus myelolipoma. Right adrenal gland is normal. There is no hydronephrosis, perinephric stranding, or focal renal abnormality.  The stomach is distended with ingested contrast. There is no small bowel dilatation or thickening. The appendix is normal. Small volume of colonic stool, no colonic thickening. No bowel obstruction. No free intra-abdominal air, free fluid, or fluid collection. The abdominal aorta is normal in caliber without adenopathy. Small fat containing umbilical hernia.  Within the pelvis the bladder is physiologically distended. Prostate gland is normal in size. Stable bilateral enlarged inguinal lymph nodes. Stable enlarged bilateral external iliac lymph nodes.  There are no acute or suspicious osseous abnormalities.  IMPRESSION: 1. No acute abnormality in the  abdomen/pelvis. 2. Stable chronic findings of hepatic steatosis, cholelithiasis, benign left adrenal lesion and inguinal adenopathy.   Electronically Signed   By: Jeb Levering M.D.   On: 07/29/2014 02:47    ROS Blood pressure 124/75, pulse 72, temperature 97.5 F (36.4 C), temperature source Oral, resp. rate 20, height _0  (1.702 m), weight 382 lb (173.274 kg), SpO2 100 %. Physical Exam Alert and oriented. Skin warm and dry. Oral mucosa is moist.   . Sclera anicteric, conjunctivae is pink. Thyroid not enlarged. No cervical lymphadenopathy. Lungs clear. Heart regular rate and rhythm.  Abdomen is soft. Bowel sounds are positive. No hepatomegaly. No abdominal masses felt. Lower abdominal tenderness. Morbidly obese.     Assessment/Plan: Rectal bleeding.? Antibiotic induced. C-diff needs to be ruled out. Plan: GI pathogen. I will discuss with Dr. Rosalin Hawking 07/29/2014, 7:46 AM     GI attending note; Patient interviewed and examined. Records reviewed. Patient presents with large-volume hematochezia but H&H is normal.  He hasn't had any bowel movements since last night. Patient was on clindamycin until 5 days ago for dental problems that he hasn't had fever or significant diarrhea. His illness is not suggestive of C. difficile colitis. Will prep patient for colonoscopy to be performed tomorrow by Dr. Gala Romney under monitored anesthesia care. If stools turned out to be positive for C. difficile will postpone colonoscopy.

## 2014-07-29 NOTE — Care Management Note (Addendum)
    Page 1 of 1   07/31/2014     10:23:01 AM CARE MANAGEMENT NOTE 07/31/2014  Patient:  [name],[name]   Account Number:  [id number]  Date Initiated:  07/29/2014  Documentation initiated by:  Kathyrn SheriffHILDRESS,JESSICA  Subjective/Objective Assessment:   Pt is from home, lives with family. Pt admitted for ? gi bleed/c-diff. Pt indepdnent at baseline with no HH services or DME's prior to admission. Pt plans to discharge home with self care. No CM needs anticipated.     Action/Plan:   Anticipated DC Date:  08/01/2014   Anticipated DC Plan:  HOME/SELF CARE      DC Planning Services  CM consult      Choice offered to / List presented to:             Status of service:  Completed, signed off Medicare Important Message given?   (If response is "NO", the following Medicare IM given date fields will be blank) Date Medicare IM given:   Medicare IM given by:   Date Additional Medicare IM given:   Additional Medicare IM given by:    Discharge Disposition:  HOME/SELF CARE  Per UR Regulation:  Reviewed for med. necessity/level of care/duration of stay  If discussed at Long Length of Stay Meetings, dates discussed:    Comments:  07/31/2014 1022 Kathyrn SheriffJessica Childress, RN, MSN, CM Pt being discharged home today. No CM needs. 07/29/2014 1300 Kathyrn SheriffJessica Childress, RN, MSN, CM

## 2014-07-29 NOTE — H&P (Signed)
PCP:   HASANAJ,XAJE A, MD   Chief Complaint:  Rectal bleed  HPI: 29 year old male who   has a past medical history of Gout; Cellulitis; Obesity; Erosive esophagitis (09/01/2012); Gastric ulcer with hemorrhage (09/02/2012); Acute blood loss anemia (09/02/2012); UTI (lower urinary tract infection); and Gall stones. Today presents to the hospital with chief complaint of rectal bleeding. Patient has a history of gastric ulcer with hemorrhage, and has been having loose stools the past 2 days. Today patient noticed that there was bright red blood in the stool today. He also complains of abdominal pain which has been going on for past 2 days. He admits to having nausea but no vomiting. Denies any fever, no dysuria, no chest pain or shortness of breath at this time. In the ED patient's hemoglobin is found to be stable, stool for occult blood done by the ED physician was positive. Patient started on IV Protonix infusion.  Allergies:   Allergies  Allergen Reactions  . Asa [Aspirin] Other (See Comments)    Pt doesn't know reaction.  . Nsaids Other (See Comments)    Causes Ulcers   . Penicillins Other (See Comments)    Pt doesn't know reaction.  . Sulfa Antibiotics Other (See Comments)    Pt doesn't know reaction.      Past Medical History  Diagnosis Date  . Gout   . Cellulitis   . Obesity   . Erosive esophagitis 09/01/2012    NSAID induced.  . Gastric ulcer with hemorrhage 09/02/2012    s/p bleeding control tx. Per Dr. Jena Gaussourk  . Acute blood loss anemia 09/02/2012    S/p 1 unit rbcs  . UTI (lower urinary tract infection)   . Gall stones     Past Surgical History  Procedure Laterality Date  . Tonsillectomy    . Esophagogastroduodenoscopy Left 09/01/2012    GNF:AOZHYQRMR:Distal esophageal erosions consistent with erosive reflux esophagitis/Pre-pyloric benign-appearing gastric ulcer with bleeding stigmata status post bleeding control therapy as described above  . Esophagogastroduodenoscopy N/A  12/13/2012    Procedure: ESOPHAGOGASTRODUODENOSCOPY (EGD);  Surgeon: Corbin Adeobert M Rourk, MD;  Location: [facility];  Service: Endoscopy;  Laterality: N/A;  11:30    Prior to Admission medications   Medication Sig Start Date End Date Taking? Authorizing Provider  albuterol (PROVENTIL HFA;VENTOLIN HFA) 108 (90 BASE) MCG/ACT inhaler Inhale 2 puffs into the lungs every 6 (six) hours as needed for wheezing or shortness of breath. 02/26/14   Glynn OctaveStephen Rancour, MD  allopurinol (ZYLOPRIM) 300 MG tablet Take 300 mg by mouth daily.    Historical Provider, MD  benazepril (LOTENSIN) 5 MG tablet Take 5 mg by mouth daily.    Historical Provider, MD  colchicine 0.6 MG tablet Take 1 tablet (0.6 mg total) by mouth daily. 03/16/14   Clydia LlanoMutaz Elmahi, MD  doxycycline (VIBRA-TABS) 100 MG tablet Take 1 tablet (100 mg total) by mouth 2 (two) times daily. 03/16/14   Clydia LlanoMutaz Elmahi, MD  gabapentin (NEURONTIN) 300 MG capsule Take 300 mg by mouth 3 (three) times daily.     Historical Provider, MD  oxyCODONE-acetaminophen (ROXICET) 5-325 MG per tablet Take 1 tablet by mouth every 6 (six) hours as needed. 03/16/14   Clydia LlanoMutaz Elmahi, MD  pantoprazole (PROTONIX) 40 MG tablet Take 1 tablet (40 mg total) by mouth daily. 03/16/14   Clydia LlanoMutaz Elmahi, MD  predniSONE (STERAPRED UNI-PAK) 10 MG tablet Take 6-5-4-3-2-1 tablets PO daily till gone 03/16/14   Clydia LlanoMutaz Elmahi, MD    Social History:  reports that  he has been smoking Cigarettes.  He has a 1.75 pack-year smoking history. His smokeless tobacco use includes Snuff. He reports that he does not drink alcohol or use illicit drugs.  Family History  Problem Relation Age of Onset  . Heart failure Mother      All the positives are listed in BOLD  Review of Systems:  HEENT: Headache, blurred vision, runny nose, sore throat Neck: Hypothyroidism, hyperthyroidism,,lymphadenopathy Chest : Shortness of breath, history of COPD, Asthma Heart : Chest pain, history of coronary arterey disease GI:   Nausea, vomiting, diarrhea, constipation, GERD GU: Dysuria, urgency, frequency of urination, hematuria Neuro: Stroke, seizures, syncope Psych: Depression, anxiety, hallucinations   Physical Exam: Blood pressure 149/82, pulse 81, temperature 98.2 F (36.8 C), temperature source Oral, resp. rate 18, height  (1.702 m), weight 173.274 kg (382 lb), SpO2 97 %. Constitutional:   Patient is a morbidly obese male* in no acute distress and cooperative with exam. Head: Normocephalic and atraumatic Mouth: Mucus membranes moist Eyes: PERRL, EOMI, conjunctivae normal Neck: Supple, No Thyromegaly Cardiovascular: RRR, S1 normal, S2 normal Pulmonary/Chest: CTAB, no wheezes, rales, or rhonchi Abdominal: Soft. Positive epigastric tenderness, non-distended, bowel sounds are normal, no masses, organomegaly, or guarding present.  Neurological: A&O x3, Strength is normal and symmetric bilaterally, cranial nerve II-XII are grossly intact, no focal motor deficit, sensory intact to light touch bilaterally.  Extremities : No Cyanosis, Clubbing or Edema  Labs on Admission:  Basic Metabolic Panel:  Recent Labs Lab 07/29/14 0024  NA 139  K 3.3*  CL 106  CO2 25  GLUCOSE 117*  BUN 9  CREATININE 0.68  CALCIUM 8.7   Liver Function Tests:  Recent Labs Lab 07/29/14 0024  AST 15  ALT 17  ALKPHOS 66  BILITOT 0.3  PROT 6.6  ALBUMIN 3.4*    Recent Labs Lab 07/29/14 0024  LIPASE 23   No results for input(s): AMMONIA in the last 168 hours. CBC:  Recent Labs Lab 07/29/14 0024  WBC 11.7*  NEUTROABS 6.9  HGB 14.2  HCT 42.3  MCV 87.0  PLT 217      Recent Labs  02/26/14 0750  PROBNP 24.4       Assessment/Plan Active Problems:   Hypokalemia   Erosive esophagitis   Rectal bleed  Rectal bleeding ? Cause, will check H&H every 8 hours. Patient's hemoglobin is stable at this time. Consult GI in a.m. for possible endoscopy.  History of gastric ulcer with hemorrhage Patient  started on IV Protonix infusion.  History of gout Continue colchicine  Diarrhea Will check stool culture, stool for C. Difficile  Code status: Full code  Family discussion: No family at bedside   Time Spent on Admission: 50 minutes  Nyiesha Beever S [facility] Pager: [phone] 07/29/2014, 4:09 AM  If 7PM-7AM, please contact night-coverage  [email or web address]  Password [email or web address]

## 2014-07-30 ENCOUNTER — Encounter (HOSPITAL_COMMUNITY): Payer: Self-pay

## 2014-07-30 ENCOUNTER — Observation Stay (HOSPITAL_COMMUNITY): Payer: Medicaid Other | Admitting: Anesthesiology

## 2014-07-30 ENCOUNTER — Encounter (HOSPITAL_COMMUNITY)
Admission: EM | Disposition: A | Discharge status: Home / Self Care | Payer: Self-pay | Source: Home / Self Care | Attending: Internal Medicine

## 2014-07-30 DIAGNOSIS — M10031 Idiopathic gout, right wrist: Secondary | ICD-10-CM

## 2014-07-30 DIAGNOSIS — Z6841 Body Mass Index (BMI) 40.0 and over, adult: Secondary | ICD-10-CM | POA: Diagnosis not present

## 2014-07-30 DIAGNOSIS — K922 Gastrointestinal hemorrhage, unspecified: Secondary | ICD-10-CM | POA: Diagnosis not present

## 2014-07-30 DIAGNOSIS — K625 Hemorrhage of anus and rectum: Secondary | ICD-10-CM | POA: Diagnosis present

## 2014-07-30 DIAGNOSIS — K649 Unspecified hemorrhoids: Secondary | ICD-10-CM | POA: Insufficient documentation

## 2014-07-30 DIAGNOSIS — K921 Melena: Secondary | ICD-10-CM | POA: Diagnosis not present

## 2014-07-30 DIAGNOSIS — F17210 Nicotine dependence, cigarettes, uncomplicated: Secondary | ICD-10-CM | POA: Diagnosis present

## 2014-07-30 DIAGNOSIS — M25531 Pain in right wrist: Secondary | ICD-10-CM | POA: Diagnosis not present

## 2014-07-30 DIAGNOSIS — K449 Diaphragmatic hernia without obstruction or gangrene: Secondary | ICD-10-CM | POA: Insufficient documentation

## 2014-07-30 DIAGNOSIS — K208 Other esophagitis: Secondary | ICD-10-CM | POA: Diagnosis not present

## 2014-07-30 DIAGNOSIS — K210 Gastro-esophageal reflux disease with esophagitis: Secondary | ICD-10-CM | POA: Diagnosis present

## 2014-07-30 DIAGNOSIS — M109 Gout, unspecified: Secondary | ICD-10-CM | POA: Diagnosis present

## 2014-07-30 DIAGNOSIS — E876 Hypokalemia: Secondary | ICD-10-CM | POA: Diagnosis present

## 2014-07-30 DIAGNOSIS — Z8249 Family history of ischemic heart disease and other diseases of the circulatory system: Secondary | ICD-10-CM | POA: Diagnosis not present

## 2014-07-30 DIAGNOSIS — D72829 Elevated white blood cell count, unspecified: Secondary | ICD-10-CM | POA: Diagnosis present

## 2014-07-30 DIAGNOSIS — K648 Other hemorrhoids: Secondary | ICD-10-CM | POA: Diagnosis present

## 2014-07-30 HISTORY — PX: ESOPHAGOGASTRODUODENOSCOPY (EGD) WITH PROPOFOL: SHX5813

## 2014-07-30 HISTORY — PX: COLONOSCOPY WITH PROPOFOL: SHX5780

## 2014-07-30 LAB — CBC
HEMATOCRIT: 40.8 % (ref 39.0–52.0)
Hemoglobin: 13.4 g/dL (ref 13.0–17.0)
MCH: 29.2 pg (ref 26.0–34.0)
MCHC: 32.8 g/dL (ref 30.0–36.0)
MCV: 88.9 fL (ref 78.0–100.0)
Platelets: 221 10*3/uL (ref 150–400)
RBC: 4.59 MIL/uL (ref 4.22–5.81)
RDW: 13.5 % (ref 11.5–15.5)
WBC: 11.7 10*3/uL — ABNORMAL HIGH (ref 4.0–10.5)

## 2014-07-30 SURGERY — COLONOSCOPY WITH PROPOFOL
Anesthesia: Monitor Anesthesia Care | Site: Esophagus

## 2014-07-30 MED ORDER — PROPOFOL 10 MG/ML IV BOLUS
INTRAVENOUS | Status: AC
  Filled 2014-07-30: qty 20

## 2014-07-30 MED ORDER — ONDANSETRON HCL 4 MG/2ML IJ SOLN
INTRAMUSCULAR | Status: AC
  Filled 2014-07-30: qty 2

## 2014-07-30 MED ORDER — LIDOCAINE VISCOUS 2 % MT SOLN
2.5000 mL | Freq: Once | OROMUCOSAL | Status: AC
  Administered 2014-07-30: 2.5 mL via OROMUCOSAL

## 2014-07-30 MED ORDER — MIDAZOLAM HCL 2 MG/2ML IJ SOLN
INTRAMUSCULAR | Status: AC
Start: 2014-07-30 — End: 2014-07-30
  Filled 2014-07-30: qty 2

## 2014-07-30 MED ORDER — FENTANYL CITRATE 0.05 MG/ML IJ SOLN
INTRAMUSCULAR | Status: AC
  Filled 2014-07-30: qty 2

## 2014-07-30 MED ORDER — HYDROCODONE-ACETAMINOPHEN 5-325 MG PO TABS
1.0000 | ORAL_TABLET | Freq: Four times a day (QID) | ORAL | Status: DC | PRN
  Administered 2014-07-30 – 2014-07-31 (×3): 1 via ORAL
  Filled 2014-07-30 (×4): qty 1

## 2014-07-30 MED ORDER — ONDANSETRON HCL 4 MG/2ML IJ SOLN: 4.0000 mg | Freq: Once | INTRAMUSCULAR | Status: DC | PRN

## 2014-07-30 MED ORDER — LIDOCAINE HCL (CARDIAC) 10 MG/ML IV SOLN
INTRAVENOUS | Status: DC | PRN
  Administered 2014-07-30: 50 mg via INTRAVENOUS

## 2014-07-30 MED ORDER — STERILE WATER FOR IRRIGATION IR SOLN
Status: DC | PRN
  Administered 2014-07-30: 1000 mL

## 2014-07-30 MED ORDER — MIDAZOLAM HCL 2 MG/2ML IJ SOLN
1.0000 mg | INTRAMUSCULAR | Status: AC | PRN
Start: 2014-07-30 — End: 2014-07-30
  Administered 2014-07-30 (×2): 1 mg via INTRAVENOUS
  Administered 2014-07-30: 2 mg via INTRAVENOUS

## 2014-07-30 MED ORDER — LIDOCAINE HCL (PF) 1 % IJ SOLN
INTRAMUSCULAR | Status: AC
  Filled 2014-07-30: qty 5

## 2014-07-30 MED ORDER — ONDANSETRON HCL 4 MG/2ML IJ SOLN
4.0000 mg | Freq: Once | INTRAMUSCULAR | Status: AC
  Administered 2014-07-30: 4 mg via INTRAVENOUS

## 2014-07-30 MED ORDER — FENTANYL CITRATE 0.05 MG/ML IJ SOLN: 25.0000 ug | INTRAMUSCULAR | Status: DC | PRN

## 2014-07-30 MED ORDER — LACTATED RINGERS IV SOLN
INTRAVENOUS | Status: DC
  Administered 2014-07-30: 11:00:00 via INTRAVENOUS

## 2014-07-30 MED ORDER — METHYLPREDNISOLONE SODIUM SUCC 40 MG IJ SOLR
40.0000 mg | Freq: Two times a day (BID) | INTRAMUSCULAR | Status: DC
  Administered 2014-07-30 – 2014-07-31 (×2): 40 mg via INTRAVENOUS
  Filled 2014-07-30 (×2): qty 1

## 2014-07-30 MED ORDER — WATER FOR IRRIGATION, STERILE IR SOLN
Status: DC | PRN
  Administered 2014-07-30: 1000 mL via SURGICAL_CAVITY

## 2014-07-30 MED ORDER — PROPOFOL 10 MG/ML IV BOLUS
INTRAVENOUS | Status: DC | PRN
  Administered 2014-07-30: 17 mg via INTRAVENOUS

## 2014-07-30 MED ORDER — LIDOCAINE VISCOUS 2 % MT SOLN
OROMUCOSAL | Status: AC
  Filled 2014-07-30: qty 15

## 2014-07-30 MED ORDER — FENTANYL CITRATE 0.05 MG/ML IJ SOLN
25.0000 ug | INTRAMUSCULAR | Status: AC
  Administered 2014-07-30 (×2): 25 ug via INTRAVENOUS

## 2014-07-30 MED ORDER — PROPOFOL INFUSION 10 MG/ML OPTIME
INTRAVENOUS | Status: DC | PRN
  Administered 2014-07-30: 100 ug/kg/min via INTRAVENOUS
  Administered 2014-07-30 (×2): 50 ug/kg/min via INTRAVENOUS

## 2014-07-30 MED ORDER — GLYCOPYRROLATE 0.2 MG/ML IJ SOLN
INTRAMUSCULAR | Status: AC
  Filled 2014-07-30: qty 1

## 2014-07-30 MED ORDER — GLYCOPYRROLATE 0.2 MG/ML IJ SOLN
0.2000 mg | Freq: Once | INTRAMUSCULAR | Status: AC
  Administered 2014-07-30: 0.2 mg via INTRAVENOUS

## 2014-07-30 MED ORDER — LACTATED RINGERS IV SOLN
INTRAVENOUS | Status: DC | PRN
  Administered 2014-07-30: 10:00:00 via INTRAVENOUS

## 2014-07-30 MED ORDER — HYDROCORTISONE ACETATE 25 MG RE SUPP
25.0000 mg | Freq: Two times a day (BID) | RECTAL | Status: DC
  Administered 2014-07-30 (×2): 25 mg via RECTAL
  Filled 2014-07-30 (×5): qty 1

## 2014-07-30 SURGICAL SUPPLY — 23 items
ELECT REM PT RETURN 9FT ADLT (ELECTROSURGICAL)
ELECTRODE REM PT RTRN 9FT ADLT (ELECTROSURGICAL) IMPLANT
FCP BXJMBJMB 240X2.8X (CUTTING FORCEPS)
FLOOR PAD 36X40 (MISCELLANEOUS) ×4
FORCEPS BIOP RAD 4 LRG CAP 4 (CUTTING FORCEPS) ×4 IMPLANT
FORCEPS BIOP RJ4 240 W/NDL (CUTTING FORCEPS)
FORCEPS BXJMBJMB 240X2.8X (CUTTING FORCEPS) IMPLANT
FORMALIN 10 PREFIL 20ML (MISCELLANEOUS) IMPLANT
INJECTOR/SNARE I SNARE (MISCELLANEOUS) IMPLANT
KIT CLEAN ENDO COMPLIANCE (KITS) ×4 IMPLANT
LUBRICANT JELLY 4.5OZ STERILE (MISCELLANEOUS) ×4 IMPLANT
MANIFOLD NEPTUNE II (INSTRUMENTS) ×4 IMPLANT
NEEDLE SCLEROTHERAPY 25GX240 (NEEDLE) IMPLANT
PAD FLOOR 36X40 (MISCELLANEOUS) ×2 IMPLANT
PROBE APC STR FIRE (PROBE) IMPLANT
PROBE INJECTION GOLD (MISCELLANEOUS)
PROBE INJECTION GOLD 7FR (MISCELLANEOUS) IMPLANT
SNARE ROTATE MED OVAL 20MM (MISCELLANEOUS) IMPLANT
SNARE SHORT THROW 13M SML OVAL (MISCELLANEOUS) IMPLANT
SYR 50ML LL SCALE MARK (SYRINGE) ×4 IMPLANT
TRAP SPECIMEN MUCOUS 40CC (MISCELLANEOUS) IMPLANT
TUBING IRRIGATION ENDOGATOR (MISCELLANEOUS) ×4 IMPLANT
WATER STERILE IRR 1000ML POUR (IV SOLUTION) ×4 IMPLANT

## 2014-07-30 NOTE — Anesthesia Procedure Notes (Signed)
Procedure Name: MAC Date/Time: 07/30/2014 10:49 AM Performed by: Pernell DupreADAMS, AMY A Pre-anesthesia Checklist: Patient identified, Timeout performed, Emergency Drugs available, Suction available and Patient being monitored Patient Re-evaluated:Patient Re-evaluated prior to inductionOxygen Delivery Method: Circle system utilized

## 2014-07-30 NOTE — Progress Notes (Signed)
[facility] PROGRESS NOTE Assessment/Plan: Acute lower GI bleed: - Started on a PPI, GI was consulted. - His hemoglobin has remained stable. - C. difficile PCR negative, appreciate the syndrome allergies assistance.  Erosive esophagitis: - cont PPI.  Hypokalemia: - Resolved supplement.  Leukocytosis: - Negative C. Difficile PCR, he  has remained afebrile. - Question due to acute gout flare.  Acute gout flare: - With a history of gout, colchicine.  Code Status: full Family Communication: none  Disposition Plan: 1-2 days   Consultants:  GI  Procedures:  CXR  Antibiotics:  None  HPI/Subjective: No further bloody diarrhea.  Objective: Filed Vitals:   07/29/14 0622 07/29/14 1457 07/29/14 2239 07/30/14 0527  BP: 124/75 125/74 130/74 120/79  Pulse: 72 72 69 84  Temp: 97.5 F (36.4 C) 97.9 F (36.6 C) 98.1 F (36.7 C) 98.7 F (37.1 C)  TempSrc: Oral Oral Axillary Oral  Resp: 20 18 20 20   Height:      Weight:      SpO2: 100% 100% 100% 97%    Intake/Output Summary (Last 24 hours) at 07/30/14 0927 Last data filed at 07/30/14 0406  Gross per 24 hour  Intake    280 ml  Output      4 ml  Net    276 ml   Filed Weights   07/28/14 2353  Weight: 173.274 kg (382 lb)    Exam:  General: Alert, awake, oriented x3, in no acute distress.  HEENT: No bruits, no goiter.  Heart: Regular rate and rhythm. Lungs: Good air movement, Clear Abdomen: Soft, diffuse tnderness, nondistended, positive bowel sounds.  Neuro: Grossly intact, nonfocal.   Data Reviewed: Basic Metabolic Panel:  Recent Labs Lab 07/29/14 0024 07/29/14 0618  NA 139 141  K 3.3* 3.5  CL 106 108  CO2 25 26  GLUCOSE 117* 104*  BUN 9 8  CREATININE 0.68 0.67  CALCIUM 8.7 8.4   Liver Function Tests:  Recent Labs Lab 07/29/14 0024  AST 15  ALT 17  ALKPHOS 66  BILITOT 0.3  PROT 6.6  ALBUMIN 3.4*    Recent Labs Lab 07/29/14 0024  LIPASE 23   No results for input(s):  AMMONIA in the last 168 hours. CBC:  Recent Labs Lab 07/29/14 0024 07/30/14 0541  WBC 11.7* 11.7*  NEUTROABS 6.9  --   HGB 14.2 13.4  HCT 42.3 40.8  MCV 87.0 88.9  PLT 217 221   Cardiac Enzymes: No results for input(s): CKTOTAL, CKMB, CKMBINDEX, TROPONINI in the last 168 hours. BNP (last 3 results) No results for input(s): BNP in the last 8760 hours.  ProBNP (last 3 results)  Recent Labs  02/26/14 0750  PROBNP 24.4    CBG: No results for input(s): GLUCAP in the last 168 hours.  Recent Results (from the past 240 hour(s))  Clostridium Difficile by PCR     Status: None   Collection Time: 07/29/14  6:55 PM  Result Value Ref Range Status   C difficile by pcr NEGATIVE NEGATIVE Final     Studies: Dg Wrist 2 Views Right  07/29/2014   CLINICAL DATA:  Two day history of pain. No history of recent trauma  EXAM: RIGHT WRIST -2 VIEW  COMPARISON:  March 10, 2014  FINDINGS: Frontal and lateral views were obtained. There is no fracture or dislocation. There is narrowing of the radiocarpal joint. There are no erosive changes.  IMPRESSION: Narrowing of radiocarpal joint.  No acute fracture or dislocation.   Electronically Signed  By: Bretta Bang III M.D.   On: 07/29/2014 16:24   Ct Abdomen Pelvis W Contrast  07/29/2014   CLINICAL DATA:  Mid abdominal pain for 2 days.  EXAM: CT ABDOMEN AND PELVIS WITH CONTRAST  TECHNIQUE: Multidetector CT imaging of the abdomen and pelvis was performed using the standard protocol following bolus administration of intravenous contrast.  CONTRAST:  100 mL Omnipaque 300 IV  COMPARISON:  CT 02/09/2014  FINDINGS: Tiny subpleural nodule in the left and right lower lobe, unchanged. No consolidation or pleural effusion in the lung bases.  Fatty infiltration of the liver without focal lesion. Gallbladder is decompressed and contains gallstone. Spleen and pancreas are normal. Benign hypodense left adrenal lesion measuring 2.3 cm, Hounsfield units of -10,  adenoma versus myelolipoma. Right adrenal gland is normal. There is no hydronephrosis, perinephric stranding, or focal renal abnormality.  The stomach is distended with ingested contrast. There is no small bowel dilatation or thickening. The appendix is normal. Small volume of colonic stool, no colonic thickening. No bowel obstruction. No free intra-abdominal air, free fluid, or fluid collection. The abdominal aorta is normal in caliber without adenopathy. Small fat containing umbilical hernia.  Within the pelvis the bladder is physiologically distended. Prostate gland is normal in size. Stable bilateral enlarged inguinal lymph nodes. Stable enlarged bilateral external iliac lymph nodes.  There are no acute or suspicious osseous abnormalities.  IMPRESSION: 1. No acute abnormality in the abdomen/pelvis. 2. Stable chronic findings of hepatic steatosis, cholelithiasis, benign left adrenal lesion and inguinal adenopathy.   Electronically Signed   By: Rubye Oaks M.D.   On: 07/29/2014 02:47    Scheduled Meds: . allopurinol  300 mg Oral Daily  . benazepril  5 mg Oral Daily  . colchicine  0.6 mg Oral BID  . gabapentin  300 mg Oral TID  . metroNIDAZOLE  500 mg Oral 3 times per day  . [START ON 08/01/2014] pantoprazole (PROTONIX) IV  40 mg Intravenous Q12H  . sodium chloride  3 mL Intravenous Q12H   Continuous Infusions: . sodium chloride       Marinda Elk  [facility] Pager [phone]. If 7PM-7AM, please contact night-coverage at [email or web address], password [email or web address] 07/30/2014, 9:27 AM

## 2014-07-30 NOTE — H&P (Signed)
  No change. Patient with headache and nausea since beginning colonoscopy prep yesterday afternoon. Nonfocal examination. Hemoglobin remains in normal . C. difficile came back negative. Denies NSAIDs. We'll plan to proceed with a diagnostic colonoscopy. If colonoscopy is unrevealing as to the cause of bleeding, will perform a diagnostic EGD. Patient is agreeable to this approach. The risks, benefits, limitations, imponderables and alternatives regarding both EGD and colonoscopy have been reviewed with the patient. Questions have been answered. All parties agreeable.

## 2014-07-30 NOTE — Op Note (Signed)
NAMElgie Congo:  Atkins, [name]             ACCOUNT NO.:  [id number]  MEDICAL RECORD NO.:  [id number]  LOCATION:  [facility]                          FACILITY:  [facility]  PHYSICIAN:  R. Roetta SessionsMichael Mikesha Migliaccio, MD FACP FACGDATE OF BIRTH:  07/16/1985  DATE OF PROCEDURE:  07/30/2014 DATE OF DISCHARGE:                              OPERATIVE REPORT   PROCEDURE:  Diagnostic ileocolonoscopy followed by diagnostic EGD.  INDICATIONS FOR PROCEDURE:  A 29 year old gentleman admitted to the hospital with large volume hematochezia, remains hemodynamically stable. Hemoglobin remains normal.  History of complicated peptic ulcer disease for which he was evaluated back in 2014, found to have a bleeding peptic ulcer disease requiring therapeutic intervention.  Colonoscopy is now being done.  Risks, benefits, limitations, alternatives, and imponderables have been discussed with the patient.  Potential for EGD to follow if colonoscopy is unremarkable reviewed.  Please see the documentation medical record.  Because of sedation issues, he will be done under deep sedation with the help of Dr. Jayme CloudGonzalez and associates.  PROCEDURE NOTE:  O2 saturation, blood pressure, pulse, respirations monitored, throughout the entire procedure.  Deep sedation per Dr. Jayme CloudGonzalez and associates.  INSTRUMENTATION:  Pentax video chip system.  FINDINGS:  Digital rectal exam revealed lax sphincter tone.  Prep was adequate.  Examination of rectal mucosa including retroflexed view of the anal verge revealed some anal canal/internal hemorrhoids; otherwise, rectal mucosa appeared normal.  The colonic mucosa was surveyed from the rectosigmoid junction through the left transverse and right colon to the area of the appendiceal orifice, ileocecal valve, and cecum.  These structures were well seen and photographed for the record.  The terminal ileum was intubated to 10 cm.  From this level, the scope was slowly withdrawn.  All previously mentioned mucosal  surfaces were again seen. The colonic as well as the distal terminal ileal mucosa appeared entirely normal.  There was no blood in the lower GI tract.  The patient tolerated the procedure well.  He was prepared for EGD.  The examination of tubular esophagus revealed no mucosal abnormalities. The stomach is empty.  Thorough examination of gastric mucosa including retroflexion view of proximal stomach, esophagogastric junction demonstrated only a moderate size hiatal hernia.  He had patent pylorus. Examination of the first and second portion of duodenum revealed no abnormalities.  THERAPEUTIC/DIAGNOSTIC MANEUVERS PERFORMED:  None.  DISPOSITION:  The patient tolerated both procedures well, was taken to PACU in stable condition.  IMPRESSION:  Colonoscopy anal canal/internal hemorrhoids, otherwise normal ileocolonoscopy.  Hiatal hernia, otherwise normal EGD.  I suspect a trivial GI bleed.  RECOMMENDATIONS: 1. Advance diet. 2. Course of Anusol suppositories. 3. Further recommendations to follow.  He will be reassessed tomorrow     morning.     Jonathon Bellows. [name], MD FACP [facility] CenterFACG     RMR/MEDQ  D:  07/30/2014  T:  07/30/2014  Job:  [id number]

## 2014-07-30 NOTE — Progress Notes (Signed)
UR completed 

## 2014-07-30 NOTE — Transfer of Care (Signed)
Immediate Anesthesia Transfer of Care Note  Patient: [name]  Procedure(s) Performed: Procedure(s) with comments: COLONOSCOPY WITH PROPOFOL (N/A) - In cecum @ 1108, out @ 1116 ESOPHAGOGASTRODUODENOSCOPY (EGD) WITH PROPOFOL (N/A)  Patient Location: PACU  Anesthesia Type:MAC  Level of Consciousness: awake, alert , oriented and patient cooperative  Airway & Oxygen Therapy: Patient Spontanous Breathing and Patient connected to face mask oxygen  Post-op Assessment: Report given to RN and Post -op Vital signs reviewed and stable  Post vital signs: Reviewed and stable  Last Vitals:  Filed Vitals:   07/30/14 1045  BP:   Pulse:   Temp:   Resp: 16    Complications: No apparent anesthesia complications

## 2014-07-30 NOTE — Progress Notes (Signed)
Enteric precautions d/c'd -  C.diff negative

## 2014-07-30 NOTE — Anesthesia Preprocedure Evaluation (Addendum)
Anesthesia Evaluation  Patient identified by MRN, date of birth, ID band Patient awake    Reviewed: Allergy & Precautions, NPO status , Patient's Chart, lab work & pertinent test results  Airway Mallampati: III  TM Distance: >3 FB     Dental  (+) Poor Dentition, Missing, Dental Advisory Given   Pulmonary Current Smoker,  breath sounds clear to auscultation        Cardiovascular negative cardio ROS  Rhythm:Regular Rate:Normal     Neuro/Psych  Headaches (severe HA today),    GI/Hepatic PUD, GERD-  ,  Endo/Other  Morbid obesity  Renal/GU      Musculoskeletal   Abdominal   Peds  Hematology  (+) anemia ,   Anesthesia Other Findings   Reproductive/Obstetrics                            Anesthesia Physical Anesthesia Plan  ASA: III  Anesthesia Plan: MAC   Post-op Pain Management:    Induction: Intravenous  Airway Management Planned: Simple Face Mask  Additional Equipment:   Intra-op Plan:   Post-operative Plan:   Informed Consent: I have reviewed the patients History and Physical, chart, labs and discussed the procedure including the risks, benefits and alternatives for the proposed anesthesia with the patient or authorized representative who has indicated his/her understanding and acceptance.     Plan Discussed with:   Anesthesia Plan Comments:         Anesthesia Quick Evaluation

## 2014-07-30 NOTE — Anesthesia Postprocedure Evaluation (Signed)
  Anesthesia Post-op Note  Patient: [name]  Procedure(s) Performed: Procedure(s) with comments: COLONOSCOPY WITH PROPOFOL (N/A) - In cecum @ 1108, out @ 1116 ESOPHAGOGASTRODUODENOSCOPY (EGD) WITH PROPOFOL (N/A)  Patient Location: PACU  Anesthesia Type:MAC  Level of Consciousness: awake, alert , oriented and patient cooperative  Airway and Oxygen Therapy: Patient Spontanous Breathing and Patient connected to face mask oxygen  Post-op Pain: none  Post-op Assessment: Post-op Vital signs reviewed, Patient's Cardiovascular Status Stable, Respiratory Function Stable, Patent Airway and No signs of Nausea or vomiting  Post-op Vital Signs: Reviewed and stable  Last Vitals:  Filed Vitals:   07/30/14 1045  BP:   Pulse:   Temp:   Resp: 16    Complications: No apparent anesthesia complications

## 2014-07-30 NOTE — Addendum Note (Signed)
Addendum  created 07/30/14 1428 by Franco Noneseresa S Mclane Arora, CRNA   Modules edited: Anesthesia Attestations, Anesthesia Events, Anesthesia Responsible Staff, Narrator   Narrator:  Narrator: Event Log Edited

## 2014-07-31 ENCOUNTER — Encounter (HOSPITAL_COMMUNITY): Payer: Self-pay | Admitting: Internal Medicine

## 2014-07-31 DIAGNOSIS — K921 Melena: Secondary | ICD-10-CM

## 2014-07-31 MED ORDER — OXYCODONE-ACETAMINOPHEN 5-325 MG PO TABS: 1.0000 | ORAL_TABLET | Freq: Four times a day (QID) | ORAL | Status: DC | PRN

## 2014-07-31 MED ORDER — PANTOPRAZOLE SODIUM 40 MG PO TBEC: 40.0000 mg | DELAYED_RELEASE_TABLET | Freq: Two times a day (BID) | ORAL | Status: DC

## 2014-07-31 MED ORDER — PREDNISONE 20 MG PO TABS: 40.0000 mg | ORAL_TABLET | Freq: Every day | ORAL | Status: DC

## 2014-07-31 NOTE — Discharge Summary (Addendum)
Physician Discharge Summary  [name]:[id number] DOB: May 19, 1985 DOA: 07/28/2014  PCP: Toma Deiters, MD  Admit date: 07/28/2014 Discharge date: 07/31/2014  Time spent: 35 minutes  Recommendations for Outpatient Follow-up:  1. Follow-up with gastroenterology in one week. 2. Follow-up with PCP and to 4 weeks.  Discharge Diagnoses:  Active Problems:   Hypokalemia   Erosive esophagitis   Acute lower GI bleeding   Abdominal pain   Wrist pain, acute   Hiatal hernia   Hemorrhoid   Rectal bleed   Discharge Condition: Stable  Diet recommendation: heart healthy  Filed Weights   07/28/14 2353  Weight: 173.274 kg (382 lb)    History of present illness:  29 year old male who  has a past medical history of Gout; Cellulitis; Obesity; Erosive esophagitis,Gastric ulcer with hemorrhage  Acute blood loss anemia, UTI (lower urinary tract infection); and Gall stones. Today presents to the hospital with chief complaint of rectal bleeding. Patient has a history of gastric ulcer with hemorrhage, and has been having loose stools the past 2 days. Today patient noticed that there was bright red blood in the stool today. He also complains of abdominal pain which has been going on for past 2 days. He admits to having nausea but no vomiting. Denies any fever, no dysuria, no chest pain or shortness of breath at this time. In the ED patient's hemoglobin is found to be stable, stool for occult blood done by the ED physician was positive. Patient started on IV Protonix infusion.  Hospital Course:  Acute lower GI bleed: - Started on a PPI, GI was consulted. Colonoscopy on 4.15.2016 hemorrhoids. - His hemoglobin has remained stable. - C. difficile PCR negative.  Erosive esophagitis: - cont PPI.  Hypokalemia: - Resolved supplement.  Leukocytosis: - Negative C. Difficile PCR, he has remained afebrile. - Due to acute gout flare.  Acute gout flare: - With a history of gout, started on  steroids with improved mobility.  Procedures:  Colonoscopy  CT Abd and pelvis  Wrist 2 view.  Consultations:  Gastroenterology  Discharge Exam: Filed Vitals:   07/31/14 0611  BP: 103/61  Pulse: 61  Temp: 97.5 F (36.4 C)  Resp: 20    General: A&O x3 Cardiovascular: RRR Respiratory: good air movement CTA B/L  Discharge Instructions   Discharge Instructions    Diet - low sodium heart healthy    Complete by:  As directed      Increase activity slowly    Complete by:  As directed           Current Discharge Medication List    START taking these medications   Details  predniSONE (DELTASONE) 20 MG tablet Take 2 tablets (40 mg total) by mouth daily with breakfast. Qty: 10 tablet, Refills: 0      CONTINUE these medications which have CHANGED   Details  oxyCODONE-acetaminophen (ROXICET) 5-325 MG per tablet Take 1 tablet by mouth every 6 (six) hours as needed. Qty: 20 tablet, Refills: 0    pantoprazole (PROTONIX) 40 MG tablet Take 1 tablet (40 mg total) by mouth 2 (two) times daily. Qty: 30 tablet, Refills: 0      CONTINUE these medications which have NOT CHANGED   Details  albuterol (PROVENTIL HFA;VENTOLIN HFA) 108 (90 BASE) MCG/ACT inhaler Inhale 2 puffs into the lungs every 6 (six) hours as needed for wheezing or shortness of breath. Qty: 1 Inhaler, Refills: 2    allopurinol (ZYLOPRIM) 300 MG tablet Take 300 mg by mouth daily.  benazepril (LOTENSIN) 5 MG tablet Take 5 mg by mouth daily.    colchicine 0.6 MG tablet Take 1 tablet (0.6 mg total) by mouth daily. Qty: 30 tablet, Refills: 0    gabapentin (NEURONTIN) 300 MG capsule Take 600 mg by mouth 3 (three) times daily.       STOP taking these medications     clindamycin (CLEOCIN) 300 MG capsule      doxycycline (VIBRA-TABS) 100 MG tablet      HYDROcodone-acetaminophen (NORCO/VICODIN) 5-325 MG per tablet      predniSONE (STERAPRED UNI-PAK) 10 MG tablet        Allergies  Allergen Reactions   . Asa [Aspirin] Other (See Comments)    Pt doesn't know reaction.  . Nsaids Other (See Comments)    Causes Ulcers   . Penicillins Other (See Comments)    Pt doesn't know reaction.  . Sulfa Antibiotics Other (See Comments)    Pt doesn't know reaction.      The results of significant diagnostics from this hospitalization (including imaging, microbiology, ancillary and laboratory) are listed below for reference.    Significant Diagnostic Studies: Dg Wrist 2 Views Right  July 30, 2014   CLINICAL DATA:  Two day history of pain. No history of recent trauma  EXAM: RIGHT WRIST -2 VIEW  COMPARISON:  March 10, 2014  FINDINGS: Frontal and lateral views were obtained. There is no fracture or dislocation. There is narrowing of the radiocarpal joint. There are no erosive changes.  IMPRESSION: Narrowing of radiocarpal joint.  No acute fracture or dislocation.   Electronically Signed   By: Bretta Bang III M.D.   On: 07/30/2014 16:24   Ct Abdomen Pelvis W Contrast  07/30/14   CLINICAL DATA:  Mid abdominal pain for 2 days.  EXAM: CT ABDOMEN AND PELVIS WITH CONTRAST  TECHNIQUE: Multidetector CT imaging of the abdomen and pelvis was performed using the standard protocol following bolus administration of intravenous contrast.  CONTRAST:  100 mL Omnipaque 300 IV  COMPARISON:  CT 02/09/2014  FINDINGS: Tiny subpleural nodule in the left and right lower lobe, unchanged. No consolidation or pleural effusion in the lung bases.  Fatty infiltration of the liver without focal lesion. Gallbladder is decompressed and contains gallstone. Spleen and pancreas are normal. Benign hypodense left adrenal lesion measuring 2.3 cm, Hounsfield units of -10, adenoma versus myelolipoma. Right adrenal gland is normal. There is no hydronephrosis, perinephric stranding, or focal renal abnormality.  The stomach is distended with ingested contrast. There is no small bowel dilatation or thickening. The appendix is normal. Small volume of  colonic stool, no colonic thickening. No bowel obstruction. No free intra-abdominal air, free fluid, or fluid collection. The abdominal aorta is normal in caliber without adenopathy. Small fat containing umbilical hernia.  Within the pelvis the bladder is physiologically distended. Prostate gland is normal in size. Stable bilateral enlarged inguinal lymph nodes. Stable enlarged bilateral external iliac lymph nodes.  There are no acute or suspicious osseous abnormalities.  IMPRESSION: 1. No acute abnormality in the abdomen/pelvis. 2. Stable chronic findings of hepatic steatosis, cholelithiasis, benign left adrenal lesion and inguinal adenopathy.   Electronically Signed   By: Rubye Oaks M.D.   On: July 30, 2014 02:47    Microbiology: Recent Results (from the past 240 hour(s))  Clostridium Difficile by PCR     Status: None   Collection Time: 2014/07/30  6:55 PM  Result Value Ref Range Status   C difficile by pcr NEGATIVE NEGATIVE Final  Labs: Basic Metabolic Panel:  Recent Labs Lab 07/29/14 0024 07/29/14 0618  NA 139 141  K 3.3* 3.5  CL 106 108  CO2 25 26  GLUCOSE 117* 104*  BUN 9 8  CREATININE 0.68 0.67  CALCIUM 8.7 8.4   Liver Function Tests:  Recent Labs Lab 07/29/14 0024  AST 15  ALT 17  ALKPHOS 66  BILITOT 0.3  PROT 6.6  ALBUMIN 3.4*    Recent Labs Lab 07/29/14 0024  LIPASE 23   No results for input(s): AMMONIA in the last 168 hours. CBC:  Recent Labs Lab 07/29/14 0024 07/30/14 0541  WBC 11.7* 11.7*  NEUTROABS 6.9  --   HGB 14.2 13.4  HCT 42.3 40.8  MCV 87.0 88.9  PLT 217 221   Cardiac Enzymes: No results for input(s): CKTOTAL, CKMB, CKMBINDEX, TROPONINI in the last 168 hours. BNP: BNP (last 3 results) No results for input(s): BNP in the last 8760 hours.  ProBNP (last 3 results)  Recent Labs  02/26/14 0750  PROBNP 24.4    CBG: No results for input(s): GLUCAP in the last 168 hours.    Signed:  Marinda ElkFELIZ ORTIZ, ABRAHAM  [facility] 07/31/2014, 1:09 PM

## 2014-07-31 NOTE — Progress Notes (Signed)
Patient discharged with instructions, prescription, and care notes.  Verbalized understanding via teach back.  IV was removed and the site was WNL. Patient voiced no further complaints or concerns at the time of discharge.  Appointments scheduled per instructions.  Patient left the floor via w/c with staff and family in stable condition. 

## 2014-07-31 NOTE — Anesthesia Postprocedure Evaluation (Signed)
  Anesthesia Post-op Note  Patient: [name]  Procedure(s) Performed: Procedure(s) with comments: COLONOSCOPY WITH PROPOFOL (N/A) - In cecum @ 1108, out @ 1116 ESOPHAGOGASTRODUODENOSCOPY (EGD) WITH PROPOFOL (N/A)  Patient Location: Room 305  Anesthesia Type:MAC  Level of Consciousness: awake, alert , oriented and patient cooperative  Airway and Oxygen Therapy: Patient Spontanous Breathing  Post-op Pain: none  Post-op Assessment: Post-op Vital signs reviewed, Patient's Cardiovascular Status Stable, Respiratory Function Stable, Patent Airway, No signs of Nausea or vomiting and Pain level controlled  Post-op Vital Signs: Reviewed and stable  Last Vitals:  Filed Vitals:   07/31/14 0611  BP: 103/61  Pulse: 61  Temp: 36.4 C  Resp: 20    Complications: No apparent anesthesia complications

## 2014-07-31 NOTE — Progress Notes (Signed)
    Subjective: Feeling better. Some residual abdominal cramping and nausea. Denies vomiting. Small BM today with possibly some red-tinge but denies overt blood. States he's ready to go home.  Objective: Vital signs in last 24 hours: Temp:  [97.5 F (36.4 C)-98.7 F (37.1 C)] 97.5 F (36.4 C) (04/15 0611) Pulse Rate:  [59-85] 61 (04/15 0611) Resp:  [12-26] 20 (04/15 0611) BP: (95-126)/(51-69) 103/61 mmHg (04/15 0611) SpO2:  [83 %-98 %] 98 % (04/15 0611) Last BM Date: 07/30/14 General:   Alert and oriented, pleasant morbidly obese male Head:  Normocephalic and atraumatic. Heart:  S1, S2 present, no murmurs noted.  Lungs: Clear to auscultation bilaterally, without wheezing, rales, or rhonchi.  Abdomen:  Bowel sounds present, obese, soft, non-distended. Mild TTP mid-abdomen. No HSM or hernias noted. No rebound or guarding. No masses appreciated  Neurologic:  Alert and  oriented x4;  grossly normal neurologically. Skin:  Warm and dry, intact without significant lesions.  Psych:  Alert and cooperative. Normal mood and affect.  Intake/Output from previous day: 04/14 0701 - 04/15 0700 In: 1130 [P.O.:780; I.V.:350] Out: 300 [Urine:300] Intake/Output this shift:    Lab Results:  Recent Labs  07/29/14 0024 07/30/14 0541  WBC 11.7* 11.7*  HGB 14.2 13.4  HCT 42.3 40.8  PLT 217 221   BMET  Recent Labs  07/29/14 0024 07/29/14 0618  NA 139 141  K 3.3* 3.5  CL 106 108  CO2 25 26  GLUCOSE 117* 104*  BUN 9 8  CREATININE 0.68 0.67  CALCIUM 8.7 8.4   LFT  Recent Labs  07/29/14 0024  PROT 6.6  ALBUMIN 3.4*  AST 15  ALT 17  ALKPHOS 66  BILITOT 0.3   PT/INR  Recent Labs  07/29/14 0024  LABPROT 13.0  INR 0.97   Hepatitis Panel No results for input(s): HEPBSAG, HCVAB, HEPAIGM, HEPBIGM in the last 72 hours.   Studies/Results: Dg Wrist 2 Views Right  07/29/2014   CLINICAL DATA:  Two day history of pain. No history of recent trauma  EXAM: RIGHT WRIST -2 VIEW   COMPARISON:  March 10, 2014  FINDINGS: Frontal and lateral views were obtained. There is no fracture or dislocation. There is narrowing of the radiocarpal joint. There are no erosive changes.  IMPRESSION: Narrowing of radiocarpal joint.  No acute fracture or dislocation.   Electronically Signed   By: Bretta BangWilliam  Woodruff III M.D.   On: 07/29/2014 16:24    Assessment: 29 -year-old male admitted for rectal bleeding. Colonoscopy completed yesterday which found internal hemorrhoids, otherwise normal ileocolonoscopy. Hiatal hernia, otherwise normal EGD. Suspected trivial GI bleed. Stable H/H which was 13.4/40.8 yesterday. Vital signs stable. Tolerating cardiac diet, some residual nausea. C.Diff negative, GI pathogen panel pending.  Plan: 1. Monitor for any signs/symptoms of recurrent bleed 2. Anusol suppositories as needed 3. Continue Zofran as needed for nausea. 4. Continue PPI    Wynne DustEric Surie Suchocki, AGNP-C Adult & Gerontological Nurse Practitioner [facility]    LOS: 1 day    07/31/2014, 8:52 AM

## 2014-07-31 NOTE — Care Management Utilization Note (Signed)
UR completed 

## 2014-07-31 NOTE — Addendum Note (Signed)
Addendum  created 07/31/14 1049 by Earleen NewportAmy A Adams, CRNA   Modules edited: Notes Section   Notes Section:  File: [id number]

## 2014-08-01 LAB — GI PATHOGEN PANEL BY PCR, STOOL
C difficile toxin A/B: NOT DETECTED
Campylobacter by PCR: NOT DETECTED
Cryptosporidium by PCR: NOT DETECTED
E coli (ETEC) LT/ST: NOT DETECTED
E coli (STEC): NOT DETECTED
E coli 0157 by PCR: NOT DETECTED
G lamblia by PCR: NOT DETECTED
Norovirus GI/GII: NOT DETECTED
ROTAVIRUS A BY PCR: NOT DETECTED
SALMONELLA BY PCR: NOT DETECTED
Shigella by PCR: NOT DETECTED

## 2014-11-14 ENCOUNTER — Encounter (HOSPITAL_COMMUNITY): Payer: Self-pay | Admitting: Emergency Medicine

## 2014-11-14 ENCOUNTER — Emergency Department (HOSPITAL_COMMUNITY)
Admission: EM | Admit: 2014-11-14 | Discharge: 2014-11-14 | Disposition: A | Discharge status: Home / Self Care | Payer: Medicaid Other | Attending: Emergency Medicine | Admitting: Emergency Medicine

## 2014-11-14 DIAGNOSIS — Z7952 Long term (current) use of systemic steroids: Secondary | ICD-10-CM | POA: Insufficient documentation

## 2014-11-14 DIAGNOSIS — K0381 Cracked tooth: Secondary | ICD-10-CM | POA: Insufficient documentation

## 2014-11-14 DIAGNOSIS — Z720 Tobacco use: Secondary | ICD-10-CM | POA: Diagnosis not present

## 2014-11-14 DIAGNOSIS — S025XXA Fracture of tooth (traumatic), initial encounter for closed fracture: Secondary | ICD-10-CM

## 2014-11-14 DIAGNOSIS — M109 Gout, unspecified: Secondary | ICD-10-CM | POA: Diagnosis not present

## 2014-11-14 DIAGNOSIS — Z87440 Personal history of urinary (tract) infections: Secondary | ICD-10-CM | POA: Insufficient documentation

## 2014-11-14 DIAGNOSIS — K0889 Other specified disorders of teeth and supporting structures: Secondary | ICD-10-CM

## 2014-11-14 DIAGNOSIS — Z880 Allergy status to penicillin: Secondary | ICD-10-CM | POA: Diagnosis not present

## 2014-11-14 DIAGNOSIS — E669 Obesity, unspecified: Secondary | ICD-10-CM | POA: Insufficient documentation

## 2014-11-14 DIAGNOSIS — Z79899 Other long term (current) drug therapy: Secondary | ICD-10-CM | POA: Insufficient documentation

## 2014-11-14 DIAGNOSIS — K088 Other specified disorders of teeth and supporting structures: Secondary | ICD-10-CM | POA: Insufficient documentation

## 2014-11-14 DIAGNOSIS — Z872 Personal history of diseases of the skin and subcutaneous tissue: Secondary | ICD-10-CM | POA: Insufficient documentation

## 2014-11-14 DIAGNOSIS — Z862 Personal history of diseases of the blood and blood-forming organs and certain disorders involving the immune mechanism: Secondary | ICD-10-CM | POA: Diagnosis not present

## 2014-11-14 MED ORDER — ACETAMINOPHEN-CODEINE #3 300-30 MG PO TABS: 1.0000 | ORAL_TABLET | Freq: Four times a day (QID) | ORAL | Status: DC | PRN

## 2014-11-14 NOTE — Discharge Instructions (Signed)
Take the prescribed medication as directed.  May use over the counter orajel as needed to help numb the area as well. Follow-up with your dentist as soon as possible. Return to the ED for new or worsening symptoms.

## 2014-11-14 NOTE — ED Notes (Signed)
Pt states that he has chronic dental pain but it has been worse over the past few days.

## 2014-11-14 NOTE — ED Provider Notes (Signed)
CSN: [id number]     Arrival date & time 11/14/14  1757 History   First MD Initiated Contact with Patient 11/14/14 1758     Chief Complaint  Patient presents with  . Dental Pain     (Consider location/radiation/quality/duration/timing/severity/associated sxs/prior Treatment) Patient is a 29 y.o. male presenting with tooth pain. The history is provided by the patient and medical records.  Dental Pain   29 year old male with history of gout, obesity, GI bleeding secondary to NSAID use, gallstones, presenting to the ED for left upper dental pain. He states his tooth broke several months ago, but more broke off last night. He has had some intermittent bleeding from his tooth today. He denies any fever or chills. Patient has been taking Tylenol without relief. He cannot take aspirin or NSAIDs.  Patient routinely sees the health dept for dental needs, states he cannot see them until this coming Thursday.  VSS.  Past Medical History  Diagnosis Date  . Gout   . Cellulitis   . Obesity   . Erosive esophagitis 09/01/2012    NSAID induced.  . Gastric ulcer with hemorrhage 09/02/2012    s/p bleeding control tx. Per Dr. Jena Gauss  . Acute blood loss anemia 09/02/2012    S/p 1 unit rbcs  . UTI (lower urinary tract infection)   . Gall stones    Past Surgical History  Procedure Laterality Date  . Tonsillectomy    . Esophagogastroduodenoscopy Left 09/01/2012    WUJ:[id number] esophageal erosions consistent with erosive reflux esophagitis/Pre-pyloric benign-appearing gastric ulcer with bleeding stigmata status post bleeding control therapy as described above  . Esophagogastroduodenoscopy N/A 12/13/2012    Procedure: ESOPHAGOGASTRODUODENOSCOPY (EGD);  Surgeon: Corbin Ade, MD;  Location: [facility];  Service: Endoscopy;  Laterality: N/A;  11:30  . Colonoscopy with propofol N/A 07/30/2014    Procedure: COLONOSCOPY WITH PROPOFOL;  Surgeon: Corbin Ade, MD;  Location: [facility];  Service: Endoscopy;   Laterality: N/A;  In cecum @ 1108, out @ 1116  . Esophagogastroduodenoscopy (egd) with propofol N/A 07/30/2014    Procedure: ESOPHAGOGASTRODUODENOSCOPY (EGD) WITH PROPOFOL;  Surgeon: Corbin Ade, MD;  Location: [facility];  Service: Endoscopy;  Laterality: N/A;   Family History  Problem Relation Age of Onset  . Heart failure Mother    History  Substance Use Topics  . Smoking status: Light Tobacco Smoker -- 0.33 packs/day for 7 years    Types: Cigarettes  . Smokeless tobacco: Current User    Types: Snuff  . Alcohol Use: No    Review of Systems  HENT: Positive for dental problem.   All other systems reviewed and are negative.     Allergies  Asa; Nsaids; Penicillins; and Sulfa antibiotics  Home Medications   Prior to Admission medications   Medication Sig Start Date End Date Taking? Authorizing Provider  albuterol (PROVENTIL HFA;VENTOLIN HFA) 108 (90 BASE) MCG/ACT inhaler Inhale 2 puffs into the lungs every 6 (six) hours as needed for wheezing or shortness of breath. 02/26/14   Glynn Octave, MD  allopurinol (ZYLOPRIM) 300 MG tablet Take 300 mg by mouth daily.    Historical Provider, MD  benazepril (LOTENSIN) 5 MG tablet Take 5 mg by mouth daily.    Historical Provider, MD  colchicine 0.6 MG tablet Take 1 tablet (0.6 mg total) by mouth daily. 03/16/14   Clydia Llano, MD  gabapentin (NEURONTIN) 300 MG capsule Take 600 mg by mouth 3 (three) times daily.     Historical Provider, MD  oxyCODONE-acetaminophen (  ROXICET) 5-325 MG per tablet Take 1 tablet by mouth every 6 (six) hours as needed. 07/31/14   Marinda Elk, MD  pantoprazole (PROTONIX) 40 MG tablet Take 1 tablet (40 mg total) by mouth 2 (two) times daily. 07/31/14   Marinda Elk, MD  predniSONE (DELTASONE) 20 MG tablet Take 2 tablets (40 mg total) by mouth daily with breakfast. 07/31/14   Marinda Elk, MD   BP 147/96 mmHg  Pulse 87  Temp(Src) 98 F (36.7 C) (Oral)  Resp 16  Ht 5\' 7"  (1.702 m)  Wt 385 lb  (174.635 kg)  BMI 60.29 kg/m2  SpO2 100%   Physical Exam  Constitutional: He is oriented to person, place, and time. He appears well-developed and well-nourished.  crying  HENT:  Head: Normocephalic and atraumatic.  Mouth/Throat: Oropharynx is clear and moist.  Teeth largely in very poor dentition,  Left upper first pre-molar broken with some bleeding noted along posterior aspect of tooth, surrounding gingiva grossly normal without swelling or signs of abscess formation, handling secretions appropriately, no trismus  Eyes: Conjunctivae and EOM are normal. Pupils are equal, round, and reactive to light.  Neck: Normal range of motion.  Cardiovascular: Normal rate, regular rhythm and normal heart sounds.   Pulmonary/Chest: Effort normal and breath sounds normal.  Abdominal: Soft. Bowel sounds are normal.  Musculoskeletal: Normal range of motion.  Neurological: He is alert and oriented to person, place, and time.  Skin: Skin is warm and dry.  Psychiatric: He has a normal mood and affect.  Nursing note and vitals reviewed.   ED Course  Procedures (including critical care time) Labs Review Labs Reviewed - No data to display  Imaging Review No results found.   EKG Interpretation None      MDM   Final diagnoses:  Pain, dental  Broken tooth, closed, initial encounter   Dental pain due to fracture chews. No signs of dental abscess. No facial or neck swelling to suggest Ludwig's angina. Patient has history of gastric bleeding due to NSAIDs, will prescribe short course of Tylenol No. 3. Patient will follow-up with dental clinic later this week.  Discussed plan with patient, he/she acknowledged understanding and agreed with plan of care.  Return precautions given for new or worsening symptoms.  Garlon Hatchet, PA-C 11/14/14 1831  Mancel Bale, MD 11/14/14 [phone]

## 2015-01-11 ENCOUNTER — Encounter (HOSPITAL_COMMUNITY): Payer: Self-pay | Admitting: Emergency Medicine

## 2015-01-11 ENCOUNTER — Emergency Department (HOSPITAL_COMMUNITY)
Admission: EM | Admit: 2015-01-11 | Discharge: 2015-01-12 | Disposition: A | Discharge status: Home / Self Care | Payer: Medicaid Other | Attending: Emergency Medicine | Admitting: Emergency Medicine

## 2015-01-11 DIAGNOSIS — R112 Nausea with vomiting, unspecified: Secondary | ICD-10-CM

## 2015-01-11 DIAGNOSIS — Z79899 Other long term (current) drug therapy: Secondary | ICD-10-CM | POA: Diagnosis not present

## 2015-01-11 DIAGNOSIS — R197 Diarrhea, unspecified: Secondary | ICD-10-CM | POA: Insufficient documentation

## 2015-01-11 DIAGNOSIS — K648 Other hemorrhoids: Secondary | ICD-10-CM | POA: Diagnosis not present

## 2015-01-11 DIAGNOSIS — E669 Obesity, unspecified: Secondary | ICD-10-CM | POA: Insufficient documentation

## 2015-01-11 DIAGNOSIS — K625 Hemorrhage of anus and rectum: Secondary | ICD-10-CM | POA: Diagnosis present

## 2015-01-11 DIAGNOSIS — Z872 Personal history of diseases of the skin and subcutaneous tissue: Secondary | ICD-10-CM | POA: Diagnosis not present

## 2015-01-11 DIAGNOSIS — Z720 Tobacco use: Secondary | ICD-10-CM | POA: Insufficient documentation

## 2015-01-11 DIAGNOSIS — Z7952 Long term (current) use of systemic steroids: Secondary | ICD-10-CM | POA: Diagnosis not present

## 2015-01-11 DIAGNOSIS — R062 Wheezing: Secondary | ICD-10-CM | POA: Insufficient documentation

## 2015-01-11 DIAGNOSIS — M109 Gout, unspecified: Secondary | ICD-10-CM | POA: Insufficient documentation

## 2015-01-11 DIAGNOSIS — Z880 Allergy status to penicillin: Secondary | ICD-10-CM | POA: Diagnosis not present

## 2015-01-11 DIAGNOSIS — Z87440 Personal history of urinary (tract) infections: Secondary | ICD-10-CM | POA: Diagnosis not present

## 2015-01-11 LAB — CBC WITH DIFFERENTIAL/PLATELET
BASOS ABS: 0 10*3/uL (ref 0.0–0.1)
BASOS PCT: 0 %
Eosinophils Absolute: 0.5 10*3/uL (ref 0.0–0.7)
Eosinophils Relative: 5 %
HEMATOCRIT: 42.6 % (ref 39.0–52.0)
HEMOGLOBIN: 14.2 g/dL (ref 13.0–17.0)
LYMPHS PCT: 26 %
Lymphs Abs: 2.7 10*3/uL (ref 0.7–4.0)
MCH: 29.4 pg (ref 26.0–34.0)
MCHC: 33.3 g/dL (ref 30.0–36.0)
MCV: 88.2 fL (ref 78.0–100.0)
Monocytes Absolute: 0.9 10*3/uL (ref 0.1–1.0)
Monocytes Relative: 9 %
NEUTROS ABS: 6.2 10*3/uL (ref 1.7–7.7)
NEUTROS PCT: 60 %
Platelets: 199 10*3/uL (ref 150–400)
RBC: 4.83 MIL/uL (ref 4.22–5.81)
RDW: 13.1 % (ref 11.5–15.5)
WBC: 10.4 10*3/uL (ref 4.0–10.5)

## 2015-01-11 LAB — COMPREHENSIVE METABOLIC PANEL
ALK PHOS: 52 U/L (ref 38–126)
ALT: 14 U/L — ABNORMAL LOW (ref 17–63)
AST: 15 U/L (ref 15–41)
Albumin: 3.4 g/dL — ABNORMAL LOW (ref 3.5–5.0)
Anion gap: 9 (ref 5–15)
BILIRUBIN TOTAL: 0.3 mg/dL (ref 0.3–1.2)
BUN: 12 mg/dL (ref 6–20)
CALCIUM: 8.4 mg/dL — AB (ref 8.9–10.3)
CO2: 25 mmol/L (ref 22–32)
Chloride: 108 mmol/L (ref 101–111)
Creatinine, Ser: 0.8 mg/dL (ref 0.61–1.24)
GFR calc Af Amer: >60 mL/min (ref >60)
GFR calc non Af Amer: >60 mL/min (ref >60)
Glucose, Bld: 129 mg/dL — ABNORMAL HIGH (ref 65–99)
Potassium: 3.5 mmol/L (ref 3.5–5.1)
Sodium: 142 mmol/L (ref 135–145)
TOTAL PROTEIN: 6.6 g/dL (ref 6.5–8.1)

## 2015-01-11 LAB — LIPASE, BLOOD: Lipase: 23 U/L (ref 22–51)

## 2015-01-11 MED ORDER — SODIUM CHLORIDE 0.9 % IV BOLUS (SEPSIS)
1000.0000 mL | Freq: Once | INTRAVENOUS | Status: AC
  Administered 2015-01-11: 1000 mL via INTRAVENOUS

## 2015-01-11 MED ORDER — ONDANSETRON HCL 4 MG/2ML IJ SOLN
4.0000 mg | Freq: Once | INTRAMUSCULAR | Status: AC
  Administered 2015-01-11: 4 mg via INTRAVENOUS
  Filled 2015-01-11: qty 2

## 2015-01-11 NOTE — ED Notes (Signed)
Onset one week not feeling well, abdominal pain, onset tonight bright red blood from rectum with diarrhea, hx ulcers

## 2015-01-11 NOTE — ED Notes (Signed)
Assisted MD with occult card

## 2015-01-11 NOTE — ED Provider Notes (Signed)
CSN: [id number]     Arrival date & time 01/11/15  2153 History  By signing my name below, I, Arianna Nassar, attest that this documentation has been prepared under the direction and in the presence of Shon Baton, MD. Electronically Signed: Octavia Heir, ED Scribe. 01/11/2015. 11:21 PM.     Chief Complaint  Patient presents with  . Rectal Bleeding      The history is provided by the patient. No language interpreter was used.   HPI Comments: [name] is a 29 y.o. male who has a hx of internal hemorrhoids, hx of ulcers presents to the Emergency Department complaining of sudden onset rectal bleeding onset tonight. Patient reports recent one-day history of nausea, vomiting, and diarrhea. Denies any abdominal pain.  Pt states he has been sick with a head cold the past few weeks. He reports that he went to the bathroom earlier today and noted blood on the toilet paper and in the toilet. Came here immediately. He has not had any further episodes. He denies  abdominal pain.  Does report irritation and pain of the rectum secondary to diarrhea.  Past Medical History  Diagnosis Date  . Gout   . Cellulitis   . Obesity   . Erosive esophagitis 09/01/2012    NSAID induced.  . Gastric ulcer with hemorrhage 09/02/2012    s/p bleeding control tx. Per Dr. Jena Gauss  . Acute blood loss anemia 09/02/2012    S/p 1 unit rbcs  . UTI (lower urinary tract infection)   . Gall stones    Past Surgical History  Procedure Laterality Date  . Tonsillectomy    . Esophagogastroduodenoscopy Left 09/01/2012    QIO:NGEXBM esophageal erosions consistent with erosive reflux esophagitis/Pre-pyloric benign-appearing gastric ulcer with bleeding stigmata status post bleeding control therapy as described above  . Esophagogastroduodenoscopy N/A 12/13/2012    Procedure: ESOPHAGOGASTRODUODENOSCOPY (EGD);  Surgeon: Corbin Ade, MD;  Location: [facility];  Service: Endoscopy;  Laterality: N/A;  11:30  . Colonoscopy  with propofol N/A 07/30/2014    Procedure: COLONOSCOPY WITH PROPOFOL;  Surgeon: Corbin Ade, MD;  Location: [facility];  Service: Endoscopy;  Laterality: N/A;  In cecum @ 1108, out @ 1116  . Esophagogastroduodenoscopy (egd) with propofol N/A 07/30/2014    Procedure: ESOPHAGOGASTRODUODENOSCOPY (EGD) WITH PROPOFOL;  Surgeon: Corbin Ade, MD;  Location: [facility];  Service: Endoscopy;  Laterality: N/A;   Family History  Problem Relation Age of Onset  . Heart failure Mother    Social History  Substance Use Topics  . Smoking status: Light Tobacco Smoker -- 0.33 packs/day for 7 years    Types: Cigarettes  . Smokeless tobacco: Current User    Types: Snuff  . Alcohol Use: No    Review of Systems  Constitutional: Negative for fever.  HENT: Positive for congestion.   Respiratory: Negative for chest tightness and shortness of breath.   Cardiovascular: Negative for chest pain.  Gastrointestinal: Positive for vomiting, diarrhea and blood in stool. Negative for abdominal pain and constipation.  Neurological: Negative for light-headedness.  All other systems reviewed and are negative.     Allergies  Asa; Nsaids; Penicillins; and Sulfa antibiotics  Home Medications   Prior to Admission medications   Medication Sig Start Date End Date Taking? Authorizing Cydnie Deason  acetaminophen (TYLENOL) 325 MG tablet Take 650-975 mg by mouth daily as needed for mild pain or moderate pain.   Yes Historical Zurri Rudden, MD  albuterol (PROVENTIL HFA;VENTOLIN HFA) 108 (90 BASE) MCG/ACT  inhaler Inhale 2 puffs into the lungs every 6 (six) hours as needed for wheezing or shortness of breath. 02/26/14  Yes Glynn Octave, MD  allopurinol (ZYLOPRIM) 300 MG tablet Take 300 mg by mouth daily.   Yes Historical Diontre Harps, MD  benazepril (LOTENSIN) 5 MG tablet Take 5 mg by mouth daily.   Yes Historical Sigmond Patalano, MD  gabapentin (NEURONTIN) 300 MG capsule Take 600 mg by mouth 3 (three) times daily.    Yes Historical Areebah Meinders, MD   pantoprazole (PROTONIX) 40 MG tablet Take 1 tablet (40 mg total) by mouth 2 (two) times daily. 07/31/14  Yes Marinda Elk, MD  Phenyleph-Doxylamine-DM-APAP (VICKS DAYQUIL/NYQUIL CLD & FLU PO) Take 1-2 capsules by mouth daily as needed (for cold?flu symtoms).   Yes Historical Loring Liskey, MD  hydrocortisone (ANUSOL-HC) 25 MG suppository Place 1 suppository (25 mg total) rectally 2 (two) times daily. 01/12/15   Shon Baton, MD  ondansetron (ZOFRAN ODT) 4 MG disintegrating tablet Take 1 tablet (4 mg total) by mouth every 8 (eight) hours as needed for nausea or vomiting. 01/12/15   Shon Baton, MD   Triage vitals: BP 140/76 mmHg  Pulse 98  Temp(Src) 98.1 F (36.7 C) (Oral)  Resp 22  Ht  (1.702 m)  Wt 353 lb 4 oz (160.233 kg)  BMI 55.31 kg/m2  SpO2 93% Physical Exam  Constitutional: He is oriented to person, place, and time. No distress.  Obese  HENT:  Head: Normocephalic and atraumatic.  Cardiovascular: Normal rate, regular rhythm and normal heart sounds.   No murmur heard. Pulmonary/Chest: Effort normal. No respiratory distress. He has wheezes.  Abdominal: Soft. Bowel sounds are normal. There is no tenderness. There is no rebound and no guarding.  Genitourinary: Rectum normal.  No external hemorrhoids noted, no gross blood  Musculoskeletal: He exhibits edema.  Lymphadenopathy:    He has no cervical adenopathy.  Neurological: He is alert and oriented to person, place, and time.  Skin: Skin is warm and dry.  Psychiatric: He has a normal mood and affect.  Nursing note and vitals reviewed.   ED Course  Procedures  DIAGNOSTIC STUDIES: Oxygen Saturation is 93% on RA, low by my interpretation.  COORDINATION OF CARE:  11:22 PM Discussed treatment plan which includes rectal exam with pt at bedside and pt agreed to plan.  Labs Review Labs Reviewed  COMPREHENSIVE METABOLIC PANEL - Abnormal; Notable for the following:    Glucose, Bld 129 (*)    Calcium 8.4 (*)     Albumin 3.4 (*)    ALT 14 (*)    All other components within normal limits  CBC WITH DIFFERENTIAL/PLATELET  LIPASE, BLOOD  OCCULT BLOOD X 1 CARD TO LAB, STOOL    Imaging Review No results found. I have personally reviewed and evaluated these images and lab results as part of my medical decision-making.   EKG Interpretation None      MDM   Final diagnoses:  Nausea vomiting and diarrhea  Internal bleeding hemorrhoids    Patient presents with concerns for rectal bleeding. History of bleeding ulcers as well as internal hemorrhoids. Followed by Dr. Jena Gauss.  Nontoxic on exam.  Vital signs are reassuring. No evidence of active bleeding.  Basic labwork obtained and reassuring. Hemoglobin is 14.2 and stable. No recurrence of bleeding in the ER. Patient denies abdominal pain. Suspect he may be related to irritation of internal hemorrhoids. Less likely ulcerative bleeding given the description of bright red blood and lack of abdominal pain.  Patient was able to tolerate food. Will discharge with Anusol suppositories and Zofran for nausea. Suspect the nausea, vomiting, and diarrhea secondary to acute viral illness.  After history, exam, and medical workup I feel the patient has been appropriately medically screened and is safe for discharge home. Pertinent diagnoses were discussed with the patient. Patient was given return precautions.  I personally performed the services described in this documentation, which was scribed in my presence. The recorded information has been reviewed and is accurate.   Shon Baton, MD 01/12/15 [id number]

## 2015-01-12 ENCOUNTER — Telehealth: Payer: Self-pay | Admitting: Internal Medicine

## 2015-01-12 LAB — POC OCCULT BLOOD, ED: Fecal Occult Bld: POSITIVE — AB

## 2015-01-12 MED ORDER — HYDROCORTISONE ACETATE 25 MG RE SUPP: 25.0000 mg | Freq: Two times a day (BID) | RECTAL | Status: DC

## 2015-01-12 MED ORDER — ONDANSETRON 4 MG PO TBDP: 4.0000 mg | ORAL_TABLET | Freq: Three times a day (TID) | ORAL | Status: DC | PRN

## 2015-01-12 MED ORDER — LANSOPRAZOLE 30 MG PO CPDR: 30.0000 mg | DELAYED_RELEASE_CAPSULE | Freq: Every day | ORAL | Status: DC

## 2015-01-12 NOTE — Telephone Encounter (Signed)
PLEASE CALL PATIENT REGARDING STOMACH PAIN , HE WAS SEEN IN THE ER LAST NIGHT

## 2015-01-12 NOTE — Telephone Encounter (Signed)
Reviewed ED notes; patient has not been seen here in over 2 years.  We can call in a 2 week supply of lansoprazole 30 mg once daily (dispense #14 capsules) no refill. Office visit with extender within the next one to 2 weeks.

## 2015-01-12 NOTE — ED Notes (Signed)
Patient given discharge instruction, verbalized understand. IV removed, band aid applied. Patient ambulatory out of the department.  

## 2015-01-12 NOTE — ED Notes (Signed)
Pt drinking ginger ale  

## 2015-01-12 NOTE — Discharge Instructions (Signed)
You were seen today for rectal bleeding, nausea, vomiting, and diarrhea. Your exam is reassuring. Your rectal bleeding may be secondary to internal hemorrhoids. He'll be given Anusol suppositories. Zofran for symptom control.  Follow up with your primary doctor and GI doctor if symptoms persist.  Hemorrhoids Hemorrhoids are swollen veins around the rectum or anus. There are two types of hemorrhoids:   Internal hemorrhoids. These occur in the veins just inside the rectum. They may poke through to the outside and become irritated and painful.  External hemorrhoids. These occur in the veins outside the anus and can be felt as a painful swelling or hard lump near the anus. CAUSES  Pregnancy.   Obesity.   Constipation or diarrhea.   Straining to have a bowel movement.   Sitting for long periods on the toilet.  Heavy lifting or other activity that caused you to strain.  Anal intercourse. SYMPTOMS   Pain.   Anal itching or irritation.   Rectal bleeding.   Fecal leakage.   Anal swelling.   One or more lumps around the anus.  DIAGNOSIS  Your caregiver may be able to diagnose hemorrhoids by visual examination. Other examinations or tests that may be performed include:   Examination of the rectal area with a gloved hand (digital rectal exam).   Examination of anal canal using a small tube (scope).   A blood test if you have lost a significant amount of blood.  A test to look inside the colon (sigmoidoscopy or colonoscopy). TREATMENT Most hemorrhoids can be treated at home. However, if symptoms do not seem to be getting better or if you have a lot of rectal bleeding, your caregiver may perform a procedure to help make the hemorrhoids get smaller or remove them completely. Possible treatments include:   Placing a rubber band at the base of the hemorrhoid to cut off the circulation (rubber band ligation).   Injecting a chemical to shrink the hemorrhoid  (sclerotherapy).   Using a tool to burn the hemorrhoid (infrared light therapy).   Surgically removing the hemorrhoid (hemorrhoidectomy).   Stapling the hemorrhoid to block blood flow to the tissue (hemorrhoid stapling).  HOME CARE INSTRUCTIONS   Eat foods with fiber, such as whole grains, beans, nuts, fruits, and vegetables. Ask your doctor about taking products with added fiber in them (fibersupplements).  Increase fluid intake. Drink enough water and fluids to keep your urine clear or pale yellow.   Exercise regularly.   Go to the bathroom when you have the urge to have a bowel movement. Do not wait.   Avoid straining to have bowel movements.   Keep the anal area dry and clean. Use wet toilet paper or moist towelettes after a bowel movement.   Medicated creams and suppositories may be used or applied as directed.   Only take over-the-counter or prescription medicines as directed by your caregiver.   Take warm sitz baths for 15-20 minutes, 3-4 times a day to ease pain and discomfort.   Place ice packs on the hemorrhoids if they are tender and swollen. Using ice packs between sitz baths may be helpful.   Put ice in a plastic bag.   Place a towel between your skin and the bag.   Leave the ice on for 15-20 minutes, 3-4 times a day.   Do not use a donut-shaped pillow or sit on the toilet for long periods. This increases blood pooling and pain.  SEEK MEDICAL CARE IF:  You have increasing pain  and swelling that is not controlled by treatment or medicine.  You have uncontrolled bleeding.  You have difficulty or you are unable to have a bowel movement.  You have pain or inflammation outside the area of the hemorrhoids. MAKE SURE YOU:  Understand these instructions.  Will watch your condition.  Will get help right away if you are not doing well or get worse. Document Released: 03/31/2000 Document Revised: 03/20/2012 Document Reviewed:  02/06/2012 [facility] Patient Information 2015 [place], [place]. This information is not intended to replace advice given to you by your health care provider. Make sure you discuss any questions you have with your health care provider. Viral Gastroenteritis Viral gastroenteritis is also known as stomach flu. This condition affects the stomach and intestinal tract. It can cause sudden diarrhea and vomiting. The illness typically lasts 3 to 8 days. Most people develop an immune response that eventually gets rid of the virus. While this natural response develops, the virus can make you quite ill. CAUSES  Many different viruses can cause gastroenteritis, such as rotavirus or noroviruses. You can catch one of these viruses by consuming contaminated food or water. You may also catch a virus by sharing utensils or other personal items with an infected person or by touching a contaminated surface. SYMPTOMS  The most common symptoms are diarrhea and vomiting. These problems can cause a severe loss of body fluids (dehydration) and a body salt (electrolyte) imbalance. Other symptoms may include:  Fever.  Headache.  Fatigue.  Abdominal pain. DIAGNOSIS  Your caregiver can usually diagnose viral gastroenteritis based on your symptoms and a physical exam. A stool sample may also be taken to test for the presence of viruses or other infections. TREATMENT  This illness typically goes away on its own. Treatments are aimed at rehydration. The most serious cases of viral gastroenteritis involve vomiting so severely that you are not able to keep fluids down. In these cases, fluids must be given through an intravenous line (IV). HOME CARE INSTRUCTIONS   Drink enough fluids to keep your urine clear or pale yellow. Drink small amounts of fluids frequently and increase the amounts as tolerated.  Ask your caregiver for specific rehydration instructions.  Avoid:  Foods high in sugar.  Alcohol.  Carbonated  drinks.  Tobacco.  Juice.  Caffeine drinks.  Extremely hot or cold fluids.  Fatty, greasy foods.  Too much intake of anything at one time.  Dairy products until 24 to 48 hours after diarrhea stops.  You may consume probiotics. Probiotics are active cultures of beneficial bacteria. They may lessen the amount and number of diarrheal stools in adults. Probiotics can be found in yogurt with active cultures and in supplements.  Wash your hands well to avoid spreading the virus.  Only take over-the-counter or prescription medicines for pain, discomfort, or fever as directed by your caregiver. Do not give aspirin to children. Antidiarrheal medicines are not recommended.  Ask your caregiver if you should continue to take your regular prescribed and over-the-counter medicines.  Keep all follow-up appointments as directed by your caregiver. SEEK IMMEDIATE MEDICAL CARE IF:   You are unable to keep fluids down.  You do not urinate at least once every 6 to 8 hours.  You develop shortness of breath.  You notice blood in your stool or vomit. This may look like coffee grounds.  You have abdominal pain that increases or is concentrated in one small area (localized).  You have persistent vomiting or diarrhea.  You have a  fever.  The patient is a child younger than 3 months, and he or she has a fever.  The patient is a child older than 3 months, and he or she has a fever and persistent symptoms.  The patient is a child older than 3 months, and he or she has a fever and symptoms suddenly get worse.  The patient is a baby, and he or she has no tears when crying. MAKE SURE YOU:   Understand these instructions.  Will watch your condition.  Will get help right away if you are not doing well or get worse. Document Released: 04/03/2005 Document Revised: 06/26/2011 Document Reviewed: 01/18/2011 [facility] Patient Information 2015 [place], [place]. This information is not intended to replace  advice given to you by your health care provider. Make sure you discuss any questions you have with your health care provider.

## 2015-01-12 NOTE — Telephone Encounter (Signed)
Spoke with the pt- he is having abd pain that feels like it did when he had an ulcer. He is not currently taking anything for reflux. He was taking protonix when he was in the hospital in April, but he ran out and didn't take anything after he ran out of his meds. He has an appt next week with [place].   He wants to know if we can send in an rx for a reflux medication to [facility] [place].

## 2015-01-12 NOTE — Telephone Encounter (Signed)
rx sent to the pharmacy. Pt is aware. Has ov next week.

## 2015-01-12 NOTE — Addendum Note (Signed)
Addended by: Myra Rude on: 01/12/2015 02:23 PM   Modules accepted: Orders

## 2015-01-19 ENCOUNTER — Other Ambulatory Visit: Payer: Self-pay

## 2015-01-19 ENCOUNTER — Encounter: Payer: Self-pay | Admitting: Nurse Practitioner

## 2015-01-19 ENCOUNTER — Ambulatory Visit (INDEPENDENT_AMBULATORY_CARE_PROVIDER_SITE_OTHER): Payer: Medicaid Other | Admitting: Nurse Practitioner

## 2015-01-19 VITALS — BP 171/80 | HR 84 | Temp 97.6°F | Ht 67.0 in | Wt 358.4 lb

## 2015-01-19 DIAGNOSIS — K649 Unspecified hemorrhoids: Secondary | ICD-10-CM | POA: Diagnosis not present

## 2015-01-19 DIAGNOSIS — R1011 Right upper quadrant pain: Secondary | ICD-10-CM

## 2015-01-19 DIAGNOSIS — K810 Acute cholecystitis: Secondary | ICD-10-CM

## 2015-01-19 DIAGNOSIS — R109 Unspecified abdominal pain: Secondary | ICD-10-CM

## 2015-01-19 DIAGNOSIS — K8010 Calculus of gallbladder with chronic cholecystitis without obstruction: Secondary | ICD-10-CM

## 2015-01-19 DIAGNOSIS — K625 Hemorrhage of anus and rectum: Secondary | ICD-10-CM

## 2015-01-19 MED ORDER — LANSOPRAZOLE 30 MG PO CPDR: 30.0000 mg | DELAYED_RELEASE_CAPSULE | Freq: Every day | ORAL | Status: DC

## 2015-01-19 MED ORDER — HYDROCORTISONE 2.5 % RE CREA: 1.0000 "application " | TOPICAL_CREAM | Freq: Two times a day (BID) | RECTAL | Status: DC

## 2015-01-19 NOTE — Progress Notes (Signed)
Referring Provider: Toma Deiters, MD Primary Care Physician:  Toma Deiters, MD Primary GI:  Dr. Jena Gauss  Chief Complaint  Patient presents with  . Abdominal Pain  . Rectal Bleeding    HPI:   29 year old male presents for evaluation of abdominal pain and hemorrhoids with a history of erosive esophagitis and bleeding gastric ulcers. Telephone note dated 01/12/2015 with patient said he was having abdominal pain , hitroy of bleeding ulcers, not taking anything for reflux. He was previously on Protonix when in the hospital 07/28/14 and didn't take anything after that. Prescription for 2 weeks of lansoprazole 30 mg was provided on 01/12/15. Emergency room visit for 01/11/2015 was reviewed, patient presented for sudden onset rectal bleeding, nausea, vomiting, diarrhea. Bleeding was felt to be due to his history of hemorrhoids and he was discharged with Anusol suppositories and Zofran for nausea. Previous hospitalization admission this year on 07/28/2014 for erosive esophagitis and abdominal pain. Endoscopy completed at that time found previously noted gastric ulcer completely healed and was recommended to decrease lansoprazole to 30 mg once daily and establish with a primary care provider. Colonoscopy on the same day found anal canal/internal hemorrhoids, otherwise normal colonoscopy. Likely trivial GI bleed. At hospital discharge his recommend a follow-up with GI in 2 weeks, however this was not done.  Today he states he is still having hemorrhoids, cannot use suppositories because they hurt. States his abdominal pain is persistent, even with PPI. CT done 07/2014 with no acute process, fatty liver, cholelithiasis stable from previous exam. Abdominal pain is periumbilical and RUQ, described as twisting/squeezing. No worse after eating. HIDA scan completed 02/10/2014 found markedly decreased gallbladder ejection fraction of 6%, asymptomatic with CCK administration although an surgical consult note during  that hospitalization patient stated he was symptomatic. Surgery was reluctant to perform cholecystectomy due to patient morbid obesity as a high risk factor. Has bright red blood in his stool with a history of hemorrhoids and is in the water and on the toilet tissue. Also with rectal pain and "bulging" sensation. Denies N/V, fevers, unintentional weight loss. Denies chest pain, dyspnea, dizziness, lightheadedness, syncope, near syncope. Denies any other upper or lower GI symptoms.   Past Medical History  Diagnosis Date  . Gout   . Cellulitis   . Obesity   . Erosive esophagitis 09/01/2012    NSAID induced.  . Gastric ulcer with hemorrhage 09/02/2012    s/p bleeding control tx. Per Dr. Jena Gauss  . Acute blood loss anemia 09/02/2012    S/p 1 unit rbcs  . UTI (lower urinary tract infection)   . Gall stones     Past Surgical History  Procedure Laterality Date  . Tonsillectomy    . Esophagogastroduodenoscopy Left 09/01/2012    WGN:FAOZHY esophageal erosions consistent with erosive reflux esophagitis/Pre-pyloric benign-appearing gastric ulcer with bleeding stigmata status post bleeding control therapy as described above  . Esophagogastroduodenoscopy N/A 12/13/2012    QMV:HQIONG hernia. PReviously noted gastric ulcer completely healed.   . Colonoscopy with propofol N/A 07/30/2014    RMR: Colonoscopy anal canal/internal hemorrhoids, otherwise normal ileocolonoscoopy.   . Esophagogastroduodenoscopy (egd) with propofol N/A 07/30/2014    Procedure: ESOPHAGOGASTRODUODENOSCOPY (EGD) WITH PROPOFOL;  Surgeon: Corbin Ade, MD;  Location: [facility];  Service: Endoscopy;  Laterality: N/A;    Current Outpatient Prescriptions  Medication Sig Dispense Refill  . acetaminophen (TYLENOL) 325 MG tablet Take 650-975 mg by mouth daily as needed for mild pain or moderate pain.    Marland Kitchen albuterol (  PROVENTIL HFA;VENTOLIN HFA) 108 (90 BASE) MCG/ACT inhaler Inhale 2 puffs into the lungs every 6 (six) hours as needed for wheezing  or shortness of breath. 1 Inhaler 2  . gabapentin (NEURONTIN) 300 MG capsule Take 600 mg by mouth 3 (three) times daily.     . hydrocortisone (ANUSOL-HC) 25 MG suppository Place 1 suppository (25 mg total) rectally 2 (two) times daily. 12 suppository 0  . ondansetron (ZOFRAN ODT) 4 MG disintegrating tablet Take 1 tablet (4 mg total) by mouth every 8 (eight) hours as needed for nausea or vomiting. 20 tablet 0  . allopurinol (ZYLOPRIM) 300 MG tablet Take 300 mg by mouth daily.    . benazepril (LOTENSIN) 5 MG tablet Take 5 mg by mouth daily.    . lansoprazole (PREVACID) 30 MG capsule Take 1 capsule (30 mg total) by mouth daily. (Patient not taking: Reported on 01/19/2015) 14 capsule 0  . pantoprazole (PROTONIX) 40 MG tablet Take 1 tablet (40 mg total) by mouth 2 (two) times daily. (Patient not taking: Reported on 01/19/2015) 30 tablet 0  . Phenyleph-Doxylamine-DM-APAP (VICKS DAYQUIL/NYQUIL CLD & FLU PO) Take 1-2 capsules by mouth daily as needed (for cold?flu symtoms).     No current facility-administered medications for this visit.    Allergies as of 01/19/2015 - Review Complete 01/19/2015  Allergen Reaction Noted  . Asa [aspirin] Other (See Comments) 03/05/2012  . Nsaids Other (See Comments) 06/25/2013  . Penicillins Other (See Comments) 03/05/2012  . Sulfa antibiotics Other (See Comments) 03/05/2012    Family History  Problem Relation Age of Onset  . Heart failure Mother     Social History   Social History  . Marital Status: Single    Spouse Name: N/A  . Number of Children: N/A  . Years of Education: N/A   Social History Main Topics  . Smoking status: Light Tobacco Smoker -- 0.33 packs/day for 7 years    Types: Cigarettes  . Smokeless tobacco: Current User    Types: Snuff  . Alcohol Use: No  . Drug Use: Yes    Special: Marijuana  . Sexual Activity: No   Other Topics Concern  . None   Social History Narrative    Review of Systems: General: Negative for anorexia, weight  loss, fever, chills, fatigue, weakness. Eyes: Negative for vision changes.  ENT: Negative for hoarseness, difficulty swallowing. CV: Negative for chest pain, angina, palpitations, peripheral edema.  Respiratory: Negative for dyspnea at rest, cough, sputum, wheezing.  GI: See history of present illness. Derm: Negative for rash or itching.  Endo: Negative for unusual weight change.  Heme: Negative for bruising or bleeding. Allergy: Negative for rash or hives.   Physical Exam: BP 171/80 mmHg  Pulse 84  Temp(Src) 97.6 F (36.4 C) (Oral)  Ht  (1.702 m)  Wt 358 lb 6.4 oz (162.569 kg)  BMI 56.12 kg/m2 General:   Alert and oriented. Morbidly obese. Pleasant and cooperative. Well-nourished and well-developed.  Head:  Normocephalic and atraumatic. Eyes:  Without icterus, sclera clear and conjunctiva pink.  Ears:  Normal auditory acuity. Cardiovascular:  S1, S2 present without murmurs appreciated. Extremities without clubbing or edema. Respiratory:  Clear to auscultation bilaterally. No wheezes, rales, or rhonchi. No distress.  Gastrointestinal:  +BS, morbidly obese, soft, and non-distended. TTP RUQ and right-side abdomen. No HSM noted. No guarding or rebound. No masses appreciated.  Rectal:  Deferred  Neurologic:  Alert and oriented x4;  grossly normal neurologically. Psych:  Alert and cooperative. Normal mood and  affect.    01/19/2015 11:12 AM

## 2015-01-19 NOTE — Patient Instructions (Signed)
1. I have refilled your acid blocker. Continue to take this as you have been. 2. Since in a prescription for Anusol cream to help with her hemorrhoids. 3. We will refer you back to Dr. Lovell Sheehan in surgery to reevaluate her current symptoms.

## 2015-01-20 NOTE — Assessment & Plan Note (Signed)
Occasional rectal bleeding. Rectal bleeding noted on emergency room visit deemed likely due to hemorrhoids. Colonoscopy up-to-date which was normal and noted internal hemorrhoids. Symptoms continue could possibly be a candidate for hemorrhoid banding.

## 2015-01-20 NOTE — Assessment & Plan Note (Signed)
Gallstones noted on imaging with HIDA scan showing markedly decreased ejection fraction of 6%. Chronic cholecystitis most likely cause of patient's symptoms. Recent CT imaging, colonoscopy, endoscopy all essentially normal. Referral to surgery as noted below.

## 2015-01-20 NOTE — Assessment & Plan Note (Signed)
Patient with a history of hemorrhoids with rectal pain/irritation and some rectal bleeding as noted below. Navel take Anusol suppositories because her painful to insert. We'll provide Anusol cream instead to help with his hemorrhoid symptoms.

## 2015-01-20 NOTE — Assessment & Plan Note (Addendum)
Patient with persistent right upper quadrant pain. Extensive workup including recent endoscopy, colonoscopy, and CT scans all with no acute process. HIDA scan completed which showed markedly decreased ejection fraction of 6%. Surgical consult note wall hospitalized noted not apparent acute cholecystitis despite noted cholelithiasis. Surgery reluctant to perform laparoscopic cholecystectomy due to patient high risk with morbid obesity. At this point is appears to be the only thing left to explain his pain. He does have a history of bleeding ulcers however his most recent endoscopy this year showed the ulcer to be healed and he continues on PPI therapy. We will be referred to surgery for consideration of surgical intervention at their discretion.

## 2015-01-20 NOTE — Progress Notes (Signed)
cc'ed to pcp °

## 2015-04-09 ENCOUNTER — Emergency Department (HOSPITAL_COMMUNITY)
Admission: EM | Admit: 2015-04-09 | Discharge: 2015-04-09 | Disposition: A | Discharge status: Home / Self Care | Payer: Medicaid Other | Attending: Emergency Medicine | Admitting: Emergency Medicine

## 2015-04-09 ENCOUNTER — Encounter (HOSPITAL_COMMUNITY): Payer: Self-pay | Admitting: Emergency Medicine

## 2015-04-09 DIAGNOSIS — R062 Wheezing: Secondary | ICD-10-CM | POA: Insufficient documentation

## 2015-04-09 DIAGNOSIS — Z862 Personal history of diseases of the blood and blood-forming organs and certain disorders involving the immune mechanism: Secondary | ICD-10-CM | POA: Diagnosis not present

## 2015-04-09 DIAGNOSIS — Z8719 Personal history of other diseases of the digestive system: Secondary | ICD-10-CM | POA: Diagnosis not present

## 2015-04-09 DIAGNOSIS — Z880 Allergy status to penicillin: Secondary | ICD-10-CM | POA: Diagnosis not present

## 2015-04-09 DIAGNOSIS — M778 Other enthesopathies, not elsewhere classified: Secondary | ICD-10-CM | POA: Diagnosis not present

## 2015-04-09 DIAGNOSIS — Z87442 Personal history of urinary calculi: Secondary | ICD-10-CM | POA: Insufficient documentation

## 2015-04-09 DIAGNOSIS — F17210 Nicotine dependence, cigarettes, uncomplicated: Secondary | ICD-10-CM | POA: Insufficient documentation

## 2015-04-09 DIAGNOSIS — M25532 Pain in left wrist: Secondary | ICD-10-CM | POA: Diagnosis present

## 2015-04-09 DIAGNOSIS — Z79899 Other long term (current) drug therapy: Secondary | ICD-10-CM | POA: Insufficient documentation

## 2015-04-09 DIAGNOSIS — Z872 Personal history of diseases of the skin and subcutaneous tissue: Secondary | ICD-10-CM | POA: Insufficient documentation

## 2015-04-09 DIAGNOSIS — E669 Obesity, unspecified: Secondary | ICD-10-CM | POA: Diagnosis not present

## 2015-04-09 DIAGNOSIS — M109 Gout, unspecified: Secondary | ICD-10-CM | POA: Diagnosis not present

## 2015-04-09 MED ORDER — ACETAMINOPHEN-CODEINE #3 300-30 MG PO TABS: 1.0000 | ORAL_TABLET | Freq: Four times a day (QID) | ORAL | Status: DC | PRN

## 2015-04-09 MED ORDER — DEXAMETHASONE 6 MG PO TABS: ORAL_TABLET | ORAL | Status: DC

## 2015-04-09 NOTE — Discharge Instructions (Signed)
Please use a heating pad to the left wrist when possible. Use decadron and tylenol daily. Use tylenol codeine for more severe pain. See Dr Romeo AppleHarrison for additional evaluation if not improving.

## 2015-04-09 NOTE — ED Notes (Signed)
Pt reports wrist pain while sewing last night and has limited ROM and decreased sensation to third and fourth fingers on left wrist.  Cap refill present, radial pulse 2+.  Pt has hx of gout

## 2015-04-09 NOTE — ED Notes (Signed)
Wrist splint applied. CSM intact after application,.

## 2015-04-09 NOTE — ED Provider Notes (Signed)
CSN: [id number]     Arrival date & time 04/09/15  [id number] History   First MD Initiated Contact with Patient 04/09/15 1028     Chief Complaint  Patient presents with  . Wrist Pain     (Consider location/radiation/quality/duration/timing/severity/associated sxs/prior Treatment) Patient is a 29 y.o. male presenting with wrist pain. The history is provided by the patient.  Wrist Pain This is a new problem. The current episode started yesterday. The problem occurs intermittently. The problem has been gradually worsening. Associated symptoms include arthralgias and joint swelling. Pertinent negatives include no fever or rash. Exacerbated by: bending and extending the left wrist. He has tried nothing for the symptoms. The treatment provided no relief.    Past Medical History  Diagnosis Date  . Gout   . Cellulitis   . Obesity   . Erosive esophagitis 09/01/2012    NSAID induced.  . Gastric ulcer with hemorrhage 09/02/2012    s/p bleeding control tx. Per Dr. Jena Gaussourk  . Acute blood loss anemia 09/02/2012    S/p 1 unit rbcs  . UTI (lower urinary tract infection)   . Gall stones    Past Surgical History  Procedure Laterality Date  . Tonsillectomy    . Esophagogastroduodenoscopy Left 09/01/2012    JXB:JYNWGNRMR:Distal esophageal erosions consistent with erosive reflux esophagitis/Pre-pyloric benign-appearing gastric ulcer with bleeding stigmata status post bleeding control therapy as described above  . Esophagogastroduodenoscopy N/A 12/13/2012    FAO:ZHYQMVRMR:Hiatal hernia. PReviously noted gastric ulcer completely healed.   . Colonoscopy with propofol N/A 07/30/2014    RMR: Colonoscopy anal canal/internal hemorrhoids, otherwise normal ileocolonoscoopy.   . Esophagogastroduodenoscopy (egd) with propofol N/A 07/30/2014    Procedure: ESOPHAGOGASTRODUODENOSCOPY (EGD) WITH PROPOFOL;  Surgeon: Corbin Adeobert M Rourk, MD;  Location: [facility];  Service: Endoscopy;  Laterality: N/A;   Family History  Problem Relation Age of Onset  .  Heart failure Mother   . Colon cancer Neg Hx    Social History  Substance Use Topics  . Smoking status: Heavy Tobacco Smoker -- 0.50 packs/day for 7 years    Types: Cigarettes  . Smokeless tobacco: Current User    Types: Snuff     Comment: daily in the evenings\  . Alcohol Use: 0.0 oz/week    0 Standard drinks or equivalent per week     Comment: rarely    Review of Systems  Constitutional: Negative for fever.  Musculoskeletal: Positive for joint swelling and arthralgias.  Skin: Negative for rash.  All other systems reviewed and are negative.     Allergies  Asa; Clindamycin/lincomycin; Nsaids; Penicillins; and Sulfa antibiotics  Home Medications   Prior to Admission medications   Medication Sig Start Date End Date Taking? Authorizing Provider  acetaminophen (TYLENOL) 325 MG tablet Take 650-975 mg by mouth daily as needed for mild pain or moderate pain.   Yes Historical Provider, MD  calcium carbonate (TUMS - DOSED IN MG ELEMENTAL CALCIUM) 500 MG chewable tablet Chew 1 tablet by mouth 2 (two) times daily.   Yes Historical Provider, MD  gabapentin (NEURONTIN) 300 MG capsule Take 600 mg by mouth 3 (three) times daily as needed (pain).    Yes Historical Provider, MD   BP 152/103 mmHg  Pulse 91  Temp(Src) 97.7 F (36.5 C) (Oral)  Resp 22  Ht 5\' 7"  (1.702 m)  Wt 157.852 kg  BMI 54.49 kg/m2  SpO2 98% Physical Exam  Constitutional: He is oriented to person, place, and time. He appears well-developed and well-nourished.  Non-toxic appearance.  HENT:  Head: Normocephalic.  Right Ear: Tympanic membrane and external ear normal.  Left Ear: Tympanic membrane and external ear normal.  Eyes: EOM and lids are normal. Pupils are equal, round, and reactive to light.  Neck: Normal range of motion. Neck supple. Carotid bruit is not present.  Cardiovascular: Normal rate, regular rhythm, normal heart sounds, intact distal pulses and normal pulses.   Pulmonary/Chest: Breath sounds normal.  No respiratory distress.  Scattered wheezes present. Symmetrical rise and fall of the chest. Pt speaks in complete sentences.  Abdominal: Soft. Bowel sounds are normal. There is no tenderness. There is no guarding.  Musculoskeletal: He exhibits tenderness.       Left wrist: He exhibits decreased range of motion and tenderness. He exhibits no deformity.  Pain with flex/extention of the left wrist. Pain with pronation. No hot or red areas of the left upper extremity. There is swelling and tenderness of the PIP of the ring finger on the left, and tenderness with flexion and extension of the long finger on the left.  Lymphadenopathy:       Head (right side): No submandibular adenopathy present.       Head (left side): No submandibular adenopathy present.    He has no cervical adenopathy.  Neurological: He is alert and oriented to person, place, and time. He has normal strength. No cranial nerve deficit or sensory deficit.  Skin: Skin is warm and dry.  Psychiatric: He has a normal mood and affect. His speech is normal.  Nursing note and vitals reviewed.   ED Course  Procedures (including critical care time) Labs Review Labs Reviewed - No data to display  Imaging Review No results found. I have personally reviewed and evaluated these images and lab results as part of my medical decision-making.   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  Patient is allergic to aspirin and NSAIDs. Patient will be placed on Decadron and Tylenol codeine. A wrist splint has been provided. Patient is referred to orthopedics for additional evaluation.    Final diagnoses:  None    *I have reviewed nursing notes, vital signs, and all appropriate lab and imaging results for this patient.Ivery Quale, PA-C 04/09/15 1149  Marily Memos, MD 04/09/15 [phone]

## 2015-05-07 ENCOUNTER — Encounter (HOSPITAL_COMMUNITY): Payer: Self-pay

## 2015-05-07 ENCOUNTER — Emergency Department (HOSPITAL_COMMUNITY)
Admission: EM | Admit: 2015-05-07 | Discharge: 2015-05-07 | Disposition: A | Discharge status: Home / Self Care | Payer: Medicaid Other | Attending: Emergency Medicine | Admitting: Emergency Medicine

## 2015-05-07 ENCOUNTER — Emergency Department (HOSPITAL_COMMUNITY): Payer: Medicaid Other

## 2015-05-07 DIAGNOSIS — Z872 Personal history of diseases of the skin and subcutaneous tissue: Secondary | ICD-10-CM | POA: Insufficient documentation

## 2015-05-07 DIAGNOSIS — Z79899 Other long term (current) drug therapy: Secondary | ICD-10-CM | POA: Diagnosis not present

## 2015-05-07 DIAGNOSIS — Y9289 Other specified places as the place of occurrence of the external cause: Secondary | ICD-10-CM | POA: Insufficient documentation

## 2015-05-07 DIAGNOSIS — S99911A Unspecified injury of right ankle, initial encounter: Secondary | ICD-10-CM | POA: Insufficient documentation

## 2015-05-07 DIAGNOSIS — Z8719 Personal history of other diseases of the digestive system: Secondary | ICD-10-CM | POA: Diagnosis not present

## 2015-05-07 DIAGNOSIS — E669 Obesity, unspecified: Secondary | ICD-10-CM | POA: Insufficient documentation

## 2015-05-07 DIAGNOSIS — Y9389 Activity, other specified: Secondary | ICD-10-CM | POA: Diagnosis not present

## 2015-05-07 DIAGNOSIS — W01198A Fall on same level from slipping, tripping and stumbling with subsequent striking against other object, initial encounter: Secondary | ICD-10-CM | POA: Diagnosis not present

## 2015-05-07 DIAGNOSIS — Y998 Other external cause status: Secondary | ICD-10-CM | POA: Insufficient documentation

## 2015-05-07 DIAGNOSIS — F17210 Nicotine dependence, cigarettes, uncomplicated: Secondary | ICD-10-CM | POA: Insufficient documentation

## 2015-05-07 DIAGNOSIS — Z87440 Personal history of urinary (tract) infections: Secondary | ICD-10-CM | POA: Insufficient documentation

## 2015-05-07 DIAGNOSIS — M25521 Pain in right elbow: Secondary | ICD-10-CM

## 2015-05-07 DIAGNOSIS — M109 Gout, unspecified: Secondary | ICD-10-CM | POA: Diagnosis not present

## 2015-05-07 DIAGNOSIS — Z880 Allergy status to penicillin: Secondary | ICD-10-CM | POA: Diagnosis not present

## 2015-05-07 DIAGNOSIS — M25571 Pain in right ankle and joints of right foot: Secondary | ICD-10-CM

## 2015-05-07 DIAGNOSIS — S59901A Unspecified injury of right elbow, initial encounter: Secondary | ICD-10-CM | POA: Diagnosis not present

## 2015-05-07 MED ORDER — ACETAMINOPHEN 325 MG PO TABS: 650.0000 mg | ORAL_TABLET | Freq: Every day | ORAL | Status: DC | PRN

## 2015-05-07 MED ORDER — HYDROCODONE-ACETAMINOPHEN 5-325 MG PO TABS
1.0000 | ORAL_TABLET | Freq: Once | ORAL | Status: AC
  Administered 2015-05-07: 1 via ORAL
  Filled 2015-05-07: qty 1

## 2015-05-07 MED ORDER — OXYCODONE-ACETAMINOPHEN 5-325 MG PO TABS: 1.0000 | ORAL_TABLET | Freq: Three times a day (TID) | ORAL | Status: DC | PRN

## 2015-05-07 NOTE — ED Notes (Signed)
Pt reports was walking through his house yesterday and tripped and fell.  C/O pain to r ankle and foot.  Also reports pain to r elbow.  Swelling noted.

## 2015-05-07 NOTE — ED Provider Notes (Signed)
CSN: [id number]     Arrival date & time 05/07/15  7829 History  By signing my name below, I, Marica Otter, attest that this documentation has been prepared under the direction and in the presence of Marily Memos, MD. Electronically Signed: Marica Otter, ED Scribe. 05/07/2015. 8:38 AM.  Chief Complaint  Patient presents with  . Foot Pain   The history is provided by the patient. No language interpreter was used.   PCP: Toma Deiters, MD HPI Comments: [name] is a 30 y.o. male, with PMHx noted below, who presents to the Emergency Department complaining of traumatic, acute, 10/10, constant right ankle pain with associated swelling and right elbow pain onset yesterday after pt tripped and fell; twisting his right ankle and hitting his right elbow on a wall in the process. Pt reports icing the affected area, keeping his right leg elevated, and applying topical muscle pain reliever ointment without relief. Pt reports an allergy to ibuprofen stating that he took aleve for an extended period of time 2 years ago and "it made a hole in [his] stomach."  Past Medical History  Diagnosis Date  . Gout   . Cellulitis   . Obesity   . Erosive esophagitis 09/01/2012    NSAID induced.  . Gastric ulcer with hemorrhage 09/02/2012    s/p bleeding control tx. Per Dr. Jena Gauss  . Acute blood loss anemia 09/02/2012    S/p 1 unit rbcs  . UTI (lower urinary tract infection)   . Gall stones    Past Surgical History  Procedure Laterality Date  . Tonsillectomy    . Esophagogastroduodenoscopy Left 09/01/2012    FAO:ZHYQMV esophageal erosions consistent with erosive reflux esophagitis/Pre-pyloric benign-appearing gastric ulcer with bleeding stigmata status post bleeding control therapy as described above  . Esophagogastroduodenoscopy N/A 12/13/2012    HQI:ONGEXB hernia. PReviously noted gastric ulcer completely healed.   . Colonoscopy with propofol N/A 07/30/2014    RMR: Colonoscopy anal canal/internal  hemorrhoids, otherwise normal ileocolonoscoopy.   . Esophagogastroduodenoscopy (egd) with propofol N/A 07/30/2014    Procedure: ESOPHAGOGASTRODUODENOSCOPY (EGD) WITH PROPOFOL;  Surgeon: Corbin Ade, MD;  Location: [facility];  Service: Endoscopy;  Laterality: N/A;   Family History  Problem Relation Age of Onset  . Heart failure Mother   . Colon cancer Neg Hx    Social History  Substance Use Topics  . Smoking status: Heavy Tobacco Smoker -- 0.50 packs/day for 7 years    Types: Cigarettes  . Smokeless tobacco: Current User    Types: Snuff     Comment: daily in the evenings\  . Alcohol Use: 0.0 oz/week    0 Standard drinks or equivalent per week     Comment: rarely    Review of Systems  Constitutional: Negative for fever.  Musculoskeletal: Positive for joint swelling and arthralgias.  All other systems reviewed and are negative.  Allergies  Asa; Clindamycin/lincomycin; Nsaids; Penicillins; and Sulfa antibiotics  Home Medications   Prior to Admission medications   Medication Sig Start Date End Date Taking? Authorizing Provider  acetaminophen (TYLENOL) 325 MG tablet Take 2-3 tablets (650-975 mg total) by mouth daily as needed for mild pain or moderate pain. 05/07/15   Marily Memos, MD  acetaminophen-codeine (TYLENOL #3) 300-30 MG tablet Take 1-2 tablets by mouth every 6 (six) hours as needed. 04/09/15   Ivery Quale, PA-C  calcium carbonate (TUMS - DOSED IN MG ELEMENTAL CALCIUM) 500 MG chewable tablet Chew 1 tablet by mouth 2 (two) times daily.  Historical Provider, MD  dexamethasone (DECADRON) 6 MG tablet 1 po bid with food 04/09/15   Ivery Quale, PA-C  gabapentin (NEURONTIN) 300 MG capsule Take 600 mg by mouth 3 (three) times daily as needed (pain).     Historical Provider, MD  oxyCODONE-acetaminophen (PERCOCET) 5-325 MG tablet Take 1-2 tablets by mouth every 8 (eight) hours as needed. 05/07/15   Marily Memos, MD   Triage Vitals: BP 132/76 mmHg  Pulse 97  Temp(Src) 98.2 F  (36.8 C) (Oral)  Resp 20  Ht  (1.702 m)  Wt 348 lb (157.852 kg)  BMI 54.49 kg/m2  SpO2 98% Physical Exam  Constitutional: He appears well-developed and well-nourished. No distress.  HENT:  Head: Normocephalic and atraumatic.  Neck: Neck supple.  Cardiovascular: Normal rate and regular rhythm.   Pulmonary/Chest: Effort normal.  Musculoskeletal: He exhibits tenderness.       Right ankle: He exhibits no swelling, no ecchymosis and no deformity. Tenderness.  Right Elbow: Tenderness with flexion and pronation. Swelling around lateral part of elbow. Resists movement one way or the other. Resists pronation and supination.   Neurological: He is alert.  Skin: He is not diaphoretic.  BLE: Excoriated, erythematous rash that is not tender to palpation, not raised, no warmth.   Nursing note and vitals reviewed.  ED Course  Procedures (including critical care time) DIAGNOSTIC STUDIES: Oxygen Saturation is 98% on ra, nl by my interpretation.    COORDINATION OF CARE: 8:33 AM: Discussed treatment plan which includes imaging results, RICE, crutches, ortho referral and f/u with ortho if Sx do not improve in one week, meds for pain management, with pt at bedside; patient verbalizes understanding and agrees with treatment plan.   Imaging Review Dg Elbow Complete Right  05/07/2015  CLINICAL DATA:  Acute right elbow pain after tripping at home yesterday. Initial encounter. EXAM: RIGHT ELBOW - COMPLETE 3+ VIEW COMPARISON:  None. FINDINGS: There is no evidence of fracture, dislocation, or joint effusion. There is no evidence of arthropathy or other focal bone abnormality. Soft tissues are unremarkable. IMPRESSION: Normal right elbow. Electronically Signed   By: Lupita Raider, M.D.   On: 05/07/2015 08:24   Dg Ankle Complete Right  05/07/2015  CLINICAL DATA:  Acute right ankle pain after tripping at home yesterday. Initial encounter. EXAM: RIGHT ANKLE - COMPLETE 3+ VIEW COMPARISON:  None. FINDINGS:  There is no evidence of fracture, dislocation, or joint effusion. Narrowing and osteophyte formation is seen involving the talotibial joint. Soft tissues are unremarkable. 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 No acute abnormality seen in the right ankle. Electronically Signed   By: Lupita Raider, M.D.   On: 05/07/2015 08:22   Dg Foot Complete Right  05/07/2015  CLINICAL DATA:  Diffuse pain after tripping injury 1 day prior EXAM: RIGHT FOOT COMPLETE - 3+ VIEW COMPARISON:  July 11, 2010 FINDINGS: Frontal, oblique, lateral views were obtained. There is no demonstrable fracture or dislocation. There is spurring arising from the dorsal midfoot, stable. There is a small posterior calcaneal spur. There is no appreciable joint space narrowing. No erosive change. IMPRESSION: Spurring dorsal midfoot. Small posterior calcaneal spur. No fracture or dislocation. Electronically Signed   By: Bretta Bang III M.D.   On: 05/07/2015 08:22  images  I have personally reviewed and evaluated these images as part of my medical decision-making.  MDM   Final diagnoses:  Ankle pain, right  Elbow pain, right    Mechanical fall last night with  elbow and ankle pain today. Able to bear weight but with pain. xr's negative. Resists ROM to right elbow, can actively feel triceps and biceps engaging, likely 2/2 pain. Will give splint, crutches and recommend ortho follow up if not improving in a week.   I personally performed the services described in this documentation, which was scribed in my presence. The recorded information has been reviewed and is accurate.     Marily Memos, MD 05/07/15 [phone]

## 2015-08-04 ENCOUNTER — Emergency Department (HOSPITAL_COMMUNITY)
Admission: EM | Admit: 2015-08-04 | Discharge: 2015-08-04 | Disposition: A | Discharge status: Home / Self Care | Payer: Medicaid Other | Attending: Emergency Medicine | Admitting: Emergency Medicine

## 2015-08-04 ENCOUNTER — Encounter (HOSPITAL_COMMUNITY): Payer: Self-pay

## 2015-08-04 ENCOUNTER — Emergency Department (HOSPITAL_COMMUNITY): Payer: Medicaid Other

## 2015-08-04 DIAGNOSIS — S6992XA Unspecified injury of left wrist, hand and finger(s), initial encounter: Secondary | ICD-10-CM | POA: Diagnosis present

## 2015-08-04 DIAGNOSIS — F17210 Nicotine dependence, cigarettes, uncomplicated: Secondary | ICD-10-CM | POA: Diagnosis not present

## 2015-08-04 DIAGNOSIS — E669 Obesity, unspecified: Secondary | ICD-10-CM | POA: Insufficient documentation

## 2015-08-04 DIAGNOSIS — W109XXA Fall (on) (from) unspecified stairs and steps, initial encounter: Secondary | ICD-10-CM | POA: Diagnosis not present

## 2015-08-04 DIAGNOSIS — M25552 Pain in left hip: Secondary | ICD-10-CM | POA: Insufficient documentation

## 2015-08-04 DIAGNOSIS — S43402A Unspecified sprain of left shoulder joint, initial encounter: Secondary | ICD-10-CM | POA: Diagnosis not present

## 2015-08-04 DIAGNOSIS — S63502A Unspecified sprain of left wrist, initial encounter: Secondary | ICD-10-CM | POA: Insufficient documentation

## 2015-08-04 DIAGNOSIS — Y939 Activity, unspecified: Secondary | ICD-10-CM | POA: Diagnosis not present

## 2015-08-04 DIAGNOSIS — Y999 Unspecified external cause status: Secondary | ICD-10-CM | POA: Insufficient documentation

## 2015-08-04 DIAGNOSIS — Y929 Unspecified place or not applicable: Secondary | ICD-10-CM | POA: Insufficient documentation

## 2015-08-04 MED ORDER — HYDROCODONE-ACETAMINOPHEN 7.5-325 MG PO TABS: 1.0000 | ORAL_TABLET | Freq: Four times a day (QID) | ORAL | Status: DC | PRN

## 2015-08-04 MED ORDER — OXYCODONE-ACETAMINOPHEN 5-325 MG PO TABS
2.0000 | ORAL_TABLET | Freq: Once | ORAL | Status: AC
  Administered 2015-08-04: 2 via ORAL
  Filled 2015-08-04: qty 2

## 2015-08-04 NOTE — ED Notes (Signed)
Patient verbalizes understanding of discharge instructions, prescriptions, home care and follow up care if needed. Patient out of department at this time.

## 2015-08-04 NOTE — ED Notes (Addendum)
Pt reports slipped and missed his last step and fell.  Pt c/o pain to left hip, left wrist, and left shoulder.  Swelling noted to left wrist.  Radial pulse present, pt can move fingers but limited.  Pt ambulatory.

## 2015-08-04 NOTE — ED Provider Notes (Signed)
CSN: [id number]     Arrival date & time 08/04/15  1237 History   First MD Initiated Contact with Patient 08/04/15 1257     Chief Complaint  Patient presents with  . Fall     (Consider location/radiation/quality/duration/timing/severity/associated sxs/prior Treatment) HPI   [name] is a 30 y.o. male who presents to the Emergency Department complaining of left shoulder, wrist and hip pain after a fall that occurred shortly before ED arrival.  States that he slipped and missed one step, fell landing on his side.  Pain is worse with movement .  He denies head injury, LOC, neck or back pain, numbness or weakness.  He has not tried any medications or therapies prior to arrival     Past Medical History  Diagnosis Date  . Gout   . Cellulitis   . Obesity   . Erosive esophagitis 09/01/2012    NSAID induced.  . Gastric ulcer with hemorrhage 09/02/2012    s/p bleeding control tx. Per Dr. Jena Gauss  . Acute blood loss anemia 09/02/2012    S/p 1 unit rbcs  . UTI (lower urinary tract infection)   . Gall stones    Past Surgical History  Procedure Laterality Date  . Tonsillectomy    . Esophagogastroduodenoscopy Left 09/01/2012    VOZ:[id number] esophageal erosions consistent with erosive reflux esophagitis/Pre-pyloric benign-appearing gastric ulcer with bleeding stigmata status post bleeding control therapy as described above  . Esophagogastroduodenoscopy N/A 12/13/2012    [id number]:[id number] hernia. PReviously noted gastric ulcer completely healed.   . Colonoscopy with propofol N/A 07/30/2014    RMR: Colonoscopy anal canal/internal hemorrhoids, otherwise normal ileocolonoscoopy.   . Esophagogastroduodenoscopy (egd) with propofol N/A 07/30/2014    Procedure: ESOPHAGOGASTRODUODENOSCOPY (EGD) WITH PROPOFOL;  Surgeon: Corbin Ade, MD;  Location: [facility];  Service: Endoscopy;  Laterality: N/A;   Family History  Problem Relation Age of Onset  . Heart failure Mother   . Colon cancer Neg Hx    Social  History  Substance Use Topics  . Smoking status: Heavy Tobacco Smoker -- 0.50 packs/day for 7 years    Types: Cigarettes  . Smokeless tobacco: Current User    Types: Snuff     Comment: daily in the evenings\  . Alcohol Use: 0.0 oz/week    0 Standard drinks or equivalent per week     Comment: rarely    Review of Systems  Constitutional: Negative for fever and chills.  Genitourinary: Negative for dysuria and difficulty urinating.  Musculoskeletal: Positive for joint swelling and arthralgias.  Skin: Negative for color change and wound.  All other systems reviewed and are negative.     Allergies  Asa; Clindamycin/lincomycin; Nsaids; Penicillins; and Sulfa antibiotics  Home Medications   Prior to Admission medications   Medication Sig Start Date End Date Taking? Authorizing Provider  acetaminophen (TYLENOL) 325 MG tablet Take 2-3 tablets (650-975 mg total) by mouth daily as needed for mild pain or moderate pain. 05/07/15   Marily Memos, MD  acetaminophen-codeine (TYLENOL #3) 300-30 MG tablet Take 1-2 tablets by mouth every 6 (six) hours as needed. 04/09/15   Ivery Quale, PA-C  calcium carbonate (TUMS - DOSED IN MG ELEMENTAL CALCIUM) 500 MG chewable tablet Chew 1 tablet by mouth 2 (two) times daily.    Historical Provider, MD  dexamethasone (DECADRON) 6 MG tablet 1 po bid with food 04/09/15   Ivery Quale, PA-C  gabapentin (NEURONTIN) 300 MG capsule Take 600 mg by mouth 3 (three) times daily as needed (pain).  Historical Provider, MD  HYDROcodone-acetaminophen (NORCO) 7.5-325 MG tablet Take 1 tablet by mouth every 6 (six) hours as needed for moderate pain. 08/04/15   Caliya Narine, PA-C  oxyCODONE-acetaminophen (PERCOCET) 5-325 MG tablet Take 1-2 tablets by mouth every 8 (eight) hours as needed. 05/07/15   Barbara CowerJason Mesner, MD   BP 150/87 mmHg  Pulse 123  Temp(Src) 99.4 F (37.4 C) (Oral)  Resp 24  Ht 5\' 7"  (1.702 m)  Wt 163.295 kg  BMI 56.37 kg/m2  SpO2 100% Physical Exam   Constitutional: He is oriented to person, place, and time. He appears well-developed and well-nourished. No distress.  HENT:  Head: Normocephalic and atraumatic.  Neck: Normal range of motion. Neck supple. No thyromegaly present.  Cardiovascular: Normal rate, regular rhythm and intact distal pulses.   No murmur heard. Pulmonary/Chest: Effort normal and breath sounds normal. No respiratory distress. He exhibits no tenderness.  Musculoskeletal: He exhibits tenderness. He exhibits no edema.  ttp of the anterior left shoulder.  Pain with abduction of the left arm and rotation of the shoulder.  Radial pulse is brisk, distal sensation intact, CR< 2 sec. Moderate edema and tenderness of the left wrist.  No bony deformity.  Compartments soft. Also tenderness of lateral left hip.    Lymphadenopathy:    He has no cervical adenopathy.  Neurological: He is alert and oriented to person, place, and time. He has normal strength. No sensory deficit. He exhibits normal muscle tone. Coordination normal.  Reflex Scores:      Tricep reflexes are 1+ on the right side and 2+ on the left side.      Bicep reflexes are 1+ on the right side and 2+ on the left side. Skin: Skin is warm and dry.  Nursing note and vitals reviewed.   ED Course  Procedures (including critical care time) Labs Review Labs Reviewed - No data to display  Imaging Review Dg Wrist Complete Left  08/04/2015  CLINICAL DATA:  Fall down stairs going to basement. Left wrist pain. Swelling of the wrist. EXAM: LEFT WRIST - COMPLETE 3+ VIEW COMPARISON:  08/24/2010 FINDINGS: Spurring along the scaphoid, stable from 2012. Mild triquetrum irregularity with some adjacent soft tissue swelling, but a well-defined triquetrum fracture is not seen. Pronator fat pad slightly indistinct but not overtly displaced. Degenerative spurring at the radiocarpal articulation. IMPRESSION: 1. There is some slight irregularity of the triquetrum which does not rise to the  level to be considered highly suspicious for fracture. 2. Ridging of the mid scaphoid, similar to the prior exam and accordingly not highly suspicious for fracture. 3. If there is a high clinical index of suspicion of occult injury, or if pain persists despite conservative therapy, CT or MRI may be warranted. Electronically Signed   By: Gaylyn RongWalter  Liebkemann M.D.   On: 08/04/2015 14:22   Dg Shoulder Left  08/04/2015  CLINICAL DATA:  30 year old male with a history of fall shoulder pain wrist pain hip pain. EXAM: LEFT SHOULDER - 2+ VIEW COMPARISON:  Chest x-ray 02/26/2014, CT 02/26/2014 FINDINGS: No acute fracture identified. No significant soft tissue swelling. Glenohumeral joint appears congruent. Unremarkable scapular Y-view. IMPRESSION: No displaced acute fracture identified. Signed, Yvone NeuJaime S. Loreta AveWagner, DO [facility] Electronically Signed   By: Gilmer MorJaime  Wagner D.O.   On: 08/04/2015 14:24   Dg Hip Unilat With Pelvis 2-3 Views Left  08/04/2015  CLINICAL DATA:  30 year old male with a history of fall with left hip, left wrist, and shoulder  pain EXAM: DG HIP (WITH OR WITHOUT PELVIS) 2-3V LEFT COMPARISON:  None. FINDINGS: There is no evidence of hip fracture or dislocation. There is no evidence of arthropathy or other focal bone abnormality. IMPRESSION: Negative. Signed, Yvone Neu. Loreta Ave, DO [facility] Electronically Signed   By: Gilmer Mor D.O.   On: 08/04/2015 14:29   I have personally reviewed and evaluated these images and lab results as part of my medical decision-making.   EKG Interpretation None      MDM   Final diagnoses:  Sprain of wrist, left, initial encounter  Shoulder sprain, left, initial encounter    Sling and wrist splint applied.  Pt ambulates with a steady giat.  No focal neuro deficits.  Advised to try symptomatic tx and arrange for close ortho f/u.  He agrees to  plan and appears stable for d/c.      Pauline Aus, PA-C 08/04/15 1653  Bethann Berkshire, MD 08/06/15 [phone]

## 2015-08-04 NOTE — Discharge Instructions (Signed)
Shoulder Sprain A shoulder sprain is a partial or complete tear in one of the tough, fiber-like tissues (ligaments) in the shoulder. The ligaments in the shoulder help to hold the shoulder in place. CAUSES This condition may be caused by:  A fall.  A hit to the shoulder.  A twist of the arm. RISK FACTORS This condition is more likely to develop in:  People who play sports.  People who have problems with balance or coordination. SYMPTOMS Symptoms of this condition include:  Pain when moving the shoulder.  Limited ability to move the shoulder.  Swelling and tenderness on top of the shoulder.  Warmth in the shoulder.  A change in the shape of the shoulder.  Redness or bruising on the shoulder. DIAGNOSIS This condition is diagnosed with a physical exam. During the exam, you may be asked to do simple exercises with your shoulder. You may also have imaging tests, such as X-rays, MRI, or a CT scan. These tests can show how severe the sprain is. TREATMENT This condition may be treated with:  Rest.  Pain medicine.  Ice.  A sling or brace. This is used to keep the arm still while the shoulder is healing.  Physical therapy or rehabilitation exercises. These help to improve the range of motion and strength of the shoulder.  Surgery (rare). Surgery may be needed if the sprain caused a joint to become unstable. Surgery may also be needed to reduce pain. Some people may develop ongoing shoulder pain or lose some range of motion in the shoulder. However, most people do not develop long-term problems. HOME CARE INSTRUCTIONS  Rest.  Take over-the-counter and prescription medicines only as told by your health care provider.  If directed, apply ice to the area:  Put ice in a plastic bag.  Place a towel between your skin and the bag.  Leave the ice on for 20 minutes, 2-3 times per day.  If you were given a shoulder sling or brace:  Wear it as told.  Remove it to shower or  bathe.  Move your arm only as much as told by your health care provider, but keep your hand moving to prevent swelling.  If you were shown how to do any exercises, do them as told by your health care provider.  Keep all follow-up visits as told by your health care provider. This is important. SEEK MEDICAL CARE IF:  Your pain gets worse.  Your pain is not relieved with medicines.  You have increased redness or swelling. SEEK IMMEDIATE MEDICAL CARE IF:  You have a fever.  You cannot move your arm or shoulder.  You develop numbness or tingling in your arms, hands, or fingers.   This information is not intended to replace advice given to you by your health care provider. Make sure you discuss any questions you have with your health care provider.   Document Released: 08/20/2008 Document Revised: 12/23/2014 Document Reviewed: 07/27/2014 Elsevier Interactive Patient Education 2016 Elsevier Inc.  Wrist Sprain A wrist sprain is a stretch or tear in the strong, fibrous tissues (ligaments) that connect your wrist bones. The ligaments of your wrist may be easily sprained. There are three types of wrist sprains.  Grade 1. The ligament is not stretched or torn, but the sprain causes pain.  Grade 2. The ligament is stretched or partially torn. You may be able to move your wrist, but not very much.  Grade 3. The ligament or muscle completely tears. You may find it  difficult or extremely painful to move your wrist even a little. CAUSES Often, wrist sprains are a result of a fall or an injury. The force of the impact causes the fibers of your ligament to stretch too much or tear. Common causes of wrist sprains include:  Overextending your wrist while catching a ball with your hands.  Repetitive or strenuous extension or bending of your wrist.  Landing on your hand during a fall. RISK FACTORS  Having previous wrist injuries.  Playing contact sports, such as boxing or  wrestling.  Participating in activities in which falling is common.  Having poor wrist strength and flexibility. SIGNS AND SYMPTOMS  Wrist pain.  Wrist tenderness.  Inflammation or bruising of the wrist area.  Hearing a "pop" or feeling a tear at the time of the injury.  Decreased wrist movement due to pain, stiffness, or weakness. DIAGNOSIS Your health care provider will examine your wrist. In some cases, an X-ray will be taken to make sure you did not break any bones. If your health care provider thinks that you tore a ligament, he or she may order an MRI of your wrist. TREATMENT Treatment involves resting and icing your wrist. You may also need to take pain medicines to help lessen pain and inflammation. Your health care provider may recommend keeping your wrist still (immobilized) with a splint to help your sprain heal. When the splint is no longer necessary, you may need to perform strengthening and stretching exercises. These exercises help you to regain strength and full range of motion in your wrist. Surgery is not usually needed for wrist sprains unless the ligament completely tears. HOME CARE INSTRUCTIONS  Rest your wrist. Do not do things that cause pain.  Wear your wrist splint as directed by your health care provider.  Take medicines only as directed by your health care provider.  To ease pain and swelling, apply ice to the injured area.  Put ice in a plastic bag.  Place a towel between your skin and the bag.  Leave the ice on for 20 minutes, 2-3 times a day. SEEK MEDICAL CARE IF:  Your pain, discomfort, or swelling gets worse even with treatment.  You feel sudden numbness in your hand.   This information is not intended to replace advice given to you by your health care provider. Make sure you discuss any questions you have with your health care provider.   Document Released: 12/05/2013 Document Reviewed: 12/05/2013 Elsevier Interactive Patient Education  [employer] Else[employer] Inc[employer]

## 2015-08-30 ENCOUNTER — Encounter (HOSPITAL_COMMUNITY): Payer: Self-pay

## 2015-08-30 ENCOUNTER — Emergency Department (HOSPITAL_COMMUNITY)
Admission: EM | Admit: 2015-08-30 | Discharge: 2015-08-30 | Disposition: A | Discharge status: Home / Self Care | Payer: Medicaid Other | Attending: Emergency Medicine | Admitting: Emergency Medicine

## 2015-08-30 DIAGNOSIS — E669 Obesity, unspecified: Secondary | ICD-10-CM | POA: Insufficient documentation

## 2015-08-30 DIAGNOSIS — F17210 Nicotine dependence, cigarettes, uncomplicated: Secondary | ICD-10-CM | POA: Insufficient documentation

## 2015-08-30 DIAGNOSIS — M79641 Pain in right hand: Secondary | ICD-10-CM | POA: Diagnosis present

## 2015-08-30 DIAGNOSIS — Z79899 Other long term (current) drug therapy: Secondary | ICD-10-CM | POA: Insufficient documentation

## 2015-08-30 DIAGNOSIS — M25531 Pain in right wrist: Secondary | ICD-10-CM | POA: Diagnosis not present

## 2015-08-30 MED ORDER — DEXAMETHASONE SODIUM PHOSPHATE 4 MG/ML IJ SOLN
8.0000 mg | Freq: Once | INTRAMUSCULAR | Status: AC
  Administered 2015-08-30: 8 mg via INTRAVENOUS
  Filled 2015-08-30: qty 2

## 2015-08-30 MED ORDER — SODIUM CHLORIDE 0.9 % IV BOLUS (SEPSIS)
500.0000 mL | Freq: Once | INTRAVENOUS | Status: AC
  Administered 2015-08-30: 500 mL via INTRAVENOUS

## 2015-08-30 MED ORDER — OXYCODONE-ACETAMINOPHEN 5-325 MG PO TABS: 1.0000 | ORAL_TABLET | ORAL | Status: DC | PRN

## 2015-08-30 MED ORDER — MORPHINE SULFATE (PF) 4 MG/ML IV SOLN
4.0000 mg | Freq: Once | INTRAVENOUS | Status: AC
  Administered 2015-08-30: 4 mg via INTRAVENOUS
  Filled 2015-08-30: qty 1

## 2015-08-30 MED ORDER — HYDROMORPHONE HCL 1 MG/ML IJ SOLN
0.5000 mg | Freq: Once | INTRAMUSCULAR | Status: AC
  Administered 2015-08-30: 0.5 mg via INTRAVENOUS
  Filled 2015-08-30: qty 1

## 2015-08-30 MED ORDER — COLCHICINE 0.6 MG PO TABS: 0.6000 mg | ORAL_TABLET | Freq: Every day | ORAL | Status: DC

## 2015-08-30 MED ORDER — ONDANSETRON HCL 4 MG/2ML IJ SOLN
4.0000 mg | Freq: Once | INTRAMUSCULAR | Status: AC
  Administered 2015-08-30: 4 mg via INTRAVENOUS
  Filled 2015-08-30: qty 2

## 2015-08-30 MED ORDER — PREDNISONE 10 MG PO TABS: 20.0000 mg | ORAL_TABLET | Freq: Every day | ORAL | Status: DC

## 2015-08-30 NOTE — ED Provider Notes (Signed)
CSN: [id number]     Arrival date & time 08/30/15  1159 History  By signing my name below, I, Evon Slackerrance Branch, attest that this documentation has been prepared under the direction and in the presence of Donnetta HutchingBrian Rushton Early, MD. Electronically Signed: Evon Slackerrance Branch, ED Scribe. 08/30/2015. 12:57 PM.    Chief Complaint  Patient presents with  . Hand Pain   Patient is a 30 y.o. male presenting with hand pain. The history is provided by the patient. No language interpreter was used.  Hand Pain   HPI Comments: [name] is a 30 y.o. male who presents to the Emergency Department complaining of right hand pain onset 3 days prior. Pt states that the pain is located across all of his fingers and his wrist. Pt states that he has associated swelling. Pt states that he thinks he is having a gout flare. Pt states that he has been taking tylenol and alprenolol with no relief. Pt denies trauma or injury to the hand. Pt denies numbness or other related symptoms. No fever, sweats, or chills.   Past Medical History  Diagnosis Date  . Gout   . Cellulitis   . Obesity   . Erosive esophagitis 09/01/2012    NSAID induced.  . Gastric ulcer with hemorrhage 09/02/2012    s/p bleeding control tx. Per Dr. Jena Gaussourk  . Acute blood loss anemia 09/02/2012    S/p 1 unit rbcs  . UTI (lower urinary tract infection)   . Gall stones    Past Surgical History  Procedure Laterality Date  . Tonsillectomy    . Esophagogastroduodenoscopy Left 09/01/2012    KGM:[id number]:Distal esophageal erosions consistent with erosive reflux esophagitis/Pre-pyloric benign-appearing gastric ulcer with bleeding stigmata status post bleeding control therapy as described above  . Esophagogastroduodenoscopy N/A 12/13/2012    [id number]:[id number]:Hiatal hernia. PReviously noted gastric ulcer completely healed.   . Colonoscopy with propofol N/A 07/30/2014    RMR: Colonoscopy anal canal/internal hemorrhoids, otherwise normal ileocolonoscoopy.   . Esophagogastroduodenoscopy (egd)  with propofol N/A 07/30/2014    Procedure: ESOPHAGOGASTRODUODENOSCOPY (EGD) WITH PROPOFOL;  Surgeon: Corbin Adeobert M Rourk, MD;  Location: [facility];  Service: Endoscopy;  Laterality: N/A;   Family History  Problem Relation Age of Onset  . Heart failure Mother   . Colon cancer Neg Hx    Social History  Substance Use Topics  . Smoking status: Heavy Tobacco Smoker -- 1.00 packs/day for 7 years    Types: Cigarettes  . Smokeless tobacco: Current User    Types: Snuff     Comment: daily in the evenings\  . Alcohol Use: No    Review of Systems  Musculoskeletal: Positive for joint swelling and arthralgias.  Neurological: Negative for numbness.  All other systems reviewed and are negative.     Allergies  Asa; Clindamycin/lincomycin; Nsaids; Penicillins; and Sulfa antibiotics  Home Medications   Prior to Admission medications   Medication Sig Start Date End Date Taking? Authorizing Provider  acetaminophen (TYLENOL) 325 MG tablet Take 2-3 tablets (650-975 mg total) by mouth daily as needed for mild pain or moderate pain. 05/07/15  Yes Marily MemosJason Mesner, MD  allopurinol (ZYLOPRIM) 300 MG tablet Take 300 mg by mouth daily.   Yes Historical Provider, MD  calcium carbonate (TUMS - DOSED IN MG ELEMENTAL CALCIUM) 500 MG chewable tablet Chew 1 tablet by mouth 2 (two) times daily.   Yes Historical Provider, MD  gabapentin (NEURONTIN) 300 MG capsule Take 600 mg by mouth 3 (three) times daily as needed (pain).  Yes Historical Provider, MD  colchicine 0.6 MG tablet Take 1 tablet (0.6 mg total) by mouth daily. 2 tablets initially, then 1 tablet 1 hour later. Can repeat this regimen in 3 days. 08/30/15   Donnetta Hutching, MD  HYDROcodone-acetaminophen (NORCO) 7.5-325 MG tablet Take 1 tablet by mouth every 6 (six) hours as needed for moderate pain. Patient not taking: Reported on 08/30/2015 08/04/15   Tammy Triplett, PA-C  oxyCODONE-acetaminophen (PERCOCET) 5-325 MG tablet Take 1-2 tablets by mouth every 4 (four) hours as  needed. 08/30/15   Donnetta Hutching, MD  predniSONE (DELTASONE) 10 MG tablet Take 2 tablets (20 mg total) by mouth daily. 08/30/15   Donnetta Hutching, MD   BP 136/83 mmHg  Pulse 90  Temp(Src) 98.5 F (36.9 C) (Oral)  Resp 16  Ht  (1.702 m)  Wt 350 lb (158.759 kg)  BMI 54.80 kg/m2  SpO2 94%    Physical Exam  Constitutional: He is oriented to person, place, and time. He appears well-developed and well-nourished.  HENT:  Head: Normocephalic and atraumatic.  Eyes: Conjunctivae and EOM are normal. Pupils are equal, round, and reactive to light.  Neck: Normal range of motion. Neck supple.  Cardiovascular: Normal rate.   Musculoskeletal: Normal range of motion.  tenderness in wrist in all MCP joints of right hand. Pain with ROM   Neurological: He is alert and oriented to person, place, and time.  Skin: Skin is warm and dry.  Psychiatric: He has a normal mood and affect. His behavior is normal.  Nursing note and vitals reviewed.   ED Course  Procedures (including critical care time) DIAGNOSTIC STUDIES: Oxygen Saturation is 98% on RA, normal by my interpretation.    COORDINATION OF CARE: 12:56 PM-Discussed treatment plan which includes pain medication and steroids with pt at bedside and pt agreed to plan.      Labs Review Labs Reviewed - No data to display  Imaging Review No results found.    EKG Interpretation None      MDM   Final diagnoses:  Right wrist pain   History and physical consistent with gout. Pain management and IV steroids. Discharge medications colchicine, prednisone, Percocet.   I personally performed the services described in this documentation, which was scribed in my presence. The recorded information has been reviewed and is accurate.       Donnetta Hutching, MD 08/30/15 [phone]

## 2015-08-30 NOTE — ED Notes (Signed)
MD at bedside. 

## 2015-08-30 NOTE — ED Notes (Signed)
Pt reports gout started in right hand Friday night with last 2 fingers, now swelling and pain and moved across hand and into wrist.

## 2015-08-30 NOTE — Discharge Instructions (Signed)
Prescription for pain medicine, prednisone, colchicine. Elevate hand. Return if worse.

## 2015-09-25 IMAGING — CR DG HAND COMPLETE 3+V*R*
4 series · 4 of 4 positions shown · non-contrast
Comparison: 02/17/2007

CLINICAL DATA: Right hand pain, no known injury, initial encounter

EXAM:
RIGHT HAND - COMPLETE 3+ VIEW

[view not recorded (1 of 4)]
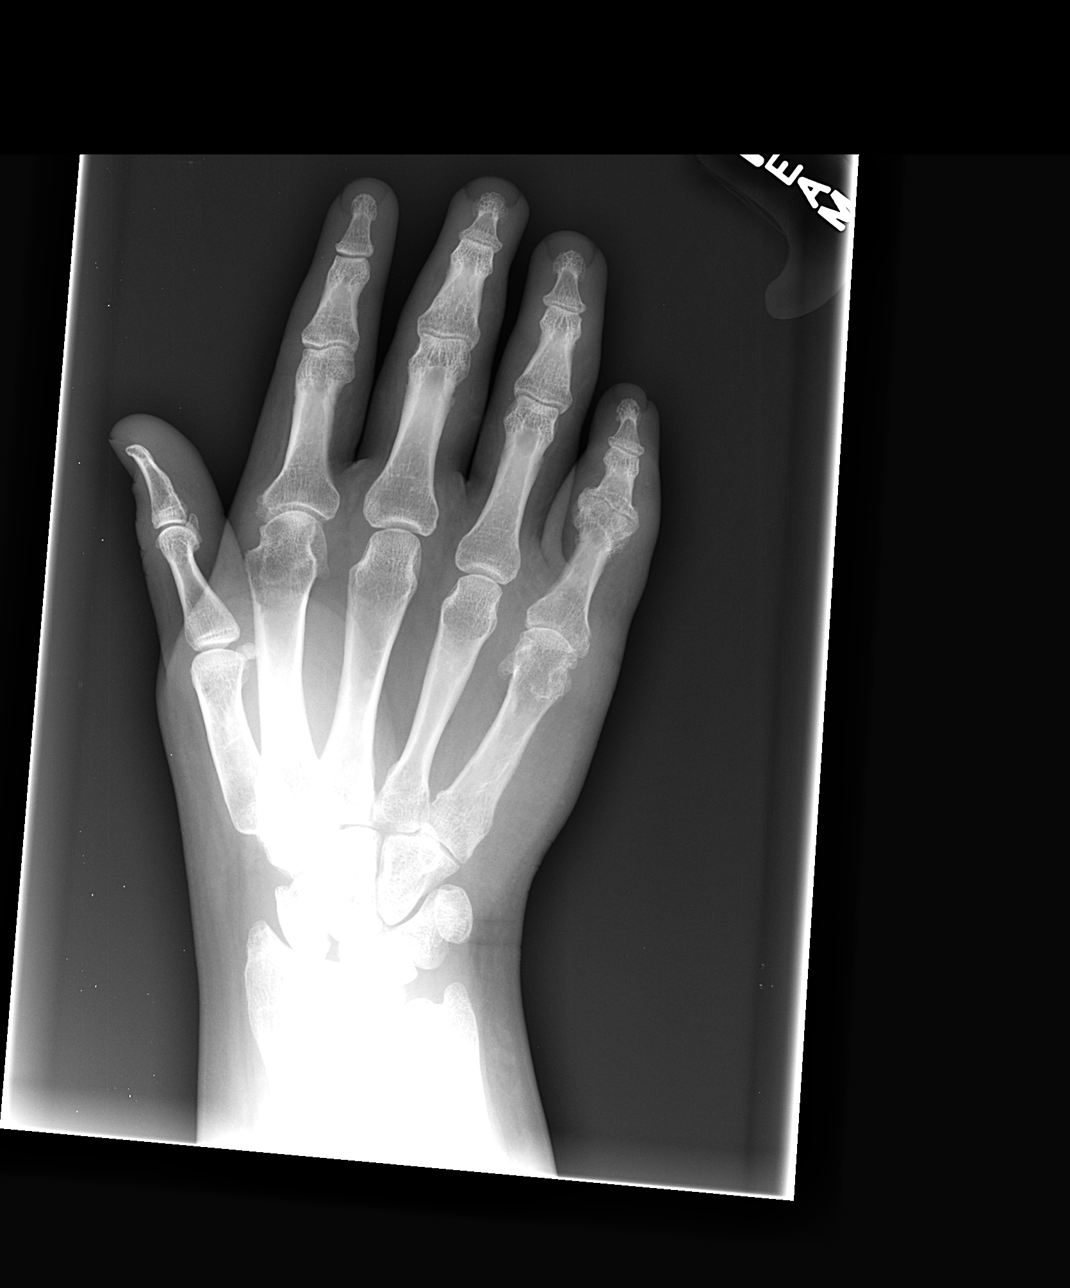

[view not recorded (2 of 4)]
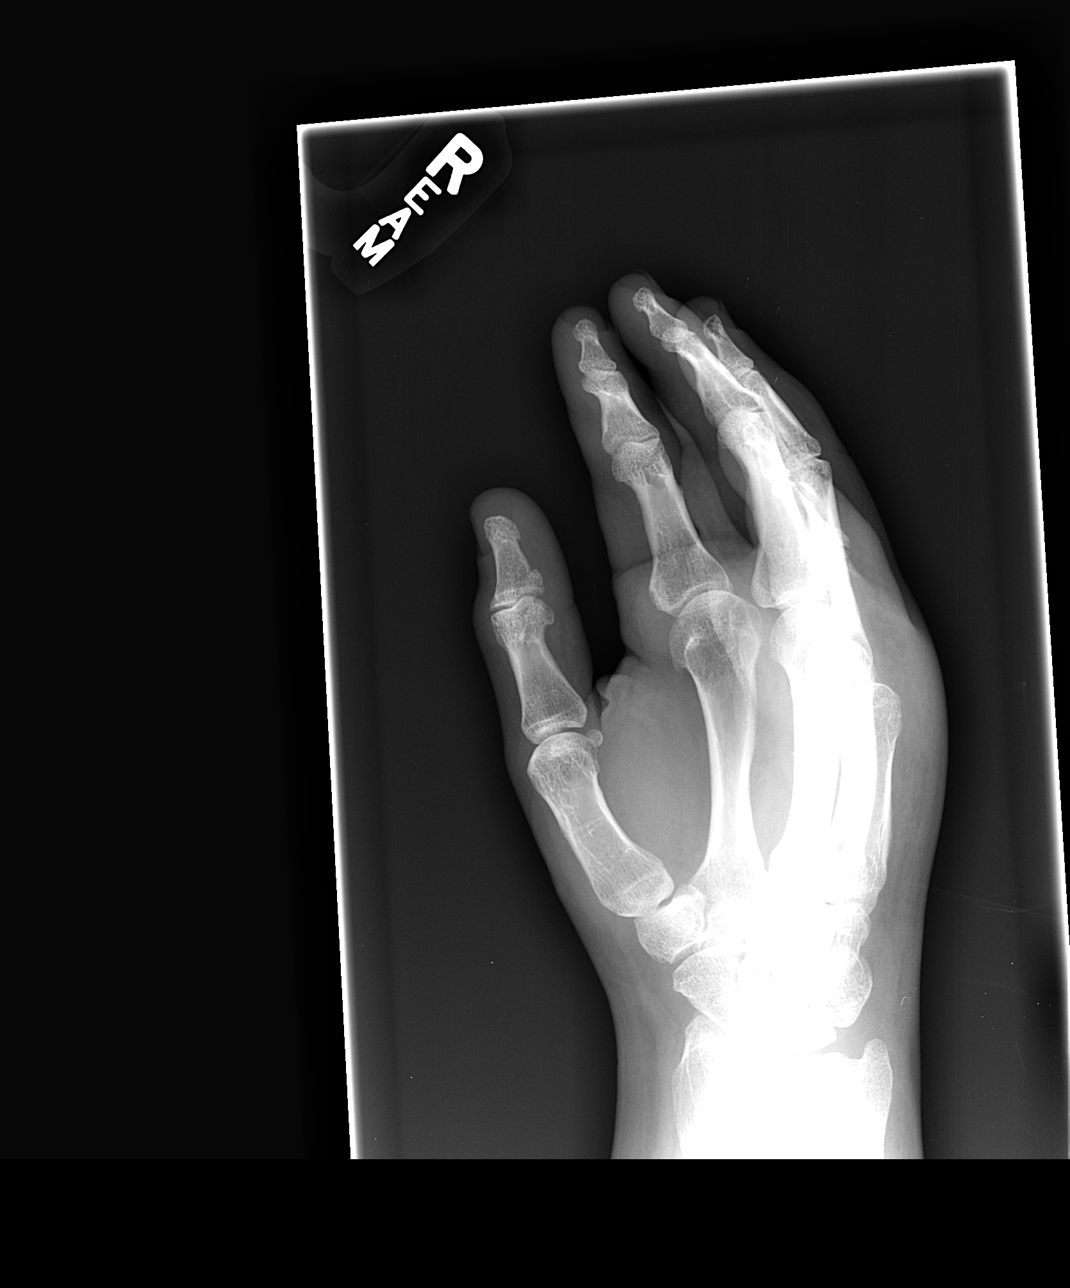

[view not recorded (3 of 4)]
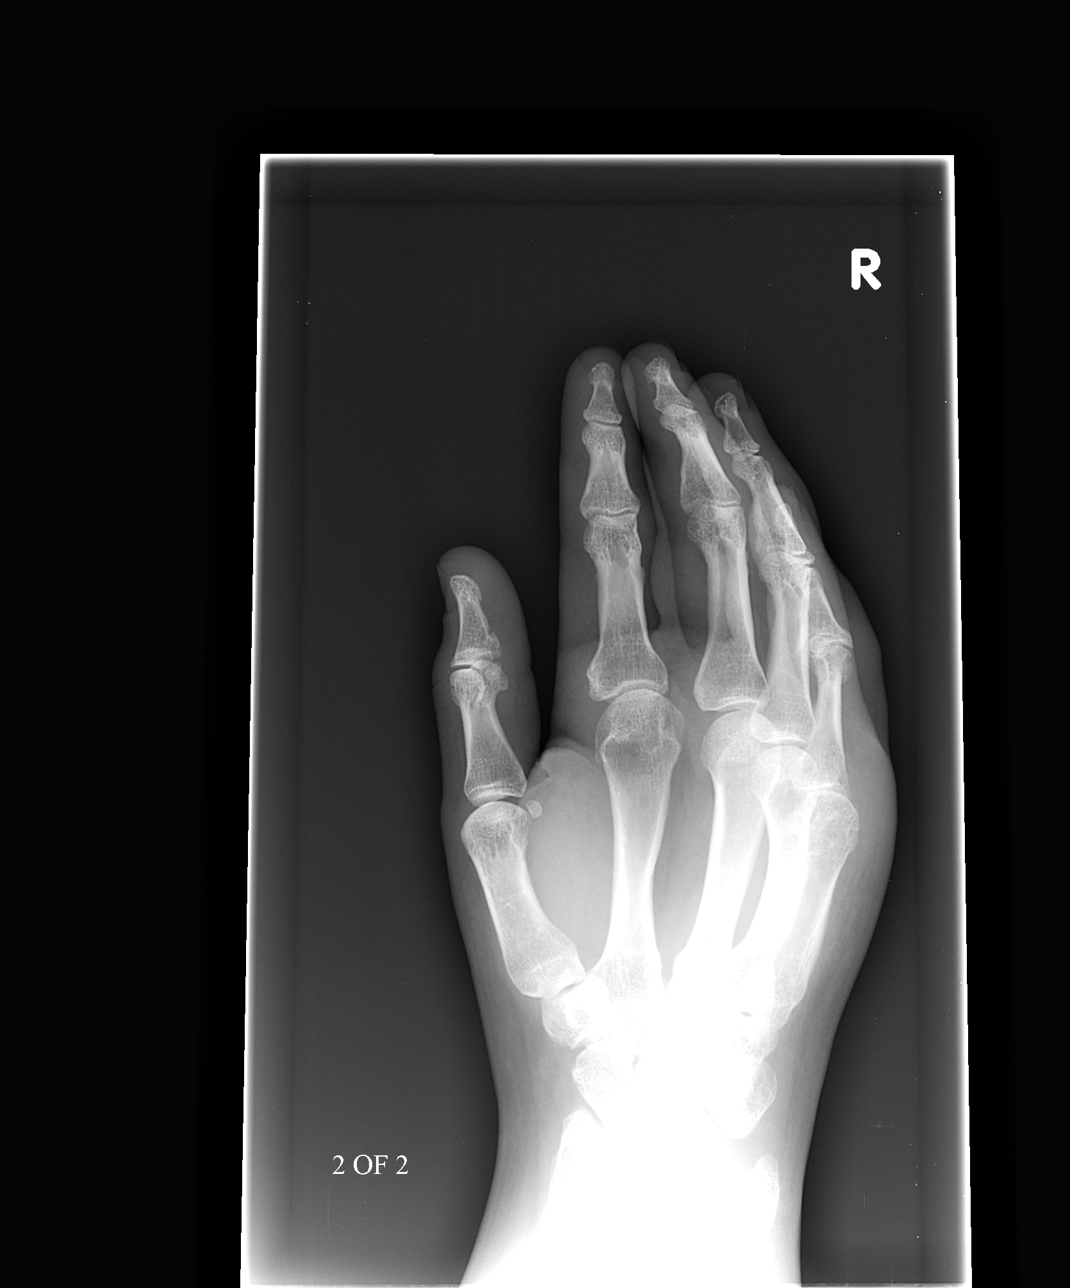

[view not recorded (4 of 4)]
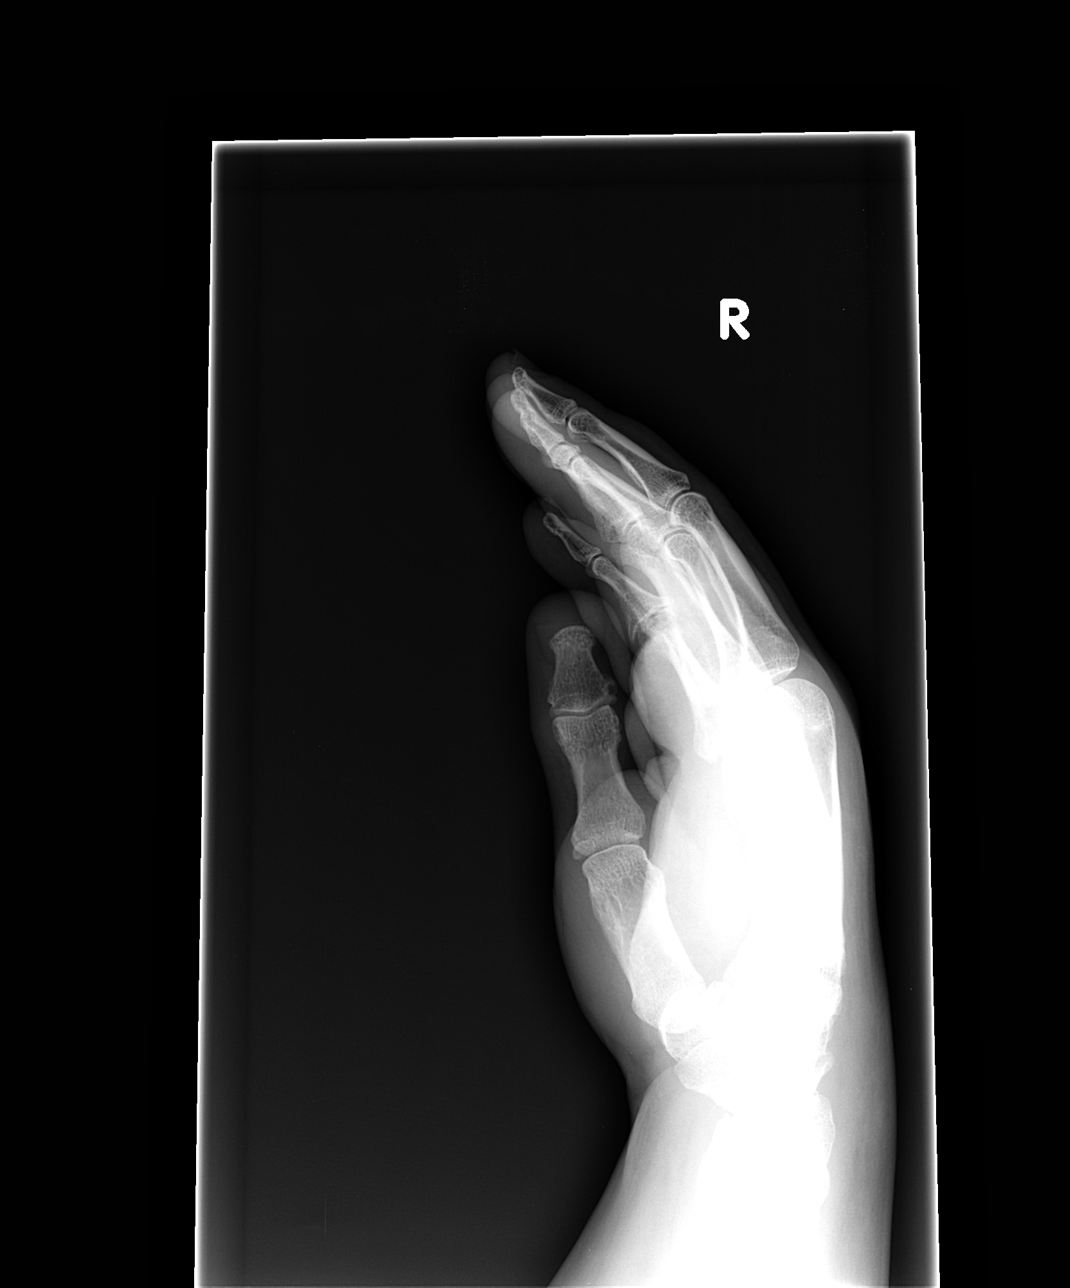

[4 of 4 positions shown; findings below may reference images not displayed]

FINDINGS: Significant degenerative changes are noted in the fifth proximal
interphalangeal joint which have increased in the interval from the
prior exam. No acute fracture or dislocation is noted. Generalized
soft tissue swelling is noted as well.
IMPRESSION: Degenerative change in the fifth digit as described. No acute bony
abnormality is seen. Generalized soft tissue swelling is noted.

## 2015-10-11 ENCOUNTER — Encounter (HOSPITAL_COMMUNITY): Payer: Self-pay | Admitting: Emergency Medicine

## 2015-10-11 ENCOUNTER — Emergency Department (HOSPITAL_COMMUNITY): Payer: Medicaid Other

## 2015-10-11 ENCOUNTER — Emergency Department (HOSPITAL_COMMUNITY)
Admission: EM | Admit: 2015-10-11 | Discharge: 2015-10-12 | Disposition: A | Discharge status: Home / Self Care | Payer: Medicaid Other | Attending: Emergency Medicine | Admitting: Emergency Medicine

## 2015-10-11 DIAGNOSIS — R110 Nausea: Secondary | ICD-10-CM | POA: Diagnosis not present

## 2015-10-11 DIAGNOSIS — W1789XA Other fall from one level to another, initial encounter: Secondary | ICD-10-CM | POA: Insufficient documentation

## 2015-10-11 DIAGNOSIS — M545 Low back pain, unspecified: Secondary | ICD-10-CM

## 2015-10-11 DIAGNOSIS — Y9339 Activity, other involving climbing, rappelling and jumping off: Secondary | ICD-10-CM | POA: Diagnosis not present

## 2015-10-11 DIAGNOSIS — Y999 Unspecified external cause status: Secondary | ICD-10-CM | POA: Insufficient documentation

## 2015-10-11 DIAGNOSIS — Z79899 Other long term (current) drug therapy: Secondary | ICD-10-CM | POA: Insufficient documentation

## 2015-10-11 DIAGNOSIS — R911 Solitary pulmonary nodule: Secondary | ICD-10-CM | POA: Insufficient documentation

## 2015-10-11 DIAGNOSIS — S301XXA Contusion of abdominal wall, initial encounter: Secondary | ICD-10-CM | POA: Diagnosis not present

## 2015-10-11 DIAGNOSIS — R918 Other nonspecific abnormal finding of lung field: Secondary | ICD-10-CM

## 2015-10-11 DIAGNOSIS — S3991XA Unspecified injury of abdomen, initial encounter: Secondary | ICD-10-CM | POA: Diagnosis present

## 2015-10-11 DIAGNOSIS — F17210 Nicotine dependence, cigarettes, uncomplicated: Secondary | ICD-10-CM | POA: Diagnosis not present

## 2015-10-11 DIAGNOSIS — R197 Diarrhea, unspecified: Secondary | ICD-10-CM | POA: Insufficient documentation

## 2015-10-11 DIAGNOSIS — Y929 Unspecified place or not applicable: Secondary | ICD-10-CM | POA: Diagnosis not present

## 2015-10-11 LAB — CBC WITH DIFFERENTIAL/PLATELET
BASOS ABS: 0 10*3/uL (ref 0.0–0.1)
BASOS PCT: 0 %
EOS ABS: 0.4 10*3/uL (ref 0.0–0.7)
Eosinophils Relative: 4 %
HEMATOCRIT: 45.5 % (ref 39.0–52.0)
HEMOGLOBIN: 15.4 g/dL (ref 13.0–17.0)
Lymphocytes Relative: 25 %
Lymphs Abs: 2.8 10*3/uL (ref 0.7–4.0)
MCH: 29.8 pg (ref 26.0–34.0)
MCHC: 33.8 g/dL (ref 30.0–36.0)
MCV: 88 fL (ref 78.0–100.0)
Monocytes Absolute: 1.3 10*3/uL — ABNORMAL HIGH (ref 0.1–1.0)
Monocytes Relative: 11 %
NEUTROS ABS: 6.7 10*3/uL (ref 1.7–7.7)
NEUTROS PCT: 60 %
Platelets: 215 10*3/uL (ref 150–400)
RBC: 5.17 MIL/uL (ref 4.22–5.81)
RDW: 13.3 % (ref 11.5–15.5)
WBC: 11.2 10*3/uL — AB (ref 4.0–10.5)

## 2015-10-11 LAB — COMPREHENSIVE METABOLIC PANEL
ALK PHOS: 54 U/L (ref 38–126)
ALT: 12 U/L — AB (ref 17–63)
ANION GAP: 7 (ref 5–15)
AST: 12 U/L — ABNORMAL LOW (ref 15–41)
Albumin: 3.7 g/dL (ref 3.5–5.0)
BUN: 7 mg/dL (ref 6–20)
CALCIUM: 8.6 mg/dL — AB (ref 8.9–10.3)
CO2: 25 mmol/L (ref 22–32)
CREATININE: 0.6 mg/dL — AB (ref 0.61–1.24)
Chloride: 106 mmol/L (ref 101–111)
Glucose, Bld: 98 mg/dL (ref 65–99)
Potassium: 3.3 mmol/L — ABNORMAL LOW (ref 3.5–5.1)
SODIUM: 138 mmol/L (ref 135–145)
TOTAL PROTEIN: 6.8 g/dL (ref 6.5–8.1)
Total Bilirubin: 0.6 mg/dL (ref 0.3–1.2)

## 2015-10-11 LAB — URINALYSIS, ROUTINE W REFLEX MICROSCOPIC
BILIRUBIN URINE: NEGATIVE
Glucose, UA: NEGATIVE mg/dL
Hgb urine dipstick: NEGATIVE
Ketones, ur: NEGATIVE mg/dL
LEUKOCYTES UA: NEGATIVE
NITRITE: NEGATIVE
Protein, ur: NEGATIVE mg/dL
SPECIFIC GRAVITY, URINE: 1.02 (ref 1.005–1.030)
pH: 6 (ref 5.0–8.0)

## 2015-10-11 LAB — LIPASE, BLOOD: LIPASE: 15 U/L (ref 11–51)

## 2015-10-11 MED ORDER — OXYCODONE-ACETAMINOPHEN 5-325 MG PO TABS: 1.0000 | ORAL_TABLET | ORAL | Status: DC | PRN

## 2015-10-11 MED ORDER — FENTANYL CITRATE (PF) 100 MCG/2ML IJ SOLN
50.0000 ug | Freq: Once | INTRAMUSCULAR | Status: AC
  Administered 2015-10-11: 50 ug via INTRAVENOUS
  Filled 2015-10-11: qty 2

## 2015-10-11 MED ORDER — CYCLOBENZAPRINE HCL 10 MG PO TABS: 10.0000 mg | ORAL_TABLET | Freq: Three times a day (TID) | ORAL | Status: DC | PRN

## 2015-10-11 MED ORDER — IOPAMIDOL (ISOVUE-300) INJECTION 61%
100.0000 mL | Freq: Once | INTRAVENOUS | Status: AC | PRN
  Administered 2015-10-11: 100 mL via INTRAVENOUS

## 2015-10-11 MED ORDER — ONDANSETRON HCL 4 MG/2ML IJ SOLN
4.0000 mg | Freq: Once | INTRAMUSCULAR | Status: AC
  Administered 2015-10-11: 4 mg via INTRAVENOUS
  Filled 2015-10-11: qty 2

## 2015-10-11 NOTE — ED Notes (Signed)
Pt fell on Friday, bruise to the lower abdomen. Complaining of abdominal pain, back pain. Had some nausea & diarrhea.

## 2015-10-11 NOTE — ED Provider Notes (Signed)
CSN: [id number]     Arrival date & time 10/11/15  1904 History   First MD Initiated Contact with Patient 10/11/15 1941     Chief Complaint  Patient presents with  . Abdominal Pain     (Consider location/radiation/quality/duration/timing/severity/associated sxs/prior Treatment) HPI   [name] is a 30 y.o. male who presents to the Emergency Department complaining of pain and bruising to his right lower abdomen and lower mid back.  He states that he was standing on a upper surface and as he jumped down, he slipped landing against the blunt edge of the tailgate of a pick up truck.  Incident occurred Friday afternoon.  He noticed slight pain, but noticed bruising to his abdomen and back pain yesterday. He also has nausea, and few episodes of diarrhea.  Movement makes the pain worse.  He had two prednisone tablets let from a previous prescription that he took yesterday.  He denies chest pain, shortness of breath, head injury, neck pain, hematuria, fever or vomiting.    Past Medical History  Diagnosis Date  . Gout   . Cellulitis   . Obesity   . Erosive esophagitis 09/01/2012    NSAID induced.  . Gastric ulcer with hemorrhage 09/02/2012    s/p bleeding control tx. Per Dr. Jena Gauss  . Acute blood loss anemia 09/02/2012    S/p 1 unit rbcs  . UTI (lower urinary tract infection)   . Gall stones    Past Surgical History  Procedure Laterality Date  . Tonsillectomy    . Esophagogastroduodenoscopy Left 09/01/2012    JYN:WGNFAO esophageal erosions consistent with erosive reflux esophagitis/Pre-pyloric benign-appearing gastric ulcer with bleeding stigmata status post bleeding control therapy as described above  . Esophagogastroduodenoscopy N/A 12/13/2012    ZHY:QMVHQI hernia. PReviously noted gastric ulcer completely healed.   . Colonoscopy with propofol N/A 07/30/2014    RMR: Colonoscopy anal canal/internal hemorrhoids, otherwise normal ileocolonoscoopy.   . Esophagogastroduodenoscopy (egd)  with propofol N/A 07/30/2014    Procedure: ESOPHAGOGASTRODUODENOSCOPY (EGD) WITH PROPOFOL;  Surgeon: Corbin Ade, MD;  Location: [facility];  Service: Endoscopy;  Laterality: N/A;   Family History  Problem Relation Age of Onset  . Heart failure Mother   . Colon cancer Neg Hx    Social History  Substance Use Topics  . Smoking status: Heavy Tobacco Smoker -- 1.00 packs/day for 7 years    Types: Cigarettes  . Smokeless tobacco: Current User    Types: Snuff     Comment: daily in the evenings\  . Alcohol Use: No    Review of Systems  Constitutional: Negative for fever, chills and appetite change.  Respiratory: Negative for chest tightness and shortness of breath.   Cardiovascular: Negative for chest pain.  Gastrointestinal: Positive for nausea, abdominal pain and diarrhea. Negative for vomiting, blood in stool and abdominal distention.       Bruising to right abdomen  Genitourinary: Negative for dysuria, hematuria, flank pain, decreased urine volume and difficulty urinating.  Musculoskeletal: Positive for back pain.  Skin: Negative for color change and rash.  Neurological: Negative for dizziness, syncope, weakness and numbness.  Hematological: Negative for adenopathy.  All other systems reviewed and are negative.     Allergies  Asa; Clindamycin/lincomycin; Nsaids; Penicillins; and Sulfa antibiotics  Home Medications   Prior to Admission medications   Medication Sig Start Date End Date Taking? Authorizing Provider  acetaminophen (TYLENOL) 325 MG tablet Take 2-3 tablets (650-975 mg total) by mouth daily as needed for mild pain or  moderate pain. 05/07/15  Yes Marily MemosJason Mesner, MD  allopurinol (ZYLOPRIM) 300 MG tablet Take 300 mg by mouth daily.   Yes Historical Provider, MD  calcium carbonate (TUMS - DOSED IN MG ELEMENTAL CALCIUM) 500 MG chewable tablet Chew 1 tablet by mouth 2 (two) times daily.   Yes Historical Provider, MD  gabapentin (NEURONTIN) 300 MG capsule Take 300-600 mg by  mouth 3 (three) times daily as needed (pain).    Yes Historical Provider, MD  colchicine 0.6 MG tablet Take 1 tablet (0.6 mg total) by mouth daily. 2 tablets initially, then 1 tablet 1 hour later. Can repeat this regimen in 3 days. Patient not taking: Reported on 10/11/2015 08/30/15   Donnetta HutchingBrian Cook, MD  HYDROcodone-acetaminophen [facility](NORCO) 7.5-325 MG tablet Take 1 tablet by mouth every 6 (six) hours as needed for moderate pain. Patient not taking: Reported on 08/30/2015 08/04/15   Yazleen Molock, PA-C  oxyCODONE-acetaminophen (PERCOCET) 5-325 MG tablet Take 1-2 tablets by mouth every 4 (four) hours as needed. Patient not taking: Reported on 10/11/2015 08/30/15   Donnetta HutchingBrian Cook, MD  predniSONE (DELTASONE) 10 MG tablet Take 2 tablets (20 mg total) by mouth daily. Patient not taking: Reported on 10/11/2015 08/30/15   Donnetta HutchingBrian Cook, MD   BP 134/74 mmHg  Pulse 87  Temp(Src) 98.1 F (36.7 C) (Oral)  Resp 20  Ht 5\' 7"  (1.702 m)  Wt 163.295 kg  BMI 56.37 kg/m2  SpO2 99% Physical Exam  Constitutional: He is oriented to person, place, and time. He appears well-developed and well-nourished. No distress.  HENT:  Head: Normocephalic and atraumatic.  Mouth/Throat: Oropharynx is clear and moist.  Eyes: EOM are normal. Pupils are equal, round, and reactive to light.  Neck: Normal range of motion. Neck supple.  Cardiovascular: Normal rate, regular rhythm, normal heart sounds and intact distal pulses.   No murmur heard. Pulmonary/Chest: Effort normal and breath sounds normal. No respiratory distress. He exhibits tenderness.  ttp of the right lower ribs.  No ecchymosis or crepitus.    Abdominal: Soft. Bowel sounds are normal. He exhibits no distension and no mass. There is tenderness. There is no rebound and no guarding.  Focal tenderness of the right lower abdomen with large area of ecchymosis.  No guarding.    Musculoskeletal: Normal range of motion. He exhibits tenderness. He exhibits no edema.  ttp of the lower lumbar  spine and right paraspinal muscles.  No step offs.  No gross sensory or motor deficits.  Neurological: He is alert and oriented to person, place, and time. He exhibits normal muscle tone. Coordination normal.  Skin: Skin is warm and dry.  Psychiatric: He has a normal mood and affect.  Nursing note and vitals reviewed.   ED Course  Procedures (including critical care time) Labs Review Labs Reviewed  COMPREHENSIVE METABOLIC PANEL - Abnormal; Notable for the following:    Potassium 3.3 (*)    Creatinine, Ser 0.60 (*)    Calcium 8.6 (*)    AST 12 (*)    ALT 12 (*)    All other components within normal limits  CBC WITH DIFFERENTIAL/PLATELET - Abnormal; Notable for the following:    WBC 11.2 (*)    Monocytes Absolute 1.3 (*)    All other components within normal limits  URINALYSIS, ROUTINE W REFLEX MICROSCOPIC (NOT [facility])  LIPASE, BLOOD    Imaging Review Ct Chest W Contrast  10/11/2015  CLINICAL DATA:  Pain after fall EXAM: CT CHEST, ABDOMEN, AND PELVIS WITH CONTRAST TECHNIQUE: Multidetector  CT imaging of the chest, abdomen and pelvis was performed following the standard protocol during bolus administration of intravenous contrast. CONTRAST:  100mL ISOVUE-300 IOPAMIDOL (ISOVUE-300) INJECTION 61% COMPARISON:  CT of the abdomen and pelvis July 29, 2014 FINDINGS: CT CHEST There is a 4.5 mm nodule in the lateral left lung on series 6, image 77. A 4 mm nodule is seen on the right on image 69. No other suspicious nodules. No masses or infiltrates. The central airways are within normal limits. No pneumothorax. The thoracic aorta, heart, pulmonary arteries, and pleura are normal. No pneumothorax. No effusions. No adenopathy. CT ABDOMEN AND PELVIS No free air or free fluid. Fat stranding over the anterior pelvis on series 2, image 108 with overlying skin thickening is consistent with history of trauma. Cholelithiasis is seen. The liver, spleen, right adrenal gland, pancreas, and kidneys are normal.  The low-attenuation nodule in the left adrenal gland remains, previously noted to represent an adenoma. The abdominal aorta is normal in caliber. No adenopathy. The stomach and small bowel are normal. The colon demonstrates scattered diverticulosis without diverticulitis. The appendix is well seen with no appendicitis. The pelvis demonstrates prominent inguinal nodes, similar in the interval, likely reactive. A single prominent node in the left external iliac chain on series 2, image 133 is stable as well. No new adenopathy in the pelvis. The bladder is normal. The prostate and seminal vesicles are normal. Delayed images demonstrate poor excretion from the kidneys limiting evaluation of renal collecting systems. No acute fractures are identified. IMPRESSION: 1. 4.5 mm nodule in the left lung base. See below for follow-up recommendations. There is also a 4 mm nodule on the right as well. 2. Bruising over the right anterior pelvic wall. 3. Cholelithiasis. 4. Benign left adrenal gland nodule. 5. Poor excretion from the kidneys on delayed images. Recommend correlation with labs. No follow-up needed if patient is low-risk. Non-contrast chest CT can be considered in 12 months if patient is high-risk. This recommendation follows the consensus statement: Guidelines for Management of Incidental Pulmonary Nodules Detected on CT Images:From the Fleischner Society 2017; published online before print (10.1148/radiol.[id number]). Electronically Signed   By: Gerome Samavid  Williams III M.D   On: 10/11/2015 23:21   Ct Abdomen Pelvis W Contrast  10/11/2015  CLINICAL DATA:  Pain after fall EXAM: CT CHEST, ABDOMEN, AND PELVIS WITH CONTRAST TECHNIQUE: Multidetector CT imaging of the chest, abdomen and pelvis was performed following the standard protocol during bolus administration of intravenous contrast. CONTRAST:  100mL ISOVUE-300 IOPAMIDOL (ISOVUE-300) INJECTION 61% COMPARISON:  CT of the abdomen and pelvis July 29, 2014 FINDINGS: CT  CHEST There is a 4.5 mm nodule in the lateral left lung on series 6, image 77. A 4 mm nodule is seen on the right on image 69. No other suspicious nodules. No masses or infiltrates. The central airways are within normal limits. No pneumothorax. The thoracic aorta, heart, pulmonary arteries, and pleura are normal. No pneumothorax. No effusions. No adenopathy. CT ABDOMEN AND PELVIS No free air or free fluid. Fat stranding over the anterior pelvis on series 2, image 108 with overlying skin thickening is consistent with history of trauma. Cholelithiasis is seen. The liver, spleen, right adrenal gland, pancreas, and kidneys are normal. The low-attenuation nodule in the left adrenal gland remains, previously noted to represent an adenoma. The abdominal aorta is normal in caliber. No adenopathy. The stomach and small bowel are normal. The colon demonstrates scattered diverticulosis without diverticulitis. The appendix is well seen with no appendicitis.  The pelvis demonstrates prominent inguinal nodes, similar in the interval, likely reactive. A single prominent node in the left external iliac chain on series 2, image 133 is stable as well. No new adenopathy in the pelvis. The bladder is normal. The prostate and seminal vesicles are normal. Delayed images demonstrate poor excretion from the kidneys limiting evaluation of renal collecting systems. No acute fractures are identified. IMPRESSION: 1. 4.5 mm nodule in the left lung base. See below for follow-up recommendations. There is also a 4 mm nodule on the right as well. 2. Bruising over the right anterior pelvic wall. 3. Cholelithiasis. 4. Benign left adrenal gland nodule. 5. Poor excretion from the kidneys on delayed images. Recommend correlation with labs. No follow-up needed if patient is low-risk. Non-contrast chest CT can be considered in 12 months if patient is high-risk. This recommendation follows the consensus statement: Guidelines for Management of Incidental  Pulmonary Nodules Detected on CT Images:From the Fleischner Society 2017; published online before print (10.1148/radiol.[id number]). Electronically Signed   By: Gerome Sam III M.D   On: 10/11/2015 23:21   I have personally reviewed and evaluated these images and lab results as part of my medical decision-making.   EKG Interpretation None      MDM   Final diagnoses:  Contusion, abdominal wall, initial encounter  Pulmonary nodules  Midline low back pain without sciatica    Pt with large bruise to the right abdominal wall, CT abd/pelvis neg for internal injuries.  Well appearing, remains hemodynamically stable.  CT scan of chest shows small lung nodules bilaterally that are likely incidental finding.  I have discussed this with the patient and the importance of recheck in one year as recommended by current radiology guidelines.  Pt verbalized understanding and agrees to plan.  He is feeling better after medications and appears stable for d/c.  Return precautions also discussed.       Pauline Aus, PA-C 10/11/15 2358  Vanetta Mulders, MD 10/13/15 [phone]

## 2015-10-11 NOTE — ED Notes (Signed)
Pt fell on tailgate of truck on Friday and hit his lower abdominal area. Pt has large bruise to abdomen. Pt also c/o headache and some nausea.

## 2015-10-11 NOTE — Discharge Instructions (Signed)

## 2015-10-12 NOTE — ED Notes (Signed)
Pt alert & oriented x4, stable gait. Patient given discharge instructions, paperwork & prescription(s). Patient informed not to drive, operate any equipment & handel any important documents 4 hours after taking pain medication. Patient  instructed to stop at the registration desk to finish any additional paperwork. Patient  verbalized understanding. Pt left department w/ no further questions. 

## 2015-10-18 MED FILL — Hydrocodone-Acetaminophen Tab 5-325 MG: ORAL | Qty: 6 | Status: AC

## 2015-11-02 ENCOUNTER — Emergency Department (HOSPITAL_COMMUNITY)
Admission: EM | Admit: 2015-11-02 | Discharge: 2015-11-02 | Disposition: A | Discharge status: Home / Self Care | Payer: Medicaid Other | Attending: Emergency Medicine | Admitting: Emergency Medicine

## 2015-11-02 ENCOUNTER — Encounter (HOSPITAL_COMMUNITY): Payer: Self-pay | Admitting: Emergency Medicine

## 2015-11-02 DIAGNOSIS — M25562 Pain in left knee: Secondary | ICD-10-CM | POA: Diagnosis present

## 2015-11-02 DIAGNOSIS — F17210 Nicotine dependence, cigarettes, uncomplicated: Secondary | ICD-10-CM | POA: Insufficient documentation

## 2015-11-02 DIAGNOSIS — M109 Gout, unspecified: Secondary | ICD-10-CM

## 2015-11-02 LAB — CBC
HEMATOCRIT: 44.9 % (ref 39.0–52.0)
HEMOGLOBIN: 15.6 g/dL (ref 13.0–17.0)
MCH: 30.3 pg (ref 26.0–34.0)
MCHC: 34.7 g/dL (ref 30.0–36.0)
MCV: 87.2 fL (ref 78.0–100.0)
Platelets: 213 10*3/uL (ref 150–400)
RBC: 5.15 MIL/uL (ref 4.22–5.81)
RDW: 13.2 % (ref 11.5–15.5)
WBC: 12.7 10*3/uL — ABNORMAL HIGH (ref 4.0–10.5)

## 2015-11-02 LAB — BASIC METABOLIC PANEL
Anion gap: 7 (ref 5–15)
BUN: 8 mg/dL (ref 6–20)
CHLORIDE: 102 mmol/L (ref 101–111)
CO2: 25 mmol/L (ref 22–32)
Calcium: 8.8 mg/dL — ABNORMAL LOW (ref 8.9–10.3)
Creatinine, Ser: 0.67 mg/dL (ref 0.61–1.24)
GFR calc Af Amer: >60 mL/min (ref >60)
GFR calc non Af Amer: >60 mL/min (ref >60)
Glucose, Bld: 99 mg/dL (ref 65–99)
POTASSIUM: 3.5 mmol/L (ref 3.5–5.1)
SODIUM: 134 mmol/L — AB (ref 135–145)

## 2015-11-02 LAB — CBG MONITORING, ED: GLUCOSE-CAPILLARY: 96 mg/dL (ref 65–99)

## 2015-11-02 MED ORDER — ACETAMINOPHEN 500 MG PO TABS
1000.0000 mg | ORAL_TABLET | Freq: Once | ORAL | Status: AC
  Administered 2015-11-02: 1000 mg via ORAL
  Filled 2015-11-02: qty 2

## 2015-11-02 MED ORDER — KETOROLAC TROMETHAMINE 30 MG/ML IJ SOLN
30.0000 mg | Freq: Once | INTRAMUSCULAR | Status: AC
  Administered 2015-11-02: 30 mg via INTRAVENOUS
  Filled 2015-11-02: qty 1

## 2015-11-02 MED ORDER — LORAZEPAM 2 MG/ML IJ SOLN
1.0000 mg | Freq: Once | INTRAMUSCULAR | Status: AC
  Administered 2015-11-02: 1 mg via INTRAVENOUS
  Filled 2015-11-02: qty 1

## 2015-11-02 MED ORDER — SODIUM CHLORIDE 0.9 % IV BOLUS (SEPSIS)
1000.0000 mL | Freq: Once | INTRAVENOUS | Status: AC
  Administered 2015-11-02: 1000 mL via INTRAVENOUS

## 2015-11-02 MED ORDER — TRAMADOL HCL 50 MG PO TABS
100.0000 mg | ORAL_TABLET | Freq: Once | ORAL | Status: AC
  Administered 2015-11-02: 100 mg via ORAL
  Filled 2015-11-02: qty 2

## 2015-11-02 MED ORDER — HYDROMORPHONE HCL 1 MG/ML IJ SOLN
1.0000 mg | Freq: Once | INTRAMUSCULAR | Status: AC
  Administered 2015-11-02: 1 mg via INTRAVENOUS
  Filled 2015-11-02: qty 1

## 2015-11-02 MED ORDER — METHYLPREDNISOLONE SODIUM SUCC 125 MG IJ SOLR
125.0000 mg | Freq: Once | INTRAMUSCULAR | Status: AC
  Administered 2015-11-02: 125 mg via INTRAVENOUS
  Filled 2015-11-02: qty 2

## 2015-11-02 MED ORDER — COLCHICINE 0.6 MG PO TABS: ORAL_TABLET | ORAL | Status: DC

## 2015-11-02 NOTE — ED Notes (Signed)
MD at bedside. 

## 2015-11-02 NOTE — ED Notes (Signed)
Pt c/o pain all over since last night.

## 2015-11-02 NOTE — ED Provider Notes (Signed)
CSN: [id number]     Arrival date & time 11/02/15  0012 History  By signing my name below, I, [name], attest that this documentation has been prepared under the direction and in the presence of Azalia Bilis, MD. Electronically Signed: Bridgette Atkins, ED Scribe. 11/02/2015. 12:57 AM.   Chief Complaint  Patient presents with  . Joint Pain   The history is provided by the patient. No language interpreter was used.   HPI Comments: [name] is a 30 y.o. male with h/o gout and obesity who presents to the Emergency Department complaining of sudden onset, constant joint pain in his left knee, bilateral shoulders and bilateral hips onset tonight. Pt notes that the pain is so severe he cannot go to the bathroom. Pt states he had similar symptoms two years ago and it presented as gout. Pt has also tried Flexeril with no relief to the pain. Pt is on Gabapentin for his neuropathy. Pt reports normal appetite but notes he hasn't eaten anything today. Pt has a PCP he regularly follows up with. Pt denies fever.He reports his pain today feels very similar to his multiple episodes of gout  Past Medical History  Diagnosis Date  . Gout   . Cellulitis   . Obesity   . Erosive esophagitis 09/01/2012    NSAID induced.  . Gastric ulcer with hemorrhage 09/02/2012    s/p bleeding control tx. Per Dr. Jena Gauss  . Acute blood loss anemia 09/02/2012    S/p 1 unit rbcs  . UTI (lower urinary tract infection)   . Gall stones    Past Surgical History  Procedure Laterality Date  . Tonsillectomy    . Esophagogastroduodenoscopy Left 09/01/2012    OZH:YQMVHQ esophageal erosions consistent with erosive reflux esophagitis/Pre-pyloric benign-appearing gastric ulcer with bleeding stigmata status post bleeding control therapy as described above  . Esophagogastroduodenoscopy N/A 12/13/2012    ION:GEXBMW hernia. PReviously noted gastric ulcer completely healed.   . Colonoscopy with propofol N/A 07/30/2014    RMR: Colonoscopy anal  canal/internal hemorrhoids, otherwise normal ileocolonoscoopy.   . Esophagogastroduodenoscopy (egd) with propofol N/A 07/30/2014    Procedure: ESOPHAGOGASTRODUODENOSCOPY (EGD) WITH PROPOFOL;  Surgeon: Corbin Ade, MD;  Location: [facility];  Service: Endoscopy;  Laterality: N/A;   Family History  Problem Relation Age of Onset  . Heart failure Mother   . Colon cancer Neg Hx    Social History  Substance Use Topics  . Smoking status: Heavy Tobacco Smoker -- 1.00 packs/day for 7 years    Types: Cigarettes  . Smokeless tobacco: Current User    Types: Snuff     Comment: daily in the evenings\  . Alcohol Use: No    Review of Systems A complete 10 system review of systems was obtained and all systems are negative except as noted in the HPI and PMH.    Allergies  Asa; Clindamycin/lincomycin; Nsaids; Penicillins; and Sulfa antibiotics  Home Medications   Prior to Admission medications   Medication Sig Start Date End Date Taking? Authorizing Provider  acetaminophen (TYLENOL) 325 MG tablet Take 2-3 tablets (650-975 mg total) by mouth daily as needed for mild pain or moderate pain. 05/07/15   Marily Memos, MD  allopurinol (ZYLOPRIM) 300 MG tablet Take 300 mg by mouth daily.    Historical Provider, MD  calcium carbonate (TUMS - DOSED IN MG ELEMENTAL CALCIUM) 500 MG chewable tablet Chew 1 tablet by mouth 2 (two) times daily.    Historical Provider, MD  cyclobenzaprine (FLEXERIL) 10 MG  tablet Take 1 tablet (10 mg total) by mouth 3 (three) times daily as needed. 10/11/15   Tammy Triplett, PA-C  gabapentin (NEURONTIN) 300 MG capsule Take 300-600 mg by mouth 3 (three) times daily as needed (pain).     Historical Provider, MD  oxyCODONE-acetaminophen (PERCOCET/ROXICET) 5-325 MG tablet Take 1 tablet by mouth every 4 (four) hours as needed. 10/11/15   Tammy Triplett, PA-C  oxyCODONE-acetaminophen (PERCOCET/ROXICET) 5-325 MG tablet Take 1 tablet by mouth every 4 (four) hours as needed. 10/11/15   Tammy  Triplett, PA-C   BP 132/72 mmHg  Pulse 108  Temp(Src) 97.6 F (36.4 C)  Resp 20  Ht 5\' 7"  (1.702 m)  Wt 360 lb (163.295 kg)  BMI 56.37 kg/m2  SpO2 97% Physical Exam  Constitutional: He is oriented to person, place, and time. He appears well-developed and well-nourished.  HENT:  Head: Normocephalic and atraumatic.  Eyes: EOM are normal.  Neck: Normal range of motion.  Cardiovascular: Normal rate, regular rhythm, normal heart sounds and intact distal pulses.   Pulmonary/Chest: Effort normal and breath sounds normal. No respiratory distress.  Abdominal: Soft. He exhibits no distension. There is no tenderness.  Musculoskeletal:  Painful range of motion of his right wrist and his left hip as well as his left knee.  Neurological: He is alert and oriented to person, place, and time.  Skin: Skin is warm and dry.  Psychiatric: He has a normal mood and affect. Judgment normal.  Nursing note and vitals reviewed.   ED Course  Procedures  DIAGNOSTIC STUDIES: Oxygen Saturation is 97% on RA, adequate by my interpretation.    COORDINATION OF CARE: 12:56 AM Discussed treatment plan with pt at bedside which includes pain Rx and pt agreed to plan.  Labs Review Labs Reviewed  CBC - Abnormal; Notable for the following:    WBC 12.7 (*)    All other components within normal limits  BASIC METABOLIC PANEL - Abnormal; Notable for the following:    Sodium 134 (*)    Calcium 8.8 (*)    All other components within normal limits  CBG MONITORING, ED    Imaging Review No results found. I have personally reviewed and evaluated these images and lab results as part of my medical decision-making.   EKG Interpretation None      MDM   Final diagnoses:  None    5:14 AM Pain is significantly improved at this time.  He is much more comfortable.  He relaxed-appearing on his bed reading on his phone.  Patient will need close follow-up with his primary care doctor.  I'll write a prescription for  colchicine and anti-inflammatories for home.  Unfortunately the patient has received a by mouth pain medication from this emergency department several times this year for gout related pain.  I do not think it is appropriate that this pain medication continues to come from the ER.  He understands this and will speak with his physician further about pain management.  Medical screening examination completed.  Doubt life-threatening emergency.  I personally performed the services described in this documentation, which was scribed in my presence. The recorded information has been reviewed and is accurate.        Azalia BilisKevin Issiac Jamar, MD 11/02/15 [phone]

## 2015-11-02 NOTE — Discharge Instructions (Signed)

## 2017-06-07 ENCOUNTER — Encounter (HOSPITAL_COMMUNITY): Payer: Self-pay | Admitting: Emergency Medicine

## 2017-06-07 ENCOUNTER — Emergency Department (HOSPITAL_COMMUNITY): Payer: Medicaid Other

## 2017-06-07 ENCOUNTER — Emergency Department (HOSPITAL_COMMUNITY)
Admission: EM | Admit: 2017-06-07 | Discharge: 2017-06-07 | Disposition: A | Discharge status: Home / Self Care | Payer: Medicaid Other | Attending: Emergency Medicine | Admitting: Emergency Medicine

## 2017-06-07 DIAGNOSIS — M19072 Primary osteoarthritis, left ankle and foot: Secondary | ICD-10-CM | POA: Diagnosis not present

## 2017-06-07 DIAGNOSIS — F17210 Nicotine dependence, cigarettes, uncomplicated: Secondary | ICD-10-CM | POA: Insufficient documentation

## 2017-06-07 DIAGNOSIS — M79672 Pain in left foot: Secondary | ICD-10-CM | POA: Insufficient documentation

## 2017-06-07 DIAGNOSIS — Z79891 Long term (current) use of opiate analgesic: Secondary | ICD-10-CM | POA: Insufficient documentation

## 2017-06-07 DIAGNOSIS — B353 Tinea pedis: Secondary | ICD-10-CM | POA: Insufficient documentation

## 2017-06-07 MED ORDER — HYDROCODONE-ACETAMINOPHEN 5-325 MG PO TABS: ORAL_TABLET | ORAL | 0 refills | Status: DC

## 2017-06-07 MED ORDER — DEXAMETHASONE SODIUM PHOSPHATE 4 MG/ML IJ SOLN
10.0000 mg | Freq: Once | INTRAMUSCULAR | Status: AC
  Administered 2017-06-07: 10 mg via INTRAMUSCULAR
  Filled 2017-06-07: qty 3

## 2017-06-07 MED ORDER — MICONAZOLE NITRATE 2 % EX AERO
INHALATION_SPRAY | CUTANEOUS | 1 refills | Status: DC
Start: 2017-06-07 — End: 2017-12-29

## 2017-06-07 NOTE — ED Provider Notes (Signed)
[facility] Provider Note   CSN: [id number] Arrival date & time: 06/07/17  1423     History   Chief Complaint Chief Complaint  Patient presents with  . Foot Pain    HPI [name] is a 32 y.o. male.  HPI   [name] is a 32 y.o. male who presents to the Emergency Department complaining of persistent, worsening pain to the left foot.  He reports chronic pain to both feet secondary to previous injuries and fractures.  Pain to left foot has been increasing for 3 days.  Pain associated with weight bearing.  He has not tried any medication for symptom relief.  He denies recent injury.  Also denies fever, chills, open wounds and pain proximal to the ankle.     Past Medical History:  Diagnosis Date  . Acute blood loss anemia 09/02/2012   S/p 1 unit rbcs  . Cellulitis   . Erosive esophagitis 09/01/2012   NSAID induced.  [facility]. Gall stones   . Gastric ulcer with hemorrhage 09/02/2012   s/p bleeding control tx. Per Dr. Jena Gaussourk  . Gout   . Obesity   . UTI (lower urinary tract infection)     Patient Active Problem List   Diagnosis Date Noted  . Rectal bleed 07/30/2014  . Hiatal hernia   . Hemorrhoid   . Acute lower GI bleeding 07/29/2014  . Abdominal pain   . Wrist pain, acute   . Tenosynovitis of finger, hand, or wrist   . Cellulitis of hand 03/10/2014  . Right upper quadrant abdominal pain 02/10/2014  . Cholelithiasis 02/10/2014  . Polyarthritis 11/08/2012  . UTI (lower urinary tract infection) 10/21/2012  . Cellulitis 09/17/2012  . Tinea pedis 09/17/2012  . Erosive esophagitis 09/07/2012  . Hypokalemia 09/05/2012  . Gastric ulcer with hemorrhage 09/02/2012  . Upper GI bleed 09/02/2012  . Acute blood loss anemia 09/02/2012  . Hematemesis 09/01/2012  . Normocytic anemia 09/01/2012  . Acute gouty arthritis 09/01/2012  . Morbid obesity (HCC) 09/01/2012    Past Surgical History:  Procedure Laterality Date  . COLONOSCOPY WITH PROPOFOL  N/A 07/30/2014   RMR: Colonoscopy anal canal/internal hemorrhoids, otherwise normal ileocolonoscoopy.   . ESOPHAGOGASTRODUODENOSCOPY Left 09/01/2012   BJY:[id number]:Distal esophageal erosions consistent with erosive reflux esophagitis/Pre-pyloric benign-appearing gastric ulcer with bleeding stigmata status post bleeding control therapy as described above  . ESOPHAGOGASTRODUODENOSCOPY N/A 12/13/2012   OZH:YQMVHQRMR:Hiatal hernia. PReviously noted gastric ulcer completely healed.   . ESOPHAGOGASTRODUODENOSCOPY (EGD) WITH PROPOFOL N/A 07/30/2014   Procedure: ESOPHAGOGASTRODUODENOSCOPY (EGD) WITH PROPOFOL;  Surgeon: Corbin Adeobert M Rourk, MD;  Location: [facility];  Service: Endoscopy;  Laterality: N/A;  . TONSILLECTOMY         Home Medications    Prior to Admission medications   Medication Sig Start Date End Date Taking? Authorizing Provider  acetaminophen (TYLENOL) 325 MG tablet Take 2-3 tablets (650-975 mg total) by mouth daily as needed for mild pain or moderate pain. 05/07/15   Mesner, Barbara CowerJason, MD  allopurinol (ZYLOPRIM) 300 MG tablet Take 300 mg by mouth daily.    [provider]  calcium carbonate (TUMS - DOSED IN MG ELEMENTAL CALCIUM) 500 MG chewable tablet Chew 1 tablet by mouth 2 (two) times daily.    [provider]  colchicine 0.6 MG tablet 1.2 mg PO at first sign of flare, then 0.6 mg 1 hr later; not to exceed 1.8 mg in 1-hr period 11/02/15   Azalia Bilisampos, Kevin, MD  cyclobenzaprine (FLEXERIL) 10 MG tablet  Take 1 tablet (10 mg total) by mouth 3 (three) times daily as needed. 10/11/15   Danell Vazquez, PA-C  gabapentin (NEURONTIN) 300 MG capsule Take 300-600 mg by mouth 3 (three) times daily as needed (pain).     [provider]  HYDROcodone-acetaminophen (NORCO/VICODIN) 5-325 MG tablet Take one tab po q 4 hrs prn pain 06/07/17   Bensyn Bornemann, PA-C  Miconazole Nitrate 2 % AERO Apply to clean, dry feet twice a day. 06/07/17   Pauline Aus, PA-C    Family History Family History  Problem  Relation Age of Onset  . Heart failure Mother   . Colon cancer Neg Hx     Social History Social History   Tobacco Use  . Smoking status: Heavy Tobacco Smoker    Packs/day: 1.00    Years: 7.00    Pack years: 7.00    Types: Cigarettes  . Smokeless tobacco: Current User    Types: Snuff  . Tobacco comment: daily in the evenings\  Substance Use Topics  . Alcohol use: No    Alcohol/week: 0.0 oz  . Drug use: Yes    Types: Marijuana    Comment: if in pain     Allergies   Asa [aspirin]; Clindamycin/lincomycin; Nsaids; Penicillins; and Sulfa antibiotics   Review of Systems Review of Systems  Constitutional: Negative for chills and fever.  Respiratory: Negative for shortness of breath.   Cardiovascular: Negative for chest pain.  Musculoskeletal: Positive for arthralgias (left foot pain and swelling.).  Skin: Negative for wound.       Redness, itching of distal left foot and toes.   Neurological: Negative for weakness and numbness.  All other systems reviewed and are negative.    Physical Exam Updated Vital Signs BP 111/83 (BP Location: Right Arm)   Pulse 86   Temp 98.1 F (36.7 C) (Oral)   Resp 18   Ht 5\' 7"  (1.702 m)   Wt (!) 162.4 kg (358 lb)   SpO2 100%   BMI 56.07 kg/m   Physical Exam  Constitutional: He is oriented to person, place, and time. He appears well-developed and well-nourished. No distress.  Pt is obese  HENT:  Head: Normocephalic.  Cardiovascular: Normal rate, regular rhythm and intact distal pulses.  DP and PT pulses are palable.   Pulmonary/Chest: Effort normal and breath sounds normal. No respiratory distress.  Musculoskeletal: He exhibits tenderness. He exhibits no edema.  Diffuse ttp and mild edema of the dorsal left foot.  Mild erythema and excoriations of the dorsal toes and web spaces.  No proximal tenderness. No lymphangitis.  Neurological: He is alert and oriented to person, place, and time. No sensory deficit. He exhibits normal muscle  tone. Coordination normal.  Skin: Skin is warm and dry. Capillary refill takes less than 2 seconds.  Psychiatric: He has a normal mood and affect.  Nursing note and vitals reviewed.    ED Treatments / Results  Labs (all labs ordered are listed, but only abnormal results are displayed) Labs Reviewed - No data to display  EKG  EKG Interpretation None       Radiology Dg Foot Complete Left  Result Date: 06/07/2017 CLINICAL DATA:  Left foot pain and swelling for 3 days. No known injury. EXAM: LEFT FOOT - COMPLETE 3+ VIEW COMPARISON:  Plain films of the left foot 03/03/2013. FINDINGS: No acute bony or joint abnormality is identified. There is solid ankylosis across all the tarsometatarsal joints. Advanced talonavicular and calcaneocuboid osteoarthritis is present. Soft tissues  of the foot are unchanged in appearance. IMPRESSION: No acute bony abnormality. 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 Electronically Signed   By: Drusilla Kanner M.D.   On: 06/07/2017 15:21    Procedures Procedures (including critical care time)  Medications Ordered in ED Medications  dexamethasone (DECADRON) injection 10 mg (10 mg Intramuscular Given 06/07/17 1625)     Initial Impression / Assessment and Plan / ED Course  I have reviewed the triage vital signs and the nursing notes.  Pertinent labs & imaging results that were available during my care of the patient were reviewed by me and considered in my medical decision making (see chart for details).     Obese pt with significant OA of the foot by XR.  NV intact.  Mild erythema and excoriations of the foot.  Likely erythema related to tinea pedis, but also discussed possible early cellulitis.  Pt prefers not to take abx, stating that he "has allergies to every antibiotic"  Agrees to warm water soaks and close PCP f/u, discussed return precautions and pt given referral info for podiatry   Final  Clinical Impressions(s) / ED Diagnoses   Final diagnoses:  Foot pain, left  Osteoarthritis of left foot, unspecified osteoarthritis type  Tinea pedis of left foot    ED Discharge Orders        Ordered    Miconazole Nitrate 2 % AERO     06/07/17 1625    HYDROcodone-acetaminophen (NORCO/VICODIN) 5-325 MG tablet     06/07/17 1627       Pauline Aus, PA-C 06/07/17 1722    Rolland Porter, MD 06/07/17 2340

## 2017-06-07 NOTE — Discharge Instructions (Signed)
Elevate your foot when possible.  Call one of the podiatrists listed to arrange a follow-up appt

## 2017-06-07 NOTE — ED Notes (Signed)
Patient seen and evaluated by EDPa for initial assessment. 

## 2017-06-07 NOTE — ED Triage Notes (Addendum)
Patient complaining of sharp pain to top of his left foot x 3 days. Denies injury.

## 2017-12-29 ENCOUNTER — Other Ambulatory Visit: Payer: Self-pay

## 2017-12-29 ENCOUNTER — Observation Stay (HOSPITAL_COMMUNITY)
Admission: EM | Admit: 2017-12-29 | Discharge: 2017-12-30 | Disposition: A | Discharge status: Home / Self Care | Payer: Medicaid Other | Attending: Family Medicine | Admitting: Family Medicine

## 2017-12-29 ENCOUNTER — Encounter (HOSPITAL_COMMUNITY): Payer: Self-pay

## 2017-12-29 ENCOUNTER — Emergency Department (HOSPITAL_COMMUNITY): Payer: Medicaid Other

## 2017-12-29 DIAGNOSIS — J9601 Acute respiratory failure with hypoxia: Secondary | ICD-10-CM | POA: Diagnosis not present

## 2017-12-29 DIAGNOSIS — J209 Acute bronchitis, unspecified: Secondary | ICD-10-CM | POA: Diagnosis not present

## 2017-12-29 DIAGNOSIS — Z23 Encounter for immunization: Secondary | ICD-10-CM | POA: Insufficient documentation

## 2017-12-29 DIAGNOSIS — Z6841 Body Mass Index (BMI) 40.0 and over, adult: Secondary | ICD-10-CM | POA: Diagnosis present

## 2017-12-29 DIAGNOSIS — J40 Bronchitis, not specified as acute or chronic: Secondary | ICD-10-CM

## 2017-12-29 DIAGNOSIS — Z79899 Other long term (current) drug therapy: Secondary | ICD-10-CM | POA: Diagnosis not present

## 2017-12-29 DIAGNOSIS — R05 Cough: Secondary | ICD-10-CM | POA: Diagnosis present

## 2017-12-29 DIAGNOSIS — K208 Other esophagitis: Secondary | ICD-10-CM | POA: Diagnosis not present

## 2017-12-29 DIAGNOSIS — M109 Gout, unspecified: Secondary | ICD-10-CM

## 2017-12-29 DIAGNOSIS — K2210 Ulcer of esophagus without bleeding: Secondary | ICD-10-CM | POA: Diagnosis present

## 2017-12-29 DIAGNOSIS — F17210 Nicotine dependence, cigarettes, uncomplicated: Secondary | ICD-10-CM | POA: Insufficient documentation

## 2017-12-29 DIAGNOSIS — Z720 Tobacco use: Secondary | ICD-10-CM

## 2017-12-29 LAB — CBC WITH DIFFERENTIAL/PLATELET
Basophils Absolute: 0 10*3/uL (ref 0.0–0.1)
Basophils Relative: 0 %
EOS ABS: 0.5 10*3/uL (ref 0.0–0.7)
EOS PCT: 5 %
HCT: 45.5 % (ref 39.0–52.0)
Hemoglobin: 15.2 g/dL (ref 13.0–17.0)
LYMPHS ABS: 2.2 10*3/uL (ref 0.7–4.0)
Lymphocytes Relative: 23 %
MCH: 30.2 pg (ref 26.0–34.0)
MCHC: 33.4 g/dL (ref 30.0–36.0)
MCV: 90.5 fL (ref 78.0–100.0)
Monocytes Absolute: 1 10*3/uL (ref 0.1–1.0)
Monocytes Relative: 10 %
Neutro Abs: 6.2 10*3/uL (ref 1.7–7.7)
Neutrophils Relative %: 62 %
PLATELETS: 204 10*3/uL (ref 150–400)
RBC: 5.03 MIL/uL (ref 4.22–5.81)
RDW: 13.7 % (ref 11.5–15.5)
WBC: 9.9 10*3/uL (ref 4.0–10.5)

## 2017-12-29 LAB — TROPONIN I

## 2017-12-29 LAB — BASIC METABOLIC PANEL
Anion gap: 9 (ref 5–15)
BUN: 12 mg/dL (ref 6–20)
CALCIUM: 9.1 mg/dL (ref 8.9–10.3)
CO2: 28 mmol/L (ref 22–32)
Chloride: 105 mmol/L (ref 98–111)
Creatinine, Ser: 0.81 mg/dL (ref 0.61–1.24)
GFR calc Af Amer: >60 mL/min (ref >60)
Glucose, Bld: 99 mg/dL (ref 70–99)
POTASSIUM: 3.8 mmol/L (ref 3.5–5.1)
SODIUM: 142 mmol/L (ref 135–145)

## 2017-12-29 LAB — D-DIMER, QUANTITATIVE: D-Dimer, Quant: 0.46 ug/mL-FEU (ref 0.00–0.50)

## 2017-12-29 MED ORDER — ACETAMINOPHEN 325 MG PO TABS
650.0000 mg | ORAL_TABLET | Freq: Once | ORAL | Status: AC
  Administered 2017-12-29: 650 mg via ORAL
  Filled 2017-12-29: qty 2

## 2017-12-29 MED ORDER — ALBUTEROL SULFATE (2.5 MG/3ML) 0.083% IN NEBU
INHALATION_SOLUTION | RESPIRATORY_TRACT | Status: AC
  Administered 2017-12-29: 2.5 mg
  Filled 2017-12-29: qty 3

## 2017-12-29 MED ORDER — ONDANSETRON HCL 4 MG PO TABS: 4.0000 mg | ORAL_TABLET | Freq: Four times a day (QID) | ORAL | Status: DC | PRN

## 2017-12-29 MED ORDER — INFLUENZA VAC SPLIT QUAD 0.5 ML IM SUSY
0.5000 mL | PREFILLED_SYRINGE | INTRAMUSCULAR | Status: AC
  Administered 2017-12-30: 0.5 mL via INTRAMUSCULAR
  Filled 2017-12-29: qty 0.5

## 2017-12-29 MED ORDER — MAGNESIUM SULFATE 2 GM/50ML IV SOLN
2.0000 g | Freq: Once | INTRAVENOUS | Status: AC
  Administered 2017-12-29: 2 g via INTRAVENOUS
  Filled 2017-12-29: qty 50

## 2017-12-29 MED ORDER — GUAIFENESIN ER 600 MG PO TB12
1200.0000 mg | ORAL_TABLET | Freq: Two times a day (BID) | ORAL | Status: DC
  Administered 2017-12-29 – 2017-12-30 (×3): 1200 mg via ORAL
  Filled 2017-12-29 (×8): qty 2

## 2017-12-29 MED ORDER — SODIUM CHLORIDE 0.9% FLUSH
3.0000 mL | Freq: Two times a day (BID) | INTRAVENOUS | Status: DC
  Administered 2017-12-29 – 2017-12-30 (×3): 3 mL via INTRAVENOUS

## 2017-12-29 MED ORDER — IPRATROPIUM BROMIDE 0.02 % IN SOLN
0.5000 mg | Freq: Once | RESPIRATORY_TRACT | Status: AC
  Administered 2017-12-29: 0.5 mg via RESPIRATORY_TRACT
  Filled 2017-12-29: qty 2.5

## 2017-12-29 MED ORDER — GABAPENTIN 300 MG PO CAPS: 300.0000 mg | ORAL_CAPSULE | Freq: Three times a day (TID) | ORAL | Status: DC | PRN

## 2017-12-29 MED ORDER — PREDNISONE 50 MG PO TABS
60.0000 mg | ORAL_TABLET | Freq: Once | ORAL | Status: AC
  Administered 2017-12-29: 60 mg via ORAL
  Filled 2017-12-29: qty 1

## 2017-12-29 MED ORDER — ALLOPURINOL 300 MG PO TABS
300.0000 mg | ORAL_TABLET | Freq: Every day | ORAL | Status: DC
  Administered 2017-12-29 – 2017-12-30 (×2): 300 mg via ORAL
  Filled 2017-12-29 (×2): qty 1

## 2017-12-29 MED ORDER — CALCIUM CARBONATE ANTACID 500 MG PO CHEW
1.0000 | CHEWABLE_TABLET | Freq: Two times a day (BID) | ORAL | Status: DC
  Administered 2017-12-29 – 2017-12-30 (×3): 200 mg via ORAL
  Filled 2017-12-29 (×3): qty 1

## 2017-12-29 MED ORDER — ACETAMINOPHEN 650 MG RE SUPP: 650.0000 mg | Freq: Four times a day (QID) | RECTAL | Status: DC | PRN

## 2017-12-29 MED ORDER — DOXYCYCLINE HYCLATE 100 MG PO TABS
100.0000 mg | ORAL_TABLET | Freq: Two times a day (BID) | ORAL | Status: DC
  Administered 2017-12-29 – 2017-12-30 (×2): 100 mg via ORAL
  Filled 2017-12-29 (×2): qty 1

## 2017-12-29 MED ORDER — PANTOPRAZOLE SODIUM 40 MG PO TBEC
40.0000 mg | DELAYED_RELEASE_TABLET | Freq: Every day | ORAL | Status: DC
  Administered 2017-12-29 – 2017-12-30 (×2): 40 mg via ORAL
  Filled 2017-12-29 (×2): qty 1

## 2017-12-29 MED ORDER — SODIUM CHLORIDE 0.9 % IV BOLUS
1000.0000 mL | Freq: Once | INTRAVENOUS | Status: AC
Start: 2017-12-29 — End: 2017-12-29
  Administered 2017-12-29: 1000 mL via INTRAVENOUS

## 2017-12-29 MED ORDER — DOXYCYCLINE HYCLATE 100 MG PO TABS: 100.0000 mg | ORAL_TABLET | Freq: Two times a day (BID) | ORAL | Status: DC

## 2017-12-29 MED ORDER — IPRATROPIUM-ALBUTEROL 0.5-2.5 (3) MG/3ML IN SOLN
3.0000 mL | Freq: Four times a day (QID) | RESPIRATORY_TRACT | Status: DC
  Administered 2017-12-29 – 2017-12-30 (×5): 3 mL via RESPIRATORY_TRACT
  Filled 2017-12-29 (×6): qty 3

## 2017-12-29 MED ORDER — GI COCKTAIL ~~LOC~~
30.0000 mL | Freq: Once | ORAL | Status: AC
  Administered 2017-12-29: 30 mL via ORAL
  Filled 2017-12-29: qty 30

## 2017-12-29 MED ORDER — ENOXAPARIN SODIUM 100 MG/ML ~~LOC~~ SOLN
90.0000 mg | SUBCUTANEOUS | Status: DC
  Administered 2017-12-29: 90 mg via SUBCUTANEOUS
  Filled 2017-12-29: qty 1

## 2017-12-29 MED ORDER — DOXYCYCLINE HYCLATE 100 MG PO TABS
100.0000 mg | ORAL_TABLET | Freq: Once | ORAL | Status: AC
  Administered 2017-12-29: 100 mg via ORAL
  Filled 2017-12-29: qty 1

## 2017-12-29 MED ORDER — ONDANSETRON HCL 4 MG/2ML IJ SOLN: 4.0000 mg | Freq: Four times a day (QID) | INTRAMUSCULAR | Status: DC | PRN

## 2017-12-29 MED ORDER — BUDESONIDE 0.25 MG/2ML IN SUSP
0.2500 mg | Freq: Two times a day (BID) | RESPIRATORY_TRACT | Status: DC
  Administered 2017-12-29 – 2017-12-30 (×3): 0.25 mg via RESPIRATORY_TRACT
  Filled 2017-12-29 (×8): qty 2

## 2017-12-29 MED ORDER — PREDNISONE 20 MG PO TABS
50.0000 mg | ORAL_TABLET | Freq: Two times a day (BID) | ORAL | Status: DC
  Administered 2017-12-29 – 2017-12-30 (×3): 50 mg via ORAL
  Filled 2017-12-29 (×3): qty 2

## 2017-12-29 MED ORDER — BENZONATATE 100 MG PO CAPS
200.0000 mg | ORAL_CAPSULE | Freq: Three times a day (TID) | ORAL | Status: DC
  Administered 2017-12-29 – 2017-12-30 (×4): 200 mg via ORAL
  Filled 2017-12-29 (×4): qty 2

## 2017-12-29 MED ORDER — ACETAMINOPHEN 325 MG PO TABS
650.0000 mg | ORAL_TABLET | Freq: Four times a day (QID) | ORAL | Status: DC | PRN
  Administered 2017-12-29 – 2017-12-30 (×2): 650 mg via ORAL
  Filled 2017-12-29 (×2): qty 2

## 2017-12-29 MED ORDER — CYCLOBENZAPRINE HCL 10 MG PO TABS: 10.0000 mg | ORAL_TABLET | Freq: Three times a day (TID) | ORAL | Status: DC | PRN

## 2017-12-29 MED ORDER — IPRATROPIUM-ALBUTEROL 0.5-2.5 (3) MG/3ML IN SOLN
3.0000 mL | Freq: Once | RESPIRATORY_TRACT | Status: AC
  Administered 2017-12-29: 3 mL via RESPIRATORY_TRACT
  Filled 2017-12-29: qty 3

## 2017-12-29 MED ORDER — ALBUTEROL (5 MG/ML) CONTINUOUS INHALATION SOLN
10.0000 mg/h | INHALATION_SOLUTION | Freq: Once | RESPIRATORY_TRACT | Status: AC
  Administered 2017-12-29: 10 mg/h via RESPIRATORY_TRACT
  Filled 2017-12-29: qty 20

## 2017-12-29 MED ORDER — HYDROCODONE-ACETAMINOPHEN 5-325 MG PO TABS
1.0000 | ORAL_TABLET | Freq: Four times a day (QID) | ORAL | Status: DC | PRN
  Administered 2017-12-29 – 2017-12-30 (×2): 1 via ORAL
  Filled 2017-12-29 (×2): qty 1

## 2017-12-29 MED ORDER — SODIUM CHLORIDE 0.9 % IV SOLN: 250.0000 mL | INTRAVENOUS | Status: DC | PRN

## 2017-12-29 MED ORDER — SODIUM CHLORIDE 0.9% FLUSH: 3.0000 mL | INTRAVENOUS | Status: DC | PRN

## 2017-12-29 MED ORDER — NICOTINE 14 MG/24HR TD PT24
14.0000 mg | MEDICATED_PATCH | Freq: Every day | TRANSDERMAL | Status: DC
  Administered 2017-12-29 – 2017-12-30 (×2): 14 mg via TRANSDERMAL
  Filled 2017-12-29 (×2): qty 1

## 2017-12-29 NOTE — ED Notes (Signed)
Ambulated patient approximately 30 feet when oxygen dropped to 90%. Turned around and walked back to room. Pt had to stop in door way with a coughing spell. During coughing spell, O2 sats dropped to 86%

## 2017-12-29 NOTE — Progress Notes (Signed)
12/29/2017  1:15 AM  12/29/2017 7:43 AM  Gardiner CoinsMichael E Fleissner was seen and examined.  The H&P by the admitting provider, orders, imaging was reviewed.  Please see new orders.  Will continue to follow.   Maryln Manuel. Kaelani Kendrick, MD [facility]

## 2017-12-29 NOTE — ED Triage Notes (Signed)
Persistent productive cough x several days, taking otc meds with minimal relief.

## 2017-12-29 NOTE — ED Provider Notes (Addendum)
[facility] EMERGENCY DEPARTMENT Provider Note   CSN: [id number] Arrival date & time: 12/29/17  0112     History   Chief Complaint Chief Complaint  Patient presents with  . Cough    HPI [name] is a 32 y.o. male.  Morbidly obese male presenting with a several day history of productive cough of green-yellow mucus.  He is a smoker but denies any diagnosis of asthma or COPD.  He has a headache and chest tightness when he coughs that lasts for a few seconds at a time.  Pain does not radiate to his arms, back or neck.  Has had chills but no fever.  No abdominal pain, vomiting or diarrhea.  He is having reduced his smoking due to feeling short of breath and coughing.  He is been taking over-the-counter sinus medication without relief.  Denies any new leg pain or leg swelling.  The history is provided by the patient.  Cough  Associated symptoms include chest pain, chills, headaches, rhinorrhea, myalgias and shortness of breath.    Past Medical History:  Diagnosis Date  . Acute blood loss anemia 09/02/2012   S/p 1 unit rbcs  . Cellulitis   . Erosive esophagitis 09/01/2012   NSAID induced.  [place]ri Ray stones   . Gastric ulcer with hemorrhage 09/02/2012   s/p bleeding control tx. Per Dr. Jena Gauss  . Gout   . Obesity   . UTI (lower urinary tract infection)     Patient Active Problem List   Diagnosis Date Noted  . Rectal bleed 07/30/2014  . Hiatal hernia   . Hemorrhoid   . Acute lower GI bleeding 07/29/2014  . Abdominal pain   . Wrist pain, acute   . Tenosynovitis of finger, hand, or wrist   . Cellulitis of hand 03/10/2014  . Right upper quadrant abdominal pain 02/10/2014  . Cholelithiasis 02/10/2014  . Polyarthritis 11/08/2012  . UTI (lower urinary tract infection) 10/21/2012  . Cellulitis 09/17/2012  . Tinea pedis 09/17/2012  . Erosive esophagitis 09/07/2012  . Hypokalemia 09/05/2012  . Gastric ulcer with hemorrhage 09/02/2012  . Upper GI bleed 09/02/2012  . Acute  blood loss anemia 09/02/2012  . Hematemesis 09/01/2012  . Normocytic anemia 09/01/2012  . Acute gouty arthritis 09/01/2012  . Morbid obesity (HCC) 09/01/2012    Past Surgical History:  Procedure Laterality Date  . COLONOSCOPY WITH PROPOFOL N/A 07/30/2014   RMR: Colonoscopy anal canal/internal hemorrhoids, otherwise normal ileocolonoscoopy.   . ESOPHAGOGASTRODUODENOSCOPY Left 09/01/2012   WUJ:WJXBJY esophageal erosions consistent with erosive reflux esophagitis/Pre-pyloric benign-appearing gastric ulcer with bleeding stigmata status post bleeding control therapy as described above  . ESOPHAGOGASTRODUODENOSCOPY N/A 12/13/2012   NWG:[id number] hernia. PReviously noted gastric ulcer completely healed.   . ESOPHAGOGASTRODUODENOSCOPY (EGD) WITH PROPOFOL N/A 07/30/2014   Procedure: ESOPHAGOGASTRODUODENOSCOPY (EGD) WITH PROPOFOL;  Surgeon: Corbin Ade, MD;  Location: [facility];  Service: Endoscopy;  Laterality: N/A;  . TONSILLECTOMY          Home Medications    Prior to Admission medications   Medication Sig Start Date End Date Taking? Authorizing Provider  acetaminophen (TYLENOL) 325 MG tablet Take 2-3 tablets (650-975 mg total) by mouth daily as needed for mild pain or moderate pain. 05/07/15   Mesner, Barbara Cower, MD  allopurinol (ZYLOPRIM) 300 MG tablet Take 300 mg by mouth daily.    [provider]  calcium carbonate (TUMS - DOSED IN MG ELEMENTAL CALCIUM) 500 MG chewable tablet Chew 1 tablet by mouth 2 (two) times daily.  [provider]  colchicine 0.6 MG tablet 1.2 mg PO at first sign of flare, then 0.6 mg 1 hr later; not to exceed 1.8 mg in 1-hr period 11/02/15   Azalia Bilis, MD  cyclobenzaprine (FLEXERIL) 10 MG tablet Take 1 tablet (10 mg total) by mouth 3 (three) times daily as needed. 10/11/15   Triplett, Tammy, PA-C  gabapentin (NEURONTIN) 300 MG capsule Take 300-600 mg by mouth 3 (three) times daily as needed (pain).     [provider]  HYDROcodone-acetaminophen  (NORCO/VICODIN) 5-325 MG tablet Take one tab po q 4 hrs prn pain 06/07/17   Triplett, Tammy, PA-C  Miconazole Nitrate 2 % AERO Apply to clean, dry feet twice a day. 06/07/17   Pauline Aus, PA-C    Family History Family History  Problem Relation Age of Onset  . Heart failure Mother   . Colon cancer Neg Hx     Social History Social History   Tobacco Use  . Smoking status: Heavy Tobacco Smoker    Packs/day: 1.00    Years: 7.00    Pack years: 7.00    Types: Cigarettes  . Smokeless tobacco: Current User    Types: Snuff  . Tobacco comment: daily in the evenings\  Substance Use Topics  . Alcohol use: No    Alcohol/week: 0.0 standard drinks  . Drug use: Yes    Types: Marijuana    Comment: if in pain     Allergies   Asa [aspirin]; Clindamycin/lincomycin; Nsaids; Penicillins; and Sulfa antibiotics   Review of Systems Review of Systems  Constitutional: Positive for chills and fatigue. Negative for activity change, appetite change and fever.  HENT: Positive for congestion and rhinorrhea.   Eyes: Negative for visual disturbance.  Respiratory: Positive for cough, chest tightness and shortness of breath.   Cardiovascular: Positive for chest pain.  Gastrointestinal: Negative for abdominal pain, nausea and vomiting.  Genitourinary: Negative for dysuria and hematuria.  Musculoskeletal: Positive for arthralgias and myalgias.  Neurological: Positive for headaches. Negative for dizziness and weakness.   all other systems are negative except as noted in the HPI and PMH.     Physical Exam Updated Vital Signs BP 138/74 (BP Location: Right Arm)   Pulse 87   Temp 98.8 F (37.1 C) (Oral)   Resp (!) 22   SpO2 98%   Physical Exam  Constitutional: He is oriented to person, place, and time. He appears well-developed and well-nourished. No distress.  Speaking full sentences, no distress  HENT:  Head: Normocephalic and atraumatic.  Mouth/Throat: Oropharynx is clear and moist. No  oropharyngeal exudate.  Eyes: Pupils are equal, round, and reactive to light. Conjunctivae and EOM are normal.  Neck: Normal range of motion. Neck supple.  No meningismus.  Cardiovascular: Normal rate, regular rhythm, normal heart sounds and intact distal pulses.  No murmur heard. Pulmonary/Chest: Effort normal. No respiratory distress. He has wheezes. He exhibits no tenderness.  Expiratory wheezing bilaterally with moderate air exchange  Abdominal: Soft. There is no tenderness. There is no rebound and no guarding.  Musculoskeletal: Normal range of motion. He exhibits no edema or tenderness.  Neurological: He is alert and oriented to person, place, and time. No cranial nerve deficit. He exhibits normal muscle tone. Coordination normal.  No ataxia on finger to nose bilaterally. No pronator drift. 5/5 strength throughout. CN 2-12 intact.Equal grip strength. Sensation intact.   Skin: Skin is warm.  Psychiatric: He has a normal mood and affect. His behavior is normal.  Nursing  note and vitals reviewed.    ED Treatments / Results  Labs (all labs ordered are listed, but only abnormal results are displayed) Labs Reviewed  CBC WITH DIFFERENTIAL/PLATELET  BASIC METABOLIC PANEL  TROPONIN I  D-DIMER, QUANTITATIVE (NOT AT [facility])  HIV ANTIBODY (ROUTINE TESTING W REFLEX)  CBC  CREATININE, SERUM    EKG EKG Interpretation  Date/Time:  Saturday December 29 2017 01:52:45 EDT Ventricular Rate:  87 PR Interval:    QRS Duration: 99 QT Interval:  361 QTC Calculation: 435 R Axis:   92 Text Interpretation:  Sinus rhythm Borderline right axis deviation No significant change was found Confirmed by Glynn Octaveancour, Rukiya Hodgkins [phone]([id number]) on 12/29/2017 2:08:52 AM   Radiology Dg Chest 2 View  Result Date: 12/29/2017 CLINICAL DATA:  Productive cough for several days, chest pain. EXAM: CHEST - 2 VIEW COMPARISON:  CT chest October 11, 2015 FINDINGS: Cardiomediastinal silhouette is normal. No pleural effusions or focal  consolidations. LEFT lung base strandy densities. Trachea projects midline and there is no pneumothorax. Soft tissue planes and included osseous structures are non-suspicious. Mild degenerative changes thoracic spine. Large body habitus. IMPRESSION: LEFT lung base atelectasis. Electronically Signed   By: Awilda Metroourtnay  Bloomer M.D.   On: 12/29/2017 02:52    Procedures Procedures (including critical care time)  Medications Ordered in ED Medications  ipratropium-albuterol (DUONEB) 0.5-2.5 (3) MG/3ML nebulizer solution 3 mL (has no administration in time range)  predniSONE (DELTASONE) tablet 60 mg (60 mg Oral Given 12/29/17 0147)     Initial Impression / Assessment and Plan / ED Course  I have reviewed the triage vital signs and the nursing notes.  Pertinent labs & imaging results that were available during my care of the patient were reviewed by me and considered in my medical decision making (see chart for details).    Obese smoker with several days of cough, chest pain with coughing as well as headache.  He is in no distress but is wheezing on exam.  bronchodilators and steroids will be given.  Chest x-ray be obtained.  X-ray negative for pneumonia.  Wheezing is improved with nebulizers and steroids.  Labs are reassuring with negative troponin and d-dimer.  EKG sinus rhythm.  Patient received 2 nebulizers as well as 60 mg of prednisone.  On attempted ambulation O2 saturation dropped to 90% and 86% with a coughing spell.  Patient will be given additional nebulizers as well as IV magnesium.  After additional hour-long nebulizer and IV magnesium, patient is exchanging air well with minimal wheezing.  On attempted ambulation he becomes very short of breath with labored breathing and desaturates to the high 80s.  Given his degree of dyspnea, plan admission for further treatment.  Continue bronchodilators, steroids and antibiotics.  Discussed with patient that steroids could potentially worsen his  esophagitis.  Discussed he needs to stop smoking. Low suspicion for ACS or pulmonary embolism.  Admission discussed with Dr. Sherryll BurgerShah.  CRITICAL CARE Performed by: Glynn OctaveANCOUR, Lesle Faron Total critical care time: 32 minutes Critical care time was exclusive of separately billable procedures and treating other patients. Critical care was necessary to treat or prevent imminent or life-threatening deterioration. Critical care was time spent personally by me on the following activities: development of treatment plan with patient and/or surrogate as well as nursing, discussions with consultants, evaluation of patient's response to treatment, examination of patient, obtaining history from patient or surrogate, ordering and performing treatments and interventions, ordering and review of laboratory studies, ordering and review of radiographic studies, pulse oximetry and  re-evaluation of patient's condition.    Final Clinical Impressions(s) / ED Diagnoses   Final diagnoses:  Acute respiratory failure with hypoxia (HCC)  Acute bronchitis, unspecified organism    ED Discharge Orders    None       Keundra Petrucelli, Jeannett Senior, MD 12/29/17 1610    Glynn Octave, MD 12/29/17 [phone]

## 2017-12-29 NOTE — H&P (Addendum)
History and Physical    [name] ZOX:[id number] DOB: [birth year] DOA: 12/29/2017  PCP: Toma Deiters, MD   Patient coming from: Home  Chief Complaint: Cough  HPI: [name] is a 32 y.o. male with medical history significant for ongoing tobacco abuse over the last 20 years, morbid obesity, gout, and erosive esophagitis, who presents to the emergency department with worsening cough that is productive of greenish-yellow sputum over the last 4 to 5 days.  He states that he has also had some significant chest tightness and wheezing and significant fits of coughing with no hemoptysis or fever noted.  He seems to have had some chills at home.  He continues to smoke approximately half pack a day and has been doing so since the age of 30.  He has been taking some over-the-counter medications including DayQuil with no significant relief.  He denies any leg swelling or pain.   ED Course: Vital signs are noted to be stable.  Laboratory data with no acute abnormalities.  Chest x-ray is clear with no signs of pneumonia.  There is some left lung base atelectasis noted.  EKG with no acute abnormalities.  He has been started on breathing treatments as well as doxycycline and prednisone.  He is also been given a 1 L fluid bolus.  He is able to speak in full sentences and in no acute respiratory distress.  Currently on nasal cannula.  He was ambulated in the ED with significant dyspnea and hypoxemia noted.  Review of Systems: All others reviewed and otherwise negative.  Past Medical History:  Diagnosis Date  . Acute blood loss anemia 09/02/2012   S/p 1 unit rbcs  . Cellulitis   . Erosive esophagitis 09/01/2012   NSAID induced.  Mer[place] Ray stones   . Gastric ulcer with hemorrhage 09/02/2012   s/p bleeding control tx. Per Dr. Jena Gauss  . Gout   . Obesity   . UTI (lower urinary tract infection)     Past Surgical History:  Procedure Laterality Date  . COLONOSCOPY WITH PROPOFOL N/A 07/30/2014   RMR:  Colonoscopy anal canal/internal hemorrhoids, otherwise normal ileocolonoscoopy.   . ESOPHAGOGASTRODUODENOSCOPY Left 09/01/2012   WJX:BJYNWG esophageal erosions consistent with erosive reflux esophagitis/Pre-pyloric benign-appearing gastric ulcer with bleeding stigmata status post bleeding control therapy as described above  . ESOPHAGOGASTRODUODENOSCOPY N/A 12/13/2012   NFA:[id number] hernia. PReviously noted gastric ulcer completely healed.   . ESOPHAGOGASTRODUODENOSCOPY (EGD) WITH PROPOFOL N/A 07/30/2014   Procedure: ESOPHAGOGASTRODUODENOSCOPY (EGD) WITH PROPOFOL;  Surgeon: Corbin Ade, MD;  Location: [facility];  Service: Endoscopy;  Laterality: N/A;  . TONSILLECTOMY       reports that he has been smoking cigarettes. He has a 7.00 pack-year smoking history. His smokeless tobacco use includes snuff. He reports that he has current or past drug history. Drug: Marijuana. He reports that he does not drink alcohol.  Allergies  Allergen Reactions  . Asa [Aspirin] Other (See Comments)    Pt doesn't know reaction.  . Clindamycin/Lincomycin     Gastric ulcer    . Nsaids Other (See Comments)    Causes Ulcers   . Penicillins Other (See Comments)    Pt doesn't know reaction. Has patient had a PCN reaction causing immediate rash, facial/tongue/throat swelling, SOB or lightheadedness with hypotension: No Has patient had a PCN reaction causing severe rash involving mucus membranes or skin necrosis: No Has patient had a PCN reaction that required hospitalization No Has patient had a PCN reaction occurring within the  last 10 years: No If all of the above answers are "NO", then may proceed with Cephalosporin use.   . Sulfa Antibiotics Other (See Comments)    Pt doesn't know reaction.    Family History  Problem Relation Age of Onset  . Heart failure Mother   . Colon cancer Neg Hx     Prior to Admission medications   Medication Sig Start Date End Date Taking? Authorizing Provider  acetaminophen  (TYLENOL) 325 MG tablet Take 2-3 tablets (650-975 mg total) by mouth daily as needed for mild pain or moderate pain. 05/07/15   Mesner, Barbara CowerJason, MD  allopurinol (ZYLOPRIM) 300 MG tablet Take 300 mg by mouth daily.    [provider]  calcium carbonate (TUMS - DOSED IN MG ELEMENTAL CALCIUM) 500 MG chewable tablet Chew 1 tablet by mouth 2 (two) times daily.    [provider]  colchicine 0.6 MG tablet 1.2 mg PO at first sign of flare, then 0.6 mg 1 hr later; not to exceed 1.8 mg in 1-hr period 11/02/15   Azalia Bilisampos, Kevin, MD  cyclobenzaprine (FLEXERIL) 10 MG tablet Take 1 tablet (10 mg total) by mouth 3 (three) times daily as needed. 10/11/15   Triplett, Tammy, PA-C  gabapentin (NEURONTIN) 300 MG capsule Take 300-600 mg by mouth 3 (three) times daily as needed (pain).     [provider]  HYDROcodone-acetaminophen (NORCO/VICODIN) 5-325 MG tablet Take one tab po q 4 hrs prn pain 06/07/17   Triplett, Tammy, PA-C  Miconazole Nitrate 2 % AERO Apply to clean, dry feet twice a day. 06/07/17   Pauline Ausriplett, Tammy, PA-C    Physical Exam: Vitals:   12/29/17 0430 12/29/17 0455 12/29/17 0500 12/29/17 0530  BP: (!) 116/58  (!) 108/58 119/67  Pulse: 82  85 89  Resp:   20 18  Temp:      TempSrc:      SpO2: 91% 95% 96% 96%    Constitutional: NAD, calm, comfortable, morbidly obese Vitals:   12/29/17 0430 12/29/17 0455 12/29/17 0500 12/29/17 0530  BP: (!) 116/58  (!) 108/58 119/67  Pulse: 82  85 89  Resp:   20 18  Temp:      TempSrc:      SpO2: 91% 95% 96% 96%   Eyes: lids and conjunctivae normal ENMT: Mucous membranes are moist.  Neck: normal, supple Respiratory: clear to auscultation bilaterally. Normal respiratory effort. No accessory muscle use.  Nasal cannula 2 L.  Mild wheezing noted bilaterally. Cardiovascular: Regular rate and rhythm, no murmurs. No extremity edema. Abdomen: no tenderness, no distention. Bowel sounds positive.  Musculoskeletal:  No joint deformity upper and  lower extremities.   Skin: no rashes, lesions, ulcers.  Psychiatric: Normal judgment and insight. Alert and oriented x 3. Normal mood.   Labs on Admission: I have personally reviewed following labs and imaging studies  CBC: Recent Labs  Lab 12/29/17 0152  WBC 9.9  NEUTROABS 6.2  HGB 15.2  HCT 45.5  MCV 90.5  PLT 204   Basic Metabolic Panel: Recent Labs  Lab 12/29/17 0152  NA 142  K 3.8  CL 105  CO2 28  GLUCOSE 99  BUN 12  CREATININE 0.81  CALCIUM 9.1   GFR: CrCl cannot be calculated (Unknown ideal weight.). Liver Function Tests: No results for input(s): AST, ALT, ALKPHOS, BILITOT, PROT, ALBUMIN in the last 168 hours. No results for input(s): LIPASE, AMYLASE in the last 168 hours. No results for input(s): AMMONIA in the last 168 hours.  Coagulation Profile: No results for input(s): INR, PROTIME in the last 168 hours. Cardiac Enzymes: Recent Labs  Lab 12/29/17 0152  TROPONINI <0.03   BNP (last 3 results) No results for input(s): PROBNP in the last 8760 hours. HbA1C: No results for input(s): HGBA1C in the last 72 hours. CBG: No results for input(s): GLUCAP in the last 168 hours. Lipid Profile: No results for input(s): CHOL, HDL, LDLCALC, TRIG, CHOLHDL, LDLDIRECT in the last 72 hours. Thyroid Function Tests: No results for input(s): TSH, T4TOTAL, FREET4, T3FREE, THYROIDAB in the last 72 hours. Anemia Panel: No results for input(s): VITAMINB12, FOLATE, FERRITIN, TIBC, IRON, RETICCTPCT in the last 72 hours. Urine analysis:    Component Value Date/Time   COLORURINE YELLOW 10/11/2015 2015   APPEARANCEUR CLEAR 10/11/2015 2015   LABSPEC 1.020 10/11/2015 2015   PHURINE 6.0 10/11/2015 2015   GLUCOSEU NEGATIVE 10/11/2015 2015   HGBUR NEGATIVE 10/11/2015 2015   BILIRUBINUR NEGATIVE 10/11/2015 2015   KETONESUR NEGATIVE 10/11/2015 2015   PROTEINUR NEGATIVE 10/11/2015 2015   UROBILINOGEN 0.2 02/13/2014 0120   NITRITE NEGATIVE 10/11/2015 2015   LEUKOCYTESUR NEGATIVE  10/11/2015 2015    Radiological Exams on Admission: Dg Chest 2 View  Result Date: 12/29/2017 CLINICAL DATA:  Productive cough for several days, chest pain. EXAM: CHEST - 2 VIEW COMPARISON:  CT chest October 11, 2015 FINDINGS: Cardiomediastinal silhouette is normal. No pleural effusions or focal consolidations. LEFT lung base strandy densities. Trachea projects midline and there is no pneumothorax. Soft tissue planes and included osseous structures are non-suspicious. Mild degenerative changes thoracic spine. Large body habitus. IMPRESSION: LEFT lung base atelectasis. Electronically Signed   By: Awilda Metro M.D.   On: 12/29/2017 02:52    EKG: Independently reviewed.  Sinus rhythm 87 bpm.  Assessment/Plan Principal Problem:   Acute hypoxemic respiratory failure (HCC) Active Problems:   Morbid obesity (HCC)   Erosive esophagitis   Gout   Tobacco abuse   Bronchitis    1. Acute hypoxemic respiratory failure secondary to bronchitis and suspected asthma exacerbation.  Will maintain patient on oral doxycycline as well as oral prednisone and observe for improvement.  Add Pulmicort as well as Mucinex and Tessalon Perles.  Incentive spirometry.  Wean oxygen as tolerated.  Continue breathing treatments around-the-clock.  He will require outpatient spirometry for further evaluation of his lung function given extensive tobacco abuse as well as morbid obesity. 2. Tobacco abuse.  Counseled on cessation.  Will maintain on nicotine patch. 3. Gout.  Maintain on home medications. 4. Erosive esophagitis.  Maintain on PPI while on prednisone.   DVT prophylaxis: Lovenox Code Status: Full Family Communication: None at bedside Disposition Plan: Treatment of bronchitis and suspected asthma exacerbation; observation with likely discharge in 24 hours. Consults called: None Admission status: Observation, MedSurg   Erick Blinks DO [facility] Pager [phone]  If 7PM-7AM, please contact  night-coverage [email or web address] Password [facility]  12/29/2017, 6:34 AM

## 2017-12-29 NOTE — ED Notes (Signed)
Pt sat up at bedside O2 89-91%, ambulated pt in hallway, O2 between 91-94% however, pt very winded with labored breathing, had had coughing spell while ambulating

## 2017-12-30 DIAGNOSIS — Z720 Tobacco use: Secondary | ICD-10-CM

## 2017-12-30 DIAGNOSIS — M1A9XX0 Chronic gout, unspecified, without tophus (tophi): Secondary | ICD-10-CM | POA: Diagnosis not present

## 2017-12-30 DIAGNOSIS — J40 Bronchitis, not specified as acute or chronic: Secondary | ICD-10-CM

## 2017-12-30 DIAGNOSIS — J9601 Acute respiratory failure with hypoxia: Secondary | ICD-10-CM | POA: Diagnosis not present

## 2017-12-30 LAB — CBC
HCT: 43.8 % (ref 39.0–52.0)
Hemoglobin: 14.5 g/dL (ref 13.0–17.0)
MCH: 29.9 pg (ref 26.0–34.0)
MCHC: 33.1 g/dL (ref 30.0–36.0)
MCV: 90.3 fL (ref 78.0–100.0)
PLATELETS: 218 10*3/uL (ref 150–400)
RBC: 4.85 MIL/uL (ref 4.22–5.81)
RDW: 14 % (ref 11.5–15.5)
WBC: 10.8 10*3/uL — ABNORMAL HIGH (ref 4.0–10.5)

## 2017-12-30 LAB — BASIC METABOLIC PANEL
Anion gap: 6 (ref 5–15)
BUN: 14 mg/dL (ref 6–20)
CALCIUM: 9 mg/dL (ref 8.9–10.3)
CO2: 27 mmol/L (ref 22–32)
CREATININE: 0.69 mg/dL (ref 0.61–1.24)
Chloride: 108 mmol/L (ref 98–111)
GFR calc non Af Amer: >60 mL/min (ref >60)
GLUCOSE: 140 mg/dL — AB (ref 70–99)
Potassium: 4.3 mmol/L (ref 3.5–5.1)
Sodium: 141 mmol/L (ref 135–145)

## 2017-12-30 LAB — HIV ANTIBODY (ROUTINE TESTING W REFLEX): HIV SCREEN 4TH GENERATION: NONREACTIVE

## 2017-12-30 MED ORDER — GUAIFENESIN ER 600 MG PO TB12: 1200.0000 mg | ORAL_TABLET | Freq: Two times a day (BID) | ORAL | 0 refills | Status: AC

## 2017-12-30 MED ORDER — ALBUTEROL SULFATE HFA 108 (90 BASE) MCG/ACT IN AERS: 2.0000 | INHALATION_SPRAY | RESPIRATORY_TRACT | 1 refills | Status: DC | PRN

## 2017-12-30 MED ORDER — DOXYCYCLINE HYCLATE 100 MG PO TABS: 100.0000 mg | ORAL_TABLET | Freq: Two times a day (BID) | ORAL | 0 refills | Status: AC

## 2017-12-30 MED ORDER — PREDNISONE 20 MG PO TABS: ORAL_TABLET | ORAL | 0 refills | Status: DC

## 2017-12-30 MED ORDER — HYDROCODONE-ACETAMINOPHEN 5-325 MG PO TABS: 1.0000 | ORAL_TABLET | Freq: Four times a day (QID) | ORAL | 0 refills | Status: AC | PRN

## 2017-12-30 MED ORDER — BENZONATATE 200 MG PO CAPS: 200.0000 mg | ORAL_CAPSULE | Freq: Three times a day (TID) | ORAL | 0 refills | Status: AC | PRN

## 2017-12-30 NOTE — Discharge Summary (Addendum)
Physician Discharge Summary  [name] ZOX:[id number] DOB: 1985-07-09 DOA: 12/29/2017  PCP: Toma Deiters, MD  Admit date: 12/29/2017 Discharge date: 12/30/2017  Admitted From: HOME  Disposition: HOME   Discharge Condition: STABLE   CODE STATUS: FULL    Brief Hospitalization Summary: Please see all hospital notes, images, labs for full details of the hospitalization.  HPI: [name] is a 32 y.o. male with medical history significant for ongoing tobacco abuse over the last 20 years, morbid obesity, gout, and erosive esophagitis, who presents to the emergency department with worsening cough that is productive of greenish-yellow sputum over the last 4 to 5 days.  He states that he has also had some significant chest tightness and wheezing and significant fits of coughing with no hemoptysis or fever noted.  He seems to have had some chills at home.  He continues to smoke approximately half pack a day and has been doing so since the age of 21.  He has been taking some over-the-counter medications including DayQuil with no significant relief.  He denies any leg swelling or pain.   ED Course: Vital signs are noted to be stable.  Laboratory data with no acute abnormalities.  Chest x-ray is clear with no signs of pneumonia.  There is some left lung base atelectasis noted.  EKG with no acute abnormalities.  He has been started on breathing treatments as well as doxycycline and prednisone.  He is also been given a 1 L fluid bolus.  He is able to speak in full sentences and in no acute respiratory distress.  Currently on nasal cannula.  He was ambulated in the ED with significant dyspnea and hypoxemia noted.  The patient was admitted with acute bronchitis and presumed acute asthma exacerbation.  He was started on steroids and nebs treatments and oral doxycycline.  He was given mucinex to help loosen the secretions.  He tolerated this well and began to get much better.  His breathing is much  better and he is not using accessory muscles to breath and has been ambulating in room.  He is coughing  Much less  Now.  He no longer requires inpatient treatments and will be discharged home to follow up with his primary care provider.  He was discharged with albuterol inhaler, steroid taper, doxycycline prescription and up to 3 days of vicodin for the severe headaches he claims that he gets when having severe coughing spells.  I asked him to have his blood pressure rechecked with his primary care provider after he has completed all of his treatments to be sure that he does not have undiagnosed hypertension.  He verbalized understanding.  Pt was counseled to stop smoking and given information at discharge to help him quit smoking.  He verbalized understanding.   Discharge Diagnoses:  Principal Problem:   Acute hypoxemic respiratory failure (HCC) Active Problems:   Morbid obesity (HCC)   Erosive esophagitis   Gout   Tobacco abuse   Bronchitis  Discharge Instructions: Discharge Instructions    Call MD for:  difficulty breathing, headache or visual disturbances   Complete by:  As directed    Call MD for:  extreme fatigue   Complete by:  As directed    Call MD for:  hives   Complete by:  As directed    Call MD for:  persistant dizziness or light-headedness   Complete by:  As directed    Call MD for:  persistant nausea and vomiting   Complete  by:  As directed    Call MD for:  severe uncontrolled pain   Complete by:  As directed    Increase activity slowly   Complete by:  As directed      Allergies as of 12/30/2017      Reactions   Asa [aspirin] Other (See Comments)   Pt doesn't know reaction.   Clindamycin/lincomycin    Gastric ulcer    Nsaids Other (See Comments)   Causes Ulcers    Penicillins Other (See Comments)   Pt doesn't know reaction. Has patient had a PCN reaction causing immediate rash, facial/tongue/throat swelling, SOB or lightheadedness with hypotension: No Has  patient had a PCN reaction causing severe rash involving mucus membranes or skin necrosis: No Has patient had a PCN reaction that required hospitalization No Has patient had a PCN reaction occurring within the last 10 years: No If all of the above answers are "NO", then may proceed with Cephalosporin use.   Sulfa Antibiotics Other (See Comments)   Pt doesn't know reaction.      Medication List    TAKE these medications   acetaminophen 325 MG tablet Commonly known as:  TYLENOL Take 2-3 tablets (650-975 mg total) by mouth daily as needed for mild pain or moderate pain.   albuterol 108 (90 Base) MCG/ACT inhaler Commonly known as:  PROVENTIL HFA;VENTOLIN HFA Inhale 2 puffs into the lungs every 4 (four) hours as needed for wheezing or shortness of breath (cough, shortness of breath or wheezing.).   benzonatate 200 MG capsule Commonly known as:  TESSALON Take 1 capsule (200 mg total) by mouth 3 (three) times daily as needed for up to 5 days for cough.   calcium carbonate 500 MG chewable tablet Commonly known as:  TUMS - dosed in mg elemental calcium Chew 1 tablet by mouth 2 (two) times daily.   colchicine 0.6 MG tablet 1.2 mg PO at first sign of flare, then 0.6 mg 1 hr later; not to exceed 1.8 mg in 1-hr period   doxycycline 100 MG tablet Commonly known as:  VIBRA-TABS Take 1 tablet (100 mg total) by mouth every 12 (twelve) hours for 5 days.   gabapentin 300 MG capsule Commonly known as:  NEURONTIN Take 300 mg by mouth 3 (three) times daily as needed (pain).   guaiFENesin 600 MG 12 hr tablet Commonly known as:  MUCINEX Take 2 tablets (1,200 mg total) by mouth 2 (two) times daily for 5 days.   HYDROcodone-acetaminophen 5-325 MG tablet Commonly known as:  NORCO/VICODIN Take 1 tablet by mouth every 6 (six) hours as needed for up to 3 days for severe pain. What changed:    how much to take  how to take this  when to take this  reasons to take this  additional  instructions   predniSONE 20 MG tablet Commonly known as:  DELTASONE Take 3 PO QAM x3days, 2 PO QAM x5days, 1 PO QAM x5days      Follow-up Information    Hasanaj, Myra GianottiXaje A, MD. Schedule an appointment as soon as possible for a visit in 1 week(s).   Specialty:  Internal Medicine Why:  Hospital Follow Up  Contact information: [address] [place] [id number] [phone]          Allergies  Allergen Reactions  . Asa [Aspirin] Other (See Comments)    Pt doesn't know reaction.  . Clindamycin/Lincomycin     Gastric ulcer    . Nsaids Other (See Comments)  Causes Ulcers   . Penicillins Other (See Comments)    Pt doesn't know reaction. Has patient had a PCN reaction causing immediate rash, facial/tongue/throat swelling, SOB or lightheadedness with hypotension: No Has patient had a PCN reaction causing severe rash involving mucus membranes or skin necrosis: No Has patient had a PCN reaction that required hospitalization No Has patient had a PCN reaction occurring within the last 10 years: No If all of the above answers are "NO", then may proceed with Cephalosporin use.   . Sulfa Antibiotics Other (See Comments)    Pt doesn't know reaction.   Allergies as of 12/30/2017      Reactions   Asa [aspirin] Other (See Comments)   Pt doesn't know reaction.   Clindamycin/lincomycin    Gastric ulcer    Nsaids Other (See Comments)   Causes Ulcers    Penicillins Other (See Comments)   Pt doesn't know reaction. Has patient had a PCN reaction causing immediate rash, facial/tongue/throat swelling, SOB or lightheadedness with hypotension: No Has patient had a PCN reaction causing severe rash involving mucus membranes or skin necrosis: No Has patient had a PCN reaction that required hospitalization No Has patient had a PCN reaction occurring within the last 10 years: No If all of the above answers are "NO", then may proceed with Cephalosporin use.   Sulfa Antibiotics Other (See  Comments)   Pt doesn't know reaction.      Medication List    TAKE these medications   acetaminophen 325 MG tablet Commonly known as:  TYLENOL Take 2-3 tablets (650-975 mg total) by mouth daily as needed for mild pain or moderate pain.   albuterol 108 (90 Base) MCG/ACT inhaler Commonly known as:  PROVENTIL HFA;VENTOLIN HFA Inhale 2 puffs into the lungs every 4 (four) hours as needed for wheezing or shortness of breath (cough, shortness of breath or wheezing.).   benzonatate 200 MG capsule Commonly known as:  TESSALON Take 1 capsule (200 mg total) by mouth 3 (three) times daily as needed for up to 5 days for cough.   calcium carbonate 500 MG chewable tablet Commonly known as:  TUMS - dosed in mg elemental calcium Chew 1 tablet by mouth 2 (two) times daily.   colchicine 0.6 MG tablet 1.2 mg PO at first sign of flare, then 0.6 mg 1 hr later; not to exceed 1.8 mg in 1-hr period   doxycycline 100 MG tablet Commonly known as:  VIBRA-TABS Take 1 tablet (100 mg total) by mouth every 12 (twelve) hours for 5 days.   gabapentin 300 MG capsule Commonly known as:  NEURONTIN Take 300 mg by mouth 3 (three) times daily as needed (pain).   guaiFENesin 600 MG 12 hr tablet Commonly known as:  MUCINEX Take 2 tablets (1,200 mg total) by mouth 2 (two) times daily for 5 days.   HYDROcodone-acetaminophen 5-325 MG tablet Commonly known as:  NORCO/VICODIN Take 1 tablet by mouth every 6 (six) hours as needed for up to 3 days for severe pain. What changed:    how much to take  how to take this  when to take this  reasons to take this  additional instructions   predniSONE 20 MG tablet Commonly known as:  DELTASONE Take 3 PO QAM x3days, 2 PO QAM x5days, 1 PO QAM x5days       Procedures/Studies: Dg Chest 2 View  Result Date: 12/29/2017 CLINICAL DATA:  Productive cough for several days, chest pain. EXAM: CHEST - 2 VIEW COMPARISON:  CT chest October 11, 2015 FINDINGS: Cardiomediastinal  silhouette is normal. No pleural effusions or focal consolidations. LEFT lung base strandy densities. Trachea projects midline and there is no pneumothorax. Soft tissue planes and included osseous structures are non-suspicious. Mild degenerative changes thoracic spine. Large body habitus. IMPRESSION: LEFT lung base atelectasis. Electronically Signed   By: Awilda Metro M.D.   On: 12/29/2017 02:52      Subjective: Pt resting comfortably, no specific complaints except headache with coughing.  No chest pain or SOB.  Cough and wheezing is getting better.    Discharge Exam: Vitals:   12/30/17 0521 12/30/17 0803  BP: (!) 147/91   Pulse: 74   Resp: 20   Temp: 98.1 F (36.7 C)   SpO2: 94% 96%   Vitals:   12/29/17 2118 12/30/17 0210 12/30/17 0521 12/30/17 0803  BP: 103/82  (!) 147/91   Pulse: (!) 111  74   Resp: 18  20   Temp: 98.1 F (36.7 C)  98.1 F (36.7 C)   TempSrc: Oral  Oral   SpO2: 96% 97% 94% 96%  Weight:      Height:       General: Pt is alert, awake, not in acute distress, pt speaking in full sentences. He is morbidly obese.  Cardiovascular: RRR, S1/S2 +, no rubs, no gallops Respiratory: BBS fairly clear with much improved expiratory wheezing, no rhonchi, no increased WOB.  Abdominal: Soft, NT, ND, bowel sounds + Extremities: no edema, no cyanosis   The results of significant diagnostics from this hospitalization (including imaging, microbiology, ancillary and laboratory) are listed below for reference.    Microbiology: No results found for this or any previous visit (from the past 240 hour(s)).   Labs: BNP (last 3 results) No results for input(s): BNP in the last 8760 hours. Basic Metabolic Panel: Recent Labs  Lab 12/29/17 0152 12/30/17 0619  NA 142 141  K 3.8 4.3  CL 105 108  CO2 28 27  GLUCOSE 99 140*  BUN 12 14  CREATININE 0.81 0.69  CALCIUM 9.1 9.0   Liver Function Tests: No results for input(s): AST, ALT, ALKPHOS, BILITOT, PROT, ALBUMIN in the  last 168 hours. No results for input(s): LIPASE, AMYLASE in the last 168 hours. No results for input(s): AMMONIA in the last 168 hours. CBC: Recent Labs  Lab 12/29/17 0152 12/30/17 0619  WBC 9.9 10.8*  NEUTROABS 6.2  --   HGB 15.2 14.5  HCT 45.5 43.8  MCV 90.5 90.3  PLT 204 218   Cardiac Enzymes: Recent Labs  Lab 12/29/17 0152  TROPONINI <0.03   BNP: Invalid input(s): POCBNP CBG: No results for input(s): GLUCAP in the last 168 hours. D-Dimer Recent Labs    12/29/17 0152  DDIMER 0.46   Hgb A1c No results for input(s): HGBA1C in the last 72 hours. Lipid Profile No results for input(s): CHOL, HDL, LDLCALC, TRIG, CHOLHDL, LDLDIRECT in the last 72 hours. Thyroid function studies No results for input(s): TSH, T4TOTAL, T3FREE, THYROIDAB in the last 72 hours.  Invalid input(s): FREET3 Anemia work up No results for input(s): VITAMINB12, FOLATE, FERRITIN, TIBC, IRON, RETICCTPCT in the last 72 hours. Urinalysis    Component Value Date/Time   COLORURINE YELLOW 10/11/2015 2015   APPEARANCEUR CLEAR 10/11/2015 2015   LABSPEC 1.020 10/11/2015 2015   PHURINE 6.0 10/11/2015 2015   GLUCOSEU NEGATIVE 10/11/2015 2015   HGBUR NEGATIVE 10/11/2015 2015   BILIRUBINUR NEGATIVE 10/11/2015 2015   KETONESUR NEGATIVE 10/11/2015 2015   PROTEINUR  NEGATIVE 10/11/2015 2015   UROBILINOGEN 0.2 02/13/2014 0120   NITRITE NEGATIVE 10/11/2015 2015   LEUKOCYTESUR NEGATIVE 10/11/2015 2015   Sepsis Labs Invalid input(s): PROCALCITONIN,  WBC,  LACTICIDVEN Microbiology No results found for this or any previous visit (from the past 240 hour(s)).  Time coordinating discharge:   SIGNED:  Standley Dakins, MD  [facility] 12/30/2017, 10:09 AM Pager [phone]  If 7PM-7AM, please contact night-coverage [email or web address] Password [email or web address]

## 2017-12-30 NOTE — Discharge Instructions (Signed)
Acute Bronchitis, Adult Acute bronchitis is when air tubes (bronchi) in the lungs suddenly get swollen. The condition can make it hard to breathe. It can also cause these symptoms:  A cough.  Coughing up clear, yellow, or green mucus.  Wheezing.  Chest congestion.  Shortness of breath.  A fever.  Body aches.  Chills.  A sore throat.  Follow these instructions at home: Medicines  Take over-the-counter and prescription medicines only as told by your doctor.  If you were prescribed an antibiotic medicine, take it as told by your doctor. Do not stop taking the antibiotic even if you start to feel better. General instructions  Rest.  Drink enough fluids to keep your pee (urine) clear or pale yellow.  Avoid smoking and secondhand smoke. If you smoke and you need help quitting, ask your doctor. Quitting will help your lungs heal faster.  Use an inhaler, cool mist vaporizer, or humidifier as told by your doctor.  Keep all follow-up visits as told by your doctor. This is important. How is this prevented? To lower your risk of getting this condition again:  Wash your hands often with soap and water. If you cannot use soap and water, use hand sanitizer.  Avoid contact with people who have cold symptoms.  Try not to touch your hands to your mouth, nose, or eyes.  Make sure to get the flu shot every year.  Contact a doctor if:  Your symptoms do not get better in 2 weeks. Get help right away if:  You cough up blood.  You have chest pain.  You have very bad shortness of breath.  You become dehydrated.  You faint (pass out) or keep feeling like you are going to pass out.  You keep throwing up (vomiting).  You have a very bad headache.  Your fever or chills gets worse. This information is not intended to replace advice given to you by your health care provider. Make sure you discuss any questions you have with your health care provider. Document Released:  09/20/2007 Document Revised: 11/10/2015 Document Reviewed: 09/22/2015 Elsevier Interactive Patient Education  2018 Else[employer] Inc.  Elvera Bicker Been Prescribed an Antibiotic in the Hospital for an Infection You've Been Prescribed an Antibiotic in the Hospital for an Infection Your healthcare team has decided you or your loved one has an infection that requires antibiotics, or needs antibiotics to prevent an infection in certain circumstances, such as before surgery. Antibiotics save lives, and they are critical tools for treating a number of common infections, such as pneumonia, and for life-threatening conditions such as sepsis. They need to be used properly because they can cause side effects and lead to antibiotic resistance. But when antibiotics are needed, the benefits outweigh the risks of side effects or antibiotic resistance. There are some important things you should know about your antibiotic treatment.  Your healthcare team may run tests before you start taking an antibiotic. ? Your team may take samples (from your blood, urine or other areas) to run tests to look for bacteria. These tests can be important to determine if you need an antibiotic at all and, if you do, which antibiotic will work best.  After a few days of treatment, your healthcare team might change, or even stop, your antibiotic. ? While they are working to find out what is making you sick, your team has started you on an antibiotic ? If test results show a different antibiotic would be better to treat your infection, they will change  your antibiotic. ? Your team may review antibiotic therapy 48 to 72 hours after it is started based on your clinical condition and microbiology culture results, and stop or change antibiotic orders as needed--an important step in your care ? In some cases, once your team has more information, they might decide that you do not need an antibiotic at all. They may find out that you dont have an  infection, or that the antibiotic youre taking wont work against your infection. For example, an infection caused by a virus cant be treated with antibiotics. Staying on an antibiotic when you dont need it wont help you and the side effects could still hurt you.  You may experience side effects from your antibiotic. ? Like all medications, antibiotics have side effects. Some of these can be serious. ? Let your healthcare team know if you have any known allergies when you are admitted to the hospital. ? Common side effects of antibiotics can include rash, dizziness, nausea, yeast infections, and diarrhea. ? The most serious side effects include Clostridium difficile infection (also called C. difficile or C. diff) and life-threatening allergic reactions. C. difficile causes diarrhea that can lead to severe colon damage and death ? Diarrhea caused by C. difficile can be serious and must be recognized and treated quickly. When you are taking an antibiotic and you develop diarrhea, let your healthcare team know immediately. ? The risk of getting C. difficile diarrhea can last for up to several months even after you are no longer getting antibiotics. You should let your healthcare team know if you develop diarrhea even after you are no longer getting an antibiotic.  As a patient or caregiver, it is important to understand your or your loved one's antibiotic treatment. It is especially important for caregivers to speak up when patients can't speak for themselves. Here are some important questions to ask your healthcare team. ? What infection is this antibiotic treating and how do you know I have that infection? ? Is the antibiotic being prescribed the most targeted to treat the infection while causing the least side effects? ? What side effects might occur from this antibiotic? ? How long will I need to take this antibiotic? ? Is it safe to take this antibiotic with other medications or supplements  (e.g., vitamins) that I am taking? ? Are there any special directions I need to know about taking this antibiotic? For example, should I take it with food? ? How will I be monitored to know whether my infection is responding to the antibiotic? ? What tests may help to make sure the right antibiotic is prescribed for me?  Remember, antibiotics are life-saving drugs and they need to be used properly. If you have any questions about your antibiotics, please talk to your healthcare team. To learn more about antibiotic prescribing and use, visit [email or web address]-[email or web address]/. CDC - Be Antibiotics Aware: Smart Use, Best Care, 07/2016. This information is not intended to replace advice given to you by your health care provider. Make sure you discuss any questions you have with your health care provider. Document Released: 08/10/2016 Document Revised: 08/10/2016 Document Reviewed: 08/10/2016 Elsevier Interactive Patient Education  2018 [employer].   Follow with Primary MD  Toma Deiters, MD  and other consultant's as instructed your Hospitalist MD  Please get a complete blood count and chemistry panel checked by your Primary MD at your next visit, and again as instructed by your Primary MD.  Get Medicines reviewed and adjusted: Please take  all your medications with you for your next visit with your Primary MD  Laboratory/radiological data: Please request your Primary MD to go over all hospital tests and procedure/radiological results at the follow up, please ask your Primary MD to get all Hospital records sent to his/her office.  In some cases, they will be blood work, cultures and biopsy results pending at the time of your discharge. Please request that your primary care M.D. follows up on these results.  Also Note the following: If you experience worsening of your admission symptoms, develop shortness of breath, life threatening emergency, suicidal or homicidal thoughts you must seek medical  attention immediately by calling 911 or calling your MD immediately  if symptoms less severe.  You must read complete instructions/literature along with all the possible adverse reactions/side effects for all the Medicines you take and that have been prescribed to you. Take any new Medicines after you have completely understood and accpet all the possible adverse reactions/side effects.   Do not drive when taking Pain medications or sleeping medications (Benzodaizepines)  Do not take more than prescribed Pain, Sleep and Anxiety Medications. It is not advisable to combine anxiety,sleep and pain medications without talking with your primary care practitioner  Special Instructions: If you have smoked or chewed Tobacco  in the last 2 yrs please stop smoking, stop any regular Alcohol  and or any Recreational drug use.  Wear Seat belts while driving.  Please note: You were cared for by a hospitalist during your hospital stay. Once you are discharged, your primary care physician will handle any further medical issues. Please note that NO REFILLS for any discharge medications will be authorized once you are discharged, as it is imperative that you return to your primary care physician (or establish a relationship with a primary care physician if you do not have one) for your post hospital discharge needs so that they can reassess your need for medications and monitor your lab values.   If you wish to quit smoking, help is available.  For free tobacco cessation program offerings call the [facility] at [phone] or Live Well Line at [phone]. You may also visit [email or web address] or email [email or web address]  for more information on other programs.   Steps to Quit Smoking Smoking tobacco can be harmful to your health and can affect almost every organ in your body. Smoking puts you, and those around you, at risk for developing many serious chronic diseases. Quitting smoking  is difficult, but it is one of the best things that you can do for your health. It is never too late to quit. What are the benefits of quitting smoking? When you quit smoking, you lower your risk of developing serious diseases and conditions, such as:  Lung cancer or lung disease, such as COPD.  Heart disease.  Stroke.  Heart attack.  Infertility.  Osteoporosis and bone fractures.  Additionally, symptoms such as coughing, wheezing, and shortness of breath may get better when you quit. You may also find that you get sick less often because your body is stronger at fighting off colds and infections. If you are pregnant, quitting smoking can help to reduce your chances of having a baby of low birth weight. How do I get ready to quit? When you decide to quit smoking, create a plan to make sure that you are successful. Before you quit:  Pick a date to quit. Set a date within the next two weeks to give you time  to prepare.  Write down the reasons why you are quitting. Keep this list in places where you will see it often, such as on your bathroom mirror or in your car or wallet.  Identify the people, places, things, and activities that make you want to smoke (triggers) and avoid them. Make sure to take these actions: ? Throw away all cigarettes at home, at work, and in your car. ? Throw away smoking accessories, such as Set designer. ? Clean your car and make sure to empty the ashtray. ? Clean your home, including curtains and carpets.  Tell your family, friends, and coworkers that you are quitting. Support from your loved ones can make quitting easier.  Talk with your health care provider about your options for quitting smoking.  Find out what treatment options are covered by your health insurance.  What strategies can I use to quit smoking? Talk with your healthcare provider about different strategies to quit smoking. Some strategies include:  Quitting smoking altogether  instead of gradually lessening how much you smoke over a period of time. Research shows that quitting cold [place] is more successful than gradually quitting.  Attending in-person counseling to help you build problem-solving skills. You are more likely to have success in quitting if you attend several counseling sessions. Even short sessions of 10 minutes can be effective.  Finding resources and support systems that can help you to quit smoking and remain smoke-free after you quit. These resources are most helpful when you use them often. They can include: ? Online chats with a Veterinary surgeon. ? Telephone quitlines. ? Automotive engineer. ? Support groups or group counseling. ? Text messaging programs. ? Mobile phone applications.  Taking medicines to help you quit smoking. (If you are pregnant or breastfeeding, talk with your health care provider first.) Some medicines contain nicotine and some do not. Both types of medicines help with cravings, but the medicines that include nicotine help to relieve withdrawal symptoms. Your health care provider may recommend: ? Nicotine patches, gum, or lozenges. ? Nicotine inhalers or sprays. ? Non-nicotine medicine that is taken by mouth.  Talk with your health care provider about combining strategies, such as taking medicines while you are also receiving in-person counseling. Using these two strategies together makes you more likely to succeed in quitting than if you used either strategy on its own. If you are pregnant or breastfeeding, talk with your health care provider about finding counseling or other support strategies to quit smoking. Do not take medicine to help you quit smoking unless told to do so by your health care provider. What things can I do to make it easier to quit? Quitting smoking might feel overwhelming at first, but there is a lot that you can do to make it easier. Take these important actions:  Reach out to your family and  friends and ask that they support and encourage you during this time. Call telephone quitlines, reach out to support groups, or work with a counselor for support.  Ask people who smoke to avoid smoking around you.  Avoid places that trigger you to smoke, such as bars, parties, or smoke-break areas at work.  Spend time around people who do not smoke.  Lessen stress in your life, because stress can be a smoking trigger for some people. To lessen stress, try: ? Exercising regularly. ? Deep-breathing exercises. ? Yoga. ? Meditating. ? Performing a body scan. This involves closing your eyes, scanning your body from head to toe,  and noticing which parts of your body are particularly tense. Purposefully relax the muscles in those areas.  Download or purchase mobile phone or tablet apps (applications) that can help you stick to your quit plan by providing reminders, tips, and encouragement. There are many free apps, such as QuitGuide from the [employer] developer(Centers for Disease Control and Prevention). You can find other support for quitting smoking (smoking cessation) through smokefree.gov and other websites.  How will I feel when I quit smoking? Within the first 24 hours of quitting smoking, you may start to feel some withdrawal symptoms. These symptoms are usually most noticeable 2-3 days after quitting, but they usually do not last beyond 2-3 weeks. Changes or symptoms that you might experience include:  Mood swings.  Restlessness, anxiety, or irritation.  Difficulty concentrating.  Dizziness.  Strong cravings for sugary foods in addition to nicotine.  Mild weight gain.  Constipation.  Nausea.  Coughing or a sore throat.  Changes in how your medicines work in your body.  A depressed mood.  Difficulty sleeping (insomnia).  After the first 2-3 weeks of quitting, you may start to notice more positive results, such as:  Improved sense of smell and taste.  Decreased coughing and sore  throat.  Slower heart rate.  Lower blood pressure.  Clearer skin.  The ability to breathe more easily.  Fewer sick days.  Quitting smoking is very challenging for most people. Do not get discouraged if you are not successful the first time. Some people need to make many attempts to quit before they achieve long-term success. Do your best to stick to your quit plan, and talk with your health care provider if you have any questions or concerns. This information is not intended to replace advice given to you by your health care provider. Make sure you discuss any questions you have with your health care provider. Document Released: 03/28/2001 Document Revised: 11/30/2015 Document Reviewed: 08/18/2014 Elsevier Interactive Patient Education  [employer]2018 Elsevier Inc.

## 2017-12-30 NOTE — Progress Notes (Signed)
IV discontinued,catheter intact. Discharge instructions given on medications and follow up visits,patient verbalized understanding. Prescriptions sent to Pharmacy of choice documented on AVS. Accompanied by staff to an awaiting vehicle. 

## 2021-05-30 ENCOUNTER — Other Ambulatory Visit: Payer: Self-pay

## 2021-05-30 ENCOUNTER — Ambulatory Visit (HOSPITAL_COMMUNITY): Payer: Medicaid Other | Attending: Internal Medicine | Admitting: Physical Therapy

## 2021-05-30 DIAGNOSIS — Z789 Other specified health status: Secondary | ICD-10-CM | POA: Diagnosis present

## 2021-05-30 DIAGNOSIS — R262 Difficulty in walking, not elsewhere classified: Secondary | ICD-10-CM | POA: Diagnosis present

## 2021-05-30 DIAGNOSIS — R29898 Other symptoms and signs involving the musculoskeletal system: Secondary | ICD-10-CM | POA: Insufficient documentation

## 2021-05-30 DIAGNOSIS — Z7409 Other reduced mobility: Secondary | ICD-10-CM | POA: Insufficient documentation

## 2021-05-30 NOTE — Therapy (Signed)
[facility] [address] [place], [place], [address] Phone: [phone]   Fax:  [phone]  Physical Therapy Evaluation  Patient Details  Name: Steven CoinsMichael E Raymundo MRN: [id number] Date of Birth: 03/11/1986 Referring Provider (PT): Toma DeitersHasanaj, Xaje A, MD   Encounter Date: 05/30/2021   PT End of Session - 05/30/21 1024     Visit Number 1    Number of Visits 12    Date for PT Re-Evaluation 07/11/21    Authorization Type [place] [employer]    Authorization - Visit Number 1    Authorization - Number of Visits 12    Progress Note Due on Visit 10    PT Start Time 1030    PT Stop Time 1114    PT Time Calculation (min) 44 min    Activity Tolerance Patient limited by pain    Behavior During Therapy [facility] for tasks assessed/performed             Past Medical History:  Diagnosis Date   Acute blood loss anemia 09/02/2012   S/p 1 unit rbcs   Cellulitis    Erosive esophagitis 09/01/2012   NSAID induced.   Gall stones    Gastric ulcer with hemorrhage 09/02/2012   s/p bleeding control tx. Per Dr. Jena Gaussourk   Gout    Obesity    UTI (lower urinary tract infection)     Past Surgical History:  Procedure Laterality Date   COLONOSCOPY WITH PROPOFOL N/A 07/30/2014   RMR: Colonoscopy anal canal/internal hemorrhoids, otherwise normal ileocolonoscoopy.    ESOPHAGOGASTRODUODENOSCOPY Left 09/01/2012   ONG:EXBMWURMR:Distal esophageal erosions consistent with erosive reflux esophagitis/Pre-pyloric benign-appearing gastric ulcer with bleeding stigmata status post bleeding control therapy as described above   ESOPHAGOGASTRODUODENOSCOPY N/A 12/13/2012   XLK:GMWNUURMR:Hiatal hernia. PReviously noted gastric ulcer completely healed.    ESOPHAGOGASTRODUODENOSCOPY (EGD) WITH PROPOFOL N/A 07/30/2014   Procedure: ESOPHAGOGASTRODUODENOSCOPY (EGD) WITH PROPOFOL;  Surgeon: Corbin Adeobert M Rourk, MD;  Location: [facility];  Service: Endoscopy;  Laterality: N/A;   TONSILLECTOMY      There were no  vitals filed for this visit.    Subjective Assessment - 05/30/21 1035     Subjective Patient reports that he has had the issues with gout ever since "they" sent him to rehab. From 2020 to 2022 he has been unable to walk and this is partially due to very advanced gout present in multiple joints. He has had multiple hospitalizations in the past including gastrointestinal surgery. He is able to negotiation the stair at his house because there is solid handrailing on his right side while ascending. He states that anything on the left of him is of no use for support and his LLE is the worse of the two.    Pertinent History Gout and Gastrointestinal hemorrhage    Limitations Sitting;Standing;Walking;House hold activities    How long can you sit comfortably? No amount of time    How long can you stand comfortably? No amount of time    How long can you walk comfortably? No amount of time    Patient Stated Goals Be able to walk consistently with a wheeled walker.    Currently in Pain? Yes    Pain Score 4     Pain Location Knee    Pain Orientation Right;Left    Pain Descriptors / Indicators Aching;Sore    Pain Type Chronic pain    Pain Onset More than a month ago    Pain Frequency Constant    Multiple  Pain Sites Yes    Pain Score 7    Pain Location Ankle    Pain Orientation Right;Left    Pain Descriptors / Indicators Sharp    Pain Type Chronic pain    Pain Onset More than a month ago    Pain Frequency Constant                [facility] PT Assessment - 05/30/21 0001       Assessment   Medical Diagnosis Bilateral LE Weakness    Referring Provider (PT) Toma Deiters, MD    Onset Date/Surgical Date 05/30/18    Prior Therapy Home health      Precautions   Precautions Fall      Balance Screen   Has the patient fallen in the past 6 months Yes    How many times? 10    Has the patient had a decrease in activity level because of a fear of falling?  No    Is the patient reluctant to leave  their home because of a fear of falling?  No      Home Nurse, mental health Private residence    Living Arrangements Non-relatives/Friends    Available Help at Discharge Friend(s)    Type of Home House    Home Access Stairs to enter    Entrance Stairs-Number of Steps 7    Entrance Stairs-Rails Right    Home Layout One level    Home Equipment Wheelchair - manual      Prior Function   Level of Independence Independent with basic ADLs      Cognition   Overall Cognitive Status Within Functional Limits for tasks assessed      Sensation   Light Touch --   Denies numbness but does get occasional tingling if he does not adjust in the wheelchair.     ROM / Strength   AROM / PROM / Strength Strength      Strength   Strength Assessment Site Hip;Knee;Ankle    Right/Left Hip Right;Left    Right Hip Flexion 4+/5    Right Hip ABduction 5/5    Left Hip Flexion 5/5    Left Hip ABduction 5/5    Left Hip ADduction 5/5    Right/Left Knee Right;Left    Right Knee Flexion 5/5    Right Knee Extension 4+/5    Left Knee Flexion 4+/5    Left Knee Extension 5/5    Right/Left Ankle Right;Left    Right Ankle Dorsiflexion 4+/5    Right Ankle Plantar Flexion 4+/5    Left Ankle Dorsiflexion 4/5    Left Ankle Plantar Flexion 4+/5      Transfers   Transfers Sit to Stand    Sit to Stand 6: Modified independent (Device/Increase time)      Ambulation/Gait   Ambulation/Gait Yes    Ambulation/Gait Assistance 6: Modified independent (Device/Increase time)    Ambulation Distance (Feet) 5 Feet    Assistive device Rolling walker    Gait Pattern Step-to pattern;Poor foot clearance - left;Poor foot clearance - right    Ambulation Surface Level                        Objective measurements completed on examination: See above findings.                PT Education - 05/30/21 1241     Education Details HEP and POC    Person(s)  Educated Patient    Methods  Explanation;Demonstration;Handout    Comprehension Verbalized understanding              PT Short Term Goals - 05/30/21 1248       PT SHORT TERM GOAL #1   Title Patient will be able to stand with AD for at least 1 minute before requiring a seated rest break.    Time 3    Period Weeks    Status New    Target Date 06/20/21      PT SHORT TERM GOAL #2   Title Patient will be able negotiate a flight of 4 steps with MI and supervision.    Time 3    Period Weeks    Status New    Target Date 06/20/21               PT Long Term Goals - 05/30/21 1249       PT LONG TERM GOAL #1   Title Patient will be able to stand with AD for at least 3 minutes before requiring a seated rest break.    Time 6    Period Weeks    Status New    Target Date 07/11/21      PT LONG TERM GOAL #2   Title Patient will be able negotiate a flight of 4 steps with MI and supervision.    Time 6    Period Weeks    Status New    Target Date 07/11/21      PT LONG TERM GOAL #3   Title Patient will be able to ambulate at least 141ft with AD demonstrating improved(perfection is not assumed with patient's condition) gait mechanics.    Time 6    Period Weeks    Status New    Target Date 07/11/21      PT LONG TERM GOAL #4   Title Patient will be able to complete 1 STS transfer without UE use.    Time 6    Period Weeks    Status New    Target Date 07/11/21                    Plan - 05/30/21 1023     Clinical Impression Statement Patient is a 36 y.o. male presenting to physical therapy with c/o decreased funciton of the BLE. He presents with pain limited deficits in BLE strength, ROM, endurance,  and functional mobility with ADL. He is having to modify and restrict ADL as indicated by subjective information and objective measures which is affecting overall participation. Patient will benefit from skilled physical therapy in order to improve function and reduce impairment.    Personal Factors  and Comorbidities Comorbidity 1;Fitness    Comorbidities Gout    Examination-Activity Limitations Bathing;Bed Mobility;Bend;Caring for Others;Carry;Dressing;Hygiene/Grooming;Lift;Toileting;Stand;Stairs;Squat;Sleep;Sit;Locomotion Level;Transfers    Examination-Participation Restrictions Psychologist, forensic;Interpersonal Relationship;Laundry;Yard Work;Shop;Driving    Stability/Clinical Decision Making Stable/Uncomplicated    Clinical Decision Making Low    Rehab Potential Fair    PT Frequency 2x / week    PT Duration 6 weeks    PT Treatment/Interventions ADLs/Self Care Home Management;Aquatic Therapy;Psychologist, educational;Neuromuscular re-education;Balance training;Therapeutic exercise;Therapeutic activities;Stair training;Functional mobility training;Gait training;Patient/family education;Manual techniques;Passive range of motion;Energy conservation;Joint Manipulations    PT Next Visit Plan Continue with BLE joint mobility exercises and coach on stair negotiation per patient tolerance. Try to focus on improving endurance as well. Assess bilateral ankle mobility assuming the patient is not wearing the same boots as during  the time of the initial evaluation.    PT Home Exercise Plan Sidelying Hip Abduction - 1-2 x daily - 7 x weekly - 3 sets - 8 reps  Active Straight Leg Raise with Quad Set - 1-2 x daily - 7 x weekly - 2 sets - 8 reps  Seated Knee Extension AROM - 1-2 x daily - 7 x weekly - 3 sets - 14 reps  Seated Hamstring Curl with Anchored Resistance - 1-2 x daily - 7 x weekly - 3 sets - 16 reps  Seated Ankle Alphabet - 2-3 x daily - 7 x weekly - 1 reps    Consulted and Agree with Plan of Care Patient             Patient will benefit from skilled therapeutic intervention in order to improve the following deficits and impairments:  Abnormal gait, Difficulty walking, Decreased range of motion, Decreased endurance, Obesity, Pain, Hypomobility, Decreased activity tolerance,  Decreased mobility, Decreased strength  Visit Diagnosis: Bilateral leg weakness  Difficulty in walking, not elsewhere classified  Impaired mobility and ADLs  Impaired functional mobility and endurance     Problem List Patient Active Problem List   Diagnosis Date Noted   Gout 12/29/2017   Tobacco abuse 12/29/2017   Acute hypoxemic respiratory failure (HCC) 12/29/2017   Bronchitis 12/29/2017   Rectal bleed 07/30/2014   Hiatal hernia    Hemorrhoid    Acute lower GI bleeding 07/29/2014   Abdominal pain    Wrist pain, acute    Tenosynovitis of finger, hand, or wrist    Cellulitis of hand 03/10/2014   Right upper quadrant abdominal pain 02/10/2014   Cholelithiasis 02/10/2014   Polyarthritis 11/08/2012   UTI (lower urinary tract infection) 10/21/2012   Cellulitis 09/17/2012   Tinea pedis 09/17/2012   Erosive esophagitis 09/07/2012   Hypokalemia 09/05/2012   Gastric ulcer with hemorrhage 09/02/2012   Upper GI bleed 09/02/2012   Acute blood loss anemia 09/02/2012   Hematemesis 09/01/2012   Normocytic anemia 09/01/2012   Acute gouty arthritis 09/01/2012   Morbid obesity (HCC) 09/01/2012    Creig Hines, PT 05/30/2021, 12:57 PM  [facility] [address] [place], [place], [address] Phone: [phone]   Fax:  [phone]  Name: DOMANIQUE LUCKETT MRN: [id number] Date of Birth: 1985-12-16

## 2021-05-30 NOTE — Patient Instructions (Signed)
Access Code: [email or web address] URL: [email or web address] Date: 05/30/2021 Prepared by: Adalberto Cole  Exercises Sidelying Hip Abduction - 1-2 x daily - 7 x weekly - 3 sets - 8 reps Active Straight Leg Raise with Quad Set - 1-2 x daily - 7 x weekly - 2 sets - 8 reps Seated Knee Extension AROM - 1-2 x daily - 7 x weekly - 3 sets - 14 reps Seated Hamstring Curl with Anchored Resistance - 1-2 x daily - 7 x weekly - 3 sets - 16 reps Seated Ankle Alphabet - 2-3 x daily - 7 x weekly - 1 reps

## 2021-06-14 ENCOUNTER — Other Ambulatory Visit: Payer: Self-pay

## 2021-06-14 ENCOUNTER — Ambulatory Visit (HOSPITAL_COMMUNITY): Payer: Medicaid Other

## 2021-06-14 ENCOUNTER — Encounter (HOSPITAL_COMMUNITY): Payer: Self-pay

## 2021-06-14 DIAGNOSIS — R29898 Other symptoms and signs involving the musculoskeletal system: Secondary | ICD-10-CM

## 2021-06-14 DIAGNOSIS — Z789 Other specified health status: Secondary | ICD-10-CM

## 2021-06-14 DIAGNOSIS — Z7409 Other reduced mobility: Secondary | ICD-10-CM

## 2021-06-14 DIAGNOSIS — R262 Difficulty in walking, not elsewhere classified: Secondary | ICD-10-CM

## 2021-06-14 NOTE — Therapy (Signed)
[facility] [address] [place], [place], [address] Phone: [phone]   Fax:  [phone]  Physical Therapy Treatment  Patient Details  Name: [name] MRN: [id number] Date of Birth: July 22, 1985 Referring Provider (PT): Toma Deiters, MD   Encounter Date: 06/14/2021   PT End of Session - 06/14/21 1215     Visit Number 2    Number of Visits 12    Date for PT Re-Evaluation 07/11/21    Authorization Type [place] [employer]    Authorization - Visit Number 2    Authorization - Number of Visits 12    Progress Note Due on Visit 10    PT Start Time 1135    PT Stop Time 1215    PT Time Calculation (min) 40 min    Activity Tolerance Patient limited by pain;Patient tolerated treatment well    Behavior During Therapy [facility] for tasks assessed/performed             Past Medical History:  Diagnosis Date   Acute blood loss anemia 09/02/2012   S/p 1 unit rbcs   Cellulitis    Erosive esophagitis 09/01/2012   NSAID induced.   Gall stones    Gastric ulcer with hemorrhage 09/02/2012   s/p bleeding control tx. Per Dr. Jena Gauss   Gout    Obesity    UTI (lower urinary tract infection)     Past Surgical History:  Procedure Laterality Date   COLONOSCOPY WITH PROPOFOL N/A 07/30/2014   RMR: Colonoscopy anal canal/internal hemorrhoids, otherwise normal ileocolonoscoopy.    ESOPHAGOGASTRODUODENOSCOPY Left 09/01/2012   TDD:UKGURK esophageal erosions consistent with erosive reflux esophagitis/Pre-pyloric benign-appearing gastric ulcer with bleeding stigmata status post bleeding control therapy as described above   ESOPHAGOGASTRODUODENOSCOPY N/A 12/13/2012   YHC:WCBJSE hernia. PReviously noted gastric ulcer completely healed.    ESOPHAGOGASTRODUODENOSCOPY (EGD) WITH PROPOFOL N/A 07/30/2014   Procedure: ESOPHAGOGASTRODUODENOSCOPY (EGD) WITH PROPOFOL;  Surgeon: Corbin Ade, MD;  Location: [facility];  Service: Endoscopy;  Laterality: N/A;    TONSILLECTOMY      There were no vitals filed for this visit.   Subjective Assessment - 06/14/21 1153     Subjective Reports general soreness in ankle and hands, pain scale 4/10.  Reports he has began the HEP wihtout question.    Pertinent History Gout and Gastrointestinal hemorrhage    Patient Stated Goals Be able to walk consistently with a wheeled walker.    Currently in Pain? Yes    Pain Score 4     Pain Location Generalized    Pain Orientation Right;Left    Pain Descriptors / Indicators Aching;Sore    Pain Type Chronic pain    Pain Onset More than a month ago    Pain Frequency Constant                [facility] PT Assessment - 06/14/21 0001       Assessment   Medical Diagnosis Bilateral LE Weakness    Referring Provider (PT) Toma Deiters, MD    Onset Date/Surgical Date 05/30/18    Prior Therapy Home health      Precautions   Precautions Fall      ROM / Strength   AROM / PROM / Strength AROM      AROM   Overall AROM  Deficits    AROM Assessment Site Ankle    Right/Left Ankle Right;Left    Right Ankle Dorsiflexion -30    Right Ankle Inversion 2  Right Ankle Eversion 2    Left Ankle Dorsiflexion -28    Left Ankle Inversion 0    Left Ankle Eversion 0                           [facility] Adult PT Treatment/Exercise - 06/14/21 0001       Exercises   Exercises Knee/Hip      Knee/Hip Exercises: Standing   Hip Flexion Both    Hip Flexion Limitations alternating marching    Hip Abduction 10 reps    Hip Extension 10 reps    Forward Step Up Both;5 sets;Hand Hold: 2;Step Height: 6";Step Height: 4"    Functional Squat 10 reps    Functional Squat Limitations front of chair    Other Standing Knee Exercises standing more NBOS without HHA for 41" prior fatigue      Knee/Hip Exercises: Seated   Sit to Sand 10 reps;without UE support   WBOS for balance                    PT Education - 06/14/21 1255     Education Details Reviewed  goals, educated importance of HEP compliance for maximal benefits, pt reports he has began exercises wihtout question    Person(s) Educated Patient    Methods Explanation    Comprehension Verbalized understanding              PT Short Term Goals - 05/30/21 1248       PT SHORT TERM GOAL #1   Title Patient will be able to stand with AD for at least 1 minute before requiring a seated rest break.    Time 3    Period Weeks    Status New    Target Date 06/20/21      PT SHORT TERM GOAL #2   Title Patient will be able negotiate a flight of 4 steps with MI and supervision.    Time 3    Period Weeks    Status New    Target Date 06/20/21               PT Long Term Goals - 05/30/21 1249       PT LONG TERM GOAL #1   Title Patient will be able to stand with AD for at least 3 minutes before requiring a seated rest break.    Time 6    Period Weeks    Status New    Target Date 07/11/21      PT LONG TERM GOAL #2   Title Patient will be able negotiate a flight of 4 steps with MI and supervision.    Time 6    Period Weeks    Status New    Target Date 07/11/21      PT LONG TERM GOAL #3   Title Patient will be able to ambulate at least 11ft with AD demonstrating improved(perfection is not assumed with patient's condition) gait mechanics.    Time 6    Period Weeks    Status New    Target Date 07/11/21      PT LONG TERM GOAL #4   Title Patient will be able to complete 1 STS transfer without UE use.    Time 6    Period Weeks    Status New    Target Date 07/11/21  Plan - 06/14/21 1259     Clinical Impression Statement Reviewed goals, educated importance of HEP compliance for maximal benefits, pt stated he has began without question.  Assessed ankle mobility with significant mobility deficits, Rt at 30 degrees and Lt 28 degrees away from neutral, minimal inversion/eversion.  Session focus with functional strengthening and balance training.  Pt  with tendency to complete standing position with WBOS, increased challenge and need for HHA wiht more narrow BOS.  No reports of increased pain through session, was limited by fatiuge required intermittent seated rest breaks.    Personal Factors and Comorbidities Comorbidity 1;Fitness    Comorbidities Gout    Examination-Activity Limitations Bathing;Bed Mobility;Bend;Caring for Others;Carry;Dressing;Hygiene/Grooming;Lift;Toileting;Stand;Stairs;Squat;Sleep;Sit;Locomotion Level;Transfers    Examination-Participation Restrictions Psychologist, forensic;Interpersonal Relationship;Laundry;Yard Work;Shop;Driving    Stability/Clinical Decision Making Stable/Uncomplicated    Clinical Decision Making Low    Rehab Potential Fair    PT Frequency 2x / week    PT Duration 6 weeks    PT Treatment/Interventions ADLs/Self Care Home Management;Aquatic Therapy;Psychologist, educational;Neuromuscular re-education;Balance training;Therapeutic exercise;Therapeutic activities;Stair training;Functional mobility training;Gait training;Patient/family education;Manual techniques;Passive range of motion;Energy conservation;Joint Manipulations    PT Next Visit Plan Continue with BLE joint mobility exercises and coach on stair negotiation per patient tolerance. Try to focus on improving endurance as well.    PT Home Exercise Plan Sidelying Hip Abduction - 1-2 x daily - 7 x weekly - 3 sets - 8 reps  Active Straight Leg Raise with Quad Set - 1-2 x daily - 7 x weekly - 2 sets - 8 reps  Seated Knee Extension AROM - 1-2 x daily - 7 x weekly - 3 sets - 14 reps  Seated Hamstring Curl with Anchored Resistance - 1-2 x daily - 7 x weekly - 3 sets - 16 reps  Seated Ankle Alphabet - 2-3 x daily - 7 x weekly - 1 reps; 2/28: standing balalnce NBOS    Consulted and Agree with Plan of Care Patient             Patient will benefit from skilled therapeutic intervention in order to improve the following deficits and  impairments:  Abnormal gait, Difficulty walking, Decreased range of motion, Decreased endurance, Obesity, Pain, Hypomobility, Decreased activity tolerance, Decreased mobility, Decreased strength  Visit Diagnosis: Bilateral leg weakness  Difficulty in walking, not elsewhere classified  Impaired mobility and ADLs     Problem List Patient Active Problem List   Diagnosis Date Noted   Gout 12/29/2017   Tobacco abuse 12/29/2017   Acute hypoxemic respiratory failure (HCC) 12/29/2017   Bronchitis 12/29/2017   Rectal bleed 07/30/2014   Hiatal hernia    Hemorrhoid    Acute lower GI bleeding 07/29/2014   Abdominal pain    Wrist pain, acute    Tenosynovitis of finger, hand, or wrist    Cellulitis of hand 03/10/2014   Right upper quadrant abdominal pain 02/10/2014   Cholelithiasis 02/10/2014   Polyarthritis 11/08/2012   UTI (lower urinary tract infection) 10/21/2012   Cellulitis 09/17/2012   Tinea pedis 09/17/2012   Erosive esophagitis 09/07/2012   Hypokalemia 09/05/2012   Gastric ulcer with hemorrhage 09/02/2012   Upper GI bleed 09/02/2012   Acute blood loss anemia 09/02/2012   Hematemesis 09/01/2012   Normocytic anemia 09/01/2012   Acute gouty arthritis 09/01/2012   Morbid obesity (HCC) 09/01/2012   Becky Sax, LPTA/CLT; CBIS [phone]  Juel Burrow, PTA 06/14/2021, 1:06 PM  [facility] [address] [place], [place], [address]  Phone: [phone]   Fax:  [phone]  Name: [name] MRN: [id number] Date of Birth: January 20, 1986

## 2021-06-16 ENCOUNTER — Ambulatory Visit (HOSPITAL_COMMUNITY): Payer: Medicaid Other | Attending: Internal Medicine | Admitting: Physical Therapy

## 2021-06-16 ENCOUNTER — Other Ambulatory Visit: Payer: Self-pay

## 2021-06-16 DIAGNOSIS — R29898 Other symptoms and signs involving the musculoskeletal system: Secondary | ICD-10-CM | POA: Diagnosis present

## 2021-06-16 DIAGNOSIS — R262 Difficulty in walking, not elsewhere classified: Secondary | ICD-10-CM | POA: Diagnosis present

## 2021-06-16 DIAGNOSIS — Z7409 Other reduced mobility: Secondary | ICD-10-CM | POA: Insufficient documentation

## 2021-06-16 DIAGNOSIS — Z789 Other specified health status: Secondary | ICD-10-CM | POA: Diagnosis present

## 2021-06-16 NOTE — Therapy (Signed)
[facility] [address] [place], [place], [address] Phone: [phone]   Fax:  [phone]  Physical Therapy Treatment  Patient Details  Name: [name] MRN: [id number] Date of Birth: 10-18-1985 Referring Provider (PT): Toma Deiters, MD   Encounter Date: 06/16/2021   PT End of Session - 06/16/21 1058     Visit Number 3    Number of Visits 12    Date for PT Re-Evaluation 07/11/21    Authorization Type [place] [employer]    Authorization - Visit Number 3    Authorization - Number of Visits 12    Progress Note Due on Visit 10    PT Start Time 1000    PT Stop Time 1048    PT Time Calculation (min) 48 min    Activity Tolerance Patient limited by pain;Patient tolerated treatment well    Behavior During Therapy [facility] for tasks assessed/performed             Past Medical History:  Diagnosis Date   Acute blood loss anemia 09/02/2012   S/p 1 unit rbcs   Cellulitis    Erosive esophagitis 09/01/2012   NSAID induced.   Gall stones    Gastric ulcer with hemorrhage 09/02/2012   s/p bleeding control tx. Per Dr. Jena Gauss   Gout    Obesity    UTI (lower urinary tract infection)     Past Surgical History:  Procedure Laterality Date   COLONOSCOPY WITH PROPOFOL N/A 07/30/2014   RMR: Colonoscopy anal canal/internal hemorrhoids, otherwise normal ileocolonoscoopy.    ESOPHAGOGASTRODUODENOSCOPY Left 09/01/2012   SKA:JGOTLX esophageal erosions consistent with erosive reflux esophagitis/Pre-pyloric benign-appearing gastric ulcer with bleeding stigmata status post bleeding control therapy as described above   ESOPHAGOGASTRODUODENOSCOPY N/A 12/13/2012   BWI:OMBTDH hernia. PReviously noted gastric ulcer completely healed.    ESOPHAGOGASTRODUODENOSCOPY (EGD) WITH PROPOFOL N/A 07/30/2014   Procedure: ESOPHAGOGASTRODUODENOSCOPY (EGD) WITH PROPOFOL;  Surgeon: Corbin Ade, MD;  Location: [facility];  Service: Endoscopy;  Laterality: N/A;    TONSILLECTOMY      There were no vitals filed for this visit.   Subjective Assessment - 06/16/21 1004     Subjective PT states his Rt foot is more swollen today, increased yesterday and has had in creased pain in it at 8/10.  Noted edema in bil LE and UE, Rt LE and Lt UE more swollen than the other limbs.    Currently in Pain? Yes    Pain Score 8     Pain Location Foot    Pain Orientation Right    Pain Descriptors / Indicators Aching;Sore;Tender;Stabbing                               [facility] Adult PT Treatment/Exercise - 06/16/21 0001       Knee/Hip Exercises: Stretches   Other Knee/Hip Stretches gastroc strettch using 4" step 20" X 2 each (unable to use slant boards due to knee discomfort)      Knee/Hip Exercises: Standing   Hip Flexion Both;2 sets;10 reps    Hip Flexion Limitations alternating marching    Forward Lunges Both;10 reps;Limitations    Forward Lunges Limitations onto 4" step with minimal UE assist    Hip Abduction 10 reps;2 sets;Both    Hip Extension 10 reps;2 sets;Both    Lateral Step Up Both;10 reps;Hand Hold: 2;Step Height: 4"    Forward Step Up Both;10 reps;Hand Hold: 2;Step Height:  4";2 sets    Other Standing Knee Exercises side stepping and forward gait 2RT each with minimal UE asssit      Knee/Hip Exercises: Seated   Sit to Sand 10 reps;without UE support                       PT Short Term Goals - 05/30/21 1248       PT SHORT TERM GOAL #1   Title Patient will be able to stand with AD for at least 1 minute before requiring a seated rest break.    Time 3    Period Weeks    Status New    Target Date 06/20/21      PT SHORT TERM GOAL #2   Title Patient will be able negotiate a flight of 4 steps with MI and supervision.    Time 3    Period Weeks    Status New    Target Date 06/20/21               PT Long Term Goals - 05/30/21 1249       PT LONG TERM GOAL #1   Title Patient will be able to stand with AD  for at least 3 minutes before requiring a seated rest break.    Time 6    Period Weeks    Status New    Target Date 07/11/21      PT LONG TERM GOAL #2   Title Patient will be able negotiate a flight of 4 steps with MI and supervision.    Time 6    Period Weeks    Status New    Target Date 07/11/21      PT LONG TERM GOAL #3   Title Patient will be able to ambulate at least 162ft with AD demonstrating improved(perfection is not assumed with patient's condition) gait mechanics.    Time 6    Period Weeks    Status New    Target Date 07/11/21      PT LONG TERM GOAL #4   Title Patient will be able to complete 1 STS transfer without UE use.    Time 6    Period Weeks    Status New    Target Date 07/11/21                   Plan - 06/16/21 1143     Clinical Impression Statement Pt arrives to therapy in wheelchair.  Pt with increased Rt foot pain and difficulty weight bearing through Lt hand making standing activity more challenging this session. Noted dyspnea with minimal exertion needing frequent rest breaks.  Attempted slant board stretch using 2 boards due to wide BOS, however pt too restricted in knees/ankles to achieve stretch in calf.  Pt was able to get a good stretch using a strep pushing heel towards ground.  Worked on standing tolerance and mobility in parallel bars with ability to achieve using 1 UE assist, limited heel contact due to contractures, ER of bilateral LE and wide BOS.   Educated on how compression would help his LE edema and seeking dermatologist due to psoriasis and inability to find a moisturizer he can tolerate.  Educated that skin needs moisture and his condition will worsen if he fails to moisturize and contain his edema.  Pt verbalized understanding.  Pt will continue to benefit from skilled PT to increase strength and overall functional mobility.    Personal Factors and  Comorbidities Comorbidity 1;Fitness    Comorbidities Gout    Examination-Activity  Limitations Bathing;Bed Mobility;Bend;Caring for Others;Carry;Dressing;Hygiene/Grooming;Lift;Toileting;Stand;Stairs;Squat;Sleep;Sit;Locomotion Level;Transfers    Examination-Participation Restrictions Psychologist, forensic;Interpersonal Relationship;Laundry;Yard Work;Shop;Driving    Stability/Clinical Decision Making Stable/Uncomplicated    Rehab Potential Fair    PT Frequency 2x / week    PT Duration 6 weeks    PT Treatment/Interventions ADLs/Self Care Home Management;Aquatic Therapy;Psychologist, educational;Neuromuscular re-education;Balance training;Therapeutic exercise;Therapeutic activities;Stair training;Functional mobility training;Gait training;Patient/family education;Manual techniques;Passive range of motion;Energy conservation;Joint Manipulations    PT Next Visit Plan Continue with BLE joint mobility exercises and coach on stair negotiation per patient tolerance. Try to focus on improving endurance as well.    PT Home Exercise Plan Sidelying Hip Abduction - 1-2 x daily - 7 x weekly - 3 sets - 8 reps  Active Straight Leg Raise with Quad Set - 1-2 x daily - 7 x weekly - 2 sets - 8 reps  Seated Knee Extension AROM - 1-2 x daily - 7 x weekly - 3 sets - 14 reps  Seated Hamstring Curl with Anchored Resistance - 1-2 x daily - 7 x weekly - 3 sets - 16 reps  Seated Ankle Alphabet - 2-3 x daily - 7 x weekly - 1 reps; 2/28: standing balalnce NBOS    Consulted and Agree with Plan of Care Patient             Patient will benefit from skilled therapeutic intervention in order to improve the following deficits and impairments:  Abnormal gait, Difficulty walking, Decreased range of motion, Decreased endurance, Obesity, Pain, Hypomobility, Decreased activity tolerance, Decreased mobility, Decreased strength  Visit Diagnosis: Bilateral leg weakness  Difficulty in walking, not elsewhere classified  Impaired mobility and ADLs  Impaired functional mobility and  endurance     Problem List Patient Active Problem List   Diagnosis Date Noted   Gout 12/29/2017   Tobacco abuse 12/29/2017   Acute hypoxemic respiratory failure (HCC) 12/29/2017   Bronchitis 12/29/2017   Rectal bleed 07/30/2014   Hiatal hernia    Hemorrhoid    Acute lower GI bleeding 07/29/2014   Abdominal pain    Wrist pain, acute    Tenosynovitis of finger, hand, or wrist    Cellulitis of hand 03/10/2014   Right upper quadrant abdominal pain 02/10/2014   Cholelithiasis 02/10/2014   Polyarthritis 11/08/2012   UTI (lower urinary tract infection) 10/21/2012   Cellulitis 09/17/2012   Tinea pedis 09/17/2012   Erosive esophagitis 09/07/2012   Hypokalemia 09/05/2012   Gastric ulcer with hemorrhage 09/02/2012   Upper GI bleed 09/02/2012   Acute blood loss anemia 09/02/2012   Hematemesis 09/01/2012   Normocytic anemia 09/01/2012   Acute gouty arthritis 09/01/2012   Morbid obesity (HCC) 09/01/2012   [name], PTA/CLT, WTA [phone]  [name], PTA 06/16/2021, 11:44 AM  [facility] [address] [place], [place], [address] Phone: [phone]   Fax:  [phone]  Name: [name] MRN: [id number] Date of Birth: June 21, 1985

## 2021-06-21 ENCOUNTER — Encounter (HOSPITAL_COMMUNITY): Payer: Medicaid Other | Admitting: Physical Therapy

## 2021-06-23 ENCOUNTER — Encounter (HOSPITAL_COMMUNITY): Payer: Self-pay

## 2021-06-23 ENCOUNTER — Ambulatory Visit (HOSPITAL_COMMUNITY): Payer: Medicaid Other

## 2021-06-23 ENCOUNTER — Other Ambulatory Visit: Payer: Self-pay

## 2021-06-23 DIAGNOSIS — R262 Difficulty in walking, not elsewhere classified: Secondary | ICD-10-CM

## 2021-06-23 DIAGNOSIS — Z7409 Other reduced mobility: Secondary | ICD-10-CM

## 2021-06-23 DIAGNOSIS — R29898 Other symptoms and signs involving the musculoskeletal system: Secondary | ICD-10-CM

## 2021-06-23 NOTE — Therapy (Signed)
?OUTPATIENT PHYSICAL THERAPY TREATMENT NOTE ? ? ?Patient Name: [name] ?MRN: [id number] ?DOB:16-Jul-1985, 36 y.o., male ?Today's Date: 06/23/2021 ? ?PCP: Toma Deiters, MD ?REFERRING PROVIDER: Toma Deiters, MD ? ? PT End of Session - 06/23/21 1216   ? ? Visit Number 4   ? Number of Visits 12   ? Date for PT Re-Evaluation 07/11/21   ? Authorization Type [place] [employer]   ? Authorization - Visit Number 4   ? Authorization - Number of Visits 12   ? Progress Note Due on Visit 10   ? PT Start Time 1135   ? PT Stop Time 1213   ? PT Time Calculation (min) 38 min   ? Activity Tolerance Patient limited by pain;Patient tolerated treatment well   ? Behavior During Therapy [facility] for tasks assessed/performed   ? ?  ?  ? ?  ? ? ?Past Medical History:  ?Diagnosis Date  ? Acute blood loss anemia 09/02/2012  ? S/p 1 unit rbcs  ? Cellulitis   ? Erosive esophagitis 09/01/2012  ? NSAID induced.  ? Gall stones   ? Gastric ulcer with hemorrhage 09/02/2012  ? s/p bleeding control tx. Per Dr. Jena Gauss  ? Gout   ? Obesity   ? UTI (lower urinary tract infection)   ? ?Past Surgical History:  ?Procedure Laterality Date  ? COLONOSCOPY WITH PROPOFOL N/A 07/30/2014  ? RMR: Colonoscopy anal canal/internal hemorrhoids, otherwise normal ileocolonoscoopy.   ? ESOPHAGOGASTRODUODENOSCOPY Left 09/01/2012  ? FIE:PPIRJJ esophageal erosions consistent with erosive reflux esophagitis/Pre-pyloric benign-appearing gastric ulcer with bleeding stigmata status post bleeding control therapy as described above  ? ESOPHAGOGASTRODUODENOSCOPY N/A 12/13/2012  ? OAC:ZYSAYT hernia. PReviously noted gastric ulcer completely healed.   ? ESOPHAGOGASTRODUODENOSCOPY (EGD) WITH PROPOFOL N/A 07/30/2014  ? Procedure: ESOPHAGOGASTRODUODENOSCOPY (EGD) WITH PROPOFOL;  Surgeon: Corbin Ade, MD;  Location: [facility];  Service: Endoscopy;  Laterality: N/A;  ? TONSILLECTOMY    ? ?Patient Active Problem List  ? Diagnosis Date Noted  ? Gout 12/29/2017  ? Tobacco abuse 12/29/2017   ? Acute hypoxemic respiratory failure (HCC) 12/29/2017  ? Bronchitis 12/29/2017  ? Rectal bleed 07/30/2014  ? Hiatal hernia   ? Hemorrhoid   ? Acute lower GI bleeding 07/29/2014  ? Abdominal pain   ? Wrist pain, acute   ? Tenosynovitis of finger, hand, or wrist   ? Cellulitis of hand 03/10/2014  ? Right upper quadrant abdominal pain 02/10/2014  ? Cholelithiasis 02/10/2014  ? Polyarthritis 11/08/2012  ? UTI (lower urinary tract infection) 10/21/2012  ? Cellulitis 09/17/2012  ? Tinea pedis 09/17/2012  ? Erosive esophagitis 09/07/2012  ? Hypokalemia 09/05/2012  ? Gastric ulcer with hemorrhage 09/02/2012  ? Upper GI bleed 09/02/2012  ? Acute blood loss anemia 09/02/2012  ? Hematemesis 09/01/2012  ? Normocytic anemia 09/01/2012  ? Acute gouty arthritis 09/01/2012  ? Morbid obesity (HCC) 09/01/2012  ? ? ?REFERRING DIAG: Bilateral LE Weakness  ?  ?THERAPY DIAG:  ?Bilateral leg weakness ? ?Difficulty in walking, not elsewhere classified ? ?Impaired mobility and ADLs ? ?Impaired functional mobility and endurance ? ?PERTINENT HISTORY: Gout and Gastrointestinal hemorrhage  ? ?PRECAUTIONS: Fall ? ?SUBJECTIVE: Pt stated he is feeling better today, general foot and hand pain today.  Reports some swelling in  ? ?PAIN:  ?Are you having pain? Yes ?NPRS scale: 4 /10 ?Pain location: hands and feet  ?Pain orientation: Right and Left  ?PAIN TYPE: aching ?Pain description: intermittent  ?Aggravating factors: foot placement  ?Relieving factors:  nothing, elevation ? ? ? ?TODAY'S TREATMENT:  ?Educated benefits with compression garments for edema control, measurements and shown butler ? ?Standing tolerance 55"  ?Toe tapping 4in step height ?Squat front of WC 10x with HHA ?Standing row GTB 15x 3" ?Sidestep  inside // bars ?Forward/retro gait 2RT inside // bars ?PATIENT EDUCATION: ?Education details:  Educated benefits with compression garments for edema control, measurements and shown butler ?Person educated: Patient ?Education method:  Explanation ?Education comprehension: verbalized understanding ? ? ?HOME EXERCISE PROGRAM: ?Sidelying Hip Abduction - 1-2 x daily - 7 x weekly - 3 sets - 8 reps  Active Straight Leg Raise with Quad Set - 1-2 x daily - 7 x weekly - 2 sets - 8 reps  Seated Knee Extension AROM - 1-2 x daily - 7 x weekly - 3 sets - 14 reps  Seated Hamstring Curl with Anchored Resistance - 1-2 x daily - 7 x weekly - 3 sets - 16 reps  Seated Ankle Alphabet - 2-3 x daily - 7 x weekly - 1 reps; 2/28: standing balalnce NBOS ? ? PT Short Term Goals - 05/30/21 1248   ? ?  ? PT SHORT TERM GOAL #1  ? Title Patient will be able to stand with AD for at least 1 minute before requiring a seated rest break.   ? Time 3   ? Period Weeks   ? Status New   ? Target Date 06/20/21   ?  ? PT SHORT TERM GOAL #2  ? Title Patient will be able negotiate a flight of 4 steps with MI and supervision.   ? Time 3   ? Period Weeks   ? Status New   ? Target Date 06/20/21   ? ?  ?  ? ?  ? ? ? PT Long Term Goals - 05/30/21 1249   ? ?  ? PT LONG TERM GOAL #1  ? Title Patient will be able to stand with AD for at least 3 minutes before requiring a seated rest break.   ? Time 6   ? Period Weeks   ? Status New   ? Target Date 07/11/21   ?  ? PT LONG TERM GOAL #2  ? Title Patient will be able negotiate a flight of 4 steps with MI and supervision.   ? Time 6   ? Period Weeks   ? Status New   ? Target Date 07/11/21   ?  ? PT LONG TERM GOAL #3  ? Title Patient will be able to ambulate at least 112ft with AD demonstrating improved(perfection is not assumed with patient's condition) gait mechanics.   ? Time 6   ? Period Weeks   ? Status New   ? Target Date 07/11/21   ?  ? PT LONG TERM GOAL #4  ? Title Patient will be able to complete 1 STS transfer without UE use.   ? Time 6   ? Period Weeks   ? Status New   ? Target Date 07/11/21   ? ?  ?  ? ?  ? ? ? Plan - 06/23/21 1300   ? ? Clinical Impression Statement Pt educated benefits with compression garments for edema control,  measurements taken and demonstrated use of butler to assist with donning.  Session focus with LE strengthening, standing balalnce and endurance.  Improved standing tolerance prior fatigue, continues to stand with WBOS and ER.  Educated importance of posture to assist with anterior knee pain, added standing rows  that was tolerated well. Noted dyspnea with minimal exertion, needed frequent seated rest breaks.   ? Personal Factors and Comorbidities Comorbidity 1;Fitness   ? Comorbidities Gout   ? Examination-Activity Limitations Bathing;Bed Mobility;Bend;Caring for Others;Carry;Dressing;Hygiene/Grooming;Lift;Toileting;Stand;Stairs;Squat;Sleep;Sit;Locomotion Level;Transfers   ? Examination-Participation Restrictions Psychologist, forensicCommunity Activity;Cleaning;Interpersonal Relationship;Laundry;Yard Work;Shop;Driving   ? Stability/Clinical Decision Making Stable/Uncomplicated   ? Clinical Decision Making Low   ? Rehab Potential Fair   ? PT Frequency 2x / week   ? PT Duration 6 weeks   ? PT Treatment/Interventions ADLs/Self Care Home Management;Aquatic Therapy;Psychologist, educationallectrical Stimulation;DME Instruction;Neuromuscular re-education;Balance training;Therapeutic exercise;Therapeutic activities;Stair training;Functional mobility training;Gait training;Patient/family education;Manual techniques;Passive range of motion;Energy conservation;Joint Manipulations   ? PT Next Visit Plan Continue with BLE joint mobility exercises and coach on stair negotiation per patient tolerance. Try to focus on improving endurance as well.   ? PT Home Exercise Plan Sidelying Hip Abduction - 1-2 x daily - 7 x weekly - 3 sets - 8 reps  Active Straight Leg Raise with Quad Set - 1-2 x daily - 7 x weekly - 2 sets - 8 reps  Seated Knee Extension AROM - 1-2 x daily - 7 x weekly - 3 sets - 14 reps  Seated Hamstring Curl with Anchored Resistance - 1-2 x daily - 7 x weekly - 3 sets - 16 reps  Seated Ankle Alphabet - 2-3 x daily - 7 x weekly - 1 reps; 2/28: standing balalnce NBOS    ? Consulted and Agree with Plan of Care Patient   ? ?  ?  ? ?  ? ? ? ?Becky Saxasey Hulen Mandler, LPTA/CLT; CBIS [phone] ? ?Juel Burrowockerham, Quilla Freeze Jo, PTA ?06/23/2021, 1:08 PM ? ?  ? ?

## 2021-06-27 ENCOUNTER — Encounter (HOSPITAL_COMMUNITY): Payer: Self-pay

## 2021-06-27 ENCOUNTER — Ambulatory Visit (HOSPITAL_COMMUNITY): Payer: Medicaid Other

## 2021-06-27 ENCOUNTER — Other Ambulatory Visit: Payer: Self-pay

## 2021-06-27 DIAGNOSIS — Z7409 Other reduced mobility: Secondary | ICD-10-CM

## 2021-06-27 DIAGNOSIS — R29898 Other symptoms and signs involving the musculoskeletal system: Secondary | ICD-10-CM | POA: Diagnosis not present

## 2021-06-27 DIAGNOSIS — R262 Difficulty in walking, not elsewhere classified: Secondary | ICD-10-CM

## 2021-06-27 NOTE — Therapy (Addendum)
PLAN: OUTPATIENT PHYSICAL THERAPY TREATMENT NOTE   Patient Name: [name] MRN: [id number] DOB:1985-07-31, 36 y.o., male Today's Date: 06/27/2021  PCP: Toma Deiters, MD REFERRING PROVIDER: Toma Deiters, MD   PT End of Session - 06/27/21 0831     Visit Number 5    Number of Visits 12    Date for PT Re-Evaluation 07/11/21    Authorization Type [place] [employer]    Authorization - Visit Number 5    Authorization - Number of Visits 12    Progress Note Due on Visit 10    PT Start Time 0853    PT Stop Time 0938    PT Time Calculation (min) 45 min    Activity Tolerance Patient limited by pain;Patient tolerated treatment well    Behavior During Therapy [facility] for tasks assessed/performed             Past Medical History:  Diagnosis Date   Acute blood loss anemia 09/02/2012   S/p 1 unit rbcs   Cellulitis    Erosive esophagitis 09/01/2012   NSAID induced.   Gall stones    Gastric ulcer with hemorrhage 09/02/2012   s/p bleeding control tx. Per Dr. Jena Gauss   Gout    Obesity    UTI (lower urinary tract infection)    Past Surgical History:  Procedure Laterality Date   COLONOSCOPY WITH PROPOFOL N/A 07/30/2014   RMR: Colonoscopy anal canal/internal hemorrhoids, otherwise normal ileocolonoscoopy.    ESOPHAGOGASTRODUODENOSCOPY Left 09/01/2012   WVP:XTGGYI esophageal erosions consistent with erosive reflux esophagitis/Pre-pyloric benign-appearing gastric ulcer with bleeding stigmata status post bleeding control therapy as described above   ESOPHAGOGASTRODUODENOSCOPY N/A 12/13/2012   RSW:NIOEVO hernia. PReviously noted gastric ulcer completely healed.    ESOPHAGOGASTRODUODENOSCOPY (EGD) WITH PROPOFOL N/A 07/30/2014   Procedure: ESOPHAGOGASTRODUODENOSCOPY (EGD) WITH PROPOFOL;  Surgeon: Corbin Ade, MD;  Location: [facility];  Service: Endoscopy;  Laterality: N/A;   TONSILLECTOMY     Patient Active Problem List   Diagnosis Date Noted   Gout 12/29/2017   Tobacco abuse  12/29/2017   Acute hypoxemic respiratory failure (HCC) 12/29/2017   Bronchitis 12/29/2017   Rectal bleed 07/30/2014   Hiatal hernia    Hemorrhoid    Acute lower GI bleeding 07/29/2014   Abdominal pain    Wrist pain, acute    Tenosynovitis of finger, hand, or wrist    Cellulitis of hand 03/10/2014   Right upper quadrant abdominal pain 02/10/2014   Cholelithiasis 02/10/2014   Polyarthritis 11/08/2012   UTI (lower urinary tract infection) 10/21/2012   Cellulitis 09/17/2012   Tinea pedis 09/17/2012   Erosive esophagitis 09/07/2012   Hypokalemia 09/05/2012   Gastric ulcer with hemorrhage 09/02/2012   Upper GI bleed 09/02/2012   Acute blood loss anemia 09/02/2012   Hematemesis 09/01/2012   Normocytic anemia 09/01/2012   Acute gouty arthritis 09/01/2012   Morbid obesity (HCC) 09/01/2012    REFERRING DIAG: Bilateral LE Weakness   THERAPY DIAG:  Bilateral leg weakness  Difficulty in walking, not elsewhere classified  Impaired mobility and ADLs  Impaired functional mobility and endurance  PERTINENT HISTORY: Gout and Gastrointestinal hemorrhage   PRECAUTIONS: Fall  SUBJECTIVE: Feet, ankle and hands hurting today, 7/10. Stiff this morning as the appointment time is kind of early for him.  PAIN:  Are you having pain? Yes: Pain location: Feet, ankle and hands Pain description: sharp in left knee, feet are dull and achy Aggravating factors: standing on left knee Relieving factors: sitting down  TODAY'S TREATMENT:  06/27/2021 STANDING all with upper extremity assist gastroc strettch using 4" step 20" X 2 each (unable to use slant boards due to knee discomfort) Lateral steps ups on 4 inch stair left  1x10 limited by left knee pain GTB rows and shoulder extensions 1x15 with 3" hold Sidestepping in // bars 2RT limited by left knee pain SEATED  LAQ 5" hold 2x10 bilateral  Shoulder press up x10 2# dumbbell bilateral, x10 4# dumbbell   PATIENT EDUCATION: Education  details: Self pacing, increased activity level at home as able Person educated: Patient Education method: Explanation Education comprehension: verbalized understanding   HOME EXERCISE PROGRAM: Sidelying Hip Abduction - 1-2 x daily - 7 x weekly - 3 sets - 8 reps  Active Straight Leg Raise with Quad Set - 1-2 x daily - 7 x weekly - 2 sets - 8 reps  Seated Knee Extension AROM - 1-2 x daily - 7 x weekly - 3 sets - 14 reps  Seated Hamstring Curl with Anchored Resistance - 1-2 x daily - 7 x weekly - 3 sets - 16 reps  Seated Ankle Alphabet - 2-3 x daily - 7 x weekly - 1 reps; 2/28: standing balance NBOS   PT Short Term Goals - 06/27/21 0831       PT SHORT TERM GOAL #1   Title Patient will be able to stand with AD for at least 1 minute before requiring a seated rest break.    Time 3    Period Weeks    Status New    Target Date 06/20/21      PT SHORT TERM GOAL #2   Title Patient will be able negotiate a flight of 4 steps with MI and supervision.    Time 3    Period Weeks    Status New    Target Date 06/20/21              PT Long Term Goals - 06/27/21 0831       PT LONG TERM GOAL #1   Title Patient will be able to stand with AD for at least 3 minutes before requiring a seated rest break.    Time 6    Period Weeks    Status New    Target Date 07/11/21      PT LONG TERM GOAL #2   Title Patient will be able negotiate a flight of 4 steps with MI and supervision.    Time 6    Period Weeks    Status New    Target Date 07/11/21      PT LONG TERM GOAL #3   Title Patient will be able to ambulate at least 133ft with AD demonstrating improved(perfection is not assumed with patient's condition) gait mechanics.    Time 6    Period Weeks    Status New    Target Date 07/11/21      PT LONG TERM GOAL #4   Title Patient will be able to complete 1 STS transfer without UE use.    Time 6    Period Weeks    Status New    Target Date 07/11/21              Plan - 06/27/21 0831      Clinical Impression Statement Session focused on strengthening activities. Patient with greater left knee pain complaints with weight bearing activities so finished session with seated exercises. Noted dyspnea with minimal exertion, needed frequent seated rest breaks.Patient  encouraged to do more weight bearing activities at home as tolerated. Advised patient he should do SLR lying in bed vs trying to do them in sitting. Patient verbalized understanding. Patient would continue to benefit from skilled physical therapy to reduce impairment and improve function.    Personal Factors and Comorbidities Comorbidity 1;Fitness    Comorbidities Gout    Examination-Activity Limitations Bathing;Bed Mobility;Bend;Caring for Others;Carry;Dressing;Hygiene/Grooming;Lift;Toileting;Stand;Stairs;Squat;Sleep;Sit;Locomotion Level;Transfers    Examination-Participation Restrictions Art gallery manager;Interpersonal Relationship;Laundry;Yard Work;Shop;Driving    Stability/Clinical Decision Making Stable/Uncomplicated    Rehab Potential Fair    PT Frequency 2x / week    PT Duration 6 weeks    PT Treatment/Interventions ADLs/Self Care Home Management;Aquatic Therapy;Scientist, product/process development;Neuromuscular re-education;Balance training;Therapeutic exercise;Therapeutic activities;Stair training;Functional mobility training;Gait training;Patient/family education;Manual techniques;Passive range of motion;Energy conservation;Joint Manipulations    PT Next Visit Plan Continue with BLE joint mobility exercises and coach on stair negotiation per patient tolerance. Try to focus on improving endurance as well.    PT Home Exercise Plan Sidelying Hip Abduction - 1-2 x daily - 7 x weekly - 3 sets - 8 reps  Active Straight Leg Raise with Quad Set - 1-2 x daily - 7 x weekly - 2 sets - 8 reps  Seated Knee Extension AROM - 1-2 x daily - 7 x weekly - 3 sets - 14 reps  Seated Hamstring Curl with Anchored Resistance - 1-2 x  daily - 7 x weekly - 3 sets - 16 reps  Seated Ankle Alphabet - 2-3 x daily - 7 x weekly - 1 reps; 2/28: standing balalnce NBOS    Consulted and Agree with Plan of Care Patient              Floria Raveling. Hartnett-Rands, MS, PT Per [place] [phone]  Jeannie Done, PT 06/27/2021, 9:49 AM

## 2021-06-28 ENCOUNTER — Encounter (HOSPITAL_COMMUNITY): Payer: Medicaid Other | Admitting: Physical Therapy

## 2021-06-30 ENCOUNTER — Other Ambulatory Visit: Payer: Self-pay

## 2021-06-30 ENCOUNTER — Ambulatory Visit (HOSPITAL_COMMUNITY): Payer: Medicaid Other

## 2021-06-30 ENCOUNTER — Encounter (HOSPITAL_COMMUNITY): Payer: Self-pay

## 2021-06-30 DIAGNOSIS — R29898 Other symptoms and signs involving the musculoskeletal system: Secondary | ICD-10-CM | POA: Diagnosis not present

## 2021-06-30 NOTE — Therapy (Signed)
PLAN: ?OUTPATIENT PHYSICAL THERAPY TREATMENT NOTE ? ? ?Patient Name: [name] ?MRN: [id number] ?DOB:05/04/1985, 36 y.o., male ?Today's Date: 06/30/2021 ? ?PCP: Toma DeitersHasanaj, Xaje A, MD ?REFERRING PROVIDER: Toma DeitersHasanaj, Xaje A, MD ? ? PT End of Session - 06/30/21 0946   ? ? Visit Number 6   ? Number of Visits 12   ? Date for PT Re-Evaluation 07/11/21   ? Authorization Type [place] [employer]   ? Authorization - Visit Number 6   ? Authorization - Number of Visits 12   ? Progress Note Due on Visit 10   ? PT Start Time [phone]   ? PT Stop Time 1030   ? PT Time Calculation (min) 44 min   ? Activity Tolerance Patient limited by pain;Patient tolerated treatment well   ? [facility] assessed/performed   ? ?  ?  ? ?  ? ? ?Past Medical History:  ?Diagnosis Date  ? Acute blood loss anemia 09/02/2012  ? S/p 1 unit rbcs  ? Cellulitis   ? Erosive esophagitis 09/01/2012  ? NSAID induced.  ? Gall stones   ? Gastric ulcer with hemorrhage 09/02/2012  ? s/p bleeding control tx. Per Dr. Jena Gaussourk  ? Gout   ? Obesity   ? UTI (lower urinary tract infection)   ? ?Past Surgical History:  ?Procedure Laterality Date  ? COLONOSCOPY WITH PROPOFOL N/A 07/30/2014  ? RMR: Colonoscopy anal canal/internal hemorrhoids, otherwise normal ileocolonoscoopy.   ? ESOPHAGOGASTRODUODENOSCOPY Left 09/01/2012  ? ZDG:LOVFIERMR:Distal esophageal erosions consistent with erosive reflux esophagitis/Pre-pyloric benign-appearing gastric ulcer with bleeding stigmata status post bleeding control therapy as described above  ? ESOPHAGOGASTRODUODENOSCOPY N/A 12/13/2012  ? PPI:RJJOACRMR:Hiatal hernia. PReviously noted gastric ulcer completely healed.   ? ESOPHAGOGASTRODUODENOSCOPY (EGD) WITH PROPOFOL N/A 07/30/2014  ? Procedure: ESOPHAGOGASTRODUODENOSCOPY (EGD) WITH PROPOFOL;  Surgeon: Corbin Adeobert M Rourk, MD;  Location: [facility];  Service: Endoscopy;  Laterality: N/A;  ? TONSILLECTOMY    ? ?Patient Active Problem List  ? Diagnosis Date Noted  ? Gout 12/29/2017  ? Tobacco abuse  12/29/2017  ? Acute hypoxemic respiratory failure (HCC) 12/29/2017  ? Bronchitis 12/29/2017  ? Rectal bleed 07/30/2014  ? Hiatal hernia   ? Hemorrhoid   ? Acute lower GI bleeding 07/29/2014  ? Abdominal pain   ? Wrist pain, acute   ? Tenosynovitis of finger, hand, or wrist   ? Cellulitis of hand 03/10/2014  ? Right upper quadrant abdominal pain 02/10/2014  ? Cholelithiasis 02/10/2014  ? Polyarthritis 11/08/2012  ? UTI (lower urinary tract infection) 10/21/2012  ? Cellulitis 09/17/2012  ? Tinea pedis 09/17/2012  ? Erosive esophagitis 09/07/2012  ? Hypokalemia 09/05/2012  ? Gastric ulcer with hemorrhage 09/02/2012  ? Upper GI bleed 09/02/2012  ? Acute blood loss anemia 09/02/2012  ? Hematemesis 09/01/2012  ? Normocytic anemia 09/01/2012  ? Acute gouty arthritis 09/01/2012  ? Morbid obesity (HCC) 09/01/2012  ? ? ?REFERRING DIAG: Bilateral LE Weakness  ? ?THERAPY DIAG:  ?Bilateral leg weakness ? ?Difficulty in walking, not elsewhere classified ? ?Impaired mobility and ADLs ? ?Impaired functional mobility and endurance ? ?PERTINENT HISTORY: Gout and Gastrointestinal hemorrhage  ? ?PRECAUTIONS: Fall ? ?SUBJECTIVE: Feet, ankle and hurting today, 6/10. Reports of a fall yesterday while slipping in bathroom with right hand taking load with pointer finger swollen and hurt, patient denies wanting follow up care for it.  ? ?PAIN:  ?Are you having pain? Yes: Pain location: Feet, ankle and hands ?Pain description: sharp in left knee, feet are  dull and achy ?Aggravating factors: standing on left knee ?Relieving factors: sitting down ? ? ? ? ?TODAY'S TREATMENT:   ?06/30/2021 ?Modified today as needed to use less B UE after new possible hand injury yesterday   ? Therapeutic exercises =  ?SEATED ? LAQ 5" hold 2x10 bilateral ? Shoulder press up 2 x10 4# dumbbell plus 2# wrist weights bilateral ?Posterior chain stretching for hamstring and calf, one leg out and reaching down to foot 2 x 20 seconds each leg ?STANDING all with upper  extremity assist on elbows today at parallel bars ?Hip abduction 2 x 10 with SLS opp leg as able cueing for stance leg quad and gluteal activation  ?Hip extension 2 x 10 with SLS opp leg as able ?Squats 1 x 10 and then 1 x 5 with cue for max glut and TKE at tall posturing each rep ?Blue TB rows and shoulder extensions 1x15 with 3" hold with DPT holding band in parallel bars  ?B heel raises with no arm support, cue for postural and balance control 2 x 10  ?Standing static x 1 min with cue for core and glut  ? ? ? ?06/27/2021 ?STANDING all with upper extremity assist ?gastroc strettch using 4" step 20" X 2 each (unable to use slant boards due to knee discomfort) ?Lateral steps ups on 4 inch stair left  1x10 limited by left knee pain ?GTB rows and shoulder extensions 1x15 with 3" hold ?Sidestepping in // bars 2RT limited by left knee pain ?SEATED ? LAQ 5" hold 2x10 bilateral ? Shoulder press up x10 2# dumbbell bilateral, x10 4# dumbbell ? ? ?PATIENT EDUCATION: ?Education details: Discussion and education for fall prevention with focus on bathroom safety with watching floors and foot placement. Continue with Self pacing, increased activity level at home as able ?Person educated: Patient ?Education method: Explanation ?Education comprehension: verbalized understanding ? ? ?HOME EXERCISE PROGRAM: ?Sidelying Hip Abduction - 1-2 x daily - 7 x weekly - 3 sets - 8 reps  Active Straight Leg Raise with Quad Set - 1-2 x daily - 7 x weekly - 2 sets - 8 reps  Seated Knee Extension AROM - 1-2 x daily - 7 x weekly - 3 sets - 14 reps  Seated Hamstring Curl with Anchored Resistance - 1-2 x daily - 7 x weekly - 3 sets - 16 reps  Seated Ankle Alphabet - 2-3 x daily - 7 x weekly - 1 reps; 2/28: standing balance NBOS ? ? PT Short Term Goals - 06/27/21 0831   ? ?  ? PT SHORT TERM GOAL #1  ? Title Patient will be able to stand with AD for at least 1 minute before requiring a seated rest break.   ? Time 3   ? Period Weeks   ? Status New   ?  Target Date 06/20/21   ?  ? PT SHORT TERM GOAL #2  ? Title Patient will be able negotiate a flight of 4 steps with MI and supervision.   ? Time 3   ? Period Weeks   ? Status New   ? Target Date 06/20/21   ? ?  ?  ? ?  ? ? ? PT Long Term Goals - 06/27/21 0831   ? ?  ? PT LONG TERM GOAL #1  ? Title Patient will be able to stand with AD for at least 3 minutes before requiring a seated rest break.   ? Time 6   ? Period Weeks   ?  Status New   ? Target Date 07/11/21   ?  ? PT LONG TERM GOAL #2  ? Title Patient will be able negotiate a flight of 4 steps with MI and supervision.   ? Time 6   ? Period Weeks   ? Status New   ? Target Date 07/11/21   ?  ? PT LONG TERM GOAL #3  ? Title Patient will be able to ambulate at least 161ft with AD demonstrating improved(perfection is not assumed with patient's condition) gait mechanics.   ? Time 6   ? Period Weeks   ? Status New   ? Target Date 07/11/21   ?  ? PT LONG TERM GOAL #4  ? Title Patient will be able to complete 1 STS transfer without UE use.   ? Time 6   ? Period Weeks   ? Status New   ? Target Date 07/11/21   ? ?  ?  ? ?  ? ? ? Plan - 06/30/21 1004   ? ? Clinical Impression Statement Today's session focused on continuing building strenghthening activities with minimal modification secondary to new right hand injury. Patient preformed increased activities from monday and showed good motivation as he reports he was discouraged after that session.  Demonstrated good ability to move through B UE strengthening and needs conitnued cueing for decreased use of weight bearing on arms during leg strengthening activities.  Patient would continue to benefit from skilled physical therapy to reduce impairment and improved function.   ? Personal Factors and Comorbidities Comorbidity 1;Fitness   ? Comorbidities Gout   ? Examination-Activity Limitations Bathing;Bed Mobility;Bend;Caring for Others;Carry;Dressing;Hygiene/Grooming;Lift;Toileting;Stand;Stairs;Squat;Sleep;Sit;Locomotion  Level;Transfers   ? Examination-Participation Restrictions Psychologist, forensic;Interpersonal Relationship;Laundry;Yard Work;Shop;Driving   ? Stability/Clinical Decision Making Stable/Uncomplicated   ? Rehab Pot

## 2021-07-04 ENCOUNTER — Encounter (HOSPITAL_COMMUNITY): Payer: Medicaid Other | Admitting: Physical Therapy

## 2021-07-05 ENCOUNTER — Encounter (HOSPITAL_COMMUNITY): Payer: Medicaid Other | Admitting: Physical Therapy

## 2021-07-06 ENCOUNTER — Other Ambulatory Visit: Payer: Self-pay

## 2021-07-06 ENCOUNTER — Ambulatory Visit (HOSPITAL_COMMUNITY): Payer: Medicaid Other | Admitting: Physical Therapy

## 2021-07-06 DIAGNOSIS — R29898 Other symptoms and signs involving the musculoskeletal system: Secondary | ICD-10-CM

## 2021-07-06 DIAGNOSIS — Z789 Other specified health status: Secondary | ICD-10-CM

## 2021-07-06 DIAGNOSIS — R262 Difficulty in walking, not elsewhere classified: Secondary | ICD-10-CM

## 2021-07-06 DIAGNOSIS — Z7409 Other reduced mobility: Secondary | ICD-10-CM

## 2021-07-06 NOTE — Therapy (Signed)
?OUTPATIENT PHYSICAL THERAPY TREATMENT NOTE ? ? ?Patient Name: [name] ?MRN: [id number] ?DOB:July 01, 1985, 36 y.o., male ?Today's Date: 07/06/2021 ? ?PCP: Toma Deiters, MD ?REFERRING PROVIDER: Toma Deiters, MD ? ? PT End of Session - 07/06/21 1310   ? ? Visit Number 7   ? Number of Visits 12   ? Date for PT Re-Evaluation 07/11/21   ? Authorization Type [place] [employer]   ? Authorization - Visit Number 7   ? Authorization - Number of Visits 12   ? Progress Note Due on Visit 10   ? PT Start Time 1309   ? PT Stop Time 1342   ? PT Time Calculation (min) 33 min   ? Activity Tolerance Patient limited by pain;Patient tolerated treatment well   ? Behavior During Therapy [facility] for tasks assessed/performed   ? ?  ?  ? ?  ? ? ?Past Medical History:  ?Diagnosis Date  ? Acute blood loss anemia 09/02/2012  ? S/p 1 unit rbcs  ? Cellulitis   ? Erosive esophagitis 09/01/2012  ? NSAID induced.  ? Gall stones   ? Gastric ulcer with hemorrhage 09/02/2012  ? s/p bleeding control tx. Per Dr. Jena Gauss  ? Gout   ? Obesity   ? UTI (lower urinary tract infection)   ? ?Past Surgical History:  ?Procedure Laterality Date  ? COLONOSCOPY WITH PROPOFOL N/A 07/30/2014  ? RMR: Colonoscopy anal canal/internal hemorrhoids, otherwise normal ileocolonoscoopy.   ? ESOPHAGOGASTRODUODENOSCOPY Left 09/01/2012  ? KJZ:PHXTAV esophageal erosions consistent with erosive reflux esophagitis/Pre-pyloric benign-appearing gastric ulcer with bleeding stigmata status post bleeding control therapy as described above  ? ESOPHAGOGASTRODUODENOSCOPY N/A 12/13/2012  ? [id number]:[id number] hernia. PReviously noted gastric ulcer completely healed.   ? ESOPHAGOGASTRODUODENOSCOPY (EGD) WITH PROPOFOL N/A 07/30/2014  ? Procedure: ESOPHAGOGASTRODUODENOSCOPY (EGD) WITH PROPOFOL;  Surgeon: Corbin Ade, MD;  Location: [facility];  Service: Endoscopy;  Laterality: N/A;  ? TONSILLECTOMY    ? ?Patient Active Problem List  ? Diagnosis Date Noted  ? Gout 12/29/2017  ? Tobacco abuse 12/29/2017   ? Acute hypoxemic respiratory failure (HCC) 12/29/2017  ? Bronchitis 12/29/2017  ? Rectal bleed 07/30/2014  ? Hiatal hernia   ? Hemorrhoid   ? Acute lower GI bleeding 07/29/2014  ? Abdominal pain   ? Wrist pain, acute   ? Tenosynovitis of finger, hand, or wrist   ? Cellulitis of hand 03/10/2014  ? Right upper quadrant abdominal pain 02/10/2014  ? Cholelithiasis 02/10/2014  ? Polyarthritis 11/08/2012  ? UTI (lower urinary tract infection) 10/21/2012  ? Cellulitis 09/17/2012  ? Tinea pedis 09/17/2012  ? Erosive esophagitis 09/07/2012  ? Hypokalemia 09/05/2012  ? Gastric ulcer with hemorrhage 09/02/2012  ? Upper GI bleed 09/02/2012  ? Acute blood loss anemia 09/02/2012  ? Hematemesis 09/01/2012  ? Normocytic anemia 09/01/2012  ? Acute gouty arthritis 09/01/2012  ? Morbid obesity (HCC) 09/01/2012  ? ? ?REFERRING DIAG: Bilateral LE Weakness  ? ?THERAPY DIAG:  ?Bilateral leg weakness ? ?Difficulty in walking, not elsewhere classified ? ?Impaired mobility and ADLs ? ?Impaired functional mobility and endurance ? ?PERTINENT HISTORY: Gout and Gastrointestinal hemorrhage  ? ?PRECAUTIONS: Fall ? ?SUBJECTIVE: Patient reports that the pain in his Rt index finger has improved, but he did hit it off of the kitchen counter yesterday which increased the pain. His knee and ankle pain are at a "normal" level today. ? ?PAIN:  ?Are you having pain? Yes: NPRS scale: 4/10 ?Pain location: Feet, ankle and hands ?Pain description: sharp  in left knee, feet are dull and achy ?Aggravating factors: standing on left knee ?Relieving factors: sitting down ? ? ?TODAY'S TREATMENT:   ?07/06/2021 ?Standing:  ? -Marching in PBs 4x12 each ? -Bent over hip extension 4x10 each ? -Hip abduction 1x24 each ?Seated: ? -Cable resisted knee flexion #2 1x8 and 3x12 each ? ?06/30/2021 ?Modified today as needed to use less B UE after new possible hand injury yesterday       ?           Therapeutic exercises =  ?SEATED ?            LAQ 5" hold 2x10 bilateral ?             Shoulder press up 2 x10 4# dumbbell plus 2# wrist weights bilateral ?Posterior chain stretching for hamstring and calf, one leg out and reaching down to foot 2 x 20 seconds each leg ?STANDING all with upper extremity assist on elbows today at parallel bars ?Hip abduction 2 x 10 with SLS opp leg as able cueing for stance leg quad and gluteal activation  ?Hip extension 2 x 10 with SLS opp leg as able ?Squats 1 x 10 and then 1 x 5 with cue for max glut and TKE at tall posturing each rep ?Blue TB rows and shoulder extensions 1x15 with 3" hold with DPT holding band in parallel bars  ?B heel raises with no arm support, cue for postural and balance control 2 x 10  ?Standing static x 1 min with cue for core and glut  ?  ?  ?  ?06/27/2021 ?STANDING all with upper extremity assist ?gastroc strettch using 4" step 20" X 2 each (unable to use slant boards due to knee discomfort) ?Lateral steps ups on 4 inch stair left  1x10 limited by left knee pain ?GTB rows and shoulder extensions 1x15 with 3" hold ?Sidestepping in // bars 2RT limited by left knee pain ?SEATED ?           LAQ 5" hold 2x10 bilateral ?           Shoulder press up x10 2# dumbbell bilateral, x10 4# dumbbell ?  ?  ?PATIENT EDUCATION: ?Education details: Discussion and education for fall prevention with focus on bathroom safety with watching floors and foot placement. Continue with Self pacing, increased activity level at home as able ?Person educated: Patient ?Education method: Explanation ?Education comprehension: verbalized understanding ?  ?  ?HOME EXERCISE PROGRAM: ?Sidelying Hip Abduction - 1-2 x daily - 7 x weekly - 3 sets - 8 reps  Active Straight Leg Raise with Quad Set - 1-2 x daily - 7 x weekly - 2 sets - 8 reps  Seated Knee Extension AROM - 1-2 x daily - 7 x weekly - 3 sets - 14 reps  Seated Hamstring Curl with Anchored Resistance - 1-2 x daily - 7 x weekly - 3 sets - 16 reps  Seated Ankle Alphabet - 2-3 x daily - 7 x weekly - 1 reps; 2/28: standing  balance NBOS ?  ?  PT Short Term Goals - 06/27/21 0831   ?  ?    ?     ?  PT SHORT TERM GOAL #1  ?  Title Patient will be able to stand with AD for at least 1 minute before requiring a seated rest break.   ?  Time 3   ?  Period Weeks   ?  Status New   ?  Target Date 06/20/21   ?     ?  PT SHORT TERM GOAL #2  ?  Title Patient will be able negotiate a flight of 4 steps with MI and supervision.   ?  Time 3   ?  Period Weeks   ?  Status New   ?  Target Date 06/20/21   ?  ?   ?  ?  ?   ?  ?  ?  PT Long Term Goals - 06/27/21 0831   ?  ?    ?     ?  PT LONG TERM GOAL #1  ?  Title Patient will be able to stand with AD for at least 3 minutes before requiring a seated rest break.   ?  Time 6   ?  Period Weeks   ?  Status New   ?  Target Date 07/11/21   ?     ?  PT LONG TERM GOAL #2  ?  Title Patient will be able negotiate a flight of 4 steps with MI and supervision.   ?  Time 6   ?  Period Weeks   ?  Status New   ?  Target Date 07/11/21   ?     ?  PT LONG TERM GOAL #3  ?  Title Patient will be able to ambulate at least [id number] with AD demonstrating improved(perfection is not assumed with patient's condition) gait mechanics.   ?  Time 6   ?  Period Weeks   ?  Status New   ?  Target Date 07/11/21   ?     ?  PT LONG TERM GOAL #4  ?  Title Patient will be able to complete 1 STS transfer without UE use.   ?  Time 6   ?  Period Weeks   ?  Status New   ?  Target Date 07/11/21   ?  ?   ?  ?  ?   ?  ?  ?  Plan - 06/30/21 1004   ?  ?  Clinical Impression Statement Patient tolerated treatment well during today's session and was able to remain in a standing position longer than expected before a seated rest break was needed. Bilateral shoulder pain from weightbearing through the parallel bars did result in the session being finished with seated exercise. Of note, the patient did arrive late and also requested that he leave the session early as they have to go and "pick the kids up" from daycare.  ?  Personal Factors and Comorbidities  Comorbidity 1;Fitness   ?  Comorbidities Gout   ?  Examination-Activity Limitations Bathing;Bed Mobility;Bend;Caring for Others;Carry;Dressing;Hygiene/Grooming;Lift;Toileting;Stand;Stairs;Squat;Sleep;Sit;L

## 2021-07-07 ENCOUNTER — Ambulatory Visit (HOSPITAL_COMMUNITY): Payer: Medicaid Other | Admitting: Physical Therapy

## 2021-07-07 ENCOUNTER — Encounter (HOSPITAL_COMMUNITY): Payer: Self-pay | Admitting: Physical Therapy

## 2021-07-07 DIAGNOSIS — R29898 Other symptoms and signs involving the musculoskeletal system: Secondary | ICD-10-CM | POA: Diagnosis not present

## 2021-07-07 DIAGNOSIS — Z7409 Other reduced mobility: Secondary | ICD-10-CM

## 2021-07-07 DIAGNOSIS — R262 Difficulty in walking, not elsewhere classified: Secondary | ICD-10-CM

## 2021-07-07 NOTE — Therapy (Signed)
?OUTPATIENT PHYSICAL THERAPY TREATMENT NOTE ? ? ?Patient Name: [name] ?MRN: [id number] ?DOB:1985-06-11, 36 y.o., male ?Today's Date: 07/07/2021 ? ?PCP: Neale Burly, MD ?REFERRING PROVIDER: Neale Burly, MD ? ?Progress Note  ? ?Reporting Period 05/30/21 to 07/07/21 ? ? See note below for Objective Data and Assessment of Progress/Goals  ? ? PT End of Session - 07/07/21 0919   ? ? Visit Number 8   ? Number of Visits 24   ? Date for PT Re-Evaluation 08/18/21   ? Authorization Type [place] [employer]   ? Authorization Time Period 12 visits requested - check auth   ? Authorization - Visit Number 8   ? Authorization - Number of Visits 12   ? Progress Note Due on Visit 18   ? PT Start Time [phone]   ? PT Stop Time [phone]   ? PT Time Calculation (min) 38 min   ? Activity Tolerance Patient limited by pain;Patient tolerated treatment well   ? Behavior During Therapy [facility] for tasks assessed/performed   ? ?  ?  ? ?  ? ? ?Past Medical History:  ?Diagnosis Date  ? Acute blood loss anemia 09/02/2012  ? S/p 1 unit rbcs  ? Cellulitis   ? Erosive esophagitis 09/01/2012  ? NSAID induced.  ? Gall stones   ? Gastric ulcer with hemorrhage 09/02/2012  ? s/p bleeding control tx. Per Dr. Gala Romney  ? Gout   ? Obesity   ? UTI (lower urinary tract infection)   ? ?Past Surgical History:  ?Procedure Laterality Date  ? COLONOSCOPY WITH PROPOFOL N/A 07/30/2014  ? RMR: Colonoscopy anal canal/internal hemorrhoids, otherwise normal ileocolonoscoopy.   ? ESOPHAGOGASTRODUODENOSCOPY Left 09/01/2012  ? HQI:ONGEXB esophageal erosions consistent with erosive reflux esophagitis/Pre-pyloric benign-appearing gastric ulcer with bleeding stigmata status post bleeding control therapy as described above  ? ESOPHAGOGASTRODUODENOSCOPY N/A 12/13/2012  ? [id number] hernia. PReviously noted gastric ulcer completely healed.   ? ESOPHAGOGASTRODUODENOSCOPY (EGD) WITH PROPOFOL N/A 07/30/2014  ? Procedure: ESOPHAGOGASTRODUODENOSCOPY (EGD) WITH PROPOFOL;  Surgeon: Daneil Dolin, MD;  Location: [facility];  Service: Endoscopy;  Laterality: N/A;  ? TONSILLECTOMY    ? ?Patient Active Problem List  ? Diagnosis Date Noted  ? Gout 12/29/2017  ? Tobacco abuse 12/29/2017  ? Acute hypoxemic respiratory failure ([place]) 12/29/2017  ? Bronchitis 12/29/2017  ? Rectal bleed 07/30/2014  ? Hiatal hernia   ? Hemorrhoid   ? Acute lower GI bleeding 07/29/2014  ? Abdominal pain   ? Wrist pain, acute   ? Tenosynovitis of finger, hand, or wrist   ? Cellulitis of hand 03/10/2014  ? Right upper quadrant abdominal pain 02/10/2014  ? Cholelithiasis 02/10/2014  ? Polyarthritis 11/08/2012  ? UTI (lower urinary tract infection) 10/21/2012  ? Cellulitis 09/17/2012  ? Tinea pedis 09/17/2012  ? Erosive esophagitis 09/07/2012  ? Hypokalemia 09/05/2012  ? Gastric ulcer with hemorrhage 09/02/2012  ? Upper GI bleed 09/02/2012  ? Acute blood loss anemia 09/02/2012  ? Hematemesis 09/01/2012  ? Normocytic anemia 09/01/2012  ? Acute gouty arthritis 09/01/2012  ? Morbid obesity ([place]) 09/01/2012  ? ? ?REFERRING DIAG: Bilateral LE Weakness  ? ?THERAPY DIAG:  ?Bilateral leg weakness ? ?Difficulty in walking, not elsewhere classified ? ?Impaired mobility and ADLs ? ?Impaired functional mobility and endurance ? ?PERTINENT HISTORY: Gout and Gastrointestinal hemorrhage  ? ?PRECAUTIONS: Fall ? ?SUBJECTIVE: Patient states doing alright, a little stiff. His HEP is going well. Patient states 30-40% improvement with PT intervention. Patient states greatest limitation  is endurance, strength, fatigue. He states able to stand for at least 2 minutes. Holds on to [facility] when climbing up steps able to use rail for other steps.  ? ?PAIN:  ?Are you having pain? Yes: NPRS scale: 4/10 ?Pain location: Feet, ankle and hands ?Pain description: sharp in left knee, feet are dull and achy ?Aggravating factors: standing on left knee ?Relieving factors: sitting down ? ?Objective: ?LE ROM: decreased grossly ? ?Active ROM Right ?07/07/2021 Left ?07/07/2021  ?Hip  flexion    ?Hip extension    ?Hip abduction    ?Hip adduction    ?Hip internal rotation    ?Hip external rotation    ?Knee flexion    ?Knee extension    ?Ankle dorsiflexion    ?Ankle plantarflexion    ?Ankle inversion    ?Ankle eversion    ? (Blank rows = not tested) ?*= pain ? ?LE MMT: ? ?MMT Right ?07/07/2021 Left ?07/07/2021  ?Hip flexion    ?Hip extension    ?Hip abduction    ?Hip adduction    ?Hip internal rotation    ?Hip external rotation    ?Knee flexion 4+/5 4+/5  ?Knee extension 4+/5 4+/5  ?Ankle dorsiflexion    ?Ankle plantarflexion    ?Ankle inversion    ?Ankle eversion    ? (Blank rows = not tested) ?*= pain ? ? ?TODAY'S TREATMENT:   ?07/07/21 ?Marching in Pbs 4x 12 each ?LAQ 1x 5 5-10 second holds bilateral  ?Cable resisted knee flexion #4 4x20 each ?LAQ 1x 10 blue band at ankles ? ? ?07/06/2021 ?Standing:  ? -Marching in PBs 4x12 each ? -Bent over hip extension 4x10 each ? -Hip abduction 1x24 each ?Seated: ? -Cable resisted knee flexion #2 1x8 and 4x20 each ? ?06/30/2021 ?Modified today as needed to use less B UE after new possible hand injury yesterday       ?           Therapeutic exercises =  ?SEATED ?            LAQ 5" hold 2x10 bilateral ?            Shoulder press up 2 x10 4# dumbbell plus 2# wrist weights bilateral ?Posterior chain stretching for hamstring and calf, one leg out and reaching down to foot 2 x 20 seconds each leg ?STANDING all with upper extremity assist on elbows today at parallel bars ?Hip abduction 2 x 10 with SLS opp leg as able cueing for stance leg quad and gluteal activation  ?Hip extension 2 x 10 with SLS opp leg as able ?Squats 1 x 10 and then 1 x 5 with cue for max glut and TKE at tall posturing each rep ?Blue TB rows and shoulder extensions 1x15 with 3" hold with DPT holding band in parallel bars  ?B heel raises with no arm support, cue for postural and balance control 2 x 10  ?Standing static x 1 min with cue for core and glut  ?  ?  ?  ?06/27/2021 ?STANDING all with upper  extremity assist ?gastroc strettch using 4" step 20" X 2 each (unable to use slant boards due to knee discomfort) ?Lateral steps ups on 4 inch stair left  1x10 limited by left knee pain ?GTB rows and shoulder extensions 1x15 with 3" hold ?Sidestepping in // bars 2RT limited by left knee pain ?SEATED ?           LAQ 5" hold 2x10 bilateral ?  Shoulder press up x10 2# dumbbell bilateral, x10 4# dumbbell ?  ?  ?PATIENT EDUCATION: ?Education details: Discussion and education for fall prevention with focus on bathroom safety with watching floors and foot placement. Continue with Self pacing, increased activity level at home as able ?Person educated: Patient ?Education method: Explanation ?Education comprehension: verbalized understanding ?  ?  ?HOME EXERCISE PROGRAM: ?Sidelying Hip Abduction - 1-2 x daily - 7 x weekly - 3 sets - 8 reps  Active Straight Leg Raise with Quad Set - 1-2 x daily - 7 x weekly - 2 sets - 8 reps  Seated Knee Extension AROM - 1-2 x daily - 7 x weekly - 3 sets - 14 reps  Seated Hamstring Curl with Anchored Resistance - 1-2 x daily - 7 x weekly - 3 sets - 16 reps  Seated Ankle Alphabet - 2-3 x daily - 7 x weekly - 1 reps; 2/28: standing balance NBOS ?  ?  PT Short Term Goals   ?  ?    ?     ?  PT SHORT TERM GOAL #1  ?  Title Patient will be able to stand with AD for at least 1 minute before requiring a seated rest break.   ?  Time 3   ?  Period Weeks   ?  Status MET  ?  Target Date 06/20/21   ?     ?  PT SHORT TERM GOAL #2  ?  Title Patient will be able negotiate a flight of 4 steps with MI and supervision.   ?  Time 3   ?  Period Weeks   ?  Status ongoing  ?  Target Date 06/20/21   ?  ?   ?  ?  ?   ?  ?  ?  PT Long Term Goals   ?  ?    ?     ?  PT LONG TERM GOAL #1  ?  Title Patient will be able to stand with AD for at least 3 minutes before requiring a seated rest break.   ?  Time 6   ?  Period Weeks   ?  Status  ongoing   ?  Target Date 07/11/21   ?     ?  PT LONG TERM GOAL #2  ?  Title  Patient will be able negotiate a flight of 4 steps with MI and supervision.   ?  Time 6   ?  Period Weeks   ?  Status ongoing  ?  Target Date 07/11/21   ?     ?  PT LONG TERM GOAL #3  ?  Title Patient wil

## 2021-07-12 ENCOUNTER — Encounter (HOSPITAL_COMMUNITY): Payer: Medicaid Other | Admitting: Physical Therapy

## 2021-07-14 ENCOUNTER — Ambulatory Visit (HOSPITAL_COMMUNITY): Payer: Medicaid Other

## 2021-07-14 ENCOUNTER — Encounter (HOSPITAL_COMMUNITY): Payer: Self-pay

## 2021-07-14 DIAGNOSIS — Z789 Other specified health status: Secondary | ICD-10-CM

## 2021-07-14 DIAGNOSIS — R262 Difficulty in walking, not elsewhere classified: Secondary | ICD-10-CM

## 2021-07-14 DIAGNOSIS — R29898 Other symptoms and signs involving the musculoskeletal system: Secondary | ICD-10-CM | POA: Diagnosis not present

## 2021-07-14 DIAGNOSIS — Z7409 Other reduced mobility: Secondary | ICD-10-CM

## 2021-07-14 NOTE — Therapy (Addendum)
?OUTPATIENT PHYSICAL THERAPY TREATMENT NOTE ? ? ?Patient Name: [name] ?MRN: [id number] ?DOB:July 10, 1985, 36 y.o., male ?Today's Date: 07/20/2021 ? ?PCP: Neale Burly, MD ?REFERRING PROVIDER: Neale Burly, MD ? ?Visit number 9/24 ?Re-eval date 08/18/21 ?[place] [employer] ?12 visits approved prior need for futher request ?Authorization Visit 9/12 ?Progress note due on visit #18 ?Time 1141-1219= 38 minutes ?Patient limited by pain.  Pt tolerated treatment well ?Behavior: [facility]F for tasks ? ? ? ? ?Past Medical History:  ?Diagnosis Date  ? Acute blood loss anemia 09/02/2012  ? S/p 1 unit rbcs  ? Cellulitis   ? Erosive esophagitis 09/01/2012  ? NSAID induced.  ? Gall stones   ? Gastric ulcer with hemorrhage 09/02/2012  ? s/p bleeding control tx. Per Dr. Gala Romney  ? Gout   ? Obesity   ? UTI (lower urinary tract infection)   ? ?Past Surgical History:  ?Procedure Laterality Date  ? COLONOSCOPY WITH PROPOFOL N/A 07/30/2014  ? RMR: Colonoscopy anal canal/internal hemorrhoids, otherwise normal ileocolonoscoopy.   ? ESOPHAGOGASTRODUODENOSCOPY Left 09/01/2012  ? QXI:[id number] esophageal erosions consistent with erosive reflux esophagitis/Pre-pyloric benign-appearing gastric ulcer with bleeding stigmata status post bleeding control therapy as described above  ? ESOPHAGOGASTRODUODENOSCOPY N/A 12/13/2012  ? KCM:[id number] hernia. PReviously noted gastric ulcer completely healed.   ? ESOPHAGOGASTRODUODENOSCOPY (EGD) WITH PROPOFOL N/A 07/30/2014  ? Procedure: ESOPHAGOGASTRODUODENOSCOPY (EGD) WITH PROPOFOL;  Surgeon: Daneil Dolin, MD;  Location: [facility];  Service: Endoscopy;  Laterality: N/A;  ? TONSILLECTOMY    ? ?Patient Active Problem List  ? Diagnosis Date Noted  ? Gout 12/29/2017  ? Tobacco abuse 12/29/2017  ? Acute hypoxemic respiratory failure ([place]) 12/29/2017  ? Bronchitis 12/29/2017  ? Rectal bleed 07/30/2014  ? Hiatal hernia   ? Hemorrhoid   ? Acute lower GI bleeding 07/29/2014  ? Abdominal pain   ? Wrist pain, acute   ?  Tenosynovitis of finger, hand, or wrist   ? Cellulitis of hand 03/10/2014  ? Right upper quadrant abdominal pain 02/10/2014  ? Cholelithiasis 02/10/2014  ? Polyarthritis 11/08/2012  ? UTI (lower urinary tract infection) 10/21/2012  ? Cellulitis 09/17/2012  ? Tinea pedis 09/17/2012  ? Erosive esophagitis 09/07/2012  ? Hypokalemia 09/05/2012  ? Gastric ulcer with hemorrhage 09/02/2012  ? Upper GI bleed 09/02/2012  ? Acute blood loss anemia 09/02/2012  ? Hematemesis 09/01/2012  ? Normocytic anemia 09/01/2012  ? Acute gouty arthritis 09/01/2012  ? Morbid obesity ([place]) 09/01/2012  ? ? ?REFERRING DIAG: Bilateral LE Weakness  ? ?THERAPY DIAG:  ?Bilateral leg weakness ? ?Difficulty in walking, not elsewhere classified ? ?Impaired mobility and ADLs ? ?Impaired functional mobility and endurance ? ?PERTINENT HISTORY: Gout and Gastrointestinal hemorrhage  ? ?PRECAUTIONS: Fall ? ?SUBJECTIVE: Pt stated he continues to have pain Rt hand due to gout issues and unable to take medication.  Has been working on standing tolerance at home  ? ?PAIN:  ?Are you having pain? Yes: NPRS scale: 7/10 ?Pain location: Rt hand gout ?Pain description: sharp in left knee, feet are dull and achy ?Aggravating factors: standing on left knee ?Relieving factors: sitting down ? ?Objective: ?LE ROM: decreased grossly ? ?Active ROM Right ?07/20/2021 Left ?07/20/2021  ?Hip flexion    ?Hip extension    ?Hip abduction    ?Hip adduction    ?Hip internal rotation    ?Hip external rotation    ?Knee flexion    ?Knee extension    ?Ankle dorsiflexion    ?Ankle plantarflexion    ?Ankle  inversion    ?Ankle eversion    ? (Blank rows = not tested) ?*= pain ? ?LE MMT: ? ?MMT Right ? Left ?  ?Hip flexion    ?Hip extension    ?Hip abduction    ?Hip adduction    ?Hip internal rotation    ?Hip external rotation    ?Knee flexion 4+/5 4+/5  ?Knee extension 4+/5 4+/5  ?Ankle dorsiflexion    ?Ankle plantarflexion    ?Ankle inversion    ?Ankle eversion    ? (Blank rows = not  tested) ?*= pain ? ? ?TODAY'S TREATMENT:   ?07/14/21: ? Standing tolerance 2' 10"  ? UE flexion 10x BLE ? GTB rows 20x ? Toe tapping alternating 6in step height ? TKE Lt knee 2 sets 10x 5" ? Lt knee flexion with blue theraband ? Cable resisted knee flexion #4 20x each ? ?07/07/21 ?Marching in Pbs 4x 12 each ?LAQ 1x 5 5-10 second holds bilateral  ?Cable resisted knee flexion #4 4x20 each ?LAQ 1x 10 blue band at ankles ? ? ?07/06/2021 ?Standing:  ? -Marching in PBs 4x12 each ? -Bent over hip extension 4x10 each ? -Hip abduction 1x24 each ?Seated: ? -Cable resisted knee flexion #2 1x8 and 4x20 each ? ?06/30/2021 ?Modified today as needed to use less B UE after new possible hand injury yesterday       ?           Therapeutic exercises =  ?SEATED ?            LAQ 5" hold 2x10 bilateral ?            Shoulder press up 2 x10 4# dumbbell plus 2# wrist weights bilateral ?Posterior chain stretching for hamstring and calf, one leg out and reaching down to foot 2 x 20 seconds each leg ?STANDING all with upper extremity assist on elbows today at parallel bars ?Hip abduction 2 x 10 with SLS opp leg as able cueing for stance leg quad and gluteal activation  ?Hip extension 2 x 10 with SLS opp leg as able ?Squats 1 x 10 and then 1 x 5 with cue for max glut and TKE at tall posturing each rep ?Blue TB rows and shoulder extensions 1x15 with 3" hold with DPT holding band in parallel bars  ?B heel raises with no arm support, cue for postural and balance control 2 x 10  ?Standing static x 1 min with cue for core and glut  ?  ?  ?  ?06/27/2021 ?STANDING all with upper extremity assist ?gastroc strettch using 4" step 20" X 2 each (unable to use slant boards due to knee discomfort) ?Lateral steps ups on 4 inch stair left  1x10 limited by left knee pain ?GTB rows and shoulder extensions 1x15 with 3" hold ?Sidestepping in // bars 2RT limited by left knee pain ?SEATED ?           LAQ 5" hold 2x10 bilateral ?           Shoulder press up x10 2# dumbbell  bilateral, x10 4# dumbbell ?  ?  ?PATIENT EDUCATION: ?Education details: Discussion and education for fall prevention with focus on bathroom safety with watching floors and foot placement. Continue with Self pacing, increased activity level at home as able ?Person educated: Patient ?Education method: Explanation ?Education comprehension: verbalized understanding ?  ?  ?HOME EXERCISE PROGRAM: ?Sidelying Hip Abduction - 1-2 x daily - 7 x weekly - 3 sets - 8 reps  Active Straight Leg Raise with Quad Set - 1-2 x daily - 7 x weekly - 2 sets - 8 reps  Seated Knee Extension AROM - 1-2 x daily - 7 x weekly - 3 sets - 14 reps  Seated Hamstring Curl with Anchored Resistance - 1-2 x daily - 7 x weekly - 3 sets - 16 reps  Seated Ankle Alphabet - 2-3 x daily - 7 x weekly - 1 reps; 2/28: standing balance NBOS ?  ?  PT Short Term Goals   ?  ?    ?     ?  PT SHORT TERM GOAL #1  ?  Title Patient will be able to stand with AD for at least 1 minute before requiring a seated rest break.   ?  Time 3   ?  Period Weeks   ?  Status MET  ?  Target Date 06/20/21   ?     ?  PT SHORT TERM GOAL #2  ?  Title Patient will be able negotiate a flight of 4 steps with MI and supervision.   ?  Time 3   ?  Period Weeks   ?  Status ongoing  ?  Target Date 06/20/21   ?  ?   ?  ?  ?   ?  ?  ?  PT Long Term Goals   ?  ?    ?     ?  PT LONG TERM GOAL #1  ?  Title Patient will be able to stand with AD for at least 3 minutes before requiring a seated rest break.   ?  Time 6   ?  Period Weeks   ?  Status  ongoing   ?  Target Date 07/11/21   ?     ?  PT LONG TERM GOAL #2  ?  Title Patient will be able negotiate a flight of 4 steps with MI and supervision.   ?  Time 6   ?  Period Weeks   ?  Status ongoing  ?  Target Date 07/11/21   ?     ?  PT LONG TERM GOAL #3  ?  Title Patient will be able to ambulate at least 154f with AD demonstrating improved(perfection is not assumed with patient's condition) gait mechanics.   ?  Time 6   ?  Period Weeks   ?  Status  ongoing  ?  Target Date 07/11/21   ?     ?  PT LONG TERM GOAL #4  ?  Title Patient will be able to complete 1 STS transfer without UE use.   ?  Time 6   ?  Period Weeks   ?  Status ongoing  ?  Target Date 07/11/21   ?

## 2021-07-18 ENCOUNTER — Encounter (HOSPITAL_COMMUNITY): Payer: Medicaid Other

## 2021-07-18 NOTE — Therapy (Incomplete)
?OUTPATIENT PHYSICAL THERAPY TREATMENT NOTE ? ? ?Patient Name: [name] ?MRN: [id number] ?DOB:1986/02/09, 36 y.o., male ?Today's Date: 07/18/2021 ? ?PCP: Neale Burly, MD ?REFERRING PROVIDER: Neale Burly, MD ? ?Progress Note  ? ?Reporting Period 05/30/21 to 07/07/21 ? ? See note below for Objective Data and Assessment of Progress/Goals  ? ? ? ? ?Past Medical History:  ?Diagnosis Date  ? Acute blood loss anemia 09/02/2012  ? S/p 1 unit rbcs  ? Cellulitis   ? Erosive esophagitis 09/01/2012  ? NSAID induced.  ? Gall stones   ? Gastric ulcer with hemorrhage 09/02/2012  ? s/p bleeding control tx. Per Dr. Gala Romney  ? Gout   ? Obesity   ? UTI (lower urinary tract infection)   ? ?Past Surgical History:  ?Procedure Laterality Date  ? COLONOSCOPY WITH PROPOFOL N/A 07/30/2014  ? RMR: Colonoscopy anal canal/internal hemorrhoids, otherwise normal ileocolonoscoopy.   ? ESOPHAGOGASTRODUODENOSCOPY Left 09/01/2012  ? VFI:EPPIRJ esophageal erosions consistent with erosive reflux esophagitis/Pre-pyloric benign-appearing gastric ulcer with bleeding stigmata status post bleeding control therapy as described above  ? ESOPHAGOGASTRODUODENOSCOPY N/A 12/13/2012  ? JOA:CZYSAY hernia. PReviously noted gastric ulcer completely healed.   ? ESOPHAGOGASTRODUODENOSCOPY (EGD) WITH PROPOFOL N/A 07/30/2014  ? Procedure: ESOPHAGOGASTRODUODENOSCOPY (EGD) WITH PROPOFOL;  Surgeon: Daneil Dolin, MD;  Location: [facility];  Service: Endoscopy;  Laterality: N/A;  ? TONSILLECTOMY    ? ?Patient Active Problem List  ? Diagnosis Date Noted  ? Gout 12/29/2017  ? Tobacco abuse 12/29/2017  ? Acute hypoxemic respiratory failure ([place]) 12/29/2017  ? Bronchitis 12/29/2017  ? Rectal bleed 07/30/2014  ? Hiatal hernia   ? Hemorrhoid   ? Acute lower GI bleeding 07/29/2014  ? Abdominal pain   ? Wrist pain, acute   ? Tenosynovitis of finger, hand, or wrist   ? Cellulitis of hand 03/10/2014  ? Right upper quadrant abdominal pain 02/10/2014  ? Cholelithiasis 02/10/2014  ?  Polyarthritis 11/08/2012  ? UTI (lower urinary tract infection) 10/21/2012  ? Cellulitis 09/17/2012  ? Tinea pedis 09/17/2012  ? Erosive esophagitis 09/07/2012  ? Hypokalemia 09/05/2012  ? Gastric ulcer with hemorrhage 09/02/2012  ? Upper GI bleed 09/02/2012  ? Acute blood loss anemia 09/02/2012  ? Hematemesis 09/01/2012  ? Normocytic anemia 09/01/2012  ? Acute gouty arthritis 09/01/2012  ? Morbid obesity ([place]) 09/01/2012  ? ? ?REFERRING DIAG: Bilateral LE Weakness  ? ?THERAPY DIAG:  ?No diagnosis found. ? ?PERTINENT HISTORY: Gout and Gastrointestinal hemorrhage  ? ?PRECAUTIONS: Fall ? ?SUBJECTIVE: Pt stated he continues to have pain Rt hand due to gout issues and unable to take medication.  Has been working on standing tolerance at home  ? ?PAIN:  ?Are you having pain? Yes: NPRS scale: 7/10 ?Pain location: Rt hand gout ?Pain description: sharp in left knee, feet are dull and achy ?Aggravating factors: standing on left knee ?Relieving factors: sitting down ? ?Objective: ?LE ROM: decreased grossly ? ?Active ROM Right ?07/18/2021 Left ?07/18/2021  ?Hip flexion    ?Hip extension    ?Hip abduction    ?Hip adduction    ?Hip internal rotation    ?Hip external rotation    ?Knee flexion    ?Knee extension    ?Ankle dorsiflexion    ?Ankle plantarflexion    ?Ankle inversion    ?Ankle eversion    ? (Blank rows = not tested) ?*= pain ? ?LE MMT: ? ?MMT Right ? Left ?  ?Hip flexion    ?Hip extension    ?Hip abduction    ?  Hip adduction    ?Hip internal rotation    ?Hip external rotation    ?Knee flexion 4+/5 4+/5  ?Knee extension 4+/5 4+/5  ?Ankle dorsiflexion    ?Ankle plantarflexion    ?Ankle inversion    ?Ankle eversion    ? (Blank rows = not tested) ?*= pain ? ? ?TODAY'S TREATMENT:   ?07/14/21: ? Standing tolerance _0  ? UE flexion 10x BLE ? GTB rows 20x ? Toe tapping alternating 6in step height ? TKE Lt knee 2 sets 10x 5" ? Lt knee flexion with blue theraband ? Cable resisted knee flexion #4 20x each ? ?07/07/21 ?Marching in Pbs  4x 12 each ?LAQ 1x 5 5-10 second holds bilateral  ?Cable resisted knee flexion #4 4x20 each ?LAQ 1x 10 blue band at ankles ? ? ?07/06/2021 ?Standing:  ? -Marching in PBs 4x12 each ? -Bent over hip extension 4x10 each ? -Hip abduction 1x24 each ?Seated: ? -Cable resisted knee flexion #2 1x8 and 4x20 each ? ?06/30/2021 ?Modified today as needed to use less B UE after new possible hand injury yesterday       ?           Therapeutic exercises =  ?SEATED ?            LAQ 5" hold 2x10 bilateral ?            Shoulder press up 2 x10 4# dumbbell plus 2# wrist weights bilateral ?Posterior chain stretching for hamstring and calf, one leg out and reaching down to foot 2 x 20 seconds each leg ?STANDING all with upper extremity assist on elbows today at parallel bars ?Hip abduction 2 x 10 with SLS opp leg as able cueing for stance leg quad and gluteal activation  ?Hip extension 2 x 10 with SLS opp leg as able ?Squats 1 x 10 and then 1 x 5 with cue for max glut and TKE at tall posturing each rep ?Blue TB rows and shoulder extensions 1x15 with 3" hold with DPT holding band in parallel bars  ?B heel raises with no arm support, cue for postural and balance control 2 x 10  ?Standing static x 1 min with cue for core and glut  ?  ?  ?  ?06/27/2021 ?STANDING all with upper extremity assist ?gastroc strettch using 4" step 20" X 2 each (unable to use slant boards due to knee discomfort) ?Lateral steps ups on 4 inch stair left  1x10 limited by left knee pain ?GTB rows and shoulder extensions 1x15 with 3" hold ?Sidestepping in // bars 2RT limited by left knee pain ?SEATED ?           LAQ 5" hold 2x10 bilateral ?           Shoulder press up x10 2# dumbbell bilateral, x10 4# dumbbell ?  ?  ?PATIENT EDUCATION: ?Education details: Discussion and education for fall prevention with focus on bathroom safety with watching floors and foot placement. Continue with Self pacing, increased activity level at home as able ?Person educated: Patient ?Education  method: Explanation ?Education comprehension: verbalized understanding ?  ?  ?HOME EXERCISE PROGRAM: ?Sidelying Hip Abduction - 1-2 x daily - 7 x weekly - 3 sets - 8 reps  Active Straight Leg Raise with Quad Set - 1-2 x daily - 7 x weekly - 2 sets - 8 reps  Seated Knee Extension AROM - 1-2 x daily - 7 x weekly - 3 sets - 14 reps  Seated  Hamstring Curl with Anchored Resistance - 1-2 x daily - 7 x weekly - 3 sets - 16 reps  Seated Ankle Alphabet - 2-3 x daily - 7 x weekly - 1 reps; 2/28: standing balance NBOS ?  ?  PT Short Term Goals   ?  ?    ?     ?  PT SHORT TERM GOAL #1  ?  Title Patient will be able to stand with AD for at least 1 minute before requiring a seated rest break.   ?  Time 3   ?  Period Weeks   ?  Status MET  ?  Target Date 06/20/21   ?     ?  PT SHORT TERM GOAL #2  ?  Title Patient will be able negotiate a flight of 4 steps with MI and supervision.   ?  Time 3   ?  Period Weeks   ?  Status ongoing  ?  Target Date 06/20/21   ?  ?   ?  ?  ?   ?  ?  ?  PT Long Term Goals   ?  ?    ?     ?  PT LONG TERM GOAL #1  ?  Title Patient will be able to stand with AD for at least 3 minutes before requiring a seated rest break.   ?  Time 6   ?  Period Weeks   ?  Status  ongoing   ?  Target Date 07/11/21   ?     ?  PT LONG TERM GOAL #2  ?  Title Patient will be able negotiate a flight of 4 steps with MI and supervision.   ?  Time 6   ?  Period Weeks   ?  Status ongoing  ?  Target Date 07/11/21   ?     ?  PT LONG TERM GOAL #3  ?  Title Patient will be able to ambulate at least 163f with AD demonstrating improved(perfection is not assumed with patient's condition) gait mechanics.   ?  Time 6   ?  Period Weeks   ?  Status ongoing  ?  Target Date 07/11/21   ?     ?  PT LONG TERM GOAL #4  ?  Title Patient will be able to complete 1 STS transfer without UE use.   ?  Time 6   ?  Period Weeks   ?  Status ongoing  ?  Target Date 07/11/21   ?  ?   ?  ?  ?   ?  ?  ?  Plan   ?  ?  Clinical Impression Statement Session focus  with standing tolerance and LE strengthening.  Pt able to stand 2'10" prior fatigue required HHA or seated break.  Added core and postural strengthening to improve standing endurance.  Pt c/o Lt knee buc

## 2021-07-26 ENCOUNTER — Ambulatory Visit (HOSPITAL_COMMUNITY): Payer: Medicaid Other | Attending: Internal Medicine

## 2021-07-26 ENCOUNTER — Telehealth (HOSPITAL_COMMUNITY): Payer: Self-pay

## 2021-07-26 DIAGNOSIS — R29898 Other symptoms and signs involving the musculoskeletal system: Secondary | ICD-10-CM | POA: Insufficient documentation

## 2021-07-26 DIAGNOSIS — R262 Difficulty in walking, not elsewhere classified: Secondary | ICD-10-CM | POA: Insufficient documentation

## 2021-07-26 NOTE — Telephone Encounter (Signed)
2nd no show, called and left message concerning missed apt.  Reminded next apt date and time and educated no show policy details and will need to schedule 1 at a time.  Included contact number for questions and if needs to cancel/reschedule future apts.   ? ?Becky Sax, LPTA/CLT; CBIS ?[phone] ? ?

## 2021-07-28 ENCOUNTER — Encounter (HOSPITAL_COMMUNITY): Payer: Medicaid Other | Admitting: Physical Therapy

## 2021-07-28 ENCOUNTER — Telehealth (HOSPITAL_COMMUNITY): Payer: Self-pay | Admitting: Physical Therapy

## 2021-07-28 NOTE — Telephone Encounter (Signed)
Patient no show, left message for patient to inform him of no show policy and that he would need a new order if he would like to return to physical therapy. ? ?9:38 AM, 07/28/21 ?Wyman Songster PT, DPT ?Physical Therapist at [facility] ?[facility] ? ?

## 2021-08-01 ENCOUNTER — Telehealth (HOSPITAL_COMMUNITY): Payer: Self-pay | Admitting: Physical Therapy

## 2021-08-02 ENCOUNTER — Encounter (HOSPITAL_COMMUNITY): Payer: Medicaid Other | Admitting: Physical Therapy

## 2021-08-04 ENCOUNTER — Encounter (HOSPITAL_COMMUNITY): Payer: Self-pay | Admitting: Physical Therapy

## 2021-08-04 ENCOUNTER — Encounter (HOSPITAL_COMMUNITY): Payer: Medicaid Other | Admitting: Physical Therapy

## 2021-08-04 NOTE — Therapy (Signed)
[facility] ?[address] ?[place], [place], [address] ?Phone: [phone]   Fax:  [phone] ? ?Patient Details  ?Name: [name] ?MRN: [id number] ?Date of Birth: 1985/04/26 ?Referring Provider:  No ref. provider found ? ?Encounter Date: 08/04/2021 ? ? ?PHYSICAL THERAPY DISCHARGE SUMMARY ? ?Visits from Start of Care: 9 ? ?Current functional level related to goals / functional outcomes: ?Patient had made good progress toward goals but is discharged due to attendance policy with several no show appointments. Patient was educated on getting new PT order to return. ?  ?Remaining deficits: ?Strength, activity tolerance, functional mobility ?  ?Education / Equipment: ?HEP  ? ?Patient agrees to discharge. Patient goals were partially met. Patient is being discharged due to not returning since the last visit. ? ?9:56 AM, 08/04/21 ?Mearl Latin PT, DPT ?Physical Therapist at [facility] ?[facility] ? ? ?[place] [facility] ?[address] ?[place], [place], [address] ?Phone: [phone]   Fax:  [phone] ?

## 2021-08-09 ENCOUNTER — Encounter (HOSPITAL_COMMUNITY): Payer: Medicaid Other | Admitting: Physical Therapy

## 2021-08-10 ENCOUNTER — Ambulatory Visit (HOSPITAL_COMMUNITY): Payer: Medicaid Other

## 2021-08-10 ENCOUNTER — Encounter (HOSPITAL_COMMUNITY): Payer: Self-pay | Admitting: Physical Therapy

## 2021-08-10 ENCOUNTER — Ambulatory Visit (HOSPITAL_COMMUNITY): Payer: Medicaid Other | Admitting: Physical Therapy

## 2021-08-10 DIAGNOSIS — R29898 Other symptoms and signs involving the musculoskeletal system: Secondary | ICD-10-CM | POA: Diagnosis present

## 2021-08-10 DIAGNOSIS — R262 Difficulty in walking, not elsewhere classified: Secondary | ICD-10-CM | POA: Diagnosis present

## 2021-08-10 NOTE — Therapy (Addendum)
?OUTPATIENT PHYSICAL THERAPY THORACOLUMBAR EVALUATION ? ? ?Patient Name: [name] ?MRN: [id number] ?DOB:18-Apr-1985, 36 y.o., male ?Today's Date: 08/10/2021 ? ? PT End of Session - 08/10/21 1101   ? ? Visit Number 1   ? Number of Visits 12   ? Date for PT Re-Evaluation 09/21/21   ? Authorization Type [employer]   ? Authorization Time Period 12 visits approved prior need for further request   ? Authorization - Visit Number 1   ? Authorization - Number of Visits 12   ? Progress Note Due on Visit 10   ? PT Start Time 1025   ? PT Stop Time 1105   ? PT Time Calculation (min) 40 min   ? Activity Tolerance Patient tolerated treatment well;Patient limited by fatigue   ? Behavior During Therapy [facility] for tasks assessed/performed   ? ?  ?  ? ?  ? ? ?Past Medical History:  ?Diagnosis Date  ? Acute blood loss anemia 09/02/2012  ? S/p 1 unit rbcs  ? Cellulitis   ? Erosive esophagitis 09/01/2012  ? NSAID induced.  ? Gall stones   ? Gastric ulcer with hemorrhage 09/02/2012  ? s/p bleeding control tx. Per Dr. Gala Romney  ? Gout   ? Obesity   ? UTI (lower urinary tract infection)   ? ?Past Surgical History:  ?Procedure Laterality Date  ? COLONOSCOPY WITH PROPOFOL N/A 07/30/2014  ? RMR: Colonoscopy anal canal/internal hemorrhoids, otherwise normal ileocolonoscoopy.   ? ESOPHAGOGASTRODUODENOSCOPY Left 09/01/2012  ? [id number] esophageal erosions consistent with erosive reflux esophagitis/Pre-pyloric benign-appearing gastric ulcer with bleeding stigmata status post bleeding control therapy as described above  ? ESOPHAGOGASTRODUODENOSCOPY N/A 12/13/2012  ? [id number] hernia. PReviously noted gastric ulcer completely healed.   ? ESOPHAGOGASTRODUODENOSCOPY (EGD) WITH PROPOFOL N/A 07/30/2014  ? Procedure: ESOPHAGOGASTRODUODENOSCOPY (EGD) WITH PROPOFOL;  Surgeon: Daneil Dolin, MD;  Location: [facility];  Service: Endoscopy;  Laterality: N/A;  ? TONSILLECTOMY    ? ?Patient Active Problem List  ? Diagnosis Date Noted  ? Gout 12/29/2017  ?  Tobacco abuse 12/29/2017  ? Acute hypoxemic respiratory failure ([place]) 12/29/2017  ? Bronchitis 12/29/2017  ? Rectal bleed 07/30/2014  ? Hiatal hernia   ? Hemorrhoid   ? Acute lower GI bleeding 07/29/2014  ? Abdominal pain   ? Wrist pain, acute   ? Tenosynovitis of finger, hand, or wrist   ? Cellulitis of hand 03/10/2014  ? Right upper quadrant abdominal pain 02/10/2014  ? Cholelithiasis 02/10/2014  ? Polyarthritis 11/08/2012  ? UTI (lower urinary tract infection) 10/21/2012  ? Cellulitis 09/17/2012  ? Tinea pedis 09/17/2012  ? Erosive esophagitis 09/07/2012  ? Hypokalemia 09/05/2012  ? Gastric ulcer with hemorrhage 09/02/2012  ? Upper GI bleed 09/02/2012  ? Acute blood loss anemia 09/02/2012  ? Hematemesis 09/01/2012  ? Normocytic anemia 09/01/2012  ? Acute gouty arthritis 09/01/2012  ? Morbid obesity ([place]) 09/01/2012  ? ? ?PCP: Neale Burly, MD ? ?REFERRING PROVIDER: Neale Burly, MD ? ?REFERRING DIAG: PT eval/tx for LBP  ? ?THERAPY DIAG:  ?Difficulty in walking, not elsewhere classified ? ?Bilateral leg weakness ? ?ONSET DATE: Chronic ? ?SUBJECTIVE:                                                                                                                                                                                          ? ?  SUBJECTIVE STATEMENT: ?Patient returns to therapy after DC 1 week ago for attendance. He has a new referral for LBP but he states his main issue is his ankles and weakness in his legs. He would like to start walking again. He he says he stopped walking initially due to problems with his ankle being "fused". He reports this is related to history of gout. He uses walker occasionally, but typically uses surroundings and furniture to stabilize himself when walking in home.  ? ?PERTINENT HISTORY:  ?Gout and Gastrointestinal hemorrhage ? ?PAIN:  ?Are you having pain? Yes: NPRS scale: 7/10 ?Pain location: Both feet ?Pain description: Sharp and tingling  ?Aggravating factors: pressure,  WB ?Relieving factors: non WB ? ? ?PRECAUTIONS: Fall ? ?WEIGHT BEARING RESTRICTIONS No ? ?FALLS:  ?Has patient fallen in last 6 months? Yes. Number of falls too many to count  ? ?LIVING ENVIRONMENT: ?Lives with: lives with their family ?Lives in: Mobile home ?Stairs: Yes: External: 3 steps; none ? ? ?OCCUPATION: Disability  ? ?PLOF: Independent with basic ADLs ? ?PATIENT GOALS Be able to walk more  ? ? ?OBJECTIVE:  ? ?DIAGNOSTIC FINDINGS:  ?NA ? ? ?COGNITION: ? Overall cognitive status: Within functional limits for tasks assessed   ? ? ? ?LUMBAR ROM:  ? ?Active  A/PROM  ?08/10/2021  ?Flexion 50% limited due to balance   ?Extension 100% limited (loses balance)   ?Right lateral flexion   ?Left lateral flexion   ?Right rotation   ?Left rotation   ? (Blank rows = not tested) ? ? ? ?LE MMT: Ankle DF limited by decreased AROM, hip extension tested in sidelying  ? ?MMT Right ?08/10/2021 Left ?08/10/2021  ?Hip flexion 5 5  ?Hip extension 4 4  ?Hip abduction 4 4+  ?Hip adduction    ?Hip internal rotation    ?Hip external rotation    ?Knee flexion    ?Knee extension 4+ 4+  ?Ankle dorsiflexion 3- 3-  ?Ankle plantarflexion    ?Ankle inversion    ?Ankle eversion    ? (Blank rows = not tested) ? ? ?GAIT: ?Distance walked: 8 feet ?Assistive device utilized:  holding onto mat ?Level of assistance: SBA ?Comments: Forward flexed trunk, fatigues quickly  ? ? ? ?TODAY'S TREATMENT  ?08/10/21 ?Seated calf stretch 3 x 10" using strap  ? ? ?PATIENT EDUCATION:  ?Education details: on evaluation findings, POC and HEP  ?Person educated: Patient ?Education method: Explanation ?Education comprehension: verbalized understanding ? ? ?HOME EXERCISE PROGRAM: ?Access Code: [email or web address] ?URL: [email or web address] ?Date: 08/10/2021 ?Prepared by: Josue Hector ? ?Exercises ?- Seated Calf Stretch with Strap  - 3 x daily - 7 x weekly - 1 sets - 5 reps - 20 second hold ?- Sit to Stand with Counter Support  - 3 x daily - 7 x weekly - 3 sets - 5  reps ?- Sidelying Hip Abduction  - 3 x daily - 7 x weekly - 2 sets - 10 reps ? ?ASSESSMENT: ? ?CLINICAL IMPRESSION: ?Patient is a 36 y.o. male who presents to physical therapy with complaint of LE weakness, leg and ankle pain. Patient demonstrates muscle weakness, reduced ROM, and decreased functional mobility and transfers which are likely contributing to symptoms of pain and are negatively impacting patient ability to perform ADLs and functional mobility tasks. Patient will benefit from skilled physical therapy services to address these deficits to reduce pain and improve level of function with ADLs and functional mobility tasks. ? ? ? ?OBJECTIVE IMPAIRMENTS Abnormal gait, decreased activity tolerance,  decreased balance, decreased endurance, decreased knowledge of use of DME, decreased mobility, difficulty walking, decreased ROM, decreased strength, improper body mechanics, obesity, and pain.  ? ?ACTIVITY LIMITATIONS cleaning, community activity, driving, meal prep, laundry, yard work, shopping, and yard work.  ? ?PERSONAL FACTORS Past/current experiences and    are also affecting patient's functional outcome.  ? ? ?REHAB POTENTIAL: Fair Past experience, obesity, chronicity  ? ?CLINICAL DECISION MAKING: Stable/uncomplicated ? ?EVALUATION COMPLEXITY: Low ? ? ?GOALS: ? ?SHORT TERM GOALS: Target date: 08/31/2021 ? ?Patient will be independent with initial HEP and self-management strategies to improve functional outcomes ?Baseline:  ?Goal status: INITIAL  ? ? ? ?LONG TERM GOALS: Target date: 09/21/2021 ? ?Patient will be independent with advanced HEP and self-management strategies to improve functional outcomes ?Baseline:  ?Goal status: INITIAL ? ?2.  Patient will be able to stand > 5 minutes with pain not to exceed 3/10 in lumbar to improve ability to perform cooking and grooming ADLs. ?Baseline: 1 minute ?Goal status: INITIAL ? ?3.  Patient will be able to ambulate at least 150 feet with LRAD to demonstrate improved  ability to perform functional mobility and associated tasks. ?Baseline: about 10 feet with HHA x 1 ?Goal status: INITIAL ? ?4. Patient will have equal to or > 4+/5 MMT throughout BLE to improve ability to

## 2021-08-12 ENCOUNTER — Encounter (HOSPITAL_COMMUNITY): Payer: Medicaid Other | Admitting: Physical Therapy

## 2021-08-16 ENCOUNTER — Ambulatory Visit (HOSPITAL_COMMUNITY): Payer: Medicaid Other | Attending: Internal Medicine | Admitting: Physical Therapy

## 2021-08-16 ENCOUNTER — Encounter (HOSPITAL_COMMUNITY): Payer: Medicaid Other | Admitting: Physical Therapy

## 2021-08-16 ENCOUNTER — Encounter (HOSPITAL_COMMUNITY): Payer: Self-pay | Admitting: Physical Therapy

## 2021-08-16 DIAGNOSIS — Z7409 Other reduced mobility: Secondary | ICD-10-CM | POA: Insufficient documentation

## 2021-08-16 DIAGNOSIS — Z789 Other specified health status: Secondary | ICD-10-CM | POA: Insufficient documentation

## 2021-08-16 DIAGNOSIS — R29898 Other symptoms and signs involving the musculoskeletal system: Secondary | ICD-10-CM

## 2021-08-16 DIAGNOSIS — R262 Difficulty in walking, not elsewhere classified: Secondary | ICD-10-CM | POA: Diagnosis present

## 2021-08-16 NOTE — Therapy (Signed)
?OUTPATIENT PHYSICAL THERAPY TREATMENT NOTE ? ? ?Patient Name: [name] ?MRN: [id number] ?DOB:1985/08/13, 36 y.o., male ?Today's Date: 08/16/2021 ? ?PCP: Toma Deiters, MD ?REFERRING PROVIDER: Toma Deiters, MD ? ?END OF SESSION:  ? PT End of Session - 08/16/21 1145   ? ? Visit Number 2   ? Number of Visits 12   ? Date for PT Re-Evaluation 09/21/21   ? Authorization Type [employer]   ? Authorization Time Period 12 visits approved prior need for further request   ? Authorization - Visit Number 2   ? Authorization - Number of Visits 12   ? Progress Note Due on Visit 10   ? PT Start Time 1141   arrives late  ? PT Stop Time 1215   ? PT Time Calculation (min) 34 min   ? Activity Tolerance Patient tolerated treatment well;Patient limited by fatigue   ? Behavior During Therapy [facility] for tasks assessed/performed   ? ?  ?  ? ?  ? ? ?Past Medical History:  ?Diagnosis Date  ? Acute blood loss anemia 09/02/2012  ? S/p 1 unit rbcs  ? Cellulitis   ? Erosive esophagitis 09/01/2012  ? NSAID induced.  ? Gall stones   ? Gastric ulcer with hemorrhage 09/02/2012  ? s/p bleeding control tx. Per Dr. Jena Gauss  ? Gout   ? Obesity   ? UTI (lower urinary tract infection)   ? ?Past Surgical History:  ?Procedure Laterality Date  ? COLONOSCOPY WITH PROPOFOL N/A 07/30/2014  ? RMR: Colonoscopy anal canal/internal hemorrhoids, otherwise normal ileocolonoscoopy.   ? ESOPHAGOGASTRODUODENOSCOPY Left 09/01/2012  ? [id number] esophageal erosions consistent with erosive reflux esophagitis/Pre-pyloric benign-appearing gastric ulcer with bleeding stigmata status post bleeding control therapy as described above  ? ESOPHAGOGASTRODUODENOSCOPY N/A 12/13/2012  ? [id number] hernia. PReviously noted gastric ulcer completely healed.   ? ESOPHAGOGASTRODUODENOSCOPY (EGD) WITH PROPOFOL N/A 07/30/2014  ? Procedure: ESOPHAGOGASTRODUODENOSCOPY (EGD) WITH PROPOFOL;  Surgeon: Corbin Ade, MD;  Location: [facility];  Service: Endoscopy;  Laterality: N/A;  ?  TONSILLECTOMY    ? ?Patient Active Problem List  ? Diagnosis Date Noted  ? Gout 12/29/2017  ? Tobacco abuse 12/29/2017  ? Acute hypoxemic respiratory failure ([facility]) 12/29/2017  ? Bronchitis 12/29/2017  ? Rectal bleed 07/30/2014  ? Hiatal hernia   ? Hemorrhoid   ? Acute lower GI bleeding 07/29/2014  ? Abdominal pain   ? Wrist pain, acute   ? Tenosynovitis of finger, hand, or wrist   ? Cellulitis of hand 03/10/2014  ? Right upper quadrant abdominal pain 02/10/2014  ? Cholelithiasis 02/10/2014  ? Polyarthritis 11/08/2012  ? UTI (lower urinary tract infection) 10/21/2012  ? Cellulitis 09/17/2012  ? Tinea pedis 09/17/2012  ? Erosive esophagitis 09/07/2012  ? Hypokalemia 09/05/2012  ? Gastric ulcer with hemorrhage 09/02/2012  ? Upper GI bleed 09/02/2012  ? Acute blood loss anemia 09/02/2012  ? Hematemesis 09/01/2012  ? Normocytic anemia 09/01/2012  ? Acute gouty arthritis 09/01/2012  ? Morbid obesity ([facility]) 09/01/2012  ? ? ?REFERRING DIAG: PT eval/tx for LBP  ? ?THERAPY DIAG:  ?Difficulty in walking, not elsewhere classified ? ?Bilateral leg weakness ? ?PERTINENT HISTORY: Gout and Gastrointestinal hemorrhage ? ?PRECAUTIONS: none ? ?SUBJECTIVE: Has been moving more at home. Exercises are going well.  ? ?PAIN:  ?Are you having pain? Yes: NPRS scale: 4 hands, legs 7/10 ?Pain location: feet/legs hands ?Pain description: sore ?Aggravating factors: movement ?Relieving factors: rest ? ? ?OBJECTIVE: (objective measures completed at initial evaluation unless  otherwise dated) ?  ?DIAGNOSTIC FINDINGS:  ?NA ?  ?  ?COGNITION: ?          Overall cognitive status: Within functional limits for tasks assessed               ?  ?  ?  ?LUMBAR ROM:  ?  ?Active  A/PROM  ?08/10/2021  ?Flexion 50% limited due to balance   ?Extension 100% limited (loses balance)   ?Right lateral flexion    ?Left lateral flexion    ?Right rotation    ?Left rotation    ? (Blank rows = not tested) ?  ?  ?  ?LE MMT: Ankle DF limited by decreased AROM, hip extension  tested in sidelying  ?  ?MMT Right ?08/10/2021 Left ?08/10/2021  ?Hip flexion 5 5  ?Hip extension 4 4  ?Hip abduction 4 4+  ?Hip adduction      ?Hip internal rotation      ?Hip external rotation      ?Knee flexion      ?Knee extension 4+ 4+  ?Ankle dorsiflexion 3- 3-  ?Ankle plantarflexion      ?Ankle inversion      ?Ankle eversion      ? (Blank rows = not tested) ?  ?  ?GAIT: ?Distance walked: 8 feet ?Assistive device utilized:  holding onto mat ?Level of assistance: SBA ?Comments: Forward flexed trunk, fatigues quickly  ?  ?  ?  ?TODAY'S TREATMENT  ?08/16/21 ?Bridge 2x10  ?SLR 2x 10 bilateral ?STS 2x 10  ?Step up 4 inch step 2x 10 RLE, 1x 10 LLE ?Hamstring curls 6 plates 2x 20 bilateral 3 second holds ? ?08/10/21 ?Seated calf stretch 3 x 10" using strap  ?  ?  ?PATIENT EDUCATION:  ?Education details:  HEP  ?Person educated: Patient ?Education method: Explanation ?Education comprehension: verbalized understanding ?  ?  ?HOME EXERCISE PROGRAM: ?Access Code: [email or web address] ?Date: 08/10/2021 ?- Seated Calf Stretch with Strap  - 3 x daily - 7 x weekly - 1 sets - 5 reps - 20 second hold ?- Sit to Stand with Counter Support  - 3 x daily - 7 x weekly - 3 sets - 5 reps ?- Sidelying Hip Abduction  - 3 x daily - 7 x weekly - 2 sets - 10 reps ?5/2 ?- Supine Bridge  - 1 x daily - 7 x weekly - 3 sets - 10 reps ?- Supine Straight Leg Raises  - 1 x daily - 7 x weekly - 3 sets - 10 reps ?  ?ASSESSMENT: ?  ?CLINICAL IMPRESSION: ?Began session with core exercises which are tolerated well. Patient with L knee pain with functional strengthening and is limited to 1 set of step exercises. He requires intermittent short rest breaks during session for fatigue. He is highly motivated to improve function and works hard during session. Patient will continue to benefit from physical therapy in order to improve function and reduce impairment. ? ?  ?  ?OBJECTIVE IMPAIRMENTS Abnormal gait, decreased activity tolerance, decreased balance, decreased  endurance, decreased knowledge of use of DME, decreased mobility, difficulty walking, decreased ROM, decreased strength, improper body mechanics, obesity, and pain.  ?  ?ACTIVITY LIMITATIONS cleaning, community activity, driving, meal prep, laundry, yard work, shopping, and yard work.  ?  ?PERSONAL FACTORS Past/current experiences and    are also affecting patient's functional outcome.  ?  ?  ?REHAB POTENTIAL: Fair Past experience, obesity, chronicity  ?  ?CLINICAL DECISION MAKING: Stable/uncomplicated ?  ?  EVALUATION COMPLEXITY: Low ?  ?  ?GOALS: ?  ?SHORT TERM GOALS: Target date: 08/31/2021 ?  ?Patient will be independent with initial HEP and self-management strategies to improve functional outcomes ?Baseline:  ?Goal status: ongoing ?  ?  ?  ?LONG TERM GOALS: Target date: 09/21/2021 ?  ?Patient will be independent with advanced HEP and self-management strategies to improve functional outcomes ?Baseline:  ?Goal status: ongoing ?  ?2.  Patient will be able to stand > 5 minutes with pain not to exceed 3/10 in lumbar to improve ability to perform cooking and grooming ADLs. ?Baseline: 1 minute ?Goal status: ongoing ?  ?3.  Patient will be able to ambulate at least 150 feet with LRAD to demonstrate improved ability to perform functional mobility and associated tasks. ?Baseline: about 10 feet with HHA x 1 ?Goal status: ongoing ?  ?4. Patient will have equal to or > 4+/5 MMT throughout BLE to improve ability to perform functional mobility, stair ambulation and ADLs.  ?Baseline: See MMTs ?Goal status: ongoing ?  ?  ?  ?PLAN: ?PT FREQUENCY: 2x/week ?  ?PT DURATION: 6 weeks ?  ?PLANNED INTERVENTIONS: Therapeutic exercises, Therapeutic activity, Neuromuscular re-education, Balance training, Gait training, Patient/Family education, Joint manipulation, Joint mobilization, Stair training, Aquatic Therapy, Dry Needling, Electrical stimulation, Spinal manipulation, Spinal mobilization, Cryotherapy, Moist heat, scar mobilization,  Taping, Traction, Ultrasound, Biofeedback, Ionotophoresis 4mg /ml Dexamethasone, and Manual therapy. ?. ?  ?PLAN FOR NEXT SESSION: LE strength and ankle mobility. Progress to standing, posture, core strength, gait and bala

## 2021-08-23 ENCOUNTER — Encounter (HOSPITAL_COMMUNITY): Payer: Medicaid Other
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
## 2021-08-25 ENCOUNTER — Ambulatory Visit (HOSPITAL_COMMUNITY): Payer: Medicaid Other

## 2021-08-25 ENCOUNTER — Encounter (HOSPITAL_COMMUNITY): Payer: Self-pay

## 2021-08-25 DIAGNOSIS — R262 Difficulty in walking, not elsewhere classified: Secondary | ICD-10-CM | POA: Diagnosis not present

## 2021-08-25 DIAGNOSIS — Z789 Other specified health status: Secondary | ICD-10-CM

## 2021-08-25 DIAGNOSIS — Z7409 Other reduced mobility: Secondary | ICD-10-CM

## 2021-08-25 DIAGNOSIS — R29898 Other symptoms and signs involving the musculoskeletal system: Secondary | ICD-10-CM

## 2021-08-25 NOTE — Therapy (Signed)
?OUTPATIENT PHYSICAL THERAPY TREATMENT NOTE ? ? ?Patient Name: [name] ?MRN: [id number] ?DOB:27-Jan-1986, 36 y.o., male ?Today's Date: 08/25/2021 ? ?PCP: Toma Deiters, MD ?REFERRING PROVIDER: Toma Deiters, MD ? ?END OF SESSION:  ? PT End of Session - 08/25/21 0924   ? ? Visit Number 3   ? Number of Visits 12   ? Date for PT Re-Evaluation 09/21/21   ? Authorization Type [place] [employer]   ? Authorization Time Period 12 visits approved prior need for further request   ? Authorization - Visit Number 3   ? Authorization - Number of Visits 12   ? Progress Note Due on Visit 10   ? PT Start Time [phone]   ? PT Stop Time 0959   ? PT Time Calculation (min) 40 min   ? Activity Tolerance Patient tolerated treatment well;Patient limited by fatigue   ? Behavior During Therapy [facility] for tasks assessed/performed   ? ?  ?  ? ?  ? ? ? ?Past Medical History:  ?Diagnosis Date  ? Acute blood loss anemia 09/02/2012  ? S/p 1 unit rbcs  ? Cellulitis   ? Erosive esophagitis 09/01/2012  ? NSAID induced.  ? Gall stones   ? Gastric ulcer with hemorrhage 09/02/2012  ? s/p bleeding control tx. Per Dr. Jena Gauss  ? Gout   ? Obesity   ? UTI (lower urinary tract infection)   ? ?Past Surgical History:  ?Procedure Laterality Date  ? COLONOSCOPY WITH PROPOFOL N/A 07/30/2014  ? RMR: Colonoscopy anal canal/internal hemorrhoids, otherwise normal ileocolonoscoopy.   ? ESOPHAGOGASTRODUODENOSCOPY Left 09/01/2012  ? GYI:RSWNIO esophageal erosions consistent with erosive reflux esophagitis/Pre-pyloric benign-appearing gastric ulcer with bleeding stigmata status post bleeding control therapy as described above  ? ESOPHAGOGASTRODUODENOSCOPY N/A 12/13/2012  ? EVO:[id number] hernia. PReviously noted gastric ulcer completely healed.   ? ESOPHAGOGASTRODUODENOSCOPY (EGD) WITH PROPOFOL N/A 07/30/2014  ? Procedure: ESOPHAGOGASTRODUODENOSCOPY (EGD) WITH PROPOFOL;  Surgeon: Corbin Ade, MD;  Location: [facility];  Service: Endoscopy;  Laterality: N/A;  ? TONSILLECTOMY     ? ?Patient Active Problem List  ? Diagnosis Date Noted  ? Gout 12/29/2017  ? Tobacco abuse 12/29/2017  ? Acute hypoxemic respiratory failure (HCC) 12/29/2017  ? Bronchitis 12/29/2017  ? Rectal bleed 07/30/2014  ? Hiatal hernia   ? Hemorrhoid   ? Acute lower GI bleeding 07/29/2014  ? Abdominal pain   ? Wrist pain, acute   ? Tenosynovitis of finger, hand, or wrist   ? Cellulitis of hand 03/10/2014  ? Right upper quadrant abdominal pain 02/10/2014  ? Cholelithiasis 02/10/2014  ? Polyarthritis 11/08/2012  ? UTI (lower urinary tract infection) 10/21/2012  ? Cellulitis 09/17/2012  ? Tinea pedis 09/17/2012  ? Erosive esophagitis 09/07/2012  ? Hypokalemia 09/05/2012  ? Gastric ulcer with hemorrhage 09/02/2012  ? Upper GI bleed 09/02/2012  ? Acute blood loss anemia 09/02/2012  ? Hematemesis 09/01/2012  ? Normocytic anemia 09/01/2012  ? Acute gouty arthritis 09/01/2012  ? Morbid obesity (HCC) 09/01/2012  ? ? ?REFERRING DIAG: PT eval/tx for LBP  ? ?THERAPY DIAG:  ?Difficulty in walking, not elsewhere classified ? ?Bilateral leg weakness ? ?Impaired mobility and ADLs ? ?Impaired functional mobility and endurance ? ?PERTINENT HISTORY: Gout and Gastrointestinal hemorrhage ? ?PRECAUTIONS: none ? ?SUBJECTIVE: Pt reports he slipped and fell on Rt arm, unable to move thumb.  Pt thinks he may have some gout.  Reoprts he was able to drive and load wheelchair into car by himself today ? ?PAIN:  ?  Are you having pain? Yes: NPRS scale: 6 hands, feet 6/10 ?Pain location: feet/legs hands ?Pain description: sore ?Aggravating factors: movement ?Relieving factors: rest ? ? ?OBJECTIVE: (objective measures completed at initial evaluation unless otherwise dated) ?  ?DIAGNOSTIC FINDINGS:  ?NA ?  ?  ?COGNITION: ?          Overall cognitive status: Within functional limits for tasks assessed               ?  ?  ?  ?LUMBAR ROM:  ?  ?Active  A/PROM  ?08/10/2021  ?Flexion 50% limited due to balance   ?Extension 100% limited (loses balance)   ?Right  lateral flexion    ?Left lateral flexion    ?Right rotation    ?Left rotation    ? (Blank rows = not tested) ?  ?  ?  ?LE MMT: Ankle DF limited by decreased AROM, hip extension tested in sidelying  ?  ?MMT Right ?08/10/2021 Left ?08/10/2021  ?Hip flexion 5 5  ?Hip extension 4 4  ?Hip abduction 4 4+  ?Hip adduction      ?Hip internal rotation      ?Hip external rotation      ?Knee flexion      ?Knee extension 4+ 4+  ?Ankle dorsiflexion 3- 3-  ?Ankle plantarflexion      ?Ankle inversion      ?Ankle eversion      ? (Blank rows = not tested) ?  ?  ?GAIT: ?Distance walked: 8 feet ?Assistive device utilized:  holding onto mat ?Level of assistance: SBA ?Comments: Forward flexed trunk, fatigues quickly  ?  ?  ?  ?TODAY'S TREATMENT  ?08/25/21 ? 69ft  ?Gait training no AD 104 prior rest break WC behind ?Sidestep down long hallway 1RT each direction, WC behind ?Alternating marching 10x3" ?Tandem stance 2x 30" ?10 STS ? ?08/16/21 ?Bridge 2x10  ?SLR 2x 10 bilateral ?STS 2x 10  ?Step up 4 inch step 2x 10 RLE, 1x 10 LLE ?Hamstring curls 6 plates 2x 20 bilateral 3 second holds ? ?08/10/21 ?Seated calf stretch 3 x 10" using strap  ?  ?  ?PATIENT EDUCATION:  ?Education details:  HEP  ?Person educated: Patient ?Education method: Explanation ?Education comprehension: verbalized understanding ?  ?  ?HOME EXERCISE PROGRAM: ?Access Code: [email or web address] ?Date: 08/10/2021 ?- Seated Calf Stretch with Strap  - 3 x daily - 7 x weekly - 1 sets - 5 reps - 20 second hold ?- Sit to Stand with Counter Support  - 3 x daily - 7 x weekly - 3 sets - 5 reps ?- Sidelying Hip Abduction  - 3 x daily - 7 x weekly - 2 sets - 10 reps ?5/2 ?- Supine Bridge  - 1 x daily - 7 x weekly - 3 sets - 10 reps ?- Supine Straight Leg Raises  - 1 x daily - 7 x weekly - 3 sets - 10 reps ?  ?ASSESSMENT: ?  ?CLINICAL IMPRESSION: ? Added balance activities following report of fall earlier today.  Pt presents with improved activity tolerance with vast improvements in distance with  gait training.  Pt able to complete 29ft during .  Then able to ambulate 130ft prior fatigue, presents with antalgic gait mechanics, forward flexed stance and impaired mobility with ankle and legs in WBOS, no LOB during gait today, therapist did follow with WC for safety.  EOS pt limited by fatigue, therapist assisted placing WC into SUV for safety.  Monitored pain through session,  encouraged to see MD attention if continues to be limited mobility/pain with arm. ?  ?OBJECTIVE IMPAIRMENTS Abnormal gait, decreased activity tolerance, decreased balance, decreased endurance, decreased knowledge of use of DME, decreased mobility, difficulty walking, decreased ROM, decreased strength, improper body mechanics, obesity, and pain.  ?  ?ACTIVITY LIMITATIONS cleaning, community activity, driving, meal prep, laundry, yard work, shopping, and yard work.  ?  ?PERSONAL FACTORS Past/current experiences and    are also affecting patient's functional outcome.  ?  ?  ?REHAB POTENTIAL: Fair Past experience, obesity, chronicity  ?  ?CLINICAL DECISION MAKING: Stable/uncomplicated ?  ?EVALUATION COMPLEXITY: Low ?  ?  ?GOALS: ?  ?SHORT TERM GOALS: Target date: 08/31/2021 ?  ?Patient will be independent with initial HEP and self-management strategies to improve functional outcomes ?Baseline:  ?Goal status: ongoing ?  ?  ?  ?LONG TERM GOALS: Target date: 09/21/2021 ?  ?Patient will be independent with advanced HEP and self-management strategies to improve functional outcomes ?Baseline:  ?Goal status: ongoing ?  ?2.  Patient will be able to stand > 5 minutes with pain not to exceed 3/10 in lumbar to improve ability to perform cooking and grooming ADLs. ?Baseline: 1 minute ?Goal status: ongoing ?  ?3.  Patient will be able to ambulate at least 150 feet with LRAD to demonstrate improved ability to perform functional mobility and associated tasks. ?Baseline: about 10 feet with HHA x 1 ?Goal status: ongoing ?  ?4. Patient will have equal to or >  4+/5 MMT throughout BLE to improve ability to perform functional mobility, stair ambulation and ADLs.  ?Baseline: See MMTs ?Goal status: ongoing ?  ?  ?  ?PLAN: ?PT FREQUENCY: 2x/week ?  ?PT DURATION: 6 we

## 2021-08-30 ENCOUNTER — Encounter (HOSPITAL_COMMUNITY): Payer: Self-pay | Admitting: Physical Therapy

## 2021-08-30 ENCOUNTER — Ambulatory Visit (HOSPITAL_COMMUNITY): Payer: Medicaid Other | Admitting: Physical Therapy

## 2021-08-30 DIAGNOSIS — R262 Difficulty in walking, not elsewhere classified: Secondary | ICD-10-CM

## 2021-08-30 DIAGNOSIS — R29898 Other symptoms and signs involving the musculoskeletal system: Secondary | ICD-10-CM

## 2021-08-30 NOTE — Therapy (Signed)
?OUTPATIENT PHYSICAL THERAPY TREATMENT NOTE ? ? ?Patient Name: [name] ?MRN: [id number] ?DOB:1985-09-06, 36 y.o., male ?Today's Date: 08/30/2021 ? ?PCP: Toma Deiters, MD ?REFERRING PROVIDER: Toma Deiters, MD ? ?END OF SESSION:  ? PT End of Session - 08/30/21 0903   ? ? Visit Number 4   ? Number of Visits 12   ? Date for PT Re-Evaluation 09/21/21   ? Authorization Type [place] [employer]   ? Authorization Time Period 12 visits approved prior need for further request   ? Authorization - Visit Number 4   ? Authorization - Number of Visits 12   ? Progress Note Due on Visit 10   ? PT Start Time [phone]   ? PT Stop Time 0944   ? PT Time Calculation (min) 39 min   ? Activity Tolerance Patient tolerated treatment well;Patient limited by fatigue   ? Behavior During Therapy [facility] for tasks assessed/performed   ? ?  ?  ? ?  ? ? ? ?Past Medical History:  ?Diagnosis Date  ? Acute blood loss anemia 09/02/2012  ? S/p 1 unit rbcs  ? Cellulitis   ? Erosive esophagitis 09/01/2012  ? NSAID induced.  ? Gall stones   ? Gastric ulcer with hemorrhage 09/02/2012  ? s/p bleeding control tx. Per Dr. Jena Gauss  ? Gout   ? Obesity   ? UTI (lower urinary tract infection)   ? ?Past Surgical History:  ?Procedure Laterality Date  ? COLONOSCOPY WITH PROPOFOL N/A 07/30/2014  ? RMR: Colonoscopy anal canal/internal hemorrhoids, otherwise normal ileocolonoscoopy.   ? ESOPHAGOGASTRODUODENOSCOPY Left 09/01/2012  ? YYQ:MGNOIB esophageal erosions consistent with erosive reflux esophagitis/Pre-pyloric benign-appearing gastric ulcer with bleeding stigmata status post bleeding control therapy as described above  ? ESOPHAGOGASTRODUODENOSCOPY N/A 12/13/2012  ? BCW:[id number] hernia. PReviously noted gastric ulcer completely healed.   ? ESOPHAGOGASTRODUODENOSCOPY (EGD) WITH PROPOFOL N/A 07/30/2014  ? Procedure: ESOPHAGOGASTRODUODENOSCOPY (EGD) WITH PROPOFOL;  Surgeon: Corbin Ade, MD;  Location: [facility];  Service: Endoscopy;  Laterality: N/A;  ? TONSILLECTOMY     ? ?Patient Active Problem List  ? Diagnosis Date Noted  ? Gout 12/29/2017  ? Tobacco abuse 12/29/2017  ? Acute hypoxemic respiratory failure (HCC) 12/29/2017  ? Bronchitis 12/29/2017  ? Rectal bleed 07/30/2014  ? Hiatal hernia   ? Hemorrhoid   ? Acute lower GI bleeding 07/29/2014  ? Abdominal pain   ? Wrist pain, acute   ? Tenosynovitis of finger, hand, or wrist   ? Cellulitis of hand 03/10/2014  ? Right upper quadrant abdominal pain 02/10/2014  ? Cholelithiasis 02/10/2014  ? Polyarthritis 11/08/2012  ? UTI (lower urinary tract infection) 10/21/2012  ? Cellulitis 09/17/2012  ? Tinea pedis 09/17/2012  ? Erosive esophagitis 09/07/2012  ? Hypokalemia 09/05/2012  ? Gastric ulcer with hemorrhage 09/02/2012  ? Upper GI bleed 09/02/2012  ? Acute blood loss anemia 09/02/2012  ? Hematemesis 09/01/2012  ? Normocytic anemia 09/01/2012  ? Acute gouty arthritis 09/01/2012  ? Morbid obesity (HCC) 09/01/2012  ? ? ?REFERRING DIAG: PT eval/tx for LBP  ? ?THERAPY DIAG:  ?Difficulty in walking, not elsewhere classified ? ?Bilateral leg weakness ? ?PERTINENT HISTORY: Gout and Gastrointestinal hemorrhage ? ?PRECAUTIONS: none ? ?SUBJECTIVE: Patient says his shoulder is sore. He slipped and fell in the shower on Saturday.  ? ?PAIN:  ?Are you having pain? Yes: NPRS scale: 6/10 ?Pain location: feet/legs , RT shoulder  ?Pain description: sore ?Aggravating factors: movement ?Relieving factors: rest ? ? ?OBJECTIVE: (objective measures completed at  initial evaluation unless otherwise dated) ?  ?DIAGNOSTIC FINDINGS:  ?NA ?  ?  ?COGNITION: ?          Overall cognitive status: Within functional limits for tasks assessed               ?  ?  ?  ?LUMBAR ROM:  ?  ?Active  A/PROM  ?08/10/2021  ?Flexion 50% limited due to balance   ?Extension 100% limited (loses balance)   ?Right lateral flexion    ?Left lateral flexion    ?Right rotation    ?Left rotation    ? (Blank rows = not tested) ?  ?  ?  ?LE MMT: Ankle DF limited by decreased AROM, hip extension  tested in sidelying  ?  ?MMT Right ?08/10/2021 Left ?08/10/2021  ?Hip flexion 5 5  ?Hip extension 4 4  ?Hip abduction 4 4+  ?Hip adduction      ?Hip internal rotation      ?Hip external rotation      ?Knee flexion      ?Knee extension 4+ 4+  ?Ankle dorsiflexion 3- 3-  ?Ankle plantarflexion      ?Ankle inversion      ?Ankle eversion      ? (Blank rows = not tested) ?  ?  ?GAIT: ?Distance walked: 8 feet ?Assistive device utilized:  holding onto mat ?Level of assistance: SBA ?Comments: Forward flexed trunk, fatigues quickly  ?  ?  ?  ?TODAY'S TREATMENT  ?08/30/21 ?Nustep Lv 5 (seat 13)  for strength and ROM  5 min  ?Standing march 2 x 10 ?Standing hip extension 2 x 10 ?Sidestepping in hallway 20 feet 2 RT ?Gait in hallway 130 feet int support from wall ?Stairs 7 inch 2 RT, step to pattern, both hand rails  ?Seated hamstring curl 5 plates 3 x 20 each  ? ?08/25/21 ?2MWT 6836ft  ?Gait training no AD 104 prior rest break WC behind ?Sidestep down long hallway 1RT each direction, WC behind ?Alternating marching 10x3" ?Tandem stance 2x 30" ?10 STS ? ?08/16/21 ?Bridge 2x10  ?SLR 2x 10 bilateral ?STS 2x 10  ?Step up 4 inch step 2x 10 RLE, 1x 10 LLE ?Hamstring curls 6 plates 2x 20 bilateral 3 second holds ? ?08/10/21 ?Seated calf stretch 3 x 10" using strap  ?  ?  ?PATIENT EDUCATION:  ?Education details:  HEP  ?Person educated: Patient ?Education method: Explanation ?Education comprehension: verbalized understanding ?  ?  ?HOME EXERCISE PROGRAM: ?Access Code: [email or web address] ?Date: 08/10/2021 ?- Seated Calf Stretch with Strap  - 3 x daily - 7 x weekly - 1 sets - 5 reps - 20 second hold ?- Sit to Stand with Counter Support  - 3 x daily - 7 x weekly - 3 sets - 5 reps ?- Sidelying Hip Abduction  - 3 x daily - 7 x weekly - 2 sets - 10 reps ?5/2 ?- Supine Bridge  - 1 x daily - 7 x weekly - 3 sets - 10 reps ?- Supine Straight Leg Raises  - 1 x daily - 7 x weekly - 3 sets - 10 reps ?  ?ASSESSMENT: ?  ?CLINICAL IMPRESSION: ?Patient did well today. He  is limited by fatigue and does require some breaks for rest throughout session but was able to increase distance ambulated. Also added stairs today. Patient with significant deviations, but able to ambulate 7 inch step safely with LOB. Heavy use of Ues for support, and noted quad weakness. Patient will  continue to benefit from skilled therapy services to reduce remaining deficits and improve functional ability.  ? ?  ?OBJECTIVE IMPAIRMENTS Abnormal gait, decreased activity tolerance, decreased balance, decreased endurance, decreased knowledge of use of DME, decreased mobility, difficulty walking, decreased ROM, decreased strength, improper body mechanics, obesity, and pain.  ?  ?ACTIVITY LIMITATIONS cleaning, community activity, driving, meal prep, laundry, yard work, shopping, and yard work.  ?  ?PERSONAL FACTORS Past/current experiences and    are also affecting patient's functional outcome.  ?  ?  ?REHAB POTENTIAL: Fair Past experience, obesity, chronicity  ?  ?CLINICAL DECISION MAKING: Stable/uncomplicated ?  ?EVALUATION COMPLEXITY: Low ?  ?  ?GOALS: ?  ?SHORT TERM GOALS: Target date: 08/31/2021 ?  ?Patient will be independent with initial HEP and self-management strategies to improve functional outcomes ?Baseline:  ?Goal status: ongoing ?  ?  ?  ?LONG TERM GOALS: Target date: 09/21/2021 ?  ?Patient will be independent with advanced HEP and self-management strategies to improve functional outcomes ?Baseline:  ?Goal status: ongoing ?  ?2.  Patient will be able to stand > 5 minutes with pain not to exceed 3/10 in lumbar to improve ability to perform cooking and grooming ADLs. ?Baseline: 1 minute ?Goal status: ongoing ?  ?3.  Patient will be able to ambulate at least 150 feet with LRAD to demonstrate improved ability to perform functional mobility and associated tasks. ?Baseline: about 10 feet with HHA x 1 ?Goal status: ongoing ?  ?4. Patient will have equal to or > 4+/5 MMT throughout BLE to improve ability to  perform functional mobility, stair ambulation and ADLs.  ?Baseline: See MMTs ?Goal status: ongoing ?  ?  ?  ?PLAN: ?PT FREQUENCY: 2x/week ?  ?PT DURATION: 6 weeks ?  ?PLANNED INTERVENTIONS: Therapeutic exe

## 2021-09-06 ENCOUNTER — Ambulatory Visit (HOSPITAL_COMMUNITY): Payer: Medicaid Other | Admitting: Physical Therapy

## 2021-09-06 DIAGNOSIS — R262 Difficulty in walking, not elsewhere classified: Secondary | ICD-10-CM

## 2021-09-06 DIAGNOSIS — Z789 Other specified health status: Secondary | ICD-10-CM

## 2021-09-06 DIAGNOSIS — R29898 Other symptoms and signs involving the musculoskeletal system: Secondary | ICD-10-CM

## 2021-09-06 NOTE — Therapy (Signed)
OUTPATIENT PHYSICAL THERAPY TREATMENT NOTE   Patient Name: [name] MRN: [id number] DOB:12-13-1985, 36 y.o., male Today's Date: 09/06/2021  PCP: Toma Deiters, MD REFERRING PROVIDER: Toma Deiters, MD  END OF SESSION:   PT End of Session - 09/06/21 1145     Visit Number 5    Number of Visits 12    Date for PT Re-Evaluation 09/21/21    Authorization Type [place] [employer]    Authorization Time Period 12 visits approved prior need for further request    Authorization - Visit Number 5    Authorization - Number of Visits 12    Progress Note Due on Visit 10    PT Start Time 1140    PT Stop Time 1220    PT Time Calculation (min) 40 min    Activity Tolerance Patient tolerated treatment well;Patient limited by fatigue    Behavior During Therapy [facility] for tasks assessed/performed              Past Medical History:  Diagnosis Date   Acute blood loss anemia 09/02/2012   S/p 1 unit rbcs   Cellulitis    Erosive esophagitis 09/01/2012   NSAID induced.   Gall stones    Gastric ulcer with hemorrhage 09/02/2012   s/p bleeding control tx. Per Dr. Jena Gauss   Gout    Obesity    UTI (lower urinary tract infection)    Past Surgical History:  Procedure Laterality Date   COLONOSCOPY WITH PROPOFOL N/A 07/30/2014   RMR: Colonoscopy anal canal/internal hemorrhoids, otherwise normal ileocolonoscoopy.    ESOPHAGOGASTRODUODENOSCOPY Left 09/01/2012   XBJ:[id number] esophageal erosions consistent with erosive reflux esophagitis/Pre-pyloric benign-appearing gastric ulcer with bleeding stigmata status post bleeding control therapy as described above   ESOPHAGOGASTRODUODENOSCOPY N/A 12/13/2012   AOZ:HYQMVH hernia. PReviously noted gastric ulcer completely healed.    ESOPHAGOGASTRODUODENOSCOPY (EGD) WITH PROPOFOL N/A 07/30/2014   Procedure: ESOPHAGOGASTRODUODENOSCOPY (EGD) WITH PROPOFOL;  Surgeon: Corbin Ade, MD;  Location: [facility];  Service: Endoscopy;  Laterality: N/A;   TONSILLECTOMY      Patient Active Problem List   Diagnosis Date Noted   Gout 12/29/2017   Tobacco abuse 12/29/2017   Acute hypoxemic respiratory failure (HCC) 12/29/2017   Bronchitis 12/29/2017   Rectal bleed 07/30/2014   Hiatal hernia    Hemorrhoid    Acute lower GI bleeding 07/29/2014   Abdominal pain    Wrist pain, acute    Tenosynovitis of finger, hand, or wrist    Cellulitis of hand 03/10/2014   Right upper quadrant abdominal pain 02/10/2014   Cholelithiasis 02/10/2014   Polyarthritis 11/08/2012   UTI (lower urinary tract infection) 10/21/2012   Cellulitis 09/17/2012   Tinea pedis 09/17/2012   Erosive esophagitis 09/07/2012   Hypokalemia 09/05/2012   Gastric ulcer with hemorrhage 09/02/2012   Upper GI bleed 09/02/2012   Acute blood loss anemia 09/02/2012   Hematemesis 09/01/2012   Normocytic anemia 09/01/2012   Acute gouty arthritis 09/01/2012   Morbid obesity (HCC) 09/01/2012    REFERRING DIAG: PT eval/tx for LBP   THERAPY DIAG:  Difficulty in walking, not elsewhere classified  Bilateral leg weakness  Impaired mobility and ADLs  PERTINENT HISTORY: Gout and Gastrointestinal hemorrhage  PRECAUTIONS: none  SUBJECTIVE: Patient states he is doing well today. No pain or issues   PAIN:  Are you having pain? Yes: NPRS scale: 6/10 Pain location: feet/legs , RT shoulder  Pain description: sore Aggravating factors: movement Relieving factors: rest   OBJECTIVE: (objective measures completed  at initial evaluation unless otherwise dated)   DIAGNOSTIC FINDINGS:  NA     COGNITION:           Overall cognitive status: Within functional limits for tasks assessed                     LUMBAR ROM:    Active  A/PROM  08/10/2021  Flexion 50% limited due to balance   Extension 100% limited (loses balance)   Right lateral flexion    Left lateral flexion    Right rotation    Left rotation     (Blank rows = not tested)       LE MMT: Ankle DF limited by decreased AROM, hip  extension tested in sidelying    MMT Right 08/10/2021 Left 08/10/2021  Hip flexion 5 5  Hip extension 4 4  Hip abduction 4 4+  Hip adduction      Hip internal rotation      Hip external rotation      Knee flexion      Knee extension 4+ 4+  Ankle dorsiflexion 3- 3-  Ankle plantarflexion      Ankle inversion      Ankle eversion       (Blank rows = not tested)     GAIT: Distance walked: 8 feet Assistive device utilized:  holding onto mat Level of assistance: SBA Comments: Forward flexed trunk, fatigues quickly        TODAY'S TREATMENT  09/06/21 Gait in hallway 180 feet total (2 bouts of 80 feet) no AD Nustep Lv 5 (seat 13)  for strength and ROM  5 min  Standing march 2 x 10 Standing hip extension 2 x 10 Sidestepping on blue line (32 feet RT) 2 RT Seated hamstring curl 7 PL 3 x 20 each with bodycraft pulleys Seated quadriceps extension 7PL 2X20 each with bodycraft pulleys  08/30/21 Nustep Lv 5 (seat 13)  for strength and ROM  5 min  Standing march 2 x 10 Standing hip extension 2 x 10 Sidestepping in hallway 20 feet 2 RT Gait in hallway 130 feet int support from wall Stairs 7 inch 2 RT, step to pattern, both hand rails  Seated hamstring curl 5 plates 3 x 20 each   08/25/21 33ft  Gait training no AD 104 prior rest break WC behind Sidestep down long hallway 1RT each direction, WC behind Alternating marching 10x3" Tandem stance 2x 30" 10 STS  08/16/21 Bridge 2x10  SLR 2x 10 bilateral STS 2x 10  Step up 4 inch step 2x 10 RLE, 1x 10 LLE Hamstring curls 6 plates 2x 20 bilateral 3 second holds  08/10/21 Seated calf stretch 3 x 10" using strap      PATIENT EDUCATION:  Education details:  HEP  Person educated: Patient Education method: Explanation Education comprehension: verbalized understanding     HOME EXERCISE PROGRAM: Access Code: [email or web address] Date: 08/10/2021 - Seated Calf Stretch with Strap  - 3 x daily - 7 x weekly - 1 sets - 5 reps - 20 second hold -  Sit to Stand with Counter Support  - 3 x daily - 7 x weekly - 3 sets - 5 reps - Sidelying Hip Abduction  - 3 x daily - 7 x weekly - 2 sets - 10 reps 5/2 - Supine Bridge  - 1 x daily - 7 x weekly - 3 sets - 10 reps - Supine Straight Leg Raises  - 1 x daily -  7 x weekly - 3 sets - 10 reps   ASSESSMENT:   CLINICAL IMPRESSION: Patient continues to push himself with strengthening/challenges.  Fatigue and Lt knee pain limits his activities needing rest breaks throughout session. Pt able to increased resistance of hamstring Curls to 7PL this session.  Added quad work using pulley system as well using 7PL with noted challenge/burn.  Requested to hold stairs due to fatigue/having to negotiate when gets home today.   Patient will continue to benefit from skilled therapy services to reduce remaining deficits and improve functional ability.     OBJECTIVE IMPAIRMENTS Abnormal gait, decreased activity tolerance, decreased balance, decreased endurance, decreased knowledge of use of DME, decreased mobility, difficulty walking, decreased ROM, decreased strength, improper body mechanics, obesity, and pain.    ACTIVITY LIMITATIONS cleaning, community activity, driving, meal prep, laundry, yard work, shopping, and yard work.    PERSONAL FACTORS Past/current experiences and    are also affecting patient's functional outcome.      REHAB POTENTIAL: Fair Past experience, obesity, chronicity    CLINICAL DECISION MAKING: Stable/uncomplicated   EVALUATION COMPLEXITY: Low     GOALS:   SHORT TERM GOALS: Target date: 08/31/2021   Patient will be independent with initial HEP and self-management strategies to improve functional outcomes Baseline:  Goal status: ongoing       LONG TERM GOALS: Target date: 09/21/2021   Patient will be independent with advanced HEP and self-management strategies to improve functional outcomes Baseline:  Goal status: ongoing   2.  Patient will be able to stand > 5 minutes with  pain not to exceed 3/10 in lumbar to improve ability to perform cooking and grooming ADLs. Baseline: 1 minute Goal status: ongoing   3.  Patient will be able to ambulate at least 150 feet with LRAD to demonstrate improved ability to perform functional mobility and associated tasks. Baseline: about 10 feet with HHA x 1 Goal status: ongoing   4. Patient will have equal to or > 4+/5 MMT throughout BLE to improve ability to perform functional mobility, stair ambulation and ADLs.  Baseline: See MMTs Goal status: ongoing       PLAN: PT FREQUENCY: 2x/week   PT DURATION: 6 weeks   PLANNED INTERVENTIONS: Therapeutic exercises, Therapeutic activity, Neuromuscular re-education, Balance training, Gait training, Patient/Family education, Joint manipulation, Joint mobilization, Stair training, Aquatic Therapy, Dry Needling, Electrical stimulation, Spinal manipulation, Spinal mobilization, Cryotherapy, Moist heat, scar mobilization, Taping, Traction, Ultrasound, Biofeedback, Ionotophoresis 4mg /ml Dexamethasone, and Manual therapy. Marland Kitchen.   PLAN FOR NEXT SESSION: LE strength and ankle mobility. Progress standing activities, posture, core strength, gait and balance.   Attempt standing theraband rows, extension next session.   11:46 AM, 09/06/21 Lurena NidaAmy B Daneisha Surges, PTA/CLT [facility] [place] Ph: [phone]

## 2021-09-08 ENCOUNTER — Encounter (HOSPITAL_COMMUNITY): Payer: Self-pay

## 2021-09-08 ENCOUNTER — Ambulatory Visit (HOSPITAL_COMMUNITY): Payer: Medicaid Other

## 2021-09-08 DIAGNOSIS — Z7409 Other reduced mobility: Secondary | ICD-10-CM

## 2021-09-08 DIAGNOSIS — R262 Difficulty in walking, not elsewhere classified: Secondary | ICD-10-CM

## 2021-09-08 DIAGNOSIS — R29898 Other symptoms and signs involving the musculoskeletal system: Secondary | ICD-10-CM

## 2021-09-08 NOTE — Therapy (Signed)
OUTPATIENT PHYSICAL THERAPY TREATMENT NOTE   Patient Name: [name] MRN: [id number] DOB:Nov 10, 1985, 36 y.o., male Today's Date: 09/08/2021  PCP: Toma Deiters, MD REFERRING PROVIDER: Toma Deiters, MD  END OF SESSION:   PT End of Session - 09/08/21 0951     Visit Number 6    Number of Visits 12    Date for PT Re-Evaluation 09/21/21    Authorization Type [place] [employer]    Authorization Time Period 12 visits approved prior need for further request    Authorization - Visit Number 6    Authorization - Number of Visits 12    Progress Note Due on Visit 10    PT Start Time 0917    PT Stop Time 1003    PT Time Calculation (min) 46 min    Activity Tolerance Patient limited by pain;Patient tolerated treatment well;Patient limited by fatigue    Behavior During Therapy [facility] for tasks assessed/performed               Past Medical History:  Diagnosis Date   Acute blood loss anemia 09/02/2012   S/p 1 unit rbcs   Cellulitis    Erosive esophagitis 09/01/2012   NSAID induced.   Gall stones    Gastric ulcer with hemorrhage 09/02/2012   s/p bleeding control tx. Per Dr. Jena Gauss   Gout    Obesity    UTI (lower urinary tract infection)    Past Surgical History:  Procedure Laterality Date   COLONOSCOPY WITH PROPOFOL N/A 07/30/2014   RMR: Colonoscopy anal canal/internal hemorrhoids, otherwise normal ileocolonoscoopy.    ESOPHAGOGASTRODUODENOSCOPY Left 09/01/2012   CXK:GYJEHU esophageal erosions consistent with erosive reflux esophagitis/Pre-pyloric benign-appearing gastric ulcer with bleeding stigmata status post bleeding control therapy as described above   ESOPHAGOGASTRODUODENOSCOPY N/A 12/13/2012   DJS:HFWYOV hernia. PReviously noted gastric ulcer completely healed.    ESOPHAGOGASTRODUODENOSCOPY (EGD) WITH PROPOFOL N/A 07/30/2014   Procedure: ESOPHAGOGASTRODUODENOSCOPY (EGD) WITH PROPOFOL;  Surgeon: Corbin Ade, MD;  Location: [facility];  Service: Endoscopy;   Laterality: N/A;   TONSILLECTOMY     Patient Active Problem List   Diagnosis Date Noted   Gout 12/29/2017   Tobacco abuse 12/29/2017   Acute hypoxemic respiratory failure (HCC) 12/29/2017   Bronchitis 12/29/2017   Rectal bleed 07/30/2014   Hiatal hernia    Hemorrhoid    Acute lower GI bleeding 07/29/2014   Abdominal pain    Wrist pain, acute    Tenosynovitis of finger, hand, or wrist    Cellulitis of hand 03/10/2014   Right upper quadrant abdominal pain 02/10/2014   Cholelithiasis 02/10/2014   Polyarthritis 11/08/2012   UTI (lower urinary tract infection) 10/21/2012   Cellulitis 09/17/2012   Tinea pedis 09/17/2012   Erosive esophagitis 09/07/2012   Hypokalemia 09/05/2012   Gastric ulcer with hemorrhage 09/02/2012   Upper GI bleed 09/02/2012   Acute blood loss anemia 09/02/2012   Hematemesis 09/01/2012   Normocytic anemia 09/01/2012   Acute gouty arthritis 09/01/2012   Morbid obesity (HCC) 09/01/2012    REFERRING DIAG: PT eval/tx for LBP   THERAPY DIAG:  Difficulty in walking, not elsewhere classified  Bilateral leg weakness  Impaired mobility and ADLs  Impaired functional mobility and endurance  PERTINENT HISTORY: Gout and Gastrointestinal hemorrhage  PRECAUTIONS: none  SUBJECTIVE: Pt stated his legs are killing him today.  No reports of recent falls.  PAIN:  Are you having pain? Yes: NPRS scale: 9/10 Pain location: feet/legs Pain description: dull ache Aggravating factors: movement  Relieving factors: rest   OBJECTIVE: (objective measures completed at initial evaluation unless otherwise dated)   DIAGNOSTIC FINDINGS:  NA     COGNITION:           Overall cognitive status: Within functional limits for tasks assessed                     LUMBAR ROM:    Active  A/PROM  08/10/2021  Flexion 50% limited due to balance   Extension 100% limited (loses balance)   Right lateral flexion    Left lateral flexion    Right rotation    Left rotation      (Blank rows = not tested)       LE MMT: Ankle DF limited by decreased AROM, hip extension tested in sidelying    MMT Right 08/10/2021 Left 08/10/2021  Hip flexion 5 5  Hip extension 4 4  Hip abduction 4 4+  Hip adduction      Hip internal rotation      Hip external rotation      Knee flexion      Knee extension 4+ 4+  Ankle dorsiflexion 3- 3-  Ankle plantarflexion      Ankle inversion      Ankle eversion       (Blank rows = not tested)     GAIT: Distance walked: 8 feet Assistive device utilized:  holding onto mat Level of assistance: SBA Comments: Forward flexed trunk, fatigues quickly        TODAY'S TREATMENT  09/09/23 Gait to Nustep 79ft no AD Nustep [place] level 3, resistance 5 x 5 min BTB shoulder extension BTB rows 15 BTB paloff 15x 2 Standing marching 2x 10 Standing hip abd with BTB around thigh 2x 10 Standing hip extension 2x 10 Seated quadricpes extension 7Pl 3x 10 with body craft machine  09/06/21 Gait in hallway 180 feet total (2 bouts of 80 feet) no AD Nustep Lv 5 (seat 13)  for strength and ROM  5 min  Standing march 2 x 10 Standing hip extension 2 x 10 Sidestepping on blue line (32 feet RT) 2 RT Seated hamstring curl 7 PL 3 x 20 each with bodycraft pulleys Seated quadriceps extension 7PL 2X20 each with bodycraft pulleys  08/30/21 Nustep Lv 5 (seat 13)  for strength and ROM  5 min  Standing march 2 x 10 Standing hip extension 2 x 10 Sidestepping in hallway 20 feet 2 RT Gait in hallway 130 feet int support from wall Stairs 7 inch 2 RT, step to pattern, both hand rails  Seated hamstring curl 5 plates 3 x 20 each   08/25/21 78ft  Gait training no AD 104 prior rest break WC behind Sidestep down long hallway 1RT each direction, WC behind Alternating marching 10x3" Tandem stance 2x 30" 10 STS  08/16/21 Bridge 2x10  SLR 2x 10 bilateral STS 2x 10  Step up 4 inch step 2x 10 RLE, 1x 10 LLE Hamstring curls 6 plates 2x 20 bilateral 3 second  holds  08/10/21 Seated calf stretch 3 x 10" using strap      PATIENT EDUCATION:  Education details:  HEP  Person educated: Patient Education method: Explanation Education comprehension: verbalized understanding     HOME EXERCISE PROGRAM: Access Code: [email or web address] Date: 08/10/2021 - Seated Calf Stretch with Strap  - 3 x daily - 7 x weekly - 1 sets - 5 reps - 20 second hold - Sit to Stand with Counter Support  -  3 x daily - 7 x weekly - 3 sets - 5 reps - Sidelying Hip Abduction  - 3 x daily - 7 x weekly - 2 sets - 10 reps 5/2 - Supine Bridge  - 1 x daily - 7 x weekly - 3 sets - 10 reps - Supine Straight Leg Raises  - 1 x daily - 7 x weekly - 3 sets - 10 reps   ASSESSMENT:   CLINICAL IMPRESSION: Pt limited with increased hip and foot pain, decreased tolerance with weight bearing and gait today.  Recommended pt to ambulate with AD for pain and balance control, pt with decreased grip strength so recommend loft strands or crutches.  Pt. required increased seated rest breaks this session due to pain.  Added standing postural strengthening exercises and paloff with cueing for core engagement and posture awareness.  Pt presents with WBOS during standing exercises for balance.      OBJECTIVE IMPAIRMENTS Abnormal gait, decreased activity tolerance, decreased balance, decreased endurance, decreased knowledge of use of DME, decreased mobility, difficulty walking, decreased ROM, decreased strength, improper body mechanics, obesity, and pain.    ACTIVITY LIMITATIONS cleaning, community activity, driving, meal prep, laundry, yard work, shopping, and yard work.    PERSONAL FACTORS Past/current experiences and    are also affecting patient's functional outcome.      REHAB POTENTIAL: Fair Past experience, obesity, chronicity    CLINICAL DECISION MAKING: Stable/uncomplicated   EVALUATION COMPLEXITY: Low     GOALS:   SHORT TERM GOALS: Target date: 08/31/2021   Patient will be independent with  initial HEP and self-management strategies to improve functional outcomes Baseline:  Goal status: ongoing       LONG TERM GOALS: Target date: 09/21/2021   Patient will be independent with advanced HEP and self-management strategies to improve functional outcomes Baseline:  Goal status: ongoing   2.  Patient will be able to stand > 5 minutes with pain not to exceed 3/10 in lumbar to improve ability to perform cooking and grooming ADLs. Baseline: 1 minute Goal status: ongoing   3.  Patient will be able to ambulate at least 150 feet with LRAD to demonstrate improved ability to perform functional mobility and associated tasks. Baseline: about 10 feet with HHA x 1 Goal status: ongoing   4. Patient will have equal to or > 4+/5 MMT throughout BLE to improve ability to perform functional mobility, stair ambulation and ADLs.  Baseline: See MMTs Goal status: ongoing       PLAN: PT FREQUENCY: 2x/week   PT DURATION: 6 weeks   PLANNED INTERVENTIONS: Therapeutic exercises, Therapeutic activity, Neuromuscular re-education, Balance training, Gait training, Patient/Family education, Joint manipulation, Joint mobilization, Stair training, Aquatic Therapy, Dry Needling, Electrical stimulation, Spinal manipulation, Spinal mobilization, Cryotherapy, Moist heat, scar mobilization, Taping, Traction, Ultrasound, Biofeedback, Ionotophoresis 4mg /ml Dexamethasone, and Manual therapy. Marland Kitchen.   PLAN FOR NEXT SESSION: LE strength and ankle mobility. Progress standing activities, posture, core strength, gait and balance.     Becky Saxasey Sameer Teeple, LPTA/CLT; CBIS [phone]  10:20 AM, 09/08/21

## 2021-09-13 ENCOUNTER — Ambulatory Visit (HOSPITAL_COMMUNITY): Payer: Medicaid Other | Attending: Internal Medicine | Admitting: Physical Therapy

## 2021-09-13 ENCOUNTER — Encounter (HOSPITAL_COMMUNITY): Payer: Self-pay | Admitting: Physical Therapy

## 2021-09-13 DIAGNOSIS — R29898 Other symptoms and signs involving the musculoskeletal system: Secondary | ICD-10-CM

## 2021-09-13 DIAGNOSIS — M545 Low back pain, unspecified: Secondary | ICD-10-CM | POA: Diagnosis present

## 2021-09-13 DIAGNOSIS — R262 Difficulty in walking, not elsewhere classified: Secondary | ICD-10-CM

## 2021-09-13 NOTE — Therapy (Signed)
OUTPATIENT PHYSICAL THERAPY TREATMENT NOTE   Patient Name: [name] MRN: [id number] DOB:[birth year], 36 y.o., male Today's Date: 09/13/2021  PCP: Toma Deiters, MD REFERRING PROVIDER: Toma Deiters, MD  END OF SESSION:   PT End of Session - 09/13/21 0955     Visit Number 7    Number of Visits 12    Date for PT Re-Evaluation 09/21/21    Authorization Type [place] [employer]    Authorization Time Period 12 visits approved prior need for further request    Authorization - Visit Number 7    Authorization - Number of Visits 12    Progress Note Due on Visit 10    PT Start Time 0950    PT Stop Time 1028    PT Time Calculation (min) 38 min    Activity Tolerance Patient limited by pain;Patient tolerated treatment well;Patient limited by fatigue    Behavior During Therapy [facility] for tasks assessed/performed               Past Medical History:  Diagnosis Date   Acute blood loss anemia 09/02/2012   S/p 1 unit rbcs   Cellulitis    Erosive esophagitis 09/01/2012   NSAID induced.   Gall stones    Gastric ulcer with hemorrhage 09/02/2012   s/p bleeding control tx. Per Dr. Jena Gauss   Gout    Obesity    UTI (lower urinary tract infection)    Past Surgical History:  Procedure Laterality Date   COLONOSCOPY WITH PROPOFOL N/A 07/30/2014   RMR: Colonoscopy anal canal/internal hemorrhoids, otherwise normal ileocolonoscoopy.    ESOPHAGOGASTRODUODENOSCOPY Left 09/01/2012   JAS:NKNLZJ esophageal erosions consistent with erosive reflux esophagitis/Pre-pyloric benign-appearing gastric ulcer with bleeding stigmata status post bleeding control therapy as described above   ESOPHAGOGASTRODUODENOSCOPY N/A 12/13/2012   QBH:ALPFXT hernia. PReviously noted gastric ulcer completely healed.    ESOPHAGOGASTRODUODENOSCOPY (EGD) WITH PROPOFOL N/A 07/30/2014   Procedure: ESOPHAGOGASTRODUODENOSCOPY (EGD) WITH PROPOFOL;  Surgeon: Corbin Ade, MD;  Location: [facility];  Service: Endoscopy;   Laterality: N/A;   TONSILLECTOMY     Patient Active Problem List   Diagnosis Date Noted   Gout 12/29/2017   Tobacco abuse 12/29/2017   Acute hypoxemic respiratory failure (HCC) 12/29/2017   Bronchitis 12/29/2017   Rectal bleed 07/30/2014   Hiatal hernia    Hemorrhoid    Acute lower GI bleeding 07/29/2014   Abdominal pain    Wrist pain, acute    Tenosynovitis of finger, hand, or wrist    Cellulitis of hand 03/10/2014   Right upper quadrant abdominal pain 02/10/2014   Cholelithiasis 02/10/2014   Polyarthritis 11/08/2012   UTI (lower urinary tract infection) 10/21/2012   Cellulitis 09/17/2012   Tinea pedis 09/17/2012   Erosive esophagitis 09/07/2012   Hypokalemia 09/05/2012   Gastric ulcer with hemorrhage 09/02/2012   Upper GI bleed 09/02/2012   Acute blood loss anemia 09/02/2012   Hematemesis 09/01/2012   Normocytic anemia 09/01/2012   Acute gouty arthritis 09/01/2012   Morbid obesity (HCC) 09/01/2012    REFERRING DIAG: PT eval/tx for LBP   THERAPY DIAG:  Difficulty in walking, not elsewhere classified  Bilateral leg weakness  PERTINENT HISTORY: Gout and Gastrointestinal hemorrhage  PRECAUTIONS: none  SUBJECTIVE: He is sore and aching today. His RT ankle is swollen. He reports no MOI.   PAIN:  Are you having pain? Yes: NPRS scale: 7/10 Pain location: feet/legs Pain description: dull ache Aggravating factors: movement Relieving factors: rest   OBJECTIVE: (objective measures completed  at initial evaluation unless otherwise dated)   DIAGNOSTIC FINDINGS:  NA     COGNITION:           Overall cognitive status: Within functional limits for tasks assessed                     LUMBAR ROM:    Active  A/PROM  08/10/2021  Flexion 50% limited due to balance   Extension 100% limited (loses balance)   Right lateral flexion    Left lateral flexion    Right rotation    Left rotation     (Blank rows = not tested)       LE MMT: Ankle DF limited by decreased  AROM, hip extension tested in sidelying    MMT Right 08/10/2021 Left 08/10/2021  Hip flexion 5 5  Hip extension 4 4  Hip abduction 4 4+  Hip adduction      Hip internal rotation      Hip external rotation      Knee flexion      Knee extension 4+ 4+  Ankle dorsiflexion 3- 3-  Ankle plantarflexion      Ankle inversion      Ankle eversion       (Blank rows = not tested)     GAIT: Distance walked: 8 feet Assistive device utilized:  holding onto mat Level of assistance: SBA Comments: Forward flexed trunk, fatigues quickly        TODAY'S TREATMENT  09/13/21 Nustep lv5 5 min seat 12  Standing march x20 Standing hip abduction/ extension x20 each   Seated HS curl using bodycraft 7 plates 3 x 10 Seated leg extension with bodycraft  7 plates 3 x 10  0/10/27 Gait to Nustep 54ft no AD Nustep [place] level 3, resistance 5 x 5 min BTB shoulder extension BTB rows 15 BTB paloff 15x 2 Standing marching 2x 10 Standing hip abd with BTB around thigh 2x 10 Standing hip extension 2x 10 Seated quadricpes extension 7Pl 3x 10 with body craft machine    PATIENT EDUCATION:  Education details:  HEP  Person educated: Patient Education method: Explanation Education comprehension: verbalized understanding     HOME EXERCISE PROGRAM: Access Code: [email or web address] Date: 08/10/2021 - Seated Calf Stretch with Strap  - 3 x daily - 7 x weekly - 1 sets - 5 reps - 20 second hold - Sit to Stand with Counter Support  - 3 x daily - 7 x weekly - 3 sets - 5 reps - Sidelying Hip Abduction  - 3 x daily - 7 x weekly - 2 sets - 10 reps 5/2 - Supine Bridge  - 1 x daily - 7 x weekly - 3 sets - 10 reps - Supine Straight Leg Raises  - 1 x daily - 7 x weekly - 3 sets - 10 reps   ASSESSMENT:   CLINICAL IMPRESSION:  Continued with established POC for LE strengthening and increased activity tolerance/ conditioning. Patient able to ambulate 100 feet in hallway but was slightly more limited by RT ankle pain today. Patient  shows improved ability with increased resistance using bodycraft machine for seated LE strengthening. Patient will continue to benefit from skilled therapy services to reduce remaining deficits and improve functional ability.      OBJECTIVE IMPAIRMENTS Abnormal gait, decreased activity tolerance, decreased balance, decreased endurance, decreased knowledge of use of DME, decreased mobility, difficulty walking, decreased ROM, decreased strength, improper body mechanics, obesity, and pain.    ACTIVITY  LIMITATIONS cleaning, community activity, driving, meal prep, laundry, yard work, shopping, and yard work.    PERSONAL FACTORS Past/current experiences and    are also affecting patient's functional outcome.      REHAB POTENTIAL: Fair Past experience, obesity, chronicity    CLINICAL DECISION MAKING: Stable/uncomplicated   EVALUATION COMPLEXITY: Low     GOALS:   SHORT TERM GOALS: Target date: 08/31/2021   Patient will be independent with initial HEP and self-management strategies to improve functional outcomes Baseline:  Goal status: ongoing       LONG TERM GOALS: Target date: 09/21/2021   Patient will be independent with advanced HEP and self-management strategies to improve functional outcomes Baseline:  Goal status: ongoing   2.  Patient will be able to stand > 5 minutes with pain not to exceed 3/10 in lumbar to improve ability to perform cooking and grooming ADLs. Baseline: 1 minute Goal status: ongoing   3.  Patient will be able to ambulate at least 150 feet with LRAD to demonstrate improved ability to perform functional mobility and associated tasks. Baseline: about 10 feet with HHA x 1 Goal status: ongoing   4. Patient will have equal to or > 4+/5 MMT throughout BLE to improve ability to perform functional mobility, stair ambulation and ADLs.  Baseline: See MMTs Goal status: ongoing       PLAN: PT FREQUENCY: 2x/week   PT DURATION: 6 weeks   PLANNED INTERVENTIONS:  Therapeutic exercises, Therapeutic activity, Neuromuscular re-education, Balance training, Gait training, Patient/Family education, Joint manipulation, Joint mobilization, Stair training, Aquatic Therapy, Dry Needling, Electrical stimulation, Spinal manipulation, Spinal mobilization, Cryotherapy, Moist heat, scar mobilization, Taping, Traction, Ultrasound, Biofeedback, Ionotophoresis 4mg /ml Dexamethasone, and Manual therapy. Marland Kitchen.   PLAN FOR NEXT SESSION: LE strength and ankle mobility. Progress standing activities, posture, core strength, gait and balance.     9:55 AM, 09/13/21 Georges Lynchameron Aneth Schlagel PT DPT  Physical Therapist with [facility]  [facility]  [phone]

## 2021-09-15 ENCOUNTER — Ambulatory Visit (HOSPITAL_COMMUNITY): Payer: Medicaid Other | Attending: Internal Medicine | Admitting: Physical Therapy

## 2021-09-15 DIAGNOSIS — M545 Low back pain, unspecified: Secondary | ICD-10-CM | POA: Diagnosis present

## 2021-09-15 DIAGNOSIS — R531 Weakness: Secondary | ICD-10-CM | POA: Diagnosis present

## 2021-09-15 DIAGNOSIS — Z789 Other specified health status: Secondary | ICD-10-CM

## 2021-09-15 DIAGNOSIS — R29898 Other symptoms and signs involving the musculoskeletal system: Secondary | ICD-10-CM

## 2021-09-15 DIAGNOSIS — Z7409 Other reduced mobility: Secondary | ICD-10-CM

## 2021-09-15 DIAGNOSIS — R262 Difficulty in walking, not elsewhere classified: Secondary | ICD-10-CM

## 2021-09-15 NOTE — Therapy (Signed)
OUTPATIENT PHYSICAL THERAPY TREATMENT NOTE   Patient Name: [name] MRN: [id number] DOB:03-08-1986, 36 y.o., male Today's Date: 09/15/2021  PCP: Toma Deiters, MD REFERRING PROVIDER: Toma Deiters, MD  END OF SESSION:   PT End of Session - 09/15/21 1042     Visit Number 8    Number of Visits 12    Date for PT Re-Evaluation 09/21/21    Authorization Type [place] [employer]    Authorization Time Period 12 visits approved prior need for further request    Authorization - Visit Number 8    Authorization - Number of Visits 12    Progress Note Due on Visit 10    PT Start Time 1045    PT Stop Time 1125    PT Time Calculation (min) 40 min    Activity Tolerance Patient limited by pain;Patient tolerated treatment well;Patient limited by fatigue    Behavior During Therapy [facility] for tasks assessed/performed               Past Medical History:  Diagnosis Date   Acute blood loss anemia 09/02/2012   S/p 1 unit rbcs   Cellulitis    Erosive esophagitis 09/01/2012   NSAID induced.   Gall stones    Gastric ulcer with hemorrhage 09/02/2012   s/p bleeding control tx. Per Dr. Jena Gauss   Gout    Obesity    UTI (lower urinary tract infection)    Past Surgical History:  Procedure Laterality Date   COLONOSCOPY WITH PROPOFOL N/A 07/30/2014   RMR: Colonoscopy anal canal/internal hemorrhoids, otherwise normal ileocolonoscoopy.    ESOPHAGOGASTRODUODENOSCOPY Left 09/01/2012   POE:UMPNTI esophageal erosions consistent with erosive reflux esophagitis/Pre-pyloric benign-appearing gastric ulcer with bleeding stigmata status post bleeding control therapy as described above   ESOPHAGOGASTRODUODENOSCOPY N/A 12/13/2012   RWE:[id number] hernia. PReviously noted gastric ulcer completely healed.    ESOPHAGOGASTRODUODENOSCOPY (EGD) WITH PROPOFOL N/A 07/30/2014   Procedure: ESOPHAGOGASTRODUODENOSCOPY (EGD) WITH PROPOFOL;  Surgeon: Corbin Ade, MD;  Location: [facility];  Service: Endoscopy;   Laterality: N/A;   TONSILLECTOMY     Patient Active Problem List   Diagnosis Date Noted   Gout 12/29/2017   Tobacco abuse 12/29/2017   Acute hypoxemic respiratory failure (HCC) 12/29/2017   Bronchitis 12/29/2017   Rectal bleed 07/30/2014   Hiatal hernia    Hemorrhoid    Acute lower GI bleeding 07/29/2014   Abdominal pain    Wrist pain, acute    Tenosynovitis of finger, hand, or wrist    Cellulitis of hand 03/10/2014   Right upper quadrant abdominal pain 02/10/2014   Cholelithiasis 02/10/2014   Polyarthritis 11/08/2012   UTI (lower urinary tract infection) 10/21/2012   Cellulitis 09/17/2012   Tinea pedis 09/17/2012   Erosive esophagitis 09/07/2012   Hypokalemia 09/05/2012   Gastric ulcer with hemorrhage 09/02/2012   Upper GI bleed 09/02/2012   Acute blood loss anemia 09/02/2012   Hematemesis 09/01/2012   Normocytic anemia 09/01/2012   Acute gouty arthritis 09/01/2012   Morbid obesity (HCC) 09/01/2012    REFERRING DIAG: PT eval/tx for LBP   THERAPY DIAG:  Difficulty in walking, not elsewhere classified  Bilateral leg weakness  Impaired mobility and ADLs  Impaired functional mobility and endurance  PERTINENT HISTORY: Gout and Gastrointestinal hemorrhage  PRECAUTIONS: none  SUBJECTIVE: Pt states that his Rt ankle still hurts, he is doing his exercises twice a day.     PAIN:  Are you having pain? Yes: NPRS scale: 9/10 Pain location: feet/legs Pain description:  dull ache Aggravating factors: movement Relieving factors: rest   OBJECTIVE: (objective measures completed at initial evaluation unless otherwise dated)   DIAGNOSTIC FINDINGS:  NA     COGNITION:           Overall cognitive status: Within functional limits for tasks assessed                     LUMBAR ROM:    Active  A/PROM  08/10/2021  Flexion 50% limited due to balance   Extension 100% limited (loses balance)   Right lateral flexion    Left lateral flexion    Right rotation    Left  rotation     (Blank rows = not tested)       LE MMT: Ankle DF limited by decreased AROM, hip extension tested in sidelying    MMT Right 08/10/2021 Left 08/10/2021  Hip flexion 5 5  Hip extension 4 4  Hip abduction 4 4+  Hip adduction      Hip internal rotation      Hip external rotation      Knee flexion      Knee extension 4+ 4+  Ankle dorsiflexion 3- 3-  Ankle plantarflexion      Ankle inversion      Ankle eversion       (Blank rows = not tested)     GAIT: Distance walked: 8 feet Assistive device utilized:  holding onto mat Level of assistance: SBA Comments: Forward flexed trunk, fatigues quickly        TODAY'S TREATMENT               09/15/2021              Standing:              Heel raises x 10              Toe raises x 1              Marching  x 10              Functional squat x 10              Sitting:                          Baps level 3 Dorsi/plantar, in/eversion and around [place] x 10 each                          Heel slides Rt x 5                          Towel crunch x 3                           B ankle DF/PF x 10                           B ankle inversion/eversion x 10               Sit to stand x5  09/13/21 Nustep lv5 5 min seat 12  Standing march x20 Standing hip abduction/ extension x20 each   Seated HS curl using bodycraft 7 plates 3 x 10 Seated leg extension with bodycraft  7 plates 3 x 10  6/73/41  Gait to Nustep 105ft no AD Nustep [place] level 3, resistance 5 x 5 min BTB shoulder extension BTB rows 15 BTB paloff 15x 2 Standing marching 2x 10 Standing hip abd with BTB around thigh 2x 10 Standing hip extension 2x 10 Seated quadricpes extension 7Pl 3x 10 with body craft machine    PATIENT EDUCATION:  Education details:  HEP  Person educated: Patient Education method: Explanation Education comprehension: verbalized understanding     HOME EXERCISE PROGRAM:               09/15/21:   [id number] toe crunch, ankle  dorsiflexion/plantarflexion, ankle in/eversion  Access Code: [email or web address] Date: 08/10/2021 - Seated Calf Stretch with Strap  - 3 x daily - 7 x weekly - 1 sets - 5 reps - 20 second hold - Sit to Stand with Counter Support  - 3 x daily - 7 x weekly - 3 sets - 5 reps - Sidelying Hip Abduction  - 3 x daily - 7 x weekly - 2 sets - 10 reps 5/2 - Supine Bridge  - 1 x daily - 7 x weekly - 3 sets - 10 reps - Supine Straight Leg Raises  - 1 x daily - 7 x weekly - 3 sets - 10 reps   ASSESSMENT:   CLINICAL IMPRESSION:  Pt main complaint was Rt ankle pain therefore treatment focused on ankle exercises to improve motion and stability.  Updated HEP.  Pt has significantly weakened core strength and will continue to benefit from skilled PT to improve his functioning level as he is still spending all day in his wheelchair.    OBJECTIVE IMPAIRMENTS Abnormal gait, decreased activity tolerance, decreased balance, decreased endurance, decreased knowledge of use of DME, decreased mobility, difficulty walking, decreased ROM, decreased strength, improper body mechanics, obesity, and pain.    ACTIVITY LIMITATIONS cleaning, community activity, driving, meal prep, laundry, yard work, shopping, and yard work.    PERSONAL FACTORS Past/current experiences and    are also affecting patient's functional outcome.      REHAB POTENTIAL: Fair Past experience, obesity, chronicity    CLINICAL DECISION MAKING: Stable/uncomplicated   EVALUATION COMPLEXITY: Low     GOALS:   SHORT TERM GOALS: Target date: 08/31/2021   Patient will be independent with initial HEP and self-management strategies to improve functional outcomes Baseline:  Goal status: ongoing       LONG TERM GOALS: Target date: 09/21/2021   Patient will be independent with advanced HEP and self-management strategies to improve functional outcomes Baseline:  Goal status: ongoing   2.  Patient will be able to stand > 5 minutes with pain not to exceed 3/10 in  lumbar to improve ability to perform cooking and grooming ADLs. Baseline: 1 minute Goal status: ongoing   3.  Patient will be able to ambulate at least 150 feet with LRAD to demonstrate improved ability to perform functional mobility and associated tasks. Baseline: about 10 feet with HHA x 1 Goal status: ongoing   4. Patient will have equal to or > 4+/5 MMT throughout BLE to improve ability to perform functional mobility, stair ambulation and ADLs.  Baseline: See MMTs Goal status: ongoing       PLAN: PT FREQUENCY: 2x/week   PT DURATION: 6 weeks   PLANNED INTERVENTIONS: Therapeutic exercises, Therapeutic activity, Neuromuscular re-education, Balance training, Gait training, Patient/Family education, Joint manipulation, Joint mobilization, Stair training, Aquatic Therapy, Dry Needling, Electrical stimulation, Spinal manipulation, Spinal mobilization, Cryotherapy, Moist heat, scar mobilization, Taping, Traction, Ultrasound, Biofeedback,  Ionotophoresis 4mg /ml Dexamethasone, and Manual therapy. Marland Kitchen.   PLAN FOR NEXT SESSION:  re-assess, begin walking.  LE strength and ankle mobility. Progress standing activities, posture, core strength, gait and balance.     10:44 AM, 09/15/21 Georges Lynchameron Caffaro PT DPT  Physical Therapist with [facility]  [facility]  [phone]

## 2021-09-22 ENCOUNTER — Encounter (HOSPITAL_COMMUNITY): Payer: Self-pay

## 2021-09-22 ENCOUNTER — Ambulatory Visit (HOSPITAL_COMMUNITY): Payer: Medicaid Other | Attending: Internal Medicine

## 2021-09-22 DIAGNOSIS — Z7409 Other reduced mobility: Secondary | ICD-10-CM | POA: Insufficient documentation

## 2021-09-22 DIAGNOSIS — R262 Difficulty in walking, not elsewhere classified: Secondary | ICD-10-CM | POA: Diagnosis present

## 2021-09-22 DIAGNOSIS — Z789 Other specified health status: Secondary | ICD-10-CM | POA: Diagnosis present

## 2021-09-22 DIAGNOSIS — R29898 Other symptoms and signs involving the musculoskeletal system: Secondary | ICD-10-CM | POA: Insufficient documentation

## 2021-09-22 NOTE — Therapy (Signed)
OUTPATIENT PHYSICAL THERAPY TREATMENT NOTE   Patient Name: [name] MRN: [id number] DOB:06-07-1985, 36 y.o., male Today's Date: 09/22/2021  PCP: Neale Burly, MD REFERRING PROVIDER: Neale Burly, MD  Progress Note   Reporting Period 08/10/21  to 09/22/21   See note below for Objective Data and Assessment of Progress/Goals   END OF SESSION:   PT End of Session - 09/22/21 0955     Visit Number 9    Number of Visits 21    Date for PT Re-Evaluation 11/03/21    Authorization Type [place] [employer]    Authorization Time Period requested for visits on 09/22/21, please check for auth    Authorization - Visit Number 9    Authorization - Number of Visits 12    Progress Note Due on Visit 21    PT Start Time 0955    PT Stop Time 1030    PT Time Calculation (min) 35 min    Activity Tolerance Patient limited by pain;Patient tolerated treatment well;Patient limited by fatigue    Behavior During Therapy [facility] for tasks assessed/performed               Past Medical History:  Diagnosis Date   Acute blood loss anemia 09/02/2012   S/p 1 unit rbcs   Cellulitis    Erosive esophagitis 09/01/2012   NSAID induced.   Gall stones    Gastric ulcer with hemorrhage 09/02/2012   s/p bleeding control tx. Per Dr. Gala Romney   Gout    Obesity    UTI (lower urinary tract infection)    Past Surgical History:  Procedure Laterality Date   COLONOSCOPY WITH PROPOFOL N/A 07/30/2014   RMR: Colonoscopy anal canal/internal hemorrhoids, otherwise normal ileocolonoscoopy.    ESOPHAGOGASTRODUODENOSCOPY Left 09/01/2012   WFU:[id number] esophageal erosions consistent with erosive reflux esophagitis/Pre-pyloric benign-appearing gastric ulcer with bleeding stigmata status post bleeding control therapy as described above   ESOPHAGOGASTRODUODENOSCOPY N/A 12/13/2012   DDU:KGURKY hernia. PReviously noted gastric ulcer completely healed.    ESOPHAGOGASTRODUODENOSCOPY (EGD) WITH PROPOFOL N/A 07/30/2014    Procedure: ESOPHAGOGASTRODUODENOSCOPY (EGD) WITH PROPOFOL;  Surgeon: Daneil Dolin, MD;  Location: [facility];  Service: Endoscopy;  Laterality: N/A;   TONSILLECTOMY     Patient Active Problem List   Diagnosis Date Noted   Gout 12/29/2017   Tobacco abuse 12/29/2017   Acute hypoxemic respiratory failure ([place]) 12/29/2017   Bronchitis 12/29/2017   Rectal bleed 07/30/2014   Hiatal hernia    Hemorrhoid    Acute lower GI bleeding 07/29/2014   Abdominal pain    Wrist pain, acute    Tenosynovitis of finger, hand, or wrist    Cellulitis of hand 03/10/2014   Right upper quadrant abdominal pain 02/10/2014   Cholelithiasis 02/10/2014   Polyarthritis 11/08/2012   UTI (lower urinary tract infection) 10/21/2012   Cellulitis 09/17/2012   Tinea pedis 09/17/2012   Erosive esophagitis 09/07/2012   Hypokalemia 09/05/2012   Gastric ulcer with hemorrhage 09/02/2012   Upper GI bleed 09/02/2012   Acute blood loss anemia 09/02/2012   Hematemesis 09/01/2012   Normocytic anemia 09/01/2012   Acute gouty arthritis 09/01/2012   Morbid obesity ([place]) 09/01/2012    REFERRING DIAG: PT eval/tx for LBP   THERAPY DIAG:  Difficulty in walking, not elsewhere classified - Plan: PT plan of care cert/re-cert  Bilateral leg weakness - Plan: PT plan of care cert/re-cert  Impaired mobility and ADLs - Plan: PT plan of care cert/re-cert  Impaired functional mobility and endurance -  Plan: PT plan of care cert/re-cert  PERTINENT HISTORY: Gout and Gastrointestinal hemorrhage  PRECAUTIONS: none  SUBJECTIVE: patient feels he is making improvements in walking short distances from stairs to car. Patient feels like he can stand and do more now. Ankles, back are still in pain. Pt reports he was up doing dishes this morning. Getting wheel chair in and out of car.    PAIN:  Are you having pain? Yes: NPRS scale: seated 8/10 Pain location: feet/legs Pain description: dull ache Aggravating factors: movement Relieving  factors: rest   OBJECTIVE: (objective measures completed at initial evaluation unless otherwise dated)   DIAGNOSTIC FINDINGS:  NA     COGNITION:           Overall cognitive status: Within functional limits for tasks assessed                     LUMBAR ROM:    Active  A/PROM  08/10/2021 AROM 09/22/21  Flexion 50% limited due to balance  50% limited    Extension 100% limited (loses balance)  80% limited (stays in forward flexed posture)  Right lateral flexion     Left lateral flexion     Right rotation     Left rotation      (Blank rows = not tested)       LE MMT: Ankle DF limited by decreased AROM, hip extension tested in sidelying    MMT Right 08/10/2021 Left 08/10/2021 Right 09/22/21 Left  09/22/21  Hip flexion 5 5 5 5   Hip extension 4 4 4 4   Hip abduction 4 4+ 4 4+  Hip adduction        Hip internal rotation        Hip external rotation        Knee flexion        Knee extension 4+ 4+ 4+ 5  Ankle dorsiflexion 3- 3- No active and passive range in right ankle,hard end feel  4  Ankle plantarflexion        Ankle inversion        Ankle eversion         (Blank rows = not tested)     GAIT: Distance walked: 42 feet Assistive device utilized:  no ad, near wall, with wheelchair follow Level of assistance: SBA Comments: Forward flexed trunk, fatigues quickly, waddles, decreased step length  Sit to stand at SBA       TODAY'S TREATMENT    09/22/21   Reassessment of transfers, mmt, walking                 09/15/2021              Standing:              Heel raises x 10              Toe raises x 1              Marching  x 10              Functional squat x 10              Sitting:                          Baps level 3 Dorsi/plantar, in/eversion and around the [place] x 10 each  Heel slides Rt x 5                          Towel crunch x 3                           B ankle DF/PF x 10                           B ankle inversion/eversion x 10                Sit to stand x5  09/13/21 Nustep lv5 5 min seat 12  Standing march x20 Standing hip abduction/ extension x20 each   Seated HS curl using bodycraft 7 plates 3 x 10 Seated leg extension with bodycraft  7 plates 3 x 10  06/07/23 Gait to Nustep 74f no AD Nustep [place] level 3, resistance 5 x 5 min BTB shoulder extension BTB rows 15 BTB paloff 15x 2 Standing marching 2x 10 Standing hip abd with BTB around thigh 2x 10 Standing hip extension 2x 10 Seated quadricpes extension 7Pl 3x 10 with body craft machine    PATIENT EDUCATION:  Education details:  HEP  Person educated: Patient Education method: Explanation Education comprehension: verbalized understanding     HOME EXERCISE PROGRAM:               09/15/21:   [id number] crunch, ankle dorsiflexion/plantarflexion, ankle in/eversion  Access Code: [email or web address]: 08/10/2021 - Seated Calf Stretch with Strap  - 3 x daily - 7 x weekly - 1 sets - 5 reps - 20 second hold - Sit to Stand with Counter Support  - 3 x daily - 7 x weekly - 3 sets - 5 reps - Sidelying Hip Abduction  - 3 x daily - 7 x weekly - 2 sets - 10 reps 5/2 - Supine Bridge  - 1 x daily - 7 x weekly - 3 sets - 10 reps - Supine Straight Leg Raises  - 1 x daily - 7 x weekly - 3 sets - 10 reps   ASSESSMENT:   CLINICAL IMPRESSION: To date session focus has been on general strengthening with some ankle strength. Patient with long history of right ankle gout and arthritis. On assessment today patients ankle persists to be in pain with limited to no passive range, pts ankle with hard end feel. Patient is morbidly obese and is reliant on wheelchair as primary means of mobility. Patient also reports that he gets pain in legs and back upon standing. At this time patient is able to tolerate walking aprox 42 feet and can stand for approximately 1-2 minutes at at time. Patient does not obtain full upright erect position on standing and has forward flexed trunk. Patient will continue  to benefit from skilled PT services to improve strength and functional mobility.   OBJECTIVE IMPAIRMENTS Abnormal gait, decreased activity tolerance, decreased balance, decreased endurance, decreased knowledge of use of DME, decreased mobility, difficulty walking, decreased ROM, decreased strength, improper body mechanics, obesity, and pain.    ACTIVITY LIMITATIONS cleaning, community activity, driving, meal prep, laundry, yard work, shopping, and yard work.    PERSONAL FACTORS Past/current experiences and    are also affecting patient's functional outcome.      REHAB POTENTIAL: Fair Past experience, obesity, chronicity    CLINICAL DECISION MAKING: Stable/uncomplicated   EVALUATION COMPLEXITY: Low  GOALS:   SHORT TERM GOALS: Target date: 08/31/2021   Patient will be independent with initial HEP and self-management strategies to improve functional outcomes Baseline: mostly independent, needs help with set up of bands sometimes  Goal status: Met       LONG TERM GOALS: Target date: 11/03/21   Patient will be independent with advanced HEP and self-management strategies to improve functional outcomes Baseline: mostly independent, needs help with set up of bands sometimes  Goal status: ongoing   2.  Patient will be able to stand > 5 minutes with pain not to exceed 3/10 in lumbar to improve ability to perform cooking and grooming ADLs. Baseline: 1 minute Current 1-2 minutes 09/22/21 Goal status: ongoing   3.  Patient will be able to ambulate at least 150 feet with LRAD to demonstrate improved ability to perform functional mobility and associated tasks. Baseline: about 10 feet with HHA x 1   42 feet with wheelchair follow at SBA 09/22/21 Goal status: ongoing   4. Patient will have equal to or > 4+/5 MMT throughout BLE to improve ability to perform functional mobility, stair ambulation and ADLs.  Baseline: See MMTs Goal status: ongoing       PLAN: PT FREQUENCY: 2x/week   PT  DURATION: 6 weeks   PLANNED INTERVENTIONS: Therapeutic exercises, Therapeutic activity, Neuromuscular re-education, Balance training, Gait training, Patient/Family education, Joint manipulation, Joint mobilization, Stair training, Aquatic Therapy, Dry Needling, Electrical stimulation, Spinal manipulation, Spinal mobilization, Cryotherapy, Moist heat, scar mobilization, Taping, Traction, Ultrasound, Biofeedback, Ionotophoresis 51m/ml Dexamethasone, and Manual therapy. .Marland Kitchen  PLAN FOR NEXT SESSION:  re-assess, begin walking.  LE strength and ankle mobility. Progress standing activities, posture, core strength, gait and balance.     12:33 PM, 09/22/21 Kimsey Demaree PT, DPT

## 2021-09-26 ENCOUNTER — Ambulatory Visit (HOSPITAL_COMMUNITY): Payer: Medicaid Other

## 2021-09-26 DIAGNOSIS — Z789 Other specified health status: Secondary | ICD-10-CM

## 2021-09-26 DIAGNOSIS — R262 Difficulty in walking, not elsewhere classified: Secondary | ICD-10-CM | POA: Diagnosis not present

## 2021-09-26 DIAGNOSIS — R29898 Other symptoms and signs involving the musculoskeletal system: Secondary | ICD-10-CM

## 2021-09-26 DIAGNOSIS — Z7409 Other reduced mobility: Secondary | ICD-10-CM

## 2021-09-26 NOTE — Therapy (Signed)
OUTPATIENT PHYSICAL THERAPY TREATMENT NOTE   Patient Name: [name] MRN: [id number] DOB:08-28-1985, 36 y.o., male Today's Date: 09/26/2021  PCP: Neale Burly, MD REFERRING PROVIDER: Neale Burly, MD  END OF SESSION:   PT End of Session - 09/26/21 1300     Visit Number 10    Number of Visits 21    Date for PT Re-Evaluation 11/03/21    Authorization Type [place] [employer]    Authorization Time Period 3 visits approved from 6/8 to 12/5    Authorization - Visit Number 9    Authorization - Number of Visits 11    Progress Note Due on Visit 11    PT Start Time 1302    PT Stop Time 1345    PT Time Calculation (min) 43 min    Activity Tolerance Patient limited by pain;Patient tolerated treatment well;Patient limited by fatigue    Behavior During Therapy [name] for tasks assessed/performed                Past Medical History:  Diagnosis Date   Acute blood loss anemia 09/02/2012   S/p 1 unit rbcs   Cellulitis    Erosive esophagitis 09/01/2012   NSAID induced.   Gall stones    Gastric ulcer with hemorrhage 09/02/2012   s/p bleeding control tx. Per Dr. Gala Romney   Gout    Obesity    UTI (lower urinary tract infection)    Past Surgical History:  Procedure Laterality Date   COLONOSCOPY WITH PROPOFOL N/A 07/30/2014   RMR: Colonoscopy anal canal/internal hemorrhoids, otherwise normal ileocolonoscoopy.    ESOPHAGOGASTRODUODENOSCOPY Left 09/01/2012   TKP:[id number] esophageal erosions consistent with erosive reflux esophagitis/Pre-pyloric benign-appearing gastric ulcer with bleeding stigmata status post bleeding control therapy as described above   ESOPHAGOGASTRODUODENOSCOPY N/A 12/13/2012   LEX:[id number] hernia. PReviously noted gastric ulcer completely healed.    ESOPHAGOGASTRODUODENOSCOPY (EGD) WITH PROPOFOL N/A 07/30/2014   Procedure: ESOPHAGOGASTRODUODENOSCOPY (EGD) WITH PROPOFOL;  Surgeon: Daneil Dolin, MD;  Location: [facility];  Service: Endoscopy;  Laterality: N/A;    TONSILLECTOMY     Patient Active Problem List   Diagnosis Date Noted   Gout 12/29/2017   Tobacco abuse 12/29/2017   Acute hypoxemic respiratory failure ([place]) 12/29/2017   Bronchitis 12/29/2017   Rectal bleed 07/30/2014   Hiatal hernia    Hemorrhoid    Acute lower GI bleeding 07/29/2014   Abdominal pain    Wrist pain, acute    Tenosynovitis of finger, hand, or wrist    Cellulitis of hand 03/10/2014   Right upper quadrant abdominal pain 02/10/2014   Cholelithiasis 02/10/2014   Polyarthritis 11/08/2012   UTI (lower urinary tract infection) 10/21/2012   Cellulitis 09/17/2012   Tinea pedis 09/17/2012   Erosive esophagitis 09/07/2012   Hypokalemia 09/05/2012   Gastric ulcer with hemorrhage 09/02/2012   Upper GI bleed 09/02/2012   Acute blood loss anemia 09/02/2012   Hematemesis 09/01/2012   Normocytic anemia 09/01/2012   Acute gouty arthritis 09/01/2012   Morbid obesity ([place]) 09/01/2012    REFERRING DIAG: PT eval/tx for LBP   THERAPY DIAG:  Difficulty in walking, not elsewhere classified  Bilateral leg weakness  Impaired mobility and ADLs  Impaired functional mobility and endurance  PERTINENT HISTORY: Gout and Gastrointestinal hemorrhage  PRECAUTIONS: none  SUBJECTIVE: Patient states ankle is staying swollen the last couple weeks.  He has an appointment with his MD Wednesday. Patient reports pain is mostly laterally right ankle.    PAIN:  Are you having  pain? Yes: NPRS scale: seated 7/10 Pain location: Right foot and ankle Pain description: sharp stabbing and some dull ache Aggravating factors: movement Relieving factors: rest   OBJECTIVE: (objective measures completed at initial evaluation unless otherwise dated)   DIAGNOSTIC FINDINGS:  NA     COGNITION:           Overall cognitive status: Within functional limits for tasks assessed                     LUMBAR ROM:    Active  A/PROM  08/10/2021 AROM 09/22/21  Flexion 50% limited due to balance  50%  limited    Extension 100% limited (loses balance)  80% limited (stays in forward flexed posture)  Right lateral flexion     Left lateral flexion     Right rotation     Left rotation      (Blank rows = not tested)       LE MMT: Ankle DF limited by decreased AROM, hip extension tested in sidelying    MMT Right 08/10/2021 Left 08/10/2021 Right 09/22/21 Left  09/22/21  Hip flexion 5 5 5 5   Hip extension 4 4 4 4   Hip abduction 4 4+ 4 4+  Hip adduction        Hip internal rotation        Hip external rotation        Knee flexion        Knee extension 4+ 4+ 4+ 5  Ankle dorsiflexion 3- 3- No active and passive range in right ankle,hard end feel  4  Ankle plantarflexion        Ankle inversion        Ankle eversion         (Blank rows = not tested)     GAIT: Distance walked: 42 feet Assistive device utilized:  no ad, near wall, with wheelchair follow Level of assistance: SBA Comments: Forward flexed trunk, fatigues quickly, waddles, decreased step length  Sit to stand at SBA       TODAY'S TREATMENT  09/26/2021 Seated Seated heel slides x 1' bilaterally Seated hamstring/calf stretch with strap 10 x 10" bilaterally LAQ's x 10 each   Standing: Sit to stand x 5 to // bars Marching x 10 Hip extension x 10 Hip abduction x 10  Nustep lv7 5 min seat 12         09/22/21   Reassessment of transfers, mmt, walking                 09/15/2021              Standing:              Heel raises x 10              Toe raises x 1              Marching  x 10              Functional squat x 10              Sitting:                          Baps level 3 Dorsi/plantar, in/eversion and around the world x 10 each                          Heel  slides Rt x 5                          Towel crunch x 3                           B ankle DF/PF x 10                           B ankle inversion/eversion x 10               Sit to stand x5  09/13/21 Nustep lv5 5 min seat 12  Standing march  x20 Standing hip abduction/ extension x20 each   Seated HS curl using bodycraft 7 plates 3 x 10 Seated leg extension with bodycraft  7 plates 3 x 10  3/97/67 Gait to Nustep 62f no AD Nustep [place] level 3, resistance 5 x 5 min BTB shoulder extension BTB rows 15 BTB paloff 15x 2 Standing marching 2x 10 Standing hip abd with BTB around thigh 2x 10 Standing hip extension 2x 10 Seated quadricpes extension 7Pl 3x 10 with body craft machine    PATIENT EDUCATION:  Education details:  HEP  Person educated: Patient Education method: Explanation Education comprehension: verbalized understanding     HOME EXERCISE PROGRAM:               09/15/21:   [id number] crunch, ankle dorsiflexion/plantarflexion, ankle in/eversion  Access Code: [email or web address]: 08/10/2021 - Seated Calf Stretch with Strap  - 3 x daily - 7 x weekly - 1 sets - 5 reps - 20 second hold - Sit to Stand with Counter Support  - 3 x daily - 7 x weekly - 3 sets - 5 reps - Sidelying Hip Abduction  - 3 x daily - 7 x weekly - 2 sets - 10 reps 5/2 - Supine Bridge  - 1 x daily - 7 x weekly - 3 sets - 10 reps - Supine Straight Leg Raises  - 1 x daily - 7 x weekly - 3 sets - 10 reps   ASSESSMENT:   CLINICAL IMPRESSION: Patient reports increased pain over the last 2 weeks Right foot and ankle.  He reports that this pain is now constant and swelling is more persistent. He is able to increase intensity of Nustep level today.  Stands 2 min and 33 sec for exercise before needing a rest. Explained to patient that only 3 further visits approved so will establish good HEP before discharge.   Patient will continue to benefit from skilled PT services to improve strength and functional mobility.   OBJECTIVE IMPAIRMENTS Abnormal gait, decreased activity tolerance, decreased balance, decreased endurance, decreased knowledge of use of DME, decreased mobility, difficulty walking, decreased ROM, decreased strength, improper body mechanics, obesity, and  pain.    ACTIVITY LIMITATIONS cleaning, community activity, driving, meal prep, laundry, yard work, shopping, and yard work.    PERSONAL FACTORS Past/current experiences and    are also affecting patient's functional outcome.      REHAB POTENTIAL: Fair Past experience, obesity, chronicity    CLINICAL DECISION MAKING: Stable/uncomplicated   EVALUATION COMPLEXITY: Low     GOALS:   SHORT TERM GOALS: Target date: 08/31/2021   Patient will be independent with initial HEP and self-management strategies to improve functional outcomes Baseline: mostly independent, needs help with set up of bands sometimes  Goal status: Met  LONG TERM GOALS: Target date: 11/03/21   Patient will be independent with advanced HEP and self-management strategies to improve functional outcomes Baseline: mostly independent, needs help with set up of bands sometimes  Goal status: ongoing   2.  Patient will be able to stand > 5 minutes with pain not to exceed 3/10 in lumbar to improve ability to perform cooking and grooming ADLs. Baseline: 1 minute Current 2:33 sec 09/26/21 Goal status: ongoing   3.  Patient will be able to ambulate at least 150 feet with LRAD to demonstrate improved ability to perform functional mobility and associated tasks. Baseline: about 10 feet with HHA x 1   42 feet with wheelchair follow at SBA 09/22/21 Goal status: ongoing   4. Patient will have equal to or > 4+/5 MMT throughout BLE to improve ability to perform functional mobility, stair ambulation and ADLs.  Baseline: See MMTs Goal status: ongoing       PLAN: PT FREQUENCY: 2x/week   PT DURATION: 6 weeks   PLANNED INTERVENTIONS: Therapeutic exercises, Therapeutic activity, Neuromuscular re-education, Balance training, Gait training, Patient/Family education, Joint manipulation, Joint mobilization, Stair training, Aquatic Therapy, Dry Needling, Electrical stimulation, Spinal manipulation, Spinal mobilization, Cryotherapy,  Moist heat, scar mobilization, Taping, Traction, Ultrasound, Biofeedback, Ionotophoresis 44m/ml Dexamethasone, and Manual therapy. .Marland Kitchen  PLAN FOR NEXT SESSION:2 more approved visits and then discharge so focus on making sure HEP is well established.    1:43 PM, 09/26/21 Keyshon Stein Small Yuri Fana MPT [facility] [place] #[phone]

## 2021-09-28 ENCOUNTER — Encounter (HOSPITAL_COMMUNITY): Payer: Self-pay | Admitting: Physical Therapy

## 2021-09-28 ENCOUNTER — Ambulatory Visit (HOSPITAL_COMMUNITY): Payer: Medicaid Other | Attending: Internal Medicine | Admitting: Physical Therapy

## 2021-09-28 DIAGNOSIS — R262 Difficulty in walking, not elsewhere classified: Secondary | ICD-10-CM | POA: Diagnosis present

## 2021-09-28 DIAGNOSIS — R29898 Other symptoms and signs involving the musculoskeletal system: Secondary | ICD-10-CM | POA: Insufficient documentation

## 2021-09-28 DIAGNOSIS — Z7409 Other reduced mobility: Secondary | ICD-10-CM

## 2021-09-28 NOTE — Therapy (Signed)
OUTPATIENT PHYSICAL THERAPY TREATMENT NOTE   Patient Name: [name] MRN: [id number] DOB:02/21/1986, 36 y.o., male Today's Date: 09/28/2021  PCP: Neale Burly, MD REFERRING PROVIDER: Neale Burly, MD  END OF SESSION:   PT End of Session - 09/28/21 1144     Visit Number 11    Number of Visits 21    Date for PT Re-Evaluation 11/03/21    Authorization Type [place] [employer]    Authorization Time Period 3 visits approved from 6/8 to 12/5    Authorization - Visit Number 2    Authorization - Number of Visits 3    Progress Note Due on Visit 19    PT Start Time 1140   arrive late/delayed check in   PT Stop Time 1215    PT Time Calculation (min) 35 min    Activity Tolerance Patient limited by pain;Patient tolerated treatment well;Patient limited by fatigue    Behavior During Therapy [facility] for tasks assessed/performed                Past Medical History:  Diagnosis Date   Acute blood loss anemia 09/02/2012   S/p 1 unit rbcs   Cellulitis    Erosive esophagitis 09/01/2012   NSAID induced.   Gall stones    Gastric ulcer with hemorrhage 09/02/2012   s/p bleeding control tx. Per Dr. Gala Romney   Gout    Obesity    UTI (lower urinary tract infection)    Past Surgical History:  Procedure Laterality Date   COLONOSCOPY WITH PROPOFOL N/A 07/30/2014   RMR: Colonoscopy anal canal/internal hemorrhoids, otherwise normal ileocolonoscoopy.    ESOPHAGOGASTRODUODENOSCOPY Left 09/01/2012   VHQ:IONGEX esophageal erosions consistent with erosive reflux esophagitis/Pre-pyloric benign-appearing gastric ulcer with bleeding stigmata status post bleeding control therapy as described above   ESOPHAGOGASTRODUODENOSCOPY N/A 12/13/2012   [id number] hernia. PReviously noted gastric ulcer completely healed.    ESOPHAGOGASTRODUODENOSCOPY (EGD) WITH PROPOFOL N/A 07/30/2014   Procedure: ESOPHAGOGASTRODUODENOSCOPY (EGD) WITH PROPOFOL;  Surgeon: Daneil Dolin, MD;  Location: [facility];  Service:  Endoscopy;  Laterality: N/A;   TONSILLECTOMY     Patient Active Problem List   Diagnosis Date Noted   Gout 12/29/2017   Tobacco abuse 12/29/2017   Acute hypoxemic respiratory failure ([place]) 12/29/2017   Bronchitis 12/29/2017   Rectal bleed 07/30/2014   Hiatal hernia    Hemorrhoid    Acute lower GI bleeding 07/29/2014   Abdominal pain    Wrist pain, acute    Tenosynovitis of finger, hand, or wrist    Cellulitis of hand 03/10/2014   Right upper quadrant abdominal pain 02/10/2014   Cholelithiasis 02/10/2014   Polyarthritis 11/08/2012   UTI (lower urinary tract infection) 10/21/2012   Cellulitis 09/17/2012   Tinea pedis 09/17/2012   Erosive esophagitis 09/07/2012   Hypokalemia 09/05/2012   Gastric ulcer with hemorrhage 09/02/2012   Upper GI bleed 09/02/2012   Acute blood loss anemia 09/02/2012   Hematemesis 09/01/2012   Normocytic anemia 09/01/2012   Acute gouty arthritis 09/01/2012   Morbid obesity ([place]) 09/01/2012    REFERRING DIAG: PT eval/tx for LBP   THERAPY DIAG:  No diagnosis found.  PERTINENT HISTORY: Gout and Gastrointestinal hemorrhage  PRECAUTIONS: none  SUBJECTIVE:  Patient reports p back is really bothering him today and he keeps getting spasms in R middle back   PAIN:  Are you having pain? Yes: NPRS scale: seated 7/10 Pain location: Right foot and ankle Pain description: sharp stabbing and some dull ache Aggravating factors:  movement Relieving factors: rest   OBJECTIVE: (objective measures completed at initial evaluation unless otherwise dated)   DIAGNOSTIC FINDINGS:  NA     COGNITION:           Overall cognitive status: Within functional limits for tasks assessed                     LUMBAR ROM:    Active  A/PROM  08/10/2021 AROM 09/22/21  Flexion 50% limited due to balance  50% limited    Extension 100% limited (loses balance)  80% limited (stays in forward flexed posture)  Right lateral flexion     Left lateral flexion     Right  rotation     Left rotation      (Blank rows = not tested)       LE MMT: Ankle DF limited by decreased AROM, hip extension tested in sidelying    MMT Right 08/10/2021 Left 08/10/2021 Right 09/22/21 Left  09/22/21  Hip flexion _0 Hip extension _1 Hip abduction 4 4+ 4 4+  Hip adduction        Hip internal rotation        Hip external rotation        Knee flexion        Knee extension 4+ 4+ 4+ 5  Ankle dorsiflexion 3- 3- No active and passive range in right ankle,hard end feel  4  Ankle plantarflexion        Ankle inversion        Ankle eversion         (Blank rows = not tested)     GAIT: Distance walked: 42 feet Assistive device utilized:  no ad, near wall, with wheelchair follow Level of assistance: SBA Comments: Forward flexed trunk, fatigues quickly, waddles, decreased step length  Sit to stand at SBA       TODAY'S TREATMENT  09/28/21 Seated trunk flexion stretch 10x 5 second holds Marching x 10  Step up 2x 10 bilateral 4 inch step Hip extension x 10 Hip abduction x 10 Hamstring curls 7 plates 2x 20 bilateral  Shoulder Row black TB 1x 15 Shoulder extension black TB 1x 15    09/26/2021 Seated Seated heel slides x 1' bilaterally Seated hamstring/calf stretch with strap 10 x 10" bilaterally LAQ's x 10 each   Standing: Sit to stand x 5 to // bars Marching x 10 Hip extension x 10 Hip abduction x 10  Nustep lv7 5 min seat 12         09/22/21   Reassessment of transfers, mmt, walking                 09/15/2021              Standing:              Heel raises x 10              Toe raises x 1              Marching  x 10              Functional squat x 10              Sitting:                          Baps level 3 Dorsi/plantar, in/eversion and around the  world x 10 each                          Heel slides Rt x 5                          Towel crunch x 3                           B ankle DF/PF x 10                           B ankle  inversion/eversion x 10               Sit to stand x5  09/13/21 Nustep lv5 5 min seat 12  Standing march x20 Standing hip abduction/ extension x20 each   Seated HS curl using bodycraft 7 plates 3 x 10 Seated leg extension with bodycraft  7 plates 3 x 10  0/03/49 Gait to Nustep 37f no AD Nustep [place] level 3, resistance 5 x 5 min BTB shoulder extension BTB rows 15 BTB paloff 15x 2 Standing marching 2x 10 Standing hip abd with BTB around thigh 2x 10 Standing hip extension 2x 10 Seated quadricpes extension 7Pl 3x 10 with body craft machine    PATIENT EDUCATION:  Education details:  HEP  Person educated: Patient Education method: Explanation Education comprehension: verbalized understanding     HOME EXERCISE PROGRAM:               09/15/21:   [id number] crunch, ankle dorsiflexion/plantarflexion, ankle in/eversion  Access Code: [email or web address]: 08/10/2021 - Seated Calf Stretch with Strap  - 3 x daily - 7 x weekly - 1 sets - 5 reps - 20 second hold - Sit to Stand with Counter Support  - 3 x daily - 7 x weekly - 3 sets - 5 reps - Sidelying Hip Abduction  - 3 x daily - 7 x weekly - 2 sets - 10 reps 5/2 - Supine Bridge  - 1 x daily - 7 x weekly - 3 sets - 10 reps - Supine Straight Leg Raises  - 1 x daily - 7 x weekly - 3 sets - 10 reps   ASSESSMENT:   CLINICAL IMPRESSION: Session limited by patient late arrival/delayed check in. Continued LE strengthening which is tolerated well and able to complete increased reps of previously completed exercises. Resisted postural strengthening completed for improving standing and activity tolerance. Anticipate d/c next session due to limited number of approved visits. Patient will continue to benefit from skilled PT services to improve strength and functional mobility.   OBJECTIVE IMPAIRMENTS Abnormal gait, decreased activity tolerance, decreased balance, decreased endurance, decreased knowledge of use of DME, decreased mobility, difficulty  walking, decreased ROM, decreased strength, improper body mechanics, obesity, and pain.    ACTIVITY LIMITATIONS cleaning, community activity, driving, meal prep, laundry, yard work, shopping, and yard work.    PERSONAL FACTORS Past/current experiences and    are also affecting patient's functional outcome.      REHAB POTENTIAL: Fair Past experience, obesity, chronicity    CLINICAL DECISION MAKING: Stable/uncomplicated   EVALUATION COMPLEXITY: Low     GOALS:   SHORT TERM GOALS: Target date: 08/31/2021   Patient will be independent with initial HEP and self-management strategies to improve functional outcomes Baseline: mostly independent, needs help with set  up of bands sometimes  Goal status: Met       LONG TERM GOALS: Target date: 11/03/21   Patient will be independent with advanced HEP and self-management strategies to improve functional outcomes Baseline: mostly independent, needs help with set up of bands sometimes  Goal status: ongoing   2.  Patient will be able to stand > 5 minutes with pain not to exceed 3/10 in lumbar to improve ability to perform cooking and grooming ADLs. Baseline: 1 minute Current 2:33 sec 09/26/21 Goal status: ongoing   3.  Patient will be able to ambulate at least 150 feet with LRAD to demonstrate improved ability to perform functional mobility and associated tasks. Baseline: about 10 feet with HHA x 1   42 feet with wheelchair follow at SBA 09/22/21 Goal status: ongoing   4. Patient will have equal to or > 4+/5 MMT throughout BLE to improve ability to perform functional mobility, stair ambulation and ADLs.  Baseline: See MMTs Goal status: ongoing       PLAN: PT FREQUENCY: 2x/week   PT DURATION: 6 weeks   PLANNED INTERVENTIONS: Therapeutic exercises, Therapeutic activity, Neuromuscular re-education, Balance training, Gait training, Patient/Family education, Joint manipulation, Joint mobilization, Stair training, Aquatic Therapy, Dry Needling,  Electrical stimulation, Spinal manipulation, Spinal mobilization, Cryotherapy, Moist heat, scar mobilization, Taping, Traction, Ultrasound, Biofeedback, Ionotophoresis 69m/ml Dexamethasone, and Manual therapy. .Marland Kitchen  PLAN FOR NEXT SESSION: 1 more approved visits and then discharge so focus on making sure HEP is well established. D/c next session   11:46 AM, 09/28/21 AMearl LatinPT, DPT Physical Therapist [facility]

## 2021-10-05 ENCOUNTER — Ambulatory Visit (HOSPITAL_COMMUNITY): Payer: Medicaid Other | Attending: Physical Therapy | Admitting: Physical Therapy

## 2021-10-13 ENCOUNTER — Ambulatory Visit (HOSPITAL_COMMUNITY): Payer: Medicaid Other | Admitting: Physical Therapy

## 2021-10-13 DIAGNOSIS — R29898 Other symptoms and signs involving the musculoskeletal system: Secondary | ICD-10-CM

## 2021-10-13 DIAGNOSIS — Z789 Other specified health status: Secondary | ICD-10-CM

## 2021-10-13 DIAGNOSIS — R262 Difficulty in walking, not elsewhere classified: Secondary | ICD-10-CM

## 2021-10-13 DIAGNOSIS — Z7409 Other reduced mobility: Secondary | ICD-10-CM

## 2021-10-13 NOTE — Therapy (Addendum)
OUTPATIENT PHYSICAL THERAPY TREATMENT NOTE   Patient Name: [name] MRN: [id number] DOB:[birth year], 36 y.o., male Today's Date: 10/13/2021  PCP: Neale Burly, MD REFERRING PROVIDER: Neale Burly, MD PHYSICAL THERAPY DISCHARGE SUMMARY  Visits from Start of Care: 12  Current functional level related to goals / functional outcomes: See below   Remaining deficits: See below   Education / Equipment: See assessment   Patient agrees to discharge. Patient goals were partially met. Patient is being discharged due to maximized rehab potential.   END OF SESSION:   PT End of Session - 10/13/21 1139     Visit Number 12    Number of Visits 21    Date for PT Re-Evaluation 11/03/21    Authorization Type [place] [employer]    Authorization Time Period 3 visits approved from 6/8 to 12/5    Authorization - Visit Number 3    Authorization - Number of Visits 3    Progress Note Due on Visit 19    PT Start Time 1140    PT Stop Time 1210    PT Time Calculation (min) 30 min    Activity Tolerance Patient limited by pain;Patient tolerated treatment well;Patient limited by fatigue    Behavior During Therapy [facility] for tasks assessed/performed                Past Medical History:  Diagnosis Date   Acute blood loss anemia 09/02/2012   S/p 1 unit rbcs   Cellulitis    Erosive esophagitis 09/01/2012   NSAID induced.   Gall stones    Gastric ulcer with hemorrhage 09/02/2012   s/p bleeding control tx. Per Dr. Gala Romney   Gout    Obesity    UTI (lower urinary tract infection)    Past Surgical History:  Procedure Laterality Date   COLONOSCOPY WITH PROPOFOL N/A 07/30/2014   RMR: Colonoscopy anal canal/internal hemorrhoids, otherwise normal ileocolonoscoopy.    ESOPHAGOGASTRODUODENOSCOPY Left 09/01/2012   WPY:[id number] esophageal erosions consistent with erosive reflux esophagitis/Pre-pyloric benign-appearing gastric ulcer with bleeding stigmata status post bleeding control therapy  as described above   ESOPHAGOGASTRODUODENOSCOPY N/A 12/13/2012   [id number] hernia. PReviously noted gastric ulcer completely healed.    ESOPHAGOGASTRODUODENOSCOPY (EGD) WITH PROPOFOL N/A 07/30/2014   Procedure: ESOPHAGOGASTRODUODENOSCOPY (EGD) WITH PROPOFOL;  Surgeon: Daneil Dolin, MD;  Location: [facility];  Service: Endoscopy;  Laterality: N/A;   TONSILLECTOMY     Patient Active Problem List   Diagnosis Date Noted   Gout 12/29/2017   Tobacco abuse 12/29/2017   Acute hypoxemic respiratory failure ([place]) 12/29/2017   Bronchitis 12/29/2017   Rectal bleed 07/30/2014   Hiatal hernia    Hemorrhoid    Acute lower GI bleeding 07/29/2014   Abdominal pain    Wrist pain, acute    Tenosynovitis of finger, hand, or wrist    Cellulitis of hand 03/10/2014   Right upper quadrant abdominal pain 02/10/2014   Cholelithiasis 02/10/2014   Polyarthritis 11/08/2012   UTI (lower urinary tract infection) 10/21/2012   Cellulitis 09/17/2012   Tinea pedis 09/17/2012   Erosive esophagitis 09/07/2012   Hypokalemia 09/05/2012   Gastric ulcer with hemorrhage 09/02/2012   Upper GI bleed 09/02/2012   Acute blood loss anemia 09/02/2012   Hematemesis 09/01/2012   Normocytic anemia 09/01/2012   Acute gouty arthritis 09/01/2012   Morbid obesity ([place]) 09/01/2012    REFERRING DIAG: PT eval/tx for LBP   THERAPY DIAG:  No diagnosis found.  PERTINENT HISTORY: Gout and Gastrointestinal hemorrhage  PRECAUTIONS: none  SUBJECTIVE:  Patient reports he plans on starting to go to the gym with his nephew.  Reports he is compliant with his HEP and has no questions. States he is ready for discharge.   PAIN:  Are you having pain? Yes: NPRS scale: seated 7/10 Pain location: Right foot and ankle Pain description: sharp stabbing and some dull ache Aggravating factors: movement Relieving factors: rest   OBJECTIVE: (objective measures completed at initial evaluation unless otherwise dated)   DIAGNOSTIC FINDINGS:   NA     COGNITION:           Overall cognitive status: Within functional limits for tasks assessed                     LUMBAR ROM:    Active  A/PROM  08/10/2021 AROM 09/22/21 AROM 10/13/21  Flexion 50% limited due to balance  50% limited   WNL can go to floor  Extension 100% limited (loses balance)  80% limited (stays in forward flexed posture) Neutral  only  Right lateral flexion    Decreased 25%  Left lateral flexion    WNL  Right rotation    Decreased 40%  Left rotation    Decreased 40%   (Blank rows = not tested)       LE MMT: Ankle DF limited by decreased AROM, hip extension tested in sidelying    MMT Right 08/10/2021 Left 08/10/2021 Right 09/22/21 Right 10/13/21 Left  09/22/21 Left 10/13/21  Hip flexion 5 5 5 5 5 5   Hip extension 4 4 4 5 4 5   Hip abduction 4 4+ 4 4+ 4+ 5  Hip adduction          Hip internal rotation          Hip external rotation          Knee flexion          Knee extension 4+ 4+ 4+ 5 5 5   Ankle dorsiflexion 3- 3- No active and passive range in right ankle,hard end feel  3- Limited ROM 4 4 Limited ROM  Ankle plantarflexion          Ankle inversion          Ankle eversion           (Blank rows = not tested)     GAIT: Distance walked: 42 feet Assistive device utilized:  no ad, near wall, with wheelchair follow Level of assistance: SBA Comments: Forward flexed trunk, fatigues quickly, waddles, decreased step length  Sit to stand at SBA       TODAY'S TREATMENT  10/13/21 Reassessment;discharge  09/28/21 Seated trunk flexion stretch 10x 5 second holds Marching x 10  Step up 2x 10 bilateral 4 inch step Hip extension x 10 Hip abduction x 10 Hamstring curls 7 plates 2x 20 bilateral  Shoulder Row black TB 1x 15 Shoulder extension black TB 1x 15    09/26/2021 Seated Seated heel slides x 1' bilaterally Seated hamstring/calf stretch with strap 10 x 10" bilaterally LAQ's x 10 each   Standing: Sit to stand x 5 to // bars Marching x  10 Hip extension x 10 Hip abduction x 10  Nustep lv7 5 min seat 12         09/22/21   Reassessment of transfers, mmt, walking                 09/15/2021  Standing:              Heel raises x 10              Toe raises x 1              Marching  x 10              Functional squat x 10              Sitting:                          Baps level 3 Dorsi/plantar, in/eversion and around the world x 10 each                          Heel slides Rt x 5                          Towel crunch x 3                           B ankle DF/PF x 10                           B ankle inversion/eversion x 10               Sit to stand x5  09/13/21 Nustep lv5 5 min seat 12  Standing march x20 Standing hip abduction/ extension x20 each   Seated HS curl using bodycraft 7 plates 3 x 10 Seated leg extension with bodycraft  7 plates 3 x 10  12/19/98 Gait to Nustep 25f no AD Nustep [place] level 3, resistance 5 x 5 min BTB shoulder extension BTB rows 15 BTB paloff 15x 2 Standing marching 2x 10 Standing hip abd with BTB around thigh 2x 10 Standing hip extension 2x 10 Seated quadricpes extension 7Pl 3x 10 with body craft machine    PATIENT EDUCATION:  Education details:  HEP  Person educated: Patient Education method: Explanation Education comprehension: verbalized understanding     HOME EXERCISE PROGRAM: 10/13/21:  standing tolerance, gait, standing squats, UE theraband exercises, marching               09/15/21:   [id number] crunch, ankle dorsiflexion/plantarflexion, ankle in/eversion  Access Code: [email or web address]: 08/10/2021 - Seated Calf Stretch with Strap  - 3 x daily - 7 x weekly - 1 sets - 5 reps - 20 second hold - Sit to Stand with Counter Support  - 3 x daily - 7 x weekly - 3 sets - 5 reps - Sidelying Hip Abduction  - 3 x daily - 7 x weekly - 2 sets - 10 reps 5/2 - Supine Bridge  - 1 x daily - 7 x weekly - 3 sets - 10 reps - Supine Straight Leg Raises  - 1 x daily - 7 x  weekly - 3 sets - 10 reps   ASSESSMENT:   CLINICAL IMPRESSION: Re-evaluation and discharge completed this session. Pt reports not having his best day today so did not challenge his walking per request. Pt has completed 12 visits in the last 9 weeks, 3 cancellations and 1 no-show.  Pt has ambulated a max of 180 feet since coming without AD and 2 standing rest breaks.  MMT measured much better as well as  lumbar ROM.  Pt has met 2/4 goals and is compliant and independent with an advanced HEP.  Encouraged to continue to progress his activity and to join a gym to help with compliance.   OBJECTIVE IMPAIRMENTS Abnormal gait, decreased activity tolerance, decreased balance, decreased endurance, decreased knowledge of use of DME, decreased mobility, difficulty walking, decreased ROM, decreased strength, improper body mechanics, obesity, and pain.    ACTIVITY LIMITATIONS cleaning, community activity, driving, meal prep, laundry, yard work, shopping, and yard work.    PERSONAL FACTORS Past/current experiences and    are also affecting patient's functional outcome.      REHAB POTENTIAL: Fair Past experience, obesity, chronicity    CLINICAL DECISION MAKING: Stable/uncomplicated   EVALUATION COMPLEXITY: Low     GOALS:   SHORT TERM GOALS: Target date: 08/31/2021   Patient will be independent with initial HEP and self-management strategies to improve functional outcomes Baseline: mostly independent, needs help with set up of bands sometimes  Goal status: MET       LONG TERM GOALS: Target date: 11/03/21   Patient will be independent with advanced HEP and self-management strategies to improve functional outcomes Baseline: mostly independent, needs help with set up of bands sometimes  Goal status: MET   2.  Patient will be able to stand > 5 minutes with pain not to exceed 3/10 in lumbar to improve ability to perform cooking and grooming ADLs. Baseline: 1 minute Current 2:33 sec 09/26/21, 2:30  10/13/21 Goal status: NOT MET due to back pain having to set down at less than 2.5 minutes   3.  Patient will be able to ambulate at least 150 feet with LRAD to demonstrate improved ability to perform functional mobility and associated tasks. Baseline: about 10 feet with HHA x 1; 42 feet with wheelchair follow at SBA 09/22/21; 6/29:  unable to complete this session but has walked max of 180 feet without AD with 2 short standing rest breaks Goal status: MET   4. Patient will have equal to or > 4+/5 MMT throughout BLE to improve ability to perform functional mobility, stair ambulation and ADLs.  Baseline: See MMTs Goal status: PARTLY MET; did not meet for ankle but did for hips and quadriceps       PLAN: PT FREQUENCY: 2x/week   PT DURATION: 6 weeks   PLANNED INTERVENTIONS: Therapeutic exercises, Therapeutic activity, Neuromuscular re-education, Balance training, Gait training, Patient/Family education, Joint manipulation, Joint mobilization, Stair training, Aquatic Therapy, Dry Needling, Electrical stimulation, Spinal manipulation, Spinal mobilization, Cryotherapy, Moist heat, scar mobilization, Taping, Traction, Ultrasound, Biofeedback, Ionotophoresis 65m/ml Dexamethasone, and Manual therapy.   PLAN FOR NEXT SESSION: discharge to HEP   11:40 AM, 10/13/21 ATeena Irani PTA/CLT [place]ans[place]Ph: [phone]

## 2021-10-19 ENCOUNTER — Encounter (HOSPITAL_COMMUNITY): Payer: Medicaid Other | Admitting: Physical Therapy

## 2021-10-21 ENCOUNTER — Encounter (HOSPITAL_COMMUNITY): Payer: Medicaid Other | Admitting: Physical Therapy

## 2021-10-24 ENCOUNTER — Encounter (HOSPITAL_COMMUNITY): Payer: Medicaid Other

## 2021-10-26 ENCOUNTER — Encounter (HOSPITAL_COMMUNITY): Payer: Medicaid Other

## 2021-11-01 ENCOUNTER — Encounter (HOSPITAL_COMMUNITY): Payer: Medicaid Other | Admitting: Physical Therapy

## 2021-11-03 ENCOUNTER — Encounter (HOSPITAL_COMMUNITY): Payer: Medicaid Other | Admitting: Physical Therapy

## 2021-11-08 ENCOUNTER — Encounter (HOSPITAL_COMMUNITY): Payer: Medicaid Other

## 2021-11-10 ENCOUNTER — Encounter (HOSPITAL_COMMUNITY): Payer: Medicaid Other | Admitting: Physical Therapy

## 2021-11-15 ENCOUNTER — Encounter (HOSPITAL_COMMUNITY): Payer: Medicaid Other | Admitting: Physical Therapy

## 2021-11-17 ENCOUNTER — Encounter (HOSPITAL_COMMUNITY): Payer: Medicaid Other | Admitting: Physical Therapy

## 2022-04-23 ENCOUNTER — Encounter (HOSPITAL_COMMUNITY): Payer: Self-pay | Admitting: *Deleted

## 2022-04-23 ENCOUNTER — Emergency Department (HOSPITAL_COMMUNITY): Payer: Medicaid Other

## 2022-04-23 ENCOUNTER — Emergency Department (HOSPITAL_COMMUNITY)
Admission: EM | Admit: 2022-04-23 | Discharge: 2022-04-23 | Disposition: A | Discharge status: Home / Self Care | Payer: Medicaid Other | Attending: Emergency Medicine | Admitting: Emergency Medicine

## 2022-04-23 ENCOUNTER — Other Ambulatory Visit: Payer: Self-pay

## 2022-04-23 DIAGNOSIS — S59901A Unspecified injury of right elbow, initial encounter: Secondary | ICD-10-CM | POA: Diagnosis not present

## 2022-04-23 DIAGNOSIS — W08XXXA Fall from other furniture, initial encounter: Secondary | ICD-10-CM | POA: Diagnosis not present

## 2022-04-23 DIAGNOSIS — M25521 Pain in right elbow: Secondary | ICD-10-CM | POA: Diagnosis present

## 2022-04-23 DIAGNOSIS — S59909A Unspecified injury of unspecified elbow, initial encounter: Secondary | ICD-10-CM

## 2022-04-23 MED ORDER — OXYCODONE-ACETAMINOPHEN 5-325 MG PO TABS
1.0000 | ORAL_TABLET | Freq: Once | ORAL | Status: AC
  Administered 2022-04-23: 1 via ORAL
  Filled 2022-04-23: qty 1

## 2022-04-23 MED ORDER — OXYCODONE-ACETAMINOPHEN 5-325 MG PO TABS: 1.0000 | ORAL_TABLET | ORAL | 0 refills | Status: DC | PRN

## 2022-04-23 NOTE — ED Triage Notes (Signed)
Pt states he fell off the couch Wednesday night.  Pt c/o right elbow pain since.  Denies hitting his head with fall.

## 2022-04-23 NOTE — ED Provider Notes (Signed)
[facility] EMERGENCY DEPARTMENT Provider Note   CSN: [id number] Arrival date & time: 04/23/22  1521     History  Chief Complaint  Patient presents with   [name] is a 37 y.o. male.   Fall Pertinent negatives include no chest pain, no abdominal pain and no shortness of breath.        [name] is a 37 y.o. male who presents to the Emergency Department complaining of right elbow pain since chemical fall x 4 days ago.  States he was sleeping on the couch when he rolled over and fell off landing on his right elbow.  Since the fall, he reports pain of his elbow with stiffness.  Has been taking Tylenol without relief.  He denies pain radiating into his shoulder or hand.  He endorses chronic pain of his wrist secondary to arthritis.  Denies numbness, neck pain, head injury or LOC.    Home Medications Prior to Admission medications   Medication Sig Start Date End Date Taking? Authorizing Provider  acetaminophen (TYLENOL) 325 MG tablet Take 2-3 tablets (650-975 mg total) by mouth daily as needed for mild pain or moderate pain. 05/07/15   Mesner, Barbara Cower, MD  albuterol (PROVENTIL HFA;VENTOLIN HFA) 108 (90 Base) MCG/ACT inhaler Inhale 2 puffs into the lungs every 4 (four) hours as needed for wheezing or shortness of breath (cough, shortness of breath or wheezing.). 12/30/17   Laural Benes, Clanford L, MD  calcium carbonate (TUMS - DOSED IN MG ELEMENTAL CALCIUM) 500 MG chewable tablet Chew 1 tablet by mouth 2 (two) times daily.    [provider]  colchicine 0.6 MG tablet 1.2 mg PO at first sign of flare, then 0.6 mg 1 hr later; not to exceed 1.8 mg in 1-hr period 11/02/15   Azalia Bilis, MD  gabapentin (NEURONTIN) 300 MG capsule Take 300 mg by mouth 3 (three) times daily as needed (pain).     [provider]  predniSONE (DELTASONE) 20 MG tablet Take 3 PO QAM x3days, 2 PO QAM x5days, 1 PO QAM x5days 12/30/17   Johnson, Clanford L, MD      Allergies     Asa [aspirin], Clindamycin/lincomycin, Nsaids, Penicillins, and Sulfa antibiotics    Review of Systems   Review of Systems  Constitutional:  Negative for chills.  Respiratory:  Negative for shortness of breath.   Cardiovascular:  Negative for chest pain.  Gastrointestinal:  Negative for abdominal pain, nausea and vomiting.  Musculoskeletal:  Positive for arthralgias (Right elbow pain and swelling). Negative for back pain and neck pain.  Skin:  Negative for color change, rash and wound.  Neurological:  Negative for dizziness, weakness and numbness.    Physical Exam Updated Vital Signs BP (!) 138/92 (BP Location: Left Arm)   Pulse 85   Temp 97.6 F (36.4 C) (Oral)   Resp 19   Ht 5\' 6"  (1.676 m)   SpO2 96%   BMI 65.65 kg/m  Physical Exam Vitals and nursing note reviewed.  Constitutional:      General: He is not in acute distress.    Appearance: Normal appearance. He is not ill-appearing or toxic-appearing.  Cardiovascular:     Rate and Rhythm: Normal rate and regular rhythm.     Pulses: Normal pulses.  Pulmonary:     Effort: Pulmonary effort is normal. No respiratory distress.  Chest:     Chest wall: No tenderness.  Musculoskeletal:        General: Swelling,  tenderness and signs of injury present. No deformity.     Right elbow: Swelling present. Decreased range of motion. Tenderness present in lateral epicondyle and olecranon process.     Cervical back: Normal range of motion. No tenderness.     Comments: Tenderness palpation of the right elbow at the level of the olecranon process and lateral epicondyles.  Mild to moderate edema noted.  Limited extension due to level of pain.  No bony deformity.  Right shoulder nontender.  Patient has right wrist wrapped in an Ace wrap.  He denies injury of the right wrist stating that he keeps it wrapped because of his arthritis.  Skin:    General: Skin is warm.     Capillary Refill: Capillary refill takes less than 2 seconds.      Findings: No bruising, erythema or rash.  Neurological:     General: No focal deficit present.     Mental Status: He is alert.     Sensory: No sensory deficit.     Motor: No weakness.     ED Results / Procedures / Treatments   Labs (all labs ordered are listed, but only abnormal results are displayed) Labs Reviewed - No data to display  EKG None  Radiology DG Elbow Complete Right  Result Date: 04/23/2022 CLINICAL DATA:  Fall with elbow pain. EXAM: RIGHT ELBOW - COMPLETE 3+ VIEW COMPARISON:  Right elbow radiographs dated 05/07/2015. FINDINGS: There is no direct evidence of fracture or dislocation. The anterior fat pad appears elevated, likely reflecting an elbow joint effusion. The posterior fat pad is not visible. There is no evidence of arthropathy or other focal bone abnormality. There is soft tissue swelling of the posterior aspect of the elbow. IMPRESSION: 1. No definite acute fracture is identified. 2. An occult fracture is a consideration given the presence of a probable elbow joint effusion. Electronically Signed   By: Romona Curls M.D.   On: 04/23/2022 16:15    Procedures Procedures    Medications Ordered in ED Medications  oxyCODONE-acetaminophen (PERCOCET/ROXICET) 5-325 MG per tablet 1 tablet (has no administration in time range)    ED Course/ Medical Decision Making/ A&P                           Medical Decision Making Patient here for evaluation of right elbow injury after a direct blow.  Incident occurred 4 days ago.  Has continued pain and decreased range of motion of the elbow.  On exam, patient has limited extension of the elbow due to level of pain.  Mild to moderate edema at the olecranon process.  I do not appreciate any bony deformities.  No bruising or abrasions noted.  Vascularly intact.  Shoulder nontender  Clinical concern for fracture given patient's limited range of motion.  Musculoskeletal ligamentous injury also considered.  Possible dislocation but  felt less likely given no obvious bony deformity on exam.  Amount and/or Complexity of Data Reviewed Radiology: ordered.    Details: X-ray of the right elbow shows no definite fracture, but occult fracture considered given joint effusion Discussion of management or test interpretation with external provider(s): Discussed findings with patient, likely occult fracture given mechanism of injury and exam findings.  Neurovascularly intact.  I have recommended close outpatient follow-up with orthopedics, RICE therapy and I will provide short course of pain medication as patient cannot tolerate NSAIDs due to prior GI bleed  Risk Prescription drug management.  Final Clinical Impression(s) / ED Diagnoses Final diagnoses:  Elbow injury, initial encounter    Rx / DC Orders ED Discharge Orders     None         Kem Parkinson, PA-C 04/23/22 1733    Milton Ferguson, MD 04/24/22 [phone]

## 2022-04-23 NOTE — Discharge Instructions (Signed)
Elevate and apply ice packs on/off to your elbow.  Call the orthopedic doctor listed to arrange an appt this week

## 2022-04-25 ENCOUNTER — Telehealth: Payer: Self-pay | Admitting: Orthopedic Surgery

## 2022-04-25 NOTE — Telephone Encounter (Signed)
Returned the patient's call, lvm for him to call us to schedule.  ED on 04/23/22 for right elbow, no fracture.  Pt's # [phone]

## 2022-04-26 ENCOUNTER — Encounter: Payer: Self-pay | Admitting: Orthopedic Surgery

## 2022-04-26 ENCOUNTER — Other Ambulatory Visit: Payer: Self-pay | Admitting: Radiology

## 2022-04-26 ENCOUNTER — Ambulatory Visit: Payer: Medicaid Other | Admitting: Orthopedic Surgery

## 2022-04-26 VITALS — BP 138/92 | Ht 66.0 in | Wt >= 6400 oz

## 2022-04-26 DIAGNOSIS — S42401A Unspecified fracture of lower end of right humerus, initial encounter for closed fracture: Secondary | ICD-10-CM

## 2022-04-26 MED ORDER — HYDROCODONE-ACETAMINOPHEN 7.5-325 MG PO TABS: 1.0000 | ORAL_TABLET | Freq: Four times a day (QID) | ORAL | 0 refills | Status: AC | PRN

## 2022-04-26 NOTE — Patient Instructions (Signed)
Apply ice to your elbow for 20 min 3 x a day   Move the elbow as tolerated within the range of pain 10 x 3 x a day   Wear the sling when not exercising   Take the pain medication every 6 hrs as needed

## 2022-04-26 NOTE — Telephone Encounter (Signed)
Patient uses [facility], and I thought I selected it but apparently not, can you resent hydrocodone to [facility] [place] ?

## 2022-04-26 NOTE — Progress Notes (Signed)
New patient new injury  This is a 37 year old male with rheumatoid arthritis not seeing a rheumatologist currently  Injured on January 4 fell off a couch  He presented for care at the emergency room.  An x-ray was done there was no obvious fracture.  The patient was put in a sling.  He was advised to takes Percocet and a prescription was written but he refused to take that strong of a pain medicine  He is taking Tylenol.  He says when he first fell off the couch she could move the elbow but over time as the pain and swelling increased now he has very limited range of motion.  Patient has a history of NSAID induced ulcer which caused severe bleeding and he says almost killed him.  He is very reluctant to take NSAIDs at this time.  He has over the years taken some prednisone with low doses and tolerated that well  He denies fever chest pain says he is chronically short of breath he has some pain radiating in his right arm but no evidence of neurovascular compromise  I reviewed his medical history.  Meds ordered this encounter  Medications   HYDROcodone-acetaminophen (NORCO) 7.5-325 MG tablet    Sig: Take 1 tablet by mouth every 6 (six) hours as needed for up to 5 days for moderate pain.    Dispense:  20 tablet    Refill:  0     BP (!) 138/92 Comment: on 04/23/22  Ht 5\' 6"  (1.676 m)   Wt (!) 406 lb (184.2 kg)   BMI 65.53 kg/m   He has various deformities of his fingers and hands he says his ankles do not move well  He is awake and alert he is oriented x 3 he is in a pleasant mood  His right elbow is tender swollen and warm  His range of motion is 120-75 degrees he is tender over the lateral portion including the radial head.  He has a very limited range of motion in his right wrist compared to the left  Normal sensation to palpation pressure and soft touch  Images 3 views of the right elbow I do not see a fracture no dislocation.  There is an elbow effusion.  His report from  radiology definitely sees the effusion as well anterior fat pad elevation  Recommend continue sling for support.  Remove the sling 3 times a day for active range of motion within pain tolerance  Take Norco for pain every 6 hours as needed  Return in 1 week for recheck range of motion and possible second set of x-rays

## 2022-04-26 NOTE — Telephone Encounter (Signed)
Now he states he will just use [facility] and have them deliver Does not need it resent.

## 2022-05-04 ENCOUNTER — Ambulatory Visit: Payer: Medicaid Other | Admitting: Orthopedic Surgery

## 2022-06-15 ENCOUNTER — Encounter: Payer: Self-pay | Admitting: Radiology

## 2022-06-26 ENCOUNTER — Observation Stay (HOSPITAL_COMMUNITY)
Admission: EM | Admit: 2022-06-26 | Discharge: 2022-07-01 | Disposition: A | Discharge status: Skilled Nursing Facility | Payer: Medicaid Other | Attending: Family Medicine | Admitting: Family Medicine

## 2022-06-26 ENCOUNTER — Emergency Department (HOSPITAL_COMMUNITY): Payer: Medicaid Other

## 2022-06-26 DIAGNOSIS — M19071 Primary osteoarthritis, right ankle and foot: Secondary | ICD-10-CM | POA: Diagnosis not present

## 2022-06-26 DIAGNOSIS — F17210 Nicotine dependence, cigarettes, uncomplicated: Secondary | ICD-10-CM | POA: Diagnosis not present

## 2022-06-26 DIAGNOSIS — Z6841 Body Mass Index (BMI) 40.0 and over, adult: Secondary | ICD-10-CM | POA: Diagnosis present

## 2022-06-26 DIAGNOSIS — Z79899 Other long term (current) drug therapy: Secondary | ICD-10-CM | POA: Diagnosis not present

## 2022-06-26 DIAGNOSIS — M25552 Pain in left hip: Principal | ICD-10-CM | POA: Diagnosis present

## 2022-06-26 DIAGNOSIS — M19072 Primary osteoarthritis, left ankle and foot: Secondary | ICD-10-CM | POA: Diagnosis not present

## 2022-06-26 DIAGNOSIS — D72829 Elevated white blood cell count, unspecified: Secondary | ICD-10-CM | POA: Insufficient documentation

## 2022-06-26 DIAGNOSIS — M10079 Idiopathic gout, unspecified ankle and foot: Secondary | ICD-10-CM

## 2022-06-26 DIAGNOSIS — W19XXXA Unspecified fall, initial encounter: Secondary | ICD-10-CM

## 2022-06-26 DIAGNOSIS — Y92009 Unspecified place in unspecified non-institutional (private) residence as the place of occurrence of the external cause: Secondary | ICD-10-CM | POA: Diagnosis not present

## 2022-06-26 DIAGNOSIS — W109XXA Fall (on) (from) unspecified stairs and steps, initial encounter: Secondary | ICD-10-CM | POA: Diagnosis not present

## 2022-06-26 DIAGNOSIS — K279 Peptic ulcer, site unspecified, unspecified as acute or chronic, without hemorrhage or perforation: Secondary | ICD-10-CM | POA: Insufficient documentation

## 2022-06-26 DIAGNOSIS — M109 Gout, unspecified: Secondary | ICD-10-CM | POA: Diagnosis present

## 2022-06-26 LAB — CBC WITH DIFFERENTIAL/PLATELET
Abs Immature Granulocytes: 0.05 10*3/uL (ref 0.00–0.07)
Basophils Absolute: 0 10*3/uL (ref 0.0–0.1)
Basophils Relative: 0 %
Eosinophils Absolute: 0 10*3/uL (ref 0.0–0.5)
Eosinophils Relative: 0 %
HCT: 41.6 % (ref 39.0–52.0)
Hemoglobin: 13.6 g/dL (ref 13.0–17.0)
Immature Granulocytes: 0 %
Lymphocytes Relative: 13 %
Lymphs Abs: 2.1 10*3/uL (ref 0.7–4.0)
MCH: 29 pg (ref 26.0–34.0)
MCHC: 32.7 g/dL (ref 30.0–36.0)
MCV: 88.7 fL (ref 80.0–100.0)
Monocytes Absolute: 1.4 10*3/uL — ABNORMAL HIGH (ref 0.1–1.0)
Monocytes Relative: 9 %
Neutro Abs: 12 10*3/uL — ABNORMAL HIGH (ref 1.7–7.7)
Neutrophils Relative %: 78 %
Platelets: 328 10*3/uL (ref 150–400)
RBC: 4.69 MIL/uL (ref 4.22–5.81)
RDW: 13.4 % (ref 11.5–15.5)
WBC: 15.6 10*3/uL — ABNORMAL HIGH (ref 4.0–10.5)
nRBC: 0 % (ref 0.0–0.2)

## 2022-06-26 LAB — URINALYSIS, ROUTINE W REFLEX MICROSCOPIC
Bacteria, UA: NONE SEEN
Bilirubin Urine: NEGATIVE
Glucose, UA: NEGATIVE mg/dL
Hgb urine dipstick: NEGATIVE
Ketones, ur: 80 mg/dL — AB
Leukocytes,Ua: NEGATIVE
Nitrite: NEGATIVE
Protein, ur: 30 mg/dL — AB
Specific Gravity, Urine: 1.024 (ref 1.005–1.030)
pH: 5 (ref 5.0–8.0)

## 2022-06-26 LAB — BASIC METABOLIC PANEL
Anion gap: 15 (ref 5–15)
BUN: 12 mg/dL (ref 6–20)
CO2: 24 mmol/L (ref 22–32)
Calcium: 9 mg/dL (ref 8.9–10.3)
Chloride: 99 mmol/L (ref 98–111)
Creatinine, Ser: 0.69 mg/dL (ref 0.61–1.24)
GFR, Estimated: >60 mL/min (ref >60)
Glucose, Bld: 96 mg/dL (ref 70–99)
Potassium: 3.9 mmol/L (ref 3.5–5.1)
Sodium: 138 mmol/L (ref 135–145)

## 2022-06-26 LAB — URIC ACID: Uric Acid, Serum: 11.3 mg/dL — ABNORMAL HIGH (ref 3.7–8.6)

## 2022-06-26 MED ORDER — PANTOPRAZOLE SODIUM 40 MG PO TBEC
40.0000 mg | DELAYED_RELEASE_TABLET | Freq: Every day | ORAL | Status: DC
  Administered 2022-06-26 – 2022-06-28 (×3): 40 mg via ORAL
  Filled 2022-06-26 (×3): qty 1

## 2022-06-26 MED ORDER — SODIUM CHLORIDE 0.9 % IV BOLUS
1000.0000 mL | Freq: Once | INTRAVENOUS | Status: AC
  Administered 2022-06-26: 1000 mL via INTRAVENOUS

## 2022-06-26 MED ORDER — ACETAMINOPHEN 325 MG PO TABS: 650.0000 mg | ORAL_TABLET | Freq: Four times a day (QID) | ORAL | Status: DC | PRN

## 2022-06-26 MED ORDER — OXYCODONE-ACETAMINOPHEN 5-325 MG PO TABS
2.0000 | ORAL_TABLET | Freq: Once | ORAL | Status: AC
  Administered 2022-06-26: 2 via ORAL
  Filled 2022-06-26: qty 2

## 2022-06-26 MED ORDER — MORPHINE SULFATE (PF) 4 MG/ML IV SOLN
4.0000 mg | Freq: Once | INTRAVENOUS | Status: AC
  Administered 2022-06-26: 4 mg via INTRAVENOUS
  Filled 2022-06-26: qty 1

## 2022-06-26 MED ORDER — COLCHICINE 0.6 MG PO TABS
1.2000 mg | ORAL_TABLET | Freq: Once | ORAL | Status: AC
  Administered 2022-06-26: 1.2 mg via ORAL
  Filled 2022-06-26: qty 2

## 2022-06-26 MED ORDER — ACETAMINOPHEN 650 MG RE SUPP: 650.0000 mg | Freq: Four times a day (QID) | RECTAL | Status: DC | PRN

## 2022-06-26 MED ORDER — ONDANSETRON HCL 4 MG/2ML IJ SOLN: 4.0000 mg | Freq: Four times a day (QID) | INTRAMUSCULAR | Status: DC | PRN

## 2022-06-26 MED ORDER — ONDANSETRON HCL 4 MG PO TABS: 4.0000 mg | ORAL_TABLET | Freq: Four times a day (QID) | ORAL | Status: DC | PRN

## 2022-06-26 MED ORDER — OXYCODONE-ACETAMINOPHEN 5-325 MG PO TABS
1.0000 | ORAL_TABLET | ORAL | Status: DC | PRN
  Administered 2022-06-26 – 2022-07-01 (×26): 1 via ORAL
  Filled 2022-06-26 (×26): qty 1

## 2022-06-26 MED ORDER — COLCHICINE 0.6 MG PO TABS
0.6000 mg | ORAL_TABLET | Freq: Once | ORAL | Status: AC
  Administered 2022-06-26: 0.6 mg via ORAL
  Filled 2022-06-26: qty 1

## 2022-06-26 MED ORDER — ONDANSETRON HCL 4 MG/2ML IJ SOLN
4.0000 mg | Freq: Once | INTRAMUSCULAR | Status: AC
  Administered 2022-06-26: 4 mg via INTRAVENOUS
  Filled 2022-06-26: qty 2

## 2022-06-26 MED ORDER — ENOXAPARIN SODIUM 80 MG/0.8ML IJ SOSY
80.0000 mg | PREFILLED_SYRINGE | INTRAMUSCULAR | Status: DC
  Administered 2022-06-26 – 2022-06-30 (×5): 80 mg via SUBCUTANEOUS
  Filled 2022-06-26 (×5): qty 0.8

## 2022-06-26 NOTE — ED Triage Notes (Signed)
Pt brought in by [facility] for c/o fall from being dizzy and missing a step; pt c/o left hip and ankle pain; no deformity noted

## 2022-06-26 NOTE — H&P (Signed)
History and Physical    Patient: [name] [id number] DOB: Aug 17, 1985 DOA: 06/26/2022 DOS: the patient was seen and examined on 06/27/2022 PCP: Neale Burly, MD  Patient coming from: Home  Chief Complaint:  Chief Complaint  Patient presents with   Fall   HPI: [name] is a 37 y.o. male with medical history significant of gout, peptic ulcer disease, osteoarthritis who presents to the emergency department via EMS from home due to 5-day onset of increased left hip pain which worsened today after sustaining a fall.  Patient states that he was walking down his front porch steps when he missed a step and sustained a fall landing on his right shoulder.  Patient states that he has not been eating or drinking much especially in the last 2 days due to the left hip pain which started about 5 days ago, so he felt some dizziness while walking down the steps, he felt that he was dehydrated.  Patient also complained of left ankle pain which worsens with sheets touching his left foot.  Patient does not feel that he can take care of himself at home due to difficulty in being able to bear weight on his legs because of the pain, so he activated EMS and was sent to the ED for further evaluation and management.    ED Course: In the emergency department, he was hemodynamically stable with normal vital signs, workup in the ED showed normal CBC except for WBC of 15.6, normal BMP, urinalysis was unimpressive for UTI, uric acid was elevated at 11.3 (chronically elevated). Right ankle x-ray showed no definite acute fracture. 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 Irregular appearance of the talar dome, possible avascular necrosis. Suspect ankylosis across the talocalcaneal joint. Left ankle x-ray showed no acute fracture.  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 Worsened severe navicular-cuneiform and talonavicular osteoarthritis. Right shoulder  x-ray showed mild acromioclavicular osteoarthritis Left hip x-ray showed no acute fracture.  Mild bilateral sacroiliac subchondral sclerosis, unchanged She was treated with colchicine, morphine, Percocet, IV hydration was provided.  Hospitalist was asked to admit patient for further evaluation and management.   Review of Systems: Review of systems as noted in the HPI. All other systems reviewed and are negative.   Past Medical History:  Diagnosis Date   Acute blood loss anemia 09/02/2012   S/p 1 unit rbcs   Cellulitis    Erosive esophagitis 09/01/2012   NSAID induced.   Gall stones    Gastric ulcer with hemorrhage 09/02/2012   s/p bleeding control tx. Per Dr. Gala Romney   Gout    Obesity    UTI (lower urinary tract infection)    Past Surgical History:  Procedure Laterality Date   COLONOSCOPY WITH PROPOFOL N/A 07/30/2014   RMR: Colonoscopy anal canal/internal hemorrhoids, otherwise normal ileocolonoscoopy.    ESOPHAGOGASTRODUODENOSCOPY Left 09/01/2012   [id number] esophageal erosions consistent with erosive reflux esophagitis/Pre-pyloric benign-appearing gastric ulcer with bleeding stigmata status post bleeding control therapy as described above   ESOPHAGOGASTRODUODENOSCOPY N/A 12/13/2012   [id number] hernia. PReviously noted gastric ulcer completely healed.    ESOPHAGOGASTRODUODENOSCOPY (EGD) WITH PROPOFOL N/A 07/30/2014   Procedure: ESOPHAGOGASTRODUODENOSCOPY (EGD) WITH PROPOFOL;  Surgeon: Daneil Dolin, MD;  Location: [facility];  Service: Endoscopy;  Laterality: N/A;   TONSILLECTOMY      Social History:  reports that he has been smoking cigarettes. He has a 7.00 pack-year smoking history. His smokeless tobacco use includes snuff. He reports current  drug use. Drug: Marijuana. He reports that he does not drink alcohol.   Allergies  Allergen Reactions   Asa [Aspirin] Other (See Comments)    Pt doesn't know reaction.   Clindamycin/Lincomycin     Gastric ulcer     Nsaids Other (See  Comments)    Causes Ulcers    Penicillins Other (See Comments)    Pt doesn't know reaction. Has patient had a PCN reaction causing immediate rash, facial/tongue/throat swelling, SOB or lightheadedness with hypotension: No Has patient had a PCN reaction causing severe rash involving mucus membranes or skin necrosis: No Has patient had a PCN reaction that required hospitalization No Has patient had a PCN reaction occurring within the last 10 years: No If all of the above answers are "NO", then may proceed with Cephalosporin use.    Sulfa Antibiotics Other (See Comments)    Pt doesn't know reaction.    Family History  Problem Relation Age of Onset   Heart failure Mother    Colon cancer Neg Hx      Prior to Admission medications   Medication Sig Start Date End Date Taking? Authorizing Provider  colchicine 0.6 MG tablet 1.2 mg PO at first sign of flare, then 0.6 mg 1 hr later; not to exceed 1.8 mg in 1-hr period Patient not taking: Reported on 04/26/2022 11/02/15   Jola Schmidt, MD  gabapentin (NEURONTIN) 300 MG capsule Take 300 mg by mouth 3 (three) times daily as needed (pain).     [provider]    Physical Exam: BP 126/74   Pulse 85   Temp 97.7 F (36.5 C) (Oral)   Resp 18   Ht '5\' 7"'$  (1.702 m)   Wt (!) 174.3 kg   SpO2 96%   BMI 60.18 kg/m   General: 37 y.o. year-old male well developed well nourished in no acute distress.  Alert and oriented x3. HEENT: NCAT, EOMI Neck: Supple, trachea medial Cardiovascular: Regular rate and rhythm with no rubs or gallops.  No thyromegaly or JVD noted.  No lower extremity edema. 2/4 pulses in all 4 extremities. Respiratory: Clear to auscultation with no wheezes or rales. Good inspiratory effort. Abdomen: Soft, nontender nondistended with normal bowel sounds x4 quadrants. Muskuloskeletal: No cyanosis, clubbing or edema noted bilaterally Neuro: CN II-XII intact, strength 5/5 x 4, sensation, reflexes intact Skin: No ulcerative  lesions noted or rashes Psychiatry: Judgement and insight appear normal. Mood is appropriate for condition and setting          Labs on Admission:  Basic Metabolic Panel: Recent Labs  Lab 06/26/22 1549  NA 138  K 3.9  CL 99  CO2 24  GLUCOSE 96  BUN 12  CREATININE 0.69  CALCIUM 9.0   Liver Function Tests: No results for input(s): "AST", "ALT", "ALKPHOS", "BILITOT", "PROT", "ALBUMIN" in the last 168 hours. No results for input(s): "LIPASE", "AMYLASE" in the last 168 hours. No results for input(s): "AMMONIA" in the last 168 hours. CBC: Recent Labs  Lab 06/26/22 1549  WBC 15.6*  NEUTROABS 12.0*  HGB 13.6  HCT 41.6  MCV 88.7  PLT 328   Cardiac Enzymes: No results for input(s): "CKTOTAL", "CKMB", "CKMBINDEX", "TROPONINI" in the last 168 hours.  BNP (last 3 results) No results for input(s): "BNP" in the last 8760 hours.  ProBNP (last 3 results) No results for input(s): "PROBNP" in the last 8760 hours.  CBG: No results for input(s): "GLUCAP" in the last 168 hours.  Radiological Exams on Admission: DG Ankle  Complete Right  Result Date: 06/26/2022 CLINICAL DATA:  Fall EXAM: RIGHT ANKLE - COMPLETE 3+ VIEW COMPARISON:  05/07/2015 FINDINGS: No definite acute fracture is seen. No dislocation. 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 Irregular appearance of the talar dome, possible avascular necrosis. Suspect ankylosis across the talocalcaneal joint. Progression of degenerative changes at the medial and lateral ankle joint. Diffuse soft tissue swelling IMPRESSION: 1. No definite acute fracture is seen. 2. 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 Irregular appearance of the talar dome, possible avascular necrosis. Suspect ankylosis across the talocalcaneal joint. Electronically Signed   By: Donavan Foil M.D.   On: 06/26/2022 18:25   DG Shoulder Right  Result Date: 06/26/2022 CLINICAL DATA:  Pain with fall. EXAM: RIGHT SHOULDER - 2+ VIEW  COMPARISON:  None Available. FINDINGS: Mild acromioclavicular joint space narrowing and peripheral osteophytosis. No significant glenohumeral osteoarthritis. No acute fracture is seen. No dislocation. The visualized portion of the right lung is unremarkable. IMPRESSION: Mild acromioclavicular osteoarthritis. Electronically Signed   By: Yvonne Kendall M.D.   On: 06/26/2022 17:08   DG Hip Unilat W or Wo Pelvis 2-3 Views Left  Result Date: 06/26/2022 CLINICAL DATA:  Left hip pain after fall. EXAM: DG HIP (WITH OR WITHOUT PELVIS) 2-3V LEFT COMPARISON:  Pelvis and left hip radiographs 08/04/2015; CT abdomen and pelvis 07/29/2014 FINDINGS: The bilateral sacroiliac common bilateral femoroacetabular, and pubic symphysis joint spaces appear maintained. Mild bilateral sacroiliac subchondral sclerosis is unchanged. No acute fracture is seen. No dislocation. IMPRESSION: 1. No acute fracture. 2. Mild bilateral sacroiliac subchondral sclerosis, unchanged. Electronically Signed   By: Yvonne Kendall M.D.   On: 06/26/2022 17:07   DG Ankle Complete Left  Result Date: 06/26/2022 CLINICAL DATA:  Fall.  Ankle pain. EXAM: LEFT ANKLE COMPLETE - 3+ VIEW COMPARISON:  Left foot radiographs 06/07/2017 FINDINGS: There is again complete ankylosis of all the tarsometatarsal joints. There is worsened severe navicular-cuneiform and talonavicular severe joint space narrowing. Large dorsal talonavicular degenerative spurring/beaking is similar to prior. A round sclerotic focus within the base of the first metatarsal is unchanged and may represent a bone island. Moderate distal anterior tibial plafond degenerative spurring is similar to prior. No acute fracture is seen.  No dislocation. IMPRESSION: 1. No acute fracture. 2. 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 3. Worsened severe navicular-cuneiform and talonavicular osteoarthritis. Electronically Signed   By: Yvonne Kendall M.D.   On: 06/26/2022 17:04    EKG: I  independently viewed the EKG done and my findings are as followed: Normal sinus rhythm  Assessment/Plan Present on Admission:  Left hip pain  Morbid obesity ([place])  Gout  Principal Problem:   Left hip pain Active Problems:   Morbid obesity ([place])   Gout   Fall at home, initial encounter   Leukocytosis   Peptic ulcer disease   Left hip pain and bilateral ankle pain s/p fall at home Several imaging studies including right ankle, left ankle, left hip and right shoulder showed no acute fracture Continue Percocet 5-325 mg every 4 hours as needed Continue fall precaution Continue PT/OT eval and treat  Leukocytosis possibly secondary to stress margination WBC 15.6, no sign of acute infectious process at this time Continue to monitor WBC with morning labs  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 Observation status is judged to be reasonable and necessary in order to provide the required intensity of service to ensure the patient's safety. The patient's presenting symptoms, physical exam findings, and initial radiographic and laboratory data in the context of their medical condition is felt to place them at decreased risk for further clinical deterioration. Furthermore, it is anticipated that the patient will be medically stable for discharge from the hospital within 2 midnights of admission.   Author: Bernadette Hoit, DO 06/27/2022 12:35  AM  For on call review [email or web address].

## 2022-06-26 NOTE — ED Provider Notes (Signed)
[facility] Provider Note   CSN: [id number] Arrival date & time: 06/26/22  1428     History  Chief Complaint  Patient presents with   Khaled Whatcott is a 37 y.o. male who with a history including PUD, gout, osteoarthritis who endorses increased pain in his left hip region which has been present for about 5 days, fell prior to arrival when walking down his front porch steps landing on his right shoulder and possibly rolling his ankle during this fall.  He endorses having lightheadedness prior to the fall, he denies LOC, denies head injury or headache, palpitations, chest pain or shortness of breath.  He has had an increase in his chronic left hip pain for the past 5 days and states when he is in a lot of pain he has a poor appetite and states he has had little to eat or drink over the past 2 days.  He endorses reduced urinary frequency and darker than normal urine which makes him suspicious that he may be dehydrated as the source of his lightheadedness today.  He has had no treatment for his symptoms prior to arrival.  The history is provided by the patient.       Home Medications Prior to Admission medications   Medication Sig Start Date End Date Taking? Authorizing Provider  colchicine 0.6 MG tablet 1.2 mg PO at first sign of flare, then 0.6 mg 1 hr later; not to exceed 1.8 mg in 1-hr period Patient not taking: Reported on 04/26/2022 11/02/15   Jola Schmidt, MD  gabapentin (NEURONTIN) 300 MG capsule Take 300 mg by mouth 3 (three) times daily as needed (pain).     [provider]      Allergies    Asa [aspirin], Clindamycin/lincomycin, Nsaids, Penicillins, and Sulfa antibiotics    Review of Systems   Review of Systems  Constitutional:  Positive for appetite change. Negative for fever.  HENT:  Negative for congestion and sore throat.   Eyes: Negative.   Respiratory:  Negative for chest tightness and shortness of  breath.   Cardiovascular:  Negative for chest pain and palpitations.  Gastrointestinal:  Negative for abdominal pain and nausea.  Genitourinary:  Positive for decreased urine volume.  Musculoskeletal:  Positive for arthralgias. Negative for joint swelling and neck pain.  Skin: Negative.  Negative for rash and wound.  Neurological:  Positive for light-headedness. Negative for dizziness, weakness, numbness and headaches.  Psychiatric/Behavioral: Negative.    All other systems reviewed and are negative.   Physical Exam Updated Vital Signs BP 128/74   Pulse 97   Temp 97.8 F (36.6 C) (Oral)   Resp 11   Ht '5\' 7"'$  (1.702 m)   Wt (!) 176.9 kg   SpO2 96%   BMI 61.08 kg/m  Physical Exam Vitals and nursing note reviewed.  Constitutional:      Appearance: He is well-developed.  HENT:     Head: Normocephalic and atraumatic.  Eyes:     Conjunctiva/sclera: Conjunctivae normal.  Cardiovascular:     Rate and Rhythm: Normal rate and regular rhythm.     Heart sounds: Normal heart sounds.  Pulmonary:     Effort: Pulmonary effort is normal.     Breath sounds: Normal breath sounds. No wheezing.  Abdominal:     General: Bowel sounds are normal.     Palpations: Abdomen is soft.     Tenderness: There is no abdominal tenderness.  Musculoskeletal:        General: Tenderness present. Normal range of motion.     Right shoulder: Bony tenderness present. No swelling or deformity. Normal pulse.     Cervical back: Normal range of motion. No rigidity or tenderness.     Left hip: No deformity. Normal range of motion.     Right ankle: Swelling present. Tenderness present over the medial malleolus. No proximal fibula tenderness. Normal pulse.     Comments: Tender along superior right shoulder joint, no palpable deformity.  TTp bilateral ankles, achilles tendon and foot arch  Skin:    General: Skin is warm and dry.  Neurological:     Mental Status: He is alert.     ED Results / Procedures /  Treatments   Labs (all labs ordered are listed, but only abnormal results are displayed) Labs Reviewed  CBC WITH DIFFERENTIAL/PLATELET - Abnormal; Notable for the following components:      Result Value   WBC 15.6 (*)    Neutro Abs 12.0 (*)    Monocytes Absolute 1.4 (*)    All other components within normal limits  URINALYSIS, ROUTINE W REFLEX MICROSCOPIC - Abnormal; Notable for the following components:   Ketones, ur 80 (*)    Protein, ur 30 (*)    All other components within normal limits  BASIC METABOLIC PANEL  URIC ACID    EKG EKG Interpretation  Date/Time:  Monday June 26 2022 16:12:02 EDT Ventricular Rate:  99 PR Interval:    QRS Duration: 99 QT Interval:  355 QTC Calculation: 456 R Axis:   85 Text Interpretation: Normal sinus rhythm no acute ST/Ts No significant change since prior 11/15 Confirmed by Aletta Edouard [phone]) on 06/26/2022 4:19:15 PM  Radiology DG Ankle Complete Right  Result Date: 06/26/2022 CLINICAL DATA:  Fall EXAM: RIGHT ANKLE - COMPLETE 3+ VIEW COMPARISON:  05/07/2015 FINDINGS: No definite acute fracture is seen. No dislocation. 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 Irregular appearance of the talar dome, possible avascular necrosis. Suspect ankylosis across the talocalcaneal joint. Progression of degenerative changes at the medial and lateral ankle joint. Diffuse soft tissue swelling IMPRESSION: 1. No definite acute fracture is seen. 2. 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 Irregular appearance of the talar dome, possible avascular necrosis. Suspect ankylosis across the talocalcaneal joint. Electronically Signed   By: Donavan Foil M.D.   On: 06/26/2022 18:25   DG Shoulder Right  Result Date: 06/26/2022 CLINICAL DATA:  Pain with fall. EXAM: RIGHT SHOULDER - 2+ VIEW COMPARISON:  None Available. FINDINGS: Mild acromioclavicular joint space narrowing and peripheral osteophytosis. No significant glenohumeral  osteoarthritis. No acute fracture is seen. No dislocation. The visualized portion of the right lung is unremarkable. IMPRESSION: Mild acromioclavicular osteoarthritis. Electronically Signed   By: Yvonne Kendall M.D.   On: 06/26/2022 17:08   DG Hip Unilat W or Wo Pelvis 2-3 Views Left  Result Date: 06/26/2022 CLINICAL DATA:  Left hip pain after fall. EXAM: DG HIP (WITH OR WITHOUT PELVIS) 2-3V LEFT COMPARISON:  Pelvis and left hip radiographs 08/04/2015; CT abdomen and pelvis 07/29/2014 FINDINGS: The bilateral sacroiliac common bilateral femoroacetabular, and pubic symphysis joint spaces appear maintained. Mild bilateral sacroiliac subchondral sclerosis is unchanged. No acute fracture is seen. No dislocation. IMPRESSION: 1. No acute fracture. 2. Mild bilateral sacroiliac subchondral sclerosis, unchanged. Electronically Signed   By: Yvonne Kendall M.D.   On: 06/26/2022 17:07   DG Ankle Complete Left  Result  Date: 06/26/2022 CLINICAL DATA:  Fall.  Ankle pain. EXAM: LEFT ANKLE COMPLETE - 3+ VIEW COMPARISON:  Left foot radiographs 06/07/2017 FINDINGS: There is again complete ankylosis of all the tarsometatarsal joints. There is worsened severe navicular-cuneiform and talonavicular severe joint space narrowing. Large dorsal talonavicular degenerative spurring/beaking is similar to prior. A round sclerotic focus within the base of the first metatarsal is unchanged and may represent a bone island. Moderate distal anterior tibial plafond degenerative spurring is similar to prior. No acute fracture is seen.  No dislocation. IMPRESSION: 1. No acute fracture. 2. 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 3. Worsened severe navicular-cuneiform and talonavicular osteoarthritis. Electronically Signed   By: Yvonne Kendall M.D.   On: 06/26/2022 17:04    Procedures Procedures    Medications Ordered in ED Medications  colchicine tablet 0.6 mg (has no administration in time range)  sodium chloride 0.9 %  bolus 1,000 mL (0 mLs Intravenous Stopped 06/26/22 1859)  morphine (PF) 4 MG/ML injection 4 mg (4 mg Intravenous Given 06/26/22 1714)  ondansetron (ZOFRAN) injection 4 mg (4 mg Intravenous Given 06/26/22 1714)  oxyCODONE-acetaminophen (PERCOCET/ROXICET) 5-325 MG per tablet 2 tablet (2 tablets Oral Given 06/26/22 1912)  colchicine tablet 1.2 mg (1.2 mg Oral Given 06/26/22 1912)  sodium chloride 0.9 % bolus 1,000 mL (1,000 mLs Intravenous New Bag/Given 06/26/22 1912)    ED Course/ Medical Decision Making/ A&P                             Medical Decision Making Patient presenting with various complaints of orthopedic pain, chronic left hip pain for the last 5 days, left heel and mid step pain, now right foot pain after a trip and fall prior to arrival.  At reevaluation he adds that his symptoms in his hip and left ankle remind him of prior gouty flares.  He states that even the sheets touching his left foot increases his pain.  He cannot take NSAIDs or prednisone secondary to history of PUD, morphine given here was transiently helpful.  He reports severe bilateral foot pain and left hip pain, stating last time he actually had to be placed in rehab.  He has 2 flights of steps in order to get into his home, he does not feel he can weight-bear at this time or care for himself independently.    He is getting colchicine and oxycodone.  We will attempt weightbearing with a walker, if he does not tolerate this, he may need to be admitted for pain control and consider PT and ortho eval given severe osteoarthritis.  Left ankle films also raises concern for possible AVN of the right ankle.   Pt unable to weight bear.    Amount and/or Complexity of Data Reviewed Labs: ordered.    Details: Labs are significant for WBC count of 15.6, leukocytosis of unclear etiology, patient is afebrile.  He also has significant ketonuria although his BUN and creatinine are normal, with a BUN of 12 and a creatinine of 0.69.  Uric  acid is currently pending. Radiology: ordered.    Details: Radiology results per above.  The avascular necrosis suggestion involves the right talar dome. Discussion of management or test interpretation with external provider(s): Call placed to hospitalist for admission for pain control and further treatment of his suspected gout vs exacerbation of osteoarthritis pain.  Discussed with Dr. Josephine Cables who will see pt for admission.  Risk Decision regarding hospitalization.  Final Clinical Impression(s) / ED Diagnoses Final diagnoses:  Pain of left hip  Primary osteoarthritis of both ankles    Rx / DC Orders ED Discharge Orders     None         Landis Martins 06/26/22 1959    Hayden Rasmussen, MD 06/27/22 1038

## 2022-06-27 DIAGNOSIS — D72829 Elevated white blood cell count, unspecified: Secondary | ICD-10-CM | POA: Insufficient documentation

## 2022-06-27 DIAGNOSIS — M25552 Pain in left hip: Secondary | ICD-10-CM | POA: Diagnosis not present

## 2022-06-27 DIAGNOSIS — W19XXXA Unspecified fall, initial encounter: Secondary | ICD-10-CM

## 2022-06-27 DIAGNOSIS — K279 Peptic ulcer, site unspecified, unspecified as acute or chronic, without hemorrhage or perforation: Secondary | ICD-10-CM | POA: Insufficient documentation

## 2022-06-27 DIAGNOSIS — Y92009 Unspecified place in unspecified non-institutional (private) residence as the place of occurrence of the external cause: Secondary | ICD-10-CM

## 2022-06-27 LAB — COMPREHENSIVE METABOLIC PANEL
ALT: 19 U/L (ref 0–44)
AST: 13 U/L — ABNORMAL LOW (ref 15–41)
Albumin: 2.7 g/dL — ABNORMAL LOW (ref 3.5–5.0)
Alkaline Phosphatase: 68 U/L (ref 38–126)
Anion gap: 10 (ref 5–15)
BUN: 10 mg/dL (ref 6–20)
CO2: 28 mmol/L (ref 22–32)
Calcium: 8.5 mg/dL — ABNORMAL LOW (ref 8.9–10.3)
Chloride: 99 mmol/L (ref 98–111)
Creatinine, Ser: 0.72 mg/dL (ref 0.61–1.24)
GFR, Estimated: >60 mL/min (ref >60)
Glucose, Bld: 110 mg/dL — ABNORMAL HIGH (ref 70–99)
Potassium: 3.8 mmol/L (ref 3.5–5.1)
Sodium: 137 mmol/L (ref 135–145)
Total Bilirubin: 1 mg/dL (ref 0.3–1.2)
Total Protein: 7.1 g/dL (ref 6.5–8.1)

## 2022-06-27 LAB — CBC
HCT: 38.4 % — ABNORMAL LOW (ref 39.0–52.0)
Hemoglobin: 12.3 g/dL — ABNORMAL LOW (ref 13.0–17.0)
MCH: 29.3 pg (ref 26.0–34.0)
MCHC: 32 g/dL (ref 30.0–36.0)
MCV: 91.4 fL (ref 80.0–100.0)
Platelets: 282 10*3/uL (ref 150–400)
RBC: 4.2 MIL/uL — ABNORMAL LOW (ref 4.22–5.81)
RDW: 13.4 % (ref 11.5–15.5)
WBC: 11.1 10*3/uL — ABNORMAL HIGH (ref 4.0–10.5)
nRBC: 0 % (ref 0.0–0.2)

## 2022-06-27 LAB — PHOSPHORUS: Phosphorus: 4.1 mg/dL (ref 2.5–4.6)

## 2022-06-27 LAB — HIV ANTIBODY (ROUTINE TESTING W REFLEX): HIV Screen 4th Generation wRfx: NONREACTIVE

## 2022-06-27 LAB — MAGNESIUM: Magnesium: 1.9 mg/dL (ref 1.7–2.4)

## 2022-06-27 MED ORDER — GABAPENTIN 300 MG PO CAPS
300.0000 mg | ORAL_CAPSULE | Freq: Three times a day (TID) | ORAL | Status: DC
  Administered 2022-06-27 – 2022-06-28 (×3): 300 mg via ORAL
  Filled 2022-06-27 (×3): qty 1

## 2022-06-27 MED ORDER — COLCHICINE 0.6 MG PO TABS
0.6000 mg | ORAL_TABLET | Freq: Every day | ORAL | Status: DC
  Administered 2022-06-27 – 2022-06-28 (×2): 0.6 mg via ORAL
  Filled 2022-06-27 (×2): qty 1

## 2022-06-27 MED ORDER — NYSTATIN 100000 UNIT/GM EX POWD
Freq: Two times a day (BID) | CUTANEOUS | Status: DC
  Filled 2022-06-27: qty 15

## 2022-06-27 MED ORDER — NYSTATIN 100000 UNIT/GM EX POWD: Freq: Two times a day (BID) | CUTANEOUS | Status: DC

## 2022-06-27 MED ORDER — ALLOPURINOL 100 MG PO TABS
100.0000 mg | ORAL_TABLET | Freq: Every day | ORAL | Status: DC
  Administered 2022-06-29 – 2022-07-01 (×3): 100 mg via ORAL
  Filled 2022-06-27 (×5): qty 1

## 2022-06-27 NOTE — Plan of Care (Signed)
  Problem: Acute Rehab OT Goals (only OT should resolve) Goal: Pt. Will Perform Grooming Flowsheets (Taken 06/27/2022 1135) Pt Will Perform Grooming:  with modified independence  sitting Goal: Pt. Will Perform Lower Body Bathing Flowsheets (Taken 06/27/2022 1135) Pt Will Perform Lower Body Bathing:  with modified independence  sitting/lateral leans Goal: Pt. Will Perform Lower Body Dressing Flowsheets (Taken 06/27/2022 1135) Pt Will Perform Lower Body Dressing:  with modified independence  sitting/lateral leans Goal: Pt. Will Transfer To Toilet Flowsheets (Taken 06/27/2022 1135) Pt Will Transfer to Toilet:  with min assist  stand pivot transfer Goal: Pt. Will Perform Toileting-Clothing Manipulation Flowsheets (Taken 06/27/2022 1135) Pt Will Perform Toileting - Clothing Manipulation and hygiene:  with modified independence  sitting/lateral leans  Theodoro Koval OT, MOT

## 2022-06-27 NOTE — TOC Initial Note (Signed)
Transition of Care [facility]) - Initial/Assessment Note    Patient Details  Name: [name] MRN: [id number] Date of Birth: 10-22-1985  Transition of Care [facility]) CM/SW Contact:    Salome Arnt, [place] Phone Number: 06/27/2022, 11:37 AM  Clinical Narrative:  Pt admitted with left hip pain after fall at home. Pt reports he lives with friends. He is independent with ADLs at baseline. However, recently he said he has been having a lot of hip pain at home which is limiting him. PT evaluated pt and recommend SNF. Discussed with pt who reports years ago he went to SNF. He is aware he will have to turn check over to facility and stay at least 30 days. Pt understands may have to look outside of area. Will initiate bed search. MD updated.                  Expected Discharge Plan: Skilled Nursing Facility Barriers to Discharge: Continued Medical Work up   Patient Goals and CMS Choice Patient states their goals for this hospitalization and ongoing recovery are:: SNF   Choice offered to / list presented to : Patient      Expected Discharge Plan and Services In-house Referral: Clinical Social Work   Post Acute Care Choice: [place] Living arrangements for the past 2 months: [place]                                      Prior Living Arrangements/Services Living arrangements for the past 2 months: Single Family Home Lives with:: Friends Patient language and need for interpreter reviewed:: Yes Do you feel safe going back to the place where you live?: Yes        Care giver support system in place?: No (comment) Current home services: DME (cane, walker) Criminal Activity/Legal Involvement Pertinent to Current Situation/Hospitalization: No - Comment as needed  Activities of Daily Living Home Assistive Devices/Equipment: None ADL Screening (condition at time of admission) Patient's cognitive ability adequate to safely complete daily activities?:  Yes Is the patient deaf or have difficulty hearing?: No Does the patient have difficulty seeing, even when wearing glasses/contacts?: No Does the patient have difficulty concentrating, remembering, or making decisions?: No Patient able to express need for assistance with ADLs?: Yes Does the patient have difficulty dressing or bathing?: No Independently performs ADLs?: Yes (appropriate for developmental age) Does the patient have difficulty walking or climbing stairs?: Yes Weakness of Legs: Both Weakness of Arms/Hands: None  Permission Sought/Granted                  Emotional Assessment     Affect (typically observed): Appropriate Orientation: : Oriented to Self, Oriented to Place, Oriented to  Time, Oriented to Situation Alcohol / Substance Use: Not Applicable Psych Involvement: No (comment)  Admission diagnosis:  Left hip pain [M25.552] Primary osteoarthritis of both ankles [M19.071, M19.072] Pain of left hip [M25.552] Patient Active Problem List   Diagnosis Date Noted   Fall at home, initial encounter 06/27/2022   Leukocytosis 06/27/2022   Peptic ulcer disease 06/27/2022   Left hip pain 06/26/2022   Gout 12/29/2017   Tobacco abuse 12/29/2017   Acute hypoxemic respiratory failure ([place]) 12/29/2017   Bronchitis 12/29/2017   Rectal bleed 07/30/2014   Hiatal hernia    Hemorrhoid    Acute lower GI bleeding 07/29/2014   Abdominal pain    Wrist  pain, acute    Tenosynovitis of finger, hand, or wrist    Cellulitis of hand 03/10/2014   Right upper quadrant abdominal pain 02/10/2014   Cholelithiasis 02/10/2014   Polyarthritis 11/08/2012   UTI (lower urinary tract infection) 10/21/2012   Cellulitis 09/17/2012   Tinea pedis 09/17/2012   Erosive esophagitis 09/07/2012   Hypokalemia 09/05/2012   Gastric ulcer with hemorrhage 09/02/2012   Upper GI bleed 09/02/2012   Acute blood loss anemia 09/02/2012   Hematemesis 09/01/2012   Normocytic anemia 09/01/2012   Acute gouty  arthritis 09/01/2012   Morbid obesity ([place]) 09/01/2012   PCP:  Neale Burly, MD Pharmacy:   [place], [place] [address] [address] Phone: [phone] Fax: [place], [place] - [id number] [place] [address] [place] [address] [place] [address] Phone: [phone] Fax: [phone]     Social Determinants of Health (SDOH) Social History: SDOH Screenings   Food Insecurity: No Food Insecurity (06/27/2022)  Housing: Low Risk  (06/27/2022)  Transportation Needs: No Transportation Needs (06/27/2022)  Utilities: Not At Risk (06/27/2022)  Tobacco Use: High Risk (04/26/2022)   SDOH Interventions:     Readmission Risk Interventions     No data to display

## 2022-06-27 NOTE — Plan of Care (Signed)
  Problem: Acute Rehab PT Goals(only PT should resolve) Goal: Pt Will Go Supine/Side To Sit Outcome: Progressing Flowsheets (Taken 06/27/2022 1551) Pt will go Supine/Side to Sit:  with modified independence  with supervision Goal: Patient Will Transfer Sit To/From Stand Outcome: Progressing Flowsheets (Taken 06/27/2022 1551) Patient will transfer sit to/from stand:  with minimal assist  with moderate assist Goal: Pt Will Transfer Bed To Chair/Chair To Bed Outcome: Progressing Flowsheets (Taken 06/27/2022 1551) Pt will Transfer Bed to Chair/Chair to Bed: with mod assist Goal: Pt Will Ambulate Outcome: Progressing Flowsheets (Taken 06/27/2022 1551) Pt will Ambulate:  10 feet  with moderate assist  with rolling walker   3:51 PM, 06/27/22 Lonell Grandchild, MPT Physical Therapist with [facility] [phone] office [phone] mobile phone

## 2022-06-27 NOTE — Evaluation (Signed)
Occupational Therapy Evaluation Patient Details Name: [name] MRN: [id number] DOB: 07-08-1985 Today's Date: 06/27/2022   History of Present Illness [name] is a 37 y.o. male with medical history significant of gout, peptic ulcer disease, osteoarthritis who presents to the emergency department via EMS from home due to 5-day onset of increased left hip pain which worsened today after sustaining a fall.  Patient states that he was walking down his front porch steps when he missed a step and sustained a fall landing on his right shoulder.  Patient states that he has not been eating or drinking much especially in the last 2 days due to the left hip pain which started about 5 days ago, so he felt some dizziness while walking down the steps, he felt that he was dehydrated.  Patient also complained of left ankle pain which worsens with sheets touching his left foot.  Patient does not feel that he can take care of himself at home due to difficulty in being able to bear weight on his legs because of the pain, so he activated EMS and was sent to the ED for further evaluation and management. (per DO)   Clinical Impression   Pt agreeable to OT and PT co-evaluation. Pt reports ability to ambulate at baseline. Today pt is suffering from severe pain in L ankle/foot that is limiting function. Pt was able to only partially stand with +2 assist at EOB. Pt was able to scoot to head of bed with supervision and can complete seated upper extremity ADL tasks with set up assist. Mod to max A needed for B LE ADL tasks. Pt has limited help at home as well as many steps to navigate. Pt was left seated in bed with tray in front and call bell within reach. Pt will benefit from continued OT in the hospital and recommended venue below to increase strength, balance, and endurance for safe ADL's.         Recommendations for follow up therapy are one component of a multi-disciplinary discharge planning process, led  by the attending physician.  Recommendations may be updated based on patient status, additional functional criteria and insurance authorization.   Follow Up Recommendations  Skilled nursing-short term rehab (<3 hours/day)     Assistance Recommended at Discharge Intermittent Supervision/Assistance  Patient can return home with the following Two people to help with walking and/or transfers;A lot of help with bathing/dressing/bathroom;Assist for transportation;Help with stairs or ramp for entrance;Assistance with cooking/housework    Functional Status Assessment  Patient has had a recent decline in their functional status and demonstrates the ability to make significant improvements in function in a reasonable and predictable amount of time.  Equipment Recommendations  None recommended by OT    Recommendations for Other Services       Precautions / Restrictions Precautions Precautions: Fall Restrictions Weight Bearing Restrictions: No      Mobility Bed Mobility Overal bed mobility: Needs Assistance Bed Mobility: Supine to Sit     Supine to sit: HOB elevated, Min guard, Supervision     General bed mobility comments: Extended time and labored movement; use of bed rails.    Transfers Overall transfer level: Needs assistance Equipment used: Rolling walker (2 wheels) Transfers: Sit to/from Stand Sit to Stand: Max assist, +2 physical assistance           General transfer comment: Partial sit to stand; pt did not come to full stand. Pt reports much increase in pain in L ankle.  RW used along with gait belt. Unable to safely complete transfer to chair.      Balance Overall balance assessment: Needs assistance Sitting-balance support: Feet supported, No upper extremity supported Sitting balance-Leahy Scale: Good Sitting balance - Comments: seated at EOB   Standing balance support: During functional activity, Bilateral upper extremity supported, Reliant on assistive device  for balance Standing balance-Leahy Scale: Poor Standing balance comment: With RW and assist.                           ADL either performed or assessed with clinical judgement   ADL Overall ADL's : Needs assistance/impaired     Grooming: Set up;Sitting   Upper Body Bathing: Set up;Sitting   Lower Body Bathing: Moderate assistance;Maximal assistance;Sitting/lateral leans   Upper Body Dressing : Set up;Sitting   Lower Body Dressing: Maximal assistance;Moderate assistance;Sitting/lateral leans Lower Body Dressing Details (indicate cue type and reason): Pt was able to slide on his R shoe; assist needed to don sock to L foot. Toilet Transfer: +2 for safety/equipment;Maximal assistance;Total assistance Toilet Transfer Details (indicate cue type and reason): Partially simualted via partial sit to stand at EOB with RW. Toileting- Clothing Manipulation and Hygiene: Minimal assistance;Sitting/lateral lean;Bed level               Vision Baseline Vision/History: 1 Wears glasses Ability to See in Adequate Light: 0 Adequate Patient Visual Report: No change from baseline Vision Assessment?: No apparent visual deficits     Perception     Praxis      Pertinent Vitals/Pain Pain Assessment Pain Assessment: 0-10 Pain Score: 8  Pain Location: L foot/ankle; L hip Pain Descriptors / Indicators: Sharp, Stabbing Pain Intervention(s): Limited activity within patient's tolerance, Monitored during session, Repositioned     Hand Dominance Right   Extremity/Trunk Assessment Upper Extremity Assessment Upper Extremity Assessment: Overall WFL for tasks assessed   Lower Extremity Assessment Lower Extremity Assessment: Defer to PT evaluation   Cervical / Trunk Assessment Cervical / Trunk Assessment: Normal   Communication Communication Communication: No difficulties   Cognition Arousal/Alertness: Awake/alert Behavior During Therapy: WFL for tasks assessed/performed Overall  Cognitive Status: Within Functional Limits for tasks assessed                                                        Home Living Family/patient expects to be discharged to:: Private residence Living Arrangements: Non-relatives/Friends Available Help at Discharge: Friend(s);Available PRN/intermittently Type of Home: Mobile home Home Access: Stairs to enter Entrance Stairs-Number of Steps: 11-12 total Entrance Stairs-Rails: Right ([place] on portion of the stairs going up) Home Layout: One level     Bathroom Shower/Tub: Teacher, early years/pre: Standard Bathroom Accessibility: No   Home Equipment: Conservation officer, nature (2 wheels)          Prior Functioning/Environment Prior Level of Function : Needs assist       Physical Assist : Mobility (physical);ADLs (physical)   ADLs (physical): IADLs Mobility Comments: Pt uses quad cane for short distance ambulation. Pt has RW that is not in great condition. Pt reports he has crawled to get around in the home much in the past, but cane use was most recent. Pt reports he will crawl on the floor in order to transfer at times. ADLs Comments:  Indepndent ADL; pt uses adaptive strategies like a seat for IADL's; assisted by friends for groceries.        OT Problem List: Decreased strength;Decreased activity tolerance;Impaired balance (sitting and/or standing);Obesity;Pain      OT Treatment/Interventions: Self-care/ADL training;Therapeutic exercise;Therapeutic activities;Patient/family education;Balance training;DME and/or AE instruction    OT Goals(Current goals can be found in the care plan section) Acute Rehab OT Goals Patient Stated Goal: Improve function. OT Goal Formulation: With patient Time For Goal Achievement: 07/11/22 Potential to Achieve Goals: Good  OT Frequency: Min 2X/week    Co-evaluation PT/OT/SLP Co-Evaluation/Treatment: Yes Reason for Co-Treatment: To address functional/ADL transfers   OT  goals addressed during session: ADL's and self-care      AM-PAC OT "6 Clicks" Daily Activity     Outcome Measure Help from another person eating meals?: None Help from another person taking care of personal grooming?: A Little Help from another person toileting, which includes using toliet, bedpan, or urinal?: A Little Help from another person bathing (including washing, rinsing, drying)?: A Lot Help from another person to put on and taking off regular upper body clothing?: A Little Help from another person to put on and taking off regular lower body clothing?: A Lot 6 Click Score: 17   End of Session Equipment Utilized During Treatment: Rolling walker (2 wheels);Gait belt  Activity Tolerance: Patient limited by pain Patient left: in bed;with call bell/phone within reach  OT Visit Diagnosis: Unsteadiness on feet (R26.81);Other abnormalities of gait and mobility (R26.89);Muscle weakness (generalized) (M62.81);History of falling (Z91.81);Pain Pain - Right/Left: Left Pain - part of body: Ankle and joints of foot;Hip                Time: [id number] OT Time Calculation (min): 25 min Charges:  OT General Charges $OT Visit: 1 Visit OT Evaluation $OT Eval Low Complexity: 1 Low  Anniece Bleiler OT, MOT  Larey Seat 06/27/2022, 11:32 AM

## 2022-06-27 NOTE — Evaluation (Signed)
Physical Therapy Evaluation Patient Details Name: [name] MRN: [id number] DOB: October 05, 1985 Today's Date: 06/27/2022  History of Present Illness  [name] is a 37 y.o. male with medical history significant of gout, peptic ulcer disease, osteoarthritis who presents to the emergency department via EMS from home due to 5-day onset of increased left hip pain which worsened today after sustaining a fall.  Patient states that he was walking down his front porch steps when he missed a step and sustained a fall landing on his right shoulder.  Patient states that he has not been eating or drinking much especially in the last 2 days due to the left hip pain which started about 5 days ago, so he felt some dizziness while walking down the steps, he felt that he was dehydrated.  Patient also complained of left ankle pain which worsens with sheets touching his left foot.  Patient does not feel that he can take care of himself at home due to difficulty in being able to bear weight on his legs because of the pain, so he activated EMS and was sent to the ED for further evaluation and management.   Clinical Impression  Patient demonstrates fair/good return for sitting up at bedside with slow labored movement, difficulty moving LLE due to ankle pain, unable to come to complete standing after repeated attempts due to generalized weakness, and LLE pain.  Patient tolerated sitting up at bedside to eat breakfast after therapy.  Patient will benefit from continued skilled physical therapy in hospital and recommended venue below to increase strength, balance, endurance for safe ADLs and gait.        Recommendations for follow up therapy are one component of a multi-disciplinary discharge planning process, led by the attending physician.  Recommendations may be updated based on patient status, additional functional criteria and insurance authorization.  Follow Up Recommendations Skilled nursing-short term rehab  (<3 hours/day) Can patient physically be transported by private vehicle: No    Assistance Recommended at Discharge Set up Supervision/Assistance  Patient can return home with the following  A lot of help with bathing/dressing/bathroom;A lot of help with walking and/or transfers;Help with stairs or ramp for entrance;Assistance with cooking/housework    Equipment Recommendations Rolling walker (2 wheels);Other (comment) (Heavy Duty RW)  Recommendations for Other Services       Functional Status Assessment Patient has had a recent decline in their functional status and demonstrates the ability to make significant improvements in function in a reasonable and predictable amount of time.     Precautions / Restrictions Precautions Precautions: Fall Restrictions Weight Bearing Restrictions: No      Mobility  Bed Mobility Overal bed mobility: Needs Assistance Bed Mobility: Supine to Sit     Supine to sit: HOB elevated, Min guard, Supervision     General bed mobility comments: increased time, labored movement    Transfers Overall transfer level: Needs assistance Equipment used: Rolling walker (2 wheels) Transfers: Sit to/from Stand Sit to Stand: Max assist, +2 physical assistance           General transfer comment: limited to partial standing mostly using BUE and RLE, unable to tolerate weightbearing on LLE due to pain    Ambulation/Gait                  Stairs            Wheelchair Mobility    Modified Rankin (Stroke Patients Only)       Balance Overall  balance assessment: Needs assistance Sitting-balance support: Feet supported, No upper extremity supported Sitting balance-Leahy Scale: Good Sitting balance - Comments: seated at EOB   Standing balance support: During functional activity, Reliant on assistive device for balance, Bilateral upper extremity supported Standing balance-Leahy Scale: Poor Standing balance comment: using RW                              Pertinent Vitals/Pain Pain Assessment Pain Assessment: 0-10 Pain Score: 8  Pain Location: L foot/ankle; L hip Pain Descriptors / Indicators: Sharp, Stabbing Pain Intervention(s): Limited activity within patient's tolerance, Monitored during session, Repositioned    Home Living Family/patient expects to be discharged to:: Private residence Living Arrangements: Non-relatives/Friends Available Help at Discharge: Friend(s);Available PRN/intermittently Type of Home: Mobile home Home Access: Stairs to enter Entrance Stairs-Rails: Right Entrance Stairs-Number of Steps: 11-12 total   Home Layout: One level Home Equipment: Conservation officer, nature (2 wheels)      Prior Function Prior Level of Function : Needs assist       Physical Assist : Mobility (physical);ADLs (physical) Mobility (physical): Bed mobility;Transfers;Gait;Stairs   Mobility Comments: Pt uses quad cane for short distance ambulation. Pt has RW that is not in great condition. Pt reports he has crawled to get around in the home much in the past, but cane use was most recent. Pt reports he will crawl on the floor in order to transfer at times. ADLs Comments: Indepndent ADL; pt uses adaptive strategies like a seat for IADL's; assisted by friends for groceries.     Hand Dominance   Dominant Hand: Right    Extremity/Trunk Assessment   Upper Extremity Assessment Upper Extremity Assessment: Defer to OT evaluation    Lower Extremity Assessment Lower Extremity Assessment: Generalized weakness;LLE deficits/detail LLE Deficits / Details: grossly 2+/5, mild/moderate ankle plantar flexor contractrues LLE: Unable to fully assess due to pain LLE Sensation: WNL LLE Coordination: WNL    Cervical / Trunk Assessment Cervical / Trunk Assessment: Normal  Communication   Communication: No difficulties  Cognition Arousal/Alertness: Awake/alert Behavior During Therapy: WFL for tasks assessed/performed Overall  Cognitive Status: Within Functional Limits for tasks assessed                                          General Comments      Exercises     Assessment/Plan    PT Assessment Patient needs continued PT services  PT Problem List Decreased strength;Decreased activity tolerance;Decreased balance;Decreased mobility       PT Treatment Interventions DME instruction;Gait training;Stair training;Functional mobility training;Therapeutic activities;Therapeutic exercise;Patient/family education;Balance training;Wheelchair mobility training    PT Goals (Current goals can be found in the Care Plan section)  Acute Rehab PT Goals Patient Stated Goal: return home after rehab PT Goal Formulation: With patient Time For Goal Achievement: 07/11/22 Potential to Achieve Goals: Good    Frequency Min 3X/week     Co-evaluation PT/OT/SLP Co-Evaluation/Treatment: Yes Reason for Co-Treatment: To address functional/ADL transfers PT goals addressed during session: Mobility/safety with mobility;Balance;Proper use of DME         AM-PAC PT "6 Clicks" Mobility  Outcome Measure Help needed turning from your back to your side while in a flat bed without using bedrails?: None Help needed moving from lying on your back to sitting on the side of a flat bed without using bedrails?: A Little  Help needed moving to and from a bed to a chair (including a wheelchair)?: Total Help needed standing up from a chair using your arms (e.g., wheelchair or bedside chair)?: A Lot Help needed to walk in hospital room?: Total Help needed climbing 3-5 steps with a railing? : Total 6 Click Score: 12    End of Session   Activity Tolerance: Patient tolerated treatment well;Patient limited by fatigue Patient left: in bed;with call bell/phone within reach Nurse Communication: Mobility status PT Visit Diagnosis: Unsteadiness on feet (R26.81);Other abnormalities of gait and mobility (R26.89);Muscle weakness  (generalized) (M62.81)    Time: [id number] PT Time Calculation (min) (ACUTE ONLY): 32 min   Charges:   PT Evaluation $PT Eval Moderate Complexity: 1 Mod PT Treatments $Therapeutic Activity: 23-37 mins        3:50 PM, 06/27/22 Lonell Grandchild, MPT Physical Therapist with [facility] 336 [phone] office [phone] mobile phone

## 2022-06-27 NOTE — NC FL2 (Signed)
[place] LEVEL OF CARE FORM     IDENTIFICATION  Patient Name: [name] Birthdate: 09/09/1985 Sex: male Admission Date (Current Location): 06/26/2022  [place] and [place] Number:  [place] [id number] Facility and Address:  [facility] [address], [place]      Provider Number: [id number]  Attending Physician Name and Address:  Roxan Hockey, MD  Relative Name and Phone Number:       Current Level of Care: Hospital Recommended Level of Care: [place] Prior Approval Number:    Date Approved/Denied:   PASRR Number: [id number]  Discharge Plan: SNF    Current Diagnoses: Patient Active Problem List   Diagnosis Date Noted   Fall at home, initial encounter 06/27/2022   Leukocytosis 06/27/2022   Peptic ulcer disease 06/27/2022   Left hip pain 06/26/2022   Gout 12/29/2017   Tobacco abuse 12/29/2017   Acute hypoxemic respiratory failure ([place]) 12/29/2017   Bronchitis 12/29/2017   Rectal bleed 07/30/2014   Hiatal hernia    Hemorrhoid    Acute lower GI bleeding 07/29/2014   Abdominal pain    Wrist pain, acute    Tenosynovitis of finger, hand, or wrist    Cellulitis of hand 03/10/2014   Right upper quadrant abdominal pain 02/10/2014   Cholelithiasis 02/10/2014   Polyarthritis 11/08/2012   UTI (lower urinary tract infection) 10/21/2012   Cellulitis 09/17/2012   Tinea pedis 09/17/2012   Erosive esophagitis 09/07/2012   Hypokalemia 09/05/2012   Gastric ulcer with hemorrhage 09/02/2012   Upper GI bleed 09/02/2012   Acute blood loss anemia 09/02/2012   Hematemesis 09/01/2012   Normocytic anemia 09/01/2012   Acute gouty arthritis 09/01/2012   Morbid obesity ([place]) 09/01/2012    Orientation RESPIRATION BLADDER Height & Weight     Self, Time, Situation, Place  Normal Continent Weight: (!) 384 lb 4.2 oz (174.3 kg) Height:  '5\' 7"'$  (170.2 cm)  BEHAVIORAL SYMPTOMS/MOOD NEUROLOGICAL BOWEL NUTRITION STATUS       Continent Diet (Regular. See d/c summary for updates.)  AMBULATORY STATUS COMMUNICATION OF NEEDS Skin   Extensive Assist Verbally Normal                       Personal Care Assistance Level of Assistance  Feeding, Bathing, Dressing Bathing Assistance: Maximum assistance Feeding assistance: Limited assistance Dressing Assistance: Maximum assistance     Functional Limitations Info  Hearing, Speech, Sight Sight Info: Adequate Hearing Info: Adequate Speech Info: Adequate    SPECIAL CARE FACTORS FREQUENCY  PT (By licensed PT), OT (By licensed OT)     PT Frequency: 5x weekly              Contractures      Additional Factors Info  Code Status, Allergies Code Status Info: Full code Allergies Info: Asa (aspirin), Clindamycin/lincomycin, Nsaids, Penicillins           Current Medications (06/27/2022):  This is the current hospital active medication list Current Facility-Administered Medications  Medication Dose Route Frequency Provider Last Rate Last Admin   acetaminophen (TYLENOL) tablet 650 mg  650 mg Oral Q6H PRN Adefeso, Oladapo, DO       Or   acetaminophen (TYLENOL) suppository 650 mg  650 mg Rectal Q6H PRN Adefeso, Oladapo, DO       colchicine tablet 0.6 mg  0.6 mg Oral Daily Adefeso, Oladapo, DO   0.6 mg at 06/27/22 0807   enoxaparin (LOVENOX) injection 80 mg  80  mg Subcutaneous Q24H Adefeso, Oladapo, DO   80 mg at 06/26/22 2217   ondansetron (ZOFRAN) tablet 4 mg  4 mg Oral Q6H PRN Adefeso, Oladapo, DO       Or   ondansetron (ZOFRAN) injection 4 mg  4 mg Intravenous Q6H PRN Adefeso, Oladapo, DO       oxyCODONE-acetaminophen (PERCOCET/ROXICET) 5-325 MG per tablet 1 tablet  1 tablet Oral Q4H PRN Adefeso, Oladapo, DO   1 tablet at 06/27/22 0807   pantoprazole (PROTONIX) EC tablet 40 mg  40 mg Oral Daily Adefeso, Oladapo, DO   40 mg at 06/27/22 0807     Discharge Medications: Please see discharge summary for a list of discharge medications.  Relevant  Imaging Results:  Relevant Lab Results:   Additional Information SSN: [id number]  Salome Arnt, LCSW

## 2022-06-27 NOTE — Progress Notes (Signed)
PROGRESS NOTE     [name], is a 37 y.o. male, DOB - [birth year], [id number]  Admit date - 06/26/2022   Admitting Physician Bernadette Hoit, DO  Outpatient Primary MD for the patient is Neale Burly, MD  LOS - 0  Chief Complaint  Patient presents with   Fall        Brief Narrative:  38 y.o. male with medical history significant of gout, peptic ulcer disease, osteoarthritis /remote arthritis and gouty arthritis admitted after mechanical fall on 06/26/2022 with mobility related problems -As of 06/27/2022 patient is medically stable for discharge to SNF facility when a bed can be obtained and insurance authorization is available   -Assessment and Plan: 1) ambulatory dysfunction--- multifactorial due to severe debilitating rheumatoid/gouty/osteoarthritis -Patient is currently on disability for debilitating arthritis -Phy therapy and Occupational Therapy recommends SNF rehab   2)Lt Hip and bilateral ankle pain after mechanical fall at home -Several imaging studies including right ankle, left ankle, left hip and right shoulder showed no acute fracture Continue Percocet 5-325 mg every 4 hours as needed Continue fall precaution  3) gout with tophi--colchicine and allopurinol  4)GERD/PUD--continue Protonix  5)Morbid Obesity- -Low calorie diet, portion control and increase physical activity discussed with patient -Body mass index is 60.18 kg/m.  Status is: Inpatient   Disposition: The patient is from: Home              Anticipated d/c is to: SNF              Anticipated d/c date is: 1 day              Patient currently is medically stable to d/c. Barriers: As of 06/27/2022 patient is medically stable for discharge to SNF facility when a bed can be obtained and insurance authorization is available  Code Status :  -  Code Status: Full Code   Family Communication:    NA (patient is alert, awake and coherent)   DVT Prophylaxis  :   - SCDs /Lovenox  SCDs Start:  06/26/22 2042   Lab Results  Component Value Date   PLT 282 06/27/2022    Inpatient Medications  Scheduled Meds:  colchicine  0.6 mg Oral Daily   enoxaparin (LOVENOX) injection  80 mg Subcutaneous Q24H   gabapentin  300 mg Oral TID   pantoprazole  40 mg Oral Daily   Continuous Infusions: PRN Meds:.acetaminophen **OR** acetaminophen, ondansetron **OR** ondansetron (ZOFRAN) IV, oxyCODONE-acetaminophen   Anti-infectives (From admission, onward)    None         Subjective: [name] today has no fevers, no emesis,  No chest pain,    --Joint pain Eating well As of 06/27/2022 patient is medically stable for discharge to SNF facility when a bed can be obtained and insurance authorization is available   Objective: Vitals:   06/27/22 0800 06/27/22 0800 06/27/22 1159 06/27/22 1303  BP: (!) 140/86 (!) 140/86 105/64 132/79  Pulse: (!) 101 (!) 101 94 95  Resp: '17 17 20 17  '$ Temp: 98 F (36.7 C) 98 F (36.7 C)  98.1 F (36.7 C)  TempSrc:    Oral  SpO2:  94% 90% 95%  Weight:      Height:        Intake/Output Summary (Last 24 hours) at 06/27/2022 1937 Last data filed at 06/27/2022 1832 Gross per 24 hour  Intake 1060 ml  Output 600 ml  Net 460 ml   Filed Weights   06/26/22 1453 06/26/22  2113  Weight: (!) 176.9 kg (!) 174.3 kg    Physical Exam  Gen:- Awake Alert, morbidly obese, in no acute distress HEENT:- .AT, No sclera icterus Neck-Supple Neck,No JVD,.  Lungs-  CTAB , fair symmetrical air movement CV- S1, S2 normal, regular  Abd-  +ve B.Sounds, Abd Soft, No tenderness, increased truncal adiposity Extremity/Skin:- No  edema, pedal pulses present  Psych-affect is appropriate, oriented x3 Neuro-no new focal deficits, no tremors MSK-gouty tophi and deformity of both upper and lower extremity joints from pronounced gouty and rheumatoid arthritis  Data Reviewed: I have personally reviewed following labs and imaging studies  CBC: Recent Labs  Lab  06/26/22 1549 06/27/22 0404  WBC 15.6* 11.1*  NEUTROABS 12.0*  --   HGB 13.6 12.3*  HCT 41.6 38.4*  MCV 88.7 91.4  PLT 328 [id number]   Basic Metabolic Panel: Recent Labs  Lab 06/26/22 1549 06/27/22 0404  NA 138 137  K 3.9 3.8  CL 99 99  CO2 24 28  GLUCOSE 96 110*  BUN 12 10  CREATININE 0.69 0.72  CALCIUM 9.0 8.5*  MG  --  1.9  PHOS  --  4.1   GFR: Estimated Creatinine Clearance: 197.5 mL/min (by C-G formula based on SCr of 0.72 mg/dL). Liver Function Tests: Recent Labs  Lab 06/27/22 0404  AST 13*  ALT 19  ALKPHOS 68  BILITOT 1.0  PROT 7.1  ALBUMIN 2.7*   Radiology Studies: DG Ankle Complete Right  Result Date: 06/26/2022 CLINICAL DATA:  Fall EXAM: RIGHT ANKLE - COMPLETE 3+ VIEW COMPARISON:  05/07/2015 FINDINGS: No definite acute fracture is seen. No dislocation. 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 Irregular appearance of the talar dome, possible avascular necrosis. Suspect ankylosis across the talocalcaneal joint. Progression of degenerative changes at the medial and lateral ankle joint. Diffuse soft tissue swelling IMPRESSION: 1. No definite acute fracture is seen. 2. 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 Irregular appearance of the talar dome, possible avascular necrosis. Suspect ankylosis across the talocalcaneal joint. Electronically Signed   By: Donavan Foil M.D.   On: 06/26/2022 18:25   DG Shoulder Right  Result Date: 06/26/2022 CLINICAL DATA:  Pain with fall. EXAM: RIGHT SHOULDER - 2+ VIEW COMPARISON:  None Available. FINDINGS: Mild acromioclavicular joint space narrowing and peripheral osteophytosis. No significant glenohumeral osteoarthritis. No acute fracture is seen. No dislocation. The visualized portion of the right lung is unremarkable. IMPRESSION: Mild acromioclavicular osteoarthritis. Electronically Signed   By: Yvonne Kendall M.D.   On: 06/26/2022 17:08   DG Hip Unilat W or Wo Pelvis 2-3 Views Left  Result Date:  06/26/2022 CLINICAL DATA:  Left hip pain after fall. EXAM: DG HIP (WITH OR WITHOUT PELVIS) 2-3V LEFT COMPARISON:  Pelvis and left hip radiographs 08/04/2015; CT abdomen and pelvis 07/29/2014 FINDINGS: The bilateral sacroiliac common bilateral femoroacetabular, and pubic symphysis joint spaces appear maintained. Mild bilateral sacroiliac subchondral sclerosis is unchanged. No acute fracture is seen. No dislocation. IMPRESSION: 1. No acute fracture. 2. Mild bilateral sacroiliac subchondral sclerosis, unchanged. Electronically Signed   By: Yvonne Kendall M.D.   On: 06/26/2022 17:07   DG Ankle Complete Left  Result Date: 06/26/2022 CLINICAL DATA:  Fall.  Ankle pain. EXAM: LEFT ANKLE COMPLETE - 3+ VIEW COMPARISON:  Left foot radiographs 06/07/2017 FINDINGS: There is again complete ankylosis of all the tarsometatarsal joints. There is worsened severe navicular-cuneiform and talonavicular severe joint space narrowing. Large dorsal talonavicular degenerative spurring/beaking is similar to  prior. A round sclerotic focus within the base of the first metatarsal is unchanged and may represent a bone island. Moderate distal anterior tibial plafond degenerative spurring is similar to prior. No acute fracture is seen.  No dislocation. IMPRESSION: 1. No acute fracture. 2. 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 3. Worsened severe navicular-cuneiform and talonavicular osteoarthritis. Electronically Signed   By: Yvonne Kendall M.D.   On: 06/26/2022 17:04     Scheduled Meds:  colchicine  0.6 mg Oral Daily   enoxaparin (LOVENOX) injection  80 mg Subcutaneous Q24H   gabapentin  300 mg Oral TID   pantoprazole  40 mg Oral Daily   Continuous Infusions:   LOS: 0 days    Roxan Hockey M.D on 06/27/2022 at 7:37 PM  Go to [email or web address] - for contact info  [facility] - Office  [phone]  If 7PM-7AM, please contact night-coverage [email or web address] 06/27/2022, 7:37 PM

## 2022-06-27 NOTE — Plan of Care (Signed)

## 2022-06-27 NOTE — Evaluation (Addendum)
Mistaken duplicate made.

## 2022-06-28 DIAGNOSIS — M25552 Pain in left hip: Secondary | ICD-10-CM | POA: Diagnosis not present

## 2022-06-28 MED ORDER — PANTOPRAZOLE SODIUM 40 MG PO TBEC
40.0000 mg | DELAYED_RELEASE_TABLET | Freq: Two times a day (BID) | ORAL | Status: DC
  Administered 2022-06-28 – 2022-07-01 (×6): 40 mg via ORAL
  Filled 2022-06-28 (×6): qty 1

## 2022-06-28 MED ORDER — MELATONIN 3 MG PO TABS
6.0000 mg | ORAL_TABLET | Freq: Once | ORAL | Status: AC
  Administered 2022-06-28: 6 mg via ORAL
  Filled 2022-06-28: qty 2

## 2022-06-28 MED ORDER — PREDNISONE 20 MG PO TABS
50.0000 mg | ORAL_TABLET | Freq: Every day | ORAL | Status: DC
  Administered 2022-06-28 – 2022-07-01 (×4): 50 mg via ORAL
  Filled 2022-06-28 (×4): qty 3

## 2022-06-28 MED ORDER — GABAPENTIN 300 MG PO CAPS
300.0000 mg | ORAL_CAPSULE | Freq: Four times a day (QID) | ORAL | Status: DC
  Administered 2022-06-28 – 2022-07-01 (×13): 300 mg via ORAL
  Filled 2022-06-28 (×13): qty 1

## 2022-06-28 NOTE — TOC Progression Note (Signed)
Transition of Care [facility]) - Progression Note    Patient Details  Name: AMMIE BARRILLEAUX MRN: [id number] Date of Birth: Oct 26, 1985  Transition of Care [facility]) CM/SW [place], [place] Phone Number: 06/28/2022, 12:10 PM  Clinical Narrative:    CSW spoke with pt to review bed offers. Pt would like to accept bed offer at [facility]. CSW spoke to [place] in admissions who states they will start pts insurance auth at this time. Pt will need bari bed and wheelchair which the facility will work on getting in the building for pt. TOC to follow.   Expected Discharge Plan: [place] Barriers to Discharge: Continued Medical Work up  Expected Discharge Plan and Services In-house Referral: Clinical Social Work   Post Acute Care Choice: [place] Living arrangements for the past 2 months: Single Family Home                                       Social Determinants of Health (SDOH) Interventions SDOH Screenings   Food Insecurity: No Food Insecurity (06/27/2022)  Housing: Low Risk  (06/27/2022)  Transportation Needs: No Transportation Needs (06/27/2022)  Utilities: Not At Risk (06/27/2022)  Tobacco Use: High Risk (04/26/2022)    Readmission Risk Interventions     No data to display

## 2022-06-28 NOTE — Progress Notes (Signed)
[facility] Progress Note Patient: [name] [id number] DOB: [birth year] DOA: 06/26/2022  DOS: the patient was seen and examined on 06/28/2022  Brief hospital course: PMH of class III obesity, gout, peptic ulcer disease, osteoarthritis presented to the hospital with complaints of a mechanical fall on 3/11. Currently being treated for gout.  Awaiting placement at SNF.  Assessment and Plan: Multiple joint pain secondary to history of arthritis as well as gout. On examination joint appear to be warm to touch bilaterally. Uric acid level is elevated. Suspicion for an acute gout. Patient was actually started on allopurinol and colchicine. Patient reports that he has some diarrhea with the allopurinol although suspect that the GI side effects were more likely associated with colchicine when he was taking it (patient denies that he was taking colchicine when he was on allopurinol). At present we will continue allopurinol as suspected the patient will benefit from some medication for long-term prevention. Switch colchicine to prednisone. Monitor.  Deconditioning. Secondary to possible arthritis as well as gout. Currently being worked up for SNF.  Pain controlled. Patient is on oral narcotics. Reports that the pain is not well-controlled. Reports that his gabapentin does not last long enough. At present will increase gabapentin frequency from 3 times daily to 4 times daily. Would not increase narcotics. Patient is already on a new anti-inflammatory medication with prednisone which should help with the pain as well.  GERD. Patient reports that NSAIDs "about killed him". Currently on steroids. Will increase Protonix to twice daily.  Morbid obesity. Body mass index is 60.18 kg/m.  Placing the patient at high risk for poor outcome. Outpatient conversation with PCP recommended in the setting of severe arthritis.  Fall at home. No fracture seen on x-ray of right ankle, left  ankle, left hip as well as right shoulder. Monitor for now. Awaiting SNF placement.  Subjective: Continues to report pain.  No nausea no vomiting.  No fever no chills.  No diarrhea so far today.  Physical Exam: General: in Mild distress, No Rash Cardiovascular: S1 and S2 Present, No Murmur Respiratory: Good respiratory effort, Bilateral Air entry present. No Crackles, No wheezes Abdomen: Bowel Sound present, No tenderness Extremities: No edema, lower extremity bilateral ankle joint as well as knee joint appears to warm to touch. Neuro: Alert and oriented x3, no new focal deficit  Data Reviewed: I have Reviewed nursing notes, Vitals, and Lab results. Since last encounter, pertinent lab results CBC and BMP   .  Disposition: Status is: Observation  SCDs Start: 06/26/22 2042   Family Communication: No one at bedside Level of care: Med-Surg   Vitals:   06/28/22 0418 06/28/22 0726 06/28/22 1330 06/28/22 1425  BP: 123/70 136/82 130/69 131/80  Pulse: 87 82 78 84  Resp: '18 18 20 18  '$ Temp: 98.7 F (37.1 C) 98.5 F (36.9 C) 98.8 F (37.1 C) 99.1 F (37.3 C)  TempSrc: Oral Oral Oral Oral  SpO2: 95% 95%  92%  Weight:      Height:         Author: Berle Mull, MD 06/28/2022 5:10 PM  Please look on [email or web address] to find out who is on call.

## 2022-06-28 NOTE — Progress Notes (Signed)
Physical Therapy Treatment Patient Details Name: [name] MRN: [id number] DOB: 05/20/1985 Today's Date: 06/28/2022   History of Present Illness [name] is a 37 y.o. male with medical history significant of gout, peptic ulcer disease, osteoarthritis who presents to the emergency department via EMS from home due to 5-day onset of increased left hip pain which worsened today after sustaining a fall.  Patient states that he was walking down his front porch steps when he missed a step and sustained a fall landing on his right shoulder.  Patient states that he has not been eating or drinking much especially in the last 2 days due to the left hip pain which started about 5 days ago, so he felt some dizziness while walking down the steps, he felt that he was dehydrated.  Patient also complained of left ankle pain which worsens with sheets touching his left foot.  Patient does not feel that he can take care of himself at home due to difficulty in being able to bear weight on his legs because of the pain, so he activated EMS and was sent to the ED for further evaluation and management.    PT Comments    Patient agreeable and motivated for therapy.  Patient demonstrates slightly labored movement for sitting up at bedside requiring HOB raised, fair/good return for completing BLE ROM/strengthening exercises while seated at bedside requiring active assistance for LLE LAQ's, and limited ability for bilateral toe raises due to plantar flexor contractures.  Patient tolerated 2 attempts of sit to stands, but only able to lift bottom off bed due to RLE weakness and severe left ankle/foot pain.  Patient tolerated sitting up at bedside after therapy - nursing staff aware.  Patient will benefit from continued skilled physical therapy in hospital and recommended venue below to increase strength, balance, endurance for safe ADLs and gait.      Recommendations for follow up therapy are one component of a  multi-disciplinary discharge planning process, led by the attending physician.  Recommendations may be updated based on patient status, additional functional criteria and insurance authorization.  Follow Up Recommendations  Skilled nursing-short term rehab (<3 hours/day) Can patient physically be transported by private vehicle: No   Assistance Recommended at Discharge Set up Supervision/Assistance  Patient can return home with the following A lot of help with bathing/dressing/bathroom;A lot of help with walking and/or transfers;Help with stairs or ramp for entrance;Assistance with cooking/housework   Equipment Recommendations  Rolling walker (2 wheels);Other (comment)    Recommendations for Other Services       Precautions / Restrictions Precautions Precautions: Fall Restrictions Weight Bearing Restrictions: No     Mobility  Bed Mobility Overal bed mobility: Needs Assistance Bed Mobility: Supine to Sit     Supine to sit: Supervision, HOB elevated     General bed mobility comments: increased time, labored movement    Transfers Overall transfer level: Needs assistance Equipment used: Rolling walker (2 wheels) Transfers: Sit to/from Stand Sit to Stand: Max assist, From elevated surface           General transfer comment: only able to partially lift bottom off bed due to RLE weakness and c/o severe pain left foot/ankle    Ambulation/Gait                   Stairs             Wheelchair Mobility    Modified Rankin (Stroke Patients Only)  Balance Overall balance assessment: Needs assistance Sitting-balance support: Feet supported, No upper extremity supported Sitting balance-Leahy Scale: Good Sitting balance - Comments: seated at EOB   Standing balance support: During functional activity, Reliant on assistive device for balance, Bilateral upper extremity supported Standing balance-Leahy Scale: Poor Standing balance comment: using RW                             Cognition Arousal/Alertness: Awake/alert Behavior During Therapy: WFL for tasks assessed/performed Overall Cognitive Status: Within Functional Limits for tasks assessed                                          Exercises General Exercises - Lower Extremity Long Arc Quad: Seated, AROM, Strengthening, AAROM, 10 reps, Both Hip Flexion/Marching: Seated, AROM, Strengthening, 10 reps, Both Toe Raises: Seated, AROM, Strengthening, Both, 10 reps Heel Raises: Seated, AROM, Strengthening, Both, 10 reps    General Comments        Pertinent Vitals/Pain Pain Assessment Pain Assessment: Faces Faces Pain Scale: Hurts whole lot Pain Location: left ankl/foot with pressure when attempting weightbearing Pain Descriptors / Indicators: Sharp, Stabbing, Grimacing, Guarding Pain Intervention(s): Limited activity within patient's tolerance, Monitored during session, Repositioned, Premedicated before session    Home Living                          Prior Function            PT Goals (current goals can now be found in the care plan section) Acute Rehab PT Goals Patient Stated Goal: return home after rehab PT Goal Formulation: With patient Time For Goal Achievement: 07/11/22 Potential to Achieve Goals: Good Progress towards PT goals: Progressing toward goals    Frequency    Min 3X/week      PT Plan Current plan remains appropriate    Co-evaluation              AM-PAC PT "6 Clicks" Mobility   Outcome Measure  Help needed turning from your back to your side while in a flat bed without using bedrails?: None Help needed moving from lying on your back to sitting on the side of a flat bed without using bedrails?: A Little Help needed moving to and from a bed to a chair (including a wheelchair)?: Total Help needed standing up from a chair using your arms (e.g., wheelchair or bedside chair)?: Total Help needed to walk in  hospital room?: Total Help needed climbing 3-5 steps with a railing? : Total 6 Click Score: 11    End of Session   Activity Tolerance: Patient tolerated treatment well;Patient limited by fatigue;Patient limited by pain Patient left: in bed;with call bell/phone within reach Nurse Communication: Mobility status PT Visit Diagnosis: Unsteadiness on feet (R26.81);Other abnormalities of gait and mobility (R26.89);Muscle weakness (generalized) (M62.81)     Time: [id number] PT Time Calculation (min) (ACUTE ONLY): 30 min  Charges:  $Therapeutic Exercise: 8-22 mins $Therapeutic Activity: 8-22 mins                     2:55 PM, 06/28/22 Lonell Grandchild, MPT Physical Therapist with [facility] [phone] office [phone] mobile phone

## 2022-06-29 DIAGNOSIS — M25552 Pain in left hip: Secondary | ICD-10-CM | POA: Diagnosis not present

## 2022-06-29 NOTE — Progress Notes (Signed)
[facility] PROGRESS NOTE  [name] (DOB: 09-Sep-1985) [id number] PCP: Neale Burly, MD  Brief Narrative: PMH of class III obesity, gout, peptic ulcer disease, osteoarthritis presented to the hospital with complaints of a mechanical fall on 3/11. Currently being treated for gout.  Awaiting placement at SNF.  Subjective: Pain controlled, improved since yesterday, still worst at left ankle.   Objective: BP 125/70   Pulse 80   Temp 97.7 F (36.5 C) (Oral)   Resp 20   Ht '5\' 7"'$  (1.702 m)   Wt (!) 174.3 kg   SpO2 98%   BMI 60.18 kg/m   Gen: Obese male in no distress Pulm: Clear, nonlabored anterolaterally  CV: RRR, no pitting GI: Soft, NT, ND, +BS  Neuro: Alert and oriented. No new focal deficits. Ext: Warm, dry, swan neck deformity of some digits. Tender swelling of left ankle. Tophaceous subcutaneous deposits on left foot/toes. Skin: No acute appearing rashes, lesions or ulcers on visualized skin   Assessment & Plan: Principal Problem:   Left hip pain Active Problems:   Morbid obesity ([place])   Gout   Fall at home, initial encounter   Leukocytosis   Peptic ulcer disease  Multiple joint pain secondary to history of arthritis as well as acute gouty arthropathy:  - Continue allopurinol as suspected the patient will benefit from some medication for long-term prevention and monitor for diarrhea (stopped colchicine) - Continue prednisone with some improvement already noted, plan weeks-long taper.  - Continue increased frequency gabapentin and home narcotic.  - Hx PUD, avoiding NSAIDs.    Deconditioning: Secondary to possible arthritis as well as gout. - Will require rehabilitation at SNF.   GERD. - Augmenting PPI to BID while on steroid.   Morbid obesity: Body mass index is 60.18 kg/m.  - Bariatric surgery referral recommended.    Fall at home: No fracture seen on x-ray of right ankle, left ankle, left hip as well as right shoulder. - Awaiting SNF  placement.  [name], MD [facility] [email or web address] 06/29/2022, 4:55 PM

## 2022-06-29 NOTE — TOC Progression Note (Signed)
Transition of Care [facility]) - Progression Note    Patient Details  Name: [name] MRN: [id number] Date of Birth: Oct 07, 1985  [facility]) CM/SW Contact  Salome Arnt, [place] Phone Number: 06/29/2022, 3:13 PM  Clinical Narrative:  SNF authorization still pending. TOC will follow.      Expected Discharge Plan: Skilled Nursing Facility Barriers to Discharge: Insurance Authorization  Expected Discharge Plan and Services In-house Referral: Clinical Social Work   Post Acute Care Choice: [place] Living arrangements for the past 2 months: [place]                                       Social Determinants of Health (SDOH) Interventions SDOH Screenings   Food Insecurity: No Food Insecurity (06/27/2022)  Housing: Low Risk  (06/27/2022)  Transportation Needs: No Transportation Needs (06/27/2022)  Utilities: Not At Risk (06/27/2022)  Tobacco Use: High Risk (04/26/2022)    Readmission Risk Interventions     No data to display

## 2022-06-29 NOTE — Progress Notes (Signed)
Pt reported severe pain during the night, PRN med given. Pt had large BM during the night. Vitals stable, pt slept through the night.

## 2022-06-30 DIAGNOSIS — M25552 Pain in left hip: Secondary | ICD-10-CM | POA: Diagnosis not present

## 2022-06-30 NOTE — Progress Notes (Signed)
[facility] PROGRESS NOTE  [name] (DOB: 1985-10-20) [id number] PCP: Neale Burly, MD  Brief Narrative: PMH of class III obesity, gout, peptic ulcer disease, osteoarthritis presented to the hospital with complaints of a mechanical fall on 3/11. Currently being treated for gout.  Awaiting placement at SNF.  Subjective: Pain and ROM improving slowly, eager to initiate rehab.  Objective: BP 118/83   Pulse 71   Temp 97.9 F (36.6 C) (Oral)   Resp 20   Ht 5\' 7"  (1.702 m)   Wt (!) 174.3 kg   SpO2 96%   BMI 60.18 kg/m   Gen: Obese no distress Pulm: Clear anterolaterally, nonlabored  CV: RRR, no MRG GI: Soft, NT, ND, +BS  Neuro: Alert and oriented. No new focal deficits. Ext/skin: Left ankle with restricted ROM due to pain and swelling. No erythema. +tenderness to palpation. No warmth to it.  Assessment & Plan: Principal Problem:   Left hip pain Active Problems:   Morbid obesity ([place])   Gout   Fall at home, initial encounter   Leukocytosis   Peptic ulcer disease  Multiple joint pain secondary to history of arthritis as well as acute gouty arthropathy:  - Continue allopurinol as suspected the patient will benefit from some medication for long-term prevention and monitor for diarrhea (stopped colchicine) - Continue prednisone with some improvement already noted, plan weeks-long taper. Remains afebrile with improving exam. - Continue increased frequency gabapentin and home narcotic.  - Hx PUD, avoiding NSAIDs.    Deconditioning: Secondary to possible arthritis as well as gout. - Will require rehabilitation at SNF.   GERD. - Augmenting PPI to BID while on steroid.   Morbid obesity: Body mass index is 60.18 kg/m.  - Bariatric surgery referral recommended.    Fall at home: No fracture seen on x-ray of right ankle, left ankle, left hip as well as right shoulder. - Awaiting SNF placement.  [name], MD [facility] [email or web address] 06/30/2022,  12:08 PM

## 2022-06-30 NOTE — Progress Notes (Signed)
Patient alert and oriented x4. Patient has complained of pain in his L hip/foot throughout the night. PRN Percocet given three times this shift. Vitals stable. Will continue to monitor.

## 2022-06-30 NOTE — TOC Progression Note (Signed)
Transition of Care [facility]) - Progression Note    Patient Details  Name: [name] MRN: [id number] Date of Birth: July 28, 1985  Transition of Care [facility]) CM/SW Contact  Salome Arnt, [place] Phone Number: 06/30/2022, 11:25 AM  Clinical Narrative:  SNF authorization received. [facility] notified and are still awaiting bariatric bed delivery. They will have bariatric bed available tomorrow and can accept then. Pt and MD updated.       Expected Discharge Plan: Skilled Nursing Facility Barriers to Discharge: Other (must enter comment) (bariatric bed)  Expected Discharge Plan and Services In-house Referral: Clinical Social Work   Post Acute Care Choice: [place] Living arrangements for the past 2 months: Single Family Home Expected Discharge Date: 06/30/22                                     Social Determinants of Health (SDOH) Interventions SDOH Screenings   Food Insecurity: No Food Insecurity (06/27/2022)  Housing: Low Risk  (06/27/2022)  Transportation Needs: No Transportation Needs (06/27/2022)  Utilities: Not At Risk (06/27/2022)  Tobacco Use: High Risk (04/26/2022)    Readmission Risk Interventions     No data to display

## 2022-07-01 DIAGNOSIS — M25552 Pain in left hip: Secondary | ICD-10-CM | POA: Diagnosis not present

## 2022-07-01 MED ORDER — PANTOPRAZOLE SODIUM 40 MG PO TBEC: 40.0000 mg | DELAYED_RELEASE_TABLET | Freq: Two times a day (BID) | ORAL | Status: AC

## 2022-07-01 MED ORDER — ALLOPURINOL 100 MG PO TABS: 100.0000 mg | ORAL_TABLET | Freq: Every day | ORAL | Status: AC

## 2022-07-01 MED ORDER — ACETAMINOPHEN 325 MG PO TABS: 650.0000 mg | ORAL_TABLET | Freq: Four times a day (QID) | ORAL | Status: AC | PRN

## 2022-07-01 MED ORDER — GABAPENTIN 300 MG PO CAPS: 300.0000 mg | ORAL_CAPSULE | Freq: Four times a day (QID) | ORAL | Status: AC

## 2022-07-01 MED ORDER — OXYCODONE-ACETAMINOPHEN 5-325 MG PO TABS: 1.0000 | ORAL_TABLET | Freq: Four times a day (QID) | ORAL | 0 refills | Status: AC | PRN

## 2022-07-01 MED ORDER — PREDNISONE 10 MG PO TABS: ORAL_TABLET | ORAL | 0 refills | Status: AC

## 2022-07-01 NOTE — Progress Notes (Signed)
Nsg Discharge Note  Admit Date:  06/26/2022 Discharge date: 07/01/2022   [name] to be D/C'd Skilled nursing facility per MD order.  AVS completed. Patient/caregiver able to verbalize understanding.  Discharge Medication: Allergies as of 07/01/2022       Reactions   Asa [aspirin] Other (See Comments)   Pt doesn't know reaction.   Clindamycin/lincomycin    Gastric ulcer    Nsaids Other (See Comments)   Causes Ulcers    Penicillins Other (See Comments)   Pt doesn't know reaction. Has patient had a PCN reaction causing immediate rash, facial/tongue/throat swelling, SOB or lightheadedness with hypotension: No Has patient had a PCN reaction causing severe rash involving mucus membranes or skin necrosis: No Has patient had a PCN reaction that required hospitalization No Has patient had a PCN reaction occurring within the last 10 years: No If all of the above answers are "NO", then may proceed with Cephalosporin use.   Sulfa Antibiotics Other (See Comments)   Pt doesn't know reaction.        Medication List     STOP taking these medications    colchicine 0.6 MG tablet       TAKE these medications    acetaminophen 325 MG tablet Commonly known as: TYLENOL Take 2 tablets (650 mg total) by mouth every 6 (six) hours as needed for mild pain. What changed: reasons to take this   allopurinol 100 MG tablet Commonly known as: ZYLOPRIM Take 1 tablet (100 mg total) by mouth daily. Start taking on: July 02, 2022   gabapentin 300 MG capsule Commonly known as: NEURONTIN Take 1 capsule (300 mg total) by mouth 4 (four) times daily. What changed:  when to take this reasons to take this   oxyCODONE-acetaminophen 5-325 MG tablet Commonly known as: PERCOCET/ROXICET Take 1 tablet by mouth every 6 (six) hours as needed for up to 5 days for moderate pain or severe pain.   pantoprazole 40 MG tablet Commonly known as: PROTONIX Take 1 tablet (40 mg total) by mouth 2 (two)  times daily before a meal.   predniSONE 10 MG tablet Commonly known as: DELTASONE Take 5 tablets (50 mg total) by mouth daily with breakfast for 7 days, THEN 4 tablets (40 mg total) daily with breakfast for 7 days, THEN 3 tablets (30 mg total) daily with breakfast for 7 days, THEN 2 tablets (20 mg total) daily with breakfast for 7 days, THEN 1 tablet (10 mg total) daily with breakfast for 7 days. Start taking on: July 02, 2022        Discharge Assessment: Vitals:   06/30/22 2100 07/01/22 0300  BP: 123/73 131/79  Pulse: (!) 58 64  Resp: 18 16  Temp: 98.3 F (36.8 C) 97.8 F (36.6 C)  SpO2: 95% 95%   Skin clean, dry and intact without evidence of skin break down, no evidence of skin tears noted. IV catheter discontinued intact. Site without signs and symptoms of complications - no redness or edema noted at insertion site, patient denies c/o pain - only slight tenderness at site.  Dressing with slight pressure applied.  D/c Instructions-Education: Discharge instructions given to patient/family with verbalized understanding. D/c education completed with patient/family including follow up instructions, medication list, d/c activities limitations if indicated, with other d/c instructions as indicated by MD - patient able to verbalize understanding, all questions fully answered. Patient instructed to return to ED, call 911, or call MD for any changes in condition.  Patient escorted via [place],  and D/C home via private auto.  Kathie Rhodes, RN 07/01/2022 12:01 PM

## 2022-07-01 NOTE — Progress Notes (Signed)
Attempted to call report to [place]. No answer, message left to call back.

## 2022-07-01 NOTE — Progress Notes (Signed)
PRN Percocet given for reported pain. Pt slept through the night.

## 2022-07-01 NOTE — TOC Transition Note (Addendum)
Transition of Care [facility]) - CM/SW Discharge Note   Patient Details  Name: [name] MRN: [id number] Date of Birth: 23-Aug-1985  Transition of Care [facility]) CM/SW Contact:  Kimber Relic, LCSW Phone Number: 07/01/2022, 11:22 AM   Clinical Narrative:    This CSW contacted Debbie at [facility] to verify they have a bariatric bed for the pt. Debbie confirmed they do. She will pull d/c summary. This Probation officer noted d/c summary is in. RN notified of report information via secure chat. RN to call transport. Medical Necessity completed. No further TOC needs at this time.      Barriers to Discharge: Other (must enter comment) (bariatric bed)   Patient Goals and CMS Choice   Choice offered to / list presented to : Patient  Discharge Placement                         Discharge Plan and Services Additional resources added to the After Visit Summary for   In-house Referral: Clinical Social Work   Post Acute Care Choice: [place]                               Social Determinants of Health (SDOH) Interventions SDOH Screenings   Food Insecurity: No Food Insecurity (06/27/2022)  Housing: Low Risk  (06/27/2022)  Transportation Needs: No Transportation Needs (06/27/2022)  Utilities: Not At Risk (06/27/2022)  Tobacco Use: High Risk (04/26/2022)     Readmission Risk Interventions     No data to display

## 2022-07-01 NOTE — Discharge Summary (Signed)
Physician Discharge Summary   Patient: [name] MRN: [id number] DOB: August 02, 1985  Admit date:     06/26/2022  Discharge date: 07/01/22  Discharge Physician: [name]   PCP: Neale Burly, MD   Recommendations at discharge:  Continue treatment for acute gout flare at left ankle and management of arthritis-related chronic pain while rehabilitating at [facility] after admission for mechanical fall at home.  Recommend bariatric surgery referral and close PCP follow up.  Discharge Diagnoses: Principal Problem:   Left hip pain Active Problems:   Morbid obesity ([place])   Gout   Fall at home, initial encounter   Leukocytosis   Peptic ulcer disease  Hospital Course: [name] is a 37 y.o. male with a history of morbid obesity, gout, NSAID-induced PUD, severe osteoarthritis, and gout who presented to [facility] 3/11 after a mechanical fall at home. Radiographs were negative for dislocation or fracture, though he will require rehabilitation at SNF which is pending. The left ankle became acutely more tender for which prednisone was started for acute gout flare. Pain has improved.   Assessment and Plan: Multiple joint pain secondary to history of arthritis as well as acute gouty arthropathy:  - Continue allopurinol as suspected the patient will benefit from some medication for long-term prevention. Pt reported diarrhea with this in the past, but was also on colchicine at some point (pt doesn't believe it was concomitant). Thus, continue monitoring for diarrhea, though not reported at this time.  - Continue prednisone with some improvement already noted, plan weeks-long taper. Remains afebrile with improving exam. - Continue increased frequency gabapentin and home narcotic.  - Hx PUD, avoiding NSAIDs.     Deconditioning: Secondary to obesity, arthritis as well as gout. - Will require rehabilitation at SNF.    GERD. - Augmenting PPI to BID while on steroid.   Morbid obesity: Body mass  index is 60.18 kg/m.  - Bariatric surgery referral recommended.    Fall at home: No fracture seen on x-ray of right ankle, left ankle, left hip as well as right shoulder. - Awaiting SNF placement, pt eager to pursue rehabilitation  Consultants: None Procedures performed: None  Disposition: Skilled nursing facility Diet recommendation:  Cardiac diet DISCHARGE MEDICATION: Allergies as of 07/01/2022       Reactions   Asa [aspirin] Other (See Comments)   Pt doesn't know reaction.   Clindamycin/lincomycin    Gastric ulcer    Nsaids Other (See Comments)   Causes Ulcers    Penicillins Other (See Comments)   Pt doesn't know reaction. Has patient had a PCN reaction causing immediate rash, facial/tongue/throat swelling, SOB or lightheadedness with hypotension: No Has patient had a PCN reaction causing severe rash involving mucus membranes or skin necrosis: No Has patient had a PCN reaction that required hospitalization No Has patient had a PCN reaction occurring within the last 10 years: No If all of the above answers are "NO", then may proceed with Cephalosporin use.   Sulfa Antibiotics Other (See Comments)   Pt doesn't know reaction.        Medication List     STOP taking these medications    colchicine 0.6 MG tablet       TAKE these medications    acetaminophen 325 MG tablet Commonly known as: TYLENOL Take 2 tablets (650 mg total) by mouth every 6 (six) hours as needed for mild pain. What changed: reasons to take this   allopurinol 100 MG tablet Commonly known as: ZYLOPRIM Take  1 tablet (100 mg total) by mouth daily. Start taking on: July 02, 2022   gabapentin 300 MG capsule Commonly known as: NEURONTIN Take 1 capsule (300 mg total) by mouth 4 (four) times daily. What changed:  when to take this reasons to take this   oxyCODONE-acetaminophen 5-325 MG tablet Commonly known as: PERCOCET/ROXICET Take 1 tablet by mouth every 6 (six) hours as needed for up to 5  days for moderate pain or severe pain.   pantoprazole 40 MG tablet Commonly known as: PROTONIX Take 1 tablet (40 mg total) by mouth 2 (two) times daily before a meal.   predniSONE 10 MG tablet Commonly known as: DELTASONE Take 5 tablets (50 mg total) by mouth daily with breakfast for 7 days, THEN 4 tablets (40 mg total) daily with breakfast for 7 days, THEN 3 tablets (30 mg total) daily with breakfast for 7 days, THEN 2 tablets (20 mg total) daily with breakfast for 7 days, THEN 1 tablet (10 mg total) daily with breakfast for 7 days. Start taking on: July 02, 2022        Contact information for follow-up providers     Neale Burly, MD Follow up.   Specialty: Internal Medicine Contact information: [place] [phone] [phone]              Contact information for after-discharge care     Destination     [facility] .   Service: Skilled Nursing Contact information: [address] [place] [phone]                    Discharge Exam: [name] Weights   06/26/22 1453 06/26/22 2113  Weight: (!) 176.9 kg (!) 174.3 kg  BP 131/79 (BP Location: Left Arm)   Pulse 64   Temp 97.8 F (36.6 C) (Oral)   Resp 16   Ht 5\' 7"  (1.702 m)   Wt (!) 174.3 kg   SpO2 95%   BMI 60.18 kg/m   Gen: Obese no distress Pulm: Nonlabored  Neuro: Alert and oriented. No new focal deficits. Ext/skin: Left ankle with restricted ROM due to pain and swelling. No erythema. +tenderness to palpation. No warmth to it. Tophaceous deposits on great toe. 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  Condition at discharge: stable  The results of significant diagnostics from this hospitalization (including imaging, microbiology, ancillary and laboratory) are listed below for reference.   Imaging Studies: DG Ankle Complete Right  Result Date: 06/26/2022 CLINICAL DATA:  Fall EXAM: RIGHT ANKLE -  COMPLETE 3+ VIEW COMPARISON:  05/07/2015 FINDINGS: No definite acute fracture is seen. No dislocation. 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 Irregular appearance of the talar dome, possible avascular necrosis. Suspect ankylosis across the talocalcaneal joint. Progression of degenerative changes at the medial and lateral ankle joint. Diffuse soft tissue swelling IMPRESSION: 1. No definite acute fracture is seen. 2. 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 Irregular appearance of the talar dome, possible avascular necrosis. Suspect ankylosis across the talocalcaneal joint. Electronically Signed   By: Donavan Foil M.D.   On: 06/26/2022 18:25   DG Shoulder Right  Result Date: 06/26/2022 CLINICAL DATA:  Pain with fall. EXAM: RIGHT SHOULDER - 2+ VIEW COMPARISON:  None Available. FINDINGS: Mild acromioclavicular joint space narrowing and peripheral osteophytosis. No significant glenohumeral osteoarthritis. No acute fracture is seen. No dislocation. The visualized portion of  the right lung is unremarkable. IMPRESSION: Mild acromioclavicular osteoarthritis. Electronically Signed   By: Yvonne Kendall M.D.   On: 06/26/2022 17:08   DG Hip Unilat W or Wo Pelvis 2-3 Views Left  Result Date: 06/26/2022 CLINICAL DATA:  Left hip pain after fall. EXAM: DG HIP (WITH OR WITHOUT PELVIS) 2-3V LEFT COMPARISON:  Pelvis and left hip radiographs 08/04/2015; CT abdomen and pelvis 07/29/2014 FINDINGS: The bilateral sacroiliac common bilateral femoroacetabular, and pubic symphysis joint spaces appear maintained. Mild bilateral sacroiliac subchondral sclerosis is unchanged. No acute fracture is seen. No dislocation. IMPRESSION: 1. No acute fracture. 2. Mild bilateral sacroiliac subchondral sclerosis, unchanged. Electronically Signed   By: Yvonne Kendall M.D.   On: 06/26/2022 17:07   DG Ankle Complete Left  Result Date: 06/26/2022 CLINICAL DATA:  Fall.  Ankle pain. 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 There is worsened severe navicular-cuneiform and talonavicular severe joint space narrowing. Large dorsal talonavicular degenerative spurring/beaking is similar to prior. A round sclerotic focus within the base of the first metatarsal is unchanged and may represent a bone island. Moderate distal anterior tibial plafond degenerative spurring is similar to prior. No acute fracture is seen.  No dislocation. IMPRESSION: 1. No acute fracture. 2. 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 3. Worsened severe navicular-cuneiform and talonavicular osteoarthritis. Electronically Signed   By: Yvonne Kendall M.D.   On: 06/26/2022 17:04    Microbiology: Results for orders placed or performed during the hospital encounter of 07/28/14  Clostridium Difficile by PCR     Status: None   Collection Time: 07/29/14  6:55 PM   Specimen: Stool  Result Value Ref Range Status   Toxigenic C. Difficile by PCR NEGATIVE NEGATIVE Final    Labs: CBC: Recent Labs  Lab 06/26/22 1549 06/27/22 0404  WBC 15.6* 11.1*  NEUTROABS 12.0*  --   HGB 13.6 12.3*  HCT 41.6 38.4*  MCV 88.7 91.4  PLT 328 [id number]   Basic Metabolic Panel: Recent Labs  Lab 06/26/22 1549 06/27/22 0404  NA 138 137  K 3.9 3.8  CL 99 99  CO2 24 28  GLUCOSE 96 110*  BUN 12 10  CREATININE 0.69 0.72  CALCIUM 9.0 8.5*  MG  --  1.9  PHOS  --  4.1   Liver Function Tests: Recent Labs  Lab 06/27/22 0404  AST 13*  ALT 19  ALKPHOS 68  BILITOT 1.0  PROT 7.1  ALBUMIN 2.7*   CBG: No results for input(s): "GLUCAP" in the last 168 hours.  Discharge time spent: greater than 30 minutes.  Signed: [name], MD [facility] 07/01/2022

## 2022-07-31 ENCOUNTER — Encounter: Payer: Self-pay | Admitting: Orthopedic Surgery

## 2022-07-31 ENCOUNTER — Ambulatory Visit: Payer: Medicaid Other | Admitting: Orthopedic Surgery

## 2022-07-31 VITALS — BP 159/104 | HR 97 | Ht 67.0 in | Wt >= 6400 oz

## 2022-07-31 DIAGNOSIS — M25552 Pain in left hip: Secondary | ICD-10-CM | POA: Diagnosis not present

## 2022-07-31 DIAGNOSIS — M25551 Pain in right hip: Secondary | ICD-10-CM

## 2022-07-31 DIAGNOSIS — R2689 Other abnormalities of gait and mobility: Secondary | ICD-10-CM | POA: Diagnosis not present

## 2022-07-31 NOTE — Patient Instructions (Signed)
CT scan left hip   MRI Lumbar spine

## 2022-07-31 NOTE — Progress Notes (Signed)
Office Visit Note   Patient: [name]           Date of Birth: 10/31/1985           MRN: [id number] Visit Date: 07/31/2022 Requested by: Toma Deiters, MD [address] [place],  [place] [address] PCP: Toma Deiters, MD  Subjective: Chief Complaint  Patient presents with   Hip Pain    Left / states can't lift left leg/has left hip pain since fall one month ago had xrays at hospital     HPI: 37 year old male admitted to local nursing facility after admission to [facility] secondary to syncope and pain left hip  Patient says that 2 to 3 days before he fell he was having some discomfort in his left hip area and then got dizzy 2 days later and then fell down the steps.  He was admitted to the hospital x-rays were taken they were negative his syncopal episode was worked up and he was sent to the facility because he cannot walk  He complains of pain on the left side of his hip and left lower back              ROS: History of multiple issues with his ankle joints with decreased range of motion  Assessment & Plan:  Images personally read and my interpretation : X-rays of his hip show no abnormalities of the hip no fracture no arthritis no femoral head changes  Visit Diagnoses: No diagnosis found.  Plan: Unclear diagnosis  He most likely has a spine problem  Recommend further imaging CT scan of his hip to rule out fracture  MRI lumbar spine to rule out degenerative disc disease/disc herniation as a cause of left lower leg pain and left hip pain  Follow-Up Instructions: Return for DR WIL CALL RESUTS TO YOU.   Orders:  No orders of the defined types were placed in this encounter.     Objective: Vital Signs: BP (!) 159/104   Pulse 97   Ht 5\' 7"  (1.702 m)   Wt (!) 400 lb (181.4 kg)   BMI 62.65 kg/m   Physical Exam Vitals and nursing note reviewed.  Constitutional:      Appearance: Normal appearance. He is obese.  HENT:     Head: Normocephalic and  atraumatic.  Eyes:     General: No scleral icterus.       Right eye: No discharge.        Left eye: No discharge.     Extraocular Movements: Extraocular movements intact.     Conjunctiva/sclera: Conjunctivae normal.     Pupils: Pupils are equal, round, and reactive to light.  Cardiovascular:     Rate and Rhythm: Normal rate.     Pulses: Normal pulses.  Skin:    General: Skin is warm and dry.     Capillary Refill: Capillary refill takes less than 2 seconds.  Neurological:     General: No focal deficit present.     Mental Status: He is alert and oriented to person, place, and time.     Gait: Gait abnormal.  Psychiatric:        Mood and Affect: Mood normal.        Behavior: Behavior normal.        Thought Content: Thought content normal.        Judgment: Judgment normal.      Ortho Exam  I did get him to stand up with assistance  as he held onto a chair in front of him  He cannot actively lift his left hip as well as he can the right in terms of his hip flexion but is tenderness is on the posterior aspect of his left thigh along the left greater trochanter left gluteal region and left side and central area of the lower lumbar spine  His ankle motion is already diminished by his arthritis and degenerative changes in the ankle joints his knee extension was normal   Specialty Comments:  No specialty comments available.  Imaging: No results found.   PMFS History: Patient Active Problem List   Diagnosis Date Noted   Fall at home, initial encounter 06/27/2022   Leukocytosis 06/27/2022   Peptic ulcer disease 06/27/2022   Left hip pain 06/26/2022   Gout 12/29/2017   Tobacco abuse 12/29/2017   Acute hypoxemic respiratory failure 12/29/2017   Bronchitis 12/29/2017   Rectal bleed 07/30/2014   Hiatal hernia    Hemorrhoid    Acute lower GI bleeding 07/29/2014   Abdominal pain    Wrist pain, acute    Tenosynovitis of finger, hand, or wrist    Cellulitis of hand 03/10/2014    Right upper quadrant abdominal pain 02/10/2014   Cholelithiasis 02/10/2014   Polyarthritis 11/08/2012   UTI (lower urinary tract infection) 10/21/2012   Cellulitis 09/17/2012   Tinea pedis 09/17/2012   Erosive esophagitis 09/07/2012   Hypokalemia 09/05/2012   Gastric ulcer with hemorrhage 09/02/2012   Upper GI bleed 09/02/2012   Acute blood loss anemia 09/02/2012   Hematemesis 09/01/2012   Normocytic anemia 09/01/2012   Acute gouty arthritis 09/01/2012   Morbid obesity 09/01/2012   Past Medical History:  Diagnosis Date   Acute blood loss anemia 09/02/2012   S/p 1 unit rbcs   Cellulitis    Erosive esophagitis 09/01/2012   NSAID induced.   Gall stones    Gastric ulcer with hemorrhage 09/02/2012   s/p bleeding control tx. Per Dr. Jena Gauss   Gout    Obesity    UTI (lower urinary tract infection)     Family History  Problem Relation Age of Onset   Heart failure Mother    Colon cancer Neg Hx     Past Surgical History:  Procedure Laterality Date   COLONOSCOPY WITH PROPOFOL N/A 07/30/2014   RMR: Colonoscopy anal canal/internal hemorrhoids, otherwise normal ileocolonoscoopy.    ESOPHAGOGASTRODUODENOSCOPY Left 09/01/2012   [id number] esophageal erosions consistent with erosive reflux esophagitis/Pre-pyloric benign-appearing gastric ulcer with bleeding stigmata status post bleeding control therapy as described above   ESOPHAGOGASTRODUODENOSCOPY N/A 12/13/2012   [id number] hernia. PReviously noted gastric ulcer completely healed.    ESOPHAGOGASTRODUODENOSCOPY (EGD) WITH PROPOFOL N/A 07/30/2014   Procedure: ESOPHAGOGASTRODUODENOSCOPY (EGD) WITH PROPOFOL;  Surgeon: Corbin Ade, MD;  Location: [facility];  Service: Endoscopy;  Laterality: N/A;   TONSILLECTOMY     Social History   Occupational History   Not on file  Tobacco Use   Smoking status: Heavy Smoker    Packs/day: 1.00    Years: 7.00    Additional pack years: 0.00    Total pack years: 7.00    Types: Cigarettes   Smokeless  tobacco: Current    Types: Snuff   Tobacco comments:    daily in the evenings\  Substance and Sexual Activity   Alcohol use: No    Alcohol/week: 0.0 standard drinks of alcohol   Drug use: Yes    Types: Marijuana    Comment: if in pain  Sexual activity: Never

## 2022-08-08 ENCOUNTER — Telehealth: Payer: Self-pay | Admitting: Orthopedic Surgery

## 2022-08-08 NOTE — Telephone Encounter (Signed)
Dr. Mort Sawyers pt [name] [phone] w/[facility] called for the patient.  She stated that the patient is wanting to know if we have obtained approval for his CT and MRI.  He has not scheduled it it.

## 2022-08-08 NOTE — Telephone Encounter (Signed)
No CT pending review I checked on it today and its still pending review with Medicaid  Looks like MRI is not pending yet. It has not been submitted  I apologized said waiting for the Medicaid to approve.   To you FYI

## 2022-08-11 ENCOUNTER — Emergency Department (HOSPITAL_COMMUNITY)
Admission: EM | Admit: 2022-08-11 | Discharge: 2022-08-12 | Disposition: A | Discharge status: Skilled Nursing Facility | Payer: Medicaid Other | Attending: Emergency Medicine | Admitting: Emergency Medicine

## 2022-08-11 ENCOUNTER — Other Ambulatory Visit: Payer: Self-pay

## 2022-08-11 ENCOUNTER — Encounter (HOSPITAL_COMMUNITY): Payer: Self-pay | Admitting: Emergency Medicine

## 2022-08-11 DIAGNOSIS — R1011 Right upper quadrant pain: Secondary | ICD-10-CM | POA: Diagnosis present

## 2022-08-11 DIAGNOSIS — K810 Acute cholecystitis: Secondary | ICD-10-CM | POA: Insufficient documentation

## 2022-08-11 DIAGNOSIS — F17210 Nicotine dependence, cigarettes, uncomplicated: Secondary | ICD-10-CM | POA: Diagnosis not present

## 2022-08-11 DIAGNOSIS — E279 Disorder of adrenal gland, unspecified: Secondary | ICD-10-CM

## 2022-08-11 DIAGNOSIS — K8020 Calculus of gallbladder without cholecystitis without obstruction: Secondary | ICD-10-CM | POA: Diagnosis not present

## 2022-08-11 NOTE — ED Triage Notes (Signed)
Pt with c/o R upper abdominal pain that pt states he can "feel in his back" as well. Pt from [facility].

## 2022-08-11 NOTE — ED Notes (Signed)
Urinal given to pt when pt needs to go. Pt states that he just went prior to coming.

## 2022-08-12 ENCOUNTER — Emergency Department (HOSPITAL_COMMUNITY): Payer: Medicaid Other

## 2022-08-12 DIAGNOSIS — K8020 Calculus of gallbladder without cholecystitis without obstruction: Secondary | ICD-10-CM

## 2022-08-12 LAB — COMPREHENSIVE METABOLIC PANEL
ALT: 20 U/L (ref 0–44)
AST: 13 U/L — ABNORMAL LOW (ref 15–41)
Albumin: 3.1 g/dL — ABNORMAL LOW (ref 3.5–5.0)
Alkaline Phosphatase: 54 U/L (ref 38–126)
Anion gap: 8 (ref 5–15)
BUN: 18 mg/dL (ref 6–20)
CO2: 29 mmol/L (ref 22–32)
Calcium: 8.7 mg/dL — ABNORMAL LOW (ref 8.9–10.3)
Chloride: 103 mmol/L (ref 98–111)
Creatinine, Ser: 0.64 mg/dL (ref 0.61–1.24)
GFR, Estimated: >60 mL/min (ref >60)
Glucose, Bld: 127 mg/dL — ABNORMAL HIGH (ref 70–99)
Potassium: 3.5 mmol/L (ref 3.5–5.1)
Sodium: 140 mmol/L (ref 135–145)
Total Bilirubin: 0.6 mg/dL (ref 0.3–1.2)
Total Protein: 6.3 g/dL — ABNORMAL LOW (ref 6.5–8.1)

## 2022-08-12 LAB — URINALYSIS, ROUTINE W REFLEX MICROSCOPIC
Bilirubin Urine: NEGATIVE
Glucose, UA: NEGATIVE mg/dL
Hgb urine dipstick: NEGATIVE
Ketones, ur: NEGATIVE mg/dL
Leukocytes,Ua: NEGATIVE
Nitrite: NEGATIVE
Protein, ur: NEGATIVE mg/dL
Specific Gravity, Urine: 1.015 (ref 1.005–1.030)
pH: 5 (ref 5.0–8.0)

## 2022-08-12 LAB — CBC
HCT: 41.4 % (ref 39.0–52.0)
Hemoglobin: 12.9 g/dL — ABNORMAL LOW (ref 13.0–17.0)
MCH: 29.1 pg (ref 26.0–34.0)
MCHC: 31.2 g/dL (ref 30.0–36.0)
MCV: 93.5 fL (ref 80.0–100.0)
Platelets: 245 10*3/uL (ref 150–400)
RBC: 4.43 MIL/uL (ref 4.22–5.81)
RDW: 14.6 % (ref 11.5–15.5)
WBC: 8.4 10*3/uL (ref 4.0–10.5)
nRBC: 0 % (ref 0.0–0.2)

## 2022-08-12 LAB — LIPASE, BLOOD: Lipase: 35 U/L (ref 11–51)

## 2022-08-12 MED ORDER — MORPHINE SULFATE (PF) 4 MG/ML IV SOLN
4.0000 mg | Freq: Once | INTRAVENOUS | Status: AC
  Administered 2022-08-12: 4 mg via INTRAVENOUS
  Filled 2022-08-12: qty 1

## 2022-08-12 MED ORDER — ONDANSETRON HCL 4 MG/2ML IJ SOLN
4.0000 mg | Freq: Once | INTRAMUSCULAR | Status: AC
  Administered 2022-08-12: 4 mg via INTRAVENOUS
  Filled 2022-08-12: qty 2

## 2022-08-12 MED ORDER — ONDANSETRON 4 MG PO TBDP: 4.0000 mg | ORAL_TABLET | Freq: Three times a day (TID) | ORAL | 0 refills | Status: DC | PRN

## 2022-08-12 MED ORDER — SODIUM CHLORIDE 0.9 % IV BOLUS: 500.0000 mL | Freq: Once | INTRAVENOUS | Status: DC

## 2022-08-12 MED ORDER — HYDROMORPHONE HCL 1 MG/ML IJ SOLN
1.0000 mg | Freq: Once | INTRAMUSCULAR | Status: AC
  Administered 2022-08-12: 1 mg via INTRAVENOUS
  Filled 2022-08-12: qty 1

## 2022-08-12 MED ORDER — IOHEXOL 300 MG/ML  SOLN
100.0000 mL | Freq: Once | INTRAMUSCULAR | Status: AC | PRN
  Administered 2022-08-12: 100 mL via INTRAVENOUS

## 2022-08-12 MED ORDER — SODIUM CHLORIDE 0.9 % IV BOLUS
1000.0000 mL | Freq: Once | INTRAVENOUS | Status: AC
  Administered 2022-08-12: 1000 mL via INTRAVENOUS

## 2022-08-12 NOTE — ED Provider Notes (Signed)
[facility] DEPT [facility] Emergency Department Provider Note MRN:  [id number]  Arrival date & time: 08/12/22     Chief Complaint   Abdominal Pain   History of Present Illness   [name] is a 37 y.o. year-old male with a history of gastritis presenting to the ED with chief complaint of abdominal pain.  Right upper quadrant abdominal pain with radiation to the back of the past several hours.  Severe, associated with nausea.  Review of Systems  A thorough review of systems was obtained and all systems are negative except as noted in the HPI and PMH.   Patient's Health History    Past Medical History:  Diagnosis Date   Acute blood loss anemia 09/02/2012   S/p 1 unit rbcs   Cellulitis    Erosive esophagitis 09/01/2012   NSAID induced.   Gall stones    Gastric ulcer with hemorrhage 09/02/2012   s/p bleeding control tx. Per Dr. Jena Gauss   Gout    Obesity    UTI (lower urinary tract infection)     Past Surgical History:  Procedure Laterality Date   COLONOSCOPY WITH PROPOFOL N/A 07/30/2014   RMR: Colonoscopy anal canal/internal hemorrhoids, otherwise normal ileocolonoscoopy.    ESOPHAGOGASTRODUODENOSCOPY Left 09/01/2012   WUJ:[id number] esophageal erosions consistent with erosive reflux esophagitis/Pre-pyloric benign-appearing gastric ulcer with bleeding stigmata status post bleeding control therapy as described above   ESOPHAGOGASTRODUODENOSCOPY N/A 12/13/2012   NWG:[id number] hernia. PReviously noted gastric ulcer completely healed.    ESOPHAGOGASTRODUODENOSCOPY (EGD) WITH PROPOFOL N/A 07/30/2014   Procedure: ESOPHAGOGASTRODUODENOSCOPY (EGD) WITH PROPOFOL;  Surgeon: Corbin Ade, MD;  Location: [facility] ORS;  Service: Endoscopy;  Laterality: N/A;   TONSILLECTOMY      Family History  Problem Relation Age of Onset   Heart failure Mother    Colon cancer Neg Hx     Social History   Socioeconomic History   Marital status: Single    Spouse name: Not on file   Number of  children: Not on file   Years of education: Not on file   Highest education level: Not on file  Occupational History   Not on file  Tobacco Use   Smoking status: Heavy Smoker    Packs/day: 1.00    Years: 7.00    Additional pack years: 0.00    Total pack years: 7.00    Types: Cigarettes   Smokeless tobacco: Current    Types: Snuff   Tobacco comments:    daily in the evenings\  Substance and Sexual Activity   Alcohol use: No    Alcohol/week: 0.0 standard drinks of alcohol   Drug use: Yes    Types: Marijuana    Comment: if in pain   Sexual activity: Never  Other Topics Concern   Not on file  Social History Narrative   Not on file   Social Determinants of Health   Financial Resource Strain: Not on file  Food Insecurity: No Food Insecurity (06/27/2022)   Hunger Vital Sign    Worried About Running Out of Food in the Last Year: Never true    Ran Out of Food in the Last Year: Never true  Transportation Needs: No Transportation Needs (06/27/2022)   PRAPARE - Administrator, Civil Service (Medical): No    Lack of Transportation (Non-Medical): No  Physical Activity: Not on file  Stress: Not on file  Social Connections: Not on file  Intimate Partner Violence: Not At Risk (06/27/2022)   Humiliation, Afraid, Rape,  and Kick questionnaire    Fear of Current or Ex-Partner: No    Emotionally Abused: No    Physically Abused: No    Sexually Abused: No     Physical Exam   Vitals:   08/11/22 2343 08/12/22 0216  BP: (!) 134/95 (!) 140/92  Pulse: 87 86  Resp: (!) 26 16  Temp: 97.6 F (36.4 C) 97.7 F (36.5 C)  SpO2: 98% 95%    CONSTITUTIONAL: Chronically ill-appearing, NAD, obese NEURO/PSYCH:  Alert and oriented x 3, no focal deficits EYES:  eyes equal and reactive ENT/NECK:  no LAD, no JVD CARDIO: Regular rate, well-perfused, normal S1 and S2 PULM:  CTAB no wheezing or rhonchi GI/GU:  non-distended, moderate right upper quadrant tenderness to palpation MSK/SPINE:   No gross deformities, no edema SKIN:  no rash, atraumatic   *Additional and/or pertinent findings included in MDM below  Diagnostic and Interventional Summary    EKG Interpretation  Date/Time:    Ventricular Rate:    PR Interval:    QRS Duration:   QT Interval:    QTC Calculation:   R Axis:     Text Interpretation:         Labs Reviewed  COMPREHENSIVE METABOLIC PANEL - Abnormal; Notable for the following components:      Result Value   Glucose, Bld 127 (*)    Calcium 8.7 (*)    Total Protein 6.3 (*)    Albumin 3.1 (*)    AST 13 (*)    All other components within normal limits  CBC - Abnormal; Notable for the following components:   Hemoglobin 12.9 (*)    All other components within normal limits  LIPASE, BLOOD  URINALYSIS, ROUTINE W REFLEX MICROSCOPIC    CT ABDOMEN PELVIS W CONTRAST  Final Result    US Abdomen Limited RUQ (LIVER/GB)    (Results Pending)    Medications  ondansetron (ZOFRAN) injection 4 mg (4 mg Intravenous Given 08/12/22 0042)  morphine (PF) 4 MG/ML injection 4 mg (4 mg Intravenous Given 08/12/22 0042)  sodium chloride 0.9 % bolus 1,000 mL (1,000 mLs Intravenous New Bag/Given 08/12/22 0042)  iohexol (OMNIPAQUE) 300 MG/ML solution 100 mL (100 mLs Intravenous Contrast Given 08/12/22 0119)     Procedures  /  Critical Care Procedures  ED Course and Medical Decision Making  Initial Impression and Ddx Differential diagnosis includes cholecystitis (patient has known history of cholelithiasis), also considering perforated viscus given patient's history of gastritis.  Aortic pathology felt to be unlikely.  Past medical/surgical history that increases complexity of ED encounter: Cholelithiasis, obesity  Interpretation of Diagnostics I personally reviewed the laboratory assessment and my interpretation is as follows: No significant blood count or electrolyte disturbance  CT imaging revealing cholelithiasis but no emergent process  Patient Reassessment  and Ultimate Disposition/Management     Patient continues to have persistent right upper quadrant pain.  Will obtain ultrasound to further evaluate for possible cholecystitis or lodged stone in the gallbladder neck.  Signed out to oncoming provider at shift change.  Patient management required discussion with the following services or consulting groups:  None  Complexity of Problems Addressed Acute illness or injury that poses threat of life of bodily function  Additional Data Reviewed and Analyzed Further history obtained from: Prior labs/imaging results  Additional Factors Impacting ED Encounter Risk Use of parenteral controlled substances and Consideration of hospitalization  Lavar Rosenzweig. Pilar Plate, MD [facility] Emergency Medicine [facility] [email or web address]  Final Clinical Impressions(s) /  ED Diagnoses     ICD-10-CM   1. Right upper quadrant abdominal pain  R10.11       ED Discharge Orders     None        Discharge Instructions Discussed with and Provided to Patient:   Discharge Instructions   None      Sabas Sous, MD 08/12/22 [phone]

## 2022-08-12 NOTE — ED Provider Notes (Addendum)
This patient was accepted at change of shift, he is an obese male currently in a nursing facility at [facility] because of his inability to walk secondary to back issues.  He really has no other significant medical history other than being obese, history of gout, history of some gastritis and a history of unexplained neuropathy.  He presents with abdominal pain mostly right upper quadrant with pain and nausea, on exam he does have tenderness, he is morbidly obese, his labs are unremarkable as are his vital signs, CT scan showed cholelithiasis but no other complicating factors and ultrasound this morning confirms that he actually has what appears to be acute cholecystitis.  He has a positive Murphy sign, thickened gallbladder wall and will need surgical consultation.  The patient is agreeable.  He has been given a another dose of pain medication  Will consult with general surgery for admission  Discussed with Dr. Lovell Sheehan who will come to assess the patient 9:47 AM  Pt states he wants to be d/c home - and refuses to be admitted or have surgeyr at this time.    He is aware of the risk of leaving at this time.  Dr. Lovell Sheehan with general surgery is okay with him going home and recommends no antibiotics but they can follow-up if things get worse  Pt agreeable.    Final diagnoses:  Right upper quadrant abdominal pain  Lesion of adrenal gland (HCC)  Acute cholecystitis      Eber Hong, MD 08/12/22 1478    Eber Hong, MD 08/12/22 [phone]

## 2022-08-12 NOTE — Discharge Instructions (Addendum)
Your CT scan shows a lesion on your adrenal gland on your kidney which was seen in 2017 on imaging but appears larger today - this MUST be followed up by your primary care doctor - please call them this week to arrange follow up in the next week and have them acquire your results so you can discuss further investigational plans.  Your ultrasound shows that you have an inflamed gall bladder that will need to come out I spoke with the general surgeon - you know that you need surgery and you have chosen to go back to your nursing home.  If your pain worsens come back to the ER.  The [facility] ER would be preferred but you can come back here for emergency pain and worsening symptoms.

## 2022-08-12 NOTE — Consult Note (Signed)
Reason for Consult: Biliary colic, cholelithiasis Referring Physician: Dr. Hyacinth Meeker, ER  [name] is an 37 y.o. male.  HPI: Patient is a 37 year old morbidly obese white male who resides in a nursing home on campus who was brought to the emergency room for a several day history of intermittent nonspecific abdominal pain and nausea.  He states he felt like he had an upset stomach.  The abdominal pain then seem to be localized to the right upper quadrant.  Ultrasound the gallbladder reveals cholelithiasis with a thickened gallbladder wall.  CT scan results are noted.  Patient denies any fever, chills, or jaundice.  He states he had an episode of biliary colic secondary to cholelithiasis many years ago.  He otherwise has been doing well.  Past Medical History:  Diagnosis Date   Acute blood loss anemia 09/02/2012   S/p 1 unit rbcs   Cellulitis    Erosive esophagitis 09/01/2012   NSAID induced.   Gall stones    Gastric ulcer with hemorrhage 09/02/2012   s/p bleeding control tx. Per Dr. Jena Gauss   Gout    Obesity    UTI (lower urinary tract infection)     Past Surgical History:  Procedure Laterality Date   COLONOSCOPY WITH PROPOFOL N/A 07/30/2014   RMR: Colonoscopy anal canal/internal hemorrhoids, otherwise normal ileocolonoscoopy.    ESOPHAGOGASTRODUODENOSCOPY Left 09/01/2012   HQI:ONGEXB esophageal erosions consistent with erosive reflux esophagitis/Pre-pyloric benign-appearing gastric ulcer with bleeding stigmata status post bleeding control therapy as described above   ESOPHAGOGASTRODUODENOSCOPY N/A 12/13/2012   [id number] hernia. PReviously noted gastric ulcer completely healed.    ESOPHAGOGASTRODUODENOSCOPY (EGD) WITH PROPOFOL N/A 07/30/2014   Procedure: ESOPHAGOGASTRODUODENOSCOPY (EGD) WITH PROPOFOL;  Surgeon: Corbin Ade, MD;  Location: [facility];  Service: Endoscopy;  Laterality: N/A;   TONSILLECTOMY      Family History  Problem Relation Age of Onset   Heart failure Mother     Colon cancer Neg Hx     Social History:  reports that he has been smoking cigarettes. He has a 7.00 pack-year smoking history. His smokeless tobacco use includes snuff. He reports current drug use. Drug: Marijuana. He reports that he does not drink alcohol.  Allergies:  Allergies  Allergen Reactions   Asa [Aspirin] Other (See Comments)    Pt doesn't know reaction.   Clindamycin/Lincomycin     Gastric ulcer     Nsaids Other (See Comments)    Causes Ulcers    Penicillins Other (See Comments)    Pt doesn't know reaction. Has patient had a PCN reaction causing immediate rash, facial/tongue/throat swelling, SOB or lightheadedness with hypotension: No Has patient had a PCN reaction causing severe rash involving mucus membranes or skin necrosis: No Has patient had a PCN reaction that required hospitalization No Has patient had a PCN reaction occurring within the last 10 years: No If all of the above answers are "NO", then may proceed with Cephalosporin use.    Sulfa Antibiotics Other (See Comments)    Pt doesn't know reaction.    Medications: Prior to Admission: (Not in a hospital admission)   Results for orders placed or performed during the hospital encounter of 08/11/22 (from the past 48 hour(s))  Lipase, blood     Status: None   Collection Time: 08/12/22 12:06 AM  Result Value Ref Range   Lipase 35 11 - 51 U/L    Comment: Performed at [facility], [address], [place], [place] [address]  Comprehensive metabolic panel  Status: Abnormal   Collection Time: 08/12/22 12:06 AM  Result Value Ref Range   Sodium 140 135 - 145 mmol/L   Potassium 3.5 3.5 - 5.1 mmol/L   Chloride 103 98 - 111 mmol/L   CO2 29 22 - 32 mmol/L   Glucose, Bld 127 (H) 70 - 99 mg/dL    Comment: Glucose reference range applies only to samples taken after fasting for at least 8 hours.   BUN 18 6 - 20 mg/dL   Creatinine, Ser 8.11 0.61 - 1.24 mg/dL   Calcium 8.7 (L) 8.9 - 10.3 mg/dL   Total Protein 6.3 (L)  6.5 - 8.1 g/dL   Albumin 3.1 (L) 3.5 - 5.0 g/dL   AST 13 (L) 15 - 41 U/L   ALT 20 0 - 44 U/L   Alkaline Phosphatase 54 38 - 126 U/L   Total Bilirubin 0.6 0.3 - 1.2 mg/dL   GFR, Estimated >91 >47 mL/min    Comment: (NOTE) Calculated using the CKD-EPI Creatinine Equation (2021)    Anion gap 8 5 - 15    Comment: Performed at [facility], [address] [place], [place] [address]  CBC     Status: Abnormal   Collection Time: 08/12/22 12:06 AM  Result Value Ref Range   WBC 8.4 4.0 - 10.5 K/uL   RBC 4.43 4.22 - 5.81 MIL/uL   Hemoglobin 12.9 (L) 13.0 - 17.0 g/dL   HCT 21.3 08.6 - 57.8 %   MCV 93.5 80.0 - 100.0 fL   MCH 29.1 26.0 - 34.0 pg   MCHC 31.2 30.0 - 36.0 g/dL   RDW 46.9 62.9 - 52.8 %   Platelets 245 150 - 400 K/uL   nRBC 0.0 0.0 - 0.2 %    Comment: Performed at [facility], [address], [place], [place] [address]  Urinalysis, Routine w reflex microscopic -Urine, Clean Catch     Status: None   Collection Time: 08/12/22  2:10 AM  Result Value Ref Range   Color, Urine YELLOW YELLOW   APPearance CLEAR CLEAR   Specific Gravity, Urine 1.015 1.005 - 1.030   pH 5.0 5.0 - 8.0   Glucose, UA NEGATIVE NEGATIVE mg/dL   Hgb urine dipstick NEGATIVE NEGATIVE   Bilirubin Urine NEGATIVE NEGATIVE   Ketones, ur NEGATIVE NEGATIVE mg/dL   Protein, ur NEGATIVE NEGATIVE mg/dL   Nitrite NEGATIVE NEGATIVE   Leukocytes,Ua NEGATIVE NEGATIVE    Comment: Performed at [facility], [address], [place], [place] [address]    US Abdomen Limited RUQ (LIVER/GB)  Result Date: 08/12/2022 CLINICAL DATA:  Right upper quadrant and epigastric pain. EXAM: ULTRASOUND ABDOMEN LIMITED RIGHT UPPER QUADRANT COMPARISON:  CT scan from earlier today. FINDINGS: Gallbladder: Gallbladder wall thickness measured 5 mm. Calcified stone identified, as noted on CT scan earlier measuring about 3 cm diameter. Sonographer reports a positive sonographic Murphy sign. No substantial pericholecystic fluid. Common bile duct:  Diameter: 5 mm Liver: No focal lesion identified. Liver parenchyma appears echogenic suggesting steatosis. Portal vein is patent on color Doppler imaging with normal direction of blood flow towards the liver. Other: None. IMPRESSION: 1. Cholelithiasis with gallbladder wall thickening and positive sonographic Murphy sign. Findings suggest acute cholecystitis. 2. No biliary dilatation. 3. Probable hepatic steatosis. Electronically Signed   By: Kennith Center M.D.   On: 08/12/2022 09:09   CT ABDOMEN PELVIS W CONTRAST  Result Date: 08/12/2022 CLINICAL DATA:  Right upper quadrant abdominal pain. EXAM: CT ABDOMEN AND PELVIS WITH CONTRAST TECHNIQUE: Multidetector  CT imaging of the abdomen and pelvis was performed using the standard protocol following bolus administration of intravenous contrast. RADIATION DOSE REDUCTION: This exam was performed according to the departmental dose-optimization program which includes automated exposure control, adjustment of the mA and/or kV according to patient size and/or use of iterative reconstruction technique. CONTRAST:  OMNIPAQUE IOHEXOL 300 MG/ML  SOLN COMPARISON:  10/11/2015. FINDINGS: Lower chest: No acute abnormality. A 5 mm nodule is present in the left lower lobe, axial image 5, unchanged from 2017 and likely benign. Hepatobiliary: No focal liver abnormality is seen. A stone is present within the gallbladder. No biliary ductal dilatation. Pancreas: Unremarkable. No pancreatic ductal dilatation or surrounding inflammatory changes. Spleen: Normal in size without focal abnormality. Adrenals/Urinary Tract: The right adrenal gland is within normal limits. There is a 3.4 cm left adrenal nodule, increased in size from 2017. The right adrenal gland is within normal limits. The kidneys enhance symmetrically. No renal calculus or hydronephrosis. Bladder is unremarkable. Stomach/Bowel: Stomach is within normal limits. Appendix appears normal. No evidence of bowel wall thickening,  distention, or inflammatory changes. No free air or pneumatosis. A few scattered diverticula are present along the colon without evidence of diverticulitis. Vascular/Lymphatic: No significant vascular findings are present. No enlarged abdominal or pelvic lymph nodes. Reproductive: Prostate is unremarkable. Other: No abdominopelvic ascites. Musculoskeletal: Degenerative changes are present in the thoracolumbar spine. No acute osseous abnormality. IMPRESSION: 1. No acute intra-abdominal process. 2. Cholelithiasis. 3. 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 Multiphase CT or MRI with adrenal protocol is recommended for further evaluation on nonemergent follow-up. 4. Diverticulosis without diverticulitis. Electronically Signed   By: Thornell Sartorius M.D.   On: 08/12/2022 01:42    ROS:  Pertinent items are noted in HPI.  Blood pressure 121/82, pulse 82, temperature 97.8 F (36.6 C), temperature source Oral, resp. rate 18, height 5\' 7"  (1.702 m), weight (!) 181.4 kg, SpO2 92 %. Physical Exam: Super morbidly obese white male in no acute distress Head is normocephalic, atraumatic Eyes are without scleral icterus Lungs clear to auscultation with equal breath sounds bilaterally Heart examination reveals a regular rate rhythm without S3, S4, murmurs Abdomen is soft with discomfort to palpation in the right upper quadrant but there is no rigidity noted.  CT scan and ultrasound reports reviewed.  Labs reviewed.  Assessment/Plan: Impression: Biliary colic secondary to cholelithiasis.  This appears to be his second episode.  He clinically does not have evidence of acute cholecystitis requiring urgent surgical treatment.  Should he require surgical intervention, this would have to be done at [facility] long in [place] due to his super morbid obesity state.  He does realize he is at increased risk for any surgical intervention.  He has been told that in the  past.  He would like to go back to the nursing home to see if this resolves on its own.  That is fine with me.  Should he worsen, he should be return to the emergency room here at any [facility] or down in [place].  No need for antibiotics at the present time.  Discussed with Dr. Hyacinth Meeker.  Franky Macho 08/12/2022, 10:22 AM

## 2022-08-12 NOTE — ED Notes (Signed)
[facility] at bedside for transport back to facility

## 2022-08-14 ENCOUNTER — Telehealth: Payer: Self-pay | Admitting: Radiology

## 2022-08-14 NOTE — Telephone Encounter (Signed)
Let the patient know   And tell him to go to ER if still having problem

## 2022-08-14 NOTE — Telephone Encounter (Signed)
08/14/2022 07:53AM Your request was not approved. Please refer to the letter we sent you via email, fax or [employer] for next steps.   The CT hip was denied they indicate letter was sent, I have not received a letter so unsure why it was denied.

## 2022-08-16 NOTE — Telephone Encounter (Signed)
I did call him to advise  Noticed he had CT Abdomen and Pelvis in system since last visit and they did not note anything about his hip   His MRI has not been sent for approval yet, I have advised him we hopefully will know something soon  To you FYI

## 2022-08-17 ENCOUNTER — Other Ambulatory Visit: Payer: Self-pay

## 2022-08-17 ENCOUNTER — Encounter (HOSPITAL_COMMUNITY): Payer: Self-pay | Admitting: Emergency Medicine

## 2022-08-17 ENCOUNTER — Observation Stay (HOSPITAL_COMMUNITY)
Admission: EM | Admit: 2022-08-17 | Discharge: 2022-08-18 | Disposition: A | Discharge status: Skilled Nursing Facility | Payer: Medicaid Other | Attending: Internal Medicine | Admitting: Internal Medicine

## 2022-08-17 DIAGNOSIS — K219 Gastro-esophageal reflux disease without esophagitis: Secondary | ICD-10-CM | POA: Diagnosis not present

## 2022-08-17 DIAGNOSIS — F17210 Nicotine dependence, cigarettes, uncomplicated: Secondary | ICD-10-CM | POA: Diagnosis not present

## 2022-08-17 DIAGNOSIS — Z79899 Other long term (current) drug therapy: Secondary | ICD-10-CM | POA: Diagnosis not present

## 2022-08-17 DIAGNOSIS — R1011 Right upper quadrant pain: Secondary | ICD-10-CM

## 2022-08-17 DIAGNOSIS — K625 Hemorrhage of anus and rectum: Secondary | ICD-10-CM

## 2022-08-17 DIAGNOSIS — R109 Unspecified abdominal pain: Secondary | ICD-10-CM | POA: Diagnosis present

## 2022-08-17 DIAGNOSIS — M109 Gout, unspecified: Secondary | ICD-10-CM | POA: Diagnosis present

## 2022-08-17 DIAGNOSIS — K922 Gastrointestinal hemorrhage, unspecified: Secondary | ICD-10-CM | POA: Diagnosis not present

## 2022-08-17 DIAGNOSIS — R110 Nausea: Secondary | ICD-10-CM | POA: Diagnosis not present

## 2022-08-17 DIAGNOSIS — M1000 Idiopathic gout, unspecified site: Secondary | ICD-10-CM

## 2022-08-17 DIAGNOSIS — Z6841 Body Mass Index (BMI) 40.0 and over, adult: Secondary | ICD-10-CM | POA: Diagnosis not present

## 2022-08-17 LAB — COMPREHENSIVE METABOLIC PANEL
ALT: 20 U/L (ref 0–44)
AST: 13 U/L — ABNORMAL LOW (ref 15–41)
Albumin: 3.2 g/dL — ABNORMAL LOW (ref 3.5–5.0)
Alkaline Phosphatase: 52 U/L (ref 38–126)
Anion gap: 9 (ref 5–15)
BUN: 13 mg/dL (ref 6–20)
CO2: 28 mmol/L (ref 22–32)
Calcium: 8.5 mg/dL — ABNORMAL LOW (ref 8.9–10.3)
Chloride: 101 mmol/L (ref 98–111)
Creatinine, Ser: 0.65 mg/dL (ref 0.61–1.24)
GFR, Estimated: >60 mL/min (ref >60)
Glucose, Bld: 96 mg/dL (ref 70–99)
Potassium: 3.6 mmol/L (ref 3.5–5.1)
Sodium: 138 mmol/L (ref 135–145)
Total Bilirubin: 0.5 mg/dL (ref 0.3–1.2)
Total Protein: 6.1 g/dL — ABNORMAL LOW (ref 6.5–8.1)

## 2022-08-17 LAB — CBC WITH DIFFERENTIAL/PLATELET
Abs Immature Granulocytes: 0.01 10*3/uL (ref 0.00–0.07)
Basophils Absolute: 0 10*3/uL (ref 0.0–0.1)
Basophils Relative: 0 %
Eosinophils Absolute: 0.2 10*3/uL (ref 0.0–0.5)
Eosinophils Relative: 4 %
HCT: 40.5 % (ref 39.0–52.0)
Hemoglobin: 13.1 g/dL (ref 13.0–17.0)
Immature Granulocytes: 0 %
Lymphocytes Relative: 31 %
Lymphs Abs: 1.9 10*3/uL (ref 0.7–4.0)
MCH: 29.4 pg (ref 26.0–34.0)
MCHC: 32.3 g/dL (ref 30.0–36.0)
MCV: 90.8 fL (ref 80.0–100.0)
Monocytes Absolute: 0.5 10*3/uL (ref 0.1–1.0)
Monocytes Relative: 8 %
Neutro Abs: 3.5 10*3/uL (ref 1.7–7.7)
Neutrophils Relative %: 57 %
Platelets: 219 10*3/uL (ref 150–400)
RBC: 4.46 MIL/uL (ref 4.22–5.81)
RDW: 14.1 % (ref 11.5–15.5)
WBC: 6.2 10*3/uL (ref 4.0–10.5)
nRBC: 0 % (ref 0.0–0.2)

## 2022-08-17 LAB — TYPE AND SCREEN
ABO/RH(D): A POS
Antibody Screen: NEGATIVE

## 2022-08-17 LAB — LIPASE, BLOOD: Lipase: 28 U/L (ref 11–51)

## 2022-08-17 LAB — POC OCCULT BLOOD, ED: Fecal Occult Bld: POSITIVE — AB

## 2022-08-17 MED ORDER — ACETAMINOPHEN 325 MG PO TABS
650.0000 mg | ORAL_TABLET | Freq: Once | ORAL | Status: AC
  Administered 2022-08-17: 650 mg via ORAL
  Filled 2022-08-17: qty 2

## 2022-08-17 MED ORDER — PANTOPRAZOLE SODIUM 40 MG IV SOLR
40.0000 mg | Freq: Once | INTRAVENOUS | Status: AC
  Administered 2022-08-17: 40 mg via INTRAVENOUS
  Filled 2022-08-17: qty 10

## 2022-08-17 MED ORDER — ALLOPURINOL 100 MG PO TABS
100.0000 mg | ORAL_TABLET | Freq: Every day | ORAL | Status: DC
  Filled 2022-08-17: qty 1

## 2022-08-17 MED ORDER — PANTOPRAZOLE SODIUM 40 MG IV SOLR
40.0000 mg | Freq: Two times a day (BID) | INTRAVENOUS | Status: DC
  Administered 2022-08-18: 40 mg via INTRAVENOUS
  Filled 2022-08-17: qty 10

## 2022-08-17 MED ORDER — MORPHINE SULFATE (PF) 2 MG/ML IV SOLN
2.0000 mg | INTRAVENOUS | Status: DC | PRN
  Administered 2022-08-17 – 2022-08-18 (×5): 2 mg via INTRAVENOUS
  Filled 2022-08-17 (×5): qty 1

## 2022-08-17 MED ORDER — ACETAMINOPHEN 650 MG RE SUPP: 650.0000 mg | Freq: Four times a day (QID) | RECTAL | Status: DC | PRN

## 2022-08-17 MED ORDER — ACETAMINOPHEN 325 MG PO TABS
650.0000 mg | ORAL_TABLET | Freq: Four times a day (QID) | ORAL | Status: DC | PRN
  Administered 2022-08-18: 650 mg via ORAL
  Filled 2022-08-17: qty 2

## 2022-08-17 MED ORDER — ONDANSETRON HCL 4 MG/2ML IJ SOLN: 4.0000 mg | Freq: Four times a day (QID) | INTRAMUSCULAR | Status: DC | PRN

## 2022-08-17 MED ORDER — ONDANSETRON HCL 4 MG PO TABS: 4.0000 mg | ORAL_TABLET | Freq: Four times a day (QID) | ORAL | Status: DC | PRN

## 2022-08-17 MED ORDER — SODIUM CHLORIDE 0.9 % IV BOLUS
1000.0000 mL | Freq: Once | INTRAVENOUS | Status: AC
  Administered 2022-08-17: 1000 mL via INTRAVENOUS

## 2022-08-17 NOTE — ED Notes (Signed)
ED TO INPATIENT HANDOFF REPORT  ED Nurse Name and Phone #:  Jeanella Anton [phone]  S Name/Age/Gender [name] 37 y.o. male Room/Bed: APA02/APA02  Code Status   Code Status: Full Code  Home/SNF/Other Skilled nursing facility Patient oriented to: self, place, time, and situation Is this baseline? Yes   Triage Complete: Triage complete  Chief Complaint GI bleed [K92.2]  Triage Note Pt c/o back/abd pain and rectal bleeding.    Allergies Allergies  Allergen Reactions   Asa [Aspirin] Other (See Comments)    Pt doesn't know reaction.   Clindamycin/Lincomycin     Gastric ulcer     Nsaids Other (See Comments)    Causes Ulcers    Penicillins Other (See Comments)    Pt doesn't know reaction. Has patient had a PCN reaction causing immediate rash, facial/tongue/throat swelling, SOB or lightheadedness with hypotension: No Has patient had a PCN reaction causing severe rash involving mucus membranes or skin necrosis: No Has patient had a PCN reaction that required hospitalization No Has patient had a PCN reaction occurring within the last 10 years: No If all of the above answers are "NO", then may proceed with Cephalosporin use.    Sulfa Antibiotics Other (See Comments)    Pt doesn't know reaction.    Level of Care/Admitting Diagnosis ED Disposition     ED Disposition  Admit   Condition  --   Comment  Hospital Area: [facility] [[id number]]  Level of Care: Med-Surg [16]  Covid Evaluation: Asymptomatic - no recent exposure (last 10 days) testing not required  Diagnosis: GI bleed [248157]  Admitting Physician: Frankey Shown [[id number]]  Attending Physician: Frankey Shown [[id number]]          B Medical/Surgery History Past Medical History:  Diagnosis Date   Acute blood loss anemia 09/02/2012   S/p 1 unit rbcs   Cellulitis    Erosive esophagitis 09/01/2012   NSAID induced.   Gall stones    Gastric ulcer with hemorrhage 09/02/2012   s/p bleeding control  tx. Per Dr. Jena Gauss   Gout    Obesity    UTI (lower urinary tract infection)    Past Surgical History:  Procedure Laterality Date   COLONOSCOPY WITH PROPOFOL N/A 07/30/2014   RMR: Colonoscopy anal canal/internal hemorrhoids, otherwise normal ileocolonoscoopy.    ESOPHAGOGASTRODUODENOSCOPY Left 09/01/2012   NFA:OZHYQM esophageal erosions consistent with erosive reflux esophagitis/Pre-pyloric benign-appearing gastric ulcer with bleeding stigmata status post bleeding control therapy as described above   ESOPHAGOGASTRODUODENOSCOPY N/A 12/13/2012   VHQ:IONGEX hernia. PReviously noted gastric ulcer completely healed.    ESOPHAGOGASTRODUODENOSCOPY (EGD) WITH PROPOFOL N/A 07/30/2014   Procedure: ESOPHAGOGASTRODUODENOSCOPY (EGD) WITH PROPOFOL;  Surgeon: Corbin Ade, MD;  Location: [facility];  Service: Endoscopy;  Laterality: N/A;   TONSILLECTOMY       A IV Location/Drains/Wounds Patient Lines/Drains/Airways Status     Active Line/Drains/Airways     Name Placement date Placement time Site Days   Peripheral IV 08/17/22 20 G 1" Anterior;Right Forearm 08/17/22  1555  Forearm  less than 1            Intake/Output Last 24 hours No intake or output data in the 24 hours ending 08/17/22 1955  Labs/Imaging Results for orders placed or performed during the hospital encounter of 08/17/22 (from the past 48 hour(s))  Type and screen     Status: None   Collection Time: 08/17/22  4:05 PM  Result Value Ref Range   ABO/RH(D) A POS    Antibody  Screen NEG    Sample Expiration      08/20/2022,2359 Performed at [facility], [address], [place], [place] [address]   CBC with Differential     Status: None   Collection Time: 08/17/22  4:05 PM  Result Value Ref Range   WBC 6.2 4.0 - 10.5 K/uL   RBC 4.46 4.22 - 5.81 MIL/uL   Hemoglobin 13.1 13.0 - 17.0 g/dL   HCT 60.4 54.0 - 98.1 %   MCV 90.8 80.0 - 100.0 fL   MCH 29.4 26.0 - 34.0 pg   MCHC 32.3 30.0 - 36.0 g/dL   RDW 19.1 47.8 - 29.5 %    Platelets 219 150 - 400 K/uL   nRBC 0.0 0.0 - 0.2 %   Neutrophils Relative % 57 %   Neutro Abs 3.5 1.7 - 7.7 K/uL   Lymphocytes Relative 31 %   Lymphs Abs 1.9 0.7 - 4.0 K/uL   Monocytes Relative 8 %   Monocytes Absolute 0.5 0.1 - 1.0 K/uL   Eosinophils Relative 4 %   Eosinophils Absolute 0.2 0.0 - 0.5 K/uL   Basophils Relative 0 %   Basophils Absolute 0.0 0.0 - 0.1 K/uL   Immature Granulocytes 0 %   Abs Immature Granulocytes 0.01 0.00 - 0.07 K/uL    Comment: Performed at [facility], [address] [place], [place] [address]  Comprehensive metabolic panel     Status: Abnormal   Collection Time: 08/17/22  4:05 PM  Result Value Ref Range   Sodium 138 135 - 145 mmol/L   Potassium 3.6 3.5 - 5.1 mmol/L   Chloride 101 98 - 111 mmol/L   CO2 28 22 - 32 mmol/L   Glucose, Bld 96 70 - 99 mg/dL    Comment: Glucose reference range applies only to samples taken after fasting for at least 8 hours.   BUN 13 6 - 20 mg/dL   Creatinine, Ser 8.65 0.61 - 1.24 mg/dL   Calcium 8.5 (L) 8.9 - 10.3 mg/dL   Total Protein 6.1 (L) 6.5 - 8.1 g/dL   Albumin 3.2 (L) 3.5 - 5.0 g/dL   AST 13 (L) 15 - 41 U/L   ALT 20 0 - 44 U/L   Alkaline Phosphatase 52 38 - 126 U/L   Total Bilirubin 0.5 0.3 - 1.2 mg/dL   GFR, Estimated >78 >46 mL/min    Comment: (NOTE) Calculated using the CKD-EPI Creatinine Equation (2021)    Anion gap 9 5 - 15    Comment: Performed at [facility], [address] [place], [place] [address]  Lipase, blood     Status: None   Collection Time: 08/17/22  4:05 PM  Result Value Ref Range   Lipase 28 11 - 51 U/L    Comment: Performed at [facility] [address], [place], [place] [address]  POC occult blood, ED     Status: Abnormal   Collection Time: 08/17/22  6:04 PM  Result Value Ref Range   Fecal Occult Bld POSITIVE (A) NEGATIVE   No results found.  Pending Labs Unresulted Labs (From admission, onward)     Start     Ordered   08/18/22 0500  Comprehensive metabolic panel   Tomorrow morning,   R        08/17/22 1953   08/18/22 0500  CBC  Tomorrow morning,   R        08/17/22 1953   08/18/22 0500  Magnesium  Tomorrow morning,   R  08/17/22 1953   08/18/22 0500  Phosphorus  Tomorrow morning,   R        08/17/22 1953            Vitals/Pain Today's Vitals   08/17/22 1730 08/17/22 1756 08/17/22 1800 08/17/22 1900  BP: 117/61  127/80 118/83  Pulse: 71  70 73  Resp:    19  Temp:      SpO2:   92% 93%  Weight:      Height:      PainSc:  4       Isolation Precautions No active isolations  Medications Medications  acetaminophen (TYLENOL) tablet 650 mg (has no administration in time range)    Or  acetaminophen (TYLENOL) suppository 650 mg (has no administration in time range)  ondansetron (ZOFRAN) tablet 4 mg (has no administration in time range)    Or  ondansetron (ZOFRAN) injection 4 mg (has no administration in time range)  pantoprazole (PROTONIX) injection 40 mg (has no administration in time range)  sodium chloride 0.9 % bolus 1,000 mL (1,000 mLs Intravenous Bolus 08/17/22 1556)  pantoprazole (PROTONIX) injection 40 mg (40 mg Intravenous Given 08/17/22 1557)  acetaminophen (TYLENOL) tablet 650 mg (650 mg Oral Given 08/17/22 1627)    Mobility walks with person assist/wheelchair     Focused Assessments    R Recommendations: See Admitting Provider Note  Report given to:   Additional Notes:  RA

## 2022-08-17 NOTE — ED Triage Notes (Signed)
Pt c/o back/abd pain and rectal bleeding.

## 2022-08-17 NOTE — ED Notes (Signed)
POC occult blood was positive--MD made aware

## 2022-08-17 NOTE — H&P (Signed)
History and Physical    Patient: [name]:[id number] DOB: 03/05/1986 DOA: 08/17/2022 DOS: the patient was seen and examined on 08/17/2022 PCP: Toma Deiters, MD  Patient coming from: SNF  Chief Complaint:  Chief Complaint  Patient presents with   Rectal Bleeding   HPI: [name] is a 37 y.o. male with medical history significant of gout, NSAID induced PUD, osteoarthritis, biliary colic secondary to cholelithiasis, morbid obesity who presents to the emergency department due to 1 day onset of rectal bleeding.  Patient complains of 3 episodes of rectal bleed yesterday and 2 episodes today, so he presented to the ED for further evaluation and management. He presented to the emergency department on 4/27 due to abdominal pain and nausea, he was noted to have biliary colic secondary to cholelithiasis by general surgery (Dr. Lovell Sheehan) and recommended that patient will need to go to [facility] in [place] due to super morbid obesity states should he require surgery.  Patient states that he continues to have abdominal pain with radiation to right side of back and occasional nausea.  Pain was rated as 8/10 on pain scale without any known aggravating/alleviating factors.  Patient denies use of NSAIDs, BC powder or Goody powders.   ED Course:  In the emergency department, he was hemodynamically stable.  Workup in the ED showed normal CBC and BMP, albumin 3.2, FOBT was positive.  Gastroenterologist was consulted and recommended admitting patient with plan to see patient in the morning.  Tylenol 650 mg p.o. x 1 was given, IV Zofran 4 mg x 1 was given and IV hydration of 1 L NS was given.  Hospitalist was asked to admit patient for further evaluation and management.  Review of Systems: Review of systems as noted in the HPI. All other systems reviewed and are negative.   Past Medical History:  Diagnosis Date   Acute blood loss anemia 09/02/2012   S/p 1 unit rbcs   Cellulitis     Erosive esophagitis 09/01/2012   NSAID induced.   Gall stones    Gastric ulcer with hemorrhage 09/02/2012   s/p bleeding control tx. Per Dr. Jena Gauss   Gout    Obesity    UTI (lower urinary tract infection)    Past Surgical History:  Procedure Laterality Date   COLONOSCOPY WITH PROPOFOL N/A 07/30/2014   RMR: Colonoscopy anal canal/internal hemorrhoids, otherwise normal ileocolonoscoopy.    ESOPHAGOGASTRODUODENOSCOPY Left 09/01/2012   WJX:BJYNWG esophageal erosions consistent with erosive reflux esophagitis/Pre-pyloric benign-appearing gastric ulcer with bleeding stigmata status post bleeding control therapy as described above   ESOPHAGOGASTRODUODENOSCOPY N/A 12/13/2012   NFA:OZHYQM hernia. PReviously noted gastric ulcer completely healed.    ESOPHAGOGASTRODUODENOSCOPY (EGD) WITH PROPOFOL N/A 07/30/2014   Procedure: ESOPHAGOGASTRODUODENOSCOPY (EGD) WITH PROPOFOL;  Surgeon: Corbin Ade, MD;  Location: [facility];  Service: Endoscopy;  Laterality: N/A;   TONSILLECTOMY      Social History:  reports that he has been smoking cigarettes. He has a 7.00 pack-year smoking history. His smokeless tobacco use includes snuff. He reports current drug use. Drug: Marijuana. He reports that he does not drink alcohol.   Allergies  Allergen Reactions   Asa [Aspirin] Other (See Comments)    Pt doesn't know reaction.   Clindamycin/Lincomycin     Gastric ulcer     Nsaids Other (See Comments)    Causes Ulcers    Penicillins Other (See Comments)    Pt doesn't know reaction. Has patient had a PCN reaction causing immediate rash, facial/tongue/throat  swelling, SOB or lightheadedness with hypotension: No Has patient had a PCN reaction causing severe rash involving mucus membranes or skin necrosis: No Has patient had a PCN reaction that required hospitalization No Has patient had a PCN reaction occurring within the last 10 years: No If all of the above answers are "NO", then may proceed with Cephalosporin use.     Sulfa Antibiotics Other (See Comments)    Pt doesn't know reaction.    Family History  Problem Relation Age of Onset   Heart failure Mother    Colon cancer Neg Hx      Prior to Admission medications   Medication Sig Start Date End Date Taking? Authorizing Provider  acetaminophen (TYLENOL) 325 MG tablet Take 2 tablets (650 mg total) by mouth every 6 (six) hours as needed for mild pain. 07/01/22   Tyrone Nine, MD  allopurinol (ZYLOPRIM) 100 MG tablet Take 1 tablet (100 mg total) by mouth daily. 07/02/22   Tyrone Nine, MD  gabapentin (NEURONTIN) 300 MG capsule Take 1 capsule (300 mg total) by mouth 4 (four) times daily. 07/01/22   Tyrone Nine, MD  ondansetron (ZOFRAN-ODT) 4 MG disintegrating tablet Take 1 tablet (4 mg total) by mouth every 8 (eight) hours as needed for nausea. 08/12/22   Eber Hong, MD  pantoprazole (PROTONIX) 40 MG tablet Take 1 tablet (40 mg total) by mouth 2 (two) times daily before a meal. 07/01/22   Tyrone Nine, MD  PERCOCET 10-325 MG tablet Take 1 tablet by mouth every 4 (four) hours as needed. 07/06/22   [provider]    Physical Exam: BP 118/83 (BP Location: Right Arm)   Pulse 73   Temp 98.1 F (36.7 C)   Resp 19   Ht 5\' 7"  (1.702 m)   Wt (!) 190.5 kg   SpO2 93%   BMI 65.78 kg/m   General: 37 y.o. year-old male well developed well nourished in no acute distress.  Alert and oriented x3. HEENT: NCAT, EOMI Neck: Supple, trachea medial Cardiovascular: Regular rate and rhythm with no rubs or gallops.  No thyromegaly or JVD noted.  No lower extremity edema. 2/4 pulses in all 4 extremities. Respiratory: Clear to auscultation with no wheezes or rales. Good inspiratory effort. Abdomen: Soft, nontender nondistended with normal bowel sounds x4 quadrants. Muskuloskeletal: No cyanosis, clubbing or edema noted bilaterally Neuro: CN II-XII intact, strength 5/5 x 4, sensation, reflexes intact Skin: No ulcerative lesions noted or rashes Psychiatry:  Judgement and insight appear normal. Mood is appropriate for condition and setting          Labs on Admission:  Basic Metabolic Panel: Recent Labs  Lab 08/12/22 0006 08/17/22 1605  NA 140 138  K 3.5 3.6  CL 103 101  CO2 29 28  GLUCOSE 127* 96  BUN 18 13  CREATININE 0.64 0.65  CALCIUM 8.7* 8.5*   Liver Function Tests: Recent Labs  Lab 08/12/22 0006 08/17/22 1605  AST 13* 13*  ALT 20 20  ALKPHOS 54 52  BILITOT 0.6 0.5  PROT 6.3* 6.1*  ALBUMIN 3.1* 3.2*   Recent Labs  Lab 08/12/22 0006 08/17/22 1605  LIPASE 35 28   No results for input(s): "AMMONIA" in the last 168 hours. CBC: Recent Labs  Lab 08/12/22 0006 08/17/22 1605  WBC 8.4 6.2  NEUTROABS  --  3.5  HGB 12.9* 13.1  HCT 41.4 40.5  MCV 93.5 90.8  PLT 245 219   Cardiac Enzymes: No results for  input(s): "CKTOTAL", "CKMB", "CKMBINDEX", "TROPONINI" in the last 168 hours.  BNP (last 3 results) No results for input(s): "BNP" in the last 8760 hours.  ProBNP (last 3 results) No results for input(s): "PROBNP" in the last 8760 hours.  CBG: No results for input(s): "GLUCAP" in the last 168 hours.  Radiological Exams on Admission: No results found.  EKG: I independently viewed the EKG done and my findings are as followed: EKG was not done in the ED  Assessment/Plan Present on Admission:  GI bleed  Abdominal pain  Morbid obesity (HCC)  Gout  Principal Problem:   GI bleed Active Problems:   Morbid obesity (HCC)   Abdominal pain   Gout   Nausea   GERD (gastroesophageal reflux disease)  GI bleed H/H= 13.1/40.5, this was 12.9/41.4 on 08/12/2022 Hemoccult was positive No indication for blood transfusion at this time Continue IV Protonix 40 mg twice daily Continue clear liquid diet Gastroenterologist was already consulted and will see patient in the morning per EDP  Abdominal pain and nausea Patient has a history of biliary colic secondary to cholelithiasis RUQ ultrasound done 08/12/2022 showed  cholelithiasis with gallbladder wall thickening CT abdomen and pelvis with contrast done on 4/27 showed cholelithiasis Continue IV morphine 2 mg q.4h p.r.n. for moderate to severe pain Continue IV Zofran p.r.n. Continue clear liquid diet  Consider surgical consult if abdominal pain continues to worsen  GERD Continue IV Protonix  Gout Continue allopurinol  Morbid obesity (BMI 65.78) Continue diet and lifestyle modification Patient may require bariatric surgery  DVT prophylaxis: SCDs  Advance Care Planning: Full code  Consults: Gastroenterology  Family Communication: None at bedside  Severity of Illness: The appropriate patient status for this patient is OBSERVATION. Observation status is judged to be reasonable and necessary in order to provide the required intensity of service to ensure the patient's safety. The patient's presenting symptoms, physical exam findings, and initial radiographic and laboratory data in the context of their medical condition is felt to place them at decreased risk for further clinical deterioration. Furthermore, it is anticipated that the patient will be medically stable for discharge from the hospital within 2 midnights of admission.   Author: Frankey Shown, DO 08/17/2022 8:41 PM  For on call review [email or web address].

## 2022-08-17 NOTE — ED Provider Notes (Signed)
[place] EMERGENCY DEPARTMENT AT [facility] Provider Note   CSN: [id number] Arrival date & time: 08/17/22  1509     History  Chief Complaint  Patient presents with   Rectal Bleeding    [name] is a 37 y.o. male.  Patient complains of abdominal pain and rectal bleeding.  He had 3 episodes yesterday and 2 today.  Patient has a history of obesity and gallbladder  The history is provided by the patient and medical records. No language interpreter was used.  Rectal Bleeding Quality:  Bright red Amount:  Moderate Timing:  Constant Chronicity:  New Context: not anal fissures   Similar prior episodes: no   Relieved by:  Nothing Ineffective treatments:  None tried Associated symptoms: no abdominal pain        Home Medications Prior to Admission medications   Medication Sig Start Date End Date Taking? Authorizing Provider  acetaminophen (TYLENOL) 325 MG tablet Take 2 tablets (650 mg total) by mouth every 6 (six) hours as needed for mild pain. 07/01/22   Tyrone Nine, MD  allopurinol (ZYLOPRIM) 100 MG tablet Take 1 tablet (100 mg total) by mouth daily. 07/02/22   Tyrone Nine, MD  gabapentin (NEURONTIN) 300 MG capsule Take 1 capsule (300 mg total) by mouth 4 (four) times daily. 07/01/22   Tyrone Nine, MD  ondansetron (ZOFRAN-ODT) 4 MG disintegrating tablet Take 1 tablet (4 mg total) by mouth every 8 (eight) hours as needed for nausea. 08/12/22   Eber Hong, MD  pantoprazole (PROTONIX) 40 MG tablet Take 1 tablet (40 mg total) by mouth 2 (two) times daily before a meal. 07/01/22   Tyrone Nine, MD  PERCOCET 10-325 MG tablet Take 1 tablet by mouth every 4 (four) hours as needed. 07/06/22   [provider]      Allergies    Asa [aspirin], Clindamycin/lincomycin, Nsaids, Penicillins, and Sulfa antibiotics    Review of Systems   Review of Systems  Constitutional:  Negative for appetite change and fatigue.  HENT:  Negative for congestion, ear  discharge and sinus pressure.   Eyes:  Negative for discharge.  Respiratory:  Negative for cough.   Cardiovascular:  Negative for chest pain.  Gastrointestinal:  Positive for hematochezia. Negative for abdominal pain and diarrhea.       Rectal bleeding  Genitourinary:  Negative for frequency and hematuria.  Musculoskeletal:  Negative for back pain.  Skin:  Negative for rash.  Neurological:  Negative for seizures and headaches.  Psychiatric/Behavioral:  Negative for hallucinations.     Physical Exam Updated Vital Signs BP 118/83 (BP Location: Right Arm)   Pulse 73   Temp 98.1 F (36.7 C)   Resp 19   Ht 5\' 7"  (1.702 m)   Wt (!) 190.5 kg   SpO2 93%   BMI 65.78 kg/m  Physical Exam Vitals and nursing note reviewed.  Constitutional:      Appearance: He is well-developed.  HENT:     Head: Normocephalic.  Eyes:     General: No scleral icterus.    Conjunctiva/sclera: Conjunctivae normal.  Neck:     Thyroid: No thyromegaly.  Cardiovascular:     Rate and Rhythm: Normal rate and regular rhythm.     Heart sounds: No murmur heard.    No friction rub. No gallop.  Pulmonary:     Breath sounds: No stridor. No wheezing or rales.  Chest:     Chest wall: No tenderness.  Abdominal:  General: There is no distension.     Tenderness: There is no abdominal tenderness. There is no rebound.     Comments: Dark stool heme positive  Musculoskeletal:        General: Normal range of motion.     Cervical back: Neck supple.  Lymphadenopathy:     Cervical: No cervical adenopathy.  Skin:    Findings: No erythema or rash.  Neurological:     Mental Status: He is alert and oriented to person, place, and time.     Motor: No abnormal muscle tone.     Coordination: Coordination normal.  Psychiatric:        Behavior: Behavior normal.     ED Results / Procedures / Treatments   Labs (all labs ordered are listed, but only abnormal results are displayed) Labs Reviewed  COMPREHENSIVE METABOLIC  PANEL - Abnormal; Notable for the following components:      Result Value   Calcium 8.5 (*)    Total Protein 6.1 (*)    Albumin 3.2 (*)    AST 13 (*)    All other components within normal limits  POC OCCULT BLOOD, ED - Abnormal; Notable for the following components:   Fecal Occult Bld POSITIVE (*)    All other components within normal limits  CBC WITH DIFFERENTIAL/PLATELET  LIPASE, BLOOD  I-STAT CHEM 8, ED  TYPE AND SCREEN    EKG None  Radiology No results found.  Procedures Procedures    Medications Ordered in ED Medications  sodium chloride 0.9 % bolus 1,000 mL (1,000 mLs Intravenous Bolus 08/17/22 1556)  pantoprazole (PROTONIX) injection 40 mg (40 mg Intravenous Given 08/17/22 1557)  acetaminophen (TYLENOL) tablet 650 mg (650 mg Oral Given 08/17/22 1627)    ED Course/ Medical Decision Making/ A&P        Patient with rectal bleeding.  I spoke with Dr. Jena Gauss gastroenterologist and he recommended the patient get Protonix IV and be put on clear liquids and GI will consult tomorrow                           Medical Decision Making Amount and/or Complexity of Data Reviewed Labs: ordered.  Risk OTC drugs. Prescription drug management. Decision regarding hospitalization.  This patient presents to the ED for concern of GI bleeding, this involves an extensive number of treatment options, and is a complaint that carries with it a high risk of complications and morbidity.  The differential diagnosis includes ulcer, diverticulosis   Co morbidities that complicate the patient evaluation  Morbid obesity   Additional history obtained:  Additional history obtained from patient External records from outside source obtained and reviewed including hospital records   Lab Tests:  I Ordered, and personally interpreted labs.  The pertinent results include: Hemoglobin 13 and positive rectal exam for Hemoccult   Imaging Studies ordered:  No imaging  Cardiac Monitoring: /  EKG:  The patient was maintained on a cardiac monitor.  I personally viewed and interpreted the cardiac monitored which showed an underlying rhythm of: Normal sinus rhythm   Consultations Obtained:  I requested consultation with the GI and hospitalist,  and discussed lab and imaging findings as well as pertinent plan - they recommend: Hospitalist admit with GI consult   Problem List / ED Course / Critical interventions / Medication management  Morbid obesity, GI bleed I ordered medication including Protonix for GI bleed Reevaluation of the patient after these medicines showed that the  patient stayed the same I have reviewed the patients home medicines and have made adjustments as needed   Social Determinants of Health:  Had a nursing home   Test / Admission - Considered:  None  Patient will be admitted for GI bleed and started on Protonix IV        Final Clinical Impression(s) / ED Diagnoses Final diagnoses:  Rectal bleeding    Rx / DC Orders ED Discharge Orders     None         Bethann Berkshire, MD 08/19/22 1325

## 2022-08-18 DIAGNOSIS — K8050 Calculus of bile duct without cholangitis or cholecystitis without obstruction: Secondary | ICD-10-CM | POA: Diagnosis not present

## 2022-08-18 DIAGNOSIS — K625 Hemorrhage of anus and rectum: Secondary | ICD-10-CM

## 2022-08-18 LAB — MAGNESIUM: Magnesium: 1.8 mg/dL (ref 1.7–2.4)

## 2022-08-18 LAB — CBC
HCT: 43.2 % (ref 39.0–52.0)
Hemoglobin: 13.6 g/dL (ref 13.0–17.0)
MCH: 28.9 pg (ref 26.0–34.0)
MCHC: 31.5 g/dL (ref 30.0–36.0)
MCV: 91.9 fL (ref 80.0–100.0)
Platelets: 239 10*3/uL (ref 150–400)
RBC: 4.7 MIL/uL (ref 4.22–5.81)
RDW: 14.2 % (ref 11.5–15.5)
WBC: 6.9 10*3/uL (ref 4.0–10.5)
nRBC: 0 % (ref 0.0–0.2)

## 2022-08-18 LAB — COMPREHENSIVE METABOLIC PANEL
ALT: 20 U/L (ref 0–44)
AST: 15 U/L (ref 15–41)
Albumin: 3.4 g/dL — ABNORMAL LOW (ref 3.5–5.0)
Alkaline Phosphatase: 55 U/L (ref 38–126)
Anion gap: 10 (ref 5–15)
BUN: 11 mg/dL (ref 6–20)
CO2: 27 mmol/L (ref 22–32)
Calcium: 8.9 mg/dL (ref 8.9–10.3)
Chloride: 103 mmol/L (ref 98–111)
Creatinine, Ser: 0.65 mg/dL (ref 0.61–1.24)
GFR, Estimated: >60 mL/min (ref >60)
Glucose, Bld: 92 mg/dL (ref 70–99)
Potassium: 3.9 mmol/L (ref 3.5–5.1)
Sodium: 140 mmol/L (ref 135–145)
Total Bilirubin: 0.7 mg/dL (ref 0.3–1.2)
Total Protein: 6.4 g/dL — ABNORMAL LOW (ref 6.5–8.1)

## 2022-08-18 LAB — PHOSPHORUS: Phosphorus: 4.2 mg/dL (ref 2.5–4.6)

## 2022-08-18 MED ORDER — DICYCLOMINE HCL 10 MG PO CAPS: 10.0000 mg | ORAL_CAPSULE | Freq: Three times a day (TID) | ORAL | Status: DC | PRN

## 2022-08-18 MED ORDER — HYDROCORTISONE ACETATE 25 MG RE SUPP: 25.0000 mg | Freq: Two times a day (BID) | RECTAL | Status: DC

## 2022-08-18 MED ORDER — GABAPENTIN 300 MG PO CAPS
300.0000 mg | ORAL_CAPSULE | Freq: Four times a day (QID) | ORAL | Status: DC
  Administered 2022-08-18: 300 mg via ORAL
  Filled 2022-08-18: qty 1

## 2022-08-18 MED ORDER — HYDROCORTISONE ACETATE 25 MG RE SUPP: 25.0000 mg | Freq: Two times a day (BID) | RECTAL | 0 refills | Status: DC

## 2022-08-18 NOTE — TOC Initial Note (Signed)
Transition of Care [facility]) - Initial/Assessment Note    Patient Details  Name: [name] MRN: [id number] Date of Birth: [birth year]  Transition of Care [facility]) CM/SW Contact:    Villa Herb, LCSWA Phone Number: 08/18/2022, 11:12 AM  Clinical Narrative:                 CSW updated that pt arrived from [facility]. CSW spoke with Eunice Blase who states pt has been at their facility for SNF since March. Eunice Blase states they will need a new auth before pt can return to facility as he has been out of facility for 24 hours. PT to see pt. CSW to update Debbie that [name] can be started when PT note is entered. TOC to follow.   Expected Discharge Plan: Skilled Nursing Facility Barriers to Discharge: Continued Medical Work up   Patient Goals and CMS Choice Patient states their goals for this hospitalization and ongoing recovery are:: return to [facility] CMS Medicare.gov Compare Post Acute Care list provided to:: Patient Choice offered to / list presented to : Patient      Expected Discharge Plan and Services In-house Referral: Clinical Social Work Discharge Planning Services: CM Consult Post Acute Care Choice: Skilled Nursing Facility Living arrangements for the past 2 months: Skilled Nursing Facility                                      Prior Living Arrangements/Services Living arrangements for the past 2 months: Skilled Nursing Facility Lives with:: Facility Resident Patient language and need for interpreter reviewed:: Yes Do you feel safe going back to the place where you live?: Yes      Need for Family Participation in Patient Care: Yes (Comment) Care giver support system in place?: Yes (comment)   Criminal Activity/Legal Involvement Pertinent to Current Situation/Hospitalization: No - Comment as needed  Activities of Daily Living Home Assistive Devices/Equipment: None, Walker (specify type), Wheelchair ADL Screening (condition at time of admission) Patient's cognitive  ability adequate to safely complete daily activities?: Yes Is the patient deaf or have difficulty hearing?: No Does the patient have difficulty seeing, even when wearing glasses/contacts?: No Does the patient have difficulty concentrating, remembering, or making decisions?: No Patient able to express need for assistance with ADLs?: Yes Does the patient have difficulty dressing or bathing?: No Independently performs ADLs?: Yes (appropriate for developmental age) Does the patient have difficulty walking or climbing stairs?: Yes Weakness of Legs: Both Weakness of Arms/Hands: None  Permission Sought/Granted                  Emotional Assessment Appearance:: Appears stated age Attitude/Demeanor/Rapport: Engaged Affect (typically observed): Accepting Orientation: : Oriented to Self, Oriented to Place, Oriented to  Time, Oriented to Situation Alcohol / Substance Use: Not Applicable Psych Involvement: No (comment)  Admission diagnosis:  Rectal bleeding [K62.5] GI bleed [K92.2] Patient Active Problem List   Diagnosis Date Noted   GI bleed 08/17/2022   Nausea 08/17/2022   GERD (gastroesophageal reflux disease) 08/17/2022   Fall at home, initial encounter 06/27/2022   Leukocytosis 06/27/2022   Peptic ulcer disease 06/27/2022   Left hip pain 06/26/2022   Gout 12/29/2017   Tobacco abuse 12/29/2017   Acute hypoxemic respiratory failure (HCC) 12/29/2017   Bronchitis 12/29/2017   Rectal bleed 07/30/2014   Hiatal hernia    Hemorrhoid    Acute lower GI bleeding 07/29/2014  Abdominal pain    Wrist pain, acute    Tenosynovitis of finger, hand, or wrist    Cellulitis of hand 03/10/2014   Right upper quadrant abdominal pain 02/10/2014   Cholelithiasis 02/10/2014   Polyarthritis 11/08/2012   UTI (lower urinary tract infection) 10/21/2012   Cellulitis 09/17/2012   Tinea pedis 09/17/2012   Erosive esophagitis 09/07/2012   Hypokalemia 09/05/2012   Gastric ulcer with hemorrhage  09/02/2012   Upper GI bleed 09/02/2012   Acute blood loss anemia 09/02/2012   Hematemesis 09/01/2012   Normocytic anemia 09/01/2012   Acute gouty arthritis 09/01/2012   Morbid obesity (HCC) 09/01/2012   PCP:  Toma Deiters, MD Pharmacy:   [facility] [place], [place] - [address] [place] [address] Phone: [phone] Fax: [phone]     Social Determinants of Health (SDOH) Social History: SDOH Screenings   Food Insecurity: No Food Insecurity (08/17/2022)  Housing: Low Risk  (08/17/2022)  Transportation Needs: No Transportation Needs (08/17/2022)  Utilities: Not At Risk (08/17/2022)  Tobacco Use: High Risk (08/17/2022)   SDOH Interventions:     Readmission Risk Interventions     No data to display

## 2022-08-18 NOTE — Progress Notes (Signed)
PROGRESS NOTE    [name]  JXB:[id number] DOB: 08-28-1985 DOA: 08/17/2022 PCP: Toma Deiters, MD    Brief Narrative:  [name] is a 37 y.o. male from a SNF with medical history significant of gout, NSAID induced PUD, osteoarthritis, biliary colic secondary to cholelithiasis, morbid obesity who presents to the emergency department due to 1 day onset of rectal bleeding.  Patient complains of 3 episodes of rectal bleed yesterday and 2 episodes today, so he presented to the ED for further evaluation and management. He presented to the emergency department on 4/27 due to abdominal pain and nausea, he was noted to have biliary colic secondary to cholelithiasis by general surgery (Dr. Lovell Sheehan) and recommended that patient will need to go to [facility] in [place] due to super morbid obesity states should he require surgery.  Patient states that he continues to have abdominal pain with radiation to right side of back and occasional nausea.  Pain was rated as 8/10 on pain scale without any known aggravating/alleviating factors.  Patient denies use of NSAIDs, BC powder or Goody powders.   Assessment and Plan:  GI bleed?--- known hemorrhoids dating back to 2016 H/H= 13.1/40.5, this was 12.9/41.4 on 08/12/2022 Hemoccult was positive No indication for blood transfusion at this time Continue IV Protonix 40 mg twice daily GI consulted    Abdominal pain and nausea Patient has a history of biliary colic secondary to cholelithiasis RUQ ultrasound done 08/12/2022 showed cholelithiasis with gallbladder wall thickening CT abdomen and pelvis with contrast done on 4/27 showed cholelithiasis  From GI note in 2016:  Patient with persistent right upper quadrant pain. Extensive workup including recent endoscopy, colonoscopy, and CT scans all with no acute process. HIDA scan completed which showed markedly decreased ejection fraction of 6%. Surgical consult note wall hospitalized noted not  apparent acute cholecystitis despite noted cholelithiasis. Surgery reluctant to perform laparoscopic cholecystectomy due to patient high risk with morbid obesity. At this point is appears to be the only thing left to explain his pain. He does have a history of bleeding ulcers however his most recent endoscopy this year showed the ulcer to be healed and he continues on PPI therapy. We will be referred to surgery for consideration of surgical intervention at their discretion.     GERD Continue IV Protonix   Gout Continue allopurinol   Morbid obesity (BMI 65.78) Estimated body mass index is 81.13 kg/m as calculated from the following:   Height as of this encounter: 5\' 7"  (1.702 m).   Weight as of this encounter: 235 kg.    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 Multiphase CT or MRI with adrenal protocol is recommended for further evaluation on nonemergent follow-up.  DVT prophylaxis: SCDs Start: 08/17/22 1951    Code Status: Full Code   Disposition Plan:  Level of care: Med-Surg Status is: Observation     Consultants:  GI   Subjective: C/o pain in multiple sites  Objective: Vitals:   08/17/22 1951 08/17/22 2135 08/18/22 0059 08/18/22 0418  BP:  135/73 114/71 133/79  Pulse:  74 67 74  Resp:  18 18 18   Temp:  97.6 F (36.4 C) 97.7 F (36.5 C) 97.8 F (36.6 C)  TempSrc:  Oral Oral   SpO2: 93% 94% 95% 97%  Weight:  (!) 235 kg    Height:  5\' 7"  (1.702 m)      Intake/Output Summary (Last 24 hours) at  08/18/2022 1115 Last data filed at 08/18/2022 0500 Gross per 24 hour  Intake 0 ml  Output 1200 ml  Net -1200 ml   Filed Weights   08/17/22 1513 08/17/22 2135  Weight: (!) 190.5 kg (!) 235 kg    Examination:   General: Appearance:    Severely obese male in no acute distress     Lungs:     respirations unlabored  Heart:    Normal heart rate.   MS:   All extremities are intact.    Neurologic:    Awake, alert, oriented x 3. No apparent focal neurological           defect.        Data Reviewed: I have personally reviewed following labs and imaging studies  CBC: Recent Labs  Lab 08/12/22 0006 08/17/22 1605 08/18/22 0414  WBC 8.4 6.2 6.9  NEUTROABS  --  3.5  --   HGB 12.9* 13.1 13.6  HCT 41.4 40.5 43.2  MCV 93.5 90.8 91.9  PLT 245 219 239   Basic Metabolic Panel: Recent Labs  Lab 08/12/22 0006 08/17/22 1605 08/18/22 0414  NA 140 138 140  K 3.5 3.6 3.9  CL 103 101 103  CO2 29 28 27   GLUCOSE 127* 96 92  BUN 18 13 11   CREATININE 0.64 0.65 0.65  CALCIUM 8.7* 8.5* 8.9  MG  --   --  1.8  PHOS  --   --  4.2   GFR: Estimated Creatinine Clearance: 241.4 mL/min (by C-G formula based on SCr of 0.65 mg/dL). Liver Function Tests: Recent Labs  Lab 08/12/22 0006 08/17/22 1605 08/18/22 0414  AST 13* 13* 15  ALT 20 20 20   ALKPHOS 54 52 55  BILITOT 0.6 0.5 0.7  PROT 6.3* 6.1* 6.4*  ALBUMIN 3.1* 3.2* 3.4*   Recent Labs  Lab 08/12/22 0006 08/17/22 1605  LIPASE 35 28   No results for input(s): "AMMONIA" in the last 168 hours. Coagulation Profile: No results for input(s): "INR", "PROTIME" in the last 168 hours. Cardiac Enzymes: No results for input(s): "CKTOTAL", "CKMB", "CKMBINDEX", "TROPONINI" in the last 168 hours. BNP (last 3 results) No results for input(s): "PROBNP" in the last 8760 hours. HbA1C: No results for input(s): "HGBA1C" in the last 72 hours. CBG: No results for input(s): "GLUCAP" in the last 168 hours. Lipid Profile: No results for input(s): "CHOL", "HDL", "LDLCALC", "TRIG", "CHOLHDL", "LDLDIRECT" in the last 72 hours. Thyroid Function Tests: No results for input(s): "TSH", "T4TOTAL", "FREET4", "T3FREE", "THYROIDAB" in the last 72 hours. Anemia Panel: No results for input(s): "VITAMINB12", "FOLATE", "FERRITIN", "TIBC", "IRON", "RETICCTPCT" in the last 72 hours. Sepsis Labs: No results for input(s): "PROCALCITON", "LATICACIDVEN" in the last  168 hours.  No results found for this or any previous visit (from the past 240 hour(s)).       Radiology Studies: No results found.      Scheduled Meds:  allopurinol  100 mg Oral Daily   pantoprazole (PROTONIX) IV  40 mg Intravenous Q12H   Continuous Infusions:   LOS: 0 days    Time spent: 45 minutes spent on chart review, discussion with nursing staff, consultants, updating family and interview/physical exam; more than 50% of that time was spent in counseling and/or coordination of care.    Joseph Art, DO [facility] Available via Epic secure chat 7am-7pm After these hours, please refer to coverage provider listed on [email or web address] 08/18/2022, 11:15 AM

## 2022-08-18 NOTE — TOC Transition Note (Signed)
Transition of Care [facility]) - CM/SW Discharge Note   Patient Details  Name: [name] MRN: [id number] Date of Birth: 03/30/1986  Transition [facility] Care [facility]) CM/SW Contact:  Villa Herb, LCSWA Phone Number: 08/18/2022, 2:33 PM   Clinical Narrative:    CSW updated that pt is medically ready for D/C. CSW spoke to [place] with [facility] who states they are ready to accept pt today. CSW provided RN with numbers for room and report. RN updated CSW that she will have secretary call for transport.    Barriers to Discharge: Continued Medical Work up   Patient Goals and CMS Choice CMS Medicare.gov Compare Post Acute Care list provided to:: Patient Choice offered to / list presented to : Patient  Discharge Placement                         Discharge Plan and Services Additional resources added to the After Visit Summary for   In-house Referral: Clinical Social Work Discharge Planning Services: CM Consult Post Acute Care Choice: Skilled Nursing Facility                               Social Determinants of Health (SDOH) Interventions SDOH Screenings   Food Insecurity: No Food Insecurity (08/17/2022)  Housing: Low Risk  (08/17/2022)  Transportation Needs: No Transportation Needs (08/17/2022)  Utilities: Not At Risk (08/17/2022)  Tobacco Use: High Risk (08/17/2022)     Readmission Risk Interventions     No data to display

## 2022-08-18 NOTE — Progress Notes (Signed)
Pt complained of 7/10 pain in abdomen and back. PRN Morphine given. Vitals stable during the night. New PIV placed in R hand. Pt complained of a headache this a.m. PRN Tylenol given.

## 2022-08-18 NOTE — Consult Note (Signed)
Gastroenterology Consult   Referring Provider: No ref. provider found Primary Care Physician:  Toma Deiters, MD Primary Gastroenterologist:  Gerrit Friends.Rourk, MD  Patient ID: [name]; [id number]; 06-24-1985   Admit date: 08/17/2022  LOS: 0 days   Date of Consultation: 08/18/2022  Reason for Consultation:  rectal bleeding  History of Present Illness   [name] is a 37 y.o. year old male with a history of morbid obesity anemia, cellulitis, cholelithiasis, NSAID induced gastric ulcer, and osteoarthritis who resides at a nursing home who presented to the ED with 1 day of rectal bleeding.  GI consulted for further evaluation.  ED Course: Labs - Hgb 13.1 albumin 3.2, normal LFTs, normal lipase, FOBT positive Treated with Zofran, Tylenol, and a 1 L saline.   Consult: Denies constipation. Reports prior history of gastric ulcer. Denies melena, NSAIDs. Does have lack of appetite related to his RUQ pain. Has eaten very little since Wednesday last week. Mostly clears. Unable to tolerate soups and similar foods as this causes diarrhea. States he only came to the ED due to the rectal bleeding, has been trying to manage the pain at home.   Recently seen in the ED 4/27.  RUQ US showed cholelithiasis and gallbladder wall thickening.  CT scan showed cholelithiasis.  Was treated with morphine and Zofran.  Advised surgical consult if abdominal pain continued however he would likely need surgery performed at [facility] long given his morbid obesity state.  Previous admission April 2016 for esophagitis and abdominal pain.  EGD at that time and was noted to have a gastric ulcer that was completely he recommended to decrease pantoprazole to 30 mg once daily.  Also underwent colonoscopy and was found to have anal canal/internal hemorrhoids, otherwise normal.  Last seen in our office in October 2016 with complaints of hemorrhoids persistent abdominal pain despite PPI.  He underwent CT scan with no  acute process, evidence of cholelithiasis, fatty liver.  HIDA scan completed in October 2015 and was found to have markedly decreased gallbladder ejection fraction of 6% asymptomatic with CCK administration.  Surgery elected to perform cholecystectomy given morbid obesity.  He had reported bright red blood in stool with history of hemorrhoids.  He was referred to surgery for consideration of surgical intervention at their discretion.  Proctosol rectal bleeding secondary to hemorrhoids.  Advised that if symptoms continued he could possibly be a candidate for hemorrhoid banding.    Past Medical History:  Diagnosis Date   Acute blood loss anemia 09/02/2012   S/p 1 unit rbcs   Cellulitis    Erosive esophagitis 09/01/2012   NSAID induced.   Gall stones    Gastric ulcer with hemorrhage 09/02/2012   s/p bleeding control tx. Per Dr. Jena Gauss   Gout    Obesity    UTI (lower urinary tract infection)     Past Surgical History:  Procedure Laterality Date   COLONOSCOPY WITH PROPOFOL N/A 07/30/2014   RMR: Colonoscopy anal canal/internal hemorrhoids, otherwise normal ileocolonoscoopy.    ESOPHAGOGASTRODUODENOSCOPY Left 09/01/2012   HYQ:MVHQIO esophageal erosions consistent with erosive reflux esophagitis/Pre-pyloric benign-appearing gastric ulcer with bleeding stigmata status post bleeding control therapy as described above   ESOPHAGOGASTRODUODENOSCOPY N/A 12/13/2012   NGE:XBMWUX hernia. PReviously noted gastric ulcer completely healed.    ESOPHAGOGASTRODUODENOSCOPY (EGD) WITH PROPOFOL N/A 07/30/2014   Procedure: ESOPHAGOGASTRODUODENOSCOPY (EGD) WITH PROPOFOL;  Surgeon: Corbin Ade, MD;  Location: [facility];  Service: Endoscopy;  Laterality: N/A;   TONSILLECTOMY  Prior to Admission medications   Medication Sig Start Date End Date Taking? Authorizing Provider  acetaminophen (TYLENOL) 325 MG tablet Take 2 tablets (650 mg total) by mouth every 6 (six) hours as needed for mild pain. 07/01/22  Yes Tyrone Nine, MD  allopurinol (ZYLOPRIM) 100 MG tablet Take 1 tablet (100 mg total) by mouth daily. 07/02/22  Yes Tyrone Nine, MD  gabapentin (NEURONTIN) 300 MG capsule Take 1 capsule (300 mg total) by mouth 4 (four) times daily. 07/01/22  Yes Tyrone Nine, MD  ondansetron (ZOFRAN) 4 MG tablet Take 4 mg by mouth every 8 (eight) hours as needed. 08/12/22  Yes [provider]  pantoprazole (PROTONIX) 40 MG tablet Take 1 tablet (40 mg total) by mouth 2 (two) times daily before a meal. 07/01/22  Yes Tyrone Nine, MD  ondansetron (ZOFRAN-ODT) 4 MG disintegrating tablet Take 1 tablet (4 mg total) by mouth every 8 (eight) hours as needed for nausea. Patient not taking: Reported on 08/17/2022 08/12/22   Eber Hong, MD  PERCOCET 10-325 MG tablet Take 1 tablet by mouth every 4 (four) hours as needed. Patient not taking: Reported on 08/17/2022 07/06/22   [provider]  promethazine (PHENERGAN) 25 MG/ML injection Inject 25 mg into the muscle every 6 (six) hours. Patient not taking: Reported on 08/17/2022 07/25/22   [provider]  sodium chloride 0.9 % infusion Inject into the vein. Patient not taking: Reported on 08/17/2022 07/25/22   [provider]    Current Facility-Administered Medications  Medication Dose Route Frequency Provider Last Rate Last Admin   acetaminophen (TYLENOL) tablet 650 mg  650 mg Oral Q6H PRN Adefeso, Oladapo, DO   650 mg at 08/18/22 0430   Or   acetaminophen (TYLENOL) suppository 650 mg  650 mg Rectal Q6H PRN Adefeso, Oladapo, DO       allopurinol (ZYLOPRIM) tablet 100 mg  100 mg Oral Daily Adefeso, Oladapo, DO       morphine (PF) 2 MG/ML injection 2 mg  2 mg Intravenous Q4H PRN Adefeso, Oladapo, DO   2 mg at 08/18/22 0628   ondansetron (ZOFRAN) tablet 4 mg  4 mg Oral Q6H PRN Adefeso, Oladapo, DO       Or   ondansetron (ZOFRAN) injection 4 mg  4 mg Intravenous Q6H PRN Adefeso, Oladapo, DO       pantoprazole (PROTONIX) injection 40 mg  40 mg Intravenous Q12H  Adefeso, Oladapo, DO   40 mg at 08/18/22 0802    Allergies as of 08/17/2022 - Review Complete 08/17/2022  Allergen Reaction Noted   Asa [aspirin] Other (See Comments) 03/05/2012   Clindamycin/lincomycin  04/09/2015   Nsaids Other (See Comments) 06/25/2013   Penicillins Other (See Comments) 03/05/2012   Sulfa antibiotics Other (See Comments) 03/05/2012    Family History  Problem Relation Age of Onset   Heart failure Mother    Colon cancer Neg Hx     Social History   Socioeconomic History   Marital status: Single    Spouse name: Not on file   Number of children: Not on file   Years of education: Not on file   Highest education level: Not on file  Occupational History   Not on file  Tobacco Use   Smoking status: Heavy Smoker    Packs/day: 1.00    Years: 7.00    Additional pack years: 0.00    Total pack years: 7.00    Types: Cigarettes   Smokeless tobacco: Current  Types: Snuff   Tobacco comments:    daily in the evenings\  Substance and Sexual Activity   Alcohol use: No    Alcohol/week: 0.0 standard drinks of alcohol   Drug use: Yes    Types: Marijuana    Comment: if in pain   Sexual activity: Never  Other Topics Concern   Not on file  Social History Narrative   Not on file   Social Determinants of Health   Financial Resource Strain: Not on file  Food Insecurity: No Food Insecurity (08/17/2022)   Hunger Vital Sign    Worried About Running Out of Food in the Last Year: Never true    Ran Out of Food in the Last Year: Never true  Transportation Needs: No Transportation Needs (08/17/2022)   PRAPARE - Administrator, Civil Service (Medical): No    Lack of Transportation (Non-Medical): No  Physical Activity: Not on file  Stress: Not on file  Social Connections: Not on file  Intimate Partner Violence: Not At Risk (08/17/2022)   Humiliation, Afraid, Rape, and Kick questionnaire    Fear of Current or Ex-Partner: No    Emotionally Abused: No     Physically Abused: No    Sexually Abused: No     Review of Systems   Gen: Denies any fever, chills, loss of appetite, change in weight or weight loss CV: Denies chest pain, heart palpitations, syncope, edema  Resp: Denies shortness of breath with rest, cough, wheezing, coughing up blood, and pleurisy. GI: see HPI GU : Denies urinary burning, blood in urine, urinary frequency, and urinary incontinence. MS: Denies joint pain, limitation of movement, swelling, cramps, and atrophy.  Derm: Denies rash, itching, dry skin, hives. Psych: Denies depression, anxiety, memory loss, hallucinations, and confusion. Heme: Denies bruising or bleeding Neuro:  Denies any headaches, dizziness, paresthesias, shaking  Physical Exam   Vital Signs in last 24 hours: Temp:  [97.6 F (36.4 C)-98.1 F (36.7 C)] 97.8 F (36.6 C) (05/03 0418) Pulse Rate:  [67-85] 74 (05/03 0418) Resp:  [18-19] 18 (05/03 0418) BP: (114-135)/(61-83) 133/79 (05/03 0418) SpO2:  [92 %-97 %] 97 % (05/03 0418) FiO2 (%):  [21 %] 21 % (05/02 1951) Weight:  [190.5 kg-235 kg] 235 kg (05/02 2135) Last BM Date : 08/17/22  General:   Alert,  Well-developed, well-nourished, pleasant and cooperative in NAD Head:  Normocephalic and atraumatic. Eyes:  Sclera clear, no icterus.   Conjunctiva pink. Ears:  Normal auditory acuity. Abdomen:  exam limited given body habitus. TTP to RUQ with gentle palpation.  Rectal: good tone, no external hemorrhoids. No evidence of anal fissure. No mass. Possible hemorrhoids noted to right anterior hemorrhoid column.  Neurologic:  Alert and  oriented x4. Psych:  Alert and cooperative. Normal mood and affect.  Intake/Output from previous day: 05/02 0701 - 05/03 0700 In: 0  Out: 1200 [Urine:1200] Intake/Output this shift: No intake/output data recorded.   Labs/Studies   Recent Labs Recent Labs    08/17/22 1605 08/18/22 0414  WBC 6.2 6.9  HGB 13.1 13.6  HCT 40.5 43.2  PLT 219 239    BMET Recent Labs    08/17/22 1605 08/18/22 0414  NA 138 140  K 3.6 3.9  CL 101 103  CO2 28 27  GLUCOSE 96 92  BUN 13 11  CREATININE 0.65 0.65  CALCIUM 8.5* 8.9   LFT Recent Labs    08/17/22 1605 08/18/22 0414  PROT 6.1* 6.4*  ALBUMIN 3.2* 3.4*  AST 13* 15  ALT 20 20  ALKPHOS 52 55  BILITOT 0.5 0.7   PT/INR No results for input(s): "LABPROT", "INR" in the last 72 hours. Hepatitis Panel No results for input(s): "HEPBSAG", "HCVAB", "HEPAIGM", "HEPBIGM" in the last 72 hours. C-Diff No results for input(s): "CDIFFTOX" in the last 72 hours.  Radiology/Studies No results found.   Assessment   [name] is a 37 y.o. year old male with a history of morbid obesity anemia, cellulitis, cholelithiasis, NSAID induced gastric ulcer, and osteoarthritis who resides at a nursing home who presented to the ED with 1 day of rectal bleeding.  GI consulted for further evaluation.  Rectal bleeding: Patient presented with 4-5 episodes of rectal bleeding.  Hemoglobin 13.1 on admission.  Hemoglobin stable at 13.6 today.  Known history of hemorrhoids on prior colonoscopy in 2016. Rectal exam today without mass, possible hemorrhoids noted to right anterior column. No evidence of anal fissure.   RUQ Abdominal pain: Patient has been experiencing right upper quadrant abdominal pain, likely biliary colic.  Prior CT with evidence of cholelithiasis.  Given his morbid obesity he is at high surgical risk therefore if his pain worsened or if he develops signs he will need transfer to [facility] for intervention. Still with abdominal pain and lack of appetite. Would be interested in outpatient referral to consider cholecystectomy for his biliary colic. Tender on exam today. Patient aware he is high risk for surgery.   Plan / Recommendations   Anusol BID GI soft diet, low fat Consider referral to general surgery in [place] to have cholecystectomy performed at [facility]. Could consider  outpatient tertiary referral for cholecystectomy if needed. If recurrent rectal bleeding with drop in Hgb then would recommend colonoscopy.  May be a hemorrhoid banding candidate.    08/18/2022, 11:03 AM  Brooke Bonito, MSN, FNP-BC, AGACNP-BC [facility]

## 2022-08-18 NOTE — Discharge Summary (Addendum)
Physician Discharge Summary  DEAUNDRE SCHOENROCK ZOX:[id number] DOB: November 15, 1985 DOA: 08/17/2022  PCP: Toma Deiters, MD  Admit date: 08/17/2022 Discharge date: 08/18/2022  Admitted From: SNF Discharge disposition: SNF   Recommendations for Outpatient Follow-Up:   Referral to [facility] in [facility] for outpatient folow up of biliary colic-- high risk for procedure-- if develops fever/elevated liver enzymes or other signs of acute cholecystis will need sooner evaluation at [facility] Anusol BID- if further bleeding and a drop in hgb will need colonoscopy CBC,bmp 1 week LOW FAT diet Bentyl PRN abdominal pain   Discharge Diagnosis:   Principal Problem:   GI bleed Active Problems:   Morbid obesity (HCC)   Abdominal pain   Gout   Nausea   GERD (gastroesophageal reflux disease)    Discharge Condition: Improved.  Diet recommendation: Low fat diet  Wound care: None.  Code status: Full.   History of Present Illness:    [name] is a 37 y.o. male with medical history significant of gout, NSAID induced PUD, osteoarthritis, biliary colic secondary to cholelithiasis, morbid obesity who presents to the emergency department due to 1 day onset of rectal bleeding.  Patient complains of 3 episodes of rectal bleed yesterday and 2 episodes today, so he presented to the ED for further evaluation and management. He presented to the emergency department on 4/27 due to abdominal pain and nausea, he was noted to have biliary colic secondary to cholelithiasis by general surgery (Dr. Lovell Sheehan) and recommended that patient will need to go to [facility] in [place] due to super morbid obesity states should he require surgery.  Patient states that he continues to have abdominal pain with radiation to right side of back and occasional nausea.  Pain was rated as 8/10 on pain scale without any known aggravating/alleviating factors.  Patient denies use of NSAIDs, BC powder or Goody  powders.   Hospital Course by Problem:   GI bleed?--- known hemorrhoids dating back to 2016 H/H= 13.1/40.5, this was 12.9/41.4 on 08/12/2022 Hemoccult was positive No indication for blood transfusion at this time Continue IV Protonix 40 mg twice daily GI consulted : Anusol BID, GI soft diet, low fat, Consider referral to general surgery in [place] to have cholecystectomy performed at [facility]. Could consider outpatient tertiary referral for cholecystectomy if needed, If recurrent rectal bleeding with drop in Hgb then would recommend colonoscopy, May be a hemorrhoid banding candidate.     Abdominal pain and nausea Patient has a history of biliary colic secondary to cholelithiasis RUQ ultrasound done 08/12/2022 showed cholelithiasis with gallbladder wall thickening CT abdomen and pelvis with contrast done on 4/27 showed cholelithiasis  From GI note in 2016:  Patient with persistent right upper quadrant pain. Extensive workup including recent endoscopy, colonoscopy, and CT scans all with no acute process. HIDA scan completed which showed markedly decreased ejection fraction of 6%. Surgical consult note wall hospitalized noted not apparent acute cholecystitis despite noted cholelithiasis. Surgery reluctant to perform laparoscopic cholecystectomy due to patient high risk with morbid obesity. At this point is appears to be the only thing left to explain his pain. He does have a history of bleeding ulcers however his most recent endoscopy this year showed the ulcer to be healed and he continues on PPI therapy. We will be referred to surgery for consideration of surgical intervention at their discretion.     -outpatient referral placed -PRN tylenol for pain -would avoid narcotics if able  GERD Continue  Protonix  Gout Continue allopurinol   Morbid obesity (BMI 65.78) Estimated body mass index is 81.13 kg/m as calculated from the following:   Height as of this encounter: 5\' 7"  (1.702  m).   Weight as of this encounter: 235 kg.     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 Multiphase CT or MRI with adrenal protocol is recommended for further evaluation on nonemergent follow-up.    Medical Consultants:    GI  Discharge Exam:   Vitals:   08/18/22 0418 08/18/22 1323  BP: 133/79 121/67  Pulse: 74 77  Resp: 18   Temp: 97.8 F (36.6 C) 97.8 F (36.6 C)  SpO2: 97% 94%   Vitals:   08/17/22 2135 08/18/22 0059 08/18/22 0418 08/18/22 1323  BP: 135/73 114/71 133/79 121/67  Pulse: 74 67 74 77  Resp: 18 18 18    Temp: 97.6 F (36.4 C) 97.7 F (36.5 C) 97.8 F (36.6 C) 97.8 F (36.6 C)  TempSrc: Oral Oral  Oral  SpO2: 94% 95% 97% 94%  Weight: (!) 235 kg     Height: 5\' 7"  (1.702 m)       General exam: Appears calm and comfortable.   The results of significant diagnostics from this hospitalization (including imaging, microbiology, ancillary and laboratory) are listed below for reference.     Procedures and Diagnostic Studies:   No results found.   Labs:   Basic Metabolic Panel: Recent Labs  Lab 08/12/22 0006 08/17/22 1605 08/18/22 0414  NA 140 138 140  K 3.5 3.6 3.9  CL 103 101 103  CO2 29 28 27   GLUCOSE 127* 96 92  BUN 18 13 11   CREATININE 0.64 0.65 0.65  CALCIUM 8.7* 8.5* 8.9  MG  --   --  1.8  PHOS  --   --  4.2   GFR Estimated Creatinine Clearance: 241.4 mL/min (by C-G formula based on SCr of 0.65 mg/dL). Liver Function Tests: Recent Labs  Lab 08/12/22 0006 08/17/22 1605 08/18/22 0414  AST 13* 13* 15  ALT 20 20 20   ALKPHOS 54 52 55  BILITOT 0.6 0.5 0.7  PROT 6.3* 6.1* 6.4*  ALBUMIN 3.1* 3.2* 3.4*   Recent Labs  Lab 08/12/22 0006 08/17/22 1605  LIPASE 35 28   No results for input(s): "AMMONIA" in the last 168 hours. Coagulation profile No results for input(s): "INR", "PROTIME" in the last 168 hours.  CBC: Recent Labs  Lab 08/12/22 0006 08/17/22 1605  08/18/22 0414  WBC 8.4 6.2 6.9  NEUTROABS  --  3.5  --   HGB 12.9* 13.1 13.6  HCT 41.4 40.5 43.2  MCV 93.5 90.8 91.9  PLT 245 219 239   Cardiac Enzymes: No results for input(s): "CKTOTAL", "CKMB", "CKMBINDEX", "TROPONINI" in the last 168 hours. BNP: Invalid input(s): "POCBNP" CBG: No results for input(s): "GLUCAP" in the last 168 hours. D-Dimer No results for input(s): "DDIMER" in the last 72 hours. Hgb A1c No results for input(s): "HGBA1C" in the last 72 hours. Lipid Profile No results for input(s): "CHOL", "HDL", "LDLCALC", "TRIG", "CHOLHDL", "LDLDIRECT" in the last 72 hours. Thyroid function studies No results for input(s): "TSH", "T4TOTAL", "T3FREE", "THYROIDAB" in the last 72 hours.  Invalid input(s): "FREET3" Anemia work up No results for input(s): "VITAMINB12", "FOLATE", "FERRITIN", "TIBC", "IRON", "RETICCTPCT" in the last 72 hours. Microbiology No results found for this or any previous visit (from the past 240 hour(s)).   Discharge Instructions:   Discharge Instructions  Ambulatory referral to General Surgery   Complete by: As directed    What is the reason for referral?: Other Comment - biliay colic   Discharge instructions   Complete by: As directed    Low fat diet Referral to [place] [facility] made for outpatient GB removal Would use tylenol for pain   Increase activity slowly   Complete by: As directed       Allergies as of 08/18/2022       Reactions   Asa [aspirin] Other (See Comments)   Pt doesn't know reaction.   Clindamycin/lincomycin    Gastric ulcer    Nsaids Other (See Comments)   Causes Ulcers    Penicillins Other (See Comments)   Pt doesn't know reaction. Has patient had a PCN reaction causing immediate rash, facial/tongue/throat swelling, SOB or lightheadedness with hypotension: No Has patient had a PCN reaction causing severe rash involving mucus membranes or skin necrosis: No Has patient had a PCN reaction that required  hospitalization No Has patient had a PCN reaction occurring within the last 10 years: No If all of the above answers are "NO", then may proceed with Cephalosporin use.   Sulfa Antibiotics Other (See Comments)   Pt doesn't know reaction.        Medication List     STOP taking these medications    ondansetron 4 MG tablet Commonly known as: ZOFRAN   Percocet 10-325 MG tablet Generic drug: oxyCODONE-acetaminophen   promethazine 25 MG/ML injection Commonly known as: PHENERGAN   sodium chloride 0.9 % infusion       TAKE these medications    acetaminophen 325 MG tablet Commonly known as: TYLENOL Take 2 tablets (650 mg total) by mouth every 6 (six) hours as needed for mild pain.   allopurinol 100 MG tablet Commonly known as: ZYLOPRIM Take 1 tablet (100 mg total) by mouth daily.   dicyclomine 10 MG capsule Commonly known as: BENTYL Take 1 capsule (10 mg total) by mouth 3 (three) times daily as needed for spasms (abdominal pain).   gabapentin 300 MG capsule Commonly known as: NEURONTIN Take 1 capsule (300 mg total) by mouth 4 (four) times daily.   hydrocortisone 25 MG suppository Commonly known as: ANUSOL-HC Place 1 suppository (25 mg total) rectally 2 (two) times daily.   ondansetron 4 MG disintegrating tablet Commonly known as: ZOFRAN-ODT Take 1 tablet (4 mg total) by mouth every 8 (eight) hours as needed for nausea.   pantoprazole 40 MG tablet Commonly known as: PROTONIX Take 1 tablet (40 mg total) by mouth 2 (two) times daily before a meal.          Time coordinating discharge: 45 min  Signed:  Joseph Art DO  [facility] 08/18/2022, 4:00 PM

## 2022-08-18 NOTE — Plan of Care (Signed)
  Problem: Acute Rehab PT Goals(only PT should resolve) Goal: Pt Will Go Supine/Side To Sit 08/18/2022 1135 by Ocie Bob, PT Outcome: Progressing 08/18/2022 1134 by Ocie Bob, PT Outcome: Progressing Flowsheets (Taken 08/18/2022 1134) Pt will go Supine/Side to Sit: with supervision   Problem: Acute Rehab PT Goals(only PT should resolve) Goal: Patient Will Perform Sitting Balance 08/18/2022 1135 by Ocie Bob, PT Outcome: Progressing 08/18/2022 1134 by Ocie Bob, PT Outcome: Progressing Flowsheets (Taken 08/18/2022 1134) Patient will perform sitting balance: with supervision   Problem: Acute Rehab PT Goals(only PT should resolve) Goal: Patient Will Transfer Sit To/From Stand 08/18/2022 1135 by Ocie Bob, PT Outcome: Progressing 08/18/2022 1134 by Ocie Bob, PT Outcome: Progressing Flowsheets (Taken 08/18/2022 1134) Patient will transfer sit to/from stand:  with min guard assist  with supervision   Problem: Acute Rehab PT Goals(only PT should resolve) Goal: Pt Will Perform Standing Balance Or Pre-Gait 08/18/2022 1135 by Ocie Bob, PT Outcome: Progressing 08/18/2022 1134 by Ocie Bob, PT Outcome: Progressing Flowsheets (Taken 08/18/2022 1134) Pt will perform standing balance or pre-gait: with Supervision   Problem: Acute Rehab PT Goals(only PT should resolve) Goal: Pt Will Transfer Bed To Chair/Chair To Bed Outcome: Progressing Flowsheets (Taken 08/18/2022 1135) Pt will Transfer Bed to Chair/Chair to Bed:  min guard assist  with supervision   Problem: Acute Rehab PT Goals(only PT should resolve) Goal: Pt Will Ambulate Outcome: Progressing Flowsheets (Taken 08/18/2022 1135) Pt will Ambulate:  25 feet  with minimal assist  with rolling walker   11:35 AM, 08/18/22 Ocie Bob, MPT Physical Therapist with [facility] [phone] office [phone] mobile phone

## 2022-08-18 NOTE — Evaluation (Signed)
Physical Therapy Evaluation Patient Details Name: [name] MRN: [id number] DOB: August 13, 1985 Today's Date: 08/18/2022  History of Present Illness  [name] is a 37 y.o. male with medical history significant of gout, NSAID induced PUD, osteoarthritis, biliary colic secondary to cholelithiasis, morbid obesity who presents to the emergency department due to 1 day onset of rectal bleeding.  Patient complains of 3 episodes of rectal bleed yesterday and 2 episodes today, so he presented to the ED for further evaluation and management.  He presented to the emergency department on 4/27 due to abdominal pain and nausea, he was noted to have biliary colic secondary to cholelithiasis by general surgery (Dr. Lovell Sheehan) and recommended that patient will need to go to [facility] in [place] due to super morbid obesity states should he require surgery.  Patient states that he continues to have abdominal pain with radiation to right side of back and occasional nausea.  Pain was rated as 8/10 on pain scale without any known aggravating/alleviating factors.  Patient denies use of NSAIDs, BC powder or Goody powders.   Clinical Impression  Patient demonstrates slow labored movement for sitting up at bedside with c/o increased stomach pain, limited to a few slow labored unsteady steps leaning on armrest of chair before having to sit due to generalized weakness and poor standing balance.  Patient requested to go back to bed due to stomach pain - RN notified.  Patient will benefit from continued skilled physical therapy in hospital and recommended venue below to increase strength, balance, endurance for safe ADLs and gait.        Recommendations for follow up therapy are one component of a multi-disciplinary discharge planning process, led by the attending physician.  Recommendations may be updated based on patient status, additional functional criteria and insurance authorization.  Follow Up Recommendations  Can patient physically be transported by private vehicle: Yes     Assistance Recommended at Discharge Set up Supervision/Assistance  Patient can return home with the following  A lot of help with walking and/or transfers;A little help with bathing/dressing/bathroom;Help with stairs or ramp for entrance;Assistance with cooking/housework    Equipment Recommendations None recommended by PT  Recommendations for Other Services       Functional Status Assessment Patient has had a recent decline in their functional status and demonstrates the ability to make significant improvements in function in a reasonable and predictable amount of time.     Precautions / Restrictions Precautions Precautions: Fall Restrictions Weight Bearing Restrictions: No      Mobility  Bed Mobility Overal bed mobility: Needs Assistance Bed Mobility: Supine to Sit, Sit to Supine     Supine to sit: Min guard, Min assist Sit to supine: Min guard, Min assist   General bed mobility comments: increased time, labored movement    Transfers Overall transfer level: Needs assistance Equipment used: 1 person hand held assist Transfers: Sit to/from Stand, Bed to chair/wheelchair/BSC Sit to Stand: Min assist   Step pivot transfers: Min assist       General transfer comment: increased time, labored movement having to lean on armest for w/c for support    Ambulation/Gait Ambulation/Gait assistance: Min assist Gait Distance (Feet): 3 Feet Assistive device: 1 person hand held assist Gait Pattern/deviations: Decreased step length - right, Decreased step length - left, Decreased stride length Gait velocity: slow     General Gait Details: limited to a few steps at bedside having to lean on armrest of chair for support, limited mostly  due to c/o severe stomach pain  Stairs            Wheelchair Mobility    Modified Rankin (Stroke Patients Only)       Balance Overall balance assessment: Needs  assistance Sitting-balance support: Feet supported, No upper extremity supported Sitting balance-Leahy Scale: Good Sitting balance - Comments: seated at EOB   Standing balance support: During functional activity, Single extremity supported Standing balance-Leahy Scale: Poor Standing balance comment: leaning on arest of w/c, bed rail                             Pertinent Vitals/Pain Pain Assessment Pain Assessment: Faces Faces Pain Scale: Hurts even more Pain Location: stomach Pain Descriptors / Indicators: Discomfort, Grimacing, Guarding, Aching Pain Intervention(s): Limited activity within patient's tolerance, Monitored during session, Repositioned    Home Living Family/patient expects to be discharged to:: Private residence Living Arrangements: Non-relatives/Friends Available Help at Discharge: Friend(s);Available PRN/intermittently Type of Home: Mobile home Home Access: Stairs to enter Entrance Stairs-Rails: Right Entrance Stairs-Number of Steps: 11-12 total   Home Layout: One level Home Equipment: Agricultural consultant (2 wheels)      Prior Function Prior Level of Function : Needs assist       Physical Assist : Mobility (physical);ADLs (physical) Mobility (physical): Bed mobility;Transfers;Gait;Stairs   Mobility Comments: Was walking with quad-cane for household and short distanced community prior to going to SNF for rehab ADLs Comments: Indepndent ADL; pt uses adaptive strategies like a seat for IADL's; assisted by friends for groceries     Hand Dominance   Dominant Hand: Right    Extremity/Trunk Assessment   Upper Extremity Assessment Upper Extremity Assessment: Generalized weakness    Lower Extremity Assessment Lower Extremity Assessment: Generalized weakness    Cervical / Trunk Assessment Cervical / Trunk Assessment: Normal  Communication   Communication: No difficulties  Cognition Arousal/Alertness: Awake/alert Behavior During Therapy: WFL  for tasks assessed/performed Overall Cognitive Status: Within Functional Limits for tasks assessed                                          General Comments      Exercises     Assessment/Plan    PT Assessment Patient needs continued PT services  PT Problem List Decreased strength;Decreased activity tolerance;Decreased balance;Decreased mobility       PT Treatment Interventions DME instruction;Gait training;Stair training;Functional mobility training;Therapeutic activities;Therapeutic exercise;Balance training;Patient/family education    PT Goals (Current goals can be found in the Care Plan section)  Acute Rehab PT Goals Patient Stated Goal: return home after rehab PT Goal Formulation: With patient Time For Goal Achievement: 09/01/22 Potential to Achieve Goals: Good    Frequency Min 3X/week     Co-evaluation               AM-PAC PT "6 Clicks" Mobility  Outcome Measure Help needed turning from your back to your side while in a flat bed without using bedrails?: None Help needed moving from lying on your back to sitting on the side of a flat bed without using bedrails?: A Little Help needed moving to and from a bed to a chair (including a wheelchair)?: A Little Help needed standing up from a chair using your arms (e.g., wheelchair or bedside chair)?: A Little Help needed to walk in hospital room?: A Lot Help needed  climbing 3-5 steps with a railing? : A Lot 6 Click Score: 17    End of Session   Activity Tolerance: Patient tolerated treatment well;Patient limited by fatigue;Patient limited by pain Patient left: in bed;with call bell/phone within reach Nurse Communication: Mobility status PT Visit Diagnosis: Unsteadiness on feet (R26.81);Other abnormalities of gait and mobility (R26.89);Muscle weakness (generalized) (M62.81)    Time: 1610-9604 PT Time Calculation (min) (ACUTE ONLY): 20 min   Charges:   PT Evaluation $PT Eval Moderate  Complexity: 1 Mod PT Treatments $Therapeutic Activity: 8-22 mins        11:32 AM, 08/18/22 Ocie Bob, MPT Physical Therapist with [facility] 336 [phone] office [phone] mobile phone

## 2022-12-04 ENCOUNTER — Ambulatory Visit: Payer: Self-pay | Admitting: General Surgery

## 2022-12-04 NOTE — Progress Notes (Signed)
Sent message, via epic in basket, requesting order in epic from surgeon  

## 2022-12-11 ENCOUNTER — Encounter (HOSPITAL_COMMUNITY)
Admission: RE | Admit: 2022-12-11 | Discharge: 2022-12-11 | Disposition: A | Discharge status: Home / Self Care | Payer: Medicaid Other | Source: Ambulatory Visit | Attending: Internal Medicine | Admitting: Internal Medicine

## 2022-12-19 NOTE — Patient Instructions (Addendum)
Preop instructions for:   [name]   Date of Birth:  1985/05/07                   Date of Procedure:  Sept. 5, 2024   Procedure:  LAPAROSCOPIC CHOLECYSTECTOMY     Surgeon: Rodman Pickle MD Facility contact:  [facility]     Phone: [phone]                Health Care POA: RN contact name/phone#:  Laurice Record, LPN                        and Fax #: [phone]   Transportation contact phone#: [facility] Driver [phone]  Please send day of procedure:  Current med list  Medications taken the day of procedure (return attached form to hospital) confirm time of nothing by mouth status (return attached form to hospital) Patient Demographic info( to include DNR status, problem list, allergies) Bring Insurance card and picture ID    Time to arrive at [facility]: 5:15 AM   Report to: Admitting (On your left hand side)    Do not eat solid food past midnight the night before your procedure.(To include any tube feedings-must be discontinued)  May have the following until  4:30 AM day of procedure  CLEAR LIQUID DIET  Water Black Coffee (sugar ok, NO MILK/CREAM OR CREAMERS)  Tea (sugar ok, NO MILK/CREAM OR CREAMERS) regular and decaf                             Plain Jell-O (NO RED)                                           Fruit ices (not with fruit pulp, NO RED)                                     Popsicles (NO RED)                                                                  Juice: apple, WHITE grape, WHITE cranberry Sports drinks like Gatorade (NO RED)  Stop smoking as many days as possible before surgery. No smoking after midnight night before surgery.   Take these morning medications only with sips of water.(or give through gastrostomy or feeding tube).  Gabapentin Allopurinol Pantoprazole Tylenol if needed Tramadol if needed Zofran if needed  Stop all vitamins and herbal supplements 7 days before surgery.   Note: No  Insulin or Diabetic meds should be given or taken the morning of the procedure!  Oral Hygiene is also important to reduce your risk of infection.                                    Remember - BRUSH YOUR TEETH THE MORNING OF SURGERY WITH YOUR REGULAR TOOTHPASTE   DENTURES WILL BE REMOVED PRIOR TO  SURGERY PLEASE DO NOT APPLY "Poly grip" OR ADHESIVES!!!  Leave all jewelry and other valuables at place where living( no metal or rings to be worn) No contact lens  Men-no colognes,lotions   Any questions day of procedure,call  SHORT [phone]     Sent from :[facility]                   [phone]                   Fax:[phone]   Sent by :   Duke Weisensel BSN, RN

## 2022-12-19 NOTE — Progress Notes (Signed)
For Anesthesia: PCP - Melanie Crazier, MD  Cardiologist - N/A  Chest x-ray - N/A EKG - 06/27/22 in [facility] Stress Test - N/A ECHO - N/A Cardiac Cath - N/A Pacemaker/ICD device last checked: N/A Pacemaker orders received: N/A Device Rep notified: N/A  Spinal Cord Stimulator: N/A  Sleep Study - N/A CPAP - N/A  Fasting Blood Sugar - N/A Checks Blood Sugar ___N/A__ times a day Date and result of last Hgb A1c-N/A  Last dose of GLP1 agonist- N/A GLP1 instructions: N/A  Last dose of SGLT-2 inhibitors- N/A SGLT-2 instructions: N/A  Blood Thinner Instructions:N/A Aspirin Instructions: N/A Last Dose: N/A  Activity level:  Unable to go up a flight of stairs without chest pain and/or shortness of breath     Anesthesia review: N/A  Patient denies shortness of breath, fever, cough and chest pain during pre op phone call.   Patient verbalized understanding of instructions reviewed via telephone.

## 2022-12-20 ENCOUNTER — Encounter (HOSPITAL_COMMUNITY): Payer: Self-pay | Admitting: General Surgery

## 2022-12-20 NOTE — Anesthesia Preprocedure Evaluation (Signed)
Anesthesia Evaluation  Patient identified by MRN, date of birth, ID band Patient awake    Reviewed: Allergy & Precautions, NPO status , Patient's Chart, lab work & pertinent test results  Airway Mallampati: III       Dental  (+) Dental Advisory Given, Chipped, Missing, Poor Dentition   Pulmonary Current Smoker   Pulmonary exam normal breath sounds clear to auscultation       Cardiovascular negative cardio ROS Normal cardiovascular exam Rhythm:Regular Rate:Normal     Neuro/Psych Peripheral neuropathy  Neuromuscular disease  negative psych ROS   GI/Hepatic Neg liver ROS, hiatal hernia, PUD,GERD  Medicated,,Cholelithiasis with chronic cholecystitis   Endo/Other    Morbid obesityGout  Renal/GU negative Renal ROS  negative genitourinary   Musculoskeletal  (+) Arthritis , Osteoarthritis,    Abdominal  (+) + obese  Peds  Hematology  (+) Blood dyscrasia, anemia   Anesthesia Other Findings   Reproductive/Obstetrics                             Anesthesia Physical Anesthesia Plan  ASA: 3  Anesthesia Plan: General   Post-op Pain Management: Dilaudid IV, Precedex and Tylenol PO (pre-op)*   Induction: Intravenous and Cricoid pressure planned  PONV Risk Score and Plan: 3 and Treatment may vary due to age or medical condition, Midazolam, Ondansetron and Dexamethasone  Airway Management Planned: Oral ETT  Additional Equipment: None  Intra-op Plan:   Post-operative Plan: Extubation in OR  Informed Consent: I have reviewed the patients History and Physical, chart, labs and discussed the procedure including the risks, benefits and alternatives for the proposed anesthesia with the patient or authorized representative who has indicated his/her understanding and acceptance.     Dental advisory given  Plan Discussed with: CRNA and Anesthesiologist  Anesthesia Plan Comments:         Anesthesia Quick Evaluation

## 2022-12-21 ENCOUNTER — Ambulatory Visit (HOSPITAL_COMMUNITY): Payer: Medicaid Other | Admitting: Anesthesiology

## 2022-12-21 ENCOUNTER — Encounter (HOSPITAL_COMMUNITY): Payer: Self-pay | Admitting: General Surgery

## 2022-12-21 ENCOUNTER — Other Ambulatory Visit: Payer: Self-pay

## 2022-12-21 ENCOUNTER — Encounter (HOSPITAL_COMMUNITY)
Admission: RE | Disposition: A | Discharge status: Skilled Nursing Facility | Payer: Self-pay | Source: Skilled Nursing Facility | Attending: General Surgery

## 2022-12-21 ENCOUNTER — Inpatient Hospital Stay (HOSPITAL_COMMUNITY)
Admission: RE | Admit: 2022-12-21 | Discharge: 2023-01-12 | DRG: 417 | Disposition: A | Discharge status: Skilled Nursing Facility | Payer: Medicaid Other | Source: Skilled Nursing Facility | Attending: General Surgery | Admitting: General Surgery

## 2022-12-21 ENCOUNTER — Ambulatory Visit (HOSPITAL_BASED_OUTPATIENT_CLINIC_OR_DEPARTMENT_OTHER): Payer: Medicaid Other | Admitting: Anesthesiology

## 2022-12-21 DIAGNOSIS — K279 Peptic ulcer, site unspecified, unspecified as acute or chronic, without hemorrhage or perforation: Secondary | ICD-10-CM | POA: Diagnosis present

## 2022-12-21 DIAGNOSIS — Z881 Allergy status to other antibiotic agents status: Secondary | ICD-10-CM

## 2022-12-21 DIAGNOSIS — B9562 Methicillin resistant Staphylococcus aureus infection as the cause of diseases classified elsewhere: Secondary | ICD-10-CM | POA: Diagnosis not present

## 2022-12-21 DIAGNOSIS — Z8719 Personal history of other diseases of the digestive system: Secondary | ICD-10-CM

## 2022-12-21 DIAGNOSIS — E8809 Other disorders of plasma-protein metabolism, not elsewhere classified: Secondary | ICD-10-CM | POA: Diagnosis not present

## 2022-12-21 DIAGNOSIS — E872 Acidosis, unspecified: Secondary | ICD-10-CM | POA: Diagnosis not present

## 2022-12-21 DIAGNOSIS — I4891 Unspecified atrial fibrillation: Secondary | ICD-10-CM | POA: Diagnosis not present

## 2022-12-21 DIAGNOSIS — D3502 Benign neoplasm of left adrenal gland: Secondary | ICD-10-CM | POA: Diagnosis present

## 2022-12-21 DIAGNOSIS — Z789 Other specified health status: Secondary | ICD-10-CM

## 2022-12-21 DIAGNOSIS — Z880 Allergy status to penicillin: Secondary | ICD-10-CM

## 2022-12-21 DIAGNOSIS — K8010 Calculus of gallbladder with chronic cholecystitis without obstruction: Secondary | ICD-10-CM | POA: Diagnosis not present

## 2022-12-21 DIAGNOSIS — R34 Anuria and oliguria: Secondary | ICD-10-CM | POA: Diagnosis not present

## 2022-12-21 DIAGNOSIS — Z833 Family history of diabetes mellitus: Secondary | ICD-10-CM

## 2022-12-21 DIAGNOSIS — M109 Gout, unspecified: Secondary | ICD-10-CM | POA: Diagnosis present

## 2022-12-21 DIAGNOSIS — Z713 Dietary counseling and surveillance: Secondary | ICD-10-CM

## 2022-12-21 DIAGNOSIS — E876 Hypokalemia: Secondary | ICD-10-CM | POA: Diagnosis not present

## 2022-12-21 DIAGNOSIS — Z6841 Body Mass Index (BMI) 40.0 and over, adult: Secondary | ICD-10-CM

## 2022-12-21 DIAGNOSIS — Q5564 Hidden penis: Secondary | ICD-10-CM

## 2022-12-21 DIAGNOSIS — E662 Morbid (severe) obesity with alveolar hypoventilation: Secondary | ICD-10-CM | POA: Diagnosis present

## 2022-12-21 DIAGNOSIS — J9601 Acute respiratory failure with hypoxia: Secondary | ICD-10-CM

## 2022-12-21 DIAGNOSIS — K9189 Other postprocedural complications and disorders of digestive system: Secondary | ICD-10-CM | POA: Diagnosis not present

## 2022-12-21 DIAGNOSIS — Z886 Allergy status to analgesic agent status: Secondary | ICD-10-CM

## 2022-12-21 DIAGNOSIS — Z8711 Personal history of peptic ulcer disease: Secondary | ICD-10-CM

## 2022-12-21 DIAGNOSIS — R509 Fever, unspecified: Secondary | ICD-10-CM

## 2022-12-21 DIAGNOSIS — N179 Acute kidney failure, unspecified: Secondary | ICD-10-CM | POA: Insufficient documentation

## 2022-12-21 DIAGNOSIS — K254 Chronic or unspecified gastric ulcer with hemorrhage: Secondary | ICD-10-CM | POA: Diagnosis present

## 2022-12-21 DIAGNOSIS — D6489 Other specified anemias: Secondary | ICD-10-CM | POA: Diagnosis not present

## 2022-12-21 DIAGNOSIS — Z593 Problems related to living in residential institution: Secondary | ICD-10-CM

## 2022-12-21 DIAGNOSIS — K7581 Nonalcoholic steatohepatitis (NASH): Secondary | ICD-10-CM | POA: Diagnosis present

## 2022-12-21 DIAGNOSIS — Z66 Do not resuscitate: Secondary | ICD-10-CM | POA: Diagnosis not present

## 2022-12-21 DIAGNOSIS — R6521 Severe sepsis with septic shock: Secondary | ICD-10-CM | POA: Diagnosis not present

## 2022-12-21 DIAGNOSIS — I517 Cardiomegaly: Secondary | ICD-10-CM | POA: Diagnosis present

## 2022-12-21 DIAGNOSIS — R5381 Other malaise: Secondary | ICD-10-CM | POA: Diagnosis present

## 2022-12-21 DIAGNOSIS — R7303 Prediabetes: Secondary | ICD-10-CM | POA: Diagnosis present

## 2022-12-21 DIAGNOSIS — Z823 Family history of stroke: Secondary | ICD-10-CM

## 2022-12-21 DIAGNOSIS — K811 Chronic cholecystitis: Secondary | ICD-10-CM | POA: Diagnosis present

## 2022-12-21 DIAGNOSIS — Z5320 Procedure and treatment not carried out because of patient's decision for unspecified reasons: Secondary | ICD-10-CM | POA: Diagnosis not present

## 2022-12-21 DIAGNOSIS — F17210 Nicotine dependence, cigarettes, uncomplicated: Secondary | ICD-10-CM | POA: Diagnosis present

## 2022-12-21 DIAGNOSIS — K651 Peritoneal abscess: Secondary | ICD-10-CM | POA: Diagnosis not present

## 2022-12-21 DIAGNOSIS — R188 Other ascites: Principal | ICD-10-CM

## 2022-12-21 DIAGNOSIS — L304 Erythema intertrigo: Secondary | ICD-10-CM | POA: Diagnosis not present

## 2022-12-21 DIAGNOSIS — R339 Retention of urine, unspecified: Secondary | ICD-10-CM | POA: Diagnosis not present

## 2022-12-21 DIAGNOSIS — Y838 Other surgical procedures as the cause of abnormal reaction of the patient, or of later complication, without mention of misadventure at the time of the procedure: Secondary | ICD-10-CM | POA: Diagnosis not present

## 2022-12-21 DIAGNOSIS — N3289 Other specified disorders of bladder: Secondary | ICD-10-CM | POA: Diagnosis not present

## 2022-12-21 DIAGNOSIS — K219 Gastro-esophageal reflux disease without esophagitis: Secondary | ICD-10-CM | POA: Diagnosis present

## 2022-12-21 DIAGNOSIS — Z79899 Other long term (current) drug therapy: Secondary | ICD-10-CM

## 2022-12-21 DIAGNOSIS — Z882 Allergy status to sulfonamides status: Secondary | ICD-10-CM

## 2022-12-21 DIAGNOSIS — R739 Hyperglycemia, unspecified: Secondary | ICD-10-CM | POA: Diagnosis not present

## 2022-12-21 DIAGNOSIS — Z8249 Family history of ischemic heart disease and other diseases of the circulatory system: Secondary | ICD-10-CM

## 2022-12-21 DIAGNOSIS — E871 Hypo-osmolality and hyponatremia: Secondary | ICD-10-CM

## 2022-12-21 DIAGNOSIS — E8770 Fluid overload, unspecified: Secondary | ICD-10-CM | POA: Diagnosis not present

## 2022-12-21 DIAGNOSIS — J9811 Atelectasis: Secondary | ICD-10-CM | POA: Diagnosis not present

## 2022-12-21 DIAGNOSIS — R001 Bradycardia, unspecified: Secondary | ICD-10-CM | POA: Diagnosis not present

## 2022-12-21 DIAGNOSIS — Z888 Allergy status to other drugs, medicaments and biological substances status: Secondary | ICD-10-CM

## 2022-12-21 DIAGNOSIS — L299 Pruritus, unspecified: Secondary | ICD-10-CM | POA: Diagnosis not present

## 2022-12-21 DIAGNOSIS — R197 Diarrhea, unspecified: Secondary | ICD-10-CM | POA: Diagnosis not present

## 2022-12-21 DIAGNOSIS — J9621 Acute and chronic respiratory failure with hypoxia: Secondary | ICD-10-CM | POA: Diagnosis not present

## 2022-12-21 DIAGNOSIS — A419 Sepsis, unspecified organism: Secondary | ICD-10-CM

## 2022-12-21 DIAGNOSIS — Z7401 Bed confinement status: Secondary | ICD-10-CM

## 2022-12-21 HISTORY — PX: CHOLECYSTECTOMY: SHX55

## 2022-12-21 LAB — CREATININE, SERUM
Creatinine, Ser: 0.71 mg/dL (ref 0.61–1.24)
GFR, Estimated: >60 mL/min (ref >60)

## 2022-12-21 LAB — CBC
HCT: 45.3 % (ref 39.0–52.0)
HCT: 49 % (ref 39.0–52.0)
Hemoglobin: 14.3 g/dL (ref 13.0–17.0)
Hemoglobin: 15.6 g/dL (ref 13.0–17.0)
MCH: 27.9 pg (ref 26.0–34.0)
MCH: 28.7 pg (ref 26.0–34.0)
MCHC: 31.6 g/dL (ref 30.0–36.0)
MCHC: 31.8 g/dL (ref 30.0–36.0)
MCV: 88.5 fL (ref 80.0–100.0)
MCV: 90.2 fL (ref 80.0–100.0)
Platelets: 269 10*3/uL (ref 150–400)
Platelets: 266 10*3/uL (ref 150–400)
RBC: 5.12 MIL/uL (ref 4.22–5.81)
RBC: 5.43 MIL/uL (ref 4.22–5.81)
RDW: 13.2 % (ref 11.5–15.5)
RDW: 13.3 % (ref 11.5–15.5)
WBC: 8.6 10*3/uL (ref 4.0–10.5)
WBC: 15.7 10*3/uL — ABNORMAL HIGH (ref 4.0–10.5)
nRBC: 0 % (ref 0.0–0.2)
nRBC: 0 % (ref 0.0–0.2)

## 2022-12-21 SURGERY — LAPAROSCOPIC CHOLECYSTECTOMY
Anesthesia: General | Site: Abdomen

## 2022-12-21 MED ORDER — KETOROLAC TROMETHAMINE 15 MG/ML IJ SOLN: 15.0000 mg | Freq: Three times a day (TID) | INTRAMUSCULAR | Status: DC

## 2022-12-21 MED ORDER — GABAPENTIN 300 MG PO CAPS
300.0000 mg | ORAL_CAPSULE | Freq: Four times a day (QID) | ORAL | Status: DC
  Administered 2022-12-21 – 2022-12-28 (×28): 300 mg via ORAL
  Filled 2022-12-21 (×5): qty 3
  Filled 2022-12-21: qty 1
  Filled 2022-12-21 (×20): qty 3
  Filled 2022-12-21: qty 1
  Filled 2022-12-21 (×2): qty 3

## 2022-12-21 MED ORDER — SUCCINYLCHOLINE CHLORIDE 200 MG/10ML IV SOSY
PREFILLED_SYRINGE | INTRAVENOUS | Status: DC | PRN
Start: 2022-12-21 — End: 2022-12-21
  Administered 2022-12-21: 180 mg via INTRAVENOUS

## 2022-12-21 MED ORDER — LIDOCAINE HCL (PF) 2 % IJ SOLN
INTRAMUSCULAR | Status: AC
  Filled 2022-12-21: qty 5

## 2022-12-21 MED ORDER — SUCCINYLCHOLINE CHLORIDE 200 MG/10ML IV SOSY
PREFILLED_SYRINGE | INTRAVENOUS | Status: AC
  Filled 2022-12-21: qty 10

## 2022-12-21 MED ORDER — ACETAMINOPHEN 500 MG PO TABS
1000.0000 mg | ORAL_TABLET | ORAL | Status: AC
  Administered 2022-12-21: 1000 mg via ORAL
  Filled 2022-12-21: qty 2

## 2022-12-21 MED ORDER — OXYCODONE HCL 5 MG/5ML PO SOLN: 5.0000 mg | Freq: Once | ORAL | Status: DC | PRN

## 2022-12-21 MED ORDER — CEFAZOLIN SODIUM-DEXTROSE 2-4 GM/100ML-% IV SOLN
2.0000 g | INTRAVENOUS | Status: DC
  Filled 2022-12-21: qty 100

## 2022-12-21 MED ORDER — DEXTROSE 5 % IV SOLN
INTRAVENOUS | Status: DC | PRN
  Administered 2022-12-21: 3 g via INTRAVENOUS

## 2022-12-21 MED ORDER — KETOROLAC TROMETHAMINE 15 MG/ML IJ SOLN
15.0000 mg | Freq: Three times a day (TID) | INTRAMUSCULAR | Status: AC
  Administered 2022-12-21 – 2022-12-26 (×15): 15 mg via INTRAVENOUS
  Filled 2022-12-21 (×15): qty 1

## 2022-12-21 MED ORDER — FENTANYL CITRATE (PF) 100 MCG/2ML IJ SOLN
INTRAMUSCULAR | Status: AC
  Filled 2022-12-21: qty 2

## 2022-12-21 MED ORDER — BUPIVACAINE LIPOSOME 1.3 % IJ SUSP
INTRAMUSCULAR | Status: AC
  Filled 2022-12-21: qty 20

## 2022-12-21 MED ORDER — FENTANYL CITRATE (PF) 100 MCG/2ML IJ SOLN
INTRAMUSCULAR | Status: DC | PRN
  Administered 2022-12-21 (×3): 50 ug via INTRAVENOUS

## 2022-12-21 MED ORDER — LIDOCAINE 5 % EX PTCH
1.0000 | MEDICATED_PATCH | Freq: Every day | CUTANEOUS | Status: DC
  Administered 2022-12-21 – 2022-12-24 (×4): 1 via TRANSDERMAL
  Filled 2022-12-21 (×13): qty 1

## 2022-12-21 MED ORDER — HEMOSTATIC AGENTS (NO CHARGE) OPTIME
TOPICAL | Status: DC | PRN
Start: 2022-12-21 — End: 2022-12-21
  Administered 2022-12-21: 1 via TOPICAL

## 2022-12-21 MED ORDER — MELATONIN 3 MG PO TABS: 3.0000 mg | ORAL_TABLET | Freq: Every evening | ORAL | Status: DC | PRN

## 2022-12-21 MED ORDER — ENOXAPARIN SODIUM 40 MG/0.4ML IJ SOSY
40.0000 mg | PREFILLED_SYRINGE | INTRAMUSCULAR | Status: DC
  Administered 2022-12-22 – 2022-12-29 (×8): 40 mg via SUBCUTANEOUS
  Filled 2022-12-21 (×8): qty 0.4

## 2022-12-21 MED ORDER — METOPROLOL TARTRATE 5 MG/5ML IV SOLN: 5.0000 mg | Freq: Four times a day (QID) | INTRAVENOUS | Status: DC | PRN

## 2022-12-21 MED ORDER — LACTATED RINGERS IV SOLN: INTRAVENOUS | Status: DC

## 2022-12-21 MED ORDER — ROCURONIUM BROMIDE 100 MG/10ML IV SOLN
INTRAVENOUS | Status: DC | PRN
  Administered 2022-12-21: 50 mg via INTRAVENOUS
  Administered 2022-12-21: 15 mg via INTRAVENOUS
  Administered 2022-12-21: 65 mg via INTRAVENOUS

## 2022-12-21 MED ORDER — ONDANSETRON HCL 4 MG/2ML IJ SOLN
4.0000 mg | Freq: Four times a day (QID) | INTRAMUSCULAR | Status: DC | PRN
  Administered 2022-12-28: 4 mg via INTRAVENOUS
  Filled 2022-12-21: qty 2

## 2022-12-21 MED ORDER — SUGAMMADEX SODIUM 200 MG/2ML IV SOLN
INTRAVENOUS | Status: DC | PRN
  Administered 2022-12-21: 500 mg via INTRAVENOUS

## 2022-12-21 MED ORDER — CHLORHEXIDINE GLUCONATE CLOTH 2 % EX PADS: 6.0000 | MEDICATED_PAD | Freq: Once | CUTANEOUS | Status: DC

## 2022-12-21 MED ORDER — OXYCODONE HCL 5 MG PO TABS
5.0000 mg | ORAL_TABLET | ORAL | Status: DC | PRN
  Administered 2022-12-21 – 2022-12-24 (×9): 10 mg via ORAL
  Administered 2022-12-25: 5 mg via ORAL
  Administered 2022-12-25 – 2022-12-27 (×7): 10 mg via ORAL
  Filled 2022-12-21 (×19): qty 2

## 2022-12-21 MED ORDER — ORAL CARE MOUTH RINSE: 15.0000 mL | Freq: Once | OROMUCOSAL | Status: AC

## 2022-12-21 MED ORDER — ONDANSETRON HCL 4 MG/2ML IJ SOLN: 4.0000 mg | Freq: Once | INTRAMUSCULAR | Status: DC | PRN

## 2022-12-21 MED ORDER — MIDAZOLAM HCL 2 MG/2ML IJ SOLN
INTRAMUSCULAR | Status: AC
  Filled 2022-12-21: qty 2

## 2022-12-21 MED ORDER — HYDROMORPHONE HCL 1 MG/ML IJ SOLN
1.0000 mg | INTRAMUSCULAR | Status: DC | PRN
  Administered 2022-12-21 (×2): 1 mg via INTRAVENOUS
  Filled 2022-12-21 (×2): qty 1

## 2022-12-21 MED ORDER — HYDROMORPHONE HCL 1 MG/ML IJ SOLN
0.2500 mg | INTRAMUSCULAR | Status: DC | PRN
  Administered 2022-12-21 (×3): 0.5 mg via INTRAVENOUS

## 2022-12-21 MED ORDER — PROPOFOL 10 MG/ML IV BOLUS
INTRAVENOUS | Status: AC
  Filled 2022-12-21: qty 20

## 2022-12-21 MED ORDER — ROCURONIUM BROMIDE 10 MG/ML (PF) SYRINGE
PREFILLED_SYRINGE | INTRAVENOUS | Status: AC
  Filled 2022-12-21: qty 10

## 2022-12-21 MED ORDER — LIDOCAINE HCL (CARDIAC) PF 100 MG/5ML IV SOSY
PREFILLED_SYRINGE | INTRAVENOUS | Status: DC | PRN
  Administered 2022-12-21: 100 mg via INTRAVENOUS

## 2022-12-21 MED ORDER — LACTATED RINGERS IR SOLN
Status: DC | PRN
Start: 2022-12-21 — End: 2022-12-21
  Administered 2022-12-21: 1000 mL

## 2022-12-21 MED ORDER — 0.9 % SODIUM CHLORIDE (POUR BTL) OPTIME
TOPICAL | Status: DC | PRN
Start: 2022-12-21 — End: 2022-12-21
  Administered 2022-12-21: 1000 mL

## 2022-12-21 MED ORDER — DIPHENHYDRAMINE HCL 50 MG/ML IJ SOLN: 25.0000 mg | Freq: Four times a day (QID) | INTRAMUSCULAR | Status: DC | PRN

## 2022-12-21 MED ORDER — SPY AGENT GREEN - (INDOCYANINE FOR INJECTION)
1.2500 mg | Freq: Once | INTRAMUSCULAR | Status: AC
  Administered 2022-12-21: 1.25 mg via INTRAVENOUS
  Filled 2022-12-21: qty 10

## 2022-12-21 MED ORDER — BUPIVACAINE-EPINEPHRINE (PF) 0.5% -1:200000 IJ SOLN
INTRAMUSCULAR | Status: DC | PRN
  Administered 2022-12-21: 50 mL

## 2022-12-21 MED ORDER — ALLOPURINOL 100 MG PO TABS
100.0000 mg | ORAL_TABLET | Freq: Every day | ORAL | Status: DC
  Administered 2022-12-21 – 2023-01-12 (×21): 100 mg via ORAL
  Filled 2022-12-21 (×22): qty 1

## 2022-12-21 MED ORDER — ONDANSETRON HCL 4 MG/2ML IJ SOLN
INTRAMUSCULAR | Status: DC | PRN
  Administered 2022-12-21: 4 mg via INTRAVENOUS

## 2022-12-21 MED ORDER — ACETAMINOPHEN 500 MG PO TABS
1000.0000 mg | ORAL_TABLET | Freq: Three times a day (TID) | ORAL | Status: DC
  Administered 2022-12-21 – 2022-12-28 (×20): 1000 mg via ORAL
  Filled 2022-12-21 (×21): qty 2

## 2022-12-21 MED ORDER — PROPOFOL 10 MG/ML IV BOLUS
INTRAVENOUS | Status: DC | PRN
  Administered 2022-12-21: 350 mg via INTRAVENOUS

## 2022-12-21 MED ORDER — ONDANSETRON 4 MG PO TBDP
4.0000 mg | ORAL_TABLET | Freq: Four times a day (QID) | ORAL | Status: DC | PRN
  Administered 2023-01-11: 4 mg via ORAL
  Filled 2022-12-21 (×2): qty 1

## 2022-12-21 MED ORDER — CEFAZOLIN IN SODIUM CHLORIDE 3-0.9 GM/100ML-% IV SOLN
INTRAVENOUS | Status: AC
  Filled 2022-12-21: qty 100

## 2022-12-21 MED ORDER — HYDROMORPHONE HCL 1 MG/ML IJ SOLN
1.0000 mg | INTRAMUSCULAR | Status: DC | PRN
  Administered 2022-12-21 – 2022-12-27 (×20): 1 mg via INTRAVENOUS
  Filled 2022-12-21 (×21): qty 1

## 2022-12-21 MED ORDER — BUPIVACAINE-EPINEPHRINE 0.25% -1:200000 IJ SOLN
INTRAMUSCULAR | Status: AC
  Filled 2022-12-21: qty 1

## 2022-12-21 MED ORDER — HYDROCORTISONE (PERIANAL) 2.5 % EX CREA
1.0000 | TOPICAL_CREAM | Freq: Two times a day (BID) | CUTANEOUS | Status: DC
  Administered 2022-12-23 – 2023-01-07 (×6): 1 via RECTAL
  Filled 2022-12-21 (×3): qty 28.35

## 2022-12-21 MED ORDER — AMISULPRIDE (ANTIEMETIC) 5 MG/2ML IV SOLN: 10.0000 mg | Freq: Once | INTRAVENOUS | Status: DC | PRN

## 2022-12-21 MED ORDER — OXYCODONE HCL 5 MG PO TABS: 5.0000 mg | ORAL_TABLET | Freq: Once | ORAL | Status: DC | PRN

## 2022-12-21 MED ORDER — DIPHENHYDRAMINE HCL 25 MG PO CAPS: 25.0000 mg | ORAL_CAPSULE | Freq: Four times a day (QID) | ORAL | Status: DC | PRN

## 2022-12-21 MED ORDER — METHOCARBAMOL 1000 MG/10ML IJ SOLN
1000.0000 mg | Freq: Four times a day (QID) | INTRAVENOUS | Status: DC
  Administered 2022-12-21 – 2022-12-28 (×28): 1000 mg via INTRAVENOUS
  Filled 2022-12-21 (×2): qty 1000
  Filled 2022-12-21: qty 10
  Filled 2022-12-21 (×3): qty 1000
  Filled 2022-12-21 (×2): qty 10
  Filled 2022-12-21: qty 1000
  Filled 2022-12-21: qty 10
  Filled 2022-12-21 (×7): qty 1000
  Filled 2022-12-21 (×2): qty 10
  Filled 2022-12-21 (×3): qty 1000
  Filled 2022-12-21 (×2): qty 10
  Filled 2022-12-21: qty 1000
  Filled 2022-12-21: qty 10
  Filled 2022-12-21 (×2): qty 1000
  Filled 2022-12-21: qty 10
  Filled 2022-12-21: qty 1000
  Filled 2022-12-21: qty 10
  Filled 2022-12-21: qty 1000
  Filled 2022-12-21: qty 10

## 2022-12-21 MED ORDER — CHLORHEXIDINE GLUCONATE 0.12 % MT SOLN
15.0000 mL | Freq: Once | OROMUCOSAL | Status: AC
  Administered 2022-12-21: 15 mL via OROMUCOSAL

## 2022-12-21 MED ORDER — HYDROMORPHONE HCL 1 MG/ML IJ SOLN
INTRAMUSCULAR | Status: AC
  Administered 2022-12-21: 0.5 mg via INTRAVENOUS
  Filled 2022-12-21: qty 2

## 2022-12-21 MED ORDER — PANTOPRAZOLE SODIUM 40 MG PO TBEC
40.0000 mg | DELAYED_RELEASE_TABLET | Freq: Two times a day (BID) | ORAL | Status: DC
  Administered 2022-12-21 – 2022-12-28 (×15): 40 mg via ORAL
  Filled 2022-12-21 (×15): qty 1

## 2022-12-21 SURGICAL SUPPLY — 53 items
APL PRP STRL LF DISP 70% ISPRP (MISCELLANEOUS) ×2
APL SKNCLS STERI-STRIP NONHPOA (GAUZE/BANDAGES/DRESSINGS)
APPLIER CLIP 5 13 M/L LIGAMAX5 (MISCELLANEOUS) ×1
APPLIER CLIP ROT 10 11.4 M/L (STAPLE)
APR CLP MED LRG 11.4X10 (STAPLE)
APR CLP MED LRG 5 ANG JAW (MISCELLANEOUS) ×1
BAG COUNTER SPONGE SURGICOUNT (BAG) IMPLANT
BAG SPEC RTRVL 10 TROC 200 (ENDOMECHANICALS) ×1
BAG SPNG CNTER NS LX DISP (BAG)
BENZOIN TINCTURE PRP APPL 2/3 (GAUZE/BANDAGES/DRESSINGS) IMPLANT
BNDG ADH 1X3 SHEER STRL LF (GAUZE/BANDAGES/DRESSINGS) IMPLANT
BNDG ADH THN 3X1 STRL LF (GAUZE/BANDAGES/DRESSINGS)
CABLE HIGH FREQUENCY MONO STRZ (ELECTRODE) ×1 IMPLANT
CHLORAPREP W/TINT 26 (MISCELLANEOUS) ×1 IMPLANT
CLIP APPLIE 5 13 M/L LIGAMAX5 (MISCELLANEOUS) ×1 IMPLANT
CLIP APPLIE ROT 10 11.4 M/L (STAPLE) IMPLANT
CLIP LIGATING HEM O LOK PURPLE (MISCELLANEOUS) IMPLANT
CLIP LIGATING HEMO O LOK GREEN (MISCELLANEOUS) ×1 IMPLANT
COVER MAYO STAND XLG (MISCELLANEOUS) ×1 IMPLANT
COVER SURGICAL LIGHT HANDLE (MISCELLANEOUS) ×1 IMPLANT
DRAIN CHANNEL 19F RND (DRAIN) IMPLANT
DRAIN RELI 100 BL SUC LF ST (DRAIN)
DRAPE C-ARM 42X120 X-RAY (DRAPES) IMPLANT
EVACUATOR SILICONE 100CC (DRAIN) IMPLANT
GLOVE BIOGEL PI IND STRL 7.0 (GLOVE) ×1 IMPLANT
GLOVE SURG SS PI 7.0 STRL IVOR (GLOVE) ×1 IMPLANT
GOWN STRL REUS W/ TWL LRG LVL3 (GOWN DISPOSABLE) ×1 IMPLANT
GOWN STRL REUS W/TWL LRG LVL3 (GOWN DISPOSABLE) ×1
GRASPER SUT TROCAR 14GX15 (MISCELLANEOUS) ×1 IMPLANT
HEMOSTAT SNOW SURGICEL 2X4 (HEMOSTASIS) IMPLANT
IRRIG SUCT STRYKERFLOW 2 WTIP (MISCELLANEOUS) ×2
IRRIGATION SUCT STRKRFLW 2 WTP (MISCELLANEOUS) ×1 IMPLANT
KIT BASIN OR (CUSTOM PROCEDURE TRAY) ×1 IMPLANT
KIT TURNOVER KIT A (KITS) IMPLANT
NDL SPNL 22GX3.5 QUINCKE BK (NEEDLE) IMPLANT
NEEDLE SPNL 22GX3.5 QUINCKE BK (NEEDLE) ×1 IMPLANT
POUCH RETRIEVAL ECOSAC 10 (ENDOMECHANICALS) ×1 IMPLANT
SCISSORS LAP 5X35 DISP (ENDOMECHANICALS) ×1 IMPLANT
SET CHOLANGIOGRAPH MIX (MISCELLANEOUS) IMPLANT
SET TUBE SMOKE EVAC HIGH FLOW (TUBING) ×1 IMPLANT
SLEEVE ADV FIXATION 12X100MM (TROCAR) IMPLANT
SLEEVE Z-THREAD 5X100MM (TROCAR) ×2 IMPLANT
SPIKE FLUID TRANSFER (MISCELLANEOUS) ×1 IMPLANT
STRIP CLOSURE SKIN 1/2X4 (GAUZE/BANDAGES/DRESSINGS) IMPLANT
SUT ETHILON 2 0 PS N (SUTURE) IMPLANT
SUT MNCRL AB 4-0 PS2 18 (SUTURE) ×1 IMPLANT
SUT VICRYL 0 ENDOLOOP (SUTURE) IMPLANT
TOWEL OR 17X26 10 PK STRL BLUE (TOWEL DISPOSABLE) ×1 IMPLANT
TOWEL OR NON WOVEN STRL DISP B (DISPOSABLE) IMPLANT
TRAY LAPAROSCOPIC (CUSTOM PROCEDURE TRAY) ×1 IMPLANT
TROCAR 5M 150ML BLDLS (TROCAR) IMPLANT
TROCAR ADV FIXATION 12X100MM (TROCAR) ×1 IMPLANT
TROCAR Z-THREAD OPTICAL 5X100M (TROCAR) ×1 IMPLANT

## 2022-12-21 NOTE — H&P (Signed)
hief Complaint: Cholelithiasis   History of Present Illness: [name] is a 37 y.o. male who is seen today as an office consultation at the request of Dr. Benjamine Mola for evaluation of Cholelithiasis .   He has been having intermittent abdominal pain. Pain can last for several hours. He has nausea. He lives in a nursing facility. He has back problems and was walking a few months ago but has been immobile recently.  Review of Systems: A complete review of systems was obtained from the patient. I have reviewed this information and discussed as appropriate with the patient. See HPI as well for other ROS.  Review of Systems  Constitutional: Negative.  HENT: Negative.  Eyes: Negative.  Respiratory: Negative.  Cardiovascular: Negative.  Gastrointestinal: Positive for abdominal pain, nausea and vomiting.  Genitourinary: Negative.  Musculoskeletal: Negative.  Skin: Negative.  Neurological: Negative.  Endo/Heme/Allergies: Negative.  Psychiatric/Behavioral: Negative.   Medical History: Past Medical History:  Diagnosis Date  Arthritis  Esophageal varices (CMS/HHS-HCC)  GERD (gastroesophageal reflux disease)   There is no problem list on file for this patient.  Past Surgical History:  Procedure Laterality Date  TONSILLECTOMY   Allergies  Allergen Reactions  Aspirin Other (See Comments)  Pt doesn't know reaction.  Clindamycin Nausea  Gastric ulcer   Gastric ulcer  Nsaids (Non-Steroidal Anti-Inflammatory Drug) Nausea  Causes Ulcers   Causes Ulcers  Penicillins Other (See Comments)  Pt doesn't know reaction. Has patient had a PCN reaction causing immediate rash, facial/tongue/throat swelling, SOB or lightheadedness with hypotension: No Has patient had a PCN reaction causing severe rash involving mucus membranes or skin necrosis: No Has patient had a PCN reaction that required hospitalization No Has patient had a PCN reaction occurring within the last 10 years: No If all of  the above answers are "NO", then may proceed with Cephalosporin use.  Pt doesn't know reaction., Has patient had a PCN reaction causing immediate rash, facial/tongue/throat swelling, SOB or lightheadedness with hypotension: No, Has patient had a PCN reaction causing severe rash involving mucus membranes or skin necrosis: No, Has patient had a PCN reaction that required hospitalization No, Has patient had a PCN reaction occurring within the last 10 years: No, If all of the above answers are "NO", then may proceed with Cephalosporin use.  Sulfa (Sulfonamide Antibiotics) Itching  Pt doesn't know reaction.   Current Outpatient Medications on File Prior to Visit  Medication Sig Dispense Refill  acetaminophen (TYLENOL) 500 MG tablet Take by mouth every 4 (four) hours as needed  albuterol (PROVENTIL) 2.5 mg /3 mL (0.083 %) nebulizer solution  allopurinoL (ZYLOPRIM) 100 MG tablet Take 100 mg by mouth once daily  azithromycin (ZITHROMAX) 250 MG tablet  colchicine (COLCRYS) 0.6 mg tablet  gabapentin (NEURONTIN) 300 MG capsule Take 300 mg by mouth 2 (two) times daily  hydrocortisone (ANUSOL-HC) 25 mg suppository Place 25 mg rectally 2 (two) times daily  ondansetron (ZOFRAN-ODT) 4 MG disintegrating tablet Take 4 mg by mouth every 8 (eight) hours as needed  pantoprazole (PROTONIX) 40 MG DR tablet Take 40 mg by mouth 2 (two) times daily before meals  traMADoL (ULTRAM) 50 mg tablet   No current facility-administered medications on file prior to visit.   Family History  Problem Relation Age of Onset  Stroke Mother  Obesity Mother  High blood pressure (Hypertension) Mother  Hyperlipidemia (Elevated cholesterol) Mother  Coronary Artery Disease (Blocked arteries around heart) Mother  Diabetes Mother  Obesity Father  High blood pressure (Hypertension) Father  Hyperlipidemia (  Elevated cholesterol) Father  Diabetes Father   Social History   Tobacco Use  Smoking Status Every Day  Types: Cigarettes   Smokeless Tobacco Never   Social History   Socioeconomic History  Marital status: Single  Tobacco Use  Smoking status: Every Day  Types: Cigarettes  Smokeless tobacco: Never  Substance and Sexual Activity  Alcohol use: Never  Drug use: Never   Social Determinants of Health   Food Insecurity: No Food Insecurity (08/17/2022)  Received from [facility]  Hunger Vital Sign  Worried About Running Out of Food in the Last Year: Never true  Ran Out of Food in the Last Year: Never true  Transportation Needs: No Transportation Needs (08/17/2022)  [facility] from [facility]  Lack of Transportation (Medical): No  Lack of Transportation (Non-Medical): No   Objective:   Vitals:  10/11/22 0943  BP: 135/85  Pulse: 87  Temp: 37.4 C (99.4 F)  Weight: (!) 199.6 kg (440 lb)  Height: 170.2 cm (5\' 7" )   Body mass index is 68.91 kg/m.  Physical Exam Constitutional:  Appearance: Normal appearance.  HENT:  Head: Normocephalic and atraumatic.  Pulmonary:  Effort: Pulmonary effort is normal.  Abdominal:  Comments: Tender in the epigastrium  Musculoskeletal:  General: Normal range of motion.  Cervical back: Normal range of motion.  Neurological:  General: No focal deficit present.  Mental Status: He is alert and oriented to person, place, and time. Mental status is at baseline.  Psychiatric:  Mood and Affect: Mood normal.  Behavior: Behavior normal.  Thought Content: Thought content normal.   Labs, Imaging and Diagnostic Testing: Abdominal ultrasound results were reviewed showing gallstones and gallbladder wall thickening I reviewed notes by Bethann Berkshire Assessment and Plan:  Diagnoses and all orders for this visit:  Calculus of gallbladder with chronic cholecystitis without obstruction  Morbid (severe) obesity due to excess calories (CMS/HHS-HCC)  [name] is a 37 y.o. male with evidence of gallbladder disease. We discussed the etiology of  gallstones and biliary disease and that can cause pain. We discussed exacerbating factors including fatty meals. We discussed the details of surgery for removal of the gallbladder including general anesthesia, 4 small incisions in the patient's abdomen, removal of the patient's gallbladder with the liver and common bile duct, and most likely an outpatient procedure. We discussed risks of common bile duct injury, cystic duct stump leak, injury to liver, bleeding, infection, need for open procedure, and post cholecystectomy syndrome. He showed good understanding and wanted to proceed with laparoscopic cholecystectomy.  Obesity will be treated with preoperative low carbohydrate diet.

## 2022-12-21 NOTE — Transfer of Care (Signed)
Immediate Anesthesia Transfer of Care Note  Patient: [name]  Procedure(s) Performed: LAPAROSCOPIC CHOLECYSTECTOMY (Abdomen)  Patient Location: PACU  Anesthesia Type:General  Level of Consciousness: awake and alert   Airway & Oxygen Therapy: Patient Spontanous Breathing and Patient connected to face mask oxygen  Post-op Assessment: Report given to RN and Post -op Vital signs reviewed and stable  Post vital signs: Reviewed and stable  Last Vitals:  Vitals Value Taken Time  BP 168/94 12/21/22 0951  Temp    Pulse 106 12/21/22 0954  Resp 20 12/21/22 0954  SpO2 94 % 12/21/22 0954  Vitals shown include unfiled device data.  Last Pain:  Vitals:   12/21/22 0611  TempSrc: Oral  PainSc:          Complications: No notable events documented.

## 2022-12-21 NOTE — Op Note (Signed)
Preoperative diagnosis: chronic calculous cholecystitis  Postoperative diagnosis: same   Procedure: laparoscopic partial cholecystectomy  Surgeon: Feliciana Rossetti, M.D.  Asst: Reatha Harps, due to anatomy and complexity of the case, Dr. Hillery Hunter was integral to completion of the procedure  Anesthesia: GETA  Indications for procedure: [name] is a 37 y.o. year old male with symptoms of abdominal pain with eating. His work up revealed large gallstones. He was brought to surgery.  Description of procedure: The patient was brought into the operative suite. Anesthesia was administered with General endotracheal anesthesia. WHO checklist was applied. The patient was then placed in supine. position. The area was prepped and draped in the usual sterile fashion.  Next, a small supraumbilical incision was made. A 5mm trocar was used to gain access to the peritoneal cavity by optical entry technique. Pneumoperitoneum was applied with a high flow and low pressure. The laparoscope was reinserted to confirm position.  Marcaine was used to anesthetize the subxiphoid space and perform a right sided TAP. 3 additional troacrs were placed. 1 12 trocar was placed in the subxiphoid area. 1 5 mm trocar was place din the right upper abdomen. 1 5 mm trocar was placed in the right lateral abdomen.  The liver was large and fatty. There was a large amount of visceral fat as well. The gallbladder was identified and attempted to retract, however, this proved difficulty due to the liver size. The peritoneum of the gallbladder body and dome were incised to try to help mobilization, this still did not allow safe visualization of the infundibulum or cystic triangle. Pneumoinsufflation was increased to 18 mm to help create space for visualization. Dr. Hillery Hunter came into the room to help with retraction for better visualization.   At this point I did not think I could safely dissect out the cystic triangle, therefore  decision was made to perform a partial cholecystectomy. The anterior wall was incised and a large gallstone came out. The gallstone was broken into smaller pieces and removed. The anterior wall and dome of the gallbladder were fully removed. The mucosa of the gallbladder was cauterized. There was some bleeding along the posterior wall of the gallbladder. Cautery and pressure were used to cause hemostasis. A raytech was introduced into the field to help with pressure. A valve of the trocar came off with the raytech and was retrieved from the abdomen. A new 12 mm trocar was placed in the same position. The raytech was removed.   Additional cautery was performed for hemostasis. Jamelle Haring was placed over the cauterized area. Suction was used to remove blood and stones and bile. A 19 Fr drain was placed into the abdomen with tip brought out the right upper quadrant area and sutured in place with 2-0 nylon. All trocars were removed. All skin incisions were closed with 4-0 monocryl subcuticular stitch. Dermabond was put in place for dressing. The patient awoke from anesthesia and was brought to PACU in stable condition. All counts were correct.  Findings: large fatty liver, difficult visualization  Specimen: stones and anterior wall of the gallbladder  Implant: 19 Fr blake drain   Blood loss: 150 ml  Local anesthesia:  30 ml marcaine   Complications: valve of the 12 mm trocar came off during introduction of a raytech sponge. Conversion of case to partial cholecystectomy due to visualization difficulty and inability to retract.  Feliciana Rossetti, M.D. General, Bariatric, & Minimally Invasive Surgery [facility]

## 2022-12-21 NOTE — Progress Notes (Signed)
   12/21/22 0752  OBSTRUCTIVE SLEEP APNEA  Have you ever been diagnosed with sleep apnea through a sleep study? No  Do you snore loudly (loud enough to be heard through closed doors)?  1  Do you often feel tired, fatigued, or sleepy during the daytime (such as falling asleep during driving or talking to someone)? 1  Has anyone observed you stop breathing during your sleep? 1  Do you have, or are you being treated for high blood pressure? 0  BMI more than 35 kg/m2? 1  Age > 50 (1-yes) 0  Neck circumference greater than:Male 16 inches or larger, Male 17inches or larger? 1  Male Gender (Yes=1) 1  Obstructive Sleep Apnea Score 6

## 2022-12-21 NOTE — Anesthesia Postprocedure Evaluation (Signed)
Anesthesia Post Note  Patient: [name]  Procedure(s) Performed: LAPAROSCOPIC CHOLECYSTECTOMY (Abdomen)     Patient location during evaluation: PACU Anesthesia Type: General Level of consciousness: awake and alert and oriented Pain management: pain level controlled Vital Signs Assessment: post-procedure vital signs reviewed and stable Respiratory status: spontaneous breathing, nonlabored ventilation and respiratory function stable Cardiovascular status: blood pressure returned to baseline and stable Postop Assessment: no apparent nausea or vomiting Anesthetic complications: no   No notable events documented.  Last Vitals:  Vitals:   12/21/22 1015 12/21/22 1030  BP: (!) 175/98 (!) 154/97  Pulse: (!) 103 (!) 104  Resp: (!) 25 16  Temp:    SpO2: 94% 93%    Last Pain:  Vitals:   12/21/22 1030  TempSrc:   PainSc: Asleep                 Elverda Wendel A.

## 2022-12-21 NOTE — Anesthesia Procedure Notes (Signed)
Procedure Name: Intubation Date/Time: 12/21/2022 7:38 AM  Performed by: [place], Clydene Pugh, CRNAPre-anesthesia Checklist: Patient identified, Emergency Drugs available, Suction available and Patient being monitored Patient Re-evaluated:Patient Re-evaluated prior to induction Oxygen Delivery Method: Circle system utilized Preoxygenation: Pre-oxygenation with 100% oxygen Induction Type: IV induction Ventilation: Mask ventilation without difficulty Laryngoscope Size: Glidescope and 4 Grade View: Grade I Tube type: Oral Number of attempts: 1 Airway Equipment and Method: Stylet, Oral airway, Video-laryngoscopy and Rigid stylet Placement Confirmation: ETT inserted through vocal cords under direct vision, positive ETCO2 and breath sounds checked- equal and bilateral Secured at: 23 cm Tube secured with: Tape Dental Injury: Teeth and Oropharynx as per pre-operative assessment  Comments: Elective glidescope due to small mouth opening and excess tissue

## 2022-12-22 ENCOUNTER — Encounter (HOSPITAL_COMMUNITY): Payer: Self-pay | Admitting: General Surgery

## 2022-12-22 ENCOUNTER — Observation Stay (HOSPITAL_COMMUNITY): Payer: Medicaid Other

## 2022-12-22 DIAGNOSIS — F17210 Nicotine dependence, cigarettes, uncomplicated: Secondary | ICD-10-CM | POA: Diagnosis present

## 2022-12-22 DIAGNOSIS — K7581 Nonalcoholic steatohepatitis (NASH): Secondary | ICD-10-CM | POA: Diagnosis present

## 2022-12-22 DIAGNOSIS — E8809 Other disorders of plasma-protein metabolism, not elsewhere classified: Secondary | ICD-10-CM | POA: Diagnosis not present

## 2022-12-22 DIAGNOSIS — K651 Peritoneal abscess: Secondary | ICD-10-CM | POA: Diagnosis not present

## 2022-12-22 DIAGNOSIS — M109 Gout, unspecified: Secondary | ICD-10-CM | POA: Diagnosis present

## 2022-12-22 DIAGNOSIS — I4891 Unspecified atrial fibrillation: Secondary | ICD-10-CM | POA: Diagnosis not present

## 2022-12-22 DIAGNOSIS — E871 Hypo-osmolality and hyponatremia: Secondary | ICD-10-CM | POA: Diagnosis not present

## 2022-12-22 DIAGNOSIS — Z6841 Body Mass Index (BMI) 40.0 and over, adult: Secondary | ICD-10-CM | POA: Diagnosis not present

## 2022-12-22 DIAGNOSIS — E872 Acidosis, unspecified: Secondary | ICD-10-CM | POA: Diagnosis not present

## 2022-12-22 DIAGNOSIS — K8010 Calculus of gallbladder with chronic cholecystitis without obstruction: Secondary | ICD-10-CM | POA: Diagnosis present

## 2022-12-22 DIAGNOSIS — Z66 Do not resuscitate: Secondary | ICD-10-CM | POA: Diagnosis not present

## 2022-12-22 DIAGNOSIS — K811 Chronic cholecystitis: Secondary | ICD-10-CM | POA: Diagnosis not present

## 2022-12-22 DIAGNOSIS — R34 Anuria and oliguria: Secondary | ICD-10-CM | POA: Diagnosis not present

## 2022-12-22 DIAGNOSIS — E876 Hypokalemia: Secondary | ICD-10-CM | POA: Diagnosis not present

## 2022-12-22 DIAGNOSIS — N179 Acute kidney failure, unspecified: Secondary | ICD-10-CM | POA: Diagnosis not present

## 2022-12-22 DIAGNOSIS — A419 Sepsis, unspecified organism: Secondary | ICD-10-CM | POA: Diagnosis not present

## 2022-12-22 DIAGNOSIS — E662 Morbid (severe) obesity with alveolar hypoventilation: Secondary | ICD-10-CM | POA: Diagnosis present

## 2022-12-22 DIAGNOSIS — K9189 Other postprocedural complications and disorders of digestive system: Secondary | ICD-10-CM | POA: Diagnosis not present

## 2022-12-22 DIAGNOSIS — R7303 Prediabetes: Secondary | ICD-10-CM | POA: Diagnosis present

## 2022-12-22 DIAGNOSIS — J9621 Acute and chronic respiratory failure with hypoxia: Secondary | ICD-10-CM | POA: Diagnosis not present

## 2022-12-22 DIAGNOSIS — Z7401 Bed confinement status: Secondary | ICD-10-CM | POA: Diagnosis not present

## 2022-12-22 DIAGNOSIS — J9601 Acute respiratory failure with hypoxia: Secondary | ICD-10-CM | POA: Diagnosis not present

## 2022-12-22 DIAGNOSIS — J9602 Acute respiratory failure with hypercapnia: Secondary | ICD-10-CM | POA: Diagnosis not present

## 2022-12-22 DIAGNOSIS — Y838 Other surgical procedures as the cause of abnormal reaction of the patient, or of later complication, without mention of misadventure at the time of the procedure: Secondary | ICD-10-CM | POA: Diagnosis not present

## 2022-12-22 DIAGNOSIS — J9811 Atelectasis: Secondary | ICD-10-CM | POA: Diagnosis not present

## 2022-12-22 DIAGNOSIS — R6521 Severe sepsis with septic shock: Secondary | ICD-10-CM | POA: Diagnosis not present

## 2022-12-22 DIAGNOSIS — K219 Gastro-esophageal reflux disease without esophagitis: Secondary | ICD-10-CM | POA: Diagnosis present

## 2022-12-22 LAB — CBC
HCT: 45.5 % (ref 39.0–52.0)
Hemoglobin: 14.5 g/dL (ref 13.0–17.0)
MCH: 28.3 pg (ref 26.0–34.0)
MCHC: 31.9 g/dL (ref 30.0–36.0)
MCV: 88.7 fL (ref 80.0–100.0)
Platelets: 317 10*3/uL (ref 150–400)
RBC: 5.13 MIL/uL (ref 4.22–5.81)
RDW: 13.2 % (ref 11.5–15.5)
WBC: 23.3 10*3/uL — ABNORMAL HIGH (ref 4.0–10.5)
nRBC: 0 % (ref 0.0–0.2)

## 2022-12-22 LAB — COMPREHENSIVE METABOLIC PANEL
ALT: 47 U/L — ABNORMAL HIGH (ref 0–44)
AST: 30 U/L (ref 15–41)
Albumin: 3.3 g/dL — ABNORMAL LOW (ref 3.5–5.0)
Alkaline Phosphatase: 64 U/L (ref 38–126)
Anion gap: 11 (ref 5–15)
BUN: 14 mg/dL (ref 6–20)
CO2: 29 mmol/L (ref 22–32)
Calcium: 8.7 mg/dL — ABNORMAL LOW (ref 8.9–10.3)
Chloride: 97 mmol/L — ABNORMAL LOW (ref 98–111)
Creatinine, Ser: 0.65 mg/dL (ref 0.61–1.24)
GFR, Estimated: >60 mL/min (ref >60)
Glucose, Bld: 171 mg/dL — ABNORMAL HIGH (ref 70–99)
Potassium: 3.9 mmol/L (ref 3.5–5.1)
Sodium: 137 mmol/L (ref 135–145)
Total Bilirubin: 1 mg/dL (ref 0.3–1.2)
Total Protein: 7.3 g/dL (ref 6.5–8.1)

## 2022-12-22 LAB — SURGICAL PATHOLOGY

## 2022-12-22 MED ORDER — HYDROCORTISONE 1 % EX CREA
TOPICAL_CREAM | Freq: Three times a day (TID) | CUTANEOUS | Status: DC | PRN
  Filled 2022-12-22 (×2): qty 28

## 2022-12-22 NOTE — Progress Notes (Signed)
    1 Day Post-Op  Subjective: Having a lot of pain and abdominal sensitivity, regretting decision to proceed with surgery  ROS: See above, otherwise other systems negative  Objective: Vital signs in last 24 hours: Temp:  [97.7 F (36.5 C)-98.3 F (36.8 C)] 98.1 F (36.7 C) (09/06 0512) Pulse Rate:  [102-110] 110 (09/06 0512) Resp:  [15-25] 18 (09/06 0512) BP: (135-178)/(81-103) 142/81 (09/06 0512) SpO2:  [91 %-96 %] 94 % (09/06 0512) Last BM Date : 12/21/22  Intake/Output from previous day: 09/05 0701 - 09/06 0700 In: 1993 [P.O.:480; I.V.:1400; IV Piggyback:113] Out: 730 [Drains:680; Blood:50] Intake/Output this shift: No intake/output data recorded.  PE: Gen: NAD Resp: nonlabored CV: RRR Abd: soft, incisions c/d/I, drain with bilious/bloody aoutput  Lab Results:  Recent Labs    12/21/22 1026 12/22/22 0455  WBC 15.7* 23.3*  HGB 15.6 14.5  HCT 49.0 45.5  PLT 266 317   BMET Recent Labs    12/21/22 1026 12/22/22 0455  NA  --  137  K  --  3.9  CL  --  97*  CO2  --  29  GLUCOSE  --  171*  BUN  --  14  CREATININE 0.71 0.65  CALCIUM  --  8.7*   PT/INR No results for input(s): "LABPROT", "INR" in the last 72 hours. CMP     Component Value Date/Time   NA 137 12/22/2022 0455   NA 139 11/14/2012 0000   K 3.9 12/22/2022 0455   K 4.1 11/14/2012 0000   CL 97 (L) 12/22/2022 0455   CO2 29 12/22/2022 0455   GLUCOSE 171 (H) 12/22/2022 0455   BUN 14 12/22/2022 0455   BUN 11 11/14/2012 0000   CREATININE 0.65 12/22/2022 0455   CREATININE 0.62 11/14/2012 0000   CALCIUM 8.7 (L) 12/22/2022 0455   PROT 7.3 12/22/2022 0455   ALBUMIN 3.3 (L) 12/22/2022 0455   ALBUMIN 2.6 11/14/2012 0000   AST 30 12/22/2022 0455   ALT 47 (H) 12/22/2022 0455   ALKPHOS 64 12/22/2022 0455   BILITOT 1.0 12/22/2022 0455   GFRNONAA >60 12/22/2022 0455   GFRAA >60 12/30/2017 0619   Lipase     Component Value Date/Time   LIPASE 28 08/17/2022 1605    Studies/Results: No results  found.  Anti-infectives: Anti-infectives (From admission, onward)    Start     Dose/Rate Route Frequency Ordered Stop   12/21/22 0707  ceFAZolin (ANCEF) 3-0.9 GM/100ML-% IVPB       Note to Pharmacy: Blair Promise B: cabinet override      12/21/22 0707 12/21/22 1914   12/21/22 0600  ceFAZolin (ANCEF) IVPB 2g/100 mL premix  Status:  Discontinued        2 g 200 mL/hr over 30 Minutes Intravenous On call to O.R. 12/21/22 0541 12/21/22 1058       Assessment/Plan  37 yo male s/p lap partial cholecystectomy, morbid obesity, immobility   FEN - carb modified diet VTE - lovenox ID - no issues Dispo - in hospital, add CPAP tonight  I reviewed last 24 h vitals and pain scores, last 48 h intake and output, last 24 h labs and trends, and last 24 h imaging results.  This care required moderate level of medical decision making.    LOS: 0 days   De Bl[facility] 12/22/2022, 7:44 AM Please see Amion for pager number during day hours 7:00am-4:30pm or 7:00am -11:30am on weekends

## 2022-12-22 NOTE — Progress Notes (Signed)
   12/22/22 2130  BiPAP/CPAP/SIPAP  $ Non-Invasive Ventilator  Non-Invasive Vent Set Up  $ Face Mask Large  Yes  BiPAP/CPAP/SIPAP Pt Type Adult  BiPAP/CPAP/SIPAP DREAMSTATIOND  Reason BIPAP/CPAP not in use Non-compliant (RT placed mask on patient, pt stated he could not tolerate it. RT left machine in standby and encouraged pt to let me know if he would like to attempt it again.)  BiPAP/CPAP /SiPAP Vitals  Pulse Rate (!) 101  Resp 18  SpO2 97 %  MEWS Score/Color  MEWS Score 1  MEWS Score Color Green

## 2022-12-22 NOTE — Plan of Care (Signed)
  Problem: Education: Goal: Knowledge of General Education information will improve Description: Including pain rating scale, medication(s)/side effects and non-pharmacologic comfort measures Outcome: Not Progressing   Problem: Health Behavior/Discharge Planning: Goal: Ability to manage health-related needs will improve Outcome: Not Progressing   Problem: Clinical Measurements: Goal: Ability to maintain clinical measurements within normal limits will improve Outcome: Not Progressing Goal: Respiratory complications will improve Outcome: Not Progressing   Problem: Activity: Goal: Risk for activity intolerance will decrease Outcome: Not Progressing   Problem: Pain Managment: Goal: General experience of comfort will improve Outcome: Not Progressing

## 2022-12-23 LAB — CBC
HCT: 42.5 % (ref 39.0–52.0)
Hemoglobin: 13 g/dL (ref 13.0–17.0)
MCH: 28.3 pg (ref 26.0–34.0)
MCHC: 30.6 g/dL (ref 30.0–36.0)
MCV: 92.4 fL (ref 80.0–100.0)
Platelets: 259 10*3/uL (ref 150–400)
RBC: 4.6 MIL/uL (ref 4.22–5.81)
RDW: 13.3 % (ref 11.5–15.5)
WBC: 14 10*3/uL — ABNORMAL HIGH (ref 4.0–10.5)
nRBC: 0 % (ref 0.0–0.2)

## 2022-12-23 LAB — COMPREHENSIVE METABOLIC PANEL
ALT: 37 U/L (ref 0–44)
AST: 18 U/L (ref 15–41)
Albumin: 3.2 g/dL — ABNORMAL LOW (ref 3.5–5.0)
Alkaline Phosphatase: 58 U/L (ref 38–126)
Anion gap: 12 (ref 5–15)
BUN: 23 mg/dL — ABNORMAL HIGH (ref 6–20)
CO2: 28 mmol/L (ref 22–32)
Calcium: 8.7 mg/dL — ABNORMAL LOW (ref 8.9–10.3)
Chloride: 97 mmol/L — ABNORMAL LOW (ref 98–111)
Creatinine, Ser: 0.81 mg/dL (ref 0.61–1.24)
GFR, Estimated: >60 mL/min (ref >60)
Glucose, Bld: 131 mg/dL — ABNORMAL HIGH (ref 70–99)
Potassium: 3.7 mmol/L (ref 3.5–5.1)
Sodium: 137 mmol/L (ref 135–145)
Total Bilirubin: 0.9 mg/dL (ref 0.3–1.2)
Total Protein: 6.8 g/dL (ref 6.5–8.1)

## 2022-12-23 NOTE — Plan of Care (Signed)

## 2022-12-23 NOTE — Progress Notes (Signed)
Patient was asked if he needs to urinate and he responded no he added that he will call if he needs to. Patient was also dry upon assessment.

## 2022-12-23 NOTE — Progress Notes (Signed)
2 Days Post-Op  Subjective: Continuing to have significant pain, but he does report that it feels somewhat improved today. Tolerated minimal clear c/t nausea.  No emesis.  Denies other complaints.   ROS: See above, otherwise other systems negative  Objective: Vital signs in last 24 hours: Temp:  [97.3 F (36.3 C)-98.2 F (36.8 C)] 97.3 F (36.3 C) (09/07 0543) Pulse Rate:  [95-118] 95 (09/07 0543) Resp:  [17-19] 19 (09/07 0543) BP: (119-151)/(67-85) 151/85 (09/07 0543) SpO2:  [85 %-98 %] 98 % (09/07 0543) Last BM Date : 12/21/22  Intake/Output from previous day: 09/06 0701 - 09/07 0700 In: 1237 [P.O.:640; IV Piggyback:597] Out: 380 [Drains:380] Intake/Output this shift: Total I/O In: 240 [P.O.:240] Out: 150 [Drains:150]  PE: Gen: NAD Resp: nonlabored CV: RRR Abd: soft, incisions c/d/I, drain with bilious/bloody aoutput  Lab Results:  Recent Labs    12/22/22 0455 12/23/22 0440  WBC 23.3* 14.0*  HGB 14.5 13.0  HCT 45.5 42.5  PLT 317 259   BMET Recent Labs    12/22/22 0455 12/23/22 0440  NA 137 137  K 3.9 3.7  CL 97* 97*  CO2 29 28  GLUCOSE 171* 131*  BUN 14 23*  CREATININE 0.65 0.81  CALCIUM 8.7* 8.7*   PT/INR No results for input(s): "LABPROT", "INR" in the last 72 hours. CMP     Component Value Date/Time   NA 137 12/23/2022 0440   NA 139 11/14/2012 0000   K 3.7 12/23/2022 0440   K 4.1 11/14/2012 0000   CL 97 (L) 12/23/2022 0440   CO2 28 12/23/2022 0440   GLUCOSE 131 (H) 12/23/2022 0440   BUN 23 (H) 12/23/2022 0440   BUN 11 11/14/2012 0000   CREATININE 0.81 12/23/2022 0440   CREATININE 0.62 11/14/2012 0000   CALCIUM 8.7 (L) 12/23/2022 0440   PROT 6.8 12/23/2022 0440   ALBUMIN 3.2 (L) 12/23/2022 0440   ALBUMIN 2.6 11/14/2012 0000   AST 18 12/23/2022 0440   ALT 37 12/23/2022 0440   ALKPHOS 58 12/23/2022 0440   BILITOT 0.9 12/23/2022 0440   GFRNONAA >60 12/23/2022 0440   GFRAA >60 12/30/2017 0619   Lipase     Component Value  Date/Time   LIPASE 28 08/17/2022 1605    Studies/Results: DG CHEST PORT 1 VIEW  Result Date: 12/22/2022 CLINICAL DATA:  Cough. EXAM: PORTABLE CHEST 1 VIEW COMPARISON:  Chest radiograph dated 12/29/2017. FINDINGS: Shallow inspiration. There is cardiomegaly with vascular congestion. Bilateral linear atelectasis. No focal consolidation, pleural effusion, or pneumothorax. No acute osseous pathology. IMPRESSION: Cardiomegaly with vascular congestion. No focal consolidation. Electronically Signed   By: Elgie Collard M.D.   On: 12/22/2022 15:51    Anti-infectives: Anti-infectives (From admission, onward)    Start     Dose/Rate Route Frequency Ordered Stop   12/21/22 0707  ceFAZolin (ANCEF) 3-0.9 GM/100ML-% IVPB       Note to Pharmacy: Blair Promise B: cabinet override      12/21/22 0707 12/21/22 1914   12/21/22 0600  ceFAZolin (ANCEF) IVPB 2g/100 mL premix  Status:  Discontinued        2 g 200 mL/hr over 30 Minutes Intravenous On call to O.R. 12/21/22 0541 12/21/22 1058       Assessment/Plan  37 yo male s/p lap partial cholecystectomy, morbid obesity, immobility   - Continue w/ multi-modal pain control  FEN - carb modified diet VTE - lovenox ID - no issues Dispo - in hospital, add CPAP tonight  I reviewed  last 24 h vitals and pain scores, last 48 h intake and output, last 24 h labs and trends, and last 24 h imaging results.  This care required moderate level of medical decision making.    LOS: 1 day   [facility] 12/23/2022, 11:26 AM Please see Amion for pager number during day hours 7:00am-4:30pm or 7:00am -11:30am on weekends

## 2022-12-23 NOTE — Progress Notes (Signed)
   12/23/22 2110  BiPAP/CPAP/SIPAP  Reason BIPAP/CPAP not in use Non-compliant (pt unable to tolerate it)

## 2022-12-23 NOTE — Plan of Care (Signed)
  Problem: Nutrition: Goal: Adequate nutrition will be maintained Outcome: Adequate for Discharge   

## 2022-12-24 NOTE — Plan of Care (Signed)

## 2022-12-24 NOTE — Progress Notes (Signed)
3 Days Post-Op  Subjective: Reports that his pain is improving.  He was able to tolerate some PO yesterday. Drain thinning but remains bilious.   ROS: See above, otherwise other systems negative  Objective: Vital signs in last 24 hours: Temp:  [97.7 F (36.5 C)-97.8 F (36.6 C)] 97.7 F (36.5 C) (09/08 0507) Pulse Rate:  [90-102] 90 (09/08 0507) Resp:  [18-20] 20 (09/08 0507) BP: (126-146)/(67-92) 144/82 (09/08 0507) SpO2:  [92 %-99 %] 99 % (09/08 0507) Last BM Date : 12/21/22  Intake/Output from previous day: 09/07 0701 - 09/08 0700 In: 2010 [P.O.:1680; IV Piggyback:330] Out: 440 [Drains:440] Intake/Output this shift: No intake/output data recorded.  PE: Gen: NAD Resp: nonlabored CV: RRR Abd: soft, incisions c/d/I, drain with bilious output  Lab Results:  Recent Labs    12/22/22 0455 12/23/22 0440  WBC 23.3* 14.0*  HGB 14.5 13.0  HCT 45.5 42.5  PLT 317 259   BMET Recent Labs    12/22/22 0455 12/23/22 0440  NA 137 137  K 3.9 3.7  CL 97* 97*  CO2 29 28  GLUCOSE 171* 131*  BUN 14 23*  CREATININE 0.65 0.81  CALCIUM 8.7* 8.7*   PT/INR No results for input(s): "LABPROT", "INR" in the last 72 hours. CMP     Component Value Date/Time   NA 137 12/23/2022 0440   NA 139 11/14/2012 0000   K 3.7 12/23/2022 0440   K 4.1 11/14/2012 0000   CL 97 (L) 12/23/2022 0440   CO2 28 12/23/2022 0440   GLUCOSE 131 (H) 12/23/2022 0440   BUN 23 (H) 12/23/2022 0440   BUN 11 11/14/2012 0000   CREATININE 0.81 12/23/2022 0440   CREATININE 0.62 11/14/2012 0000   CALCIUM 8.7 (L) 12/23/2022 0440   PROT 6.8 12/23/2022 0440   ALBUMIN 3.2 (L) 12/23/2022 0440   ALBUMIN 2.6 11/14/2012 0000   AST 18 12/23/2022 0440   ALT 37 12/23/2022 0440   ALKPHOS 58 12/23/2022 0440   BILITOT 0.9 12/23/2022 0440   GFRNONAA >60 12/23/2022 0440   GFRAA >60 12/30/2017 0619   Lipase     Component Value Date/Time   LIPASE 28 08/17/2022 1605    Studies/Results: DG CHEST PORT 1  VIEW  Result Date: 12/22/2022 CLINICAL DATA:  Cough. EXAM: PORTABLE CHEST 1 VIEW COMPARISON:  Chest radiograph dated 12/29/2017. FINDINGS: Shallow inspiration. There is cardiomegaly with vascular congestion. Bilateral linear atelectasis. No focal consolidation, pleural effusion, or pneumothorax. No acute osseous pathology. IMPRESSION: Cardiomegaly with vascular congestion. No focal consolidation. Electronically Signed   By: Elgie Collard M.D.   On: 12/22/2022 15:51    Anti-infectives: Anti-infectives (From admission, onward)    Start     Dose/Rate Route Frequency Ordered Stop   12/21/22 0707  ceFAZolin (ANCEF) 3-0.9 GM/100ML-% IVPB       Note to Pharmacy: Blair Promise B: cabinet override      12/21/22 0707 12/21/22 1914   12/21/22 0600  ceFAZolin (ANCEF) IVPB 2g/100 mL premix  Status:  Discontinued        2 g 200 mL/hr over 30 Minutes Intravenous On call to O.R. 12/21/22 0541 12/21/22 1058       Assessment/Plan  37 yo male s/p lap partial cholecystectomy, morbid obesity, immobility   - Continue w/ multi-modal pain control - monitor drain output   FEN - carb modified diet VTE - lovenox ID - no issues Dispo - in hospital  I reviewed last 24 h vitals and pain scores, last 48  h intake and output, last 24 h labs and trends, and last 24 h imaging results.  This care required moderate level of medical decision making.    LOS: 2 days   [facility] 12/24/2022, 8:27 AM Please see Amion for pager number during day hours 7:00am-4:30pm or 7:00am -11:30am on weekends

## 2022-12-24 NOTE — Plan of Care (Signed)
  Problem: Education: Goal: Knowledge of General Education information will improve Description: Including pain rating scale, medication(s)/side effects and non-pharmacologic comfort measures Outcome: Progressing   Problem: Health Behavior/Discharge Planning: Goal: Ability to manage health-related needs will improve Outcome: Progressing   Problem: Clinical Measurements: Goal: Will remain free from infection Outcome: Progressing Goal: Diagnostic test results will improve Outcome: Progressing Goal: Respiratory complications will improve Outcome: Progressing   Problem: Activity: Goal: Risk for activity intolerance will decrease Outcome: Progressing   Problem: Safety: Goal: Ability to remain free from injury will improve Outcome: Progressing   Problem: Skin Integrity: Goal: Risk for impaired skin integrity will decrease Outcome: Progressing

## 2022-12-24 NOTE — Progress Notes (Signed)
   12/24/22 2137  BiPAP/CPAP/SIPAP  Reason BIPAP/CPAP not in use Non-compliant (unable to tolerate)

## 2022-12-25 NOTE — Plan of Care (Signed)
  Problem: Pain Managment: Goal: General experience of comfort will improve Outcome: Progressing   

## 2022-12-26 LAB — TROPONIN I (HIGH SENSITIVITY): Troponin I (High Sensitivity): 2 ng/L (ref <18)

## 2022-12-26 NOTE — Progress Notes (Signed)
   12/25/22 2100  BiPAP/CPAP/SIPAP  BiPAP/CPAP/SIPAP Pt Type Adult  BiPAP/CPAP/SIPAP DREAMSTATIOND  Reason BIPAP/CPAP not in use Non-compliant (Pulled-Non Compliant.)

## 2022-12-26 NOTE — Progress Notes (Signed)
Nurse offered to give patient a bath this morning and or change gown and linen but patient refused.

## 2022-12-26 NOTE — Progress Notes (Signed)
   12/26/22 2200  BiPAP/CPAP/SIPAP  BiPAP/CPAP/SIPAP Pt Type Adult  Reason BIPAP/CPAP not in use Non-compliant   Made aware prior to notify if wanting to use during admission.

## 2022-12-26 NOTE — Progress Notes (Signed)
    5 Days Post-Op  Subjective: Chest pain this morning, EKG normal, tolerated more food yesterday, persistent pain issues  ROS: See above, otherwise other systems negative  Objective: Vital signs in last 24 hours: Temp:  [97.5 F (36.4 C)-98.3 F (36.8 C)] 97.5 F (36.4 C) (09/10 0547) Pulse Rate:  [65-74] 65 (09/10 0547) Resp:  [14-18] 14 (09/10 0547) BP: (144-152)/(71-81) 144/71 (09/10 0547) SpO2:  [96 %-100 %] 96 % (09/10 0547) Last BM Date : 12/25/22  Intake/Output from previous day: 09/09 0701 - 09/10 0700 In: 1205.5 [P.O.:960; IV Piggyback:245.5] Out: 1220 [Urine:700; Drains:520] Intake/Output this shift: No intake/output data recorded.  PE: Gen: anxious Resp: nonlabored CV: RRR Abd: incisions c/d/I, bilious output  Lab Results:  No results for input(s): "WBC", "HGB", "HCT", "PLT" in the last 72 hours. BMET No results for input(s): "NA", "K", "CL", "CO2", "GLUCOSE", "BUN", "CREATININE", "CALCIUM" in the last 72 hours. PT/INR No results for input(s): "LABPROT", "INR" in the last 72 hours. CMP     Component Value Date/Time   NA 137 12/23/2022 0440   NA 139 11/14/2012 0000   K 3.7 12/23/2022 0440   K 4.1 11/14/2012 0000   CL 97 (L) 12/23/2022 0440   CO2 28 12/23/2022 0440   GLUCOSE 131 (H) 12/23/2022 0440   BUN 23 (H) 12/23/2022 0440   BUN 11 11/14/2012 0000   CREATININE 0.81 12/23/2022 0440   CREATININE 0.62 11/14/2012 0000   CALCIUM 8.7 (L) 12/23/2022 0440   PROT 6.8 12/23/2022 0440   ALBUMIN 3.2 (L) 12/23/2022 0440   ALBUMIN 2.6 11/14/2012 0000   AST 18 12/23/2022 0440   ALT 37 12/23/2022 0440   ALKPHOS 58 12/23/2022 0440   BILITOT 0.9 12/23/2022 0440   GFRNONAA >60 12/23/2022 0440   GFRAA >60 12/30/2017 0619   Lipase     Component Value Date/Time   LIPASE 28 08/17/2022 1605    Studies/Results: No results found.  Anti-infectives: Anti-infectives (From admission, onward)    Start     Dose/Rate Route Frequency Ordered Stop   12/21/22  0707  ceFAZolin (ANCEF) 3-0.9 GM/100ML-% IVPB       Note to Pharmacy: Blair Promise B: cabinet override      12/21/22 0707 12/21/22 1914   12/21/22 0600  ceFAZolin (ANCEF) IVPB 2g/100 mL premix  Status:  Discontinued        2 g 200 mL/hr over 30 Minutes Intravenous On call to O.R. 12/21/22 0541 12/21/22 1058       Assessment/Plan  37 yo male s/p partial cholecystectomy, persistent bile output   FEN - carb mod diet VTE - lovenox ID - no issues Dispo - f/u troponin, pain issues, continue drain management  I reviewed last 24 h vitals and pain scores, last 48 h intake and output, last 24 h labs and trends, and last 24 h imaging results.  This care required moderate level of medical decision making.    LOS: 4 days   De Blanch [facility] 12/26/2022, 9:19 AM Please see Amion for pager number during day hours 7:00am-4:30pm or 7:00am -11:30am on weekends

## 2022-12-26 NOTE — Progress Notes (Signed)
    5 Days Post-Op  Subjective: Persistent pain issues, tolerating a small amount of food  ROS: See above, otherwise other systems negative  Objective:   Intake/Output from previous day: 09/09 0701 - 09/10 0700 In: 1205.5 [P.O.:960; IV Piggyback:245.5] Out: 1220 [Urine:700; Drains:520] Intake/Output this shift: No intake/output data recorded.  PE: Gen: NAD Resp: nonlabored CV: RRR Abd: soft, incisions c/d/I, bilious drainage  Lab Results:  No results for input(s): "WBC", "HGB", "HCT", "PLT" in the last 72 hours. BMET No results for input(s): "NA", "K", "CL", "CO2", "GLUCOSE", "BUN", "CREATININE", "CALCIUM" in the last 72 hours. PT/INR No results for input(s): "LABPROT", "INR" in the last 72 hours. CMP     Component Value Date/Time   NA 137 12/23/2022 0440   NA 139 11/14/2012 0000   K 3.7 12/23/2022 0440   K 4.1 11/14/2012 0000   CL 97 (L) 12/23/2022 0440   CO2 28 12/23/2022 0440   GLUCOSE 131 (H) 12/23/2022 0440   BUN 23 (H) 12/23/2022 0440   BUN 11 11/14/2012 0000   CREATININE 0.81 12/23/2022 0440   CREATININE 0.62 11/14/2012 0000   CALCIUM 8.7 (L) 12/23/2022 0440   PROT 6.8 12/23/2022 0440   ALBUMIN 3.2 (L) 12/23/2022 0440   ALBUMIN 2.6 11/14/2012 0000   AST 18 12/23/2022 0440   ALT 37 12/23/2022 0440   ALKPHOS 58 12/23/2022 0440   BILITOT 0.9 12/23/2022 0440   GFRNONAA >60 12/23/2022 0440   GFRAA >60 12/30/2017 0619   Lipase     Component Value Date/Time   LIPASE 28 08/17/2022 1605    Studies/Results: No results found.  Anti-infectives: Anti-infectives (From admission, onward)    Start     Dose/Rate Route Frequency Ordered Stop   12/21/22 0707  ceFAZolin (ANCEF) 3-0.9 GM/100ML-% IVPB       Note to Pharmacy: Blair Promise B: cabinet override      12/21/22 0707 12/21/22 1914   12/21/22 0600  ceFAZolin (ANCEF) IVPB 2g/100 mL premix  Status:  Discontinued        2 g 200 mL/hr over 30 Minutes Intravenous On call to O.R. 12/21/22 0541 12/21/22  1058       Assessment/Plan  S/p partial cholecystectomy   FEN - carb mod diet VTE - lovenox ID - no issues Dispo - pain related to bile drainage  I reviewed last 24 h vitals and pain scores, last 48 h intake and output, last 24 h labs and trends, and last 24 h imaging results.  This care required moderate level of medical decision making.    LOS: 4 days   De Blanch [facility] 12/26/2022, 9:18 AM Please see Amion for pager number during day hours 7:00am-4:30pm or 7:00am -11:30am on weekends

## 2022-12-26 NOTE — TOC Initial Note (Signed)
Transition of Care [facility]) - Initial/Assessment Note    Patient Details  Name: [name] MRN: [id number] Date of Birth: 1985/09/17  [facility]) CM/SW Contact:    Amada Jupiter, LCSW Phone Number: 12/26/2022, 4:15 PM  Clinical Narrative:                 Met with pt today to introduce self and review dc plans.  Pt confirms that he has been a LTC resident at [facility] since April 2024 and plans to return.  Have confirmed this with facility as well.  He denies any concerns at this time and aware TOC will continue to follow to assist with his dc when medically cleared.  Expected Discharge Plan: Long Term Nursing Home Barriers to Discharge: Continued Medical Work up   Patient Goals and CMS Choice Patient states their goals for this hospitalization and ongoing recovery are:: return to SNF          Expected Discharge Plan and Services In-house Referral: Clinical Social Work   Post Acute Care Choice: Nursing Home Living arrangements for the past 2 months: Skilled Nursing Facility                                      Prior Living Arrangements/Services Living arrangements for the past 2 months: Skilled Nursing Facility Lives with:: Facility Resident Patient language and need for interpreter reviewed:: Yes Do you feel safe going back to the place where you live?: Yes      Need for Family Participation in Patient Care: No (Comment) Care giver support system in place?: Yes (comment)   Criminal Activity/Legal Involvement Pertinent to Current Situation/Hospitalization: No - Comment as needed  Activities of Daily Living Home Assistive Devices/Equipment: None ADL Screening (condition at time of admission) Patient's cognitive ability adequate to safely complete daily activities?: Yes Is the patient deaf or have difficulty hearing?: No Does the patient have difficulty seeing, even when wearing glasses/contacts?: No Does the patient have difficulty  concentrating, remembering, or making decisions?: No Patient able to express need for assistance with ADLs?: Yes Does the patient have difficulty dressing or bathing?: Yes Independently performs ADLs?: No Does the patient have difficulty walking or climbing stairs?: Yes Weakness of Legs: Both Weakness of Arms/Hands: None  Permission Sought/Granted Permission sought to share information with : Facility Medical sales representative, Family Supports Permission granted to share information with : Yes, Verbal Permission Granted  Share Information with NAME: sister, Everette Rank @ [phone]  Permission granted to share info w AGENCY: [facility]        Emotional Assessment Appearance:: Appears stated age Attitude/Demeanor/Rapport: Lethargic Affect (typically observed): Flat Orientation: : Oriented to Self, Oriented to Place, Oriented to  Time, Oriented to Situation Alcohol / Substance Use: Not Applicable Psych Involvement: No (comment)  Admission diagnosis:  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] Chronic cholecystitis [K81.1] Patient Active Problem List   Diagnosis Date Noted   Chronic cholecystitis 12/22/2022   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   GI bleed 08/17/2022   Nausea 08/17/2022   GERD (gastroesophageal reflux disease) 08/17/2022   Fall at home, initial encounter 06/27/2022   Leukocytosis 06/27/2022   Peptic ulcer disease 06/27/2022   Left hip pain 06/26/2022   Gout 12/29/2017   Tobacco abuse 12/29/2017   Acute hypoxemic respiratory failure (HCC) 12/29/2017   Bronchitis 12/29/2017   Rectal bleed 07/30/2014   Hiatal hernia  Hemorrhoid    Acute lower GI bleeding 07/29/2014   Abdominal pain    Wrist pain, acute    Tenosynovitis of finger, hand, or wrist    Cellulitis of hand 03/10/2014   Right upper quadrant abdominal pain 02/10/2014   Cholelithiasis 02/10/2014   Polyarthritis 11/08/2012   UTI (lower urinary tract infection) 10/21/2012    Cellulitis 09/17/2012   Tinea pedis 09/17/2012   Erosive esophagitis 09/07/2012   Hypokalemia 09/05/2012   Gastric ulcer with hemorrhage 09/02/2012   Upper GI bleed 09/02/2012   Acute blood loss anemia 09/02/2012   Hematemesis 09/01/2012   Normocytic anemia 09/01/2012   Acute gouty arthritis 09/01/2012   Morbid obesity (HCC) 09/01/2012   PCP:  Toma Deiters, MD Pharmacy:   [facility] [place], [place] - [address] [place] [address] Phone: [phone] Fax: [phone]     Social Determinants of Health (SDOH) Social History: SDOH Screenings   Food Insecurity: Patient Declined (12/23/2022)  Housing: Patient Declined (08/17/2022)  Transportation Needs: No Transportation Needs (08/17/2022)  Utilities: Not At Risk (08/17/2022)  Tobacco Use: High Risk (12/21/2022)   SDOH Interventions:     Readmission Risk Interventions    12/26/2022    4:01 PM  Readmission Risk Prevention Plan  Post Dischage Appt Complete  Medication Screening Complete  Transportation Screening Complete

## 2022-12-26 NOTE — Progress Notes (Signed)
Patient complains of left side chest pain radiating into left shoulder, VSS, rapid RN notified, MD notified, EKG done with normal results, MD at bedside with new orders made and carried out.

## 2022-12-26 NOTE — Plan of Care (Signed)
  Problem: Education: Goal: Knowledge of General Education information will improve Description Including pain rating scale, medication(s)/side effects and non-pharmacologic comfort measures Outcome: Progressing   

## 2022-12-26 NOTE — Consult Note (Signed)
Reason for Consult:Bile Leak Referring Physician: CCS  [name] HPI: This is a 37 year old male with a PMH of gallstones s/p subtotal lap chole with a persistent bile leak.  A subtotal lap chole was performed as this was the only safe maneuver with his morbid obesity.  Since his surgery on 12/21/2022 he continues to have significant biliary leak.  He averages around 500 mL per day.  GI was consulted to consider a stent placement.  Past Medical History:  Diagnosis Date   Acute blood loss anemia 09/02/2012   S/p 1 unit rbcs   Cellulitis    Erosive esophagitis 09/01/2012   NSAID induced.   Gall stones    Gastric ulcer with hemorrhage 09/02/2012   s/p bleeding control tx. Per Dr. Jena Gauss   Gout    Obesity    UTI (lower urinary tract infection)     Past Surgical History:  Procedure Laterality Date   CHOLECYSTECTOMY N/A 12/21/2022   Procedure: LAPAROSCOPIC CHOLECYSTECTOMY;  Surgeon: Kinsinger, De Blanch, MD;  Location: [facility]S;  Service: General;  Laterality: N/A;   COLONOSCOPY WITH PROPOFOL N/A 07/30/2014   RMR: Colonoscopy anal canal/internal hemorrhoids, otherwise normal ileocolonoscoopy.    ESOPHAGOGASTRODUODENOSCOPY Left 09/01/2012   AVW:UJWJXB esophageal erosions consistent with erosive reflux esophagitis/Pre-pyloric benign-appearing gastric ulcer with bleeding stigmata status post bleeding control therapy as described above   ESOPHAGOGASTRODUODENOSCOPY N/A 12/13/2012   JYN:WGNFAO hernia. PReviously noted gastric ulcer completely healed.    ESOPHAGOGASTRODUODENOSCOPY (EGD) WITH PROPOFOL N/A 07/30/2014   Procedure: ESOPHAGOGASTRODUODENOSCOPY (EGD) WITH PROPOFOL;  Surgeon: Corbin Ade, MD;  Location: [facility];  Service: Endoscopy;  Laterality: N/A;   TONSILLECTOMY      Family History  Problem Relation Age of Onset   Heart failure Mother    Colon cancer Neg Hx     Social History:  reports that he has been smoking cigarettes. He has a 7 pack-year smoking history. His smokeless  tobacco use includes snuff. He reports current drug use. Drug: Marijuana. He reports that he does not drink alcohol.  Allergies:  Allergies  Allergen Reactions   Asa [Aspirin] Other (See Comments)    Pt doesn't know reaction.   Clindamycin/Lincomycin     Gastric ulcer     Nsaids Other (See Comments)    Causes Ulcers    Penicillins Other (See Comments)    Pt doesn't know reaction.     Sulfa Antibiotics Other (See Comments)    Pt doesn't know reaction.    Medications: Scheduled:  acetaminophen  1,000 mg Oral TID   allopurinol  100 mg Oral Daily   enoxaparin (LOVENOX) injection  40 mg Subcutaneous Q24H   gabapentin  300 mg Oral QID   hydrocortisone  1 Application Rectal BID   ketorolac  15 mg Intravenous Q8H   lidocaine  1 patch Transdermal Daily   pantoprazole  40 mg Oral BID AC   Continuous:  methocarbamol (ROBAXIN) IV 1,000 mg (12/26/22 1304)    Results for orders placed or performed during the hospital encounter of 12/21/22 (from the past 24 hour(s))  Troponin I (High Sensitivity)     Status: None   Collection Time: 12/26/22  8:36 AM  Result Value Ref Range   Troponin I (High Sensitivity) 2 <18 ng/L     No results found.  ROS:  As stated above in the HPI otherwise negative.  Blood pressure (!) 108/59, pulse 76, temperature 98 F (36.7 C), temperature source Oral, resp. rate 18, height 5\' 7"  (1.702  m), weight (!) 235 kg, SpO2 97%.    PE: Gen: NAD, Alert and Oriented HEENT:  [place]/AT, EOMI Neck: Supple, no LAD Lungs: CTA Bilaterally CV: RRR without M/G/R ABD: Soft, + drain, morbidly obese Ext: No C/C/E  Assessment/Plan: 1) Bile leak. 2) Subtotal cholecytectomy. 3) Morbid obesity.   An attempt to place a stent will be made.  He will have the ERCP on Friday and it will be determined if the procedure will be in the OR or a surgical table will be brought to endo.  This surgical table will be able to handle his weight.  The procedure will need to be performed in a  supine position.  This will be a difficult case, but it appear to be the only viable nonsurgical option at this time.  Plan: 1) ERCP on Friday as described above.  Adalea Handler D 12/26/2022, 3:56 PM

## 2022-12-27 LAB — COMPREHENSIVE METABOLIC PANEL
ALT: 35 U/L (ref 0–44)
AST: 23 U/L (ref 15–41)
Albumin: 3.3 g/dL — ABNORMAL LOW (ref 3.5–5.0)
Alkaline Phosphatase: 88 U/L (ref 38–126)
Anion gap: 13 (ref 5–15)
BUN: 12 mg/dL (ref 6–20)
CO2: 25 mmol/L (ref 22–32)
Calcium: 8.7 mg/dL — ABNORMAL LOW (ref 8.9–10.3)
Chloride: 97 mmol/L — ABNORMAL LOW (ref 98–111)
Creatinine, Ser: 0.6 mg/dL — ABNORMAL LOW (ref 0.61–1.24)
GFR, Estimated: >60 mL/min (ref >60)
Glucose, Bld: 209 mg/dL — ABNORMAL HIGH (ref 70–99)
Potassium: 3.9 mmol/L (ref 3.5–5.1)
Sodium: 135 mmol/L (ref 135–145)
Total Bilirubin: 0.8 mg/dL (ref 0.3–1.2)
Total Protein: 7.7 g/dL (ref 6.5–8.1)

## 2022-12-27 LAB — CBC
HCT: 42.5 % (ref 39.0–52.0)
Hemoglobin: 13.4 g/dL (ref 13.0–17.0)
MCH: 28.6 pg (ref 26.0–34.0)
MCHC: 31.5 g/dL (ref 30.0–36.0)
MCV: 90.8 fL (ref 80.0–100.0)
Platelets: 347 10*3/uL (ref 150–400)
RBC: 4.68 MIL/uL (ref 4.22–5.81)
RDW: 13.2 % (ref 11.5–15.5)
WBC: 17.5 10*3/uL — ABNORMAL HIGH (ref 4.0–10.5)
nRBC: 0 % (ref 0.0–0.2)

## 2022-12-27 MED ORDER — HYDROMORPHONE 1 MG/ML IV SOLN
INTRAVENOUS | Status: DC
  Administered 2022-12-27: 1.5 mg via INTRAVENOUS
  Administered 2022-12-27: 30 mg via INTRAVENOUS
  Administered 2022-12-27: 2.7 mg via INTRAVENOUS
  Administered 2022-12-28: 1.2 mg via INTRAVENOUS
  Administered 2022-12-28: 0.3 mg via INTRAVENOUS
  Administered 2022-12-28: 0.9 mg via INTRAVENOUS
  Filled 2022-12-27: qty 30

## 2022-12-27 MED ORDER — SODIUM CHLORIDE 0.9% FLUSH: 9.0000 mL | INTRAVENOUS | Status: DC | PRN

## 2022-12-27 MED ORDER — NALOXONE HCL 0.4 MG/ML IJ SOLN: 0.4000 mg | INTRAMUSCULAR | Status: DC | PRN

## 2022-12-27 MED ORDER — ONDANSETRON HCL 4 MG/2ML IJ SOLN: 4.0000 mg | Freq: Four times a day (QID) | INTRAMUSCULAR | Status: DC | PRN

## 2022-12-27 MED ORDER — ALUM & MAG HYDROXIDE-SIMETH 200-200-20 MG/5ML PO SUSP
30.0000 mL | ORAL | Status: DC | PRN
  Administered 2022-12-27: 30 mL via ORAL
  Filled 2022-12-27: qty 30

## 2022-12-27 NOTE — Plan of Care (Signed)
  Problem: Nutrition: Goal: Adequate nutrition will be maintained Outcome: Adequate for Discharge   

## 2022-12-27 NOTE — Progress Notes (Signed)
    6 Days Post-Op  Subjective: Worse epigastric pain this morning, tolerated some food yesterday  ROS: See above, otherwise other systems negative  Objective: Vital signs in last 24 hours: Temp:  [98 F (36.7 C)-98.7 F (37.1 C)] 98.7 F (37.1 C) (09/11 0505) Pulse Rate:  [76-105] 105 (09/11 0505) Resp:  [18] 18 (09/11 0505) BP: (108-146)/(59-84) 136/80 (09/11 0505) SpO2:  [97 %-98 %] 98 % (09/11 0505) Last BM Date : 12/25/22  Intake/Output from previous day: 09/10 0701 - 09/11 0700 In: 2548.4 [P.O.:1920; IV Piggyback:628.4] Out: 1470 [Urine:900; Drains:570] Intake/Output this shift: No intake/output data recorded.  PE: Gen: anxious and uncomfortable Resp: nonlabored CV: tachycardic Abd: soft, bilious output from drain  Lab Results:  No results for input(s): "WBC", "HGB", "HCT", "PLT" in the last 72 hours. BMET No results for input(s): "NA", "K", "CL", "CO2", "GLUCOSE", "BUN", "CREATININE", "CALCIUM" in the last 72 hours. PT/INR No results for input(s): "LABPROT", "INR" in the last 72 hours. CMP     Component Value Date/Time   NA 137 12/23/2022 0440   NA 139 11/14/2012 0000   K 3.7 12/23/2022 0440   K 4.1 11/14/2012 0000   CL 97 (L) 12/23/2022 0440   CO2 28 12/23/2022 0440   GLUCOSE 131 (H) 12/23/2022 0440   BUN 23 (H) 12/23/2022 0440   BUN 11 11/14/2012 0000   CREATININE 0.81 12/23/2022 0440   CREATININE 0.62 11/14/2012 0000   CALCIUM 8.7 (L) 12/23/2022 0440   PROT 6.8 12/23/2022 0440   ALBUMIN 3.2 (L) 12/23/2022 0440   ALBUMIN 2.6 11/14/2012 0000   AST 18 12/23/2022 0440   ALT 37 12/23/2022 0440   ALKPHOS 58 12/23/2022 0440   BILITOT 0.9 12/23/2022 0440   GFRNONAA >60 12/23/2022 0440   GFRAA >60 12/30/2017 0619   Lipase     Component Value Date/Time   LIPASE 28 08/17/2022 1605    Studies/Results: No results found.  Anti-infectives: Anti-infectives (From admission, onward)    Start     Dose/Rate Route Frequency Ordered Stop   12/21/22  0707  ceFAZolin (ANCEF) 3-0.9 GM/100ML-% IVPB       Note to Pharmacy: Blair Promise B: cabinet override      12/21/22 0707 12/21/22 1914   12/21/22 0600  ceFAZolin (ANCEF) IVPB 2g/100 mL premix  Status:  Discontinued        2 g 200 mL/hr over 30 Minutes Intravenous On call to O.R. 12/21/22 0541 12/21/22 1058       Assessment/Plan  37 yo male s/p partial cholecystectomy   FEN - carb mod diet VTE - lovenox ID - no issues Dispo - PCA for worsening pain, ERCP attempt scheduled for 9/13  I reviewed last 24 h vitals and pain scores, last 48 h intake and output, last 24 h labs and trends, and last 24 h imaging results.  This care required moderate level of medical decision making.    LOS: 5 days   De Blanch [facility] 12/27/2022, 8:37 AM Please see Amion for pager number during day hours 7:00am-4:30pm or 7:00am -11:30am on weekends

## 2022-12-28 ENCOUNTER — Inpatient Hospital Stay (HOSPITAL_COMMUNITY): Payer: Medicaid Other

## 2022-12-28 ENCOUNTER — Other Ambulatory Visit: Payer: Self-pay

## 2022-12-28 DIAGNOSIS — K8010 Calculus of gallbladder with chronic cholecystitis without obstruction: Principal | ICD-10-CM

## 2022-12-28 DIAGNOSIS — A419 Sepsis, unspecified organism: Secondary | ICD-10-CM | POA: Diagnosis not present

## 2022-12-28 DIAGNOSIS — R6521 Severe sepsis with septic shock: Secondary | ICD-10-CM

## 2022-12-28 DIAGNOSIS — J9602 Acute respiratory failure with hypercapnia: Secondary | ICD-10-CM | POA: Diagnosis not present

## 2022-12-28 DIAGNOSIS — J9601 Acute respiratory failure with hypoxia: Secondary | ICD-10-CM | POA: Diagnosis not present

## 2022-12-28 DIAGNOSIS — N179 Acute kidney failure, unspecified: Secondary | ICD-10-CM | POA: Diagnosis not present

## 2022-12-28 LAB — BASIC METABOLIC PANEL
Anion gap: 14 (ref 5–15)
BUN: 25 mg/dL — ABNORMAL HIGH (ref 6–20)
CO2: 19 mmol/L — ABNORMAL LOW (ref 22–32)
Calcium: 7.8 mg/dL — ABNORMAL LOW (ref 8.9–10.3)
Chloride: 100 mmol/L (ref 98–111)
Creatinine, Ser: 2.79 mg/dL — ABNORMAL HIGH (ref 0.61–1.24)
GFR, Estimated: 29 mL/min — ABNORMAL LOW (ref >60)
Glucose, Bld: 112 mg/dL — ABNORMAL HIGH (ref 70–99)
Potassium: 4.1 mmol/L (ref 3.5–5.1)
Sodium: 133 mmol/L — ABNORMAL LOW (ref 135–145)

## 2022-12-28 LAB — BLOOD GAS, ARTERIAL
Acid-base deficit: 6.8 mmol/L — ABNORMAL HIGH (ref 0.0–2.0)
Bicarbonate: 21.6 mmol/L (ref 20.0–28.0)
Drawn by: 270271
O2 Content: 4 L/min
O2 Saturation: 97.1 %
Patient temperature: 37.1
pCO2 arterial: 54 mmHg — ABNORMAL HIGH (ref 32–48)
pH, Arterial: 7.21 — ABNORMAL LOW (ref 7.35–7.45)
pO2, Arterial: 75 mmHg — ABNORMAL LOW (ref 83–108)

## 2022-12-28 LAB — CBC WITH DIFFERENTIAL/PLATELET
Abs Immature Granulocytes: 0.38 10*3/uL — ABNORMAL HIGH (ref 0.00–0.07)
Abs Immature Granulocytes: 1.33 10*3/uL — ABNORMAL HIGH (ref 0.00–0.07)
Basophils Absolute: 0.1 10*3/uL (ref 0.0–0.1)
Basophils Absolute: 0.2 10*3/uL — ABNORMAL HIGH (ref 0.0–0.1)
Basophils Relative: 0 %
Basophils Relative: 1 %
Eosinophils Absolute: 0 10*3/uL (ref 0.0–0.5)
Eosinophils Absolute: 0 10*3/uL (ref 0.0–0.5)
Eosinophils Relative: 0 %
Eosinophils Relative: 0 %
HCT: 44.1 % (ref 39.0–52.0)
HCT: 40.6 % (ref 39.0–52.0)
Hemoglobin: 13.5 g/dL (ref 13.0–17.0)
Hemoglobin: 11.7 g/dL — ABNORMAL LOW (ref 13.0–17.0)
Immature Granulocytes: 2 %
Immature Granulocytes: 4 %
Lymphocytes Relative: 1 %
Lymphocytes Relative: 1 %
Lymphs Abs: 0.2 10*3/uL — ABNORMAL LOW (ref 0.7–4.0)
Lymphs Abs: 0.3 10*3/uL — ABNORMAL LOW (ref 0.7–4.0)
MCH: 28.5 pg (ref 26.0–34.0)
MCH: 27.9 pg (ref 26.0–34.0)
MCHC: 30.6 g/dL (ref 30.0–36.0)
MCHC: 28.8 g/dL — ABNORMAL LOW (ref 30.0–36.0)
MCV: 93 fL (ref 80.0–100.0)
MCV: 96.7 fL (ref 80.0–100.0)
Monocytes Absolute: 0.7 10*3/uL (ref 0.1–1.0)
Monocytes Absolute: 1.5 10*3/uL — ABNORMAL HIGH (ref 0.1–1.0)
Monocytes Relative: 3 %
Monocytes Relative: 5 %
Neutro Abs: 20.5 10*3/uL — ABNORMAL HIGH (ref 1.7–7.7)
Neutro Abs: 28.5 10*3/uL — ABNORMAL HIGH (ref 1.7–7.7)
Neutrophils Relative %: 94 %
Neutrophils Relative %: 89 %
Platelets: 324 10*3/uL (ref 150–400)
Platelets: 316 10*3/uL (ref 150–400)
RBC: 4.74 MIL/uL (ref 4.22–5.81)
RBC: 4.2 MIL/uL — ABNORMAL LOW (ref 4.22–5.81)
RDW: 13.3 % (ref 11.5–15.5)
RDW: 13.7 % (ref 11.5–15.5)
WBC: 21.9 10*3/uL — ABNORMAL HIGH (ref 4.0–10.5)
WBC: 31.8 10*3/uL — ABNORMAL HIGH (ref 4.0–10.5)
nRBC: 0 % (ref 0.0–0.2)
nRBC: 0 % (ref 0.0–0.2)

## 2022-12-28 LAB — COMPREHENSIVE METABOLIC PANEL
ALT: 37 U/L (ref 0–44)
AST: 22 U/L (ref 15–41)
Albumin: 3 g/dL — ABNORMAL LOW (ref 3.5–5.0)
Alkaline Phosphatase: 104 U/L (ref 38–126)
Anion gap: 14 (ref 5–15)
BUN: 17 mg/dL (ref 6–20)
CO2: 24 mmol/L (ref 22–32)
Calcium: 8.8 mg/dL — ABNORMAL LOW (ref 8.9–10.3)
Chloride: 97 mmol/L — ABNORMAL LOW (ref 98–111)
Creatinine, Ser: 1.62 mg/dL — ABNORMAL HIGH (ref 0.61–1.24)
GFR, Estimated: 56 mL/min — ABNORMAL LOW (ref >60)
Glucose, Bld: 135 mg/dL — ABNORMAL HIGH (ref 70–99)
Potassium: 3.9 mmol/L (ref 3.5–5.1)
Sodium: 135 mmol/L (ref 135–145)
Total Bilirubin: 1.8 mg/dL — ABNORMAL HIGH (ref 0.3–1.2)
Total Protein: 7.1 g/dL (ref 6.5–8.1)

## 2022-12-28 LAB — LACTIC ACID, PLASMA
Lactic Acid, Venous: 3.2 mmol/L (ref 0.5–1.9)
Lactic Acid, Venous: 3.3 mmol/L (ref 0.5–1.9)
Lactic Acid, Venous: 2.9 mmol/L (ref 0.5–1.9)
Lactic Acid, Venous: 2.7 mmol/L (ref 0.5–1.9)

## 2022-12-28 LAB — BLOOD GAS, VENOUS
Acid-base deficit: 6.3 mmol/L — ABNORMAL HIGH (ref 0.0–2.0)
Bicarbonate: 23.3 mmol/L (ref 20.0–28.0)
O2 Saturation: 73 %
Patient temperature: 37.7
pCO2, Ven: 66 mmHg — ABNORMAL HIGH (ref 44–60)
pH, Ven: 7.16 — CL (ref 7.25–7.43)
pO2, Ven: 43 mmHg (ref 32–45)

## 2022-12-28 LAB — HEPATIC FUNCTION PANEL
ALT: 36 U/L (ref 0–44)
AST: 33 U/L (ref 15–41)
Albumin: 2.5 g/dL — ABNORMAL LOW (ref 3.5–5.0)
Alkaline Phosphatase: 80 U/L (ref 38–126)
Bilirubin, Direct: 0.9 mg/dL — ABNORMAL HIGH (ref 0.0–0.2)
Indirect Bilirubin: 0.8 mg/dL (ref 0.3–0.9)
Total Bilirubin: 1.7 mg/dL — ABNORMAL HIGH (ref 0.3–1.2)
Total Protein: 6.4 g/dL — ABNORMAL LOW (ref 6.5–8.1)

## 2022-12-28 LAB — CREATININE, SERUM
Creatinine, Ser: 0.67 mg/dL (ref 0.61–1.24)
GFR, Estimated: >60 mL/min (ref >60)

## 2022-12-28 LAB — GLUCOSE, CAPILLARY
Glucose-Capillary: 105 mg/dL — ABNORMAL HIGH (ref 70–99)
Glucose-Capillary: 107 mg/dL — ABNORMAL HIGH (ref 70–99)
Glucose-Capillary: 126 mg/dL — ABNORMAL HIGH (ref 70–99)
Glucose-Capillary: 96 mg/dL (ref 70–99)

## 2022-12-28 LAB — TYPE AND SCREEN
ABO/RH(D): A POS
Antibody Screen: NEGATIVE

## 2022-12-28 LAB — APTT: aPTT: 34 s (ref 24–36)

## 2022-12-28 LAB — MRSA NEXT GEN BY PCR, NASAL: MRSA by PCR Next Gen: DETECTED — AB

## 2022-12-28 LAB — PROTIME-INR
INR: 1.2 (ref 0.8–1.2)
Prothrombin Time: 15.8 s — ABNORMAL HIGH (ref 11.4–15.2)

## 2022-12-28 MED ORDER — CHLORHEXIDINE GLUCONATE CLOTH 2 % EX PADS
6.0000 | MEDICATED_PAD | Freq: Every day | CUTANEOUS | Status: DC
  Administered 2022-12-28 – 2023-01-12 (×13): 6 via TOPICAL

## 2022-12-28 MED ORDER — CHLORHEXIDINE GLUCONATE CLOTH 2 % EX PADS
6.0000 | MEDICATED_PAD | Freq: Every day | CUTANEOUS | Status: AC
  Administered 2022-12-29 – 2023-01-01 (×4): 6 via TOPICAL

## 2022-12-28 MED ORDER — VANCOMYCIN HCL 2000 MG/400ML IV SOLN
2000.0000 mg | Freq: Once | INTRAVENOUS | Status: AC
  Administered 2022-12-28: 2000 mg via INTRAVENOUS
  Filled 2022-12-28: qty 400

## 2022-12-28 MED ORDER — LACTATED RINGERS IV BOLUS
1000.0000 mL | Freq: Once | INTRAVENOUS | Status: AC
  Administered 2022-12-28: 1000 mL via INTRAVENOUS

## 2022-12-28 MED ORDER — SODIUM CHLORIDE 0.9 % IV BOLUS
1000.0000 mL | Freq: Once | INTRAVENOUS | Status: AC
  Administered 2022-12-28: 1000 mL via INTRAVENOUS

## 2022-12-28 MED ORDER — SODIUM BICARBONATE 8.4 % IV SOLN
INTRAVENOUS | Status: DC
  Filled 2022-12-28: qty 150

## 2022-12-28 MED ORDER — VANCOMYCIN VARIABLE DOSE PER UNSTABLE RENAL FUNCTION (PHARMACIST DOSING): Status: DC

## 2022-12-28 MED ORDER — INSULIN ASPART 100 UNIT/ML IJ SOLN
0.0000 [IU] | INTRAMUSCULAR | Status: DC
  Administered 2022-12-29: 3 [IU] via SUBCUTANEOUS
  Administered 2022-12-29 (×3): 4 [IU] via SUBCUTANEOUS
  Administered 2022-12-30 – 2023-01-03 (×11): 3 [IU] via SUBCUTANEOUS
  Administered 2023-01-03: 4 [IU] via SUBCUTANEOUS
  Administered 2023-01-04 – 2023-01-07 (×9): 3 [IU] via SUBCUTANEOUS
  Administered 2023-01-08: 4 [IU] via SUBCUTANEOUS
  Administered 2023-01-10 (×3): 3 [IU] via SUBCUTANEOUS
  Administered 2023-01-10: 4 [IU] via SUBCUTANEOUS
  Administered 2023-01-10 – 2023-01-12 (×7): 3 [IU] via SUBCUTANEOUS

## 2022-12-28 MED ORDER — SODIUM BICARBONATE 8.4 % IV SOLN
50.0000 meq | Freq: Once | INTRAVENOUS | Status: AC
  Administered 2022-12-28: 50 meq via INTRAVENOUS

## 2022-12-28 MED ORDER — SODIUM CHLORIDE 0.9 % IV SOLN: 2.0000 g | Freq: Two times a day (BID) | INTRAVENOUS | Status: DC

## 2022-12-28 MED ORDER — IOHEXOL 300 MG/ML  SOLN
100.0000 mL | Freq: Once | INTRAMUSCULAR | Status: AC | PRN
  Administered 2022-12-28: 100 mL via INTRAVENOUS

## 2022-12-28 MED ORDER — INSULIN ASPART 100 UNIT/ML IJ SOLN
0.0000 [IU] | Freq: Three times a day (TID) | INTRAMUSCULAR | Status: DC
  Administered 2022-12-28: 2 [IU] via SUBCUTANEOUS

## 2022-12-28 MED ORDER — SODIUM CHLORIDE 0.9 % IV SOLN
2.0000 g | Freq: Three times a day (TID) | INTRAVENOUS | Status: DC
  Filled 2022-12-28: qty 12.5

## 2022-12-28 MED ORDER — SODIUM CHLORIDE 0.9 % IV SOLN: 250.0000 mL | INTRAVENOUS | Status: DC

## 2022-12-28 MED ORDER — PANTOPRAZOLE SODIUM 40 MG IV SOLR
40.0000 mg | Freq: Two times a day (BID) | INTRAVENOUS | Status: DC
  Administered 2022-12-28 – 2023-01-12 (×30): 40 mg via INTRAVENOUS
  Filled 2022-12-28 (×29): qty 10

## 2022-12-28 MED ORDER — METRONIDAZOLE 500 MG/100ML IV SOLN
500.0000 mg | Freq: Two times a day (BID) | INTRAVENOUS | Status: DC
  Administered 2022-12-28 – 2023-01-12 (×30): 500 mg via INTRAVENOUS
  Filled 2022-12-28 (×30): qty 100

## 2022-12-28 MED ORDER — NOREPINEPHRINE 4 MG/250ML-% IV SOLN
INTRAVENOUS | Status: AC
  Administered 2022-12-28: 2 ug/min via INTRAVENOUS
  Filled 2022-12-28: qty 250

## 2022-12-28 MED ORDER — SODIUM BICARBONATE 8.4 % IV SOLN
INTRAVENOUS | Status: AC
  Filled 2022-12-28: qty 50

## 2022-12-28 MED ORDER — ACETAMINOPHEN 10 MG/ML IV SOLN: 1000.0000 mg | Freq: Once | INTRAVENOUS | Status: DC

## 2022-12-28 MED ORDER — PHENYLEPHRINE HCL-NACL 20-0.9 MG/250ML-% IV SOLN
25.0000 ug/min | INTRAVENOUS | Status: DC
  Administered 2022-12-28: 25 ug/min via INTRAVENOUS
  Administered 2022-12-29: 200 ug/min via INTRAVENOUS
  Filled 2022-12-28 (×3): qty 250

## 2022-12-28 MED ORDER — MUPIROCIN 2 % EX OINT
1.0000 | TOPICAL_OINTMENT | Freq: Two times a day (BID) | CUTANEOUS | Status: AC
  Administered 2022-12-29 – 2023-01-02 (×10): 1 via NASAL
  Filled 2022-12-28 (×2): qty 22

## 2022-12-28 MED ORDER — SODIUM CHLORIDE 0.9 % IV SOLN: INTRAVENOUS | Status: DC

## 2022-12-28 MED ORDER — NOREPINEPHRINE 4 MG/250ML-% IV SOLN
2.0000 ug/min | INTRAVENOUS | Status: DC
  Filled 2022-12-28 (×2): qty 250

## 2022-12-28 MED ORDER — SODIUM BICARBONATE 8.4 % IV SOLN
INTRAVENOUS | Status: AC
  Administered 2022-12-28: 50 meq
  Filled 2022-12-28: qty 50

## 2022-12-28 MED ORDER — IOHEXOL 9 MG/ML PO SOLN
500.0000 mL | ORAL | Status: AC
  Administered 2022-12-28 (×2): 500 mL via ORAL

## 2022-12-28 MED ORDER — MIDODRINE HCL 5 MG PO TABS
10.0000 mg | ORAL_TABLET | ORAL | Status: AC
  Administered 2022-12-28: 10 mg via ORAL
  Filled 2022-12-28: qty 2

## 2022-12-28 MED ORDER — PIPERACILLIN-TAZOBACTAM 3.375 G IVPB
3.3750 g | Freq: Three times a day (TID) | INTRAVENOUS | Status: DC
  Administered 2022-12-28: 3.375 g via INTRAVENOUS
  Filled 2022-12-28: qty 50

## 2022-12-28 MED ORDER — ACETAMINOPHEN 10 MG/ML IV SOLN
1000.0000 mg | Freq: Once | INTRAVENOUS | Status: AC
  Administered 2022-12-28: 1000 mg via INTRAVENOUS
  Filled 2022-12-28: qty 100

## 2022-12-28 MED ORDER — IOHEXOL 9 MG/ML PO SOLN
ORAL | Status: AC
  Filled 2022-12-28: qty 1000

## 2022-12-28 MED ORDER — VANCOMYCIN HCL 1250 MG/250ML IV SOLN
1250.0000 mg | Freq: Two times a day (BID) | INTRAVENOUS | Status: DC
  Filled 2022-12-28: qty 250

## 2022-12-28 NOTE — Progress Notes (Signed)
Pharmacy Antibiotic Note  [name] is a 37 y.o. male admitted on 12/21/2022 with pneumonia.  Pharmacy has been consulted for Zosyn dosing.  Plan: Zosyn 3.375g IV q8h (4 hour infusion).  Height: 5\' 7"  (170.2 cm) Weight: (!) 235 kg (518 lb 1.3 oz) IBW/kg (Calculated) : 66.1  Temp (24hrs), Avg:98.7 F (37.1 C), Min:98.3 F (36.8 C), Max:99.4 F (37.4 C)  Recent Labs  Lab 12/22/22 0455 12/23/22 0440 12/27/22 0946 12/28/22 0457  WBC 23.3* 14.0* 17.5*  --   CREATININE 0.65 0.81 0.60* 0.67    Estimated Creatinine Clearance: 239.1 mL/min (by C-G formula based on SCr of 0.67 mg/dL).    Allergies  Allergen Reactions   Asa [Aspirin] Other (See Comments)    Pt doesn't know reaction.   Clindamycin/Lincomycin     Gastric ulcer     Nsaids Other (See Comments)    Causes Ulcers    Penicillins Other (See Comments)    Pt doesn't know reaction.     Sulfa Antibiotics Other (See Comments)    Pt doesn't know reaction.     Thank you for allowing pharmacy to be a part of this patient's care.  [name] 12/28/2022 11:15 AM

## 2022-12-28 NOTE — Consult Note (Addendum)
NAME:  [name], MRN:  [id number], DOB:  19-Nov-1985, LOS: 6 ADMISSION DATE:  12/21/2022, CONSULTATION DATE:  9 REFERRING MD:  12/28/22, CHIEF COMPLAINT:  Circulatory shock, SIRS     History of Present Illness:   37 year old extremely obese BMI 57 gentleman (ORPHAN) with history of erosive esophagitis and biliary colic.  He reports complete bed bound status for several months prior to admission. Unable to lie flat x 3 months. Admitted on 12/21/2022 for elective laparoscopic cholecystectomy and status post partial cholecystectomy and biliary drain placement on the day.  Course  12/23/2022: Significant postoperative pain.  But it improved.  CPAP nightly started.  Getting modified diet.  12/24/2022 improved pain.  Tolerated some p.o.  Drain improved but remains bilious  12/25/2022: Persistent pain tolerating small oral feeds.  12/26/2022: Atypical chest pain.  Continue drain management improving oral diet.  911/2024 worsening epigastric pain.  Normal lipase.  PCA for pain started.  ERCP scheduled for 12/29/2022.  12/28/2022: Low oxygen saturations noted patient refusing CPAP made.  Made NPO. Left upper abdominal pain : Worsened white 9 to 14-17,000.  No temperature spikes.  ERCP planned for 12/29/2022 with stent placement.  Concern for persistent bile leak.  Later in the day febrile with rising white count to 32,000 and onset of hypotension./Shock.  Requiring Levophed and Neo-Synephrine.  2 peripheral IVs.  Onset of hypoxemia and worsening requiring 5 L.  Initially hospitalist medicine consulted started on vancomycin, cefepime and Flagyl.  Due to persistent shock critical care medicine consulted.   Past Medical History:    has a past medical history of Acute blood loss anemia (09/02/2012), Cellulitis, Erosive esophagitis (09/01/2012), Gall stones, Gastric ulcer with hemorrhage (09/02/2012), Gout, Obesity, and UTI (lower urinary tract infection).   reports that he has been smoking cigarettes. He has  a 7 pack-year smoking history. His smokeless tobacco use includes snuff.  Past Surgical History:  Procedure Laterality Date   CHOLECYSTECTOMY N/A 12/21/2022   Procedure: LAPAROSCOPIC CHOLECYSTECTOMY;  Surgeon: Kinsinger, De Blanch, MD;  Location: [facility];  Service: General;  Laterality: N/A;   COLONOSCOPY WITH PROPOFOL N/A 07/30/2014   RMR: Colonoscopy anal canal/internal hemorrhoids, otherwise normal ileocolonoscoopy.    ESOPHAGOGASTRODUODENOSCOPY Left 09/01/2012   WUJ:WJXBJY esophageal erosions consistent with erosive reflux esophagitis/Pre-pyloric benign-appearing gastric ulcer with bleeding stigmata status post bleeding control therapy as described above   ESOPHAGOGASTRODUODENOSCOPY N/A 12/13/2012   NWG:NFAOZH hernia. PReviously noted gastric ulcer completely healed.    ESOPHAGOGASTRODUODENOSCOPY (EGD) WITH PROPOFOL N/A 07/30/2014   Procedure: ESOPHAGOGASTRODUODENOSCOPY (EGD) WITH PROPOFOL;  Surgeon: Corbin Ade, MD;  Location: [facility];  Service: Endoscopy;  Laterality: N/A;   TONSILLECTOMY      Allergies  Allergen Reactions   Asa [Aspirin] Other (See Comments)    Pt doesn't know reaction.   Clindamycin/Lincomycin     Gastric ulcer     Nsaids Other (See Comments)    Causes Ulcers    Penicillins Other (See Comments)    Pt doesn't know reaction.     Sulfa Antibiotics Other (See Comments)    Pt doesn't know reaction.    Immunization History  Administered Date(s) Administered   Influenza,inj,Quad PF,6+ Mos 12/30/2017    Family History  Problem Relation Age of Onset   Heart failure Mother    Colon cancer Neg Hx      Current Facility-Administered Medications:    0.9 %  sodium chloride infusion, , Intravenous, Continuous, Kirby Crigler, Mir M, MD, Stopped at 12/28/22 1937   0.9 %  sodium chloride infusion, 250 mL, Intravenous, Continuous, Chavez, Abigail, NP   0.9 %  sodium chloride infusion, 250 mL, Intravenous, Continuous, Ogan, Okoronkwo U, MD   acetaminophen (OFIRMEV) IV  1,000 mg, 1,000 mg, Intravenous, Once, Ogan, Okoronkwo U, MD, Last Rate: 400 mL/hr at 12/28/22 2319, 1,000 mg at 12/28/22 2319   acetaminophen (TYLENOL) tablet 1,000 mg, 1,000 mg, Oral, TID, Fritzi Mandes, MD, 1,000 mg at 12/28/22 1454   allopurinol (ZYLOPRIM) tablet 100 mg, 100 mg, Oral, Daily, Kinsinger, De Blanch, MD, 100 mg at 12/28/22 0813   alum & mag hydroxide-simeth (MAALOX/MYLANTA) 200-200-20 MG/5ML suspension 30 mL, 30 mL, Oral, Q4H PRN, Kinsinger, De Blanch, MD, 30 mL at 12/27/22 0808   [START ON 12/29/2022] ceFEPIme (MAXIPIME) 2 g in sodium chloride 0.9 % 100 mL IVPB, 2 g, Intravenous, Q12H, Pham, Anh P, RPH   Chlorhexidine Gluconate Cloth 2 % PADS 6 each, 6 each, Topical, Daily, Kinsinger, De Blanch, MD, 6 each at 12/28/22 1119   [START ON 12/29/2022] Chlorhexidine Gluconate Cloth 2 % PADS 6 each, 6 each, Topical, Q0600, Kinsinger, De Blanch, MD   diphenhydrAMINE (BENADRYL) capsule 25 mg, 25 mg, Oral, Q6H PRN **OR** diphenhydrAMINE (BENADRYL) injection 25 mg, 25 mg, Intravenous, Q6H PRN, Kinsinger, De Blanch, MD   enoxaparin (LOVENOX) injection 40 mg, 40 mg, Subcutaneous, Q24H, Kinsinger, De Blanch, MD, 40 mg at 12/28/22 0813   gabapentin (NEURONTIN) capsule 300 mg, 300 mg, Oral, QID, Kinsinger, De Blanch, MD, 300 mg at 12/28/22 1600   hydrocortisone (ANUSOL-HC) 2.5 % rectal cream 1 Application, 1 Application, Rectal, BID, Kinsinger, De Blanch, MD, 1 Application at 12/24/22 2201   hydrocortisone cream 1 %, , Topical, TID PRN, Kinsinger, De Blanch, MD   HYDROmorphone (DILAUDID) 1 mg/mL PCA injection, , Intravenous, Q4H, Kinsinger, De Blanch, MD, 0.9 mg at 12/28/22 2000   insulin aspart (novoLOG) injection 0-15 Units, 0-15 Units, Subcutaneous, TID WC, Kinsinger, De Blanch, MD, 2 Units at 12/28/22 1140   lidocaine (LIDODERM) 5 % 1 patch, 1 patch, Transdermal, Daily, Kinsinger, De Blanch, MD, 1 patch at 12/24/22 1033   melatonin tablet 3 mg, 3 mg, Oral, QHS PRN, Kinsinger, De Blanch, MD   methocarbamol (ROBAXIN) 1,000 mg in dextrose 5 % 100 mL IVPB, 1,000 mg, Intravenous, Q6H, Fritzi Mandes, MD, Stopped at 12/28/22 1901   metroNIDAZOLE (FLAGYL) IVPB 500 mg, 500 mg, Intravenous, Q12H, Pham, Anh P, RPH, Last Rate: 100 mL/hr at 12/28/22 2130, 500 mg at 12/28/22 2130   mupirocin ointment (BACTROBAN) 2 % 1 Application, 1 Application, Nasal, BID, Kinsinger, De Blanch, MD   naloxone [facility]) injection 0.4 mg, 0.4 mg, Intravenous, PRN **AND** sodium chloride flush (NS) 0.9 % injection 9 mL, 9 mL, Intravenous, PRN, Kinsinger, De Blanch, MD   norepinephrine (LEVOPHED) 4mg  in (0.016 mg/mL) premix infusion, 2-10 mcg/min, Intravenous, Titrated, Anthoney Harada, NP, Last Rate: 37.5 mL/hr at 12/28/22 2321, 10 mcg/min at 12/28/22 2321   ondansetron (ZOFRAN-ODT) disintegrating tablet 4 mg, 4 mg, Oral, Q6H PRN **OR** ondansetron (ZOFRAN) injection 4 mg, 4 mg, Intravenous, Q6H PRN, Kinsinger, De Blanch, MD, 4 mg at 12/28/22 1610   oxyCODONE (Oxy IR/ROXICODONE) immediate release tablet 5-10 mg, 5-10 mg, Oral, Q4H PRN, Kinsinger, De Blanch, MD, 10 mg at 12/27/22 2053   pantoprazole (PROTONIX) EC tablet 40 mg, 40 mg, Oral, BID AC, Kinsinger, De Blanch, MD, 40 mg at 12/28/22 1600   phenylephrine (NEO-SYNEPHRINE) 20mg /NS premix infusion, 25-200 mcg/min, Intravenous, Titrated, Ogan, Okoronkwo U, MD, Last Rate: 18.75 mL/hr at  12/28/22 2118, 25 mcg/min at 12/28/22 2118   vancomycin variable dose per unstable renal function (pharmacist dosing), , Does not apply, See admin instructions, Lucia Gaskins, [facility]     Significant Hospital Events:  12/21/2022 - admit   12/23/2022: Significant postoperative pain.  But it improved.  CPAP nightly started.  Getting modified diet.  12/24/2022 improved pain.  Tolerated some p.o.  Drain improved but remains bilious  12/25/2022: Persistent pain tolerating small oral feeds.  12/26/2022: Atypical chest pain.  Continue drain management improving oral  diet.  911/2024 worsening epigastric pain.  Normal lipase.  PCA for pain started.  ERCP scheduled for 12/29/2022.  12/28/2022: Low oxygen saturations noted patient refusing CPAP made.  Made NPO. Left upper abdominal pain : Worsened white 9 to 14-17,000.  No temperature spikes.  ERCP planned for 12/29/2022 with stent placement.  Concern for persistent bile leak.  Later in the day febrile with rising white count to 32,000 and onset of hypotension./Shock.  Requiring Levophed and Neo-Synephrine.  2 peripheral IVs.  Onset of hypoxemia and worsening requiring 5 L.  Initially hospitalist medicine consulted started on vancomycin, cefepime and Flagyl.  Due to persistent shock critical care medicine consulted. Also worsening AKI.   CT [facility]: no undrained fluid, Drain in good poisition. No perf. No clear abd source of sepsis   Interim History / Subjective:   12/28/2022 - seen in bed 1236  Objective   Blood pressure (!) 80/35, pulse (!) 146, temperature (!) 100.5 F (38.1 C), temperature source Oral, resp. rate (!) 28, height 5\' 7"  (1.702 m), weight (!) 235 kg, SpO2 93%.    Body mass index is 81.14 kg/m.   FiO2 (%):  [91 %] 91 %   Intake/Output Summary (Last 24 hours) at 12/28/2022 2323 Last data filed at 12/28/2022 2107 Gross per 24 hour  Intake 5610.77 ml  Output 975 ml  Net 4635.77 ml   Filed Weights   12/21/22 0543  Weight: (!) 235 kg    Examination: General: Morbidly obese. BMI 81 HENT: Mallampatti Class 3 Lungs: [place] 5L . Tachypenic . Shallow. NOT paradoxical Cardiovascular: Sinus tachycaria. Normal heart sounds Abdomen: Morbidly obese Extremities: short, obese, Moves them Neuro: Alert, in some pain, Anxius looing GU: not examined  A-LINE : LLot of pulrse pressure variation . Cuff better  Resolved Hospital Problem list   x  Assessment & Plan:   Baseline : OSA likely Acute hypoxemic and hypercapnic respiratory acidosis failure with infilatres - ALI/HCAP  v pulm edema - onset  9.12.24   P:   [place] o2 for pusle ox > 92% BiPAP -(he refused but after counseling he agreed) Intubate if worse     Post oop pain in hospital needing PCA   P:   Low dose prn fentanyl if needed If intubated then PAD protocol     Septic shock onset 9/.12/24 and needing  2 pressors via PIV- a-line wth pulssus alternans  - cuff and aline not corrleating  P:  cVL -> CVP monitoring PICC Ordered Levophed  Neo Vasopressin Hydrocort (after cecking cortisol)  Check coox  If reflractory cnsider methyelene blue   MAP goal > 65      Sepsis syndrome 12/28/22. Per [facility]  - likely not acute abdomen. ERCP pending though 12/29/22.HCAP in ddx   P:   Cefepime 12/28/22 Vanc 12/28/22 Flagyl  Await blood culture 12/28/22 Check Procalcitonin   AKi onset 12/28/22 due to sepsis   P:  MAP goal > 65  Lactic acidosis likely due to sepsis  P Rx underlying cause Monitor       s/p partial cholecystectomy, persistent bile output  Abd pain LUQ 12/28/22 but CT Abd negative. Posted for ERCP 12/29/22   P:   Continiue RUQ biliary drain NPO but for sips (he wants to drink water and some sips will allow) [place] ERCP 12/29/22 but ? Stable Per [facility] and GI    Anemia of critical illness - onsent 12/28/22   P:  - PRBC for hgb </= 6.9gm%    - exceptions are   -  if ACS susepcted/confirmed then transfuse for hgb </= 8.0gm%,  or    -  active bleeding with hemodynamic instability, then transfuse regardless of hemoglobin value   At at all times try to transfuse 1 unit prbc as possible with exception of active hemorrhage     Morbid Obesity BMI 81 At risk for hypo and hyperglceuya     P:   ssi     Best practice (daily eval):  Diet: npo Pain/Anxiety/Delirium protocol (if indicated): fent prn if needee VAP protocol (if indicated): x DVT prophylaxis: lovenox GI prophylaxis: ppi porotonix bid Glucose control: ssi Mobility: bed rest Code Status: full code Disposition: 1236  [place] full ICU    Family Updates:   - 9/12/24Heather Pascal [phone] sister - called 23:48 on 12/28/22 -  12/28/22 Lucia Gaskins White aunt [phone] - called 23:49 and updated     ATTESTATION & SIGNATURE   The patient RAFIK GILLEY is critically ill with multiple organ systems failure and requires high complexity decision making for assessment and support, frequent evaluation and titration of therapies, application of advanced monitoring technologies and extensive interpretation of multiple databases.   Critical Care Time devoted to patient care services described in this note is  90  Minutes. This time reflects time of care of this signee Dr Kalman Shan. This critical care time does not reflect procedure time, or teaching time or supervisory time of PA/NP/Med student/Med Resident etc but could involve care discussion time      SIGNATURE    Dr. Kalman Shan, M.D., F.C.C.P,  Pulmonary and Critical Care Medicine Staff Physician, [facility] Director - Interstitial Lung Disease  Program  Pulmonary Fibrosis [facility] at [place], [place], [address]  NPI Number:  NPI #[id number]  Pager: [phone], If no answer  -> Check AMION or Try [phone] Telephone (clinical office): [phone] Telephone (research): [phone]  11:23 PM 12/28/2022   12/28/2022 11:23 PM    LABS    PULMONARY Recent Labs  Lab 12/28/22 2045 12/28/22 2104  PHART 7.21*  --   PCO2ART 54*  --   PO2ART 75*  --   HCO3 21.6 23.3  O2SAT 97.1 73    CBC Recent Labs  Lab 12/27/22 0946 12/28/22 1147 12/28/22 2112  HGB 13.4 13.5 11.7*  HCT 42.5 44.1 40.6  WBC 17.5* 21.9* 31.8*  PLT 347 324 316    COAGULATION Recent Labs  Lab 12/28/22 1147  INR 1.2    CARDIAC  No results for input(s): "TROPONINI" in the last 168 hours. No results for input(s): "PROBNP" in the last 168 hours.  CHEMISTRY Recent Labs  Lab  12/22/22 0455 12/23/22 0440 12/27/22 0946 12/28/22 0457 12/28/22 1147 12/28/22 2039  NA 137 137 135  --  135 133*  K 3.9 3.7 3.9  --  3.9 4.1  CL 97* 97* 97*  --  97* 100  CO2 29 28 25   --  24 19*  GLUCOSE 171* 131* 209*  --  135* 112*  BUN 14 23* 12  --  17 25*  CREATININE 0.65 0.81 0.60* 0.67 1.62* 2.79*  CALCIUM 8.7* 8.7* 8.7*  --  8.8* 7.8*   Estimated Creatinine Clearance: 68.6 mL/min (A) (by C-G formula based on SCr of 2.79 mg/dL (H)).   LIVER Recent Labs  Lab 12/22/22 0455 12/23/22 0440 12/27/22 0946 12/28/22 1147 12/28/22 2155  AST 30 18 23 22  33  ALT 47* 37 35 37 36  ALKPHOS 64 58 88 104 80  BILITOT 1.0 0.9 0.8 1.8* 1.7*  PROT 7.3 6.8 7.7 7.1 6.4*  ALBUMIN 3.3* 3.2* 3.3* 3.0* 2.5*  INR  --   --   --  1.2  --      INFECTIOUS Recent Labs  Lab 12/28/22 1312 12/28/22 1636 12/28/22 2039  LATICACIDVEN 3.3* 2.9* 2.7*     ENDOCRINE CBG (last 3)  Recent Labs    12/28/22 1129 12/28/22 1636 12/28/22 2143  GLUCAP 126* 96 105*         IMAGING x48h  - image(s) personally visualized  -   highlighted in bold

## 2022-12-28 NOTE — Progress Notes (Signed)
Elink is following sepsis bundle. 

## 2022-12-28 NOTE — Progress Notes (Signed)
RN confirmed patient currently has ultrasound I.V for pressor.

## 2022-12-28 NOTE — Progress Notes (Signed)
7 Days Post-Op  Subjective: Lower O2 saturations overnight and this morning. Attempted CPAP earlier in the hospitalization however patient does not want it. Persistent pain issues. Drain less during dayshift yesterday, more overnight  ROS: See above, otherwise other systems negative  Objective: Vital signs in last 24 hours: Temp:  [98.3 F (36.8 C)-99 F (37.2 C)] 98.9 F (37.2 C) (09/12 0612) Pulse Rate:  [106-126] 126 (09/12 0612) Resp:  [18-24] 24 (09/12 0807) BP: (136-166)/(83-104) 154/86 (09/12 0612) SpO2:  [90 %-98 %] 90 % (09/12 0807) FiO2 (%):  [91 %] 91 % (09/12 0559) Last BM Date : 12/25/22  Intake/Output from previous day: 09/11 0701 - 09/12 0700 In: 849.6 [P.O.:410; I.V.:31.5; IV Piggyback:408.1] Out: 1230 [Urine:800; Emesis/NG output:300; Drains:130] Intake/Output this shift: Total I/O In: -  Out: 40 [Drains:40]  PE: Gen: uncomfortable Resp: nonlabored CV: tachycardic Abd: soft, drain with bilious output, incisions c/d/i  Lab Results:  Recent Labs    12/27/22 0946  WBC 17.5*  HGB 13.4  HCT 42.5  PLT 347   BMET Recent Labs    12/27/22 0946 12/28/22 0457  NA 135  --   K 3.9  --   CL 97*  --   CO2 25  --   GLUCOSE 209*  --   BUN 12  --   CREATININE 0.60* 0.67  CALCIUM 8.7*  --    PT/INR No results for input(s): "LABPROT", "INR" in the last 72 hours. CMP     Component Value Date/Time   NA 135 12/27/2022 0946   NA 139 11/14/2012 0000   K 3.9 12/27/2022 0946   K 4.1 11/14/2012 0000   CL 97 (L) 12/27/2022 0946   CO2 25 12/27/2022 0946   GLUCOSE 209 (H) 12/27/2022 0946   BUN 12 12/27/2022 0946   BUN 11 11/14/2012 0000   CREATININE 0.67 12/28/2022 0457   CREATININE 0.62 11/14/2012 0000   CALCIUM 8.7 (L) 12/27/2022 0946   PROT 7.7 12/27/2022 0946   ALBUMIN 3.3 (L) 12/27/2022 0946   ALBUMIN 2.6 11/14/2012 0000   AST 23 12/27/2022 0946   ALT 35 12/27/2022 0946   ALKPHOS 88 12/27/2022 0946   BILITOT 0.8 12/27/2022 0946   GFRNONAA  >60 12/28/2022 0457   GFRAA >60 12/30/2017 0619   Lipase     Component Value Date/Time   LIPASE 28 08/17/2022 1605    Studies/Results: No results found.  Anti-infectives: Anti-infectives (From admission, onward)    Start     Dose/Rate Route Frequency Ordered Stop   12/21/22 0707  ceFAZolin (ANCEF) 3-0.9 GM/100ML-% IVPB       Note to Pharmacy: [facility]: cabinet override      12/21/22 0707 12/21/22 1914   12/21/22 0600  ceFAZolin (ANCEF) IVPB 2g/100 mL premix  Status:  Discontinued        2 g 200 mL/hr over 30 Minutes Intravenous On call to O.R. 12/21/22 0541 12/21/22 1058       Assessment/Plan  37 yo male s/p partial cholecystectomy   FEN - npo until after imaging VTE - continue lovenox ID - getting CT scan to eval for secondary infections Dispo - CT scans today, endoscopy planned for tomorrow  I reviewed last 24 h vitals and pain scores, last 48 h intake and output, last 24 h labs and trends, and last 24 h imaging results.  This care required high  level of medical decision making.    LOS: 6 days   De Blanch [facility]  [facility] 12/28/2022, 8:34 AM Please see Amion for pager number during day hours 7:00am-4:30pm or 7:00am -11:30am on weekends

## 2022-12-28 NOTE — Progress Notes (Signed)
Date and time results received: 12/28/22 1545 (use smartphrase ".now" to insert current time)  Test: MRSA PCR Critical Value: Positive  Name of Provider Notified: Dr. Kirby Crigler  Orders Received? Or Actions Taken?:  Standing orders placed.

## 2022-12-28 NOTE — Progress Notes (Signed)
Subjective: Left sided upper abdominal pain.  Objective: Vital signs in last 24 hours: Temp:  [98.3 F (36.8 C)-99.4 F (37.4 C)] 99.4 F (37.4 C) (09/12 0941) Pulse Rate:  [106-144] 144 (09/12 0941) Resp:  [18-26] 26 (09/12 0941) BP: (82-166)/(60-104) 82/60 (09/12 0941) SpO2:  [90 %-98 %] 91 % (09/12 0941) FiO2 (%):  [91 %] 91 % (09/12 0559) Last BM Date : 12/25/22  Intake/Output from previous day: 09/11 0701 - 09/12 0700 In: 849.6 [P.O.:410; I.V.:31.5; IV Piggyback:408.1] Out: 1230 [Urine:800; Emesis/NG output:300; Drains:130] Intake/Output this shift: Total I/O In: 0  Out: 60 [Drains:60]  General appearance: uncomfortable GI: Tender in the left upper abdomen and at the surgical incisions.  Lab Results: Recent Labs    12/27/22 0946  WBC 17.5*  HGB 13.4  HCT 42.5  PLT 347   BMET Recent Labs    12/27/22 0946 12/28/22 0457  NA 135  --   K 3.9  --   CL 97*  --   CO2 25  --   GLUCOSE 209*  --   BUN 12  --   CREATININE 0.60* 0.67  CALCIUM 8.7*  --    LFT Recent Labs    12/27/22 0946  PROT 7.7  ALBUMIN 3.3*  AST 23  ALT 35  ALKPHOS 88  BILITOT 0.8   PT/INR No results for input(s): "LABPROT", "INR" in the last 72 hours. Hepatitis Panel No results for input(s): "HEPBSAG", "HCVAB", "HEPAIGM", "HEPBIGM" in the last 72 hours. C-Diff No results for input(s): "CDIFFTOX" in the last 72 hours. Fecal Lactopherrin No results for input(s): "FECLLACTOFRN" in the last 72 hours.  Studies/Results: No results found.  Medications: Scheduled:  acetaminophen  1,000 mg Oral TID   allopurinol  100 mg Oral Daily   enoxaparin (LOVENOX) injection  40 mg Subcutaneous Q24H   gabapentin  300 mg Oral QID   hydrocortisone  1 Application Rectal BID   HYDROmorphone   Intravenous Q4H   insulin aspart  0-15 Units Subcutaneous TID WC   iohexol  500 mL Oral Q1H   lidocaine  1 patch Transdermal Daily   pantoprazole  40 mg Oral BID AC   Continuous:  methocarbamol (ROBAXIN)  IV 1,000 mg (12/28/22 0646)    Assessment/Plan: 1) Left upper abdominal pain. 2) Persistent bile leak. 3) Leukocytosis.   The patient reports a new pain in the left side of his abdomen.  There was tenderness to palpation in that area.  His WBC did increase from 14 to 17K.  No spike with fevers.  His JP drainage did slow down comparatively.  Plan: 1) ERCP with stent placement tomorrow in the OR. 2) Agree with CT scan of the abdomen.  LOS: 6 days   Dawne Casali D 12/28/2022, 10:01 AM

## 2022-12-28 NOTE — Procedures (Signed)
Arterial Catheter Insertion Procedure Note  [name]  [id number]  1985/05/06  Date:12/28/22  Time:10:33 PM    Provider Performing: Valentino Nose    Procedure: Insertion of Arterial Line (40981) with US guidance (19147)   Indication(s) Blood pressure monitoring and/or need for frequent ABGs  Consent Risks of the procedure as well as the alternatives and risks of each were explained to the patient and/or caregiver.  Consent for the procedure was obtained and is signed in the bedside chart  Anesthesia None   Time Out Verified patient identification, verified procedure, site/side was marked, verified correct patient position, special equipment/implants available, medications/allergies/relevant history reviewed, required imaging and test results available.   Sterile Technique Maximal sterile technique including full sterile barrier drape, hand hygiene, sterile gown, sterile gloves, mask, hair covering, sterile ultrasound probe cover (if used).   Procedure Description Area of catheter insertion was cleaned with chlorhexidine and draped in sterile fashion. With real-time ultrasound guidance an arterial catheter was placed into the left radial artery.  Appropriate arterial tracings confirmed on monitor.     Complications/Tolerance None; patient tolerated the procedure well.   EBL Minimal   Specimen(s) None

## 2022-12-28 NOTE — Progress Notes (Signed)
Alerted by NT of pts vitals triggering a red MEWS. Assessed pt with charge nurse and RR called d/t pts BP, persistent tachycardia, and labored, tachypneic breathing. Pt transported to CT emergently and transferred to step-down unit.     12/28/22 0941  Assess: MEWS Score  Temp 99.4 F (37.4 C)  BP (!) 82/60  MAP (mmHg) 69  Pulse Rate (!) 144  Resp (!) 26  SpO2 91 %  O2 Device Nasal Cannula  O2 Flow Rate (L/min) 3 L/min  Assess: MEWS Score  MEWS Temp 0  MEWS Systolic 1  MEWS Pulse 3  MEWS RR 2  MEWS LOC 0  MEWS Score 6  MEWS Score Color Red  Assess: if the MEWS score is Yellow or Red  Were vital signs accurate and taken at a resting state? Yes  Does the patient meet 2 or more of the SIRS criteria? Yes  Does the patient have a confirmed or suspected source of infection? Yes  MEWS guidelines implemented  Yes, red  Treat  MEWS Interventions Considered administering scheduled or prn medications/treatments as ordered  Take Vital Signs  Increase Vital Sign Frequency  Red: Q1hr x2, continue Q4hrs until patient remains green for 12hrs  Escalate  MEWS: Escalate Red: Discuss with charge nurse and notify provider. Consider notifying RRT. If remains red for 2 hours consider need for higher level of care  Notify: Charge Nurse/RN  Name of Charge Nurse/RN Notified Arminda Resides, RN  Provider Notification  Provider Name/Title Feliciana Rossetti, MD  Date Provider Notified 12/28/22  Time Provider Notified 1005  Method of Notification Page  Notification Reason Change in status  Provider response Evaluate remotely;See new orders  Date of Provider Response 12/28/22  Time of Provider Response 1005  Notify: Rapid Response  Name of Rapid Response RN Notified Deborah, RN  Date Rapid Response Notified 12/28/22  Time Rapid Response Notified 0955  Assess: SIRS CRITERIA  SIRS Temperature  0  SIRS Pulse 1  SIRS Respirations  1  SIRS WBC 0  SIRS Score Sum  2

## 2022-12-28 NOTE — Significant Event (Signed)
Rapid Response Event Note   Reason for Call :  Low BP, increased work of breathing  Initial Focused Assessment:  Patient in bed, tachypnic, SpO2 91-92% on 4-5 L [place]. Patient states he is having increased pain in abdomen and chest that is described as sharp, worsening in inspiration. EKG completed. Hypotensive SBP mid 80s, improved to 90s.   Alert and oriented x4, follows commands.  Lungs diminished bilaterally, patient states his abdomen is making it hard for him to breathe, on PCA has only used x2 since 8 AM per pump.   Transported to CT, scan completed and taken to 1236 by bedside staff and RRRN.   Code sepsis order set ordered on arrival to SDU.   Interventions:  Transfer to SD, CT scan completed.    Event Summary:   MD Notified: Kinsinger MD Call Time: [phone] Arrival Time: 0347 End Time: 1109  Rosaria Ferries, RN

## 2022-12-28 NOTE — Consult Note (Signed)
[facility] Initial Consultation Note  [name] DGU:[id number] DOB: 10/09/1985 DOA: 12/21/2022  PCP: Toma Deiters, MD   Requesting Physician: Feliciana Rossetti, MD   Reason for Consultation: Hypoxia  HPI: Steven DERUYTER is a 37 y.o. male with medical history significant for morbid obesity, severe erosive esophagitis and biliary colic who was admitted to the hospital 9/5 for elective laparoscopic cholecystectomy by Dr. Sheliah Hatch who performed partial cholecystectomy and biliary drain placement on that date, with postoperative course complicated by bile leak and development of sepsis physiology.  [facility] were contacted for assistance.  On my interview of the patient, he complains of worsening left-sided abdominal pain, which is new in the last 24 hours prior to which he was only having right-sided abdominal pain.  He denies any nausea, vomiting, cough or feeling shortness of breath.  His main complaint is that he wants something to drink.  He interrupts my questions multiple times to tell me that he wants to drink, he also interrupts me multiple times when I am trying to tell him the plan of care, or discuss goals of care with him.  On further review of his chart, seems that he started to develop a low-grade fever yesterday, and has been tachycardic since that time as well.  Blood pressure had been elevated, but this morning at about 10 AM he started having some hypotension with blood pressure as low as 82/60.  While yesterday he was on 2 L nasal cannula oxygen, this afternoon his oxygen has been increased to 5 L.  He was documented as saturating 90% on 3 L.  Just prior to [facility] consultation, he was started on 1 L fluid bolus, and given a dose of empiric IV Zosyn.  I reviewed recent lab work from today, he has worsening leukocytosis 22,000, hemoglobin and platelets are stable.  He has also had a large bump in his creatinine, from 0.7-1.6 to.  Electrolytes are unremarkable, as are  his LFTs though he has developed some hyperbilirubinemia with total bilirubin 1.8 this morning, was normal yesterday.  He has a lactic acidosis with lactate 3.2, 3.3 on repeat.  Review of Systems: Please see HPI for pertinent positives and negatives. A complete 10 system review of systems are otherwise negative.  Past Medical History:  Diagnosis Date   Acute blood loss anemia 09/02/2012   S/p 1 unit rbcs   Cellulitis    Erosive esophagitis 09/01/2012   NSAID induced.   Gall stones    Gastric ulcer with hemorrhage 09/02/2012   s/p bleeding control tx. Per Dr. Jena Gauss   Gout    Obesity    UTI (lower urinary tract infection)    Past Surgical History:  Procedure Laterality Date   CHOLECYSTECTOMY N/A 12/21/2022   Procedure: LAPAROSCOPIC CHOLECYSTECTOMY;  Surgeon: Kinsinger, De Blanch, MD;  Location: [facility];  Service: General;  Laterality: N/A;   COLONOSCOPY WITH PROPOFOL N/A 07/30/2014   RMR: Colonoscopy anal canal/internal hemorrhoids, otherwise normal ileocolonoscoopy.    ESOPHAGOGASTRODUODENOSCOPY Left 09/01/2012   ZDG:LOVFIE esophageal erosions consistent with erosive reflux esophagitis/Pre-pyloric benign-appearing gastric ulcer with bleeding stigmata status post bleeding control therapy as described above   ESOPHAGOGASTRODUODENOSCOPY N/A 12/13/2012   PPI:RJJOAC hernia. PReviously noted gastric ulcer completely healed.    ESOPHAGOGASTRODUODENOSCOPY (EGD) WITH PROPOFOL N/A 07/30/2014   Procedure: ESOPHAGOGASTRODUODENOSCOPY (EGD) WITH PROPOFOL;  Surgeon: Corbin Ade, MD;  Location: [facility];  Service: Endoscopy;  Laterality: N/A;   TONSILLECTOMY      Social History:  reports  that he has been smoking cigarettes. He has a 7 pack-year smoking history. His smokeless tobacco use includes snuff. He reports current drug use. Drug: Marijuana. He reports that he does not drink alcohol.  Allergies  Allergen Reactions   Asa [Aspirin] Other (See Comments)    Pt doesn't know reaction.    Clindamycin/Lincomycin     Gastric ulcer     Nsaids Other (See Comments)    Causes Ulcers    Penicillins Other (See Comments)    Pt doesn't know reaction.     Sulfa Antibiotics Other (See Comments)    Pt doesn't know reaction.    Family History  Problem Relation Age of Onset   Heart failure Mother    Colon cancer Neg Hx      Prior to Admission medications   Medication Sig Start Date End Date Taking? Authorizing Provider  acetaminophen (TYLENOL) 325 MG tablet Take 2 tablets (650 mg total) by mouth every 6 (six) hours as needed for mild pain. 07/01/22  Yes Tyrone Nine, MD  allopurinol (ZYLOPRIM) 100 MG tablet Take 1 tablet (100 mg total) by mouth daily. 07/02/22  Yes Tyrone Nine, MD  gabapentin (NEURONTIN) 300 MG capsule Take 1 capsule (300 mg total) by mouth 4 (four) times daily. 07/01/22  Yes Tyrone Nine, MD  hydrocortisone (ANUSOL-HC) 2.5 % rectal cream Place 1 Application rectally 2 (two) times daily.   Yes [provider]  Lidocaine-Menthol 4-5 % PTCH Apply 1 patch topically daily. Remove after 12 hours   Yes [provider]  ondansetron (ZOFRAN-ODT) 4 MG disintegrating tablet Take 1 tablet (4 mg total) by mouth every 8 (eight) hours as needed for nausea. 08/12/22  Yes Eber Hong, MD  pantoprazole (PROTONIX) 40 MG tablet Take 1 tablet (40 mg total) by mouth 2 (two) times daily before a meal. 07/01/22  Yes Tyrone Nine, MD  traMADol (ULTRAM) 50 MG tablet Take 50 mg by mouth 3 (three) times daily as needed for moderate pain.   Yes [provider]    Physical Exam: BP (!) 100/34   Pulse (!) 149   Temp 100.1 F (37.8 C) (Oral)   Resp (!) 28   Ht 5\' 7"  (1.702 m)   Wt (!) 235 kg   SpO2 93%   BMI 81.14 kg/m   General: Incredibly obese young man wearing 5 L nasal cannula oxygen who is tachypneic, and looks toxic.  He is awake, alert, and oriented x 4.  He is speaking in full sentences, no cough. Cardiovascular: Tachycardic and regular, he does  have some nonpitting bilateral lower extremity edema Respiratory: Very limited examination due to body habitus.  Breath sounds are globally diminished, with no obvious wheezing or rhonchi, he is tachypneic.  No retractions.   Abdomen: Obese, soft, appears to be diffusely tender but only mildly so.  Nondistended.  He has right upper quadrant drain, with bilious material. Skin: dry, no rashes  Musculoskeletal: no joint effusions, normal range of motion  Psychiatric: appropriate affect, normal speech  Neurologic: extraocular muscles intact, clear speech, moving all extremities with intact sensorium         Recent Labs and Imaging Reviewed:  Basic Metabolic Panel: Recent Labs  Lab 12/22/22 0455 12/23/22 0440 12/27/22 0946 12/28/22 0457 12/28/22 1147  NA 137 137 135  --  135  K 3.9 3.7 3.9  --  3.9  CL 97* 97* 97*  --  97*  CO2 29 28 25   --  24  GLUCOSE 171* 131* 209*  --  135*  BUN 14 23* 12  --  17  CREATININE 0.65 0.81 0.60* 0.67 1.62*  CALCIUM 8.7* 8.7* 8.7*  --  8.8*   Liver Function Tests: Recent Labs  Lab 12/22/22 0455 12/23/22 0440 12/27/22 0946 12/28/22 1147  AST 30 18 23 22   ALT 47* 37 35 37  ALKPHOS 64 58 88 104  BILITOT 1.0 0.9 0.8 1.8*  PROT 7.3 6.8 7.7 7.1  ALBUMIN 3.3* 3.2* 3.3* 3.0*   No results for input(s): "LIPASE", "AMYLASE" in the last 168 hours. No results for input(s): "AMMONIA" in the last 168 hours. CBC: Recent Labs  Lab 12/22/22 0455 12/23/22 0440 12/27/22 0946 12/28/22 1147  WBC 23.3* 14.0* 17.5* 21.9*  NEUTROABS  --   --   --  20.5*  HGB 14.5 13.0 13.4 13.5  HCT 45.5 42.5 42.5 44.1  MCV 88.7 92.4 90.8 93.0  PLT 317 259 347 324   Cardiac Enzymes: No results for input(s): "CKTOTAL", "CKMB", "CKMBINDEX", "TROPONINI" in the last 168 hours.  BNP (last 3 results) No results for input(s): "BNP" in the last 8760 hours.  ProBNP (last 3 results) No results for input(s): "PROBNP" in the last 8760 hours.  CBG: Recent Labs  Lab  12/28/22 1129  GLUCAP 126*    Radiological Exams on Admission: DG Chest Port 1 View  Result Date: 12/28/2022 CLINICAL DATA:  Sepsis. EXAM: PORTABLE CHEST 1 VIEW COMPARISON:  December 22, 2022. FINDINGS: Stable cardiomegaly with central pulmonary vascular congestion. Minimal bibasilar subsegmental atelectasis is noted. Bony thorax is unremarkable. IMPRESSION: Stable cardiomegaly with central pulmonary vascular congestion. Minimal bibasilar subsegmental atelectasis. Electronically Signed   By: Lupita Raider M.D.   On: 12/28/2022 13:42    Summary and Recommendations: [name] is a 37 y.o. male with medical history significant for morbid obesity, severe erosive esophagitis and biliary colic who was admitted to the hospital 9/5 for elective laparoscopic cholecystectomy by Dr. Sheliah Hatch who performed partial cholecystectomy and biliary drain placement on that date, with postoperative course complicated by bile leak and development of sepsis physiology.  Sepsis-meeting criteria with tachycardia, tachypnea, hypotension, worsening leukocytosis, and acute renal failure.  Patient appears toxic.  He appears dehydrated on examination.  Initial lactate 3.2.  He needs broad-spectrum antibiotics and aggressive fluid resuscitation, though I am concerned how this will affect his respiratory status.  He also has cardiomegaly which is likely indicative of heart failure. -Continue care on stepdown unit -Patient is quite adamant after being asked by nursing staff, as well as myself more than once, that he does not want BiPAP or intubation even if it means he may die without it. -Will bolus a total 2 L IV fluid, with 100 cc/h normal saline thereafter -Will give further boluses pending response of blood pressure, tachycardia, and respiratory status -Patient is okay with all medical management, so may benefit from short course of peripheral vasoactive medication if indicated -Empiric IV vancomycin, IV cefepime,  IV Flagyl -Follow-up blood cultures obtained this afternoon -Follow-up urinalysis and culture if indicated -Repeat lactate and trend, after total 2 L fluid bolus  Acute hypoxic respiratory failure-I am concerned that this is related to heart failure and pulmonary edema.  No consolidation seen on CT today.  No PE. -Continue supplemental oxygen -Albuterol neb as needed  Acute kidney injury-I think this is ATN from hypotension, as well as sepsis and dehydration. -Avoid nephrotoxins, renally dose medications -Fluid resuscitate as above -Follow renal function with daily  labs  Postoperative biliary leak-has been seen by gastroenterology Dr. Elnoria Howard who is planning ERCP and attempted stent placement in the morning.  This may need to be delayed pending patient's clinical picture.  Peripheral neuropathy-gabapentin  Type 2 diabetes-currently n.p.o., maintained with sliding scale insulin  Hemorrhoids-Anusol twice daily  Morbid obesity-last BMI 81 complicating all aspects of care  Thank you for involving [place] in the care of your patient.  [facility] will continue to follow along with you.    Code Status: Limited: Do not attempt resuscitation (DNR) -DNR-LIMITED -Do Not Intubate/DNI  -As stated above plan of care was discussed in detail with the patient -I am concerned about his respiratory status especially with fluid resuscitation, but patient is adamant and consistent when questioned at multiple points of my interview that he refuses BiPAP, he refuses intubation, refuses shocks or CPR even if it means that he may die  Time spent: 90 minutes  Jakara Blatter Sharlette Dense MD [facility] Pager [phone]  If 7PM-7AM, please contact night-coverage [email or web address] Password [facility]  12/28/2022, 2:47 PM

## 2022-12-28 NOTE — Progress Notes (Addendum)
Pharmacy Antibiotic Note  [name] is a 37 y.o. male with chronic calculous cholecystitis who presented to [facility] on 12/21/2022 for laparoscopic partial cholecystectomy.  Pharmacy has been consulted on 12/28/22 to dose vancomycin, cefepime and flagyl for sepsis.  Today, 12/28/2022: - scr up 1.62 (crcl~63N) - wbc 21.9  Plan: - cefepime 2gm IV q8h - vancomycin 2000 mg IV x1, then 1250 mg IV q12h for est AUC 403 - flagyl 500mg  IV q12h - f/u renal function closely   ______________________________________  [name] (9/24p) : scr at 8:39p is 2.79 (crcl~40 N) - will d/c standing vanc due renal function. F/u with renal function on 9/13 and redose or check level if needed - change cefepime to 2gm q12h  ___________________________________  Height: 5\' 7"  (170.2 cm) Weight: (!) 235 kg (518 lb 1.3 oz) IBW/kg (Calculated) : 66.1  Temp (24hrs), Avg:99.2 F (37.3 C), Min:98.3 F (36.8 C), Max:100.1 F (37.8 C)  Recent Labs  Lab 12/22/22 0455 12/23/22 0440 12/27/22 0946 12/28/22 0457 12/28/22 1147 12/28/22 1312  WBC 23.3* 14.0* 17.5*  --  21.9*  --   CREATININE 0.65 0.81 0.60* 0.67 1.62*  --   LATICACIDVEN  --   --   --   --  3.2* 3.3*    Estimated Creatinine Clearance: 118.1 mL/min (A) (by C-G formula based on SCr of 1.62 mg/dL (H)).    Allergies  Allergen Reactions   Asa [Aspirin] Other (See Comments)    Pt doesn't know reaction.   Clindamycin/Lincomycin     Gastric ulcer     Nsaids Other (See Comments)    Causes Ulcers    Penicillins Other (See Comments)    Pt doesn't know reaction.     Sulfa Antibiotics Other (See Comments)    Pt doesn't know reaction.     Thank you for allowing pharmacy to be a part of this patient's care.  [name] 12/28/2022 3:01 PM

## 2022-12-28 NOTE — Progress Notes (Signed)
eLink Physician-Brief Progress Note Patient Name: [name] DOB: 18-Jun-1985 MRN: [id number]   Date of Service  12/28/2022  HPI/Events of Note  Patient with hypotension despite adequate fluid resuscitation and maximal dose peripheral Levo gtt. Peripheral Neo gtt added, stat CBC, Type and Screen, CMP, lactic acid.  Ground crew en-route to see patient and possibly place a central line. Arterial line previously ordered.  eICU Interventions  See above. New Patient Evaluation.        Thomasene Lot Wesly Whisenant 12/28/2022, 9:09 PM

## 2022-12-28 NOTE — Progress Notes (Addendum)
Patient Name: TAELYN AMAN           DOB: 1985/12/13  MRN: [id number]      Admission Date: 12/21/2022  Attending Provider: Sheliah Hatch De Blanch, MD  Primary Diagnosis: Chronic calculous cholecystitis   Level of care: Stepdown    CROSS COVER NOTE   Date of Service   12/28/2022   [name], 37 y.o. male, was admitted on 12/21/2022 for Chronic calculous cholecystitis.  Admitted on 9/5 for elective partial cholecystectomy and biliary drainage placement. Postop course was complicated by bile leak and then development of sepsis (9/12).   HPI/Events of Note   Septic shock SBP 70/40, MAP 40-50s.  Seen by hospitalist service today for tachycardia, hypotension, low-grade fever, hypoxia.  Leukocytosis (WBC 22), elevated lactic acid (3.2, 3.3 --> now 2.7). BC, chest x-ray, UA collected.  Started on IV vancomycin, cefepime, Flagyl. Persistent hypotension despite receiving 4 L fluid bolus, 10 mg midodrine. Will start patient on Levophed.  Patient may benefit from arterial line placement. Spoke with PCCM (Dr. Marchelle Gearing) regarding patient's case.   At bedside patient is A/O x 4.  He is able to communicate his needs well. -Noted to be tachycardic (120-130s), hypotensive (80/30s), and tachypneic (20s) on 4 L nasal cannula. -Denies lightheadedness, chest pain, palpitations, N/V.  Endorses SOB and abdominal pain.  Resp: some accessory muscle use, diminished breath sounds.  No cough Cardiac: S1 and S2 heard.  Nonpitting BLE edema.  Skin cool. Abdomen: Obese, soft, RUQ drain present.  Mild tenderness, mostly around drain.     CODE STATUS CHANGE-  Patient was informed regarding need for vasopressors for BP management.  CODE STATUS was verified with patient.  At this time the patient is electing to be full code.  He understands this is very different from prior DNR/DNI status.  He also understands the aggressive measures that can occur in case of cardiac or respiratory arrest (CPR,  shock, intubation, BiPAP, ACLS meds, etc). Code status has been changed to FULL CODE. This conversation was witnessed by bedside RN Vincenza Hews.   Interventions/ Plan   EKG- pending Chest x-ray- Progressive perihilar pulmonary edema.  Blood gas- pending Levophed- PCCM aware        Anthoney Harada, DNP, ACNPC- AG [facility] [place]

## 2022-12-29 ENCOUNTER — Encounter (HOSPITAL_COMMUNITY)
Admission: RE | Disposition: A | Discharge status: Skilled Nursing Facility | Payer: Self-pay | Source: Skilled Nursing Facility | Attending: General Surgery

## 2022-12-29 ENCOUNTER — Encounter (HOSPITAL_COMMUNITY): Payer: Self-pay | Admitting: Anesthesiology

## 2022-12-29 ENCOUNTER — Inpatient Hospital Stay (HOSPITAL_COMMUNITY): Payer: Medicaid Other

## 2022-12-29 DIAGNOSIS — K8010 Calculus of gallbladder with chronic cholecystitis without obstruction: Secondary | ICD-10-CM | POA: Diagnosis not present

## 2022-12-29 DIAGNOSIS — R6521 Severe sepsis with septic shock: Secondary | ICD-10-CM

## 2022-12-29 DIAGNOSIS — A419 Sepsis, unspecified organism: Secondary | ICD-10-CM | POA: Diagnosis not present

## 2022-12-29 LAB — COMPREHENSIVE METABOLIC PANEL
ALT: 38 U/L (ref 0–44)
AST: 34 U/L (ref 15–41)
Albumin: 2.5 g/dL — ABNORMAL LOW (ref 3.5–5.0)
Alkaline Phosphatase: 83 U/L (ref 38–126)
Anion gap: 15 (ref 5–15)
BUN: 28 mg/dL — ABNORMAL HIGH (ref 6–20)
CO2: 21 mmol/L — ABNORMAL LOW (ref 22–32)
Calcium: 7.7 mg/dL — ABNORMAL LOW (ref 8.9–10.3)
Chloride: 97 mmol/L — ABNORMAL LOW (ref 98–111)
Creatinine, Ser: 3.08 mg/dL — ABNORMAL HIGH (ref 0.61–1.24)
GFR, Estimated: 26 mL/min — ABNORMAL LOW (ref >60)
Glucose, Bld: 138 mg/dL — ABNORMAL HIGH (ref 70–99)
Potassium: 4.7 mmol/L (ref 3.5–5.1)
Sodium: 133 mmol/L — ABNORMAL LOW (ref 135–145)
Total Bilirubin: 1.7 mg/dL — ABNORMAL HIGH (ref 0.3–1.2)
Total Protein: 6.5 g/dL (ref 6.5–8.1)

## 2022-12-29 LAB — POCT I-STAT 7, (LYTES, BLD GAS, ICA,H+H)
Acid-base deficit: 6 mmol/L — ABNORMAL HIGH (ref 0.0–2.0)
Acid-base deficit: 6 mmol/L — ABNORMAL HIGH (ref 0.0–2.0)
Bicarbonate: 21.5 mmol/L (ref 20.0–28.0)
Bicarbonate: 22 mmol/L (ref 20.0–28.0)
Calcium, Ion: 1.01 mmol/L — ABNORMAL LOW (ref 1.15–1.40)
Calcium, Ion: 1.03 mmol/L — ABNORMAL LOW (ref 1.15–1.40)
HCT: 37 % — ABNORMAL LOW (ref 39.0–52.0)
HCT: 38 % — ABNORMAL LOW (ref 39.0–52.0)
Hemoglobin: 12.6 g/dL — ABNORMAL LOW (ref 13.0–17.0)
Hemoglobin: 12.9 g/dL — ABNORMAL LOW (ref 13.0–17.0)
O2 Saturation: 91 %
O2 Saturation: 98 %
Patient temperature: 98
Patient temperature: 98
Potassium: 4.2 mmol/L (ref 3.5–5.1)
Potassium: 4.6 mmol/L (ref 3.5–5.1)
Sodium: 134 mmol/L — ABNORMAL LOW (ref 135–145)
Sodium: 134 mmol/L — ABNORMAL LOW (ref 135–145)
TCO2: 23 mmol/L (ref 22–32)
TCO2: 24 mmol/L (ref 22–32)
pCO2 arterial: 50.8 mmHg — ABNORMAL HIGH (ref 32–48)
pCO2 arterial: 52.1 mmHg — ABNORMAL HIGH (ref 32–48)
pH, Arterial: 7.233 — ABNORMAL LOW (ref 7.35–7.45)
pH, Arterial: 7.233 — ABNORMAL LOW (ref 7.35–7.45)
pO2, Arterial: 122 mmHg — ABNORMAL HIGH (ref 83–108)
pO2, Arterial: 72 mmHg — ABNORMAL LOW (ref 83–108)

## 2022-12-29 LAB — GLUCOSE, CAPILLARY
Glucose-Capillary: 170 mg/dL — ABNORMAL HIGH (ref 70–99)
Glucose-Capillary: 178 mg/dL — ABNORMAL HIGH (ref 70–99)
Glucose-Capillary: 153 mg/dL — ABNORMAL HIGH (ref 70–99)
Glucose-Capillary: 128 mg/dL — ABNORMAL HIGH (ref 70–99)
Glucose-Capillary: 115 mg/dL — ABNORMAL HIGH (ref 70–99)
Glucose-Capillary: 105 mg/dL — ABNORMAL HIGH (ref 70–99)

## 2022-12-29 LAB — COOXEMETRY PANEL
Carboxyhemoglobin: 2.1 % — ABNORMAL HIGH (ref 0.5–1.5)
Methemoglobin: <0.7 % (ref 0.0–1.5)
O2 Saturation: 83.4 %
Total hemoglobin: 12.5 g/dL (ref 12.0–16.0)

## 2022-12-29 LAB — BLOOD CULTURE ID PANEL (REFLEXED) - BCID2

## 2022-12-29 LAB — CBC
HCT: 39.9 % (ref 39.0–52.0)
Hemoglobin: 12 g/dL — ABNORMAL LOW (ref 13.0–17.0)
MCH: 28.7 pg (ref 26.0–34.0)
MCHC: 30.1 g/dL (ref 30.0–36.0)
MCV: 95.5 fL (ref 80.0–100.0)
Platelets: 348 10*3/uL (ref 150–400)
RBC: 4.18 MIL/uL — ABNORMAL LOW (ref 4.22–5.81)
RDW: 13.8 % (ref 11.5–15.5)
WBC: 41.1 10*3/uL — ABNORMAL HIGH (ref 4.0–10.5)
nRBC: 0.1 % (ref 0.0–0.2)

## 2022-12-29 LAB — CORTISOL: Cortisol, Plasma: >100 ug/dL

## 2022-12-29 LAB — HEMOGLOBIN A1C
Hgb A1c MFr Bld: 5.6 % (ref 4.8–5.6)
Mean Plasma Glucose: 114 mg/dL

## 2022-12-29 LAB — MAGNESIUM: Magnesium: 1.5 mg/dL — ABNORMAL LOW (ref 1.7–2.4)

## 2022-12-29 LAB — PHOSPHORUS: Phosphorus: 4.7 mg/dL — ABNORMAL HIGH (ref 2.5–4.6)

## 2022-12-29 LAB — LACTIC ACID, PLASMA
Lactic Acid, Venous: 2.4 mmol/L (ref 0.5–1.9)
Lactic Acid, Venous: 2 mmol/L (ref 0.5–1.9)

## 2022-12-29 LAB — LIPASE, BLOOD: Lipase: 15 U/L (ref 11–51)

## 2022-12-29 LAB — PROCALCITONIN: Procalcitonin: >150 ng/mL

## 2022-12-29 SURGERY — ENDOSCOPIC RETROGRADE CHOLANGIOPANCREATOGRAPHY (ERCP)
Anesthesia: General

## 2022-12-29 MED ORDER — HEPARIN SODIUM (PORCINE) 5000 UNIT/ML IJ SOLN
5000.0000 [IU] | Freq: Three times a day (TID) | INTRAMUSCULAR | Status: DC
  Administered 2022-12-29 – 2023-01-07 (×28): 5000 [IU] via SUBCUTANEOUS
  Filled 2022-12-29 (×26): qty 1

## 2022-12-29 MED ORDER — KETAMINE HCL 50 MG/5ML IJ SOSY
PREFILLED_SYRINGE | INTRAMUSCULAR | Status: AC
  Filled 2022-12-29: qty 5

## 2022-12-29 MED ORDER — PHENYLEPHRINE HCL-NACL 20-0.9 MG/250ML-% IV SOLN
0.0000 ug/min | INTRAVENOUS | Status: DC
  Administered 2022-12-29: 80 ug/min via INTRAVENOUS
  Administered 2022-12-29 (×2): 180 ug/min via INTRAVENOUS
  Filled 2022-12-29 (×3): qty 250

## 2022-12-29 MED ORDER — PHENYLEPHRINE HCL-NACL 20-0.9 MG/250ML-% IV SOLN
0.0000 ug/min | INTRAVENOUS | Status: DC
  Administered 2022-12-29: 30 ug/min via INTRAVENOUS

## 2022-12-29 MED ORDER — LINEZOLID 600 MG/300ML IV SOLN
600.0000 mg | Freq: Two times a day (BID) | INTRAVENOUS | Status: DC
  Administered 2022-12-29 – 2022-12-30 (×4): 600 mg via INTRAVENOUS
  Filled 2022-12-29 (×5): qty 300

## 2022-12-29 MED ORDER — ACETAMINOPHEN 10 MG/ML IV SOLN
1000.0000 mg | Freq: Three times a day (TID) | INTRAVENOUS | Status: AC | PRN
  Administered 2022-12-29: 1000 mg via INTRAVENOUS
  Filled 2022-12-29: qty 100

## 2022-12-29 MED ORDER — OXYCODONE HCL 5 MG PO TABS
5.0000 mg | ORAL_TABLET | Freq: Four times a day (QID) | ORAL | Status: DC | PRN
  Administered 2023-01-02: 5 mg via ORAL
  Administered 2023-01-02: 10 mg via ORAL
  Administered 2023-01-02 – 2023-01-03 (×2): 5 mg via ORAL
  Administered 2023-01-03 – 2023-01-04 (×3): 10 mg via ORAL
  Administered 2023-01-04: 5 mg via ORAL
  Administered 2023-01-06 – 2023-01-08 (×6): 10 mg via ORAL
  Filled 2022-12-29 (×5): qty 2
  Filled 2022-12-29: qty 1
  Filled 2022-12-29 (×3): qty 2
  Filled 2022-12-29 (×3): qty 1
  Filled 2022-12-29 (×2): qty 2

## 2022-12-29 MED ORDER — FENTANYL CITRATE PF 50 MCG/ML IJ SOSY
PREFILLED_SYRINGE | INTRAMUSCULAR | Status: AC
  Filled 2022-12-29: qty 2

## 2022-12-29 MED ORDER — MIDAZOLAM HCL 2 MG/2ML IJ SOLN
INTRAMUSCULAR | Status: AC
  Filled 2022-12-29: qty 2

## 2022-12-29 MED ORDER — ETOMIDATE 2 MG/ML IV SOLN
INTRAVENOUS | Status: AC
  Filled 2022-12-29: qty 20

## 2022-12-29 MED ORDER — CALCIUM GLUCONATE-NACL 1-0.675 GM/50ML-% IV SOLN
1.0000 g | Freq: Once | INTRAVENOUS | Status: AC
  Administered 2022-12-29: 1000 mg via INTRAVENOUS
  Filled 2022-12-29: qty 50

## 2022-12-29 MED ORDER — GABAPENTIN 100 MG PO CAPS
100.0000 mg | ORAL_CAPSULE | Freq: Four times a day (QID) | ORAL | Status: DC
  Administered 2022-12-31 – 2023-01-03 (×13): 100 mg via ORAL
  Filled 2022-12-29 (×14): qty 1

## 2022-12-29 MED ORDER — SUCCINYLCHOLINE CHLORIDE 200 MG/10ML IV SOSY
PREFILLED_SYRINGE | INTRAVENOUS | Status: AC
  Filled 2022-12-29: qty 10

## 2022-12-29 MED ORDER — ROCURONIUM BROMIDE 10 MG/ML (PF) SYRINGE
PREFILLED_SYRINGE | INTRAVENOUS | Status: AC
  Filled 2022-12-29: qty 10

## 2022-12-29 MED ORDER — PHENYLEPHRINE 80 MCG/ML (10ML) SYRINGE FOR IV PUSH (FOR BLOOD PRESSURE SUPPORT)
PREFILLED_SYRINGE | INTRAVENOUS | Status: AC
  Filled 2022-12-29: qty 10

## 2022-12-29 MED ORDER — VASOPRESSIN 20 UNITS/100 ML INFUSION FOR SHOCK
INTRAVENOUS | Status: AC
  Administered 2022-12-29: 0.03 [IU]/min via INTRAVENOUS
  Filled 2022-12-29: qty 100

## 2022-12-29 MED ORDER — HYDROCORTISONE SOD SUC (PF) 100 MG IJ SOLR
100.0000 mg | Freq: Three times a day (TID) | INTRAMUSCULAR | Status: DC
  Administered 2022-12-29 – 2022-12-31 (×8): 100 mg via INTRAVENOUS
  Filled 2022-12-29 (×7): qty 2

## 2022-12-29 MED ORDER — GERHARDT'S BUTT CREAM
TOPICAL_CREAM | Freq: Two times a day (BID) | CUTANEOUS | Status: DC
  Administered 2022-12-30: 1 via TOPICAL
  Filled 2022-12-29 (×2): qty 1

## 2022-12-29 MED ORDER — MAGNESIUM SULFATE 2 GM/50ML IV SOLN
2.0000 g | Freq: Once | INTRAVENOUS | Status: AC
  Administered 2022-12-29: 2 g via INTRAVENOUS
  Filled 2022-12-29: qty 50

## 2022-12-29 MED ORDER — SODIUM CHLORIDE 0.9 % IV SOLN
2.0000 g | Freq: Two times a day (BID) | INTRAVENOUS | Status: DC
  Administered 2022-12-29 – 2023-01-01 (×7): 2 g via INTRAVENOUS
  Filled 2022-12-29 (×7): qty 12.5

## 2022-12-29 MED ORDER — CALCIUM GLUCONATE-NACL 2-0.675 GM/100ML-% IV SOLN
2.0000 g | Freq: Once | INTRAVENOUS | Status: AC
  Administered 2022-12-29: 2000 mg via INTRAVENOUS
  Filled 2022-12-29: qty 100

## 2022-12-29 MED ORDER — NOREPINEPHRINE 16 MG/250ML-% IV SOLN
0.0000 ug/min | INTRAVENOUS | Status: AC
  Administered 2022-12-29: 18 ug/min via INTRAVENOUS
  Administered 2022-12-29: 38 ug/min via INTRAVENOUS
  Administered 2022-12-30: 16 ug/min via INTRAVENOUS
  Administered 2022-12-30: 15 ug/min via INTRAVENOUS
  Administered 2022-12-31: 8 ug/min via INTRAVENOUS
  Filled 2022-12-29 (×4): qty 250

## 2022-12-29 MED ORDER — VASOPRESSIN 20 UNITS/100 ML INFUSION FOR SHOCK
0.0000 [IU]/min | INTRAVENOUS | Status: DC
  Administered 2022-12-29 – 2022-12-31 (×5): 0.03 [IU]/min via INTRAVENOUS
  Filled 2022-12-29 (×7): qty 100

## 2022-12-29 MED ORDER — NOREPINEPHRINE 4 MG/250ML-% IV SOLN
0.0000 ug/min | INTRAVENOUS | Status: DC
  Administered 2022-12-29: 40 ug/min via INTRAVENOUS
  Administered 2022-12-29 (×3): 50 ug/min via INTRAVENOUS
  Filled 2022-12-29 (×4): qty 250

## 2022-12-29 NOTE — Progress Notes (Signed)
Patient ripped bipap off stating he can't breathe with it on. Refused to be placed back on at this time. [name] applied at 4L

## 2022-12-29 NOTE — Progress Notes (Signed)
PHARMACY NOTE -  Cefepime  Pharmacy has been assisting with dosing of cefepime for intra-abdominal infection vs PNA. Dosage remains stable at 2g IV q8 hr and further renal adjustments per institutional Pharmacy antibiotic protocol Vancomycin transitioned to Linezolid given worsening AKI  Pharmacy will sign off, following peripherally for culture results, dose adjustments, and length of therapy. Please reconsult if a change in clinical status warrants re-evaluation of dosage.  Bernadene Person, PharmD, BCPS [phone] 12/29/2022, 11:25 AM

## 2022-12-29 NOTE — Progress Notes (Signed)
   Bedside  review with Anesthesia MD Dr Freida Busman  Prior intubation was glidescope 4 and Grade 1 view Currently improved color Pressors going via CVL MAP adequate at th emoment Color better Tolreating BiPAP Not paradoxical 35% fio2  Lactate better  Recent Labs  Lab 12/28/22 2045 12/28/22 2104 12/29/22 0014 12/29/22 0129  PHART 7.21*  --  7.233* 7.233*  PCO2ART 54*  --  50.8* 52.1*  PO2ART 75*  --  72* 122*  HCO3 21.6 23.3 21.5 22.0  TCO2  --   --  23 24  O2SAT 97.1 73 91 98   Recent Labs  Lab 12/28/22 1636 12/28/22 2039 12/29/22 0107  LATICACIDVEN 2.9* 2.7* 2.4*     Plan  - hold off intubation  - continue to mntor closely   Additional 30 mn critical care  SIGNATURE    Dr. Kalman Shan, M.D., F.C.C.P,  Pulmonary and Critical Care Medicine Staff Physician, [facility] Director - Interstitial Lung Disease  Program  Pulmonary Fibrosis [facility] at [facility] [place], [place], [address]   Pager: [phone], If no answer  -> Check AMION or Try [phone] Telephone (clinical office): [phone] Telephone (research): [phone]  2:02 AM 12/29/2022

## 2022-12-29 NOTE — Plan of Care (Signed)
Problem: Education: Goal: Knowledge of General Education information will improve Description: Including pain rating scale, medication(s)/side effects and non-pharmacologic comfort measures Outcome: Not Progressing   Problem: Health Behavior/Discharge Planning: Goal: Ability to manage health-related needs will improve Outcome: Not Progressing

## 2022-12-29 NOTE — Anesthesia Preprocedure Evaluation (Signed)
Anesthesia Evaluation  Patient identified by MRN, date of birth, ID band Patient awake    Reviewed: Allergy & Precautions, NPO status , Patient's Chart, lab work & pertinent test results  Airway Mallampati: III       Dental  (+) Dental Advisory Given, Chipped, Missing, Poor Dentition   Pulmonary Current Smoker   Pulmonary exam normal breath sounds clear to auscultation       Cardiovascular Normal cardiovascular exam Rhythm:Regular Rate:Normal  Septic shock requiring vasopressors   Neuro/Psych Peripheral neuropathy  Neuromuscular disease  negative psych ROS   GI/Hepatic Neg liver ROS, hiatal hernia, PUD,GERD  Medicated,,Concern for active biliary leak   Endo/Other    Morbid obesityGout  Renal/GU negative Renal ROS  negative genitourinary   Musculoskeletal  (+) Arthritis , Osteoarthritis,    Abdominal  (+) + obese  Peds  Hematology  (+) Blood dyscrasia, anemia   Anesthesia Other Findings   Reproductive/Obstetrics                              Anesthesia Physical Anesthesia Plan  ASA: 4  Anesthesia Plan: General   Post-op Pain Management:    Induction: Intravenous and Cricoid pressure planned  PONV Risk Score and Plan: 3 and Treatment may vary due to age or medical condition, Midazolam, Ondansetron and Dexamethasone  Airway Management Planned: Oral ETT  Additional Equipment: None  Intra-op Plan:   Post-operative Plan: Extubation in OR  Informed Consent: I have reviewed the patients History and Physical, chart, labs and discussed the procedure including the risks, benefits and alternatives for the proposed anesthesia with the patient or authorized representative who has indicated his/her understanding and acceptance.     Dental advisory given  Plan Discussed with: CRNA and Anesthesiologist  Anesthesia Plan Comments: (Signs of septic shock following lap chole. Concern for  biliary leak. Plan for ercp today. Overnight further decompensation, requiring central line, a line, norepi, and bipap for respiratory decompensation.    "Low oxygen saturations noted patient refusing CPAP made.  Made NPO. Left upper abdominal pain : Worsened white 9 to 14-17,000.  No temperature spikes.  ERCP planned for 12/29/2022 with stent placement.  Concern for persistent bile leak.  Later in the day febrile with rising white count to 32,000 and onset of hypotension./Shock.  Requiring Levophed and Neo-Synephrine.  2 peripheral IVs.  Onset of hypoxemia and worsening requiring 5 L.  Initially hospitalist medicine consulted started on vancomycin, cefepime and Flagyl.  Due to persistent shock critical care medicine consulted."   )        Anesthesia Quick Evaluation

## 2022-12-29 NOTE — Progress Notes (Signed)
NAME:  [name], MRN:  [id number], DOB:  Nov 19, 1985, LOS: 7 ADMISSION DATE:  12/21/2022 CONSULTATION DATE: 12/28/2022 REFERRING MD:  Kinsinger - [facility] CHIEF COMPLAINT:  Septic shock, cholelithiasis  History of Present Illness:  37 year old man who presented to [facility] 9/5 for elective laparoscopic cholecystectomy. PMHx significant for morbid obesity, severe erosive esophagitis c/b hemorrhage (suspected to be NSAID-induced), cholelithiasis with biliary colic, gout, immobility.   Patient initially underwent laparoscopic cholecystectomy with [facility] (Dr. Sheliah Hatch) 9/5. Intraoperative course was notable for only partial cholecystectomy completion due to limited visualization in the setting of hepatomegaly/significant steatosis. EBL , drain placed. Postoperative course was c/b bile leak for which GI was consulted for possible ERCP/stent placement.  On 9/12, patient was noted to have increased WOB, tachypnea and hypotension with subjectively worse abdominal pain. CT Chest/A/P was obtained (limited due to body habitus) but demonstrated postoperative changes, surgical drain in gallbladder fossa, minimal L basilar subsegmental atelectasis, L adrenal adenoma (3.6cm). Patient was transferred to SDU. CXR demonstrated stable cardiomegaly and central pulmonary vascular congestion with minimal bibasilar atelectasis. BiPAP was recommended; however, patient reportedly refused - at that time also refused intubation "even if it means he will die". Made limited DNR.  Concern for worsening clinical status/development of sepsis with Tmax 100.1, tachycardia to 140s, soft BP. Zosyn initiated and TRH consulted for assistance with management. WBC noted to be increased (22K) with AKI (Cr 1.6 from 0.7), hyperbilirubinemia (Tbili 1.8) and lactic acidosis (LA 3.3). Aggressive fluid resuscitation ordered and antibiotics broadened to vanc/cefepime/Flagyl.   Overnight 9/12, patient clinically worsened with persistent hypotension  despite fluids. Code status was changed back to full code. PCCM was consulted for vasopressor initiation and further management of septic shock.   Pertinent Medical History:   Past Medical History:  Diagnosis Date   Acute blood loss anemia 09/02/2012   S/p 1 unit rbcs   Cellulitis    Erosive esophagitis 09/01/2012   NSAID induced.   Gall stones    Gastric ulcer with hemorrhage 09/02/2012   s/p bleeding control tx. Per Dr. Jena Gauss   Gout    Obesity    UTI (lower urinary tract infection)    Significant Hospital Events: Including procedures, antibiotic start and stop dates in addition to other pertinent events   9/5 - Underwent elective lap chole with CCS. Partial cholecystectomy completed in the setting of poor visualization. EBL . Drain left in place. 9/12 - Transferred to SDU for worsening hypoxia, tachypnea, tachycardia. Hypotensive. CT Chest/A/P with postoperative changes, surgical drain in gallbladder fossa, minimal L basilar subsegmental atelectasis, L adrenal adenoma. TRH consulted. Fluids/Zosyn given, broadened to vanc/cefepime/Flagyl. Refused BiPAP/stated he would refuse intubation even if it meant death. Made DNR. Later in shift more hypotensive, PCCM consulted for pressors. Code status changed back to full code. Vasopressors initiated. 9/13 - A-line, LIJ CVC placed. High pressor requirements with NE 38, Neo 70, vaso 0.03.  Interim History / Subjective:  Overnight, acutely worsened with hypotension refractory to fluids PCCM consulted for vasopressor initiation/further management of septic shock Remains on high vasopressor requirements, NE/Neo/vaso Initial plan for ERCP today, too clinically unstable WBC 41.1 (31.8), previously 21.9. LA downtrending Severe AKI/ARF with Cr 3.08 LFTs stable Co-ox not consistent with HF  Objective:  Blood pressure (!) 143/127, pulse (!) 120, temperature 100.1 F (37.8 C), temperature source Axillary, resp. rate (!) 24, height 5\' 7"  (1.702 m),  weight (!) 235 kg, SpO2 96%. CVP:  [15 mmHg-32 mmHg] 18 mmHg  FiO2 (%):  [35 %-91 %]  35 %   Intake/Output Summary (Last 24 hours) at 12/29/2022 0825 Last data filed at 12/29/2022 0800 Gross per 24 hour  Intake 11172.34 ml  Output 330 ml  Net 10842.34 ml   Filed Weights   12/21/22 0543  Weight: (!) 235 kg   Physical Examination: General: Acute-on-chronically ill-appearing morbidly obese man in NAD. HEENT: [place]/AT, anicteric sclera, PERRL, moist mucous membranes. BiPAP mask in place. Neuro:  Awake, though somewhat lethargic. Will intermittently answer questions or shout out from under BiPAP.  Responds to verbal stimuli. Following commands intermittently. Moves all 4 extremities spontaneously. Severe physical deconditioning.   CV: Tachycardic, regular rhythm, no m/g/r. PULM: Breathing even and mildly labored on BiPAP. Lung fields distant, diminished at bilateral bases. GI: Obese, soft, generalized TTP most over upper quadrants, nondistended. Hypoactive bowel sounds. Extremities: Bilateral chronic symmetric LE edema noted. Skin: Warm/dry, +intertrigo to abdominal folds.  Resolved Hospital Problem List:    Assessment & Plan:  Septic shock, presumed biliary source given recent cholelithiasis/biliary colic Sepsis physiology, PCT > 150. CT Chest/A/P 9/12 with postoperative changes, surgical drain in gallbladder fossa, minimal L basilar subsegmental atelectasis, L adrenal adenoma. - ICU level of care - Goal MAP > 65 - Fluid resuscitation as tolerated - Levophed/Neosynephrine titrated to goal MAP, vasopressin; unfortunately pressor requirements remain high - SDS - Trend WBC, fever curve, LA - F/u Cx data - Continue broad-spectrum antibiotics (vanc/cefepime/Flagyl)  Biliary leak S/p partial laparoscopic cholecystectomy 9/5 Intraoperative course was notable for only partial cholecystectomy completion due to limited visualization in the setting of hepatomegaly/significant steatosis. EBL  , drain placed. - CCS primary - Postoperative management per CCS - Blake drain in place - GI consulted for biliary leak/stent placement, ERCP tentatively planned for 9/13 (too clinically unstable) - Multimodal pain control - NPO  Acute hypoxemic respiratory failure OHS/OSA - Supplemental O2 support - BiPAP PRN + QHS - Wean FiO2 for O2 sat > 90% - Pulmonary hygiene - Diuresis limited in the setting of hypotension - Daily CXR, intermittent ABG as needed - Doubt HF given Co-ox 83.4 - Obtain Echo  AKI with progression to ARF, likely ATN in the setting of sepsis/hypotension Cr > 3 9/13, previously at baseline ~0.6 on admission. - Trend BMP - Replete electrolytes as indicated - Monitor I&Os - Avoid nephrotoxic agents as able - Ensure adequate renal perfusion - Low threshold for Nephro consult if Cr/lytes worsen; next step would likely be dialysis and patient adamantly shouted he would "not do it" if it came to that point, would revisit if Cr trend worsening - Consider Uro consult for Foley placement if renal recovery/making urine  Erosive esophagitis - PPI  Gout - Holding home allopurinol for now  Morbid obesity - Lifestyle modifications  Best Practice: (right click and "Reselect all SmartList Selections" daily)   Diet/type: NPO DVT prophylaxis: SCDs, Lovenox GI prophylaxis: PPI Lines: Central line and Arterial Line Foley:  N/A Code Status:  full code Last date of multidisciplinary goals of care discussion [9/13 - D/w sister at bedside, she states that patient had previously told them DNR/DNI; she wishes to respect his wishes to be full code (9/12PM) but would like to revisit GOC conversations if intubation imminent; additionally while discussing renal failure I broached the topic of possible HD and patient shouted "I won't do it".]  Critical care time:    The patient is critically ill with multiple organ system failure and requires high complexity decision making for  assessment and support, frequent evaluation and titration of  therapies, advanced monitoring, review of radiographic studies and interpretation of complex data.   Critical Care Time devoted to patient care services, exclusive of separately billable procedures, described in this note is 41 minutes.  Tim Lair, PA-C [facility] 12/29/22 8:25 AM  Please see [email or web address] for pager details.  From 7A-7P if no response, please call [phone] After hours, please call ELink [phone]

## 2022-12-29 NOTE — Consult Note (Addendum)
WOC Nurse Consult Note: Reason for Consult: Moisture Associated Skin Damage  Wound type: 1. Moisture Associated Skin Damage buttocks/coccyx/sacrum 2. Intertriginous Dermatitis under pannus and in groin  ICD-10 CM Codes for Irritant Dermatitis L24A2 - Due to fecal, urinary or dual incontinence  L30.4  - Erythema intertrigo. Also used for abrasion of the hand, chafing of the skin, dermatitis due to sweating and friction, friction dermatitis, friction eczema, and genital/thigh intertrigo.  Pressure Injury POA: NA  Measurement: Widespread erythema with scattered areas of partial thickness skin loss underneath pannus (linear in nature); also noted to groin, buttocks, and sacrum; ?fungal component  Wound bed: scattered small areas of partial thickness skin loss 100% pink moist  Drainage (amount, consistency, odor) minimal serosanguinous   Dressing procedure/placement/frequency:  Clean underneath pannus with soap and water, dry and apply Tommy Rainwater [place] [phone])  Measure and cut length of InterDry to fit in skin folds that have skin breakdown Tuck InterDry fabric into skin folds in a single layer, allow for 2 inches of overhang from skin edges to allow for wicking to occur May remove to bathe; dry area thoroughly and then tuck into affected areas again Do not apply any creams or ointments when using InterDry DO NOT THROW AWAY FOR 5 DAYS unless soiled with stool DO NOT [facility] product, this will inactivate the silver in the material  New sheet of Interdry should be applied after 5 days of use if patient continues to have skin breakdown   Clean groin/inner thighs,  buttocks, coccyx, sacrum with soap and water.  Dry and apply Gerhardt's Butt Cream to entire area 2 times daily and prn soiling.  May sprinkle over Gerhardt's with floor stock antifungal powder (microguard green label).   POC discussed with bedside nurse and patient. WOC team will not follow at this time. Re-consult if further needs arise.    Thank you,    Priscella Mann MSN, RN-BC, [employer] [phone]

## 2022-12-29 NOTE — Progress Notes (Signed)
PHARMACY - PHYSICIAN COMMUNICATION CRITICAL VALUE ALERT - BLOOD CULTURE IDENTIFICATION (BCID)  [name] is an 37 y.o. male who presented to [facility] on 12/21/2022 with a chief complaint of sepsis following elective lap cholecystectomy  Assessment:  1/3 BCx bottles growing MSSE (more likely contaminant than intra-abd vs pulm source)  Name of physician (or Provider) Contacted: Pecola Leisure (CCM), Kinsinger (CCS)  Current antibiotics: Zyvox, Cefepime, Flagyl  Changes to prescribed antibiotics recommended: continue current abx for likely abd source Patient is on recommended antibiotics - No changes needed  Results for orders placed or performed during the hospital encounter of 12/21/22  Blood Culture ID Panel (Reflexed) (Collected: 12/28/2022 11:47 AM)  Result Value Ref Range   Enterococcus faecalis NOT DETECTED NOT DETECTED   Enterococcus Faecium NOT DETECTED NOT DETECTED   Listeria monocytogenes NOT DETECTED NOT DETECTED   Staphylococcus species DETECTED (A) NOT DETECTED   Staphylococcus aureus (BCID) NOT DETECTED NOT DETECTED   Staphylococcus epidermidis DETECTED (A) NOT DETECTED   Staphylococcus lugdunensis NOT DETECTED NOT DETECTED   Streptococcus species NOT DETECTED NOT DETECTED   Streptococcus agalactiae NOT DETECTED NOT DETECTED   Streptococcus pneumoniae NOT DETECTED NOT DETECTED   Streptococcus pyogenes NOT DETECTED NOT DETECTED   A.calcoaceticus-baumannii NOT DETECTED NOT DETECTED   Bacteroides fragilis NOT DETECTED NOT DETECTED   Enterobacterales NOT DETECTED NOT DETECTED   Enterobacter cloacae complex NOT DETECTED NOT DETECTED   Escherichia coli NOT DETECTED NOT DETECTED   Klebsiella aerogenes NOT DETECTED NOT DETECTED   Klebsiella oxytoca NOT DETECTED NOT DETECTED   Klebsiella pneumoniae NOT DETECTED NOT DETECTED   Proteus species NOT DETECTED NOT DETECTED   Salmonella species NOT DETECTED NOT DETECTED   Serratia marcescens NOT DETECTED NOT DETECTED   Haemophilus  influenzae NOT DETECTED NOT DETECTED   Neisseria meningitidis NOT DETECTED NOT DETECTED   Pseudomonas aeruginosa NOT DETECTED NOT DETECTED   Stenotrophomonas maltophilia NOT DETECTED NOT DETECTED   Candida albicans NOT DETECTED NOT DETECTED   Candida auris NOT DETECTED NOT DETECTED   Candida glabrata NOT DETECTED NOT DETECTED   Candida krusei NOT DETECTED NOT DETECTED   Candida parapsilosis NOT DETECTED NOT DETECTED   Candida tropicalis NOT DETECTED NOT DETECTED   Cryptococcus neoformans/gattii NOT DETECTED NOT DETECTED   Methicillin resistance mecA/C NOT DETECTED NOT DETECTED    [name] A 12/29/2022  2:03 PM

## 2022-12-29 NOTE — Procedures (Addendum)
Central Venous Catheter Insertion Procedure Note  [name]  [id number]  December 07, 1985  Date:12/29/22  Time:1:00 AM   Provider Performing:Kameelah Minish W Mikey Bussing   Procedure: Insertion of Non-tunneled Central Venous [phone]) with US guidance ([id number])   Indication(s) Medication administration  Consent Risks of the procedure as well as the alternatives and risks of each were explained to the patient and/or caregiver.  Consent for the procedure was obtained and is signed in the bedside chart  Anesthesia Topical only with 1% lidocaine   Timeout Verified patient identification, verified procedure, site/side was marked, verified correct patient position, special equipment/implants available, medications/allergies/relevant history reviewed, required imaging and test results available.  Sterile Technique Maximal sterile technique including full sterile barrier drape, hand hygiene, sterile gown, sterile gloves, mask, hair covering, sterile ultrasound probe cover (if used).  Procedure Description Area of catheter insertion was cleaned with chlorhexidine and draped in sterile fashion.  With real-time ultrasound guidance a central venous catheter was placed into the left internal jugular vein. Nonpulsatile blood flow and easy flushing noted in all ports.  The catheter was sutured in place and sterile dressing applied.  Complications/Tolerance None; patient tolerated the procedure well. Chest X-ray is ordered to verify placement for internal jugular or subclavian cannulation.   Chest x-ray is not ordered for femoral cannulation.  EBL Minimal  Specimen(s) None     Joneen Roach, AGACNP-BC [facility]  See Amion for personal pager PCCM on call pager [phone] until 7pm. Please call Elink 7p-7a. [phone]  12/29/2022 1:01 AM

## 2022-12-29 NOTE — Progress Notes (Signed)
8 Days Post-Op  Subjective: Yesterday developed worsened tachycardia and increased O2 requirements. He underwent CT scan which did not show any undrained collections or major Pneumonia. He was transferred to the step down and then upgraded to ICU status for respiratory status and also started on vasopressors.  ROS: See above, otherwise other systems negative  Objective: Vital signs in last 24 hours: Temp:  [98 F (36.7 C)-100.5 F (38.1 C)] 98.2 F (36.8 C) (09/13 1304) Pulse Rate:  [110-152] 110 (09/13 1122) Resp:  [17-47] 20 (09/13 1122) BP: (58-143)/(16-127) 111/62 (09/13 1122) SpO2:  [86 %-99 %] 96 % (09/13 1122) Arterial Line BP: (62-129)/(37-75) 98/59 (09/13 0845) FiO2 (%):  [35 %-91 %] 35 % (09/13 1122) Last BM Date : 12/28/22  Intake/Output from previous day: 09/12 0701 - 09/13 0700 In: 16109 [P.O.:640; I.V.:4188.8; IV Piggyback:6057.2] Out: 330 [Urine:100; Drains:230] Intake/Output this shift: Total I/O In: 846.6 [I.V.:746.7; IV Piggyback:99.9] Out: -   PE: Gen: on bipap and critical ill, arousable Resp: assisted CV: tachycardic Abd: soft, drain with bilious output  Lab Results:  Recent Labs    12/28/22 2112 12/29/22 0014 12/29/22 0107 12/29/22 0129  WBC 31.8*  --  41.1*  --   HGB 11.7*   < > 12.0* 12.9*  HCT 40.6   < > 39.9 38.0*  PLT 316  --  348  --    < > = values in this interval not displayed.   BMET Recent Labs    12/28/22 2039 12/29/22 0014 12/29/22 0107 12/29/22 0129  NA 133*   < > 133* 134*  K 4.1   < > 4.7 4.6  CL 100  --  97*  --   CO2 19*  --  21*  --   GLUCOSE 112*  --  138*  --   BUN 25*  --  28*  --   CREATININE 2.79*  --  3.08*  --   CALCIUM 7.8*  --  7.7*  --    < > = values in this interval not displayed.   PT/INR Recent Labs    12/28/22 1147  LABPROT 15.8*  INR 1.2   CMP     Component Value Date/Time   NA 134 (L) 12/29/2022 0129   NA 139 11/14/2012 0000   K 4.6 12/29/2022 0129   K 4.1 11/14/2012 0000    CL 97 (L) 12/29/2022 0107   CO2 21 (L) 12/29/2022 0107   GLUCOSE 138 (H) 12/29/2022 0107   BUN 28 (H) 12/29/2022 0107   BUN 11 11/14/2012 0000   CREATININE 3.08 (H) 12/29/2022 0107   CREATININE 0.62 11/14/2012 0000   CALCIUM 7.7 (L) 12/29/2022 0107   PROT 6.5 12/29/2022 0107   ALBUMIN 2.5 (L) 12/29/2022 0107   ALBUMIN 2.6 11/14/2012 0000   AST 34 12/29/2022 0107   ALT 38 12/29/2022 0107   ALKPHOS 83 12/29/2022 0107   BILITOT 1.7 (H) 12/29/2022 0107   GFRNONAA 26 (L) 12/29/2022 0107   GFRAA >60 12/30/2017 0619   Lipase     Component Value Date/Time   LIPASE 15 12/29/2022 0107    Studies/Results: DG CHEST PORT 1 VIEW  Result Date: 12/29/2022 CLINICAL DATA:  Central line placement EXAM: PORTABLE CHEST 1 VIEW COMPARISON:  Chest x-ray 12/28/2022 FINDINGS: There is a new left-sided central venous catheter with distal tip in the mid SVC. The heart is enlarged, unchanged. Central pulmonary vascular congestion persists. There are minimal patchy opacities in the lung bases, left greater than right.  No pleural effusion or pneumothorax. Osseous structures are stable. IMPRESSION: 1. New left-sided central venous catheter with distal tip in the mid SVC. No pneumothorax. 2. Stable cardiomegaly with central pulmonary vascular congestion. Electronically Signed   By: Darliss Cheney M.D.   On: 12/29/2022 01:23   [place] EKG SITE RITE  Result Date: 12/28/2022 If Site Rite image not attached, placement could not be confirmed due to current cardiac rhythm.  DG Chest Port 1 View  Result Date: 12/28/2022 CLINICAL DATA:  Tachypnea, tachycardia EXAM: PORTABLE CHEST 1 VIEW COMPARISON:  12:03 p.m. FINDINGS: Lung volumes are small, but are symmetric and are stable since prior examination. Progressive perihilar pulmonary infiltrate is present in keeping with perihilar pulmonary edema. Stable cardiomegaly. No pneumothorax or pleural effusion. IMPRESSION: 1. Progressive perihilar pulmonary edema. Electronically Signed    By: Helyn Numbers M.D.   On: 12/28/2022 22:02   CT CHEST ABDOMEN PELVIS W CONTRAST  Result Date: 12/28/2022 CLINICAL DATA:  Postoperative abdominal pain. EXAM: CT CHEST, ABDOMEN, AND PELVIS WITH CONTRAST TECHNIQUE: Multidetector CT imaging of the chest, abdomen and pelvis was performed following the standard protocol during bolus administration of intravenous contrast. Very limited exam due to body habitus. RADIATION DOSE REDUCTION: This exam was performed according to the departmental dose-optimization program which includes automated exposure control, adjustment of the mA and/or kV according to patient size and/or use of iterative reconstruction technique. CONTRAST:  OMNIPAQUE IOHEXOL 300 MG/ML  SOLN COMPARISON:  August 12, 2022.  October 11, 2015. FINDINGS: CT CHEST FINDINGS Cardiovascular: No significant vascular findings. Normal heart size. No pericardial effusion. Mediastinum/Nodes: No enlarged mediastinal, hilar, or axillary lymph nodes. Thyroid gland, trachea, and esophagus demonstrate no significant findings. Lungs/Pleura: No pneumothorax or pleural effusion is noted. Right lung is clear. Minimal left basilar subsegmental atelectasis is noted. Musculoskeletal: No chest wall mass or suspicious bone lesions identified. CT ABDOMEN PELVIS FINDINGS Hepatobiliary: Status post cholecystectomy. Surgical drain is seen with tip in gallbladder fossa. No definite biliary dilatation is noted. Visualization of hepatic parenchyma is limited due to body habitus, but no definite abnormality is noted. Pancreas: Not well visualized. Spleen: Normal in size without focal abnormality. Adrenals/Urinary Tract: Right adrenal gland appears normal. 3.6 cm left adrenal nodule is noted most consistent with adenoma. No hydronephrosis or renal obstruction is noted. Urinary bladder is decompressed. Stomach/Bowel: Stomach is unremarkable. There is no definite evidence of bowel obstruction or inflammation. Appendix is not  visualized. Vascular/Lymphatic: No significant vascular findings are present. No enlarged abdominal or pelvic lymph nodes. Reproductive: Prostate is unremarkable. Other: No definite ascites or hernia is noted. Musculoskeletal: No definite fracture or other acute osseous abnormality is noted. IMPRESSION: Significantly limited exam due to soft tissue attenuation artifact secondary to body habitus. Status post cholecystectomy, with surgical drain tip seen in gallbladder fossa. Minimal left basilar subsegmental atelectasis. 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 Electronically Signed   By: Lupita Raider M.D.   On: 12/28/2022 14:46   DG Chest Port 1 View  Result Date: 12/28/2022 CLINICAL DATA:  Sepsis. EXAM: PORTABLE CHEST 1 VIEW COMPARISON:  December 22, 2022. FINDINGS: Stable cardiomegaly with central pulmonary vascular congestion. Minimal bibasilar subsegmental atelectasis is noted. Bony thorax is unremarkable. IMPRESSION: Stable cardiomegaly with central pulmonary vascular congestion. Minimal bibasilar subsegmental atelectasis. Electronically Signed   By: Lupita Raider M.D.   On: 12/28/2022 13:42    Anti-infectives: Anti-infectives (From admission, onward)    Start     Dose/Rate Route Frequency Ordered Stop   12/29/22  2200  ceFEPIme (MAXIPIME) 2 g in sodium chloride 0.9 % 100 mL IVPB        2 g 200 mL/hr over 30 Minutes Intravenous Every 12 hours 12/28/22 2127     12/29/22 1215  linezolid (ZYVOX) IVPB 600 mg        600 mg 300 mL/hr over 60 Minutes Intravenous Every 12 hours 12/29/22 1120     12/29/22 0400  vancomycin (VANCOREADY) IVPB 1250 mg/250 mL  Status:  Discontinued        1,250 mg 166.7 mL/hr over 90 Minutes Intravenous Every 12 hours 12/28/22 1512 12/28/22 2127   12/28/22 2126  vancomycin variable dose per unstable renal function (pharmacist dosing)  Status:  Discontinued         Does not apply See admin instructions 12/28/22 2127 12/29/22 1120   12/28/22 2100  ceFEPIme (MAXIPIME) 2 g  in sodium chloride 0.9 % 100 mL IVPB  Status:  Discontinued        2 g 200 mL/hr over 30 Minutes Intravenous Every 8 hours 12/28/22 1512 12/28/22 2127   12/28/22 2100  metroNIDAZOLE (FLAGYL) IVPB 500 mg        500 mg 100 mL/hr over 60 Minutes Intravenous Every 12 hours 12/28/22 1512     12/28/22 1600  vancomycin (VANCOREADY) IVPB 2000 mg/400 mL        2,000 mg 200 mL/hr over 120 Minutes Intravenous  Once 12/28/22 1512 12/28/22 1752   12/28/22 1230  piperacillin-tazobactam (ZOSYN) IVPB 3.375 g  Status:  Discontinued        3.375 g 12.5 mL/hr over 240 Minutes Intravenous Every 8 hours 12/28/22 1116 12/28/22 1503   12/21/22 0707  ceFAZolin (ANCEF) 3-0.9 GM/100ML-% IVPB       Note to Pharmacy: [facility]: cabinet override      12/21/22 0707 12/21/22 1914   12/21/22 0600  ceFAZolin (ANCEF) IVPB 2g/100 mL premix  Status:  Discontinued        2 g 200 mL/hr over 30 Minutes Intravenous On call to O.R. 12/21/22 0541 12/21/22 1058       Assessment/Plan  37 yo male POD 7 lap partial cholecystectomy for chronic calculous cholecystitis. No biloma on imaging. Drain persistently bilious with slightly decreased output, could possibly be related to fluid status more than scarring.  Sepsis abdominal vs pneumonia, on cefepime and vac, ICU team helping with shock and respiratory   FEN - NPO VTE - change from lovenox to heparin sq given anuria/AKI ID - cefepime vanc Dispo - ICU, critically ill, discussed situation with PCCM team and family. I am very concerned given his size and difficulty in getting agreement throughout this treatment as well as the unknown nature of this infection if he will improve  I reviewed last 24 h vitals and pain scores, last 48 h intake and output, last 24 h labs and trends, and last 24 h imaging results.  This care required high  level of medical decision making.    LOS: 7 days   De Blanch [facility] Surgery 12/29/2022, 1:26 PM Please see Amion  for pager number during day hours 7:00am-4:30pm or 7:00am -11:30am on weekends

## 2022-12-29 NOTE — Progress Notes (Signed)
Subjective: Uncomfortable with the BIPAP.  He wants to go home.  Objective: Vital signs in last 24 hours: Temp:  [98 F (36.7 C)-100.5 F (38.1 C)] 99.2 F (37.3 C) (09/13 0846) Pulse Rate:  [111-152] 111 (09/13 0845) Resp:  [17-47] 23 (09/13 0845) BP: (58-143)/(16-127) 122/59 (09/13 0845) SpO2:  [86 %-99 %] 96 % (09/13 0845) Arterial Line BP: (62-129)/(37-75) 98/59 (09/13 0845) FiO2 (%):  [35 %-91 %] 35 % (09/13 0740) Last BM Date : 12/28/22  Intake/Output from previous day: 09/12 0701 - 09/13 0700 In: 40981 [P.O.:640; I.V.:4188.8; IV Piggyback:6057.2] Out: 330 [Urine:100; Drains:230] Intake/Output this shift: Total I/O In: 561 [I.V.:490.6; IV Piggyback:70.4] Out: -   General appearance: agitated, uncomfortable with BIPAP GI: obese, no pain with left sided abdominal palpation  Lab Results: Recent Labs    12/28/22 1147 12/28/22 2112 12/29/22 0014 12/29/22 0107 12/29/22 0129  WBC 21.9* 31.8*  --  41.1*  --   HGB 13.5 11.7* 12.6* 12.0* 12.9*  HCT 44.1 40.6 37.0* 39.9 38.0*  PLT 324 316  --  348  --    BMET Recent Labs    12/28/22 1147 12/28/22 2039 12/29/22 0014 12/29/22 0107 12/29/22 0129  NA 135 133* 134* 133* 134*  K 3.9 4.1 4.2 4.7 4.6  CL 97* 100  --  97*  --   CO2 24 19*  --  21*  --   GLUCOSE 135* 112*  --  138*  --   BUN 17 25*  --  28*  --   CREATININE 1.62* 2.79*  --  3.08*  --   CALCIUM 8.8* 7.8*  --  7.7*  --    LFT Recent Labs    12/28/22 2155 12/29/22 0107  PROT 6.4* 6.5  ALBUMIN 2.5* 2.5*  AST 33 34  ALT 36 38  ALKPHOS 80 83  BILITOT 1.7* 1.7*  BILIDIR 0.9*  --   IBILI 0.8  --    PT/INR Recent Labs    12/28/22 1147  LABPROT 15.8*  INR 1.2   Hepatitis Panel No results for input(s): "HEPBSAG", "HCVAB", "HEPAIGM", "HEPBIGM" in the last 72 hours. C-Diff No results for input(s): "CDIFFTOX" in the last 72 hours. Fecal Lactopherrin No results for input(s): "FECLLACTOFRN" in the last 72 hours.  Studies/Results: DG CHEST PORT  1 VIEW  Result Date: 12/29/2022 CLINICAL DATA:  Central line placement EXAM: PORTABLE CHEST 1 VIEW COMPARISON:  Chest x-ray 12/28/2022 FINDINGS: There is a new left-sided central venous catheter with distal tip in the mid SVC. The heart is enlarged, unchanged. Central pulmonary vascular congestion persists. There are minimal patchy opacities in the lung bases, left greater than right. No pleural effusion or pneumothorax. Osseous structures are stable. IMPRESSION: 1. New left-sided central venous catheter with distal tip in the mid SVC. No pneumothorax. 2. Stable cardiomegaly with central pulmonary vascular congestion. Electronically Signed   By: Darliss Cheney M.D.   On: 12/29/2022 01:23   [place] EKG SITE RITE  Result Date: 12/28/2022 If Site Rite image not attached, placement could not be confirmed due to current cardiac rhythm.  DG Chest Port 1 View  Result Date: 12/28/2022 CLINICAL DATA:  Tachypnea, tachycardia EXAM: PORTABLE CHEST 1 VIEW COMPARISON:  12:03 p.m. FINDINGS: Lung volumes are small, but are symmetric and are stable since prior examination. Progressive perihilar pulmonary infiltrate is present in keeping with perihilar pulmonary edema. Stable cardiomegaly. No pneumothorax or pleural effusion. IMPRESSION: 1. Progressive perihilar pulmonary edema. Electronically Signed   By: Lyda Kalata.D.  On: 12/28/2022 22:02   CT CHEST ABDOMEN PELVIS W CONTRAST  Result Date: 12/28/2022 CLINICAL DATA:  Postoperative abdominal pain. EXAM: CT CHEST, ABDOMEN, AND PELVIS WITH CONTRAST TECHNIQUE: Multidetector CT imaging of the chest, abdomen and pelvis was performed following the standard protocol during bolus administration of intravenous contrast. Very limited exam due to body habitus. RADIATION DOSE REDUCTION: This exam was performed according to the departmental dose-optimization program which includes automated exposure control, adjustment of the mA and/or kV according to patient size and/or use of  iterative reconstruction technique. CONTRAST:  OMNIPAQUE IOHEXOL 300 MG/ML  SOLN COMPARISON:  August 12, 2022.  October 11, 2015. FINDINGS: CT CHEST FINDINGS Cardiovascular: No significant vascular findings. Normal heart size. No pericardial effusion. Mediastinum/Nodes: No enlarged mediastinal, hilar, or axillary lymph nodes. Thyroid gland, trachea, and esophagus demonstrate no significant findings. Lungs/Pleura: No pneumothorax or pleural effusion is noted. Right lung is clear. Minimal left basilar subsegmental atelectasis is noted. Musculoskeletal: No chest wall mass or suspicious bone lesions identified. CT ABDOMEN PELVIS FINDINGS Hepatobiliary: Status post cholecystectomy. Surgical drain is seen with tip in gallbladder fossa. No definite biliary dilatation is noted. Visualization of hepatic parenchyma is limited due to body habitus, but no definite abnormality is noted. Pancreas: Not well visualized. Spleen: Normal in size without focal abnormality. Adrenals/Urinary Tract: Right adrenal gland appears normal. 3.6 cm left adrenal nodule is noted most consistent with adenoma. No hydronephrosis or renal obstruction is noted. Urinary bladder is decompressed. Stomach/Bowel: Stomach is unremarkable. There is no definite evidence of bowel obstruction or inflammation. Appendix is not visualized. Vascular/Lymphatic: No significant vascular findings are present. No enlarged abdominal or pelvic lymph nodes. Reproductive: Prostate is unremarkable. Other: No definite ascites or hernia is noted. Musculoskeletal: No definite fracture or other acute osseous abnormality is noted. IMPRESSION: Significantly limited exam due to soft tissue attenuation artifact secondary to body habitus. Status post cholecystectomy, with surgical drain tip seen in gallbladder fossa. Minimal left basilar subsegmental atelectasis. 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 Electronically Signed   By: Lupita Raider M.D.   On: 12/28/2022 14:46   DG  Chest Port 1 View  Result Date: 12/28/2022 CLINICAL DATA:  Sepsis. EXAM: PORTABLE CHEST 1 VIEW COMPARISON:  December 22, 2022. FINDINGS: Stable cardiomegaly with central pulmonary vascular congestion. Minimal bibasilar subsegmental atelectasis is noted. Bony thorax is unremarkable. IMPRESSION: Stable cardiomegaly with central pulmonary vascular congestion. Minimal bibasilar subsegmental atelectasis. Electronically Signed   By: Lupita Raider M.D.   On: 12/28/2022 13:42    Medications: Scheduled:  acetaminophen  1,000 mg Oral TID   allopurinol  100 mg Oral Daily   Chlorhexidine Gluconate Cloth  6 each Topical Daily   Chlorhexidine Gluconate Cloth  6 each Topical Q0600   enoxaparin (LOVENOX) injection  40 mg Subcutaneous Q24H   etomidate       fentaNYL       gabapentin  100 mg Oral QID   Gerhardt's butt cream   Topical BID   hydrocortisone  1 Application Rectal BID   hydrocortisone sod succinate (SOLU-CORTEF) inj  100 mg Intravenous Q8H   insulin aspart  0-20 Units Subcutaneous Q4H   ketamine HCl       lidocaine  1 patch Transdermal Daily   midazolam       mupirocin ointment  1 Application Nasal BID   pantoprazole (PROTONIX) IV  40 mg Intravenous Q12H   phenylephrine       rocuronium bromide       succinylcholine  vancomycin variable dose per unstable renal function (pharmacist dosing)   Does not apply See admin instructions   Continuous:  sodium chloride Stopped (12/29/22 0059)   ceFEPime (MAXIPIME) IV     metronidazole 100 mL/hr at 12/29/22 0900   norepinephrine (LEVOPHED) Adult infusion 38 mcg/min (12/29/22 0900)   phenylephrine (NEO-SYNEPHRINE) Adult infusion     vasopressin 0.03 Units/min (12/29/22 1022)    Assessment/Plan: 1) Bile leak. 2) AKI. 3) Hypotension. 4) Respiratory distress. 5) Leuckocytosis.   He is demonstrating multisystem organ failure.  The reason for this acute change is not known.  His CT scans from yesterday do not yield an intra-abdominal  source.  He is currently stable and he did not need to be intubated last night.  There is a mild decrease in the biliary drain.  After conferring with Dr. Sheliah Hatch, the ERCP was cancelled.  He is medically unstable to undergo the procedure.  Plan: 1) Agree with levo, midodrine, and vasopressin. 2) Continue with cefepime. 3) GI will chart monitor.  If any questions this weekend [facility] will be covering.    LOS: 7 days   Anaissa Macfadden D 12/29/2022, 10:53 AM

## 2022-12-29 NOTE — Progress Notes (Signed)
Pt placed on BIPAP at this time. Tolerating well

## 2022-12-29 NOTE — Progress Notes (Signed)
   12/29/22 1810  Spiritual Encounters  Type of Visit Initial  Care provided to: Pt and family  Conversation partners present during encounter Nurse  Referral source Nurse (RN/NT/LPN)  Reason for visit Advance directives  OnCall Visit Yes   Chaplain responded to a call for Advanced Directive completion. With the help of family members the patient Samier was able to stay awake and complete the paperwork he wanted executed.  Notary was present for signatures as well as tow witnesses. The paperwork was up loaded to [employer] a hard copy is in her file and the family received copies of the documents.   Valerie Roys [facility]  [phone]

## 2022-12-30 DIAGNOSIS — Z7401 Bed confinement status: Secondary | ICD-10-CM

## 2022-12-30 DIAGNOSIS — K8010 Calculus of gallbladder with chronic cholecystitis without obstruction: Secondary | ICD-10-CM | POA: Diagnosis not present

## 2022-12-30 DIAGNOSIS — N179 Acute kidney failure, unspecified: Secondary | ICD-10-CM | POA: Insufficient documentation

## 2022-12-30 DIAGNOSIS — Z593 Problems related to living in residential institution: Secondary | ICD-10-CM

## 2022-12-30 LAB — PROTIME-INR
INR: 1.1 (ref 0.8–1.2)
Prothrombin Time: 14.2 s (ref 11.4–15.2)

## 2022-12-30 LAB — COMPREHENSIVE METABOLIC PANEL
ALT: 32 U/L (ref 0–44)
AST: 22 U/L (ref 15–41)
Albumin: 2.2 g/dL — ABNORMAL LOW (ref 3.5–5.0)
Alkaline Phosphatase: 79 U/L (ref 38–126)
Anion gap: 16 — ABNORMAL HIGH (ref 5–15)
BUN: 44 mg/dL — ABNORMAL HIGH (ref 6–20)
CO2: 19 mmol/L — ABNORMAL LOW (ref 22–32)
Calcium: 7.4 mg/dL — ABNORMAL LOW (ref 8.9–10.3)
Chloride: 99 mmol/L (ref 98–111)
Creatinine, Ser: 3.59 mg/dL — ABNORMAL HIGH (ref 0.61–1.24)
GFR, Estimated: 21 mL/min — ABNORMAL LOW (ref >60)
Glucose, Bld: 107 mg/dL — ABNORMAL HIGH (ref 70–99)
Potassium: 4.1 mmol/L (ref 3.5–5.1)
Sodium: 134 mmol/L — ABNORMAL LOW (ref 135–145)
Total Bilirubin: 0.9 mg/dL (ref 0.3–1.2)
Total Protein: 5.6 g/dL — ABNORMAL LOW (ref 6.5–8.1)

## 2022-12-30 LAB — CBC
HCT: 33.2 % — ABNORMAL LOW (ref 39.0–52.0)
Hemoglobin: 10.3 g/dL — ABNORMAL LOW (ref 13.0–17.0)
MCH: 28.5 pg (ref 26.0–34.0)
MCHC: 31 g/dL (ref 30.0–36.0)
MCV: 91.7 fL (ref 80.0–100.0)
Platelets: 203 10*3/uL (ref 150–400)
RBC: 3.62 MIL/uL — ABNORMAL LOW (ref 4.22–5.81)
RDW: 14 % (ref 11.5–15.5)
WBC: 25.7 10*3/uL — ABNORMAL HIGH (ref 4.0–10.5)
nRBC: 0.1 % (ref 0.0–0.2)

## 2022-12-30 LAB — GLUCOSE, CAPILLARY
Glucose-Capillary: 71 mg/dL (ref 70–99)
Glucose-Capillary: 133 mg/dL — ABNORMAL HIGH (ref 70–99)
Glucose-Capillary: 137 mg/dL — ABNORMAL HIGH (ref 70–99)
Glucose-Capillary: 122 mg/dL — ABNORMAL HIGH (ref 70–99)
Glucose-Capillary: 129 mg/dL — ABNORMAL HIGH (ref 70–99)

## 2022-12-30 LAB — LIPASE, BLOOD: Lipase: 14 U/L (ref 11–51)

## 2022-12-30 LAB — CULTURE, BLOOD (ROUTINE X 2): Special Requests: ADEQUATE

## 2022-12-30 LAB — PHOSPHORUS: Phosphorus: 4.8 mg/dL — ABNORMAL HIGH (ref 2.5–4.6)

## 2022-12-30 LAB — PREALBUMIN: Prealbumin: 5 mg/dL — ABNORMAL LOW (ref 18–38)

## 2022-12-30 LAB — MAGNESIUM: Magnesium: 1.8 mg/dL (ref 1.7–2.4)

## 2022-12-30 MED ORDER — AMIODARONE HCL IN DEXTROSE 360-4.14 MG/200ML-% IV SOLN
30.0000 mg/h | INTRAVENOUS | Status: AC
  Administered 2022-12-30 (×2): 30 mg/h via INTRAVENOUS
  Filled 2022-12-30: qty 200

## 2022-12-30 MED ORDER — MAGNESIUM SULFATE 2 GM/50ML IV SOLN
2.0000 g | Freq: Once | INTRAVENOUS | Status: AC
  Administered 2022-12-30: 2 g via INTRAVENOUS
  Filled 2022-12-30: qty 50

## 2022-12-30 MED ORDER — ONDANSETRON HCL 4 MG/2ML IJ SOLN
4.0000 mg | Freq: Four times a day (QID) | INTRAMUSCULAR | Status: DC | PRN
  Administered 2023-01-09 (×2): 4 mg via INTRAVENOUS
  Filled 2022-12-30 (×3): qty 2

## 2022-12-30 MED ORDER — METHOCARBAMOL 1000 MG/10ML IJ SOLN
1000.0000 mg | Freq: Four times a day (QID) | INTRAVENOUS | Status: DC | PRN
  Administered 2023-01-02 – 2023-01-11 (×5): 1000 mg via INTRAVENOUS
  Filled 2022-12-30: qty 1000
  Filled 2022-12-30: qty 10
  Filled 2022-12-30 (×4): qty 1000

## 2022-12-30 MED ORDER — SODIUM CHLORIDE 0.9% FLUSH
10.0000 mL | Freq: Two times a day (BID) | INTRAVENOUS | Status: DC
  Administered 2022-12-30 – 2023-01-02 (×5): 10 mL

## 2022-12-30 MED ORDER — DILTIAZEM LOAD VIA INFUSION
10.0000 mg | Freq: Once | INTRAVENOUS | Status: AC
  Administered 2022-12-30: 10 mg via INTRAVENOUS
  Filled 2022-12-30: qty 10

## 2022-12-30 MED ORDER — FENTANYL CITRATE (PF) 100 MCG/2ML IJ SOLN
100.0000 ug | INTRAMUSCULAR | Status: DC | PRN
  Administered 2022-12-30 – 2023-01-02 (×10): 100 ug via INTRAVENOUS
  Filled 2022-12-30 (×10): qty 2

## 2022-12-30 MED ORDER — SODIUM CHLORIDE 0.9 % IV SOLN: 8.0000 mg | Freq: Four times a day (QID) | INTRAVENOUS | Status: DC | PRN

## 2022-12-30 MED ORDER — BISACODYL 10 MG RE SUPP
10.0000 mg | Freq: Every day | RECTAL | Status: DC
  Filled 2022-12-30: qty 1

## 2022-12-30 MED ORDER — SIMETHICONE 40 MG/0.6ML PO SUSP
80.0000 mg | Freq: Four times a day (QID) | ORAL | Status: DC | PRN
  Administered 2023-01-01: 80 mg via ORAL
  Filled 2022-12-30 (×2): qty 1.2

## 2022-12-30 MED ORDER — AMIODARONE HCL IN DEXTROSE 360-4.14 MG/200ML-% IV SOLN
60.0000 mg/h | INTRAVENOUS | Status: AC
  Administered 2022-12-30 (×2): 60 mg/h via INTRAVENOUS

## 2022-12-30 MED ORDER — ALUM & MAG HYDROXIDE-SIMETH 200-200-20 MG/5ML PO SUSP: 30.0000 mL | Freq: Four times a day (QID) | ORAL | Status: DC | PRN

## 2022-12-30 MED ORDER — DIPHENHYDRAMINE HCL 50 MG/ML IJ SOLN
12.5000 mg | Freq: Once | INTRAMUSCULAR | Status: AC | PRN
  Administered 2022-12-30: 12.5 mg via INTRAVENOUS
  Filled 2022-12-30: qty 1

## 2022-12-30 MED ORDER — SODIUM CHLORIDE 0.9% FLUSH: 10.0000 mL | INTRAVENOUS | Status: DC | PRN

## 2022-12-30 MED ORDER — NYSTATIN 100000 UNIT/GM EX POWD
Freq: Two times a day (BID) | CUTANEOUS | Status: DC
  Filled 2022-12-30: qty 15

## 2022-12-30 MED ORDER — LACTATED RINGERS IV BOLUS
1000.0000 mL | Freq: Once | INTRAVENOUS | Status: AC
  Administered 2022-12-30: 1000 mL via INTRAVENOUS

## 2022-12-30 MED ORDER — MAGIC MOUTHWASH: 15.0000 mL | Freq: Four times a day (QID) | ORAL | Status: DC | PRN

## 2022-12-30 MED ORDER — MENTHOL 3 MG MT LOZG: 1.0000 | LOZENGE | OROMUCOSAL | Status: DC | PRN

## 2022-12-30 MED ORDER — AMIODARONE LOAD VIA INFUSION
150.0000 mg | Freq: Once | INTRAVENOUS | Status: AC
  Administered 2022-12-30: 150 mg via INTRAVENOUS
  Filled 2022-12-30: qty 83.34

## 2022-12-30 MED ORDER — LACTATED RINGERS IV BOLUS
1000.0000 mL | Freq: Three times a day (TID) | INTRAVENOUS | Status: AC | PRN
  Administered 2022-12-30: 1000 mL via INTRAVENOUS

## 2022-12-30 MED ORDER — PHENOL 1.4 % MT LIQD: 2.0000 | OROMUCOSAL | Status: DC | PRN

## 2022-12-30 MED ORDER — DILTIAZEM HCL-DEXTROSE 125-5 MG/125ML-% IV SOLN (PREMIX)
5.0000 mg/h | INTRAVENOUS | Status: DC
  Administered 2022-12-30: 5 mg/h via INTRAVENOUS
  Filled 2022-12-30: qty 125

## 2022-12-30 MED ORDER — LACTATED RINGERS IV BOLUS: 1000.0000 mL | Freq: Once | INTRAVENOUS | Status: DC

## 2022-12-30 MED ORDER — HYDROMORPHONE HCL 1 MG/ML IJ SOLN
0.5000 mg | INTRAMUSCULAR | Status: DC | PRN
  Administered 2022-12-30 (×2): 2 mg via INTRAVENOUS
  Filled 2022-12-30 (×2): qty 2

## 2022-12-30 MED ORDER — PROCHLORPERAZINE EDISYLATE 10 MG/2ML IJ SOLN: 5.0000 mg | INTRAMUSCULAR | Status: DC | PRN

## 2022-12-30 MED ORDER — SALINE SPRAY 0.65 % NA SOLN: 1.0000 | Freq: Four times a day (QID) | NASAL | Status: DC | PRN

## 2022-12-30 MED ORDER — NAPHAZOLINE-GLYCERIN 0.012-0.25 % OP SOLN: 1.0000 [drp] | Freq: Four times a day (QID) | OPHTHALMIC | Status: DC | PRN

## 2022-12-30 NOTE — Progress Notes (Signed)
eLink Physician-Brief Progress Note Patient Name: [name] DOB: 06/21/1985 MRN: [id number]   Date of Service  12/30/2022  HPI/Events of Note  Patient with generalized itching, he is morbidly obese and on BIPAP.  eICU Interventions  Benadryl 12.5 mg iv x 1 PRN ordered.        Thomasene Lot [name] 12/30/2022, 4:51 AM

## 2022-12-30 NOTE — Plan of Care (Signed)

## 2022-12-30 NOTE — Progress Notes (Signed)
12/30/2022  ALLEY GORE [id number] Feb 14, 1986  CARE TEAM: PCP: Toma Deiters, MD  Outpatient Care Team: Patient Care Team: Toma Deiters, MD as PCP - General (Internal Medicine) Rourk, Gerrit Friends, MD as Consulting Physician (Gastroenterology)  Inpatient Treatment Team: Treatment Team:  Kinsinger, De Blanch, MD Pccm, Md, MD Barnetta Chapel, MD Early, Derrel Nip, [place] Beatrix Shipper, RN Deveron Furlong, RN Redmond Baseman, RN Crist Fat, MD   Problem List:   Principal Problem:   Chronic calculous cholecystitis Active Problems:   Super morbid obesity with BMI 80   Gastric ulcer with hemorrhage   Peptic ulcer disease   GERD (gastroesophageal reflux disease)   Chronic cholecystitis   Septic shock (HCC)   AKI (acute kidney injury) (HCC)   12/21/2022  Preoperative diagnosis: chronic calculous cholecystitis   Postoperative diagnosis: same    Procedure: laparoscopic partial cholecystectomy   Surgeon: Feliciana Rossetti, M.D.  Findings: large fatty liver, difficult visualization      Assessment [facility] Stay = 8 days) 9 Days Post-Op    Guarded but somewhat stabilizing    Plan:   Assessment/Plan   37 yo super morbidly obese bedridden deconditioned male struggling status post lap partial cholecystectomy for chronic calculous cholecystitis.   Evidence of bile in drain consistent with leaking at gallbladder infundibular bed -unsurprising given only able to do subtotal cholecystectomy.    No evidence of main bile duct injury.    No biloma on imaging. Drain persistently bilious with slightly decreased output, could possibly be related to fluid status more than scarring.  Discussion by Dr. Sheliah Hatch and Dr. Elnoria Howard about ERCP/stenting but given giant body habitus with BMI of 80, holding off for now.  No evidence of elevated lipase nor frank pancreatitis on postop CT scan.  No evidence of bowel injury on CT scan or free air.  I would do an NG  tube to decompress things better just in case.  Suspect ileus occurring  Respiratory status fair.  Trying BiPAP.  Per pulmonary to rule out any PE/pneumonia   Tachycardia with irregular rate consistent with atrial fibrillation.  On amiodarone.  They have not anticoagulated yet but are contemplating.  Okay to do from surgery standpoint  FEN - NPO.  Patient not been able to urinate and CT cannot successfully catheterize due to difficult body habitus and the patient discomfort/agitation.  Reached out to urology.  Dr. Marlou Porch to come in to help figure out if can intubate bladder with Foley vs cystoscopy.  May benefit from renal ultrasound if Foley does not improve things -further critical care  VTE - change from lovenox to heparin sq given anuria/AKI  ID - cefepime vanco per sepsis protocol just in case.  Leukocytosis and half.  Weaning off pressors.  Guardedly hopeful but still concerning  Chronically bedridden with super morbid obesity living at skilled nursing facility and very deconditioned.  Makes recovery challenging as well.  Skin care precautions  Had long discussion with Dr. Enid Derry with pulmonary critical care who helps helping following.  I reached out to Dr. Marlou Porch with urology as well.  Discussed with ICU team and patient at bedside       I reviewed nursing notes, Consultant CCM notes, last 24 h vitals and pain scores, last 48 h intake and output, last 24 h labs and trends, and last 24 h imaging results.  I have reviewed this patient's available data, including medical history, events of note, test results, etc as part of my  evaluation.   A significant portion of that time was spent in counseling. Care during the described time interval was provided by me.  This care required high  level of medical decision making.  12/30/2022    Subjective: (Chief complaint)  Patient with claims of significant abdominal pain.  Has not been able to urinate and feels like he needs to go.   ICU team not able to intubate successfully.  Having loose bowel movements.  Flexi-Seal in place.  ICU nursing in room.  Critical care MD nearby    Objective:  Vital signs:  Vitals:   12/30/22 0645 12/30/22 0700 12/30/22 0715 12/30/22 0800  BP: 99/68 110/76 (!) 117/58   Pulse: (!) 127 (!) 132 (!) 134   Resp: 16 16 16    Temp:    98.1 F (36.7 C)  TempSrc:    Oral  SpO2: 95% 95% 95%   Weight:      Height:        Last BM Date : 12/28/22  Intake/Output   Yesterday:  09/13 0701 - 09/14 0700 In: 2076.2 [I.V.:1130.5; IV Piggyback:945.7] Out: -  This shift:  No intake/output data recorded.  Bowel function:  Flatus: YES  BM:  YES  Drain:  Thinly serous/bilious   Physical Exam:  General: Pt awake/alert in moderate acute distress Eyes: PERRL, normal EOM.  Sclera clear.  No icterus Neuro: CN II-XII intact w/o focal sensory/motor deficits. Lymph: No head/neck/groin lymphadenopathy Psych:  No delerium/psychosis/paranoia.  Oriented x 4 HENT: Normocephalic, Mucus membranes moist.  No thrush Neck: Supple, No tracheal deviation.  No obvious thyromegaly Chest: No pain to chest wall compression.  Good respiratory excursion.  No audible wheezing CV:  Pulses intact.  Regular rhythm.  No major extremity edema MS: Normal AROM mjr joints.  No obvious deformity  Abdomen: Superobese with panniculus.  Drain right upper quadrant mostly serous with scant bile Somewhat firm.  Mildy distended.  Mildly tender at incisions only.  No evidence of peritonitis.  No incarcerated hernias.  Ext:   No deformity.  No mjr edema.  No cyanosis Skin: No petechiae / purpurea.  No major sores.  Warm and dry    Results:   Cultures: Recent Results (from the past 720 hour(s))  MRSA Next Gen by PCR, Nasal     Status: Abnormal   Collection Time: 12/28/22 11:23 AM   Specimen: Nasal Mucosa; Nasal Swab  Result Value Ref Range Status   MRSA by PCR Next Gen DETECTED (A) NOT DETECTED Final    Comment:  RESULT CALLED TO, READ BACK BY AND VERIFIED WITH: B.FOLEY, RN AT 1543 ON 09.12.24 BY N.THOMPSON (NOTE) The GeneXpert MRSA Assay (FDA approved for NASAL specimens only), is one component of a comprehensive MRSA colonization surveillance program. It is not intended to diagnose MRSA infection nor to guide or monitor treatment for MRSA infections. Test performance is not FDA approved in patients less than 32 years old. Performed at [facility], [address], [place], [place] [address]   Culture, blood (x 2)     Status: None (Preliminary result)   Collection Time: 12/28/22 11:47 AM   Specimen: BLOOD RIGHT ARM  Result Value Ref Range Status   Specimen Description   Final    BLOOD RIGHT ARM Performed at [facility], [address], [place], [place] [address]    Special Requests   Final    BOTTLES DRAWN AEROBIC AND ANAEROBIC Blood Culture adequate volume Performed at [facility], [address] [place], [place] [address]    Culture   Final    NO GROWTH 2 DAYS Performed at [facility], [address] [place], [place] [address]    Report Status PENDING  Incomplete  Culture, blood (x 2)     Status: None (Preliminary result)   Collection Time: 12/28/22 11:47 AM   Specimen: BLOOD RIGHT HAND  Result Value Ref Range Status   Specimen Description   Final    BLOOD RIGHT HAND Performed at [facility], [address] [place], [place] [address]    Special Requests   Final    BOTTLES DRAWN AEROBIC ONLY Blood Culture adequate volume Performed at [facility], [address] [place], [place] [address]    Culture  Setup Time   Final    GRAM POSITIVE COCCI IN CLUSTERS AEROBIC BOTTLE ONLY CRITICAL RESULT CALLED TO, READ BACK BY AND VERIFIED WITH: PHARMD N.GOOGOVAC AT 1401 ON 12/29/2022 BY T.SAAD. Performed at [facility], [address], [place], [place] [address]    Culture GRAM POSITIVE COCCI  Final    Report Status PENDING  Incomplete  Blood Culture ID Panel (Reflexed)     Status: Abnormal   Collection Time: 12/28/22 11:47 AM  Result Value Ref Range Status   Enterococcus faecalis NOT DETECTED NOT DETECTED Final   Enterococcus Faecium NOT DETECTED NOT DETECTED Final   Listeria monocytogenes NOT DETECTED NOT DETECTED Final   Staphylococcus species DETECTED (A) NOT DETECTED Final    Comment: CRITICAL RESULT CALLED TO, READ BACK BY AND VERIFIED WITH: PHARMD N.GOOGOVAC AT 1401 ON 12/29/2022 BY T.SAAD.    Staphylococcus aureus (BCID) NOT DETECTED NOT DETECTED Final   Staphylococcus epidermidis DETECTED (A) NOT DETECTED Final    Comment: CRITICAL RESULT CALLED TO, READ BACK BY AND VERIFIED WITH: PHARMD N.GOOGOVAC AT 1401 ON 12/29/2022 BY T.SAAD.    Staphylococcus lugdunensis NOT DETECTED NOT DETECTED Final   Streptococcus species NOT DETECTED NOT DETECTED Final   Streptococcus agalactiae NOT DETECTED NOT DETECTED Final   Streptococcus pneumoniae NOT DETECTED NOT DETECTED Final   Streptococcus pyogenes NOT DETECTED NOT DETECTED Final   A.calcoaceticus-baumannii NOT DETECTED NOT DETECTED Final   Bacteroides fragilis NOT DETECTED NOT DETECTED Final   Enterobacterales NOT DETECTED NOT DETECTED Final   Enterobacter cloacae complex NOT DETECTED NOT DETECTED Final   Escherichia coli NOT DETECTED NOT DETECTED Final   Klebsiella aerogenes NOT DETECTED NOT DETECTED Final   Klebsiella oxytoca NOT DETECTED NOT DETECTED Final   Klebsiella pneumoniae NOT DETECTED NOT DETECTED Final   Proteus species NOT DETECTED NOT DETECTED Final   Salmonella species NOT DETECTED NOT DETECTED Final   Serratia marcescens NOT DETECTED NOT DETECTED Final   Haemophilus influenzae NOT DETECTED NOT DETECTED Final   Neisseria meningitidis NOT DETECTED NOT DETECTED Final   Pseudomonas aeruginosa NOT DETECTED NOT DETECTED Final   Stenotrophomonas maltophilia NOT DETECTED NOT DETECTED Final   Candida albicans NOT DETECTED  NOT DETECTED Final   Candida auris NOT DETECTED NOT DETECTED Final   Candida glabrata NOT DETECTED NOT DETECTED Final   Candida krusei NOT DETECTED NOT DETECTED Final   Candida parapsilosis NOT DETECTED NOT DETECTED Final   Candida tropicalis NOT DETECTED NOT DETECTED Final   Cryptococcus neoformans/gattii NOT DETECTED NOT DETECTED Final   Methicillin resistance mecA/C NOT DETECTED NOT DETECTED Final    Comment: Performed at [facility], [address], [place], [place] [address]  Labs: Results for orders placed or performed during the hospital encounter of 12/21/22 (from the past 48 hour(s))  MRSA Next Gen by PCR, Nasal     Status: Abnormal   Collection Time: 12/28/22 11:23 AM   Specimen: Nasal Mucosa; Nasal Swab  Result Value Ref Range   MRSA by PCR Next Gen DETECTED (A) NOT DETECTED    Comment: RESULT CALLED TO, READ BACK BY AND VERIFIED WITH: B.FOLEY, RN AT 1543 ON 09.12.24 BY N.THOMPSON (NOTE) The GeneXpert MRSA Assay (FDA approved for NASAL specimens only), is one component of a comprehensive MRSA colonization surveillance program. It is not intended to diagnose MRSA infection nor to guide or monitor treatment for MRSA infections. Test performance is not FDA approved in patients less than 4 years old. Performed at [facility], [address], [place], [place] [address]   Glucose, capillary     Status: Abnormal   Collection Time: 12/28/22 11:29 AM  Result Value Ref Range   Glucose-Capillary 126 (H) 70 - 99 mg/dL    Comment: Glucose reference range applies only to samples taken after fasting for at least 8 hours.   Comment 1 Notify RN   Culture, blood (x 2)     Status: None (Preliminary result)   Collection Time: 12/28/22 11:47 AM   Specimen: BLOOD RIGHT ARM  Result Value Ref Range   Specimen Description      BLOOD RIGHT ARM Performed at [facility], [address], [place], [place] [address]    Special Requests      BOTTLES DRAWN  AEROBIC AND ANAEROBIC Blood Culture adequate volume Performed at [facility], [address], [place], [place] [address]    Culture      NO GROWTH 2 DAYS Performed at [facility], [address], [place], [place] [address]    Report Status PENDING   Culture, blood (x 2)     Status: None (Preliminary result)   Collection Time: 12/28/22 11:47 AM   Specimen: BLOOD RIGHT HAND  Result Value Ref Range   Specimen Description      BLOOD RIGHT HAND Performed at [facility], [address], [place], [place] [address]    Special Requests      BOTTLES DRAWN AEROBIC ONLY Blood Culture adequate volume Performed at [facility], [address], [place], [place] [address]    Culture  Setup Time      GRAM POSITIVE COCCI IN CLUSTERS AEROBIC BOTTLE ONLY CRITICAL RESULT CALLED TO, READ BACK BY AND VERIFIED WITH: PHARMD N.GOOGOVAC AT 1401 ON 12/29/2022 BY T.SAAD. Performed at [facility], [address] [place], [place] [address]    Culture GRAM POSITIVE COCCI    Report Status PENDING   CBC with Differential     Status: Abnormal   Collection Time: 12/28/22 11:47 AM  Result Value Ref Range   WBC 21.9 (H) 4.0 - 10.5 K/uL   RBC 4.74 4.22 - 5.81 MIL/uL   Hemoglobin 13.5 13.0 - 17.0 g/dL   HCT 66.4 40.3 - 47.4 %   MCV 93.0 80.0 - 100.0 fL   MCH 28.5 26.0 - 34.0 pg   MCHC 30.6 30.0 - 36.0 g/dL   RDW 25.9 56.3 - 87.5 %   Platelets 324 150 - 400 K/uL   nRBC 0.0 0.0 - 0.2 %   Neutrophils Relative % 94 %   Neutro Abs 20.5 (H) 1.7 - 7.7 K/uL   Lymphocytes  Relative 1 %   Lymphs Abs 0.2 (L) 0.7 - 4.0 K/uL   Monocytes Relative 3 %   Monocytes Absolute 0.7 0.1 - 1.0 K/uL   Eosinophils Relative 0 %   Eosinophils Absolute 0.0 0.0 - 0.5 K/uL   Basophils Relative 0 %   Basophils Absolute 0.1 0.0 - 0.1 K/uL   Immature Granulocytes 2 %   Abs Immature Granulocytes 0.38 (H) 0.00 - 0.07 K/uL    Comment: Performed at [facility],  [address], [place], [place] [address]  Comprehensive metabolic panel     Status: Abnormal   Collection Time: 12/28/22 11:47 AM  Result Value Ref Range   Sodium 135 135 - 145 mmol/L   Potassium 3.9 3.5 - 5.1 mmol/L   Chloride 97 (L) 98 - 111 mmol/L   CO2 24 22 - 32 mmol/L   Glucose, Bld 135 (H) 70 - 99 mg/dL    Comment: Glucose reference range applies only to samples taken after fasting for at least 8 hours.   BUN 17 6 - 20 mg/dL   Creatinine, Ser 0.10 (H) 0.61 - 1.24 mg/dL   Calcium 8.8 (L) 8.9 - 10.3 mg/dL   Total Protein 7.1 6.5 - 8.1 g/dL   Albumin 3.0 (L) 3.5 - 5.0 g/dL   AST 22 15 - 41 U/L   ALT 37 0 - 44 U/L   Alkaline Phosphatase 104 38 - 126 U/L   Total Bilirubin 1.8 (H) 0.3 - 1.2 mg/dL   GFR, Estimated 56 (L) >60 mL/min    Comment: (NOTE) Calculated using the CKD-EPI Creatinine Equation (2021)    Anion gap 14 5 - 15    Comment: Performed at [facility], [address] [place], [place] [address]  Protime-INR     Status: Abnormal   Collection Time: 12/28/22 11:47 AM  Result Value Ref Range   Prothrombin Time 15.8 (H) 11.4 - 15.2 seconds   INR 1.2 0.8 - 1.2    Comment: (NOTE) INR goal varies based on device and disease states. Performed at [facility], [address] [place], [place] [address]   APTT     Status: None   Collection Time: 12/28/22 11:47 AM  Result Value Ref Range   aPTT 34 24 - 36 seconds    Comment: Performed at [facility], [address] [place], [place] [address]  Lactic acid, plasma     Status: Abnormal   Collection Time: 12/28/22 11:47 AM  Result Value Ref Range   Lactic Acid, Venous 3.2 (HH) 0.5 - 1.9 mmol/L    Comment: CRITICAL RESULT CALLED TO, READ BACK BY AND VERIFIED WITH RN B CLAPP AT 1235 12/28/22 CRUICKSHANK A Performed at [facility], [address], [place], [place] [address]   Blood Culture ID Panel (Reflexed)     Status: Abnormal   Collection  Time: 12/28/22 11:47 AM  Result Value Ref Range   Enterococcus faecalis NOT DETECTED NOT DETECTED   Enterococcus Faecium NOT DETECTED NOT DETECTED   Listeria monocytogenes NOT DETECTED NOT DETECTED   Staphylococcus species DETECTED (A) NOT DETECTED    Comment: CRITICAL RESULT CALLED TO, READ BACK BY AND VERIFIED WITH: PHARMD N.GOOGOVAC AT 1401 ON 12/29/2022 BY T.SAAD.    Staphylococcus aureus (BCID) NOT DETECTED NOT DETECTED   Staphylococcus epidermidis DETECTED (A) NOT DETECTED    Comment: CRITICAL RESULT CALLED TO, READ BACK BY AND VERIFIED WITH: PHARMD N.GOOGOVAC AT 1401 ON 12/29/2022 BY  T.SAAD.    Staphylococcus lugdunensis NOT DETECTED NOT DETECTED   Streptococcus species NOT DETECTED NOT DETECTED   Streptococcus agalactiae NOT DETECTED NOT DETECTED   Streptococcus pneumoniae NOT DETECTED NOT DETECTED   Streptococcus pyogenes NOT DETECTED NOT DETECTED   A.calcoaceticus-baumannii NOT DETECTED NOT DETECTED   Bacteroides fragilis NOT DETECTED NOT DETECTED   Enterobacterales NOT DETECTED NOT DETECTED   Enterobacter cloacae complex NOT DETECTED NOT DETECTED   Escherichia coli NOT DETECTED NOT DETECTED   Klebsiella aerogenes NOT DETECTED NOT DETECTED   Klebsiella oxytoca NOT DETECTED NOT DETECTED   Klebsiella pneumoniae NOT DETECTED NOT DETECTED   Proteus species NOT DETECTED NOT DETECTED   Salmonella species NOT DETECTED NOT DETECTED   Serratia marcescens NOT DETECTED NOT DETECTED   Haemophilus influenzae NOT DETECTED NOT DETECTED   Neisseria meningitidis NOT DETECTED NOT DETECTED   Pseudomonas aeruginosa NOT DETECTED NOT DETECTED   Stenotrophomonas maltophilia NOT DETECTED NOT DETECTED   Candida albicans NOT DETECTED NOT DETECTED   Candida auris NOT DETECTED NOT DETECTED   Candida glabrata NOT DETECTED NOT DETECTED   Candida krusei NOT DETECTED NOT DETECTED   Candida parapsilosis NOT DETECTED NOT DETECTED   Candida tropicalis NOT DETECTED NOT DETECTED   Cryptococcus  neoformans/gattii NOT DETECTED NOT DETECTED   Methicillin resistance mecA/C NOT DETECTED NOT DETECTED    Comment: Performed at [facility], [address], [place], [place] [address]  Lactic acid, plasma     Status: Abnormal   Collection Time: 12/28/22  1:12 PM  Result Value Ref Range   Lactic Acid, Venous 3.3 (HH) 0.5 - 1.9 mmol/L    Comment: CRITICAL VALUE NOTED. VALUE IS CONSISTENT WITH PREVIOUSLY REPORTED/CALLED VALUE Performed at [facility], [address] [place], [place] [address]   Lactic acid, plasma     Status: Abnormal   Collection Time: 12/28/22  4:36 PM  Result Value Ref Range   Lactic Acid, Venous 2.9 (HH) 0.5 - 1.9 mmol/L    Comment: CRITICAL VALUE NOTED. VALUE IS CONSISTENT WITH PREVIOUSLY REPORTED/CALLED VALUE Performed at [facility], [address], [place], [place] [address]   Glucose, capillary     Status: None   Collection Time: 12/28/22  4:36 PM  Result Value Ref Range   Glucose-Capillary 96 70 - 99 mg/dL    Comment: Glucose reference range applies only to samples taken after fasting for at least 8 hours.  Basic metabolic panel     Status: Abnormal   Collection Time: 12/28/22  8:39 PM  Result Value Ref Range   Sodium 133 (L) 135 - 145 mmol/L   Potassium 4.1 3.5 - 5.1 mmol/L   Chloride 100 98 - 111 mmol/L   CO2 19 (L) 22 - 32 mmol/L   Glucose, Bld 112 (H) 70 - 99 mg/dL    Comment: Glucose reference range applies only to samples taken after fasting for at least 8 hours.   BUN 25 (H) 6 - 20 mg/dL   Creatinine, Ser 2.13 (H) 0.61 - 1.24 mg/dL    Comment: DELTA CHECK NOTED   Calcium 7.8 (L) 8.9 - 10.3 mg/dL   GFR, Estimated 29 (L) >60 mL/min    Comment: (NOTE) Calculated using the CKD-EPI Creatinine Equation (2021)    Anion gap 14 5 - 15    Comment: Performed at [facility], [address] [place], [place] [address]  Lactic acid, plasma     Status: Abnormal   Collection Time: 12/28/22  8:39 PM  Result Value Ref Range   Lactic Acid, Venous 2.7 (HH) 0.5 - 1.9 mmol/L    Comment: CRITICAL VALUE NOTED. VALUE IS CONSISTENT WITH PREVIOUSLY REPORTED/CALLED VALUE Performed at [facility], [address] [place], [place] [address]   Blood gas, arterial     Status: Abnormal   Collection Time: 12/28/22  8:45 PM  Result Value Ref Range   O2 Content 4.0 L/min   Delivery systems NASAL CANNULA    pH, Arterial 7.21 (L) 7.35 - 7.45   pCO2 arterial 54 (H) 32 - 48 mmHg   pO2, Arterial 75 (L) 83 - 108 mmHg   Bicarbonate 21.6 20.0 - 28.0 mmol/L   Acid-base deficit 6.8 (H) 0.0 - 2.0 mmol/L   O2 Saturation 97.1 %   Patient temperature 37.1    Collection site A-LINE    Drawn by [id number]    Allens test (pass/fail) PASS PASS    Comment: Performed at [facility], [address] [place], [place] [address]  Blood gas, venous     Status: Abnormal   Collection Time: 12/28/22  9:04 PM  Result Value Ref Range   pH, Ven 7.16 (LL) 7.25 - 7.43    Comment: CRITICAL RESULT CALLED TO, READ BACK BY AND VERIFIED WITH: CALDER,S RN @ 2122 12/28/22 BY CHILDRESS,E    pCO2, Ven 66 (H) 44 - 60 mmHg   pO2, Ven 43 32 - 45 mmHg   Bicarbonate 23.3 20.0 - 28.0 mmol/L   Acid-base deficit 6.3 (H) 0.0 - 2.0 mmol/L   O2 Saturation 73 %   Patient temperature 37.7     Comment: Performed at [facility], [address], [place], [place] [address]  CBC with Differential/Platelet     Status: Abnormal   Collection Time: 12/28/22  9:12 PM  Result Value Ref Range   WBC 31.8 (H) 4.0 - 10.5 K/uL   RBC 4.20 (L) 4.22 - 5.81 MIL/uL   Hemoglobin 11.7 (L) 13.0 - 17.0 g/dL   HCT 57.8 46.9 - 62.9 %   MCV 96.7 80.0 - 100.0 fL   MCH 27.9 26.0 - 34.0 pg   MCHC 28.8 (L) 30.0 - 36.0 g/dL   RDW 52.8 41.3 - 24.4 %   Platelets 316 150 - 400 K/uL   nRBC 0.0 0.0 - 0.2 %   Neutrophils Relative % 89 %   Neutro Abs 28.5 (H) 1.7 - 7.7 K/uL   Lymphocytes Relative 1 %   Lymphs  Abs 0.3 (L) 0.7 - 4.0 K/uL   Monocytes Relative 5 %   Monocytes Absolute 1.5 (H) 0.1 - 1.0 K/uL   Eosinophils Relative 0 %   Eosinophils Absolute 0.0 0.0 - 0.5 K/uL   Basophils Relative 1 %   Basophils Absolute 0.2 (H) 0.0 - 0.1 K/uL   WBC Morphology DOHLE BODIES     Comment: Moderate Left Shift (>5% metas and myelos)   Immature Granulocytes 4 %   Abs Immature Granulocytes 1.33 (H) 0.00 - 0.07 K/uL    Comment: Performed at [facility], [address], [place], [place] [address]  Type and screen [facility]     Status: None   Collection Time: 12/28/22  9:12 PM  Result Value Ref Range   ABO/RH(D) A POS    Antibody Screen NEG    Sample Expiration      12/31/2022,2359 Performed at [facility], [address] [place], [place] [address]  Glucose, capillary     Status: Abnormal   Collection Time: 12/28/22  9:43 PM  Result Value Ref Range   Glucose-Capillary 105 (H) 70 - 99 mg/dL    Comment: Glucose reference range applies only to samples taken after fasting for at least 8 hours.  Hepatic function panel     Status: Abnormal   Collection Time: 12/28/22  9:55 PM  Result Value Ref Range   Total Protein 6.4 (L) 6.5 - 8.1 g/dL   Albumin 2.5 (L) 3.5 - 5.0 g/dL   AST 33 15 - 41 U/L   ALT 36 0 - 44 U/L   Alkaline Phosphatase 80 38 - 126 U/L   Total Bilirubin 1.7 (H) 0.3 - 1.2 mg/dL   Bilirubin, Direct 0.9 (H) 0.0 - 0.2 mg/dL   Indirect Bilirubin 0.8 0.3 - 0.9 mg/dL    Comment: Performed at [facility], [address], [place], [place] [address]  Glucose, capillary     Status: Abnormal   Collection Time: 12/28/22 11:57 PM  Result Value Ref Range   Glucose-Capillary 107 (H) 70 - 99 mg/dL    Comment: Glucose reference range applies only to samples taken after fasting for at least 8 hours.  I-STAT 7, (LYTES, BLD GAS, ICA, H+H)     Status: Abnormal   Collection Time: 12/29/22 12:14 AM  Result Value Ref Range    pH, Arterial 7.233 (L) 7.35 - 7.45   pCO2 arterial 50.8 (H) 32 - 48 mmHg   pO2, Arterial 72 (L) 83 - 108 mmHg   Bicarbonate 21.5 20.0 - 28.0 mmol/L   TCO2 23 22 - 32 mmol/L   O2 Saturation 91 %   Acid-base deficit 6.0 (H) 0.0 - 2.0 mmol/L   Sodium 134 (L) 135 - 145 mmol/L   Potassium 4.2 3.5 - 5.1 mmol/L   Calcium, Ion 1.01 (L) 1.15 - 1.40 mmol/L   HCT 37.0 (L) 39.0 - 52.0 %   Hemoglobin 12.6 (L) 13.0 - 17.0 g/dL   Patient temperature 60.4 F    Collection site RADIAL, ALLEN'S TEST ACCEPTABLE    Drawn by RT    Sample type ARTERIAL   Procalcitonin     Status: None   Collection Time: 12/29/22  1:07 AM  Result Value Ref Range   Procalcitonin >150.00 ng/mL    Comment:        Interpretation: PCT >= 10 ng/mL: Important systemic inflammatory response, almost exclusively due to severe bacterial sepsis or septic shock. (NOTE)       Sepsis PCT Algorithm           Lower Respiratory Tract                                      Infection PCT Algorithm    ----------------------------     ----------------------------         PCT < 0.25 ng/mL                PCT < 0.10 ng/mL          Strongly encourage             Strongly discourage   discontinuation of antibiotics    initiation of antibiotics    ----------------------------     -----------------------------       PCT 0.25 - 0.50 ng/mL            PCT 0.10 -  0.25 ng/mL               OR       >80% decrease in PCT            Discourage initiation of                                            antibiotics      Encourage discontinuation           of antibiotics    ----------------------------     -----------------------------         PCT >= 0.50 ng/mL              PCT 0.26 - 0.50 ng/mL                AND       <80% decrease in PCT             Encourage initiation of                                             antibiotics       Encourage continuation           of antibiotics    ----------------------------     -----------------------------         PCT >= 0.50 ng/mL                  PCT > 0.50 ng/mL               AND         increase in PCT                  Strongly encourage                                      initiation of antibiotics    Strongly encourage escalation           of antibiotics                                     -----------------------------                                           PCT <= 0.25 ng/mL                                                 OR                                        > 80% decrease in PCT  Discontinue / Do not initiate                                             antibiotics  Performed at [facility], [address], [place], [place] [address]   Magnesium     Status: Abnormal   Collection Time: 12/29/22  1:07 AM  Result Value Ref Range   Magnesium 1.5 (L) 1.7 - 2.4 mg/dL    Comment: Performed at [facility], [address], [place], [place] [address]  Phosphorus     Status: Abnormal   Collection Time: 12/29/22  1:07 AM  Result Value Ref Range   Phosphorus 4.7 (H) 2.5 - 4.6 mg/dL    Comment: Performed at [facility], [address] [place], [place] [address]  CBC     Status: Abnormal   Collection Time: 12/29/22  1:07 AM  Result Value Ref Range   WBC 41.1 (H) 4.0 - 10.5 K/uL   RBC 4.18 (L) 4.22 - 5.81 MIL/uL   Hemoglobin 12.0 (L) 13.0 - 17.0 g/dL   HCT 28.4 13.2 - 44.0 %   MCV 95.5 80.0 - 100.0 fL   MCH 28.7 26.0 - 34.0 pg   MCHC 30.1 30.0 - 36.0 g/dL   RDW 10.2 72.5 - 36.6 %   Platelets 348 150 - 400 K/uL   nRBC 0.1 0.0 - 0.2 %    Comment: Performed at [facility], [address] [place], [place] [address]  Comprehensive metabolic panel     Status: Abnormal   Collection Time: 12/29/22  1:07 AM  Result Value Ref Range   Sodium 133 (L) 135 - 145 mmol/L   Potassium 4.7 3.5 - 5.1 mmol/L   Chloride 97 (L) 98 - 111 mmol/L   CO2 21 (L) 22 - 32 mmol/L   Glucose,  Bld 138 (H) 70 - 99 mg/dL    Comment: Glucose reference range applies only to samples taken after fasting for at least 8 hours.   BUN 28 (H) 6 - 20 mg/dL   Creatinine, Ser 7.42 (H) 0.61 - 1.24 mg/dL   Calcium 7.7 (L) 8.9 - 10.3 mg/dL   Total Protein 6.5 6.5 - 8.1 g/dL   Albumin 2.5 (L) 3.5 - 5.0 g/dL   AST 34 15 - 41 U/L   ALT 38 0 - 44 U/L   Alkaline Phosphatase 83 38 - 126 U/L   Total Bilirubin 1.7 (H) 0.3 - 1.2 mg/dL   GFR, Estimated 26 (L) >60 mL/min    Comment: (NOTE) Calculated using the CKD-EPI Creatinine Equation (2021)    Anion gap 15 5 - 15    Comment: Performed at [facility], [address] [place], [place] [address]  Lipase, blood     Status: None   Collection Time: 12/29/22  1:07 AM  Result Value Ref Range   Lipase 15 11 - 51 U/L    Comment: Performed at [facility], [address] [place], [place] [address]  Cortisol     Status: None   Collection Time: 12/29/22  1:07 AM  Result Value Ref Range   Cortisol, Plasma >100.0 ug/dL    Comment: RESULT CONFIRMED BY MANUAL DILUTION (NOTE) AM    6.7 - 22.6 ug/dL PM   <33.2       ug/dL Performed at  [facility], [address], [place], [place] [address]   Lactic acid, plasma     Status: Abnormal   Collection Time: 12/29/22  1:07 AM  Result Value Ref Range   Lactic Acid, Venous 2.4 (HH) 0.5 - 1.9 mmol/L    Comment: CRITICAL VALUE NOTED. VALUE IS CONSISTENT WITH PREVIOUSLY REPORTED/CALLED VALUE Performed at [facility], [address] [place], [place] [address]   I-STAT 7, (LYTES, BLD GAS, ICA, H+H)     Status: Abnormal   Collection Time: 12/29/22  1:29 AM  Result Value Ref Range   pH, Arterial 7.233 (L) 7.35 - 7.45   pCO2 arterial 52.1 (H) 32 - 48 mmHg   pO2, Arterial 122 (H) 83 - 108 mmHg   Bicarbonate 22.0 20.0 - 28.0 mmol/L   TCO2 24 22 - 32 mmol/L   O2 Saturation 98 %   Acid-base deficit 6.0 (H) 0.0 - 2.0 mmol/L   Sodium 134 (L) 135 - 145  mmol/L   Potassium 4.6 3.5 - 5.1 mmol/L   Calcium, Ion 1.03 (L) 1.15 - 1.40 mmol/L   HCT 38.0 (L) 39.0 - 52.0 %   Hemoglobin 12.9 (L) 13.0 - 17.0 g/dL   Patient temperature 29.5 F    Collection site RADIAL, ALLEN'S TEST ACCEPTABLE    Drawn by RT    Sample type ARTERIAL   Lactic acid, plasma     Status: Abnormal   Collection Time: 12/29/22  4:50 AM  Result Value Ref Range   Lactic Acid, Venous 2.0 (HH) 0.5 - 1.9 mmol/L    Comment: CRITICAL VALUE NOTED. VALUE IS CONSISTENT WITH PREVIOUSLY REPORTED/CALLED VALUE Performed at [facility], [address] [place], [place] [address]   Cooxemetry Panel (carboxy, met, total hgb, O2 sat)     Status: Abnormal   Collection Time: 12/29/22  4:50 AM  Result Value Ref Range   Total hemoglobin 12.5 12.0 - 16.0 g/dL   O2 Saturation 86.5 %   Carboxyhemoglobin 2.1 (H) 0.5 - 1.5 %   Methemoglobin <0.7 0.0 - 1.5 %    Comment: Performed at [facility], [address], [place], [place] [address]  Glucose, capillary     Status: Abnormal   Collection Time: 12/29/22  5:05 AM  Result Value Ref Range   Glucose-Capillary 170 (H) 70 - 99 mg/dL    Comment: Glucose reference range applies only to samples taken after fasting for at least 8 hours.  Glucose, capillary     Status: Abnormal   Collection Time: 12/29/22  8:06 AM  Result Value Ref Range   Glucose-Capillary 178 (H) 70 - 99 mg/dL    Comment: Glucose reference range applies only to samples taken after fasting for at least 8 hours.   Comment 1 Notify RN    Comment 2 Document in Chart   Glucose, capillary     Status: Abnormal   Collection Time: 12/29/22 11:36 AM  Result Value Ref Range   Glucose-Capillary 153 (H) 70 - 99 mg/dL    Comment: Glucose reference range applies only to samples taken after fasting for at least 8 hours.  Glucose, capillary     Status: Abnormal   Collection Time: 12/29/22  4:25 PM  Result Value Ref Range   Glucose-Capillary 128 (H) 70 - 99  mg/dL    Comment: Glucose reference range applies only to samples taken after fasting for at least 8 hours.   Comment 1 Notify RN    Comment  2 Document in Chart   Glucose, capillary     Status: Abnormal   Collection Time: 12/29/22  7:51 PM  Result Value Ref Range   Glucose-Capillary 115 (H) 70 - 99 mg/dL    Comment: Glucose reference range applies only to samples taken after fasting for at least 8 hours.  Glucose, capillary     Status: Abnormal   Collection Time: 12/29/22 10:09 PM  Result Value Ref Range   Glucose-Capillary 105 (H) 70 - 99 mg/dL    Comment: Glucose reference range applies only to samples taken after fasting for at least 8 hours.   Comment 1 Notify RN    Comment 2 Document in Chart   CBC     Status: Abnormal   Collection Time: 12/30/22  7:07 AM  Result Value Ref Range   WBC 25.7 (H) 4.0 - 10.5 K/uL   RBC 3.62 (L) 4.22 - 5.81 MIL/uL   Hemoglobin 10.3 (L) 13.0 - 17.0 g/dL   HCT 16.1 (L) 09.6 - 04.5 %   MCV 91.7 80.0 - 100.0 fL   MCH 28.5 26.0 - 34.0 pg   MCHC 31.0 30.0 - 36.0 g/dL   RDW 40.9 81.1 - 91.4 %   Platelets 203 150 - 400 K/uL   nRBC 0.1 0.0 - 0.2 %    Comment: Performed at [facility], [address] [place], [place] [address]  Comprehensive metabolic panel     Status: Abnormal   Collection Time: 12/30/22  7:07 AM  Result Value Ref Range   Sodium 134 (L) 135 - 145 mmol/L   Potassium 4.1 3.5 - 5.1 mmol/L   Chloride 99 98 - 111 mmol/L   CO2 19 (L) 22 - 32 mmol/L   Glucose, Bld 107 (H) 70 - 99 mg/dL    Comment: Glucose reference range applies only to samples taken after fasting for at least 8 hours.   BUN 44 (H) 6 - 20 mg/dL   Creatinine, Ser 6.21 (H) 0.61 - 1.24 mg/dL   Calcium 7.4 (L) 8.9 - 10.3 mg/dL   Total Protein 5.6 (L) 6.5 - 8.1 g/dL   Albumin 2.2 (L) 3.5 - 5.0 g/dL   AST 22 15 - 41 U/L   ALT 32 0 - 44 U/L   Alkaline Phosphatase 79 38 - 126 U/L   Total Bilirubin 0.9 0.3 - 1.2 mg/dL   GFR, Estimated 21 (L) >60 mL/min     Comment: (NOTE) Calculated using the CKD-EPI Creatinine Equation (2021)    Anion gap 16 (H) 5 - 15    Comment: Performed at [facility], [address] [place], [place] [address]  Magnesium     Status: None   Collection Time: 12/30/22  7:07 AM  Result Value Ref Range   Magnesium 1.8 1.7 - 2.4 mg/dL    Comment: Performed at [facility], [address], [place], [place] [address]  Phosphorus     Status: Abnormal   Collection Time: 12/30/22  7:07 AM  Result Value Ref Range   Phosphorus 4.8 (H) 2.5 - 4.6 mg/dL    Comment: Performed at [facility], [address] [place], [place] [address]  Protime-INR     Status: None   Collection Time: 12/30/22  7:07 AM  Result Value Ref Range   Prothrombin Time 14.2 11.4 - 15.2 seconds   INR 1.1 0.8 - 1.2    Comment: (NOTE) INR goal varies based on device and disease states.  Performed at [facility], [address], [place], [place] [address]   Glucose, capillary     Status: None   Collection Time: 12/30/22  7:48 AM  Result Value Ref Range   Glucose-Capillary 71 70 - 99 mg/dL    Comment: Glucose reference range applies only to samples taken after fasting for at least 8 hours.   Comment 1 Notify RN    Comment 2 Document in Chart   Lipase, blood     Status: None   Collection Time: 12/30/22  9:38 AM  Result Value Ref Range   Lipase 14 11 - 51 U/L    Comment: Performed at [facility], [address] [place], [place] [address]    Imaging / Studies: DG CHEST PORT 1 VIEW  Result Date: 12/29/2022 CLINICAL DATA:  Central line placement EXAM: PORTABLE CHEST 1 VIEW COMPARISON:  Chest x-ray 12/28/2022 FINDINGS: There is a new left-sided central venous catheter with distal tip in the mid SVC. The heart is enlarged, unchanged. Central pulmonary vascular congestion persists. There are minimal patchy opacities in the lung bases, left greater than right. No  pleural effusion or pneumothorax. Osseous structures are stable. IMPRESSION: 1. New left-sided central venous catheter with distal tip in the mid SVC. No pneumothorax. 2. Stable cardiomegaly with central pulmonary vascular congestion. Electronically Signed   By: Darliss Cheney M.D.   On: 12/29/2022 01:23   [place] EKG SITE RITE  Result Date: 12/28/2022 If Site Rite image not attached, placement could not be confirmed due to current cardiac rhythm.  DG Chest Port 1 View  Result Date: 12/28/2022 CLINICAL DATA:  Tachypnea, tachycardia EXAM: PORTABLE CHEST 1 VIEW COMPARISON:  12:03 p.m. FINDINGS: Lung volumes are small, but are symmetric and are stable since prior examination. Progressive perihilar pulmonary infiltrate is present in keeping with perihilar pulmonary edema. Stable cardiomegaly. No pneumothorax or pleural effusion. IMPRESSION: 1. Progressive perihilar pulmonary edema. Electronically Signed   By: Helyn Numbers M.D.   On: 12/28/2022 22:02   CT CHEST ABDOMEN PELVIS W CONTRAST  Result Date: 12/28/2022 CLINICAL DATA:  Postoperative abdominal pain. EXAM: CT CHEST, ABDOMEN, AND PELVIS WITH CONTRAST TECHNIQUE: Multidetector CT imaging of the chest, abdomen and pelvis was performed following the standard protocol during bolus administration of intravenous contrast. Very limited exam due to body habitus. RADIATION DOSE REDUCTION: This exam was performed according to the departmental dose-optimization program which includes automated exposure control, adjustment of the mA and/or kV according to patient size and/or use of iterative reconstruction technique. CONTRAST:  OMNIPAQUE IOHEXOL 300 MG/ML  SOLN COMPARISON:  August 12, 2022.  October 11, 2015. FINDINGS: CT CHEST FINDINGS Cardiovascular: No significant vascular findings. Normal heart size. No pericardial effusion. Mediastinum/Nodes: No enlarged mediastinal, hilar, or axillary lymph nodes. Thyroid gland, trachea, and esophagus demonstrate no significant  findings. Lungs/Pleura: No pneumothorax or pleural effusion is noted. Right lung is clear. Minimal left basilar subsegmental atelectasis is noted. Musculoskeletal: No chest wall mass or suspicious bone lesions identified. CT ABDOMEN PELVIS FINDINGS Hepatobiliary: Status post cholecystectomy. Surgical drain is seen with tip in gallbladder fossa. No definite biliary dilatation is noted. Visualization of hepatic parenchyma is limited due to body habitus, but no definite abnormality is noted. Pancreas: Not well visualized. Spleen: Normal in size without focal abnormality. Adrenals/Urinary Tract: Right adrenal gland appears normal. 3.6 cm left adrenal nodule is noted most consistent with adenoma. No hydronephrosis or renal obstruction is noted. Urinary bladder is decompressed. Stomach/Bowel: Stomach is unremarkable. There  is no definite evidence of bowel obstruction or inflammation. Appendix is not visualized. Vascular/Lymphatic: No significant vascular findings are present. No enlarged abdominal or pelvic lymph nodes. Reproductive: Prostate is unremarkable. Other: No definite ascites or hernia is noted. Musculoskeletal: No definite fracture or other acute osseous abnormality is noted. IMPRESSION: Significantly limited exam due to soft tissue attenuation artifact secondary to body habitus. Status post cholecystectomy, with surgical drain tip seen in gallbladder fossa. Minimal left basilar subsegmental atelectasis. 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 Electronically Signed   By: Lupita Raider M.D.   On: 12/28/2022 14:46   DG Chest Port 1 View  Result Date: 12/28/2022 CLINICAL DATA:  Sepsis. EXAM: PORTABLE CHEST 1 VIEW COMPARISON:  December 22, 2022. FINDINGS: Stable cardiomegaly with central pulmonary vascular congestion. Minimal bibasilar subsegmental atelectasis is noted. Bony thorax is unremarkable. IMPRESSION: Stable cardiomegaly with central pulmonary vascular congestion. Minimal bibasilar subsegmental  atelectasis. Electronically Signed   By: Lupita Raider M.D.   On: 12/28/2022 13:42    Medications / Allergies: per chart  Antibiotics: Anti-infectives (From admission, onward)    Start     Dose/Rate Route Frequency Ordered Stop   12/29/22 2200  ceFEPIme (MAXIPIME) 2 g in sodium chloride 0.9 % 100 mL IVPB  Status:  Discontinued        2 g 200 mL/hr over 30 Minutes Intravenous Every 12 hours 12/28/22 2127 12/29/22 1401   12/29/22 1500  ceFEPIme (MAXIPIME) 2 g in sodium chloride 0.9 % 100 mL IVPB        2 g 200 mL/hr over 30 Minutes Intravenous 2 times daily 12/29/22 1401     12/29/22 1215  linezolid (ZYVOX) IVPB 600 mg        600 mg 300 mL/hr over 60 Minutes Intravenous Every 12 hours 12/29/22 1120     12/29/22 0400  vancomycin (VANCOREADY) IVPB 1250 mg/250 mL  Status:  Discontinued        1,250 mg 166.7 mL/hr over 90 Minutes Intravenous Every 12 hours 12/28/22 1512 12/28/22 2127   12/28/22 2126  vancomycin variable dose per unstable renal function (pharmacist dosing)  Status:  Discontinued         Does not apply See admin instructions 12/28/22 2127 12/29/22 1120   12/28/22 2100  ceFEPIme (MAXIPIME) 2 g in sodium chloride 0.9 % 100 mL IVPB  Status:  Discontinued        2 g 200 mL/hr over 30 Minutes Intravenous Every 8 hours 12/28/22 1512 12/28/22 2127   12/28/22 2100  metroNIDAZOLE (FLAGYL) IVPB 500 mg        500 mg 100 mL/hr over 60 Minutes Intravenous Every 12 hours 12/28/22 1512     12/28/22 1600  vancomycin (VANCOREADY) IVPB 2000 mg/400 mL        2,000 mg 200 mL/hr over 120 Minutes Intravenous  Once 12/28/22 1512 12/28/22 1752   12/28/22 1230  piperacillin-tazobactam (ZOSYN) IVPB 3.375 g  Status:  Discontinued        3.375 g 12.5 mL/hr over 240 Minutes Intravenous Every 8 hours 12/28/22 1116 12/28/22 1503   12/21/22 0707  ceFAZolin (ANCEF) 3-0.9 GM/100ML-% IVPB       Note to Pharmacy: [facility]: cabinet override      12/21/22 0707 12/21/22 1914   12/21/22 0600   ceFAZolin (ANCEF) IVPB 2g/100 mL premix  Status:  Discontinued        2 g 200 mL/hr over 30 Minutes Intravenous On call to O.R. 12/21/22 0541 12/21/22  1058         Note: Portions of this report may have been transcribed using voice recognition software. Every effort was made to ensure accuracy; however, inadvertent computerized transcription errors may be present.   Any transcriptional errors that result from this process are unintentional.    Ardeth Sportsman, MD, FACS, MASCRS Esophageal, Gastrointestinal & Colorectal Surgery Robotic and Minimally Invasive Surgery  [facility] [address] [place], [place] [address] [phone] Fax [phone] Main  CONTACT INFORMATION: Weekday (9AM-5PM): Call [facility] main office at [phone] Weeknight (5PM-9AM) or Weekend/Holiday: Check EPIC "Web Links" tab & use "AMION" (password " [email or web address]") for General Surgery [facility] coverage  Please, DO NOT use SecureChat  (it is not reliable communication to reach operating surgeons & will lead to a delay in care).   Epic staff messaging available for outptient concerns needing 1-2 business day response.      12/30/2022  10:33 AM

## 2022-12-30 NOTE — Consult Note (Signed)
I have been asked to see the patient by Dr. Vilma Meckel, for evaluation and management of difficult Foley.  History of present illness: 37 year old morbidly obese male in the ICU for biliary sepsis who over the past 24 hours has become hemodynamically unstable and developed renal insufficiency.  As part of his care plan the ICU wanted to monitor his urine output, but given his habitus this proved to be exceedingly difficult.  A Foley catheter was attempted unsuccessfully by the nurses as well as the housestaff.  As such urology was consulted.  The patient was wearing a BiPAP machine, as such a limited review of systems was obtained.   review of systems: A 12 point comprehensive review of systems was obtained and is negative unless otherwise stated in the history of present illness.  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   GI bleed 08/17/2022   Nausea 08/17/2022   GERD (gastroesophageal reflux disease) 08/17/2022   Fall at home, initial encounter 06/27/2022   Leukocytosis 06/27/2022   Peptic ulcer disease 06/27/2022   Left hip pain 06/26/2022   Gout 12/29/2017   Tobacco abuse 12/29/2017   Acute hypoxemic respiratory failure (HCC) 12/29/2017   Bronchitis 12/29/2017   Rectal bleed 07/30/2014   Hiatal hernia    Hemorrhoid    Acute lower GI bleeding 07/29/2014   Abdominal pain    Wrist pain, acute    Tenosynovitis of finger, hand, or wrist    Cellulitis of hand 03/10/2014   Right upper quadrant abdominal pain 02/10/2014   Cholelithiasis 02/10/2014   Polyarthritis 11/08/2012   UTI (lower urinary tract infection) 10/21/2012   Cellulitis 09/17/2012   Tinea pedis 09/17/2012   Erosive esophagitis 09/07/2012   Hypokalemia 09/05/2012   Gastric ulcer  with hemorrhage 09/02/2012   Upper GI bleed 09/02/2012   Acute blood loss anemia 09/02/2012   Hematemesis 09/01/2012   Normocytic anemia 09/01/2012   Acute gouty arthritis 09/01/2012   Super morbid obesity with BMI 80 09/01/2012    No current facility-administered medications on file prior to encounter.   Current Outpatient Medications on File Prior to Encounter  Medication Sig Dispense Refill   acetaminophen (TYLENOL) 325 MG tablet Take 2 tablets (650 mg total) by mouth every 6 (six) hours as needed for mild pain.     allopurinol (ZYLOPRIM) 100 MG tablet Take 1 tablet (100 mg total) by mouth daily.     gabapentin (NEURONTIN) 300 MG capsule Take 1 capsule (300 mg total) by mouth 4 (four) times daily.     hydrocortisone (ANUSOL-HC) 2.5 % rectal cream Place 1 Application rectally 2 (two) times daily.     Lidocaine-Menthol 4-5 % PTCH Apply 1 patch topically daily. Remove after 12 hours     ondansetron (ZOFRAN-ODT) 4 MG disintegrating tablet Take 1 tablet (4 mg total) by mouth every 8 (eight) hours as needed for nausea. 10 tablet 0   pantoprazole (PROTONIX) 40 MG tablet Take 1 tablet (40 mg total) by mouth 2 (two) times daily before a meal.     traMADol (ULTRAM) 50 MG tablet Take 50 mg by mouth 3 (three) times daily as needed for moderate pain.      Past Medical History:  Diagnosis Date   Acute blood loss anemia 09/02/2012   S/p 1 unit  rbcs   Cellulitis    Erosive esophagitis 09/01/2012   NSAID induced.   Gall stones    Gastric ulcer with hemorrhage 09/02/2012   s/p bleeding control tx. Per Dr. Jena Gauss   Gout    Obesity    UTI (lower urinary tract infection)     Past Surgical History:  Procedure Laterality Date   CHOLECYSTECTOMY N/A 12/21/2022   Procedure: LAPAROSCOPIC CHOLECYSTECTOMY;  Surgeon: Kinsinger, De Blanch, MD;  Location: [facility];  Service: General;  Laterality: N/A;   COLONOSCOPY WITH PROPOFOL N/A 07/30/2014   RMR: Colonoscopy anal canal/internal hemorrhoids, otherwise  normal ileocolonoscoopy.    ESOPHAGOGASTRODUODENOSCOPY Left 09/01/2012   FAO:ZHYQMV esophageal erosions consistent with erosive reflux esophagitis/Pre-pyloric benign-appearing gastric ulcer with bleeding stigmata status post bleeding control therapy as described above   ESOPHAGOGASTRODUODENOSCOPY N/A 12/13/2012   HQI:ONGEXB hernia. PReviously noted gastric ulcer completely healed.    ESOPHAGOGASTRODUODENOSCOPY (EGD) WITH PROPOFOL N/A 07/30/2014   Procedure: ESOPHAGOGASTRODUODENOSCOPY (EGD) WITH PROPOFOL;  Surgeon: Corbin Ade, MD;  Location: [facility];  Service: Endoscopy;  Laterality: N/A;   TONSILLECTOMY      Social History   Tobacco Use   Smoking status: Heavy Smoker    Current packs/day: 1.00    Average packs/day: 1 pack/day for 7.0 years (7.0 ttl pk-yrs)    Types: Cigarettes   Smokeless tobacco: Current    Types: Snuff   Tobacco comments:    daily in the evenings\  Substance Use Topics   Alcohol use: No    Alcohol/week: 0.0 standard drinks of alcohol   Drug use: Yes    Types: Marijuana    Comment: if in pain    Family History  Problem Relation Age of Onset   Heart failure Mother    Colon cancer Neg Hx     PE: Vitals:   12/30/22 1200 12/30/22 1215 12/30/22 1230 12/30/22 1245  BP: 112/65 122/75 132/85 128/64  Pulse: (!) 144 (!) 146 (!) 120 63  Resp: 14 14 (!) 30 (!) 34  Temp: 98.1 F (36.7 C)     TempSrc: Axillary     SpO2: 95% 96% 94% 94%  Weight:      Height:       Patient on BiPAP, but awake and alert Atraumatic normocephalic head No cervical or supraclavicular lymphadenopathy appreciated Tolerating BiPAP with some tachypnea Sinus tachycardia Abdomen is morbidly obese, nontender, nondistended, no CVA or suprapubic tenderness Large 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   Recent Labs    12/28/22 2039 12/29/22 0014 12/29/22 0107 12/29/22 0129 12/30/22 0707  NA 133*   < > 133* 134* 134*  K 4.1   < > 4.7 4.6 4.1  CL 100  --  97*  --  99  CO2 19*  --  21*  --  19*  GLUCOSE 112*  --  138*  --  107*  BUN 25*  --  28*  --  44*  CREATININE 2.79*  --  3.08*  --  3.59*  CALCIUM 7.8*  --  7.7*  --  7.4*   < > =  values in this interval not displayed.   Recent Labs    12/28/22 1147 12/30/22 0707  INR 1.2 1.1   No results for input(s): "LABURIN" in the last 72 hours. Results for orders placed or performed during the hospital encounter of 12/21/22  MRSA Next Gen by PCR, Nasal     Status: Abnormal   Collection Time: 12/28/22 11:23 AM   Specimen: Nasal Mucosa; Nasal Swab  Result Value Ref Range Status   MRSA by PCR Next Gen DETECTED (A) NOT DETECTED Final    Comment: RESULT CALLED TO, READ BACK BY AND VERIFIED WITH: B.FOLEY, RN AT 1543 ON 09.12.24 BY N.THOMPSON (NOTE) The GeneXpert MRSA Assay (FDA approved for NASAL specimens only), is one component of a comprehensive MRSA colonization surveillance program. It is not intended to diagnose MRSA infection nor to guide or monitor treatment for MRSA infections. Test performance is not FDA approved in patients less than 7 years old. Performed at [facility], [address] [place], [place] [address]   Culture, blood (x 2)     Status: None (Preliminary result)   Collection Time: 12/28/22 11:47 AM   Specimen: BLOOD RIGHT ARM  Result Value Ref Range Status   Specimen Description   Final    BLOOD RIGHT ARM Performed at [facility], [address] [place], [place] [address]    Special Requests   Final    BOTTLES DRAWN AEROBIC AND ANAEROBIC Blood Culture adequate volume Performed at [facility], [address], [place], [place] [address]    Culture   Final    NO GROWTH 2 DAYS Performed at [facility], [address], [place], [place] [address]    Report Status PENDING  Incomplete  Culture, blood (x 2)     Status: Abnormal   Collection Time: 12/28/22 11:47 AM   Specimen: BLOOD RIGHT HAND  Result Value Ref Range Status   Specimen Description   Final    BLOOD RIGHT HAND Performed at [facility], [address], [place], [place] [address]    Special Requests   Final    BOTTLES DRAWN AEROBIC ONLY Blood Culture adequate volume Performed at [facility], [address] [place], [place] [address]    Culture  Setup Time   Final    GRAM POSITIVE COCCI IN CLUSTERS AEROBIC BOTTLE ONLY CRITICAL RESULT CALLED TO, READ BACK BY AND VERIFIED WITH: PHARMD N.GOOGOVAC AT 1401 ON 12/29/2022 BY T.SAAD.    Culture (A)  Final    STAPHYLOCOCCUS EPIDERMIDIS THE SIGNIFICANCE OF ISOLATING THIS ORGANISM FROM A SINGLE SET OF BLOOD CULTURES WHEN MULTIPLE SETS ARE DRAWN IS UNCERTAIN. PLEASE NOTIFY THE MICROBIOLOGY DEPARTMENT WITHIN ONE WEEK IF SPECIATION AND SENSITIVITIES ARE REQUIRED. Performed at [facility], [address] [place], [place] [address]    Report Status 12/30/2022 FINAL  Final  Blood Culture ID Panel (Reflexed)     Status: Abnormal   Collection Time: 12/28/22 11:47 AM  Result Value Ref Range Status   Enterococcus faecalis NOT DETECTED NOT DETECTED Final   Enterococcus Faecium NOT DETECTED NOT DETECTED Final   Listeria monocytogenes NOT DETECTED NOT DETECTED Final   Staphylococcus species DETECTED (A) NOT DETECTED Final    Comment: CRITICAL RESULT CALLED TO, READ BACK BY AND VERIFIED WITH: PHARMD N.GOOGOVAC AT 1401 ON 12/29/2022 BY T.SAAD.    Staphylococcus aureus (BCID) NOT DETECTED NOT DETECTED Final   Staphylococcus epidermidis DETECTED (A) NOT DETECTED  Final    Comment: CRITICAL RESULT CALLED TO, READ BACK BY AND  VERIFIED WITH: PHARMD N.GOOGOVAC AT 1401 ON 12/29/2022 BY T.SAAD.    Staphylococcus lugdunensis NOT DETECTED NOT DETECTED Final   Streptococcus species NOT DETECTED NOT DETECTED Final   Streptococcus agalactiae NOT DETECTED NOT DETECTED Final   Streptococcus pneumoniae NOT DETECTED NOT DETECTED Final   Streptococcus pyogenes NOT DETECTED NOT DETECTED Final   A.calcoaceticus-baumannii NOT DETECTED NOT DETECTED Final   Bacteroides fragilis NOT DETECTED NOT DETECTED Final   Enterobacterales NOT DETECTED NOT DETECTED Final   Enterobacter cloacae complex NOT DETECTED NOT DETECTED Final   Escherichia coli NOT DETECTED NOT DETECTED Final   Klebsiella aerogenes NOT DETECTED NOT DETECTED Final   Klebsiella oxytoca NOT DETECTED NOT DETECTED Final   Klebsiella pneumoniae NOT DETECTED NOT DETECTED Final   Proteus species NOT DETECTED NOT DETECTED Final   Salmonella species NOT DETECTED NOT DETECTED Final   Serratia marcescens NOT DETECTED NOT DETECTED Final   Haemophilus influenzae NOT DETECTED NOT DETECTED Final   Neisseria meningitidis NOT DETECTED NOT DETECTED Final   Pseudomonas aeruginosa NOT DETECTED NOT DETECTED Final   Stenotrophomonas maltophilia NOT DETECTED NOT DETECTED Final   Candida albicans NOT DETECTED NOT DETECTED Final   Candida auris NOT DETECTED NOT DETECTED Final   Candida glabrata NOT DETECTED NOT DETECTED Final   Candida krusei NOT DETECTED NOT DETECTED Final   Candida parapsilosis NOT DETECTED NOT DETECTED Final   Candida tropicalis NOT DETECTED NOT DETECTED Final   Cryptococcus neoformans/gattii NOT DETECTED NOT DETECTED Final   Methicillin resistance mecA/C NOT DETECTED NOT DETECTED Final    Comment: Performed at [facility], [address] [place], [place] [address]    Imaging: none  Imp: Urinary retention in the setting of biliary sepsis.  Morbidly obese with a buried penis.  Acute renal failure.  Recommendations: Foley catheter was placed using a  cystoscope.  Please see the the procedure note for more details.  The catheter can be removed once the patient's no longer septic and accurate urine output is no longer necessary.   Thank you for involving me in this patient's care, Please page with any further questions or concerns. Crist Fat

## 2022-12-30 NOTE — Progress Notes (Addendum)
eLink Physician-Brief Progress Note Patient Name: [name] DOB: 1986/02/23 MRN: [id number]   Date of Service  12/30/2022  HPI/Events of Note  Patient in atrial fibrillation with RVR.  eICU Interventions  Cardizem gtt ordered.        [name] 12/30/2022, 6:22 AM

## 2022-12-30 NOTE — Progress Notes (Signed)
NAME:  [name], MRN:  [id number], DOB:  [birth year], LOS: 8 ADMISSION DATE:  12/21/2022 CONSULTATION DATE: 12/28/2022 REFERRING MD:  Kinsinger - [facility] CHIEF COMPLAINT:  Septic shock, cholelithiasis  History of Present Illness:  37 year old man who presented to [facility] 9/5 for elective laparoscopic cholecystectomy. PMHx significant for morbid obesity, severe erosive esophagitis c/b hemorrhage (suspected to be NSAID-induced), cholelithiasis with biliary colic, gout, immobility.   Patient initially underwent laparoscopic cholecystectomy with [facility] (Dr. Sheliah Hatch) 9/5. Intraoperative course was notable for only partial cholecystectomy completion due to limited visualization in the setting of hepatomegaly/significant steatosis. EBL , drain placed. Postoperative course was c/b bile leak for which GI was consulted for possible ERCP/stent placement.  On 9/12, patient was noted to have increased WOB, tachypnea and hypotension with subjectively worse abdominal pain. CT Chest/A/P was obtained (limited due to body habitus) but demonstrated postoperative changes, surgical drain in gallbladder fossa, minimal L basilar subsegmental atelectasis, L adrenal adenoma (3.6cm). Patient was transferred to SDU. CXR demonstrated stable cardiomegaly and central pulmonary vascular congestion with minimal bibasilar atelectasis. BiPAP was recommended; however, patient reportedly refused - at that time also refused intubation "even if it means he will die". Made limited DNR.  Concern for worsening clinical status/development of sepsis with Tmax 100.1, tachycardia to 140s, soft BP. Zosyn initiated and TRH consulted for assistance with management. WBC noted to be increased (22K) with AKI (Cr 1.6 from 0.7), hyperbilirubinemia (Tbili 1.8) and lactic acidosis (LA 3.3). Aggressive fluid resuscitation ordered and antibiotics broadened to vanc/cefepime/Flagyl.   Overnight 9/12, patient clinically worsened with persistent hypotension  despite fluids. Code status was changed back to full code. PCCM was consulted for vasopressor initiation and further management of septic shock.   Pertinent Medical History:   Past Medical History:  Diagnosis Date   Acute blood loss anemia 09/02/2012   S/p 1 unit rbcs   Cellulitis    Erosive esophagitis 09/01/2012   NSAID induced.   Gall stones    Gastric ulcer with hemorrhage 09/02/2012   s/p bleeding control tx. Per Dr. Jena Gauss   Gout    Obesity    UTI (lower urinary tract infection)    Significant Hospital Events: Including procedures, antibiotic start and stop dates in addition to other pertinent events   9/5 - Underwent elective lap chole with [facility]. Partial cholecystectomy completed in the setting of poor visualization. EBL . Drain left in place. 9/12 - Transferred to SDU for worsening hypoxia, tachypnea, tachycardia. Hypotensive. CT Chest/A/P with postoperative changes, surgical drain in gallbladder fossa, minimal L basilar subsegmental atelectasis, L adrenal adenoma. TRH consulted. Fluids/Zosyn given, broadened to vanc/cefepime/Flagyl. Refused BiPAP/stated he would refuse intubation even if it meant death. Made DNR. Later in shift more hypotensive, PCCM consulted for pressors. Code status changed back to full code. Vasopressors initiated. 9/13 - A-line, LIJ CVC placed. High pressor requirements with NE 38, Neo 70, vaso 0.03.  Interim History / Subjective:  Bps better weaning pressors but still on 2 Urology trying for foley A line not working Cr worse by a little bit   Objective:  Blood pressure (!) 106/57, pulse (!) 146, temperature 98.1 F (36.7 C), temperature source Oral, resp. rate (!) 21, height 5\' 7"  (1.702 m), weight (!) 235 kg, SpO2 97%. CVP:  [5 mmHg-29 mmHg] 13 mmHg  FiO2 (%):  [35 %] 35 %   Intake/Output Summary (Last 24 hours) at 12/30/2022 1103 Last data filed at 12/29/2022 2320 Gross per 24 hour  Intake 1304.11 ml  Output --  Net 1304.11 ml   Filed  Weights   12/21/22 0543  Weight: (!) 235 kg   Physical Examination: General: Acute-on-chronically ill-appearing morbidly obese man in NAD. HEENT: /AT, anicteric sclera, PERRL, moist mucous membranes. BiPAP mask in place. Neuro:  Awake, though somewhat lethargic. Will intermittently answer questions or shout out from under BiPAP.  Responds to verbal stimuli. Following commands intermittently. Moves all 4 extremities spontaneously. Severe physical deconditioning.   CV: Tachycardic, regular rhythm, no m/g/r. PULM: Breathing even and mildly labored on BiPAP. Lung fields distant, diminished at bilateral bases. GI: Obese, soft, generalized TTP most over upper quadrants, nondistended. Hypoactive bowel sounds. Extremities: Bilateral chronic symmetric LE edema noted. Skin: Warm/dry, +intertrigo to abdominal folds.  Resolved Hospital Problem List:    Assessment & Plan:  Septic shock due to biliary source: Reported bile leak after cholecystectomy.  Too ill for interventions via GI or IR at this time. -- Continue broad-spectrum antibiotics -- Appreciate GI, surgical assistance -- Has received plenty of fluids, continue vasopressors MAP goal greater than 65, wean as able -- Follow-up cultures   Biliary leak: --Will need some intervention if does not respond or resolved spontaneously, too ill at this time -- N.p.o.   Acute hypoxemic respiratory failure: In the setting of aggressive fluid administration due to hypotension/severe sepsis/septic shock. -- To hypotense for diuresis -- BiPAP for now   OHS/OSA: Largely related to habitus, morbid obesity -- BiPAP as above   Acute kidney injury/acute renal failure: In the setting of hypotension sepsis.  Likely ATN -- Vasopressor support as above -- Fluid replete --attempt foley placement with assistance of urology  Best Practice: (right click and "Reselect all SmartList Selections" daily)   Diet/type: NPO DVT prophylaxis: SCDs, Lovenox GI  prophylaxis: PPI Lines: Central line and Arterial Line Foley:  N/A Code Status:  full code Last date of multidisciplinary goals of care discussion [9/13 - D/w sister at bedside, she states that patient had previously told them DNR/DNI; she wishes to respect his wishes to be full code (9/12PM) but would like to revisit GOC conversations if intubation imminent; additionally while discussing renal failure I broached the topic of possible HD and patient shouted "I won't do it".]  Critical care time:    CRITICAL CARE Performed by: Karren Burly  Total critical care time: 38 minutes  Critical care time was exclusive of separately billable procedures and treating other patients.  Critical care was necessary to treat or prevent imminent or life-threatening deterioration.  Critical care was time spent personally by me on the following activities: development of treatment plan with patient and/or surrogate as well as nursing, discussions with consultants, evaluation of patient's response to treatment, examination of patient, obtaining history from patient or surrogate, ordering and performing treatments and interventions, ordering and review of laboratory studies, ordering and review of radiographic studies, pulse oximetry and re-evaluation of patient's condition.   Karren Burly, MD [facility] 12/30/22 11:03 AM  Please see [email or web address] for pager details.  From 7A-7P if no response, please call [phone] After hours, please call ELink [phone]

## 2022-12-30 NOTE — Procedures (Signed)
Preprocedure diagnosis: Acute urinary retention and acute renal failure.  Postprocedure diagnosis: Same  Procedure performed: Cystourethroscopy and Foley catheter placement  Surgeon: Dr. Berniece Salines  Findings: The patient's suprapubic fat pad was enveloping the penis which was buried several inches below the skin.  I was unable to expose it with retraction necessitating cystoscopy.  Once I was able to find the patient's urethra the urethra and bladder appeared otherwise normal.  The bladder was very full.  Drains: A 16 French temperature probe Foley catheter was placed over a wire.  Indications: 37 year old male with biliary sepsis and acute renal failure with a buried penis.  Description: Informed consent was obtained.  The patient's suprapubic fat pad was retracted with 2 nurses.  This exposed the foreskin.  I then was able to place some Betadine in around the penile opening.  I advance the 49 French cystoscope to the patient's urethra meatus and advanced a wire through the urethra.  I then subsequently advanced the scope over the wire to ensure that I was in the patient's urethra and ultimately was able to get into the bladder.  I advanced the wire into the bladder and remove the scope over the wire.  I then was able to pass a 16 French temperature probe Foley over the wire and into the patient's bladder.  Straw-colored urine was obtained.

## 2022-12-31 DIAGNOSIS — K8010 Calculus of gallbladder with chronic cholecystitis without obstruction: Secondary | ICD-10-CM | POA: Diagnosis not present

## 2022-12-31 LAB — CBC
HCT: 33.9 % — ABNORMAL LOW (ref 39.0–52.0)
Hemoglobin: 10.5 g/dL — ABNORMAL LOW (ref 13.0–17.0)
MCH: 28.6 pg (ref 26.0–34.0)
MCHC: 31 g/dL (ref 30.0–36.0)
MCV: 92.4 fL (ref 80.0–100.0)
Platelets: 190 10*3/uL (ref 150–400)
RBC: 3.67 MIL/uL — ABNORMAL LOW (ref 4.22–5.81)
RDW: 14.5 % (ref 11.5–15.5)
WBC: 30.9 10*3/uL — ABNORMAL HIGH (ref 4.0–10.5)
nRBC: 0.1 % (ref 0.0–0.2)

## 2022-12-31 LAB — COMPREHENSIVE METABOLIC PANEL
ALT: 34 U/L (ref 0–44)
AST: 20 U/L (ref 15–41)
Albumin: 2.4 g/dL — ABNORMAL LOW (ref 3.5–5.0)
Alkaline Phosphatase: 105 U/L (ref 38–126)
Anion gap: 13 (ref 5–15)
BUN: 58 mg/dL — ABNORMAL HIGH (ref 6–20)
CO2: 21 mmol/L — ABNORMAL LOW (ref 22–32)
Calcium: 8 mg/dL — ABNORMAL LOW (ref 8.9–10.3)
Chloride: 98 mmol/L (ref 98–111)
Creatinine, Ser: 3.45 mg/dL — ABNORMAL HIGH (ref 0.61–1.24)
GFR, Estimated: 22 mL/min — ABNORMAL LOW (ref >60)
Glucose, Bld: 127 mg/dL — ABNORMAL HIGH (ref 70–99)
Potassium: 4.6 mmol/L (ref 3.5–5.1)
Sodium: 132 mmol/L — ABNORMAL LOW (ref 135–145)
Total Bilirubin: 1 mg/dL (ref 0.3–1.2)
Total Protein: 6.2 g/dL — ABNORMAL LOW (ref 6.5–8.1)

## 2022-12-31 LAB — GLUCOSE, CAPILLARY
Glucose-Capillary: 110 mg/dL — ABNORMAL HIGH (ref 70–99)
Glucose-Capillary: 111 mg/dL — ABNORMAL HIGH (ref 70–99)
Glucose-Capillary: 119 mg/dL — ABNORMAL HIGH (ref 70–99)
Glucose-Capillary: 126 mg/dL — ABNORMAL HIGH (ref 70–99)
Glucose-Capillary: 137 mg/dL — ABNORMAL HIGH (ref 70–99)
Glucose-Capillary: 95 mg/dL (ref 70–99)

## 2022-12-31 MED ORDER — HYDROCORTISONE SOD SUC (PF) 100 MG IJ SOLR
100.0000 mg | Freq: Two times a day (BID) | INTRAMUSCULAR | Status: AC
  Administered 2022-12-31 – 2023-01-01 (×3): 100 mg via INTRAVENOUS
  Filled 2022-12-31 (×4): qty 2

## 2022-12-31 MED ORDER — LINEZOLID 600 MG/300ML IV SOLN
600.0000 mg | Freq: Two times a day (BID) | INTRAVENOUS | Status: DC
  Administered 2022-12-31 – 2023-01-02 (×5): 600 mg via INTRAVENOUS
  Filled 2022-12-31 (×5): qty 300

## 2022-12-31 NOTE — Progress Notes (Signed)
NAME:  [name], MRN:  [id number], DOB:  1985-09-21, LOS: 9 ADMISSION DATE:  12/21/2022 CONSULTATION DATE: 12/28/2022 REFERRING MD:  Kinsinger - [facility] CHIEF COMPLAINT:  Septic shock, cholelithiasis  History of Present Illness:  37 year old man who presented to [facility] 9/5 for elective laparoscopic cholecystectomy. PMHx significant for morbid obesity, severe erosive esophagitis c/b hemorrhage (suspected to be NSAID-induced), cholelithiasis with biliary colic, gout, immobility.   Patient initially underwent laparoscopic cholecystectomy with [facility] (Dr. Sheliah Hatch) 9/5. Intraoperative course was notable for only partial cholecystectomy completion due to limited visualization in the setting of hepatomegaly/significant steatosis. EBL , drain placed. Postoperative course was c/b bile leak for which GI was consulted for possible ERCP/stent placement.  On 9/12, patient was noted to have increased WOB, tachypnea and hypotension with subjectively worse abdominal pain. CT Chest/A/P was obtained (limited due to body habitus) but demonstrated postoperative changes, surgical drain in gallbladder fossa, minimal L basilar subsegmental atelectasis, L adrenal adenoma (3.6cm). Patient was transferred to SDU. CXR demonstrated stable cardiomegaly and central pulmonary vascular congestion with minimal bibasilar atelectasis. BiPAP was recommended; however, patient reportedly refused - at that time also refused intubation "even if it means he will die". Made limited DNR.  Concern for worsening clinical status/development of sepsis with Tmax 100.1, tachycardia to 140s, soft BP. Zosyn initiated and TRH consulted for assistance with management. WBC noted to be increased (22K) with AKI (Cr 1.6 from 0.7), hyperbilirubinemia (Tbili 1.8) and lactic acidosis (LA 3.3). Aggressive fluid resuscitation ordered and antibiotics broadened to vanc/cefepime/Flagyl.   Overnight 9/12, patient clinically worsened with persistent hypotension  despite fluids. Code status was changed back to full code. PCCM was consulted for vasopressor initiation and further management of septic shock.   Pertinent Medical History:   Past Medical History:  Diagnosis Date   Acute blood loss anemia 09/02/2012   S/p 1 unit rbcs   Cellulitis    Erosive esophagitis 09/01/2012   NSAID induced.   Gall stones    Gastric ulcer with hemorrhage 09/02/2012   s/p bleeding control tx. Per Dr. Jena Gauss   Gout    Obesity    UTI (lower urinary tract infection)    Significant Hospital Events: Including procedures, antibiotic start and stop dates in addition to other pertinent events   9/5 - Underwent elective lap chole with [facility]. Partial cholecystectomy completed in the setting of poor visualization. EBL . Drain left in place. 9/12 - Transferred to SDU for worsening hypoxia, tachypnea, tachycardia. Hypotensive. CT Chest/A/P with postoperative changes, surgical drain in gallbladder fossa, minimal L basilar subsegmental atelectasis, L adrenal adenoma. TRH consulted. Fluids/Zosyn given, broadened to vanc/cefepime/Flagyl. Refused BiPAP/stated he would refuse intubation even if it meant death. Made DNR. Later in shift more hypotensive, PCCM consulted for pressors. Code status changed back to full code. Vasopressors initiated. 9/13 - A-line, LIJ CVC placed. High pressor requirements with NE 38, Neo 70, vaso 0.03.  Interim History / Subjective:  Bps better weaning pressors but still on 2 Urology trying for foley A line not working Cr worse by a little bit   Objective:  Blood pressure 131/61, pulse 72, temperature (!) 97.5 F (36.4 C), resp. rate 13, height 5\' 7"  (1.702 m), weight (!) 235 kg, SpO2 96%. CVP:  [14 mmHg] 14 mmHg      Intake/Output Summary (Last 24 hours) at 12/31/2022 0923 Last data filed at 12/31/2022 0830 Gross per 24 hour  Intake 3170.01 ml  Output 1095 ml  Net 2075.01 ml   [employer]  12/21/22 0543  Weight: (!) 235 kg   Physical  Examination: General: Acute-on-chronically ill-appearing morbidly obese man in NAD. HEENT: [place]/AT, anicteric sclera, PERRL, moist mucous membranes. BiPAP mask in place. Neuro:  Awake, though somewhat lethargic. Will intermittently answer questions or shout out from under BiPAP.  Responds to verbal stimuli. Following commands intermittently. Moves all 4 extremities spontaneously. Severe physical deconditioning.   CV: Tachycardic, regular rhythm, no m/g/r. PULM: Breathing even and mildly labored on BiPAP. Lung fields distant, diminished at bilateral bases. GI: Obese, soft, generalized TTP most over upper quadrants, nondistended. Hypoactive bowel sounds. Extremities: Bilateral chronic symmetric LE edema noted. Skin: Warm/dry, +intertrigo to abdominal folds.  Resolved Hospital Problem List:    Assessment & Plan:  Septic shock due to biliary source: Reported bile leak after cholecystectomy.  Too ill for interventions via GI or IR at this time. -- Continue broad-spectrum antibiotics -- Appreciate GI, surgical assistance -- Has received plenty of fluids, continue vasopressors MAP goal greater than 65, wean as able -- Follow-up cultures, 1 of 2 bottles staph epi, suspect contaminant given more likely GI source   Biliary leak: --Will need some intervention if does not respond or resolved spontaneously, too ill at this time -- N.p.o.  A-fib RVR: Brief.  About 24 hours or less.  In the setting of norepinephrine, beta agonism, severe sepsis, bladder distention. --Discontinue amiodarone, no role for anticoagulation given brief duration and clear triggers hopefully being reversed   Acute hypoxemic respiratory failure: In the setting of aggressive fluid administration due to hypotension/severe sepsis/septic shock. -- To hypotense for diuresis -- BiPAP as needed, submental oxygen goal O2 sat 90%   OHS/OSA: Largely related to habitus, morbid obesity -- BiPAP as above   Acute kidney injury/acute renal  failure: In the setting of hypotension sepsis.  Likely ATN and combination of obstructive physiology, unable to pass urine prior to Foley placement.  Having urine output, creatinine starting to downtrend. -- Vasopressor support as above -- Fluid replete -- Status post Foley placement with a cystoscopy and over a wire 9/14 with urology, appreciate assistance  Best Practice: (right click and "Reselect all SmartList Selections" daily)   Diet/type: clear liquids, per surgery DVT prophylaxis: SCDs, Lovenox GI prophylaxis: PPI Lines: Central line and Arterial Line Foley:  N/A Code Status:  full code Last date of multidisciplinary goals of care discussion [9/13 - D/w sister at bedside, she states that patient had previously told them DNR/DNI; she wishes to respect his wishes to be full code (9/12PM) but would like to revisit GOC conversations if intubation imminent; additionally while discussing renal failure I broached the topic of possible HD and patient shouted "I won't do it".]  Critical care time:    CRITICAL CARE Performed by: Karren Burly  Total critical care time: 33 minutes  Critical care time was exclusive of separately billable procedures and treating other patients.  Critical care was necessary to treat or prevent imminent or life-threatening deterioration.  Critical care was time spent personally by me on the following activities: development of treatment plan with patient and/or surrogate as well as nursing, discussions with consultants, evaluation of patient's response to treatment, examination of patient, obtaining history from patient or surrogate, ordering and performing treatments and interventions, ordering and review of laboratory studies, ordering and review of radiographic studies, pulse oximetry and re-evaluation of patient's condition.   Karren Burly, MD [facility] 12/31/22 9:23 AM  Please see [email or web address] for pager details.  From  7A-7P  if no response, please call [phone] After hours, please call ELink [phone]

## 2022-12-31 NOTE — Progress Notes (Signed)
12/31/2022  [name] [id number] 23-Nov-1985  CARE TEAM: PCP: Toma Deiters, MD  Outpatient Care Team: Patient Care Team: Toma Deiters, MD as PCP - General (Internal Medicine) Jena Gauss, Gerrit Friends, MD as Consulting Physician (Gastroenterology)  Inpatient Treatment Team: Treatment Team:  Kinsinger, De Blanch, MD Pccm, Md, MD Barnetta Chapel, MD Sherri Rad, RN Early, Derrel Nip, NT Beatrix Shipper, RN Erlenmeyer, Raymon Mutton, RN   Problem List:   Principal Problem:   Chronic calculous cholecystitis Active Problems:   Super morbid obesity with BMI 80   Gastric ulcer with hemorrhage   Peptic ulcer disease   GERD (gastroesophageal reflux disease)   Chronic cholecystitis   Septic shock (HCC)   AKI (acute kidney injury) (HCC)   Bedridden   Living in nursing home   12/21/2022  Preoperative diagnosis: chronic calculous cholecystitis   Postoperative diagnosis: same    Procedure: laparoscopic partial cholecystectomy   Surgeon: Feliciana Rossetti, M.D.  Findings: large fatty liver, difficult visualization      Assessment [facility] Stay = 9 days) 10 Days Post-Op    Guarded but stabilizing    Plan:   Assessment/Plan   37 yo super morbidly obese bedridden deconditioned male struggling status post lap partial cholecystectomy for chronic calculous cholecystitis.   Evidence of bile in drain consistent with leaking at gallbladder infundibular bed -unsurprising given only able to do subtotal cholecystectomy.    No biloma on imaging.  Discussion by Dr. Sheliah Hatch and Dr. Elnoria Howard about ERCP/stenting but given giant body habitus with BMI of 80, holding off for now.  I suspect he is going to need something stented across his bile duct to get the infundibular stump leak under better control.  Defer to them.  No evidence of elevated lipase nor frank pancreatitis on postop CT scan.  No evidence of bowel injury on CT scan or free air.  Patient with numerous liquid  bowel movements and is thirsty.  Will start with clear liquids today.  N.p.o. after 5 AM in case they wish to reattempt ERCP  Respiratory status improved.  Not needing BiPAP and just on nasal cannula.  Per pulmonary to rule out any PE/pneumonia   Tachycardia with irregular rate consistent with atrial fibrillation.  On diltiazem and amiodarone.  Question about proceeding with anticoagulation.  Okay to do from surgery standpoint -would hold if procedures planned.  FEN -try clear liquid diet.  He is actually thirsty for cranberry juice this morning.  Foley catheter placed for urinary retention yesterday Dr. Marlou Porch using bedside cystoscopy given his severe morbid obesity/body habitus. .  Creatinine still elevated but not worse.  e and CT cannot successfully catheterize due to   VTE - Question about proceeding with anticoagulation.  Okay to do from surgery standpoint -would hold if procedures planned.  ID -sepsis protocol = vancomycin/cefepime per critical care.  Clinically looks better but still on 2 pressors and with significant leukocytosis.  Guardedly hopeful but still concerning  Chronically bedridden with super morbid obesity living at skilled nursing facility and very deconditioned.  Makes recovery challenging as well.  Skin care precautions         I reviewed nursing notes, Consultant CCM notes, last 24 h vitals and pain scores, last 48 h intake and output, last 24 h labs and trends, and last 24 h imaging results.  I have reviewed this patient's available data, including medical history, events of note, test results, etc as part of my evaluation.   A significant  portion of that time was spent in counseling. Care during the described time interval was provided by me.  This care required high  level of medical decision making.  12/31/2022    Subjective: (Chief complaint)  Patient much more alert today.  Does not want to eat food but thirsty for cranberry juice and other juices.   No nausea or vomiting.  Loose bowel movements yesterday leaking around Flexi-Seal.  Foley catheter finally able to be placed with bedside urology cystoscopy by Dr. Marlou Porch.  Patient with some upper abdominal pain but improved.      Objective:  Vital signs:  Vitals:   12/31/22 0615 12/31/22 0630 12/31/22 0645 12/31/22 0700  BP: (!) 108/59 (!) 111/51 (!) 120/55 (!) 114/59  Pulse: 78 84 77 74  Resp: 14 16 15 14   Temp: 97.7 F (36.5 C) 97.7 F (36.5 C) 97.7 F (36.5 C) 97.7 F (36.5 C)  TempSrc:      SpO2: 97% 99% 98% 98%  Weight:      Height:        Last BM Date : 12/30/22  Intake/Output   Yesterday:  09/14 0701 - 09/15 0700 In: 3197.4 [I.V.:1074.1; IV Piggyback:2123.3] Out: 1095 [Urine:1050; Drains:45] This shift:  No intake/output data recorded.  Bowel function:  Flatus: YES  BM:  YES  Drain:  Thinly serous/bilious   Physical Exam:  General: Pt awake/alert in no acute distress.  Alert and talkative. Eyes: PERRL, normal EOM.  Sclera clear.  No icterus Neuro: CN II-XII intact w/o focal sensory/motor deficits. Lymph: No head/neck/groin lymphadenopathy Psych:  No delerium/psychosis/paranoia.  Oriented x 4 HENT: Normocephalic, Mucus membranes moist.  No thrush Neck: Supple, No tracheal deviation.  No obvious thyromegaly Chest: No pain to chest wall compression.  Good respiratory excursion.  No audible wheezing.  No nasal cannula. CV:  Pulses intact.  Regular rhythm.  No major extremity edema MS: Normal AROM mjr joints.  No obvious deformity  Abdomen: Superobese with panniculus.  Drain right upper quadrant bilious.  Somewhat firm. -I suspect that is his baseline given his superobesity.  Nondistended.  Tenderness at drain site and epigastric mild.  No severe peritonitis.  Left side of abdomen and lower abdomen nontender. .  No evidence of peritonitis.  No incarcerated hernias.  Ext:   No deformity.  No mjr edema.  No cyanosis Skin: No petechiae / purpurea.  No  major sores.  Warm and dry    Results:   Cultures: Recent Results (from the past 720 hour(s))  MRSA Next Gen by PCR, Nasal     Status: Abnormal   Collection Time: 12/28/22 11:23 AM   Specimen: Nasal Mucosa; Nasal Swab  Result Value Ref Range Status   MRSA by PCR Next Gen DETECTED (A) NOT DETECTED Final    Comment: RESULT CALLED TO, READ BACK BY AND VERIFIED WITH: B.FOLEY, RN AT 1543 ON 09.12.24 BY N.THOMPSON (NOTE) The GeneXpert MRSA Assay (FDA approved for NASAL specimens only), is one component of a comprehensive MRSA colonization surveillance program. It is not intended to diagnose MRSA infection nor to guide or monitor treatment for MRSA infections. Test performance is not FDA approved in patients less than 32 years old. Performed at [facility], [address], [place], [place] [address]   Culture, blood (x 2)     Status: None (Preliminary result)   Collection Time: 12/28/22 11:47 AM   Specimen: BLOOD RIGHT ARM  Result Value Ref Range Status   Specimen Description  Final    BLOOD RIGHT ARM Performed at [facility], [address], [place], [place] [address]    Special Requests   Final    BOTTLES DRAWN AEROBIC AND ANAEROBIC Blood Culture adequate volume Performed at [facility], [address] [place], [place] [address]    Culture   Final    NO GROWTH 2 DAYS Performed at [facility], [address], [place], [place] [address]    Report Status PENDING  Incomplete  Culture, blood (x 2)     Status: Abnormal   Collection Time: 12/28/22 11:47 AM   Specimen: BLOOD RIGHT HAND  Result Value Ref Range Status   Specimen Description   Final    BLOOD RIGHT HAND Performed at [facility], [address], [place], [place] [address]    Special Requests   Final    BOTTLES DRAWN AEROBIC ONLY Blood Culture adequate volume Performed at [facility], [address], [place], [place] [address]     Culture  Setup Time   Final    GRAM POSITIVE COCCI IN CLUSTERS AEROBIC BOTTLE ONLY CRITICAL RESULT CALLED TO, READ BACK BY AND VERIFIED WITH: PHARMD N.GOOGOVAC AT 1401 ON 12/29/2022 BY T.SAAD.    Culture (A)  Final    STAPHYLOCOCCUS EPIDERMIDIS THE SIGNIFICANCE OF ISOLATING THIS ORGANISM FROM A SINGLE SET OF BLOOD CULTURES WHEN MULTIPLE SETS ARE DRAWN IS UNCERTAIN. PLEASE NOTIFY THE MICROBIOLOGY DEPARTMENT WITHIN ONE WEEK IF SPECIATION AND SENSITIVITIES ARE REQUIRED. Performed at [facility], [address] [place], [place] [address]    Report Status 12/30/2022 FINAL  Final  Blood Culture ID Panel (Reflexed)     Status: Abnormal   Collection Time: 12/28/22 11:47 AM  Result Value Ref Range Status   Enterococcus faecalis NOT DETECTED NOT DETECTED Final   Enterococcus Faecium NOT DETECTED NOT DETECTED Final   Listeria monocytogenes NOT DETECTED NOT DETECTED Final   Staphylococcus species DETECTED (A) NOT DETECTED Final    Comment: CRITICAL RESULT CALLED TO, READ BACK BY AND VERIFIED WITH: PHARMD N.GOOGOVAC AT 1401 ON 12/29/2022 BY T.SAAD.    Staphylococcus aureus (BCID) NOT DETECTED NOT DETECTED Final   Staphylococcus epidermidis DETECTED (A) NOT DETECTED Final    Comment: CRITICAL RESULT CALLED TO, READ BACK BY AND VERIFIED WITH: PHARMD N.GOOGOVAC AT 1401 ON 12/29/2022 BY T.SAAD.    Staphylococcus lugdunensis NOT DETECTED NOT DETECTED Final   Streptococcus species NOT DETECTED NOT DETECTED Final   Streptococcus agalactiae NOT DETECTED NOT DETECTED Final   Streptococcus pneumoniae NOT DETECTED NOT DETECTED Final   Streptococcus pyogenes NOT DETECTED NOT DETECTED Final   A.calcoaceticus-baumannii NOT DETECTED NOT DETECTED Final   Bacteroides fragilis NOT DETECTED NOT DETECTED Final   Enterobacterales NOT DETECTED NOT DETECTED Final   Enterobacter cloacae complex NOT DETECTED NOT DETECTED Final   Escherichia coli NOT DETECTED NOT DETECTED Final   Klebsiella aerogenes NOT  DETECTED NOT DETECTED Final   Klebsiella oxytoca NOT DETECTED NOT DETECTED Final   Klebsiella pneumoniae NOT DETECTED NOT DETECTED Final   Proteus species NOT DETECTED NOT DETECTED Final   Salmonella species NOT DETECTED NOT DETECTED Final   Serratia marcescens NOT DETECTED NOT DETECTED Final   Haemophilus influenzae NOT DETECTED NOT DETECTED Final   Neisseria meningitidis NOT DETECTED NOT DETECTED Final   Pseudomonas aeruginosa NOT DETECTED NOT DETECTED Final   Stenotrophomonas maltophilia NOT DETECTED NOT DETECTED Final   Candida albicans NOT DETECTED NOT DETECTED Final  Candida auris NOT DETECTED NOT DETECTED Final   Candida glabrata NOT DETECTED NOT DETECTED Final   Candida krusei NOT DETECTED NOT DETECTED Final   Candida parapsilosis NOT DETECTED NOT DETECTED Final   Candida tropicalis NOT DETECTED NOT DETECTED Final   Cryptococcus neoformans/gattii NOT DETECTED NOT DETECTED Final   Methicillin resistance mecA/C NOT DETECTED NOT DETECTED Final    Comment: Performed at [facility], [address], [place], [place] [address]    Labs: Results for orders placed or performed during the hospital encounter of 12/21/22 (from the past 48 hour(s))  Glucose, capillary     Status: Abnormal   Collection Time: 12/29/22  8:06 AM  Result Value Ref Range   Glucose-Capillary 178 (H) 70 - 99 mg/dL    Comment: Glucose reference range applies only to samples taken after fasting for at least 8 hours.   Comment 1 Notify RN    Comment 2 Document in Chart   Glucose, capillary     Status: Abnormal   Collection Time: 12/29/22 11:36 AM  Result Value Ref Range   Glucose-Capillary 153 (H) 70 - 99 mg/dL    Comment: Glucose reference range applies only to samples taken after fasting for at least 8 hours.  Glucose, capillary     Status: Abnormal   Collection Time: 12/29/22  4:25 PM  Result Value Ref Range   Glucose-Capillary 128 (H) 70 - 99 mg/dL    Comment: Glucose reference range applies only to  samples taken after fasting for at least 8 hours.   Comment 1 Notify RN    Comment 2 Document in Chart   Glucose, capillary     Status: Abnormal   Collection Time: 12/29/22  7:51 PM  Result Value Ref Range   Glucose-Capillary 115 (H) 70 - 99 mg/dL    Comment: Glucose reference range applies only to samples taken after fasting for at least 8 hours.  Glucose, capillary     Status: Abnormal   Collection Time: 12/29/22 10:09 PM  Result Value Ref Range   Glucose-Capillary 105 (H) 70 - 99 mg/dL    Comment: Glucose reference range applies only to samples taken after fasting for at least 8 hours.   Comment 1 Notify RN    Comment 2 Document in Chart   CBC     Status: Abnormal   Collection Time: 12/30/22  7:07 AM  Result Value Ref Range   WBC 25.7 (H) 4.0 - 10.5 K/uL   RBC 3.62 (L) 4.22 - 5.81 MIL/uL   Hemoglobin 10.3 (L) 13.0 - 17.0 g/dL   HCT 62.9 (L) 52.8 - 41.3 %   MCV 91.7 80.0 - 100.0 fL   MCH 28.5 26.0 - 34.0 pg   MCHC 31.0 30.0 - 36.0 g/dL   RDW 24.4 01.0 - 27.2 %   Platelets 203 150 - 400 K/uL   nRBC 0.1 0.0 - 0.2 %    Comment: Performed at [facility], [address] [place], [place] [address]  Comprehensive metabolic panel     Status: Abnormal   Collection Time: 12/30/22  7:07 AM  Result Value Ref Range   Sodium 134 (L) 135 - 145 mmol/L   Potassium 4.1 3.5 - 5.1 mmol/L   Chloride 99 98 - 111 mmol/L   CO2 19 (L) 22 - 32 mmol/L   Glucose, Bld 107 (H) 70 - 99 mg/dL    Comment: Glucose reference range applies only to samples taken after fasting for at least  8 hours.   BUN 44 (H) 6 - 20 mg/dL   Creatinine, Ser 9.60 (H) 0.61 - 1.24 mg/dL   Calcium 7.4 (L) 8.9 - 10.3 mg/dL   Total Protein 5.6 (L) 6.5 - 8.1 g/dL   Albumin 2.2 (L) 3.5 - 5.0 g/dL   AST 22 15 - 41 U/L   ALT 32 0 - 44 U/L   Alkaline Phosphatase 79 38 - 126 U/L   Total Bilirubin 0.9 0.3 - 1.2 mg/dL   GFR, Estimated 21 (L) >60 mL/min    Comment: (NOTE) Calculated using the CKD-EPI Creatinine  Equation (2021)    Anion gap 16 (H) 5 - 15    Comment: Performed at [facility], [address] [place], [place] [address]  Magnesium     Status: None   Collection Time: 12/30/22  7:07 AM  Result Value Ref Range   Magnesium 1.8 1.7 - 2.4 mg/dL    Comment: Performed at [facility], [address], [place], [place] [address]  Phosphorus     Status: Abnormal   Collection Time: 12/30/22  7:07 AM  Result Value Ref Range   Phosphorus 4.8 (H) 2.5 - 4.6 mg/dL    Comment: Performed at [facility], [address], [place], [place] [address]  Protime-INR     Status: None   Collection Time: 12/30/22  7:07 AM  Result Value Ref Range   Prothrombin Time 14.2 11.4 - 15.2 seconds   INR 1.1 0.8 - 1.2    Comment: (NOTE) INR goal varies based on device and disease states. Performed at [facility], [address] [place], [place] [address]   Glucose, capillary     Status: None   Collection Time: 12/30/22  7:48 AM  Result Value Ref Range   Glucose-Capillary 71 70 - 99 mg/dL    Comment: Glucose reference range applies only to samples taken after fasting for at least 8 hours.   Comment 1 Notify RN    Comment 2 Document in Chart   Lipase, blood     Status: None   Collection Time: 12/30/22  9:38 AM  Result Value Ref Range   Lipase 14 11 - 51 U/L    Comment: Performed at [facility], [address] [place], [place] [address]  Glucose, capillary     Status: Abnormal   Collection Time: 12/30/22 12:08 PM  Result Value Ref Range   Glucose-Capillary 133 (H) 70 - 99 mg/dL    Comment: Glucose reference range applies only to samples taken after fasting for at least 8 hours.   Comment 1 Notify RN    Comment 2 Document in Chart   Glucose, capillary     Status: Abnormal   Collection Time: 12/30/22  3:26 PM  Result Value Ref Range   Glucose-Capillary 137 (H) 70 - 99 mg/dL    Comment: Glucose reference range  applies only to samples taken after fasting for at least 8 hours.   Comment 1 Notify RN    Comment 2 Document in Chart   Prealbumin     Status: Abnormal   Collection Time: 12/30/22  4:26 PM  Result Value Ref Range   Prealbumin 5 (L) 18 - 38 mg/dL    Comment: Performed at [facility], [address] [place], [place] [address]  Glucose, capillary     Status: Abnormal   Collection Time: 12/30/22  7:58 PM  Result Value Ref Range  Glucose-Capillary 122 (H) 70 - 99 mg/dL    Comment: Glucose reference range applies only to samples taken after fasting for at least 8 hours.  Glucose, capillary     Status: Abnormal   Collection Time: 12/30/22 11:16 PM  Result Value Ref Range   Glucose-Capillary 129 (H) 70 - 99 mg/dL    Comment: Glucose reference range applies only to samples taken after fasting for at least 8 hours.  Glucose, capillary     Status: Abnormal   Collection Time: 12/31/22  3:50 AM  Result Value Ref Range   Glucose-Capillary 111 (H) 70 - 99 mg/dL    Comment: Glucose reference range applies only to samples taken after fasting for at least 8 hours.  CBC     Status: Abnormal   Collection Time: 12/31/22  6:18 AM  Result Value Ref Range   WBC 30.9 (H) 4.0 - 10.5 K/uL   RBC 3.67 (L) 4.22 - 5.81 MIL/uL   Hemoglobin 10.5 (L) 13.0 - 17.0 g/dL   HCT 13.0 (L) 86.5 - 78.4 %   MCV 92.4 80.0 - 100.0 fL   MCH 28.6 26.0 - 34.0 pg   MCHC 31.0 30.0 - 36.0 g/dL   RDW 69.6 29.5 - 28.4 %   Platelets 190 150 - 400 K/uL   nRBC 0.1 0.0 - 0.2 %    Comment: Performed at [facility], [address], [place], [place] [address]  Comprehensive metabolic panel     Status: Abnormal   Collection Time: 12/31/22  6:18 AM  Result Value Ref Range   Sodium 132 (L) 135 - 145 mmol/L   Potassium 4.6 3.5 - 5.1 mmol/L   Chloride 98 98 - 111 mmol/L   CO2 21 (L) 22 - 32 mmol/L   Glucose, Bld 127 (H) 70 - 99 mg/dL    Comment: Glucose reference range applies only to samples taken after  fasting for at least 8 hours.   BUN 58 (H) 6 - 20 mg/dL   Creatinine, Ser 0.10 (H) 0.61 - 1.24 mg/dL   Calcium 8.0 (L) 8.9 - 10.3 mg/dL   Total Protein 6.2 (L) 6.5 - 8.1 g/dL   Albumin 2.4 (L) 3.5 - 5.0 g/dL   AST 20 15 - 41 U/L   ALT 34 0 - 44 U/L   Alkaline Phosphatase 105 38 - 126 U/L   Total Bilirubin 1.0 0.3 - 1.2 mg/dL   GFR, Estimated 22 (L) >60 mL/min    Comment: (NOTE) Calculated using the CKD-EPI Creatinine Equation (2021)    Anion gap 13 5 - 15    Comment: Performed at [facility], [address], [place], [place] [address]    Imaging / Studies: No results found.  Medications / Allergies: per chart  Antibiotics: Anti-infectives (From admission, onward)    Start     Dose/Rate Route Frequency Ordered Stop   12/29/22 2200  ceFEPIme (MAXIPIME) 2 g in sodium chloride 0.9 % 100 mL IVPB  Status:  Discontinued        2 g 200 mL/hr over 30 Minutes Intravenous Every 12 hours 12/28/22 2127 12/29/22 1401   12/29/22 1500  ceFEPIme (MAXIPIME) 2 g in sodium chloride 0.9 % 100 mL IVPB        2 g 200 mL/hr over 30 Minutes Intravenous 2 times daily 12/29/22 1401     12/29/22 1215  linezolid (ZYVOX) IVPB 600 mg        600 mg 300 mL/hr over 60  Minutes Intravenous Every 12 hours 12/29/22 1120     12/29/22 0400  vancomycin (VANCOREADY) IVPB 1250 mg/250 mL  Status:  Discontinued        1,250 mg 166.7 mL/hr over 90 Minutes Intravenous Every 12 hours 12/28/22 1512 12/28/22 2127   12/28/22 2126  vancomycin variable dose per unstable renal function (pharmacist dosing)  Status:  Discontinued         Does not apply See admin instructions 12/28/22 2127 12/29/22 1120   12/28/22 2100  ceFEPIme (MAXIPIME) 2 g in sodium chloride 0.9 % 100 mL IVPB  Status:  Discontinued        2 g 200 mL/hr over 30 Minutes Intravenous Every 8 hours 12/28/22 1512 12/28/22 2127   12/28/22 2100  metroNIDAZOLE (FLAGYL) IVPB 500 mg        500 mg 100 mL/hr over 60 Minutes Intravenous Every 12  hours 12/28/22 1512     12/28/22 1600  vancomycin (VANCOREADY) IVPB 2000 mg/400 mL        2,000 mg 200 mL/hr over 120 Minutes Intravenous  Once 12/28/22 1512 12/28/22 1752   12/28/22 1230  piperacillin-tazobactam (ZOSYN) IVPB 3.375 g  Status:  Discontinued        3.375 g 12.5 mL/hr over 240 Minutes Intravenous Every 8 hours 12/28/22 1116 12/28/22 1503   12/21/22 0707  ceFAZolin (ANCEF) 3-0.9 GM/100ML-% IVPB       Note to Pharmacy: [facility]: cabinet override      12/21/22 0707 12/21/22 1914   12/21/22 0600  ceFAZolin (ANCEF) IVPB 2g/100 mL premix  Status:  Discontinued        2 g 200 mL/hr over 30 Minutes Intravenous On call to O.R. 12/21/22 0541 12/21/22 1058         Note: Portions of this report may have been transcribed using voice recognition software. Every effort was made to ensure accuracy; however, inadvertent computerized transcription errors may be present.   Any transcriptional errors that result from this process are unintentional.    Ardeth Sportsman, MD, FACS, MASCRS Esophageal, Gastrointestinal & Colorectal Surgery Robotic and Minimally Invasive Surgery  [facility] [address] [place], [place] [address] [phone] Fax [phone] Main  CONTACT INFORMATION: Weekday (9AM-5PM): Call [facility] main office at [phone] Weeknight (5PM-9AM) or Weekend/Holiday: Check EPIC "Web Links" tab & use "AMION" (password " [email or web address]") for General Surgery [facility] coverage  Please, DO NOT use SecureChat  (it is not reliable communication to reach operating surgeons & will lead to a delay in care).   Epic staff messaging available for outptient concerns needing 1-2 business day response.      12/31/2022  7:23 AM

## 2022-12-31 NOTE — Progress Notes (Signed)
   12/31/22 2338  BiPAP/CPAP/SIPAP  BiPAP/CPAP/SIPAP Pt Type Adult  BiPAP/CPAP/SIPAP V60  Reason BIPAP/CPAP not in use Non-compliant (PT refused)

## 2022-12-31 NOTE — Plan of Care (Signed)

## 2023-01-01 DIAGNOSIS — E871 Hypo-osmolality and hyponatremia: Secondary | ICD-10-CM

## 2023-01-01 DIAGNOSIS — J9601 Acute respiratory failure with hypoxia: Secondary | ICD-10-CM

## 2023-01-01 DIAGNOSIS — N179 Acute kidney failure, unspecified: Secondary | ICD-10-CM | POA: Diagnosis not present

## 2023-01-01 LAB — CBC
HCT: 33.9 % — ABNORMAL LOW (ref 39.0–52.0)
Hemoglobin: 10.3 g/dL — ABNORMAL LOW (ref 13.0–17.0)
MCH: 27.9 pg (ref 26.0–34.0)
MCHC: 30.4 g/dL (ref 30.0–36.0)
MCV: 91.9 fL (ref 80.0–100.0)
Platelets: 169 10*3/uL (ref 150–400)
RBC: 3.69 MIL/uL — ABNORMAL LOW (ref 4.22–5.81)
RDW: 14.6 % (ref 11.5–15.5)
WBC: 25 10*3/uL — ABNORMAL HIGH (ref 4.0–10.5)
nRBC: 0 % (ref 0.0–0.2)

## 2023-01-01 LAB — COMPREHENSIVE METABOLIC PANEL
ALT: 34 U/L (ref 0–44)
AST: 17 U/L (ref 15–41)
Albumin: 2.3 g/dL — ABNORMAL LOW (ref 3.5–5.0)
Alkaline Phosphatase: 110 U/L (ref 38–126)
Anion gap: 14 (ref 5–15)
BUN: 64 mg/dL — ABNORMAL HIGH (ref 6–20)
CO2: 22 mmol/L (ref 22–32)
Calcium: 8.2 mg/dL — ABNORMAL LOW (ref 8.9–10.3)
Chloride: 98 mmol/L (ref 98–111)
Creatinine, Ser: 2.39 mg/dL — ABNORMAL HIGH (ref 0.61–1.24)
GFR, Estimated: 35 mL/min — ABNORMAL LOW (ref >60)
Glucose, Bld: 101 mg/dL — ABNORMAL HIGH (ref 70–99)
Potassium: 3.8 mmol/L (ref 3.5–5.1)
Sodium: 134 mmol/L — ABNORMAL LOW (ref 135–145)
Total Bilirubin: 0.7 mg/dL (ref 0.3–1.2)
Total Protein: 6.2 g/dL — ABNORMAL LOW (ref 6.5–8.1)

## 2023-01-01 LAB — GLUCOSE, CAPILLARY
Glucose-Capillary: 96 mg/dL (ref 70–99)
Glucose-Capillary: 96 mg/dL (ref 70–99)
Glucose-Capillary: 107 mg/dL — ABNORMAL HIGH (ref 70–99)
Glucose-Capillary: 108 mg/dL — ABNORMAL HIGH (ref 70–99)
Glucose-Capillary: 84 mg/dL (ref 70–99)
Glucose-Capillary: 88 mg/dL (ref 70–99)

## 2023-01-01 MED ORDER — SODIUM CHLORIDE 0.9 % IV SOLN
2.0000 g | Freq: Three times a day (TID) | INTRAVENOUS | Status: DC
  Administered 2023-01-01 – 2023-01-12 (×32): 2 g via INTRAVENOUS
  Filled 2023-01-01 (×33): qty 12.5

## 2023-01-01 MED ORDER — SODIUM CHLORIDE 0.9 % IV SOLN: INTRAVENOUS | Status: DC | PRN

## 2023-01-01 MED ORDER — IPRATROPIUM-ALBUTEROL 0.5-2.5 (3) MG/3ML IN SOLN
3.0000 mL | Freq: Once | RESPIRATORY_TRACT | Status: AC
  Administered 2023-01-01: 3 mL via RESPIRATORY_TRACT
  Filled 2023-01-01: qty 3

## 2023-01-01 MED ORDER — FUROSEMIDE 10 MG/ML IJ SOLN
40.0000 mg | Freq: Once | INTRAMUSCULAR | Status: AC
  Administered 2023-01-01: 40 mg via INTRAVENOUS
  Filled 2023-01-01: qty 4

## 2023-01-01 NOTE — Progress Notes (Signed)
NAME:  [name], MRN:  [id number], DOB:  03-Mar-1986, LOS: 10 ADMISSION DATE:  12/21/2022 CONSULTATION DATE: 12/28/2022 REFERRING MD:  Sheliah Hatch - [facility] CHIEF COMPLAINT:  Septic shock, cholelithiasis  History of Present Illness:  37 year old man who presented to [facility] 9/5 for elective laparoscopic cholecystectomy. PMHx significant for morbid obesity, severe erosive esophagitis c/b hemorrhage (suspected to be NSAID-induced), cholelithiasis with biliary colic, gout, immobility.   Patient initially underwent laparoscopic cholecystectomy with [facility] (Dr. Sheliah Hatch) 9/5. Intraoperative course was notable for only partial cholecystectomy completion due to limited visualization in the setting of hepatomegaly/significant steatosis. EBL , drain placed. Postoperative course was c/b bile leak for which GI was consulted for possible ERCP/stent placement.  On 9/12, patient was noted to have increased WOB, tachypnea and hypotension with subjectively worse abdominal pain. CT Chest/A/P was obtained (limited due to body habitus) but demonstrated postoperative changes, surgical drain in gallbladder fossa, minimal L basilar subsegmental atelectasis, L adrenal adenoma (3.6cm). Patient was transferred to SDU. CXR demonstrated stable cardiomegaly and central pulmonary vascular congestion with minimal bibasilar atelectasis. BiPAP was recommended; however, patient reportedly refused - at that time also refused intubation "even if it means he will die". Made limited DNR.  Concern for worsening clinical status/development of sepsis with Tmax 100.1, tachycardia to 140s, soft BP. Zosyn initiated and TRH consulted for assistance with management. WBC noted to be increased (22K) with AKI (Cr 1.6 from 0.7), hyperbilirubinemia (Tbili 1.8) and lactic acidosis (LA 3.3). Aggressive fluid resuscitation ordered and antibiotics broadened to vanc/cefepime/Flagyl.   Overnight 9/12, patient clinically worsened with persistent hypotension  despite fluids. Code status was changed back to full code. PCCM was consulted for vasopressor initiation and further management of septic shock.   Pertinent Medical History:   Past Medical History:  Diagnosis Date   Acute blood loss anemia 09/02/2012   S/p 1 unit rbcs   Cellulitis    Erosive esophagitis 09/01/2012   NSAID induced.   Gall stones    Gastric ulcer with hemorrhage 09/02/2012   s/p bleeding control tx. Per Dr. Jena Gauss   Gout    Obesity    UTI (lower urinary tract infection)    Significant Hospital Events: Including procedures, antibiotic start and stop dates in addition to other pertinent events   9/5 - Underwent elective lap chole with [facility]CS. Partial cholecystectomy completed in the setting of poor visualization. EBL . Drain left in place. 9/12 - Transferred to SDU for worsening hypoxia, tachypnea, tachycardia. Hypotensive. CT Chest/A/P with postoperative changes, surgical drain in gallbladder fossa, minimal L basilar subsegmental atelectasis, L adrenal adenoma. TRH consulted. Fluids/Zosyn given, broadened to vanc/cefepime/Flagyl. Refused BiPAP/stated he would refuse intubation even if it meant death. Made DNR. Later in shift more hypotensive, PCCM consulted for pressors. Code status changed back to full code. Vasopressors initiated. 9/13 - A-line, LIJ CVC placed. High pressor requirements with NE 38, Neo 70, vaso 0.03. 9/16: neo stopped 9/13. NE and vaso stopped 9/15. Duoneb for wheezing. A-line removed. Transfer to SDU.   Interim History / Subjective:  Sleeping but awakens to voice on exam. No complaints this morning. No acute events overnight although refusing NIV.   Objective:  Blood pressure (!) 151/67, pulse 67, temperature (!) 96.8 F (36 C), temperature source Bladder, resp. rate 12, height 5\' 7"  (1.702 m), weight (!) 235 kg, SpO2 97%.        Intake/Output Summary (Last 24 hours) at 01/01/2023 1048 Last data filed at 01/01/2023 1030 Gross per 24 hour  Intake  1316.94  ml  Output 2900 ml  Net -1583.06 ml   Filed Weights   12/21/22 0543  Weight: (!) 235 kg   Physical Examination: General: Acute-on-chronically ill-appearing morbidly obese man in NAD, sleeping but easily awakens on exam  HEENT: anicteric sclera, PERRLA, moist mucous membranes. On nasal cannula  Neuro: Awakens to verbal stimuli. On 8L [place]. Moves all extremities. Severe physical deconditioning.   CV: S1/S2 without murmur, rub, gallop PULM: Breathing even and mildly labored on [place]. Wheezing bilaterally. Diminished in the bases likely 2/2 habitus GI: Obese, soft, generalized TTP most over upper quadrants, nondistended. Hypoactive bowel sounds. Extremities: Bilateral chronic symmetric LE edema noted. Skin: Warm/dry, +intertrigo to abdominal folds.  Resolved Hospital Problem List:  Shock Atrial fibrillation RVR Assessment & Plan:  Sepsis, likely biliary source: Reported bile leak after cholecystectomy.  Too ill for interventions via GI or IR at this time. -- Continue broad-spectrum antibiotics with cefepime, linezolid, flagyl  -- Appreciate GI, surgical assistance -- pressors off  -- Follow-up cultures, 1 of 2 bottles staph epi, suspect contaminant given more likely GI source   Biliary leak: post-operative  - biliary drain in place, still draining small amount of fluid  - Will need some intervention if does not respond or resolved spontaneously, too ill at this time - NPO for possible ERCP today.   Acute on chronic hypoxemia respiratory failure: Likely related to aggressive fluid resuscitation during shock.  - auto-diuresing, still 19L+ on admission.  - consider Lasix now that he is more clinically stable  - on 8L [place], wean to goal sat 90%  - NIV PRN  OHS/OSA: Largely related to habitus, morbid obesity -- BiPAP as above   Acute kidney injury/acute renal failure: In the setting of hypotension sepsis.  Likely ATN and combination of obstructive physiology, unable to pass urine  prior to Foley placement.  Having urine output, creatinine starting to downtrend. - Status post Foley placement with a cystoscopy and over a wire 9/14 with urology, appreciate assistance - trend BMP - avoid nephrotoxic agents as able   Best Practice: (right click and "Reselect all SmartList Selections" daily)   Diet/type: NPO w/ oral meds, per surgery DVT prophylaxis: systemic heparin GI prophylaxis: PPI Lines: Central line and Arterial Line Foley:  Yes, and it is still needed Code Status:  full code Last date of multidisciplinary goals of care discussion [9/13 - D/w sister at bedside, she states that patient had previously told them DNR/DNI; she wishes to respect his wishes to be full code (9/12PM) but would like to revisit GOC conversations if intubation imminent; additionally while discussing renal failure I broached the topic of possible HD and patient shouted "I won't do it".]  Critical care time:    CRITICAL CARE Performed by: Cristopher Peru  Total critical care time: 33 minutes  Critical care time was exclusive of separately billable procedures and treating other patients.  Critical care was necessary to treat or prevent imminent or life-threatening deterioration.  Critical care was time spent personally by me on the following activities: development of treatment plan with patient and/or surrogate as well as nursing, discussions with consultants, evaluation of patient's response to treatment, examination of patient, obtaining history from patient or surrogate, ordering and performing treatments and interventions, ordering and review of laboratory studies, ordering and review of radiographic studies, pulse oximetry and re-evaluation of patient's condition.   Cristopher Peru, PA-C [facility] 01/01/23 10:48 AM  Please see [email or web address] for pager details.  From 7A-7P if no response, please call [phone] After hours, please call ELink [phone]

## 2023-01-01 NOTE — Plan of Care (Signed)
  Problem: Clinical Measurements: Goal: Ability to maintain clinical measurements within normal limits will improve Outcome: Progressing   Problem: Education: Goal: Knowledge of General Education information will improve Description: Including pain rating scale, medication(s)/side effects and non-pharmacologic comfort measures 01/01/2023 1910 by Beatrix Shipper, RN Outcome: Not Progressing 01/01/2023 1908 by Beatrix Shipper, RN Outcome: Not Progressing   Problem: Health Behavior/Discharge Planning: Goal: Ability to manage health-related needs will improve 01/01/2023 1910 by Beatrix Shipper, RN Outcome: Not Progressing 01/01/2023 1908 by Beatrix Shipper, RN Outcome: Not Progressing

## 2023-01-01 NOTE — Progress Notes (Signed)
Spoke with RN concerning PT being on 5 LPM [place]. If PT is able to maintain Sp02 >=92% PT can be transitioned to standard nasal cannula. PT just received nebulizer treatment so Sp02 is 100% (RN aware).

## 2023-01-01 NOTE — Plan of Care (Signed)
Problem: Clinical Measurements: Goal: Ability to maintain clinical measurements within normal limits will improve Outcome: Progressing   Problem: Education: Goal: Knowledge of General Education information will improve Description: Including pain rating scale, medication(s)/side effects and non-pharmacologic comfort measures Outcome: Not Progressing   Problem: Health Behavior/Discharge Planning: Goal: Ability to manage health-related needs will improve Outcome: Not Progressing

## 2023-01-01 NOTE — Progress Notes (Signed)
   01/01/23 2321  BiPAP/CPAP/SIPAP  BiPAP/CPAP/SIPAP Pt Type Adult  BiPAP/CPAP/SIPAP V60  Reason BIPAP/CPAP not in use Non-compliant (pt refuses)

## 2023-01-01 NOTE — TOC Progression Note (Signed)
Transition of Care [facility]) - Progression Note    Patient Details  Name: [name] MRN: [id number] Date of Birth: [birth year]  [facility] CM/SW Contact  Darleene Cleaver, [place] Phone Number: 01/01/2023, 12:00 PM  Clinical Narrative:    Patient is LTC resident at [facility].  Patient states he has been satisfied staying there.  Patient has been there  since April 2024.  Plan is to return back to facility once he is medically ready for discharge.  TOC continuing to follow patient's progress throughout discharge planning.     Expected Discharge Plan: Long Term Nursing Home Barriers to Discharge: Continued Medical Work up  Expected Discharge Plan and Services In-house Referral: Clinical Social Work   Post Acute Care Choice: Nursing Home Living arrangements for the past 2 months: Skilled Nursing Facility                                       Social Determinants of Health (SDOH) Interventions SDOH Screenings   Food Insecurity: Patient Declined (12/23/2022)  Housing: Patient Declined (08/17/2022)  Transportation Needs: No Transportation Needs (08/17/2022)  Utilities: Not At Risk (08/17/2022)  Tobacco Use: High Risk (12/21/2022)    Readmission Risk Interventions    12/26/2022    4:01 PM  Readmission Risk Prevention Plan  Post Dischage Appt Complete  Medication Screening Complete  Transportation Screening Complete

## 2023-01-01 NOTE — Progress Notes (Signed)
Afternoon rounds. Having moderate abdominal pain r/t bilary drain. Drain is putting out ~33mL/shift. Given 40mg  Lasix today given that he had oxygen requirement and 19L + on admission. He responded well. Can consider redosing for tomorrow on [facility] service. Able to titrate his O2 from 10 to 3L today. TRH tomorrow. Ultimate decision for ERCP dependent on surgery/GI. Appreciate their recommendation.

## 2023-01-01 NOTE — Progress Notes (Signed)
11 Days Post-Op  Subjective: Continued pain issues, tolerated liquids yesterday  ROS: See above, otherwise other systems negative  Objective: Vital signs in last 24 hours: Temp:  [97 F (36.1 C)-98.2 F (36.8 C)] 97 F (36.1 C) (09/16 0600) Pulse Rate:  [64-159] 81 (09/16 0600) Resp:  [10-30] 11 (09/16 0600) BP: (100-168)/(43-103) 157/74 (09/16 0600) SpO2:  [89 %-100 %] 100 % (09/16 0600) Last BM Date : 12/31/22  Intake/Output from previous day: 09/15 0701 - 09/16 0700 In: 1201.2 [P.O.:480; I.V.:221; IV Piggyback:500.2] Out: 2400 [Urine:2235; Drains:40; Stool:125] Intake/Output this shift: Total I/O In: 500 [IV Piggyback:500] Out: -   PE: Gen: somnolent but arousable Resp: nonlabored CV: RRR Abd: incisions c/d/I, drain with bilious output and some leakage  Lab Results:  Recent Labs    12/31/22 0618 01/01/23 0500  WBC 30.9* 25.0*  HGB 10.5* 10.3*  HCT 33.9* 33.9*  PLT 190 169   BMET Recent Labs    12/30/22 0707 12/31/22 0618  NA 134* 132*  K 4.1 4.6  CL 99 98  CO2 19* 21*  GLUCOSE 107* 127*  BUN 44* 58*  CREATININE 3.59* 3.45*  CALCIUM 7.4* 8.0*   PT/INR Recent Labs    12/30/22 0707  LABPROT 14.2  INR 1.1   CMP     Component Value Date/Time   NA 132 (L) 12/31/2022 0618   NA 139 11/14/2012 0000   K 4.6 12/31/2022 0618   K 4.1 11/14/2012 0000   CL 98 12/31/2022 0618   CO2 21 (L) 12/31/2022 0618   GLUCOSE 127 (H) 12/31/2022 0618   BUN 58 (H) 12/31/2022 0618   BUN 11 11/14/2012 0000   CREATININE 3.45 (H) 12/31/2022 0618   CREATININE 0.62 11/14/2012 0000   CALCIUM 8.0 (L) 12/31/2022 0618   PROT 6.2 (L) 12/31/2022 0618   ALBUMIN 2.4 (L) 12/31/2022 0618   ALBUMIN 2.6 11/14/2012 0000   AST 20 12/31/2022 0618   ALT 34 12/31/2022 0618   ALKPHOS 105 12/31/2022 0618   BILITOT 1.0 12/31/2022 0618   GFRNONAA 22 (L) 12/31/2022 0618   GFRAA >60 12/30/2017 0619   Lipase     Component Value Date/Time   LIPASE 14 12/30/2022 0938     Studies/Results: No results found.  Anti-infectives: Anti-infectives (From admission, onward)    Start     Dose/Rate Route Frequency Ordered Stop   12/31/22 1245  linezolid (ZYVOX) IVPB 600 mg        600 mg 300 mL/hr over 60 Minutes Intravenous 2 times daily 12/31/22 1145     12/29/22 2200  ceFEPIme (MAXIPIME) 2 g in sodium chloride 0.9 % 100 mL IVPB  Status:  Discontinued        2 g 200 mL/hr over 30 Minutes Intravenous Every 12 hours 12/28/22 2127 12/29/22 1401   12/29/22 1500  ceFEPIme (MAXIPIME) 2 g in sodium chloride 0.9 % 100 mL IVPB        2 g 200 mL/hr over 30 Minutes Intravenous 2 times daily 12/29/22 1401     12/29/22 1215  linezolid (ZYVOX) IVPB 600 mg  Status:  Discontinued        600 mg 300 mL/hr over 60 Minutes Intravenous Every 12 hours 12/29/22 1120 12/31/22 1145   12/29/22 0400  vancomycin (VANCOREADY) IVPB 1250 mg/250 mL  Status:  Discontinued        1,250 mg 166.7 mL/hr over 90 Minutes Intravenous Every 12 hours 12/28/22 1512 12/28/22 2127   12/28/22 2126  vancomycin variable  dose per unstable renal function (pharmacist dosing)  Status:  Discontinued         Does not apply See admin instructions 12/28/22 2127 12/29/22 1120   12/28/22 2100  ceFEPIme (MAXIPIME) 2 g in sodium chloride 0.9 % 100 mL IVPB  Status:  Discontinued        2 g 200 mL/hr over 30 Minutes Intravenous Every 8 hours 12/28/22 1512 12/28/22 2127   12/28/22 2100  metroNIDAZOLE (FLAGYL) IVPB 500 mg        500 mg 100 mL/hr over 60 Minutes Intravenous Every 12 hours 12/28/22 1512     12/28/22 1600  vancomycin (VANCOREADY) IVPB 2000 mg/400 mL        2,000 mg 200 mL/hr over 120 Minutes Intravenous  Once 12/28/22 1512 12/28/22 1752   12/28/22 1230  piperacillin-tazobactam (ZOSYN) IVPB 3.375 g  Status:  Discontinued        3.375 g 12.5 mL/hr over 240 Minutes Intravenous Every 8 hours 12/28/22 1116 12/28/22 1503   12/21/22 0707  ceFAZolin (ANCEF) 3-0.9 GM/100ML-% IVPB       Note to Pharmacy:  Blair Promise B: cabinet override      12/21/22 0707 12/21/22 1914   12/21/22 0600  ceFAZolin (ANCEF) IVPB 2g/100 mL premix  Status:  Discontinued        2 g 200 mL/hr over 30 Minutes Intravenous On call to O.R. 12/21/22 0541 12/21/22 1058       Assessment/Plan  37 yo male with super morbid obesity s/p partial cholecystectomy with bilious drainage. Last week he had a sepsis like event. He has now been weaned off pressors. Last night he again refused bipap.   FEN - NPO in case of procedure, can have diet if no procedure planned VTE - heparin sq ID - cefepime, vanc Dispo - ICU, leukocytosis improving, Cr stable  I reviewed last 24 h vitals and pain scores, last 48 h intake and output, last 24 h labs and trends, and last 24 h imaging results.  This care required high  level of medical decision making.    LOS: 10 days   [facility] 01/01/2023, 7:34 AM Please see Amion for pager number during day hours 7:00am-4:30pm or 7:00am -11:30am on weekends

## 2023-01-01 NOTE — Progress Notes (Signed)
PHARMACY NOTE:  ANTIMICROBIAL RENAL DOSAGE ADJUSTMENT  Current antimicrobial regimen includes a mismatch between antimicrobial dosage and estimated renal function.  As per policy approved by the Pharmacy & Therapeutics and Medical Executive Committees, the antimicrobial dosage will be adjusted accordingly.  Current antimicrobial dosage:  cefepime 2 g IV q12h  Indication: IAI  Renal Function:  Estimated Creatinine Clearance: 80 mL/min (A) (by C-G formula based on SCr of 2.39 mg/dL (H)). []      On intermittent HD, scheduled: []      On CRRT    Antimicrobial dosage has been changed to:  Cefepime 2 g IV q8h  Cindi Carbon, PharmD 01/01/23 1:15 PM

## 2023-01-02 DIAGNOSIS — K8010 Calculus of gallbladder with chronic cholecystitis without obstruction: Secondary | ICD-10-CM | POA: Diagnosis not present

## 2023-01-02 DIAGNOSIS — N179 Acute kidney failure, unspecified: Secondary | ICD-10-CM | POA: Diagnosis not present

## 2023-01-02 DIAGNOSIS — A419 Sepsis, unspecified organism: Secondary | ICD-10-CM | POA: Diagnosis not present

## 2023-01-02 DIAGNOSIS — J9601 Acute respiratory failure with hypoxia: Secondary | ICD-10-CM | POA: Diagnosis not present

## 2023-01-02 LAB — CBC
HCT: 33.4 % — ABNORMAL LOW (ref 39.0–52.0)
Hemoglobin: 10.3 g/dL — ABNORMAL LOW (ref 13.0–17.0)
MCH: 27.9 pg (ref 26.0–34.0)
MCHC: 30.8 g/dL (ref 30.0–36.0)
MCV: 90.5 fL (ref 80.0–100.0)
Platelets: 177 10*3/uL (ref 150–400)
RBC: 3.69 MIL/uL — ABNORMAL LOW (ref 4.22–5.81)
RDW: 14.3 % (ref 11.5–15.5)
WBC: 20.4 10*3/uL — ABNORMAL HIGH (ref 4.0–10.5)
nRBC: 0 % (ref 0.0–0.2)

## 2023-01-02 LAB — CULTURE, BLOOD (ROUTINE X 2)
Culture: NO GROWTH
Special Requests: ADEQUATE

## 2023-01-02 LAB — GLUCOSE, CAPILLARY
Glucose-Capillary: 101 mg/dL — ABNORMAL HIGH (ref 70–99)
Glucose-Capillary: 103 mg/dL — ABNORMAL HIGH (ref 70–99)
Glucose-Capillary: 108 mg/dL — ABNORMAL HIGH (ref 70–99)
Glucose-Capillary: 117 mg/dL — ABNORMAL HIGH (ref 70–99)
Glucose-Capillary: 124 mg/dL — ABNORMAL HIGH (ref 70–99)
Glucose-Capillary: 99 mg/dL (ref 70–99)

## 2023-01-02 LAB — COMPREHENSIVE METABOLIC PANEL
ALT: 28 U/L (ref 0–44)
AST: 14 U/L — ABNORMAL LOW (ref 15–41)
Albumin: 2.3 g/dL — ABNORMAL LOW (ref 3.5–5.0)
Alkaline Phosphatase: 106 U/L (ref 38–126)
Anion gap: 12 (ref 5–15)
BUN: 58 mg/dL — ABNORMAL HIGH (ref 6–20)
CO2: 25 mmol/L (ref 22–32)
Calcium: 8.2 mg/dL — ABNORMAL LOW (ref 8.9–10.3)
Chloride: 103 mmol/L (ref 98–111)
Creatinine, Ser: 1.34 mg/dL — ABNORMAL HIGH (ref 0.61–1.24)
GFR, Estimated: >60 mL/min (ref >60)
Glucose, Bld: 111 mg/dL — ABNORMAL HIGH (ref 70–99)
Potassium: 3.5 mmol/L (ref 3.5–5.1)
Sodium: 140 mmol/L (ref 135–145)
Total Bilirubin: 0.8 mg/dL (ref 0.3–1.2)
Total Protein: 6 g/dL — ABNORMAL LOW (ref 6.5–8.1)

## 2023-01-02 MED ORDER — ENSURE MAX PROTEIN PO LIQD
11.0000 [oz_av] | Freq: Every day | ORAL | Status: DC
  Administered 2023-01-02 – 2023-01-12 (×8): 11 [oz_av] via ORAL
  Filled 2023-01-02 (×11): qty 330

## 2023-01-02 MED ORDER — HYDROCORTISONE SOD SUC (PF) 100 MG IJ SOLR
INTRAMUSCULAR | Status: AC
  Administered 2023-01-02: 100 mg via INTRAVENOUS
  Filled 2023-01-02: qty 2

## 2023-01-02 MED ORDER — FUROSEMIDE 10 MG/ML IJ SOLN
40.0000 mg | Freq: Once | INTRAMUSCULAR | Status: AC
  Administered 2023-01-02: 40 mg via INTRAVENOUS
  Filled 2023-01-02: qty 4

## 2023-01-02 MED ORDER — FUROSEMIDE 10 MG/ML IJ SOLN: 40.0000 mg | Freq: Every day | INTRAMUSCULAR | Status: DC

## 2023-01-02 NOTE — Progress Notes (Signed)
   01/02/23 2047  BiPAP/CPAP/SIPAP  BiPAP/CPAP/SIPAP Pt Type Adult  BiPAP/CPAP/SIPAP V60  Reason BIPAP/CPAP not in use Non-compliant (Patient continues to refuse BiPAP qhs.  Patient encouraged to contact RT should he change his mind.  Machine remains in room for emergencies.)

## 2023-01-02 NOTE — Progress Notes (Signed)
[facility] CONSULT progress note                                                                              [name], is a 37 y.o. male, DOB - 1985/08/02, [id number] Admit date - 12/21/2022    Outpatient Primary MD for the patient is Hasanaj, Myra Gianotti, MD  LOS - 11  days  No chief complaint on file.      Brief summary   Patient is a 37 y/o male with obesity, NASH who presented with acute cholecystitis requiring cholecystectomy, which was complicated by his hepatomegaly. He had a bile leak post-op requiring a drain  Significant Hospital Events:   9/5 - Underwent elective lap chole with CCS. Partial cholecystectomy completed in the setting of poor visualization. EBL . Drain left in place. 9/12 - Transferred to SDU for worsening hypoxia, tachypnea, tachycardia. Hypotensive. CT Chest/A/P with postoperative changes, surgical drain in gallbladder fossa, minimal L basilar subsegmental atelectasis, L adrenal adenoma. TRH consulted. Fluids/Zosyn given, broadened to vanc/cefepime/Flagyl. Refused BiPAP/stated he would refuse intubation even if it meant death. Made DNR. Later in shift more hypotensive, PCCM consulted for pressors. Code status changed back to full code. Vasopressors initiated. 9/13 - A-line, LIJ CVC placed. High pressor requirements with NE 38, Neo 70, vaso 0.03. 9/16: neo stopped 9/13. NE and vaso stopped 9/15. Duoneb for wheezing. A-line removed. Transfer to SDU.  9/17: CCM requested TRH to continue to follow for medical management.  Surgery remains primary service.  Assessment & Plan    Principal Problem:   Chronic calculous cholecystitis, sepsis from biliary source Biliary leak, post operatively.  -Currently off vasopressors, sepsis physiology improving, BP now elevated -On broad-spectrum antibiotics, management per general surgery -Seen by GI this admission, plan and timing for ERCP per GI -Blood cultures 1/2 staph epi, currently on cefepime  linezolid and Flagyl -Biliary drain in place  Active Problems: Acute on chronic respiratory failure with hypoxia -Multifactorial secondary to aggressive fluid resuscitation during shock, OSA, OHS - BiPAP as needed, received IV Lasix 40 mg x 1 on 9/16 (per PCCM) for fluid overload -Still positive balance of 15.8 L -BP currently stable, placed on Lasix 40 mg IVx1 today. -Wean O2 as tolerated    OHS/OSA: Largely related to habitus, morbid obesity -- BiPAP as above   Acute kidney injury -  In the setting of hypotension, sepsis.  Likely ATN and combination of obstructive physiology, unable to pass urine prior to Foley placement.   - Status post Foley placement with a cystoscopy 9/14 by urology -Continue Foley, creatinine plateaued at 3.59, now improving, 1.3 today - F/u renal function closely with diuresis  Morbidly obese Estimated body mass index is 81.14 kg/m as calculated from the following:   Height as of this encounter: 5\' 7"  (1.702 m).   Weight as of this encounter: 235 kg.  Code Status: Full code DVT Prophylaxis:  heparin injection 5,000 Units Start: 12/29/22 1430 SCD's Start: 12/21/22 1116   Level of Care: Level of care: Progressive Family Communication: Updated patient Disposition Plan:      Remains inpatient appropriate:  per primary service, surgery  Antimicrobials:   Anti-infectives (From admission, onward)    Start     Dose/Rate Route Frequency Ordered Stop   01/01/23 2000  ceFEPIme (MAXIPIME) 2 g in sodium chloride 0.9 % 100 mL IVPB        2 g 200 mL/hr over 30 Minutes Intravenous Every 8 hours 01/01/23 1311     12/31/22 1245  linezolid (ZYVOX) IVPB 600 mg        600 mg 300 mL/hr over 60 Minutes Intravenous 2 times daily 12/31/22 1145     12/29/22 2200  ceFEPIme (MAXIPIME) 2 g in sodium chloride 0.9 % 100 mL IVPB  Status:  Discontinued        2 g 200 mL/hr over 30 Minutes Intravenous Every 12 hours 12/28/22 2127 12/29/22 1401   12/29/22 1500  ceFEPIme  (MAXIPIME) 2 g in sodium chloride 0.9 % 100 mL IVPB  Status:  Discontinued        2 g 200 mL/hr over 30 Minutes Intravenous 2 times daily 12/29/22 1401 01/01/23 1311   12/29/22 1215  linezolid (ZYVOX) IVPB 600 mg  Status:  Discontinued        600 mg 300 mL/hr over 60 Minutes Intravenous Every 12 hours 12/29/22 1120 12/31/22 1145   12/29/22 0400  vancomycin (VANCOREADY) IVPB 1250 mg/250 mL  Status:  Discontinued        1,250 mg 166.7 mL/hr over 90 Minutes Intravenous Every 12 hours 12/28/22 1512 12/28/22 2127   12/28/22 2126  vancomycin variable dose per unstable renal function (pharmacist dosing)  Status:  Discontinued         Does not apply See admin instructions 12/28/22 2127 12/29/22 1120   12/28/22 2100  ceFEPIme (MAXIPIME) 2 g in sodium chloride 0.9 % 100 mL IVPB  Status:  Discontinued        2 g 200 mL/hr over 30 Minutes Intravenous Every 8 hours 12/28/22 1512 12/28/22 2127   12/28/22 2100  metroNIDAZOLE (FLAGYL) IVPB 500 mg        500 mg 100 mL/hr over 60 Minutes Intravenous Every 12 hours 12/28/22 1512     12/28/22 1600  vancomycin (VANCOREADY) IVPB 2000 mg/400 mL        2,000 mg 200 mL/hr over 120 Minutes Intravenous  Once 12/28/22 1512 12/28/22 1752   12/28/22 1230  piperacillin-tazobactam (ZOSYN) IVPB 3.375 g  Status:  Discontinued        3.375 g 12.5 mL/hr over 240 Minutes Intravenous Every 8 hours 12/28/22 1116 12/28/22 1503   12/21/22 0707  ceFAZolin (ANCEF) 3-0.9 GM/100ML-% IVPB       Note to Pharmacy: [facility] B: cabinet override      12/21/22 0707 12/21/22 1914   12/21/22 0600  ceFAZolin (ANCEF) IVPB 2g/100 mL premix  Status:  Discontinued        2 g 200 mL/hr over 30 Minutes Intravenous On call to O.R. 12/21/22 0541 12/21/22 1058          Medications  allopurinol  100 mg Oral Daily   bisacodyl  10 mg Rectal Daily   Chlorhexidine Gluconate Cloth  6 each Topical Daily   gabapentin  100 mg Oral QID   Gerhardt's butt cream   Topical BID   heparin  injection (subcutaneous)  5,000 Units Subcutaneous Q8H   hydrocortisone  1 Application Rectal BID   insulin aspart  0-20 Units Subcutaneous Q4H   lidocaine  1 patch Transdermal Daily   nystatin   Topical BID   pantoprazole (PROTONIX)  IV  40 mg Intravenous Q12H   Ensure Max Protein  11 oz Oral Daily   sodium chloride flush  10-40 mL Intracatheter Q12H      Subjective:   [name] was seen and examined today.  Complaining of abdominal pain, 8/10, feels uncomfortable.  No acute chest pain, nausea vomiting, fevers  Objective:   Vitals:   01/02/23 0603 01/02/23 0800 01/02/23 0900 01/02/23 1000  BP:  (!) 159/76 (!) 150/82 (!) 154/60  Pulse: 87 79 (!) 105 81  Resp:  15 (!) 23 15  Temp: 97.9 F (36.6 C) 98.2 F (36.8 C) 98.2 F (36.8 C) 98.4 F (36.9 C)  TempSrc:  Bladder    SpO2: 93% 97% 94% 94%  Weight:      Height:        Intake/Output Summary (Last 24 hours) at 01/02/2023 1050 Last data filed at 01/02/2023 1000 Gross per 24 hour  Intake 1358.32 ml  Output 4785 ml  Net -3426.68 ml     Wt Readings from Last 3 Encounters:  12/21/22 (!) 235 kg  08/17/22 (!) 235 kg  08/11/22 (!) 181.4 kg     Exam General: Alert and oriented x 3, NAD, appears uncomfortable, ill-appearing Cardiovascular: S1 S2 auscultated,  RRR Respiratory: CTAB bilaterally anteriorly Gastrointestinal: Morbidly obese, soft, generalized TTP Ext: + pedal edema bilaterally Neuro: moves all extremities, severe deconditioning Psych: Normal affect     Data Reviewed:  I have personally reviewed following labs    CBC Lab Results  Component Value Date   WBC 20.4 (H) 01/02/2023   RBC 3.69 (L) 01/02/2023   HGB 10.3 (L) 01/02/2023   HCT 33.4 (L) 01/02/2023   MCV 90.5 01/02/2023   MCH 27.9 01/02/2023   PLT 177 01/02/2023   MCHC 30.8 01/02/2023   RDW 14.3 01/02/2023   LYMPHSABS 0.3 (L) 12/28/2022   MONOABS 1.5 (H) 12/28/2022   EOSABS 0.0 12/28/2022   BASOSABS 0.2 (H) 12/28/2022     Last  metabolic panel Lab Results  Component Value Date   NA 140 01/02/2023   K 3.5 01/02/2023   CL 103 01/02/2023   CO2 25 01/02/2023   BUN 58 (H) 01/02/2023   CREATININE 1.34 (H) 01/02/2023   GLUCOSE 111 (H) 01/02/2023   GFRNONAA >60 01/02/2023   GFRAA >60 12/30/2017   CALCIUM 8.2 (L) 01/02/2023   PHOS 4.8 (H) 12/30/2022   PROT 6.0 (L) 01/02/2023   ALBUMIN 2.3 (L) 01/02/2023   BILITOT 0.8 01/02/2023   ALKPHOS 106 01/02/2023   AST 14 (L) 01/02/2023   ALT 28 01/02/2023   ANIONGAP 12 01/02/2023    CBG (last 3)  Recent Labs    01/01/23 2308 01/02/23 0340 01/02/23 0732  GLUCAP 88 101* 99      Coagulation Profile: Recent Labs  Lab 12/28/22 1147 12/30/22 0707  INR 1.2 1.1     Radiology Studies: I have personally reviewed the imaging studies  No results found.     Thad Ranger M.D. [facility] 01/02/2023, 10:50 AM  Available via Epic secure chat 7am-7pm After 7 pm, please refer to night coverage provider listed on amion.

## 2023-01-02 NOTE — Plan of Care (Signed)

## 2023-01-02 NOTE — Plan of Care (Signed)

## 2023-01-03 DIAGNOSIS — Z593 Problems related to living in residential institution: Secondary | ICD-10-CM

## 2023-01-03 DIAGNOSIS — K219 Gastro-esophageal reflux disease without esophagitis: Secondary | ICD-10-CM

## 2023-01-03 DIAGNOSIS — N179 Acute kidney failure, unspecified: Secondary | ICD-10-CM | POA: Diagnosis not present

## 2023-01-03 DIAGNOSIS — J9601 Acute respiratory failure with hypoxia: Secondary | ICD-10-CM | POA: Diagnosis not present

## 2023-01-03 DIAGNOSIS — K279 Peptic ulcer, site unspecified, unspecified as acute or chronic, without hemorrhage or perforation: Secondary | ICD-10-CM

## 2023-01-03 DIAGNOSIS — K8010 Calculus of gallbladder with chronic cholecystitis without obstruction: Secondary | ICD-10-CM | POA: Diagnosis not present

## 2023-01-03 DIAGNOSIS — Z6841 Body Mass Index (BMI) 40.0 and over, adult: Secondary | ICD-10-CM

## 2023-01-03 LAB — GLUCOSE, CAPILLARY
Glucose-Capillary: 138 mg/dL — ABNORMAL HIGH (ref 70–99)
Glucose-Capillary: 153 mg/dL — ABNORMAL HIGH (ref 70–99)
Glucose-Capillary: 141 mg/dL — ABNORMAL HIGH (ref 70–99)
Glucose-Capillary: 127 mg/dL — ABNORMAL HIGH (ref 70–99)
Glucose-Capillary: 126 mg/dL — ABNORMAL HIGH (ref 70–99)

## 2023-01-03 LAB — BASIC METABOLIC PANEL WITH GFR
Anion gap: 9 (ref 5–15)
BUN: 43 mg/dL — ABNORMAL HIGH (ref 6–20)
CO2: 28 mmol/L (ref 22–32)
Calcium: 7.9 mg/dL — ABNORMAL LOW (ref 8.9–10.3)
Chloride: 101 mmol/L (ref 98–111)
Creatinine, Ser: 0.72 mg/dL (ref 0.61–1.24)
GFR, Estimated: >60 mL/min (ref >60)
Glucose, Bld: 145 mg/dL — ABNORMAL HIGH (ref 70–99)
Potassium: 3 mmol/L — ABNORMAL LOW (ref 3.5–5.1)
Sodium: 138 mmol/L (ref 135–145)

## 2023-01-03 LAB — CBC
HCT: 35.9 % — ABNORMAL LOW (ref 39.0–52.0)
Hemoglobin: 11.1 g/dL — ABNORMAL LOW (ref 13.0–17.0)
MCH: 27.8 pg (ref 26.0–34.0)
MCHC: 30.9 g/dL (ref 30.0–36.0)
MCV: 90 fL (ref 80.0–100.0)
Platelets: 177 10*3/uL (ref 150–400)
RBC: 3.99 MIL/uL — ABNORMAL LOW (ref 4.22–5.81)
RDW: 14 % (ref 11.5–15.5)
WBC: 15.9 10*3/uL — ABNORMAL HIGH (ref 4.0–10.5)
nRBC: 0 % (ref 0.0–0.2)

## 2023-01-03 LAB — MAGNESIUM: Magnesium: 2.1 mg/dL (ref 1.7–2.4)

## 2023-01-03 LAB — PHOSPHORUS: Phosphorus: 1.8 mg/dL — ABNORMAL LOW (ref 2.5–4.6)

## 2023-01-03 MED ORDER — GABAPENTIN 300 MG PO CAPS
300.0000 mg | ORAL_CAPSULE | Freq: Four times a day (QID) | ORAL | Status: DC
  Administered 2023-01-03 – 2023-01-12 (×36): 300 mg via ORAL
  Filled 2023-01-03 (×36): qty 1

## 2023-01-03 MED ORDER — FENTANYL CITRATE PF 50 MCG/ML IJ SOSY
100.0000 ug | PREFILLED_SYRINGE | INTRAMUSCULAR | Status: DC | PRN
  Administered 2023-01-03 – 2023-01-11 (×45): 100 ug via INTRAVENOUS
  Filled 2023-01-03 (×46): qty 2

## 2023-01-03 MED ORDER — POTASSIUM CHLORIDE 20 MEQ PO PACK
40.0000 meq | PACK | Freq: Once | ORAL | Status: AC
  Administered 2023-01-03: 40 meq via ORAL
  Filled 2023-01-03: qty 2

## 2023-01-03 MED ORDER — POTASSIUM CHLORIDE 10 MEQ/100ML IV SOLN
10.0000 meq | INTRAVENOUS | Status: AC
  Administered 2023-01-03 (×4): 10 meq via INTRAVENOUS
  Filled 2023-01-03 (×4): qty 100

## 2023-01-03 MED ORDER — POTASSIUM PHOSPHATES 15 MMOLE/5ML IV SOLN
20.0000 mmol | Freq: Once | INTRAVENOUS | Status: AC
  Administered 2023-01-03: 20 mmol via INTRAVENOUS
  Filled 2023-01-03: qty 6.67

## 2023-01-03 MED ORDER — POTASSIUM CHLORIDE CRYS ER 20 MEQ PO TBCR
20.0000 meq | EXTENDED_RELEASE_TABLET | ORAL | Status: AC
  Administered 2023-01-03 (×2): 20 meq via ORAL
  Filled 2023-01-03 (×2): qty 1

## 2023-01-03 NOTE — Plan of Care (Signed)
  Problem: Education: Goal: Knowledge of General Education information will improve Description: Including pain rating scale, medication(s)/side effects and non-pharmacologic comfort measures Outcome: Progressing   Problem: Health Behavior/Discharge Planning: Goal: Ability to manage health-related needs will improve Outcome: Progressing   Problem: Clinical Measurements: Goal: Ability to maintain clinical measurements within normal limits will improve Outcome: Progressing Goal: Will remain free from infection Outcome: Progressing Goal: Diagnostic test results will improve Outcome: Progressing Goal: Respiratory complications will improve Outcome: Progressing Goal: Cardiovascular complication will be avoided Outcome: Progressing   Problem: Activity: Goal: Risk for activity intolerance will decrease Outcome: Progressing   Problem: Nutrition: Goal: Adequate nutrition will be maintained Outcome: Progressing   Problem: Coping: Goal: Level of anxiety will decrease Outcome: Progressing   Problem: Elimination: Goal: Will not experience complications related to bowel motility Outcome: Progressing Goal: Will not experience complications related to urinary retention Outcome: Progressing   Problem: Pain Managment: Goal: General experience of comfort will improve Outcome: Progressing   Problem: Safety: Goal: Ability to remain free from injury will improve Outcome: Progressing   Problem: Skin Integrity: Goal: Risk for impaired skin integrity will decrease Outcome: Progressing    Problem: Metabolic: Goal: Ability to maintain appropriate glucose levels will improve Outcome: Progressing   Problem: Nutritional: Goal: Maintenance of adequate nutrition will improve Outcome: Progressing Goal: Progress toward achieving an optimal weight will improve Outcome: Progressing   Problem: Skin Integrity: Goal: Risk for impaired skin integrity will decrease Outcome: Progressing    Problem: Tissue Perfusion: Goal: Adequacy of tissue perfusion will improve Outcome: Progressing  Cindy S. Clelia Croft BSN, RN, CCRP, CCRN 01/03/2023 4:31 AM

## 2023-01-03 NOTE — Plan of Care (Signed)

## 2023-01-03 NOTE — Progress Notes (Signed)
S: patient uncomfortable, tolerated liquids O: BP (!) 153/69   Pulse 92   Temp 99.5 F (37.5 C)   Resp 17   Ht 5\' 7"  (1.702 m)   Wt (!) 235 kg   SpO2 96%   BMI 81.14 kg/m  Gen: NAD Neuro: AOx4 Abd: soft, drain with bilious output  A/P s/p partial cholecystectomy with bile leak, complicated with sepsis now resolving. -continue abx -stepdown status -full liquids plus ensure max -encouraged using bipap at night to avoid further serious complication

## 2023-01-03 NOTE — Hospital Course (Addendum)
The Patient [name] is a 37 y/o male with obesity, NASH who presented with acute cholecystitis requiring cholecystectomy, which was complicated by his hepatomegaly. He had a bile leak post-op requiring a drain  Significant Hospital Events:    9/5 - Underwent elective lap chole with CCS. Partial cholecystectomy completed in the setting of poor visualization. EBL . Drain left in place. 9/12 - Transferred to SDU for worsening hypoxia, tachypnea, tachycardia. Hypotensive. CT Chest/A/P with postoperative changes, surgical drain in gallbladder fossa, minimal L basilar subsegmental atelectasis, L adrenal adenoma. TRH consulted. Fluids/Zosyn given, broadened to vanc/cefepime/Flagyl. Refused BiPAP/stated he would refuse intubation even if it meant death. Made DNR. Later in shift more hypotensive, PCCM consulted for pressors. Code status changed back to full code. Vasopressors initiated. 9/13 - A-line, LIJ CVC placed. High pressor requirements with NE 38, Neo 70, vaso 0.03. 9/16: neo stopped 9/13. NE and vaso stopped 9/15. Duoneb for wheezing. A-line removed. Transfer to SDU.  9/17: CCM requested TRH to continue to follow for medical management.  Surgery remains primary service.  9/18 -Had some Transient Bradycardia and ? Pause; will replete electrolytes and continue to monitor.  Patient's renal function is improved  9/19 -Spiked a temperature yesterday and so we will obtain blood cultures x 2 and workup with fever given that WBC slightly worsened to 17.7.   9/20 - WBC still about the same. Repeat CT Scan pending. Patient states he did not sleep very well.   Assessment and Plan:  Chronic Calculous Cholecystitis, sepsis from biliary source, improving slowly Biliary leak, post operatively.  -Currently off vasopressors, sepsis physiology improving but still spiking intermittent temp, BP now elevated -WBC Trend: Recent Labs  Lab 12/30/22 0707 12/31/22 0618 01/01/23 0500 01/02/23 0341  01/03/23 0325 01/04/23 0445 01/05/23 0419  WBC 25.7* 30.9* 25.0* 20.4* 15.9* 17.7* 17.5*  -On broad-spectrum antibiotics, management per general surgery -Continues to spike some intermittent temperatures and had a Tmax of 100.6 today will need to continue monitor carefully and ordered Blood Cx x2; Agree with Surgery obtaining U/A and CXR -Repeat CXR done and showed "Increasing streaky bibasilar opacities, which may represent atelectasis or infection. Cardiomegaly with pulmonary vascular congestion." -U/A done and showed a hazy appearance with small hemoglobin, large leukocytes, negative nitrites, rare bacteria, 11-20 RBC per high-power field and 21-50 WBCs -Repeat CT Scan of the Abdomen and Pelvis pending  -Procalcitonin level was 3.33 however on admission it was greater than 150; repeat in the a.m. -Seen by GI this admission, plan and timing for ERCP per GI -Blood cultures 1/2 staph epi, currently on Cefepime and Flagyl; blood cultures x 2 being reobtained and repeat blood culture showing no growth to date less than 24 hours -Biliary drain in place -Further care per primary General Surgery with pain control -Patient is on Carb Modified Diet now    Acute on chronic respiratory failure with hypoxia -Multifactorial secondary to aggressive fluid resuscitation during shock, OSA, OHS - BiPAP as needed, received IV Lasix 40 mg x 1 on 9/16 (per PCCM) for fluid overload -Still positive balance of 6.505 L -BP currently stable, given Lasix 40 mg IVx1 yesterday on 9/17 and will hold today -Reorder a dose of IV Lasix 40 mg x1 yesterday  SpO2: 97 % O2 Flow Rate (L/min): 2 L/min FiO2 (%): 35 % -Continuous Pulse Oximetry and maintain O2 saturation greater than 90% -Continue supplemental oxygen via nasal cannula and wean oxygen to room air -Repeat CXR done and showed "Increasing streaky bibasilar opacities, which may  represent atelectasis or infection. Cardiomegaly with pulmonary vascular  congestion." -Initiate Flutter Valve, Incentive Spirometry, Guaifenesin 1200 po BID -Continue to monitor oxygen requirements as necessary   Electrolyte Abnormalities including Hypokalemia and Hypophosphatemia and Hypomagnesemia -K, Mag, and Phos Level Trend: Recent Labs  Lab 12/30/22 0707 12/31/22 0618 01/01/23 0500 01/02/23 0341 01/03/23 0324 01/03/23 0325 01/04/23 0445 01/05/23 0419  K 4.1 4.6 3.8 3.5  --  3.0* 3.2* 3.7  MG 1.8  --   --   --    < >  --  1.5* 1.7  PHOS 4.8*  --   --   --    < >  --  1.9* 2.6   < > = values in this interval not displayed.  -Replete with IV Mag Sulfate 2 grams -Conitnue To monitor replete as necessary repeat CMP in a.m. along with Phos p.o.  OHS/OSA -Largely related to habitus, morbid obesity -BiPAP as above  AKI, improved and resolved  -In the setting of hypotension, sepsis.  Likely ATN and combination of obstructive physiology, unable to pass urine prior to Foley placement.   - Status post Foley placement with a cystoscopy 9/14 by Urology -BUN/Cr Trend: Recent Labs  Lab 12/30/22 0707 12/31/22 0618 01/01/23 0500 01/02/23 0341 01/03/23 0325 01/04/23 0445 01/05/23 0419  BUN 44* 58* 64* 58* 43* 22* 16  CREATININE 3.59* 3.45* 2.39* 1.34* 0.72 0.45* 0.55*  -Avoid Nephrotoxic Medications, Contrast Dyes, Hypotension and Dehydration to Ensure Adequate Renal Perfusion and will need to Renally Adjust Meds -Follow Renal Fxn Closely with Diuresis  -Continue to Monitor and Trend Renal Function carefully and repeat CMP in the AM   Normocytic Anemia -Hgb/Hct Trend: Recent Labs  Lab 12/30/22 0707 12/31/22 0618 01/01/23 0500 01/02/23 0341 01/03/23 0325 01/04/23 0445 01/05/23 0419  HGB 10.3* 10.5* 10.3* 10.3* 11.1* 11.4* 11.4*  HCT 33.2* 33.9* 33.9* 33.4* 35.9* 36.4* 35.8*  MCV 91.7 92.4 91.9 90.5 90.0 90.8 89.7  -Check Anemia Panel in a.m. -Continue to monitor for signs and symptoms bleeding; no overt bleeding noted -Repeat CBC in  a.m.  Hypoalbuminemia -Patient's Albumin Trend: Recent Labs  Lab 12/29/22 0107 12/30/22 0707 12/31/22 0618 01/01/23 0500 01/02/23 0341 01/04/23 0445 01/05/23 0419  ALBUMIN 2.5* 2.2* 2.4* 2.3* 2.3* 2.1* 2.1*  -Continue to Monitor and Trend and repeat CMP in the AM  Super Morbid Obesity -Complicates overall prognosis and care -Estimated body mass index is 81.14 kg/m as calculated from the following:   Height as of this encounter: 5\' 7"  (1.702 m).   Weight as of this encounter: 235 kg.  -Weight Loss and Dietary Counseling given

## 2023-01-03 NOTE — Progress Notes (Signed)
PROGRESS NOTE    [name]  WGN:[id number] DOB: 1985-07-22 DOA: 12/21/2022 PCP: Toma Deiters, MD   Brief Narrative:  The Patient [name] is a 37 y/o male with obesity, NASH who presented with acute cholecystitis requiring cholecystectomy, which was complicated by his hepatomegaly. He had a bile leak post-op requiring a drain  Significant Hospital Events:    9/5 - Underwent elective lap chole with CCS. Partial cholecystectomy completed in the setting of poor visualization. EBL . Drain left in place. 9/12 - Transferred to SDU for worsening hypoxia, tachypnea, tachycardia. Hypotensive. CT Chest/A/P with postoperative changes, surgical drain in gallbladder fossa, minimal L basilar subsegmental atelectasis, L adrenal adenoma. TRH consulted. Fluids/Zosyn given, broadened to vanc/cefepime/Flagyl. Refused BiPAP/stated he would refuse intubation even if it meant death. Made DNR. Later in shift more hypotensive, PCCM consulted for pressors. Code status changed back to full code. Vasopressors initiated. 9/13 - A-line, LIJ CVC placed. High pressor requirements with NE 38, Neo 70, vaso 0.03. 9/16: neo stopped 9/13. NE and vaso stopped 9/15. Duoneb for wheezing. A-line removed. Transfer to SDU.  9/17: CCM requested TRH to continue to follow for medical management.  Surgery remains primary service.  9/18 - Had some Transient Bradycardia and ? Pause; will replete electrolytes and continue to monitor.  Patient's renal function is improved  Assessment and Plan:  Chronic calculous cholecystitis, sepsis from biliary source, improving slowly Biliary leak, post operatively.  -Currently off vasopressors, sepsis physiology improving, BP now elevated -WBC Trend: Recent Labs  Lab 12/28/22 2112 12/29/22 0107 12/30/22 0707 12/31/22 0618 01/01/23 0500 01/02/23 0341 01/03/23 0325  WBC 31.8* 41.1* 25.7* 30.9* 25.0* 20.4* 15.9*  -On broad-spectrum antibiotics, management per general  surgery -Continues to spike some intermittent temperatures and had a Tmax of 100.6 today will need to continue monitor carefully -Seen by GI this admission, plan and timing for ERCP per GI -Blood cultures 1/2 staph epi, currently on cefepime linezolid and Flagyl -Biliary drain in place -Further care per primary General Surgery with pain control -Patient is on a liquid diet today and want to advance his diet   Acute on chronic respiratory failure with hypoxia -Multifactorial secondary to aggressive fluid resuscitation during shock, OSA, OHS - BiPAP as needed, received IV Lasix 40 mg x 1 on 9/16 (per PCCM) for fluid overload -Still positive balance of 10.318 L -BP currently stable, given Lasix 40 mg IVx1 yesterday on 9/17 and will hold today SpO2: 98 % O2 Flow Rate (L/min): 2 L/min FiO2 (%): 35 % -Continuous Pulse Oximetry and maintain O2 saturation greater than 90% -Continue supplemental oxygen via nasal cannula and wean oxygen to room air   Electrolyte Abnormalities including Hypokalemia and Hypophosphatemia -K and Phos Level Trend: Recent Labs  Lab 12/29/22 0107 12/29/22 0129 12/30/22 0707 12/31/22 0618 01/01/23 0500 01/02/23 0341 01/03/23 0324 01/03/23 0325  K 4.7 4.6 4.1 4.6 3.8 3.5  --  3.0*  PHOS 4.7*  --  4.8*  --   --   --  1.8*  --   -Replete with p.o. KCl, IV KCl and IV K-Phos -To monitor replete as necessary repeat CMP in a.m. along with Phos p.o.  OHS/OSA -Largely related to habitus, morbid obesity -BiPAP as above  AKI, improved -In the setting of hypotension, sepsis.  Likely ATN and combination of obstructive physiology, unable to pass urine prior to Foley placement.   - Status post Foley placement with a cystoscopy 9/14 by Urology -BUN/Cr Trend: Recent Labs  Lab 12/28/22 2039  12/29/22 0107 12/30/22 0707 12/31/22 0618 01/01/23 0500 01/02/23 0341 01/03/23 0325  BUN 25* 28* 44* 58* 64* 58* 43*  CREATININE 2.79* 3.08* 3.59* 3.45* 2.39* 1.34* 0.72   -Avoid Nephrotoxic Medications, Contrast Dyes, Hypotension and Dehydration to Ensure Adequate Renal Perfusion and will need to Renally Adjust Meds -Follow Renal Fxn Closely with Diuresis  -Continue to Monitor and Trend Renal Function carefully and repeat CMP in the AM   Normocytic Anemia -Hgb/Hct Trend: Recent Labs  Lab 12/29/22 0107 12/29/22 0129 12/30/22 0707 12/31/22 0618 01/01/23 0500 01/02/23 0341 01/03/23 0325  HGB 12.0* 12.9* 10.3* 10.5* 10.3* 10.3* 11.1*  HCT 39.9 38.0* 33.2* 33.9* 33.9* 33.4* 35.9*  MCV 95.5  --  91.7 92.4 91.9 90.5 90.0  -Check anemia panel in a.m. -Continue to monitor for signs and symptoms bleeding; no overt bleeding noted -Repeat CBC in a.m.  Hypoalbuminemia -Patient's Albumin Trend: Recent Labs  Lab 12/28/22 1147 12/28/22 2155 12/29/22 0107 12/30/22 0707 12/31/22 0618 01/01/23 0500 01/02/23 0341  ALBUMIN 3.0* 2.5* 2.5* 2.2* 2.4* 2.3* 2.3*  -Continue to Monitor and Trend and repeat CMP in the AM  Super Morbid Obesity -Complicates overall prognosis and care -Estimated body mass index is 81.14 kg/m as calculated from the following:   Height as of this encounter: 5\' 7"  (1.702 m).   Weight as of this encounter: 235 kg.  -Weight Loss and Dietary Counseling given   DVT prophylaxis: heparin injection 5,000 Units Start: 12/29/22 1430 SCD's Start: 12/21/22 1116    Code Status: Full Code Family Communication: No family currently at bedside  Disposition Plan:  Level of care: Progressive Status is: Inpatient Remains inpatient appropriate because: Needs further clinical improvement and disposition per primary   Consultants:  General Surgery is primary TRH PCCM  Procedures:  As delineated as above  Antimicrobials:  Anti-infectives (From admission, onward)    Start     Dose/Rate Route Frequency Ordered Stop   01/01/23 2000  ceFEPIme (MAXIPIME) 2 g in sodium chloride 0.9 % 100 mL IVPB        2 g 200 mL/hr over 30 Minutes Intravenous  Every 8 hours 01/01/23 1311     12/31/22 1245  linezolid (ZYVOX) IVPB 600 mg  Status:  Discontinued        600 mg 300 mL/hr over 60 Minutes Intravenous 2 times daily 12/31/22 1145 01/02/23 1146   12/29/22 2200  ceFEPIme (MAXIPIME) 2 g in sodium chloride 0.9 % 100 mL IVPB  Status:  Discontinued        2 g 200 mL/hr over 30 Minutes Intravenous Every 12 hours 12/28/22 2127 12/29/22 1401   12/29/22 1500  ceFEPIme (MAXIPIME) 2 g in sodium chloride 0.9 % 100 mL IVPB  Status:  Discontinued        2 g 200 mL/hr over 30 Minutes Intravenous 2 times daily 12/29/22 1401 01/01/23 1311   12/29/22 1215  linezolid (ZYVOX) IVPB 600 mg  Status:  Discontinued        600 mg 300 mL/hr over 60 Minutes Intravenous Every 12 hours 12/29/22 1120 12/31/22 1145   12/29/22 0400  vancomycin (VANCOREADY) IVPB 1250 mg/250 mL  Status:  Discontinued        1,250 mg 166.7 mL/hr over 90 Minutes Intravenous Every 12 hours 12/28/22 1512 12/28/22 2127   12/28/22 2126  vancomycin variable dose per unstable renal function (pharmacist dosing)  Status:  Discontinued         Does not apply See admin instructions 12/28/22 2127 12/29/22 1120  12/28/22 2100  ceFEPIme (MAXIPIME) 2 g in sodium chloride 0.9 % 100 mL IVPB  Status:  Discontinued        2 g 200 mL/hr over 30 Minutes Intravenous Every 8 hours 12/28/22 1512 12/28/22 2127   12/28/22 2100  metroNIDAZOLE (FLAGYL) IVPB 500 mg        500 mg 100 mL/hr over 60 Minutes Intravenous Every 12 hours 12/28/22 1512     12/28/22 1600  vancomycin (VANCOREADY) IVPB 2000 mg/400 mL        2,000 mg 200 mL/hr over 120 Minutes Intravenous  Once 12/28/22 1512 12/28/22 1752   12/28/22 1230  piperacillin-tazobactam (ZOSYN) IVPB 3.375 g  Status:  Discontinued        3.375 g 12.5 mL/hr over 240 Minutes Intravenous Every 8 hours 12/28/22 1116 12/28/22 1503   12/21/22 0707  ceFAZolin (ANCEF) 3-0.9 GM/100ML-% IVPB       Note to Pharmacy: Blair Promise B: cabinet override      12/21/22 0707  12/21/22 1914   12/21/22 0600  ceFAZolin (ANCEF) IVPB 2g/100 mL premix  Status:  Discontinued        2 g 200 mL/hr over 30 Minutes Intravenous On call to O.R. 12/21/22 0541 12/21/22 1058       Subjective: Seen and examined at bedside and thinks he is doing okay.  Renal function is improved.  Thinks his breathing is not as bad.  No nausea or vomiting.  Nursing reported that he had some transient bradycardia and a questionable pause.  Will need to continue monitor carefully.  No other concerns or complaints this time and he is improving slowly.  Objective: Vitals:   01/03/23 1600 01/03/23 1605 01/03/23 1610 01/03/23 1801  BP: (!) 174/89   (!) 166/85  Pulse: 99 96 98 (!) 102  Resp: (!) 26 (!) 26 (!) 26 20  Temp: (!) 100.6 F (38.1 C) (!) 100.6 F (38.1 C) (!) 100.6 F (38.1 C) 98.7 F (37.1 C)  TempSrc:    Oral  SpO2: 97% 96% 97% 98%  Weight:      Height:        Intake/Output Summary (Last 24 hours) at 01/03/2023 1927 Last data filed at 01/03/2023 1800 Gross per 24 hour  Intake 2423.83 ml  Output 4725 ml  Net -2301.17 ml   Filed Weights   12/21/22 0543  Weight: (!) 235 kg   Examination: Physical Exam:  Constitutional: WN/WD morbidly obese Caucasian male who appears calm Respiratory: Diminished to auscultation bilaterally with some coarse breath sounds, no wheezing, rales, rhonchi or crackles. Normal respiratory effort and patient is not tachypenic. No accessory muscle use.  Unlabored breathing Cardiovascular: RRR, no murmurs / rubs / gallops. S1 and S2 auscultated.  Has mild 1+ lower extremity edema Abdomen: Soft, non-tender, distended secondary to body habitus. Bowel sounds positive.  Has a biliary drain in place GU: Deferred. Musculoskeletal: No clubbing / cyanosis of digits/nails. No joint deformity upper and lower extremities. Skin: No rashes, lesions, ulcers on limited skin evaluation. No induration; Warm and dry.  Neurologic: CN 2-12 grossly intact with no focal  deficits. Romberg sign and cerebellar reflexes not assessed.  Psychiatric: Normal judgment and insight. Alert and oriented x 3. Normal mood and appropriate affect.   Data Reviewed: I have personally reviewed following labs and imaging studies  CBC: Recent Labs  Lab 12/28/22 1147 12/28/22 2112 12/29/22 0014 12/30/22 0707 12/31/22 0618 01/01/23 0500 01/02/23 0341 01/03/23 0325  WBC 21.9* 31.8*   < >  25.7* 30.9* 25.0* 20.4* 15.9*  NEUTROABS 20.5* 28.5*  --   --   --   --   --   --   HGB 13.5 11.7*   < > 10.3* 10.5* 10.3* 10.3* 11.1*  HCT 44.1 40.6   < > 33.2* 33.9* 33.9* 33.4* 35.9*  MCV 93.0 96.7   < > 91.7 92.4 91.9 90.5 90.0  PLT 324 316   < > 203 190 169 177 177   < > = values in this interval not displayed.   Basic Metabolic Panel: Recent Labs  Lab 12/29/22 0107 12/29/22 0129 12/30/22 0707 12/31/22 0618 01/01/23 0500 01/02/23 0341 01/03/23 0324 01/03/23 0325  NA 133*   < > 134* 132* 134* 140  --  138  K 4.7   < > 4.1 4.6 3.8 3.5  --  3.0*  CL 97*  --  99 98 98 103  --  101  CO2 21*  --  19* 21* 22 25  --  28  GLUCOSE 138*  --  107* 127* 101* 111*  --  145*  BUN 28*  --  44* 58* 64* 58*  --  43*  CREATININE 3.08*  --  3.59* 3.45* 2.39* 1.34*  --  0.72  CALCIUM 7.7*  --  7.4* 8.0* 8.2* 8.2*  --  7.9*  MG 1.5*  --  1.8  --   --   --  2.1  --   PHOS 4.7*  --  4.8*  --   --   --  1.8*  --    < > = values in this interval not displayed.   GFR: Estimated Creatinine Clearance: 239.1 mL/min (by C-G formula based on SCr of 0.72 mg/dL). Liver Function Tests: Recent Labs  Lab 12/29/22 0107 12/30/22 0707 12/31/22 0618 01/01/23 0500 01/02/23 0341  AST 34 22 20 17  14*  ALT 38 32 34 34 28  ALKPHOS 83 79 105 110 106  BILITOT 1.7* 0.9 1.0 0.7 0.8  PROT 6.5 5.6* 6.2* 6.2* 6.0*  ALBUMIN 2.5* 2.2* 2.4* 2.3* 2.3*   Recent Labs  Lab 12/29/22 0107 12/30/22 0938  LIPASE 15 14   No results for input(s): "AMMONIA" in the last 168 hours. Coagulation Profile: Recent Labs   Lab 12/28/22 1147 12/30/22 0707  INR 1.2 1.1   Cardiac Enzymes: No results for input(s): "CKTOTAL", "CKMB", "CKMBINDEX", "TROPONINI" in the last 168 hours. BNP (last 3 results) No results for input(s): "PROBNP" in the last 8760 hours. HbA1C: No results for input(s): "HGBA1C" in the last 72 hours. CBG: Recent Labs  Lab 01/02/23 2330 01/03/23 0333 01/03/23 0745 01/03/23 1132 01/03/23 1602  GLUCAP 117* 138* 153* 141* 127*   Lipid Profile: No results for input(s): "CHOL", "HDL", "LDLCALC", "TRIG", "CHOLHDL", "LDLDIRECT" in the last 72 hours. Thyroid Function Tests: No results for input(s): "TSH", "T4TOTAL", "FREET4", "T3FREE", "THYROIDAB" in the last 72 hours. Anemia Panel: No results for input(s): "VITAMINB12", "FOLATE", "FERRITIN", "TIBC", "IRON", "RETICCTPCT" in the last 72 hours. Sepsis Labs: Recent Labs  Lab 12/28/22 1636 12/28/22 2039 12/29/22 0107 12/29/22 0450  PROCALCITON  --   --  >150.00  --   LATICACIDVEN 2.9* 2.7* 2.4* 2.0*   Recent Results (from the past 240 hour(s))  MRSA Next Gen by PCR, Nasal     Status: Abnormal   Collection Time: 12/28/22 11:23 AM   Specimen: Nasal Mucosa; Nasal Swab  Result Value Ref Range Status   MRSA by PCR Next Gen DETECTED (A) NOT DETECTED Final  Comment: RESULT CALLED TO, READ BACK BY AND VERIFIED WITH: B.FOLEY, RN AT 1543 ON 09.12.24 BY N.THOMPSON (NOTE) The GeneXpert MRSA Assay (FDA approved for NASAL specimens only), is one component of a comprehensive MRSA colonization surveillance program. It is not intended to diagnose MRSA infection nor to guide or monitor treatment for MRSA infections. Test performance is not FDA approved in patients less than 2 years old. Performed at [facility], [address], [place], [place] [address]   Culture, blood (x 2)     Status: None   Collection Time: 12/28/22 11:47 AM   Specimen: BLOOD RIGHT ARM  Result Value Ref Range Status   Specimen Description   Final     BLOOD RIGHT ARM Performed at [facility], [address] [place], [place] [address]    Special Requests   Final    BOTTLES DRAWN AEROBIC AND ANAEROBIC Blood Culture adequate volume Performed at [facility], [address], [place], [place] [address]    Culture   Final    NO GROWTH 5 DAYS Performed at [facility], [address], [place], [place] [address]    Report Status 01/02/2023 FINAL  Final  Culture, blood (x 2)     Status: Abnormal   Collection Time: 12/28/22 11:47 AM   Specimen: BLOOD RIGHT HAND  Result Value Ref Range Status   Specimen Description   Final    BLOOD RIGHT HAND Performed at [facility], [address] [place], [place] [address]    Special Requests   Final    BOTTLES DRAWN AEROBIC ONLY Blood Culture adequate volume Performed at [facility], [address] [place], [place] [address]    Culture  Setup Time   Final    GRAM POSITIVE COCCI IN CLUSTERS AEROBIC BOTTLE ONLY CRITICAL RESULT CALLED TO, READ BACK BY AND VERIFIED WITH: PHARMD N.GOOGOVAC AT 1401 ON 12/29/2022 BY T.SAAD.    Culture (A)  Final    STAPHYLOCOCCUS EPIDERMIDIS THE SIGNIFICANCE OF ISOLATING THIS ORGANISM FROM A SINGLE SET OF BLOOD CULTURES WHEN MULTIPLE SETS ARE DRAWN IS UNCERTAIN. PLEASE NOTIFY THE MICROBIOLOGY DEPARTMENT WITHIN ONE WEEK IF SPECIATION AND SENSITIVITIES ARE REQUIRED. Performed at [facility], [address] [place], [place] [address]    Report Status 12/30/2022 FINAL  Final  Blood Culture ID Panel (Reflexed)     Status: Abnormal   Collection Time: 12/28/22 11:47 AM  Result Value Ref Range Status   Enterococcus faecalis NOT DETECTED NOT DETECTED Final   Enterococcus Faecium NOT DETECTED NOT DETECTED Final   Listeria monocytogenes NOT DETECTED NOT DETECTED Final   Staphylococcus species DETECTED (A) NOT DETECTED Final    Comment: CRITICAL RESULT CALLED TO, READ BACK BY AND VERIFIED WITH: PHARMD  N.GOOGOVAC AT 1401 ON 12/29/2022 BY T.SAAD.    Staphylococcus aureus (BCID) NOT DETECTED NOT DETECTED Final   Staphylococcus epidermidis DETECTED (A) NOT DETECTED Final    Comment: CRITICAL RESULT CALLED TO, READ BACK BY AND VERIFIED WITH: PHARMD N.GOOGOVAC AT 1401 ON 12/29/2022 BY T.SAAD.    Staphylococcus lugdunensis NOT DETECTED NOT DETECTED Final   Streptococcus species NOT DETECTED NOT DETECTED Final   Streptococcus agalactiae NOT DETECTED NOT DETECTED Final   Streptococcus pneumoniae NOT DETECTED NOT DETECTED Final   Streptococcus pyogenes NOT DETECTED NOT DETECTED Final   A.calcoaceticus-baumannii NOT DETECTED NOT DETECTED Final   Bacteroides fragilis NOT DETECTED NOT DETECTED Final   Enterobacterales NOT DETECTED NOT DETECTED  Final   Enterobacter cloacae complex NOT DETECTED NOT DETECTED Final   Escherichia coli NOT DETECTED NOT DETECTED Final   Klebsiella aerogenes NOT DETECTED NOT DETECTED Final   Klebsiella oxytoca NOT DETECTED NOT DETECTED Final   Klebsiella pneumoniae NOT DETECTED NOT DETECTED Final   Proteus species NOT DETECTED NOT DETECTED Final   Salmonella species NOT DETECTED NOT DETECTED Final   Serratia marcescens NOT DETECTED NOT DETECTED Final   Haemophilus influenzae NOT DETECTED NOT DETECTED Final   Neisseria meningitidis NOT DETECTED NOT DETECTED Final   Pseudomonas aeruginosa NOT DETECTED NOT DETECTED Final   Stenotrophomonas maltophilia NOT DETECTED NOT DETECTED Final   Candida albicans NOT DETECTED NOT DETECTED Final   Candida auris NOT DETECTED NOT DETECTED Final   Candida glabrata NOT DETECTED NOT DETECTED Final   Candida krusei NOT DETECTED NOT DETECTED Final   Candida parapsilosis NOT DETECTED NOT DETECTED Final   Candida tropicalis NOT DETECTED NOT DETECTED Final   Cryptococcus neoformans/gattii NOT DETECTED NOT DETECTED Final   Methicillin resistance mecA/C NOT DETECTED NOT DETECTED Final    Comment: Performed at [facility], [address], [place], [place] [address]    Radiology Studies: No results found.  Scheduled Meds:  allopurinol  100 mg Oral Daily   bisacodyl  10 mg Rectal Daily   Chlorhexidine Gluconate Cloth  6 each Topical Daily   gabapentin  300 mg Oral QID   Gerhardt's butt cream   Topical BID   heparin injection (subcutaneous)  5,000 Units Subcutaneous Q8H   hydrocortisone  1 Application Rectal BID   insulin aspart  0-20 Units Subcutaneous Q4H   lidocaine  1 patch Transdermal Daily   nystatin   Topical BID   pantoprazole (PROTONIX) IV  40 mg Intravenous Q12H   Ensure Max Protein  11 oz Oral Daily   Continuous Infusions:  sodium chloride Stopped (12/29/22 0059)   sodium chloride Stopped (01/03/23 1522)   ceFEPime (MAXIPIME) IV Stopped (01/03/23 1221)   methocarbamol (ROBAXIN) IV Stopped (01/02/23 0306)   metronidazole Stopped (01/03/23 0910)   ondansetron (ZOFRAN) IV      LOS: 12 days   Marguerita Merles, DO [facility] Available via Epic secure chat 7am-7pm After these hours, please refer to coverage provider listed on [email or web address] 01/03/2023, 7:27 PM

## 2023-01-03 NOTE — Progress Notes (Signed)
13 Days Post-Op  Subjective: Pain slightly better, tolerated liquids well  ROS: See above, otherwise other systems negative  Objective: Vital signs in last 24 hours: Temp:  [98.2 F (36.8 C)-99.9 F (37.7 C)] 99.5 F (37.5 C) (09/18 0600) Pulse Rate:  [81-105] 92 (09/18 0600) Resp:  [15-23] 17 (09/18 0600) BP: (140-171)/(55-89) 153/69 (09/18 0600) SpO2:  [89 %-99 %] 96 % (09/18 0600) Last BM Date : 01/02/23  Intake/Output from previous day: 09/17 0701 - 09/18 0700 In: 1400.8 [P.O.:630; I.V.:51.9; IV Piggyback:719] Out: 6535 [Urine:5500; Drains:110; Stool:925] Intake/Output this shift: No intake/output data recorded.  PE: Gen: NAD Resp: nonlabored CV: RRR Abd: soft, incisions c/d/I, bilious drainage (110 in 24 h)  Lab Results:  Recent Labs    01/02/23 0341 01/03/23 0325  WBC 20.4* 15.9*  HGB 10.3* 11.1*  HCT 33.4* 35.9*  PLT 177 177   BMET Recent Labs    01/02/23 0341 01/03/23 0325  NA 140 138  K 3.5 3.0*  CL 103 101  CO2 25 28  GLUCOSE 111* 145*  BUN 58* 43*  CREATININE 1.34* 0.72  CALCIUM 8.2* 7.9*   PT/INR No results for input(s): "LABPROT", "INR" in the last 72 hours. CMP     Component Value Date/Time   NA 138 01/03/2023 0325   NA 139 11/14/2012 0000   K 3.0 (L) 01/03/2023 0325   K 4.1 11/14/2012 0000   CL 101 01/03/2023 0325   CO2 28 01/03/2023 0325   GLUCOSE 145 (H) 01/03/2023 0325   BUN 43 (H) 01/03/2023 0325   BUN 11 11/14/2012 0000   CREATININE 0.72 01/03/2023 0325   CREATININE 0.62 11/14/2012 0000   CALCIUM 7.9 (L) 01/03/2023 0325   PROT 6.0 (L) 01/02/2023 0341   ALBUMIN 2.3 (L) 01/02/2023 0341   ALBUMIN 2.6 11/14/2012 0000   AST 14 (L) 01/02/2023 0341   ALT 28 01/02/2023 0341   ALKPHOS 106 01/02/2023 0341   BILITOT 0.8 01/02/2023 0341   GFRNONAA >60 01/03/2023 0325   GFRAA >60 12/30/2017 0619   Lipase     Component Value Date/Time   LIPASE 14 12/30/2022 0938    Studies/Results: No results  found.  Anti-infectives: Anti-infectives (From admission, onward)    Start     Dose/Rate Route Frequency Ordered Stop   01/01/23 2000  ceFEPIme (MAXIPIME) 2 g in sodium chloride 0.9 % 100 mL IVPB        2 g 200 mL/hr over 30 Minutes Intravenous Every 8 hours 01/01/23 1311     12/31/22 1245  linezolid (ZYVOX) IVPB 600 mg  Status:  Discontinued        600 mg 300 mL/hr over 60 Minutes Intravenous 2 times daily 12/31/22 1145 01/02/23 1146   12/29/22 2200  ceFEPIme (MAXIPIME) 2 g in sodium chloride 0.9 % 100 mL IVPB  Status:  Discontinued        2 g 200 mL/hr over 30 Minutes Intravenous Every 12 hours 12/28/22 2127 12/29/22 1401   12/29/22 1500  ceFEPIme (MAXIPIME) 2 g in sodium chloride 0.9 % 100 mL IVPB  Status:  Discontinued        2 g 200 mL/hr over 30 Minutes Intravenous 2 times daily 12/29/22 1401 01/01/23 1311   12/29/22 1215  linezolid (ZYVOX) IVPB 600 mg  Status:  Discontinued        600 mg 300 mL/hr over 60 Minutes Intravenous Every 12 hours 12/29/22 1120 12/31/22 1145   12/29/22 0400  vancomycin (VANCOREADY) IVPB 1250 mg/250  mL  Status:  Discontinued        1,250 mg 166.7 mL/hr over 90 Minutes Intravenous Every 12 hours 12/28/22 1512 12/28/22 2127   12/28/22 2126  vancomycin variable dose per unstable renal function (pharmacist dosing)  Status:  Discontinued         Does not apply See admin instructions 12/28/22 2127 12/29/22 1120   12/28/22 2100  ceFEPIme (MAXIPIME) 2 g in sodium chloride 0.9 % 100 mL IVPB  Status:  Discontinued        2 g 200 mL/hr over 30 Minutes Intravenous Every 8 hours 12/28/22 1512 12/28/22 2127   12/28/22 2100  metroNIDAZOLE (FLAGYL) IVPB 500 mg        500 mg 100 mL/hr over 60 Minutes Intravenous Every 12 hours 12/28/22 1512     12/28/22 1600  vancomycin (VANCOREADY) IVPB 2000 mg/400 mL        2,000 mg 200 mL/hr over 120 Minutes Intravenous  Once 12/28/22 1512 12/28/22 1752   12/28/22 1230  piperacillin-tazobactam (ZOSYN) IVPB 3.375 g  Status:   Discontinued        3.375 g 12.5 mL/hr over 240 Minutes Intravenous Every 8 hours 12/28/22 1116 12/28/22 1503   12/21/22 0707  ceFAZolin (ANCEF) 3-0.9 GM/100ML-% IVPB       Note to Pharmacy: Blair Promise B: cabinet override      12/21/22 0707 12/21/22 1914   12/21/22 0600  ceFAZolin (ANCEF) IVPB 2g/100 mL premix  Status:  Discontinued        2 g 200 mL/hr over 30 Minutes Intravenous On call to O.R. 12/21/22 0541 12/21/22 1058       Assessment/Plan  S/p partial cholecystectomy with bilious leak, sepsis now resolving   FEN - carb modified VTE - continue heparin ID - cefepime vanc Dispo - in stepdown, given previous events and poor baseline I think this is best place for him, advancing diet, if drain stays relatively low output and controlled getting closer to discharge  I reviewed last 24 h vitals and pain scores, last 48 h intake and output, last 24 h labs and trends, and last 24 h imaging results.  This care required moderate level of medical decision making.    LOS: 12 days   De Blanch [facility] 01/03/2023, 8:59 AM Please see Amion for pager number during day hours 7:00am-4:30pm or 7:00am -11:30am on weekends

## 2023-01-03 NOTE — Progress Notes (Addendum)
   01/03/23 2014  BiPAP/CPAP/SIPAP  BiPAP/CPAP/SIPAP Pt Type Adult  Reason BIPAP/CPAP not in use Non-compliant (pt refused)   Pt continues to decline bipap at night.  Pt stated he doesn't like the feel of it and does not want to wear it while here in the hospital.  RN aware.

## 2023-01-03 NOTE — TOC Progression Note (Signed)
Transition of Care [facility]) - Progression Note    Patient Details  Name: [name] MRN: [id number] Date of Birth: November 24, 1985  Transition of Care [facility]) CM/SW Contact  Amada Jupiter, LCSW Phone Number: 01/03/2023, 12:49 PM  Clinical Narrative:    Alerted by Dr. Sheliah Hatch today that pt may be approaching medical readiness to return to SNF at [facility] (possibly by Friday?).  Have alerted facility and patient - aware and agreeable.  Will continue to follow to coordinate pt's return.   Expected Discharge Plan: Long Term Nursing Home Barriers to Discharge: Continued Medical Work up  Expected Discharge Plan and Services In-house Referral: Clinical Social Work   Post Acute Care Choice: Nursing Home Living arrangements for the past 2 months: Skilled Nursing Facility                                       Social Determinants of Health (SDOH) Interventions SDOH Screenings   Food Insecurity: Patient Declined (12/23/2022)  Housing: Patient Declined (08/17/2022)  Transportation Needs: No Transportation Needs (08/17/2022)  Utilities: Not At Risk (08/17/2022)  Tobacco Use: High Risk (12/21/2022)    Readmission Risk Interventions    12/26/2022    4:01 PM  Readmission Risk Prevention Plan  Post Dischage Appt Complete  Medication Screening Complete  Transportation Screening Complete

## 2023-01-04 ENCOUNTER — Inpatient Hospital Stay (HOSPITAL_COMMUNITY): Payer: Medicaid Other

## 2023-01-04 DIAGNOSIS — K8010 Calculus of gallbladder with chronic cholecystitis without obstruction: Secondary | ICD-10-CM | POA: Diagnosis not present

## 2023-01-04 DIAGNOSIS — K219 Gastro-esophageal reflux disease without esophagitis: Secondary | ICD-10-CM | POA: Diagnosis not present

## 2023-01-04 DIAGNOSIS — N179 Acute kidney failure, unspecified: Secondary | ICD-10-CM | POA: Diagnosis not present

## 2023-01-04 DIAGNOSIS — J9601 Acute respiratory failure with hypoxia: Secondary | ICD-10-CM | POA: Diagnosis not present

## 2023-01-04 DIAGNOSIS — R509 Fever, unspecified: Secondary | ICD-10-CM

## 2023-01-04 LAB — CBC WITH DIFFERENTIAL/PLATELET
Abs Immature Granulocytes: 2.03 10*3/uL — ABNORMAL HIGH (ref 0.00–0.07)
Basophils Absolute: 0.1 10*3/uL (ref 0.0–0.1)
Basophils Relative: 1 %
Eosinophils Absolute: 0.2 10*3/uL (ref 0.0–0.5)
Eosinophils Relative: 1 %
HCT: 36.4 % — ABNORMAL LOW (ref 39.0–52.0)
Hemoglobin: 11.4 g/dL — ABNORMAL LOW (ref 13.0–17.0)
Immature Granulocytes: 12 %
Lymphocytes Relative: 14 %
Lymphs Abs: 2.4 10*3/uL (ref 0.7–4.0)
MCH: 28.4 pg (ref 26.0–34.0)
MCHC: 31.3 g/dL (ref 30.0–36.0)
MCV: 90.8 fL (ref 80.0–100.0)
Monocytes Absolute: 1.9 10*3/uL — ABNORMAL HIGH (ref 0.1–1.0)
Monocytes Relative: 11 %
Neutro Abs: 11 10*3/uL — ABNORMAL HIGH (ref 1.7–7.7)
Neutrophils Relative %: 61 %
Platelets: 205 10*3/uL (ref 150–400)
RBC: 4.01 MIL/uL — ABNORMAL LOW (ref 4.22–5.81)
RDW: 14 % (ref 11.5–15.5)
WBC: 17.7 10*3/uL — ABNORMAL HIGH (ref 4.0–10.5)
nRBC: 0.1 % (ref 0.0–0.2)

## 2023-01-04 LAB — GLUCOSE, CAPILLARY
Glucose-Capillary: 115 mg/dL — ABNORMAL HIGH (ref 70–99)
Glucose-Capillary: 122 mg/dL — ABNORMAL HIGH (ref 70–99)
Glucose-Capillary: 123 mg/dL — ABNORMAL HIGH (ref 70–99)
Glucose-Capillary: 125 mg/dL — ABNORMAL HIGH (ref 70–99)
Glucose-Capillary: 132 mg/dL — ABNORMAL HIGH (ref 70–99)
Glucose-Capillary: 136 mg/dL — ABNORMAL HIGH (ref 70–99)
Glucose-Capillary: 97 mg/dL (ref 70–99)

## 2023-01-04 LAB — COMPREHENSIVE METABOLIC PANEL
ALT: 16 U/L (ref 0–44)
AST: 12 U/L — ABNORMAL LOW (ref 15–41)
Albumin: 2.1 g/dL — ABNORMAL LOW (ref 3.5–5.0)
Alkaline Phosphatase: 68 U/L (ref 38–126)
Anion gap: 9 (ref 5–15)
BUN: 22 mg/dL — ABNORMAL HIGH (ref 6–20)
CO2: 27 mmol/L (ref 22–32)
Calcium: 7.6 mg/dL — ABNORMAL LOW (ref 8.9–10.3)
Chloride: 100 mmol/L (ref 98–111)
Creatinine, Ser: 0.45 mg/dL — ABNORMAL LOW (ref 0.61–1.24)
GFR, Estimated: >60 mL/min (ref >60)
Glucose, Bld: 117 mg/dL — ABNORMAL HIGH (ref 70–99)
Potassium: 3.2 mmol/L — ABNORMAL LOW (ref 3.5–5.1)
Sodium: 136 mmol/L (ref 135–145)
Total Bilirubin: 0.8 mg/dL (ref 0.3–1.2)
Total Protein: 5.6 g/dL — ABNORMAL LOW (ref 6.5–8.1)

## 2023-01-04 LAB — PROCALCITONIN: Procalcitonin: 3.33 ng/mL

## 2023-01-04 LAB — PHOSPHORUS: Phosphorus: 1.9 mg/dL — ABNORMAL LOW (ref 2.5–4.6)

## 2023-01-04 LAB — LACTIC ACID, PLASMA: Lactic Acid, Venous: 0.8 mmol/L (ref 0.5–1.9)

## 2023-01-04 LAB — MAGNESIUM: Magnesium: 1.5 mg/dL — ABNORMAL LOW (ref 1.7–2.4)

## 2023-01-04 MED ORDER — COLCHICINE 0.6 MG PO TABS
0.6000 mg | ORAL_TABLET | Freq: Every day | ORAL | Status: DC
  Administered 2023-01-04 – 2023-01-12 (×9): 0.6 mg via ORAL
  Filled 2023-01-04 (×9): qty 1

## 2023-01-04 MED ORDER — POTASSIUM CHLORIDE CRYS ER 20 MEQ PO TBCR
40.0000 meq | EXTENDED_RELEASE_TABLET | Freq: Once | ORAL | Status: AC
  Administered 2023-01-04: 40 meq via ORAL
  Filled 2023-01-04: qty 2

## 2023-01-04 MED ORDER — IOHEXOL 9 MG/ML PO SOLN
500.0000 mL | ORAL | Status: AC
  Administered 2023-01-04: 500 mL via ORAL

## 2023-01-04 MED ORDER — MAGNESIUM SULFATE 4 GM/100ML IV SOLN
4.0000 g | Freq: Once | INTRAVENOUS | Status: AC
  Administered 2023-01-04: 4 g via INTRAVENOUS
  Filled 2023-01-04: qty 100

## 2023-01-04 MED ORDER — GUAIFENESIN ER 600 MG PO TB12
1200.0000 mg | ORAL_TABLET | Freq: Two times a day (BID) | ORAL | Status: DC
  Administered 2023-01-04 – 2023-01-09 (×10): 1200 mg via ORAL
  Filled 2023-01-04 (×12): qty 2

## 2023-01-04 MED ORDER — FUROSEMIDE 10 MG/ML IJ SOLN
40.0000 mg | Freq: Once | INTRAMUSCULAR | Status: AC
  Administered 2023-01-04: 40 mg via INTRAVENOUS
  Filled 2023-01-04: qty 4

## 2023-01-04 MED ORDER — POTASSIUM PHOSPHATES 15 MMOLE/5ML IV SOLN
30.0000 mmol | Freq: Once | INTRAVENOUS | Status: AC
  Administered 2023-01-04: 30 mmol via INTRAVENOUS
  Filled 2023-01-04: qty 10

## 2023-01-04 NOTE — TOC Progression Note (Signed)
Transition of Care [facility]) - Progression Note    Patient Details  Name: [name] MRN: [id number] Date of Birth: [birth year]  Transition of Care [facility]) CM/SW Contact  Howell Rucks, RN Phone Number: 01/04/2023, 10:18 AM  Clinical Narrative: PT eval, await recommendation. TOC will continue to follow.        Expected Discharge Plan: Long Term Nursing Home Barriers to Discharge: Continued Medical Work up  Expected Discharge Plan and Services In-house Referral: Clinical Social Work   Post Acute Care Choice: Nursing Home Living arrangements for the past 2 months: Skilled Nursing Facility                                       Social Determinants of Health (SDOH) Interventions SDOH Screenings   Food Insecurity: Patient Declined (12/23/2022)  Housing: Patient Declined (08/17/2022)  Transportation Needs: No Transportation Needs (08/17/2022)  Utilities: Not At Risk (08/17/2022)  Tobacco Use: High Risk (12/21/2022)    Readmission Risk Interventions    12/26/2022    4:01 PM  Readmission Risk Prevention Plan  Post Dischage Appt Complete  Medication Screening Complete  Transportation Screening Complete

## 2023-01-04 NOTE — Progress Notes (Addendum)
PROGRESS NOTE    [name]  HQI:[id number] DOB: 09-16-1985 DOA: 12/21/2022 PCP: Toma Deiters, MD   Brief Narrative:  The Patient [name] is a 37 y/o male with obesity, NASH who presented with acute cholecystitis requiring cholecystectomy, which was complicated by his hepatomegaly. He had a bile leak post-op requiring a drain  Significant Hospital Events:    9/5 - Underwent elective lap chole with CCS. Partial cholecystectomy completed in the setting of poor visualization. EBL . Drain left in place. 9/12 - Transferred to SDU for worsening hypoxia, tachypnea, tachycardia. Hypotensive. CT Chest/A/P with postoperative changes, surgical drain in gallbladder fossa, minimal L basilar subsegmental atelectasis, L adrenal adenoma. TRH consulted. Fluids/Zosyn given, broadened to vanc/cefepime/Flagyl. Refused BiPAP/stated he would refuse intubation even if it meant death. Made DNR. Later in shift more hypotensive, PCCM consulted for pressors. Code status changed back to full code. Vasopressors initiated. 9/13 - A-line, LIJ CVC placed. High pressor requirements with NE 38, Neo 70, vaso 0.03. 9/16: neo stopped 9/13. NE and vaso stopped 9/15. Duoneb for wheezing. A-line removed. Transfer to SDU.  9/17: CCM requested TRH to continue to follow for medical management.  Surgery remains primary service.  9/18 -Had some Transient Bradycardia and ? Pause; will replete electrolytes and continue to monitor.  Patient's renal function is improved  9/19 -Spiked a temperature yesterday and so we will obtain blood cultures x 2 and workup with fever given that WBC slightly worsened to 17.7.   Assessment and Plan:  Chronic calculous cholecystitis, sepsis from biliary source, improving slowly Biliary leak, post operatively.  -Currently off vasopressors, sepsis physiology improving but still spiking intermittent temp, BP now elevated -WBC Trend: Recent Labs  Lab 12/29/22 0107 12/30/22 0707  12/31/22 0618 01/01/23 0500 01/02/23 0341 01/03/23 0325 01/04/23 0445  WBC 41.1* 25.7* 30.9* 25.0* 20.4* 15.9* 17.7*  -On broad-spectrum antibiotics, management per general surgery -Continues to spike some intermittent temperatures and had a Tmax of 100.6 today will need to continue monitor carefully and ordered Blood Cx x2; Agree with Surgery obtaining U/A, CXR, and repeat CT Scan of the Abdomen and Pelvis -Procalcitonin level was 3.33 however on admission it was greater than 150 -Seen by GI this admission, plan and timing for ERCP per GI -Blood cultures 1/2 staph epi, currently on Cefepime and Flagyl; blood cultures x 2 being reobtained -Biliary drain in place -Further care per primary General Surgery with pain control -Patient is on a liquid diet today and want to advance his diet   Acute on chronic respiratory failure with hypoxia -Multifactorial secondary to aggressive fluid resuscitation during shock, OSA, OHS - BiPAP as needed, received IV Lasix 40 mg x 1 on 9/16 (per PCCM) for fluid overload -Still positive balance of 8.573 L -BP currently stable, given Lasix 40 mg IVx1 yesterday on 9/17 and will hold today -Reorder a dose of IV Lasix 40 mg x1 SpO2: 95 % O2 Flow Rate (L/min): 2 L/min FiO2 (%): 35 % -Continuous Pulse Oximetry and maintain O2 saturation greater than 90% -Continue supplemental oxygen via nasal cannula and wean oxygen to room air -Repeat CXR done and showed "Increasing streaky bibasilar opacities, which may represent atelectasis or infection. Cardiomegaly with pulmonary vascular congestion." -Initiate Flutter Valve, Incentive Spirometry, Guaifenesin 1200 po BID -Continue to monitor oxygen requirements as necessary   Electrolyte Abnormalities including Hypokalemia and Hypophosphatemia and Hypomagnesemia -K, Mag, and Phos Level Trend: Recent Labs  Lab 12/29/22 0129 12/30/22 0707 12/31/22 0618 01/01/23 0500 01/02/23 0341 01/03/23 0324  01/03/23 0325  01/04/23 0445  K 4.6 4.1 4.6 3.8 3.5  --  3.0* 3.2*  MG  --  1.8  --   --   --    < >  --  1.5*  PHOS  --  4.8*  --   --   --    < >  --  1.9*   < > = values in this interval not displayed.  -Replete with IV K Phos 30 mmol, IV Mag Sulfate 4 grams, and po KCL 40 mEQ BID -Conitnue To monitor replete as necessary repeat CMP in a.m. along with Phos p.o.  OHS/OSA -Largely related to habitus, morbid obesity -BiPAP as above  AKI, improved and resolved  -In the setting of hypotension, sepsis.  Likely ATN and combination of obstructive physiology, unable to pass urine prior to Foley placement.   - Status post Foley placement with a cystoscopy 9/14 by Urology -BUN/Cr Trend: Recent Labs  Lab 12/29/22 0107 12/30/22 0707 12/31/22 0618 01/01/23 0500 01/02/23 0341 01/03/23 0325 01/04/23 0445  BUN 28* 44* 58* 64* 58* 43* 22*  CREATININE 3.08* 3.59* 3.45* 2.39* 1.34* 0.72 0.45*  -Avoid Nephrotoxic Medications, Contrast Dyes, Hypotension and Dehydration to Ensure Adequate Renal Perfusion and will need to Renally Adjust Meds -Follow Renal Fxn Closely with Diuresis  -Continue to Monitor and Trend Renal Function carefully and repeat CMP in the AM   Normocytic Anemia -Hgb/Hct Trend: Recent Labs  Lab 12/29/22 0129 12/30/22 0707 12/31/22 0618 01/01/23 0500 01/02/23 0341 01/03/23 0325 01/04/23 0445  HGB 12.9* 10.3* 10.5* 10.3* 10.3* 11.1* 11.4*  HCT 38.0* 33.2* 33.9* 33.9* 33.4* 35.9* 36.4*  MCV  --  91.7 92.4 91.9 90.5 90.0 90.8  -Check Anemia Panel in a.m. -Continue to monitor for signs and symptoms bleeding; no overt bleeding noted -Repeat CBC in a.m.  Hypoalbuminemia -Patient's Albumin Trend: Recent Labs  Lab 12/28/22 2155 12/29/22 0107 12/30/22 0707 12/31/22 0618 01/01/23 0500 01/02/23 0341 01/04/23 0445  ALBUMIN 2.5* 2.5* 2.2* 2.4* 2.3* 2.3* 2.1*  -Continue to Monitor and Trend and repeat CMP in the AM  Super Morbid Obesity -Complicates overall prognosis and  care -Estimated body mass index is 81.14 kg/m as calculated from the following:   Height as of this encounter: 5\' 7"  (1.702 m).   Weight as of this encounter: 235 kg.  -Weight Loss and Dietary Counseling given   DVT prophylaxis: heparin injection 5,000 Units Start: 12/29/22 1430 SCD's Start: 12/21/22 1116    Code Status: Full Code Family Communication: No family currently at bedside  Disposition Plan:  Level of care: Progressive Status is: Inpatient Remains inpatient appropriate because: N   Consultants:  General Surgery is primary TRH PCCM  Procedures:  As delineated as above  Antimicrobials:  Anti-infectives (From admission, onward)    Start     Dose/Rate Route Frequency Ordered Stop   01/01/23 2000  ceFEPIme (MAXIPIME) 2 g in sodium chloride 0.9 % 100 mL IVPB        2 g 200 mL/hr over 30 Minutes Intravenous Every 8 hours 01/01/23 1311     12/31/22 1245  linezolid (ZYVOX) IVPB 600 mg  Status:  Discontinued        600 mg 300 mL/hr over 60 Minutes Intravenous 2 times daily 12/31/22 1145 01/02/23 1146   12/29/22 2200  ceFEPIme (MAXIPIME) 2 g in sodium chloride 0.9 % 100 mL IVPB  Status:  Discontinued        2 g 200 mL/hr over 30 Minutes Intravenous  Every 12 hours 12/28/22 2127 12/29/22 1401   12/29/22 1500  ceFEPIme (MAXIPIME) 2 g in sodium chloride 0.9 % 100 mL IVPB  Status:  Discontinued        2 g 200 mL/hr over 30 Minutes Intravenous 2 times daily 12/29/22 1401 01/01/23 1311   12/29/22 1215  linezolid (ZYVOX) IVPB 600 mg  Status:  Discontinued        600 mg 300 mL/hr over 60 Minutes Intravenous Every 12 hours 12/29/22 1120 12/31/22 1145   12/29/22 0400  vancomycin (VANCOREADY) IVPB 1250 mg/250 mL  Status:  Discontinued        1,250 mg 166.7 mL/hr over 90 Minutes Intravenous Every 12 hours 12/28/22 1512 12/28/22 2127   12/28/22 2126  vancomycin variable dose per unstable renal function (pharmacist dosing)  Status:  Discontinued         Does not apply See admin  instructions 12/28/22 2127 12/29/22 1120   12/28/22 2100  ceFEPIme (MAXIPIME) 2 g in sodium chloride 0.9 % 100 mL IVPB  Status:  Discontinued        2 g 200 mL/hr over 30 Minutes Intravenous Every 8 hours 12/28/22 1512 12/28/22 2127   12/28/22 2100  metroNIDAZOLE (FLAGYL) IVPB 500 mg        500 mg 100 mL/hr over 60 Minutes Intravenous Every 12 hours 12/28/22 1512     12/28/22 1600  vancomycin (VANCOREADY) IVPB 2000 mg/400 mL        2,000 mg 200 mL/hr over 120 Minutes Intravenous  Once 12/28/22 1512 12/28/22 1752   12/28/22 1230  piperacillin-tazobactam (ZOSYN) IVPB 3.375 g  Status:  Discontinued        3.375 g 12.5 mL/hr over 240 Minutes Intravenous Every 8 hours 12/28/22 1116 12/28/22 1503   12/21/22 0707  ceFAZolin (ANCEF) 3-0.9 GM/100ML-% IVPB       Note to Pharmacy: Blair Promise B: cabinet override      12/21/22 0707 12/21/22 1914   12/21/22 0600  ceFAZolin (ANCEF) IVPB 2g/100 mL premix  Status:  Discontinued        2 g 200 mL/hr over 30 Minutes Intravenous On call to O.R. 12/21/22 0541 12/21/22 1058       Subjective: Seen and examined at bedside and he is playing on his phone.  He thinks he is doing okay.  Denied any nausea or vomiting.  Had a temperature yesterday but had not had a temperature today.  No other concerns or complaints at this time.  Given his elevated WBC and temperature infectious workup is being initiated.  Objective: Vitals:   01/04/23 0116 01/04/23 0119 01/04/23 0501 01/04/23 1142  BP: 131/79  115/69 137/60  Pulse: (!) 101  100 88  Resp:   18 20  Temp: 99.3 F (37.4 C) 98.8 F (37.1 C) 98.4 F (36.9 C) 98.7 F (37.1 C)  TempSrc: Oral Axillary Oral Oral  SpO2: 96%  92% 95%  Weight:      Height:        Intake/Output Summary (Last 24 hours) at 01/04/2023 1640 Last data filed at 01/04/2023 1137 Gross per 24 hour  Intake 2072.93 ml  Output 3545 ml  Net -1472.07 ml   Filed Weights   12/21/22 0543  Weight: (!) 235 kg   Examination: Physical  Exam:  Constitutional: WN/WD morbidly obese Caucasian male who is calm and playing on his phone Respiratory: Diminished to auscultation bilaterally with some coarse breath sounds at the bases, no wheezing, rales, rhonchi or crackles. Normal  respiratory effort and patient is not tachypenic. No accessory muscle use.  Unlabored breathing Cardiovascular: RRR, no murmurs / rubs / gallops. S1 and S2 auscultated.  Has some 1+ extremity edema Abdomen: Soft, distended secondary body habitus and has a biliary drain in place as well as abdominal scars from his surgery. Bowel sounds positive.  GU: Deferred. Musculoskeletal: No clubbing / cyanosis of digits/nails. No joint deformity upper and lower extremities.  Skin: No rashes, lesions, ulcers on limited skin evaluation. No induration; Warm and dry.  Neurologic: CN 2-12 grossly intact with no focal deficits. Romberg sign and cerebellar reflexes not assessed.  Psychiatric: Normal judgment and insight. Alert and oriented x 3. Normal mood and appropriate affect.   Data Reviewed: I have personally reviewed following labs and imaging studies  CBC: Recent Labs  Lab 12/28/22 2112 12/29/22 0014 12/31/22 0618 01/01/23 0500 01/02/23 0341 01/03/23 0325 01/04/23 0445  WBC 31.8*   < > 30.9* 25.0* 20.4* 15.9* 17.7*  NEUTROABS 28.5*  --   --   --   --   --  11.0*  HGB 11.7*   < > 10.5* 10.3* 10.3* 11.1* 11.4*  HCT 40.6   < > 33.9* 33.9* 33.4* 35.9* 36.4*  MCV 96.7   < > 92.4 91.9 90.5 90.0 90.8  PLT 316   < > 190 169 177 177 205   < > = values in this interval not displayed.   Basic Metabolic Panel: Recent Labs  Lab 12/29/22 0107 12/29/22 0129 12/30/22 0707 12/31/22 0618 01/01/23 0500 01/02/23 0341 01/03/23 0324 01/03/23 0325 01/04/23 0445  NA 133*   < > 134* 132* 134* 140  --  138 136  K 4.7   < > 4.1 4.6 3.8 3.5  --  3.0* 3.2*  CL 97*  --  99 98 98 103  --  101 100  CO2 21*  --  19* 21* 22 25  --  28 27  GLUCOSE 138*  --  107* 127* 101* 111*   --  145* 117*  BUN 28*  --  44* 58* 64* 58*  --  43* 22*  CREATININE 3.08*  --  3.59* 3.45* 2.39* 1.34*  --  0.72 0.45*  CALCIUM 7.7*  --  7.4* 8.0* 8.2* 8.2*  --  7.9* 7.6*  MG 1.5*  --  1.8  --   --   --  2.1  --  1.5*  PHOS 4.7*  --  4.8*  --   --   --  1.8*  --  1.9*   < > = values in this interval not displayed.   GFR: Estimated Creatinine Clearance: 239.1 mL/min (A) (by C-G formula based on SCr of 0.45 mg/dL (L)). Liver Function Tests: Recent Labs  Lab 12/30/22 0707 12/31/22 0618 01/01/23 0500 01/02/23 0341 01/04/23 0445  AST 22 20 17  14* 12*  ALT 32 34 34 28 16  ALKPHOS 79 105 110 106 68  BILITOT 0.9 1.0 0.7 0.8 0.8  PROT 5.6* 6.2* 6.2* 6.0* 5.6*  ALBUMIN 2.2* 2.4* 2.3* 2.3* 2.1*   Recent Labs  Lab 12/29/22 0107 12/30/22 0938  LIPASE 15 14   No results for input(s): "AMMONIA" in the last 168 hours. Coagulation Profile: Recent Labs  Lab 12/30/22 0707  INR 1.1   Cardiac Enzymes: No results for input(s): "CKTOTAL", "CKMB", "CKMBINDEX", "TROPONINI" in the last 168 hours. BNP (last 3 results) No results for input(s): "PROBNP" in the last 8760 hours. HbA1C: No results for input(s): "HGBA1C" in  the last 72 hours. CBG: Recent Labs  Lab 01/03/23 2241 01/04/23 0058 01/04/23 0458 01/04/23 0724 01/04/23 1133  GLUCAP 126* 97 115* 123* 136*   Lipid Profile: No results for input(s): "CHOL", "HDL", "LDLCALC", "TRIG", "CHOLHDL", "LDLDIRECT" in the last 72 hours. Thyroid Function Tests: No results for input(s): "TSH", "T4TOTAL", "FREET4", "T3FREE", "THYROIDAB" in the last 72 hours. Anemia Panel: No results for input(s): "VITAMINB12", "FOLATE", "FERRITIN", "TIBC", "IRON", "RETICCTPCT" in the last 72 hours. Sepsis Labs: Recent Labs  Lab 12/28/22 2039 12/29/22 0107 12/29/22 0450 01/04/23 0445  PROCALCITON  --  >150.00  --  3.33  LATICACIDVEN 2.7* 2.4* 2.0* 0.8   Recent Results (from the past 240 hour(s))  MRSA Next Gen by PCR, Nasal     Status: Abnormal    Collection Time: 12/28/22 11:23 AM   Specimen: Nasal Mucosa; Nasal Swab  Result Value Ref Range Status   MRSA by PCR Next Gen DETECTED (A) NOT DETECTED Final    Comment: RESULT CALLED TO, READ BACK BY AND VERIFIED WITH: B.FOLEY, RN AT 1543 ON 09.12.24 BY N.THOMPSON (NOTE) The GeneXpert MRSA Assay (FDA approved for NASAL specimens only), is one component of a comprehensive MRSA colonization surveillance program. It is not intended to diagnose MRSA infection nor to guide or monitor treatment for MRSA infections. Test performance is not FDA approved in patients less than 27 years old. Performed at [facility], [address], [place], [place] [address]   Culture, blood (x 2)     Status: None   Collection Time: 12/28/22 11:47 AM   Specimen: BLOOD RIGHT ARM  Result Value Ref Range Status   Specimen Description   Final    BLOOD RIGHT ARM Performed at [facility], [address], [place], [place] [address]    Special Requests   Final    BOTTLES DRAWN AEROBIC AND ANAEROBIC Blood Culture adequate volume Performed at [facility], [address], [place], [place] [address]    Culture   Final    NO GROWTH 5 DAYS Performed at [facility], [address], [place], [place] [address]    Report Status 01/02/2023 FINAL  Final  Culture, blood (x 2)     Status: Abnormal   Collection Time: 12/28/22 11:47 AM   Specimen: BLOOD RIGHT HAND  Result Value Ref Range Status   Specimen Description   Final    BLOOD RIGHT HAND Performed at [facility], [address], [place], [place] [address]    Special Requests   Final    BOTTLES DRAWN AEROBIC ONLY Blood Culture adequate volume Performed at [facility], [address], [place], [place] [address]    Culture  Setup Time   Final    GRAM POSITIVE COCCI IN CLUSTERS AEROBIC BOTTLE ONLY CRITICAL RESULT CALLED TO, READ BACK BY AND VERIFIED WITH: PHARMD N.GOOGOVAC AT 1401 ON  12/29/2022 BY T.SAAD.    Culture (A)  Final    STAPHYLOCOCCUS EPIDERMIDIS THE SIGNIFICANCE OF ISOLATING THIS ORGANISM FROM A SINGLE SET OF BLOOD CULTURES WHEN MULTIPLE SETS ARE DRAWN IS UNCERTAIN. PLEASE NOTIFY THE MICROBIOLOGY DEPARTMENT WITHIN ONE WEEK IF SPECIATION AND SENSITIVITIES ARE REQUIRED. Performed at [facility], [address], [place], [place] [address]    Report Status 12/30/2022 FINAL  Final  Blood Culture ID Panel (Reflexed)     Status: Abnormal   Collection Time: 12/28/22 11:47 AM  Result Value Ref Range Status   Enterococcus faecalis  NOT DETECTED NOT DETECTED Final   Enterococcus Faecium NOT DETECTED NOT DETECTED Final   Listeria monocytogenes NOT DETECTED NOT DETECTED Final   Staphylococcus species DETECTED (A) NOT DETECTED Final    Comment: CRITICAL RESULT CALLED TO, READ BACK BY AND VERIFIED WITH: PHARMD N.GOOGOVAC AT 1401 ON 12/29/2022 BY T.SAAD.    Staphylococcus aureus (BCID) NOT DETECTED NOT DETECTED Final   Staphylococcus epidermidis DETECTED (A) NOT DETECTED Final    Comment: CRITICAL RESULT CALLED TO, READ BACK BY AND VERIFIED WITH: PHARMD N.GOOGOVAC AT 1401 ON 12/29/2022 BY T.SAAD.    Staphylococcus lugdunensis NOT DETECTED NOT DETECTED Final   Streptococcus species NOT DETECTED NOT DETECTED Final   Streptococcus agalactiae NOT DETECTED NOT DETECTED Final   Streptococcus pneumoniae NOT DETECTED NOT DETECTED Final   Streptococcus pyogenes NOT DETECTED NOT DETECTED Final   A.calcoaceticus-baumannii NOT DETECTED NOT DETECTED Final   Bacteroides fragilis NOT DETECTED NOT DETECTED Final   Enterobacterales NOT DETECTED NOT DETECTED Final   Enterobacter cloacae complex NOT DETECTED NOT DETECTED Final   Escherichia coli NOT DETECTED NOT DETECTED Final   Klebsiella aerogenes NOT DETECTED NOT DETECTED Final   Klebsiella oxytoca NOT DETECTED NOT DETECTED Final   Klebsiella pneumoniae NOT DETECTED NOT DETECTED Final   Proteus species NOT DETECTED NOT  DETECTED Final   Salmonella species NOT DETECTED NOT DETECTED Final   Serratia marcescens NOT DETECTED NOT DETECTED Final   Haemophilus influenzae NOT DETECTED NOT DETECTED Final   Neisseria meningitidis NOT DETECTED NOT DETECTED Final   Pseudomonas aeruginosa NOT DETECTED NOT DETECTED Final   Stenotrophomonas maltophilia NOT DETECTED NOT DETECTED Final   Candida albicans NOT DETECTED NOT DETECTED Final   Candida auris NOT DETECTED NOT DETECTED Final   Candida glabrata NOT DETECTED NOT DETECTED Final   Candida krusei NOT DETECTED NOT DETECTED Final   Candida parapsilosis NOT DETECTED NOT DETECTED Final   Candida tropicalis NOT DETECTED NOT DETECTED Final   Cryptococcus neoformans/gattii NOT DETECTED NOT DETECTED Final   Methicillin resistance mecA/C NOT DETECTED NOT DETECTED Final    Comment: Performed at [facility], [address], [place], [place] [address]  Culture, blood (Routine X 2) w Reflex to ID Panel     Status: None (Preliminary result)   Collection Time: 01/04/23 11:29 AM   Specimen: BLOOD RIGHT HAND  Result Value Ref Range Status   Specimen Description   Final    BLOOD RIGHT HAND Performed at [facility], [address], [place], [place] [address]    Special Requests   Final    BOTTLES DRAWN AEROBIC AND ANAEROBIC Blood Culture adequate volume Performed at [facility], [address] [place], [place] [address]    Culture PENDING  Incomplete   Report Status PENDING  Incomplete    Radiology Studies: DG CHEST PORT 1 VIEW  Result Date: 01/04/2023 CLINICAL DATA:  Leukocytosis EXAM: PORTABLE CHEST 1 VIEW COMPARISON:  12/29/2022 FINDINGS: Previously seen central venous catheter has been removed. Stable cardiomegaly with pulmonary vascular congestion. Increasing streaky bibasilar opacities. No pleural effusion or pneumothorax. IMPRESSION: 1. Increasing streaky bibasilar opacities, which may represent atelectasis or infection. 2. Cardiomegaly  with pulmonary vascular congestion. Electronically Signed   By: Duanne Guess D.O.   On: 01/04/2023 14:17   Scheduled Meds:  allopurinol  100 mg Oral Daily   bisacodyl  10 mg Rectal Daily   Chlorhexidine Gluconate Cloth  6 each Topical Daily   colchicine  0.6 mg Oral Daily  furosemide  40 mg Intravenous Once   gabapentin  300 mg Oral QID   Gerhardt's butt cream   Topical BID   guaiFENesin  1,200 mg Oral BID   heparin injection (subcutaneous)  5,000 Units Subcutaneous Q8H   hydrocortisone  1 Application Rectal BID   insulin aspart  0-20 Units Subcutaneous Q4H   lidocaine  1 patch Transdermal Daily   nystatin   Topical BID   pantoprazole (PROTONIX) IV  40 mg Intravenous Q12H   Ensure Max Protein  11 oz Oral Daily   Continuous Infusions:  sodium chloride Stopped (12/29/22 0059)   sodium chloride Stopped (01/03/23 1522)   ceFEPime (MAXIPIME) IV 2 g (01/04/23 1225)   methocarbamol (ROBAXIN) IV Stopped (01/02/23 0306)   metronidazole 500 mg (01/04/23 1020)   ondansetron (ZOFRAN) IV     potassium PHOSPHATE IVPB (in mmol) 30 mmol (01/04/23 1317)    LOS: 13 days   Marguerita Merles, DO [facility] Available via Epic secure chat 7am-7pm After these hours, please refer to coverage provider listed on [email or web address] 01/04/2023, 4:40 PM

## 2023-01-04 NOTE — Plan of Care (Signed)

## 2023-01-04 NOTE — Progress Notes (Signed)
   01/04/23 2057  BiPAP/CPAP/SIPAP  Reason BIPAP/CPAP not in use Non-compliant (pt refused)   Pt does not want to wear it while here in the hospital.

## 2023-01-04 NOTE — Progress Notes (Signed)
14 Days Post-Op  Subjective: Pain improved, tolerated liquids  ROS: See above, otherwise other systems negative  Objective: Vital signs in last 24 hours: Temp:  [98.4 F (36.9 C)-100.6 F (38.1 C)] 98.4 F (36.9 C) (09/19 0501) Pulse Rate:  [79-102] 100 (09/19 0501) Resp:  [18-26] 18 (09/19 0501) BP: (115-182)/(67-90) 115/69 (09/19 0501) SpO2:  [92 %-98 %] 92 % (09/19 0501) Last BM Date : 01/03/23  Intake/Output from previous day: 09/18 0701 - 09/19 0700 In: 3127.3 [P.O.:1800; I.V.:57.2; IV Piggyback:1270.1] Out: 4595 [Urine:3975; Drains:120; Stool:500] Intake/Output this shift: No intake/output data recorded.  PE: Gen: NAD Resp: nonlabored CV: RRR Abd: incisions c/d/i  Lab Results:  Recent Labs    01/03/23 0325 01/04/23 0445  WBC 15.9* 17.7*  HGB 11.1* 11.4*  HCT 35.9* 36.4*  PLT 177 205   BMET Recent Labs    01/03/23 0325 01/04/23 0445  NA 138 136  K 3.0* 3.2*  CL 101 100  CO2 28 27  GLUCOSE 145* 117*  BUN 43* 22*  CREATININE 0.72 0.45*  CALCIUM 7.9* 7.6*   PT/INR No results for input(s): "LABPROT", "INR" in the last 72 hours. CMP     Component Value Date/Time   NA 136 01/04/2023 0445   NA 139 11/14/2012 0000   K 3.2 (L) 01/04/2023 0445   K 4.1 11/14/2012 0000   CL 100 01/04/2023 0445   CO2 27 01/04/2023 0445   GLUCOSE 117 (H) 01/04/2023 0445   BUN 22 (H) 01/04/2023 0445   BUN 11 11/14/2012 0000   CREATININE 0.45 (L) 01/04/2023 0445   CREATININE 0.62 11/14/2012 0000   CALCIUM 7.6 (L) 01/04/2023 0445   PROT 5.6 (L) 01/04/2023 0445   ALBUMIN 2.1 (L) 01/04/2023 0445   ALBUMIN 2.6 11/14/2012 0000   AST 12 (L) 01/04/2023 0445   ALT 16 01/04/2023 0445   ALKPHOS 68 01/04/2023 0445   BILITOT 0.8 01/04/2023 0445   GFRNONAA >60 01/04/2023 0445   GFRAA >60 12/30/2017 0619   Lipase     Component Value Date/Time   LIPASE 14 12/30/2022 0938    Studies/Results: No results found.  Anti-infectives: Anti-infectives (From admission,  onward)    Start     Dose/Rate Route Frequency Ordered Stop   01/01/23 2000  ceFEPIme (MAXIPIME) 2 g in sodium chloride 0.9 % 100 mL IVPB        2 g 200 mL/hr over 30 Minutes Intravenous Every 8 hours 01/01/23 1311     12/31/22 1245  linezolid (ZYVOX) IVPB 600 mg  Status:  Discontinued        600 mg 300 mL/hr over 60 Minutes Intravenous 2 times daily 12/31/22 1145 01/02/23 1146   12/29/22 2200  ceFEPIme (MAXIPIME) 2 g in sodium chloride 0.9 % 100 mL IVPB  Status:  Discontinued        2 g 200 mL/hr over 30 Minutes Intravenous Every 12 hours 12/28/22 2127 12/29/22 1401   12/29/22 1500  ceFEPIme (MAXIPIME) 2 g in sodium chloride 0.9 % 100 mL IVPB  Status:  Discontinued        2 g 200 mL/hr over 30 Minutes Intravenous 2 times daily 12/29/22 1401 01/01/23 1311   12/29/22 1215  linezolid (ZYVOX) IVPB 600 mg  Status:  Discontinued        600 mg 300 mL/hr over 60 Minutes Intravenous Every 12 hours 12/29/22 1120 12/31/22 1145   12/29/22 0400  vancomycin (VANCOREADY) IVPB 1250 mg/250 mL  Status:  Discontinued  1,250 mg 166.7 mL/hr over 90 Minutes Intravenous Every 12 hours 12/28/22 1512 12/28/22 2127   12/28/22 2126  vancomycin variable dose per unstable renal function (pharmacist dosing)  Status:  Discontinued         Does not apply See admin instructions 12/28/22 2127 12/29/22 1120   12/28/22 2100  ceFEPIme (MAXIPIME) 2 g in sodium chloride 0.9 % 100 mL IVPB  Status:  Discontinued        2 g 200 mL/hr over 30 Minutes Intravenous Every 8 hours 12/28/22 1512 12/28/22 2127   12/28/22 2100  metroNIDAZOLE (FLAGYL) IVPB 500 mg        500 mg 100 mL/hr over 60 Minutes Intravenous Every 12 hours 12/28/22 1512     12/28/22 1600  vancomycin (VANCOREADY) IVPB 2000 mg/400 mL        2,000 mg 200 mL/hr over 120 Minutes Intravenous  Once 12/28/22 1512 12/28/22 1752   12/28/22 1230  piperacillin-tazobactam (ZOSYN) IVPB 3.375 g  Status:  Discontinued        3.375 g 12.5 mL/hr over 240 Minutes  Intravenous Every 8 hours 12/28/22 1116 12/28/22 1503   12/21/22 0707  ceFAZolin (ANCEF) 3-0.9 GM/100ML-% IVPB       Note to Pharmacy: Blair Promise B: cabinet override      12/21/22 0707 12/21/22 1914   12/21/22 0600  ceFAZolin (ANCEF) IVPB 2g/100 mL premix  Status:  Discontinued        2 g 200 mL/hr over 30 Minutes Intravenous On call to O.R. 12/21/22 0541 12/21/22 1058       Assessment/Plan  37 yo male s/p partial cholecystectomy, drain decreasing, tolerated liquids, WBC slightly uptrending   FEN - carb modified VTE - heparin [place] ID - cefepime flagyl Dispo - repeat XR and urine culture for WBC increase, inpatient  I reviewed last 24 h vitals and pain scores, last 48 h intake and output, last 24 h labs and trends, and last 24 h imaging results.  This care required moderate level of medical decision making.    LOS: 13 days   De Blanch [facility] 01/04/2023, 8:59 AM Please see Amion for pager number during day hours 7:00am-4:30pm or 7:00am -11:30am on weekends

## 2023-01-04 NOTE — Progress Notes (Signed)
Patient refused to go down for CT today. Patient had only drank less than one half of one bottle of contrast. Patient still has a full bottle and at least half of a second bottle of contrast at bedside. Patient told MD that he will do it in the morning. Will pass along to night shift nurse to follow-up.

## 2023-01-05 DIAGNOSIS — Z7401 Bed confinement status: Secondary | ICD-10-CM | POA: Diagnosis not present

## 2023-01-05 DIAGNOSIS — K254 Chronic or unspecified gastric ulcer with hemorrhage: Secondary | ICD-10-CM

## 2023-01-05 DIAGNOSIS — K8010 Calculus of gallbladder with chronic cholecystitis without obstruction: Secondary | ICD-10-CM | POA: Diagnosis not present

## 2023-01-05 DIAGNOSIS — N179 Acute kidney failure, unspecified: Secondary | ICD-10-CM | POA: Diagnosis not present

## 2023-01-05 DIAGNOSIS — J9601 Acute respiratory failure with hypoxia: Secondary | ICD-10-CM | POA: Diagnosis not present

## 2023-01-05 LAB — CBC WITH DIFFERENTIAL/PLATELET
Abs Immature Granulocytes: 1.82 10*3/uL — ABNORMAL HIGH (ref 0.00–0.07)
Basophils Absolute: 0.1 10*3/uL (ref 0.0–0.1)
Basophils Relative: 1 %
Eosinophils Absolute: 0.3 10*3/uL (ref 0.0–0.5)
Eosinophils Relative: 1 %
HCT: 35.8 % — ABNORMAL LOW (ref 39.0–52.0)
Hemoglobin: 11.4 g/dL — ABNORMAL LOW (ref 13.0–17.0)
Immature Granulocytes: 10 %
Lymphocytes Relative: 13 %
Lymphs Abs: 2.2 10*3/uL (ref 0.7–4.0)
MCH: 28.6 pg (ref 26.0–34.0)
MCHC: 31.8 g/dL (ref 30.0–36.0)
MCV: 89.7 fL (ref 80.0–100.0)
Monocytes Absolute: 1.7 10*3/uL — ABNORMAL HIGH (ref 0.1–1.0)
Monocytes Relative: 10 %
Neutro Abs: 11.5 10*3/uL — ABNORMAL HIGH (ref 1.7–7.7)
Neutrophils Relative %: 65 %
Platelets: 220 10*3/uL (ref 150–400)
RBC: 3.99 MIL/uL — ABNORMAL LOW (ref 4.22–5.81)
RDW: 14 % (ref 11.5–15.5)
WBC: 17.5 10*3/uL — ABNORMAL HIGH (ref 4.0–10.5)
nRBC: 0 % (ref 0.0–0.2)

## 2023-01-05 LAB — COMPREHENSIVE METABOLIC PANEL
ALT: 13 U/L (ref 0–44)
AST: 18 U/L (ref 15–41)
Albumin: 2.1 g/dL — ABNORMAL LOW (ref 3.5–5.0)
Alkaline Phosphatase: 73 U/L (ref 38–126)
Anion gap: 8 (ref 5–15)
BUN: 16 mg/dL (ref 6–20)
CO2: 29 mmol/L (ref 22–32)
Calcium: 7.4 mg/dL — ABNORMAL LOW (ref 8.9–10.3)
Chloride: 97 mmol/L — ABNORMAL LOW (ref 98–111)
Creatinine, Ser: 0.55 mg/dL — ABNORMAL LOW (ref 0.61–1.24)
GFR, Estimated: >60 mL/min (ref >60)
Glucose, Bld: 113 mg/dL — ABNORMAL HIGH (ref 70–99)
Potassium: 3.7 mmol/L (ref 3.5–5.1)
Sodium: 134 mmol/L — ABNORMAL LOW (ref 135–145)
Total Bilirubin: 1 mg/dL (ref 0.3–1.2)
Total Protein: 5.5 g/dL — ABNORMAL LOW (ref 6.5–8.1)

## 2023-01-05 LAB — URINALYSIS, ROUTINE W REFLEX MICROSCOPIC
Bilirubin Urine: NEGATIVE
Glucose, UA: NEGATIVE mg/dL
Ketones, ur: NEGATIVE mg/dL
Nitrite: NEGATIVE
Protein, ur: 100 mg/dL — AB
Specific Gravity, Urine: 1.014 (ref 1.005–1.030)
pH: 6 (ref 5.0–8.0)

## 2023-01-05 LAB — GLUCOSE, CAPILLARY
Glucose-Capillary: 117 mg/dL — ABNORMAL HIGH (ref 70–99)
Glucose-Capillary: 90 mg/dL (ref 70–99)
Glucose-Capillary: 101 mg/dL — ABNORMAL HIGH (ref 70–99)
Glucose-Capillary: 93 mg/dL (ref 70–99)
Glucose-Capillary: 141 mg/dL — ABNORMAL HIGH (ref 70–99)
Glucose-Capillary: 124 mg/dL — ABNORMAL HIGH (ref 70–99)

## 2023-01-05 LAB — MAGNESIUM: Magnesium: 1.7 mg/dL (ref 1.7–2.4)

## 2023-01-05 LAB — PHOSPHORUS: Phosphorus: 2.6 mg/dL (ref 2.5–4.6)

## 2023-01-05 MED ORDER — MAGNESIUM SULFATE 2 GM/50ML IV SOLN
2.0000 g | Freq: Once | INTRAVENOUS | Status: AC
  Administered 2023-01-05: 2 g via INTRAVENOUS
  Filled 2023-01-05: qty 50

## 2023-01-05 MED ORDER — IOHEXOL 9 MG/ML PO SOLN
ORAL | Status: AC
  Filled 2023-01-05: qty 1000

## 2023-01-05 MED ORDER — IOHEXOL 9 MG/ML PO SOLN: 500.0000 mL | ORAL | Status: DC

## 2023-01-05 NOTE — Progress Notes (Signed)
15 Days Post-Op  Subjective: Pain issues overnight, this morning it is better  ROS: See above, otherwise other systems negative  Objective: Vital signs in last 24 hours: Temp:  [98.7 F (37.1 C)-99.3 F (37.4 C)] 99.3 F (37.4 C) (09/20 0413) Pulse Rate:  [67-100] 67 (09/20 0413) Resp:  [19-20] 19 (09/20 0413) BP: (122-137)/(60-70) 134/68 (09/20 0413) SpO2:  [91 %-95 %] 91 % (09/20 0413) Last BM Date : 01/03/23  Intake/Output from previous day: 09/19 0701 - 09/20 0700 In: 2530.1 [P.O.:920; IV Piggyback:1610.1] Out: 3590 [Urine:3475; Drains:115] Intake/Output this shift: No intake/output data recorded.  PE: Gen: NAd Resp: nonlabored CV: RRR Abd: soft, drainage golden bilious  Lab Results:  Recent Labs    01/04/23 0445 01/05/23 0419  WBC 17.7* 17.5*  HGB 11.4* 11.4*  HCT 36.4* 35.8*  PLT 205 220   BMET Recent Labs    01/04/23 0445 01/05/23 0419  NA 136 134*  K 3.2* 3.7  CL 100 97*  CO2 27 29  GLUCOSE 117* 113*  BUN 22* 16  CREATININE 0.45* 0.55*  CALCIUM 7.6* 7.4*   PT/INR No results for input(s): "LABPROT", "INR" in the last 72 hours. CMP     Component Value Date/Time   NA 134 (L) 01/05/2023 0419   NA 139 11/14/2012 0000   K 3.7 01/05/2023 0419   K 4.1 11/14/2012 0000   CL 97 (L) 01/05/2023 0419   CO2 29 01/05/2023 0419   GLUCOSE 113 (H) 01/05/2023 0419   BUN 16 01/05/2023 0419   BUN 11 11/14/2012 0000   CREATININE 0.55 (L) 01/05/2023 0419   CREATININE 0.62 11/14/2012 0000   CALCIUM 7.4 (L) 01/05/2023 0419   PROT 5.5 (L) 01/05/2023 0419   ALBUMIN 2.1 (L) 01/05/2023 0419   ALBUMIN 2.6 11/14/2012 0000   AST 18 01/05/2023 0419   ALT 13 01/05/2023 0419   ALKPHOS 73 01/05/2023 0419   BILITOT 1.0 01/05/2023 0419   GFRNONAA >60 01/05/2023 0419   GFRAA >60 12/30/2017 0619   Lipase     Component Value Date/Time   LIPASE 14 12/30/2022 0938    Studies/Results: DG CHEST PORT 1 VIEW  Result Date: 01/04/2023 CLINICAL DATA:   Leukocytosis EXAM: PORTABLE CHEST 1 VIEW COMPARISON:  12/29/2022 FINDINGS: Previously seen central venous catheter has been removed. Stable cardiomegaly with pulmonary vascular congestion. Increasing streaky bibasilar opacities. No pleural effusion or pneumothorax. IMPRESSION: 1. Increasing streaky bibasilar opacities, which may represent atelectasis or infection. 2. Cardiomegaly with pulmonary vascular congestion. Electronically Signed   By: Duanne Guess D.O.   On: 01/04/2023 14:17    Anti-infectives: Anti-infectives (From admission, onward)    Start     Dose/Rate Route Frequency Ordered Stop   01/01/23 2000  ceFEPIme (MAXIPIME) 2 g in sodium chloride 0.9 % 100 mL IVPB        2 g 200 mL/hr over 30 Minutes Intravenous Every 8 hours 01/01/23 1311     12/31/22 1245  linezolid (ZYVOX) IVPB 600 mg  Status:  Discontinued        600 mg 300 mL/hr over 60 Minutes Intravenous 2 times daily 12/31/22 1145 01/02/23 1146   12/29/22 2200  ceFEPIme (MAXIPIME) 2 g in sodium chloride 0.9 % 100 mL IVPB  Status:  Discontinued        2 g 200 mL/hr over 30 Minutes Intravenous Every 12 hours 12/28/22 2127 12/29/22 1401   12/29/22 1500  ceFEPIme (MAXIPIME) 2 g in sodium chloride 0.9 % 100 mL IVPB  Status:  Discontinued        2 g 200 mL/hr over 30 Minutes Intravenous 2 times daily 12/29/22 1401 01/01/23 1311   12/29/22 1215  linezolid (ZYVOX) IVPB 600 mg  Status:  Discontinued        600 mg 300 mL/hr over 60 Minutes Intravenous Every 12 hours 12/29/22 1120 12/31/22 1145   12/29/22 0400  vancomycin (VANCOREADY) IVPB 1250 mg/250 mL  Status:  Discontinued        1,250 mg 166.7 mL/hr over 90 Minutes Intravenous Every 12 hours 12/28/22 1512 12/28/22 2127   12/28/22 2126  vancomycin variable dose per unstable renal function (pharmacist dosing)  Status:  Discontinued         Does not apply See admin instructions 12/28/22 2127 12/29/22 1120   12/28/22 2100  ceFEPIme (MAXIPIME) 2 g in sodium chloride 0.9 % 100 mL IVPB   Status:  Discontinued        2 g 200 mL/hr over 30 Minutes Intravenous Every 8 hours 12/28/22 1512 12/28/22 2127   12/28/22 2100  metroNIDAZOLE (FLAGYL) IVPB 500 mg        500 mg 100 mL/hr over 60 Minutes Intravenous Every 12 hours 12/28/22 1512     12/28/22 1600  vancomycin (VANCOREADY) IVPB 2000 mg/400 mL        2,000 mg 200 mL/hr over 120 Minutes Intravenous  Once 12/28/22 1512 12/28/22 1752   12/28/22 1230  piperacillin-tazobactam (ZOSYN) IVPB 3.375 g  Status:  Discontinued        3.375 g 12.5 mL/hr over 240 Minutes Intravenous Every 8 hours 12/28/22 1116 12/28/22 1503   12/21/22 0707  ceFAZolin (ANCEF) 3-0.9 GM/100ML-% IVPB       Note to Pharmacy: Blair Promise B: cabinet override      12/21/22 0707 12/21/22 1914   12/21/22 0600  ceFAZolin (ANCEF) IVPB 2g/100 mL premix  Status:  Discontinued        2 g 200 mL/hr over 30 Minutes Intravenous On call to O.R. 12/21/22 0541 12/21/22 1058       Assessment/Plan S/p partial cholecystectomy, bile leak, morbid obesity   UA concerning for leukocytes  FEN - carb mod diet VTE - heparin sq ID - cefepime flagyl  Dispo - CT scan today for persistent leukocytosis  I reviewed last 24 h vitals and pain scores, last 48 h intake and output, last 24 h labs and trends, and last 24 h imaging results.  This care required moderate level of medical decision making.    LOS: 14 days   De Blanch [facility] 01/05/2023, 8:01 AM Please see Amion for pager number during day hours 7:00am-4:30pm or 7:00am -11:30am on weekends

## 2023-01-05 NOTE — Progress Notes (Signed)
PT Cancellation Note  Patient Details Name: [name] MRN: [id number] DOB: May 27, 1985   Cancelled Treatment:    Reason Eval/Treat Not Completed: Patient declined, no reason specified Pt declined to participate at this time due to not being able to eat today and waiting on CT scan.  Pt refused to sit EOB with OT this morning.  RN also reports pt refusing aspects of care.  Pt from long term care facility and states he was no longer receiving therapies.  Pt states he would like to participate again however refused at this time and also has been refusing things this admission.  Will attempt to check back once more however will sign off if pt refuses.     Janan Halter [place] 01/05/2023, 2:39 PM Paulino Door, DPT Physical Therapist Acute Rehabilitation Services Office: [phone]

## 2023-01-05 NOTE — Progress Notes (Signed)
PROGRESS NOTE    [name]  ZOX:[id number] DOB: 10-23-1985 DOA: 12/21/2022 PCP: Toma Deiters, MD   Brief Narrative:  The Patient [name] is a 37 y/o male with obesity, NASH who presented with acute cholecystitis requiring cholecystectomy, which was complicated by his hepatomegaly. He had a bile leak post-op requiring a drain  Significant Hospital Events:    9/5 - Underwent elective lap chole with CCS. Partial cholecystectomy completed in the setting of poor visualization. EBL . Drain left in place. 9/12 - Transferred to SDU for worsening hypoxia, tachypnea, tachycardia. Hypotensive. CT Chest/A/P with postoperative changes, surgical drain in gallbladder fossa, minimal L basilar subsegmental atelectasis, L adrenal adenoma. TRH consulted. Fluids/Zosyn given, broadened to vanc/cefepime/Flagyl. Refused BiPAP/stated he would refuse intubation even if it meant death. Made DNR. Later in shift more hypotensive, PCCM consulted for pressors. Code status changed back to full code. Vasopressors initiated. 9/13 - A-line, LIJ CVC placed. High pressor requirements with NE 38, Neo 70, vaso 0.03. 9/16: neo stopped 9/13. NE and vaso stopped 9/15. Duoneb for wheezing. A-line removed. Transfer to SDU.  9/17: CCM requested TRH to continue to follow for medical management.  Surgery remains primary service.  9/18 -Had some Transient Bradycardia and ? Pause; will replete electrolytes and continue to monitor.  Patient's renal function is improved  9/19 -Spiked a temperature yesterday and so we will obtain blood cultures x 2 and workup with fever given that WBC slightly worsened to 17.7.   9/20 - WBC still about the same. Repeat CT Scan pending. Patient states he did not sleep very well.   Assessment and Plan:  Chronic Calculous Cholecystitis, sepsis from biliary source, improving slowly Biliary leak, post operatively.  -Currently off vasopressors, sepsis physiology improving but still spiking  intermittent temp, BP now elevated -WBC Trend: Recent Labs  Lab 12/30/22 0707 12/31/22 0618 01/01/23 0500 01/02/23 0341 01/03/23 0325 01/04/23 0445 01/05/23 0419  WBC 25.7* 30.9* 25.0* 20.4* 15.9* 17.7* 17.5*  -On broad-spectrum antibiotics, management per general surgery -Continues to spike some intermittent temperatures and had a Tmax of 100.6 today will need to continue monitor carefully and ordered Blood Cx x2; Agree with Surgery obtaining U/A and CXR -Repeat CXR done and showed "Increasing streaky bibasilar opacities, which may represent atelectasis or infection. Cardiomegaly with pulmonary vascular congestion." -U/A done and showed a hazy appearance with small hemoglobin, large leukocytes, negative nitrites, rare bacteria, 11-20 RBC per high-power field and 21-50 WBCs -Repeat CT Scan of the Abdomen and Pelvis pending  -Procalcitonin level was 3.33 however on admission it was greater than 150; repeat in the a.m. -Seen by GI this admission, plan and timing for ERCP per GI -Blood cultures 1/2 staph epi, currently on Cefepime and Flagyl; blood cultures x 2 being reobtained and repeat blood culture showing no growth to date less than 24 hours -Biliary drain in place -Further care per primary General Surgery with pain control -Patient is on Carb Modified Diet now    Acute on chronic respiratory failure with hypoxia -Multifactorial secondary to aggressive fluid resuscitation during shock, OSA, OHS - BiPAP as needed, received IV Lasix 40 mg x 1 on 9/16 (per PCCM) for fluid overload -Still positive balance of 6.505 L -BP currently stable, given Lasix 40 mg IVx1 yesterday on 9/17 and will hold today -Reorder a dose of IV Lasix 40 mg x1 yesterday  SpO2: 97 % O2 Flow Rate (L/min): 2 L/min FiO2 (%): 35 % -Continuous Pulse Oximetry and maintain O2 saturation greater than  90% -Continue supplemental oxygen via nasal cannula and wean oxygen to room air -Repeat CXR done and showed "Increasing  streaky bibasilar opacities, which may represent atelectasis or infection. Cardiomegaly with pulmonary vascular congestion." -Initiate Flutter Valve, Incentive Spirometry, Guaifenesin 1200 po BID -Continue to monitor oxygen requirements as necessary   Electrolyte Abnormalities including Hypokalemia and Hypophosphatemia and Hypomagnesemia -K, Mag, and Phos Level Trend: Recent Labs  Lab 12/30/22 0707 12/31/22 0618 01/01/23 0500 01/02/23 0341 01/03/23 0324 01/03/23 0325 01/04/23 0445 01/05/23 0419  K 4.1 4.6 3.8 3.5  --  3.0* 3.2* 3.7  MG 1.8  --   --   --    < >  --  1.5* 1.7  PHOS 4.8*  --   --   --    < >  --  1.9* 2.6   < > = values in this interval not displayed.  -Replete with IV Mag Sulfate 2 grams -Conitnue To monitor replete as necessary repeat CMP in a.m. along with Phos p.o.  OHS/OSA -Largely related to habitus, morbid obesity -BiPAP as above  AKI, improved and resolved  -In the setting of hypotension, sepsis.  Likely ATN and combination of obstructive physiology, unable to pass urine prior to Foley placement.   - Status post Foley placement with a cystoscopy 9/14 by Urology -BUN/Cr Trend: Recent Labs  Lab 12/30/22 0707 12/31/22 0618 01/01/23 0500 01/02/23 0341 01/03/23 0325 01/04/23 0445 01/05/23 0419  BUN 44* 58* 64* 58* 43* 22* 16  CREATININE 3.59* 3.45* 2.39* 1.34* 0.72 0.45* 0.55*  -Avoid Nephrotoxic Medications, Contrast Dyes, Hypotension and Dehydration to Ensure Adequate Renal Perfusion and will need to Renally Adjust Meds -Follow Renal Fxn Closely with Diuresis  -Continue to Monitor and Trend Renal Function carefully and repeat CMP in the AM   Normocytic Anemia -Hgb/Hct Trend: Recent Labs  Lab 12/30/22 0707 12/31/22 0618 01/01/23 0500 01/02/23 0341 01/03/23 0325 01/04/23 0445 01/05/23 0419  HGB 10.3* 10.5* 10.3* 10.3* 11.1* 11.4* 11.4*  HCT 33.2* 33.9* 33.9* 33.4* 35.9* 36.4* 35.8*  MCV 91.7 92.4 91.9 90.5 90.0 90.8 89.7  -Check Anemia  Panel in a.m. -Continue to monitor for signs and symptoms bleeding; no overt bleeding noted -Repeat CBC in a.m.  Hypoalbuminemia -Patient's Albumin Trend: Recent Labs  Lab 12/29/22 0107 12/30/22 0707 12/31/22 0618 01/01/23 0500 01/02/23 0341 01/04/23 0445 01/05/23 0419  ALBUMIN 2.5* 2.2* 2.4* 2.3* 2.3* 2.1* 2.1*  -Continue to Monitor and Trend and repeat CMP in the AM  Super Morbid Obesity -Complicates overall prognosis and care -Estimated body mass index is 81.14 kg/m as calculated from the following:   Height as of this encounter: 5\' 7"  (1.702 m).   Weight as of this encounter: 235 kg.  -Weight Loss and Dietary Counseling given   DVT prophylaxis: heparin injection 5,000 Units Start: 12/29/22 1430 SCD's Start: 12/21/22 1116    Code Status: Full Code Family Communication: No family present at bedside  Disposition Plan:  Level of care: Progressive Status is: Inpatient Remains inpatient appropriate because: Needs further clinical improvement and clearance by the primary team   Consultants:  General Surgery is primary [facility] PCCM  Procedures:  As delineated as above  Antimicrobials:  Anti-infectives (From admission, onward)    Start     Dose/Rate Route Frequency Ordered Stop   01/01/23 2000  ceFEPIme (MAXIPIME) 2 g in sodium chloride 0.9 % 100 mL IVPB        2 g 200 mL/hr over 30 Minutes Intravenous Every 8 hours 01/01/23 1311  12/31/22 1245  linezolid (ZYVOX) IVPB 600 mg  Status:  Discontinued        600 mg 300 mL/hr over 60 Minutes Intravenous 2 times daily 12/31/22 1145 01/02/23 1146   12/29/22 2200  ceFEPIme (MAXIPIME) 2 g in sodium chloride 0.9 % 100 mL IVPB  Status:  Discontinued        2 g 200 mL/hr over 30 Minutes Intravenous Every 12 hours 12/28/22 2127 12/29/22 1401   12/29/22 1500  ceFEPIme (MAXIPIME) 2 g in sodium chloride 0.9 % 100 mL IVPB  Status:  Discontinued        2 g 200 mL/hr over 30 Minutes Intravenous 2 times daily 12/29/22 1401 01/01/23  1311   12/29/22 1215  linezolid (ZYVOX) IVPB 600 mg  Status:  Discontinued        600 mg 300 mL/hr over 60 Minutes Intravenous Every 12 hours 12/29/22 1120 12/31/22 1145   12/29/22 0400  vancomycin (VANCOREADY) IVPB 1250 mg/250 mL  Status:  Discontinued        1,250 mg 166.7 mL/hr over 90 Minutes Intravenous Every 12 hours 12/28/22 1512 12/28/22 2127   12/28/22 2126  vancomycin variable dose per unstable renal function (pharmacist dosing)  Status:  Discontinued         Does not apply See admin instructions 12/28/22 2127 12/29/22 1120   12/28/22 2100  ceFEPIme (MAXIPIME) 2 g in sodium chloride 0.9 % 100 mL IVPB  Status:  Discontinued        2 g 200 mL/hr over 30 Minutes Intravenous Every 8 hours 12/28/22 1512 12/28/22 2127   12/28/22 2100  metroNIDAZOLE (FLAGYL) IVPB 500 mg        500 mg 100 mL/hr over 60 Minutes Intravenous Every 12 hours 12/28/22 1512     12/28/22 1600  vancomycin (VANCOREADY) IVPB 2000 mg/400 mL        2,000 mg 200 mL/hr over 120 Minutes Intravenous  Once 12/28/22 1512 12/28/22 1752   12/28/22 1230  piperacillin-tazobactam (ZOSYN) IVPB 3.375 g  Status:  Discontinued        3.375 g 12.5 mL/hr over 240 Minutes Intravenous Every 8 hours 12/28/22 1116 12/28/22 1503   12/21/22 0707  ceFAZolin (ANCEF) 3-0.9 GM/100ML-% IVPB       Note to Pharmacy: Blair [facility] B: cabinet override      12/21/22 0707 12/21/22 1914   12/21/22 0600  ceFAZolin (ANCEF) IVPB 2g/100 mL premix  Status:  Discontinued        2 g 200 mL/hr over 30 Minutes Intravenous On call to O.R. 12/21/22 0541 12/21/22 1058       Subjective: Seen and examined at bedside and he states that he did not rest very well yesterday.  Feels okay.  Denied any worsening of his abdominal discomfort or pain.  No nausea or vomiting.  Feels okay and had no more temperatures currently.  No other concerns about this time.  Objective: Vitals:   01/04/23 1142 01/04/23 2004 01/05/23 0413 01/05/23 1146  BP: 137/60 122/70  134/68 134/76  Pulse: 88 100 67 89  Resp: 20 20 19  (!) 21  Temp: 98.7 F (37.1 C) 98.9 F (37.2 C) 99.3 F (37.4 C) 98.2 F (36.8 C)  TempSrc: Oral Oral Oral Oral  SpO2: 95% 93% 91% 97%  Weight:      Height:        Intake/Output Summary (Last 24 hours) at 01/05/2023 1544 Last data filed at 01/05/2023 1210 Gross per 24 hour  Intake 2290.05  ml  Output 3215 ml  Net -924.95 ml   Filed Weights   12/21/22 0543  Weight: (!) 235 kg   Examination: Physical Exam:  Constitutional: WN/WD morbidly obese Caucasian male who appears calm in no acute distress Respiratory: Diminished to auscultation bilaterally with some coarse breath sounds, no wheezing, rales, rhonchi or crackles. Normal respiratory effort and patient is not tachypenic. No accessory muscle use.  Unlabored breathing Cardiovascular: RRR, no murmurs / rubs / gallops. S1 and S2 auscultated.  Has some 1+ lower extremity edema Abdomen: Soft, tender to palpate.  Biliary drain in place and his abdominal scars from his prior surgery distended secondary to body habitus.. Bowel sounds positive.  GU: Deferred. Musculoskeletal: No clubbing / cyanosis of digits/nails. No joint deformity upper and lower extremities. Skin: No rashes, lesions, ulcers on limited skin evaluation. No induration; Warm and dry.  Neurologic: CN 2-12 grossly intact with no focal deficits. Romberg sign and cerebellar reflexes not assessed.  Psychiatric: Normal judgment and insight. Alert and oriented x 3. Normal mood and appropriate affect.   Data Reviewed: I have personally reviewed following labs and imaging studies  CBC: Recent Labs  Lab 01/01/23 0500 01/02/23 0341 01/03/23 0325 01/04/23 0445 01/05/23 0419  WBC 25.0* 20.4* 15.9* 17.7* 17.5*  NEUTROABS  --   --   --  11.0* 11.5*  HGB 10.3* 10.3* 11.1* 11.4* 11.4*  HCT 33.9* 33.4* 35.9* 36.4* 35.8*  MCV 91.9 90.5 90.0 90.8 89.7  PLT 169 177 177 205 220   Basic Metabolic Panel: Recent Labs  Lab  12/30/22 0707 12/31/22 0618 01/01/23 0500 01/02/23 0341 01/03/23 0324 01/03/23 0325 01/04/23 0445 01/05/23 0419  NA 134*   < > 134* 140  --  138 136 134*  K 4.1   < > 3.8 3.5  --  3.0* 3.2* 3.7  CL 99   < > 98 103  --  101 100 97*  CO2 19*   < > 22 25  --  28 27 29   GLUCOSE 107*   < > 101* 111*  --  145* 117* 113*  BUN 44*   < > 64* 58*  --  43* 22* 16  CREATININE 3.59*   < > 2.39* 1.34*  --  0.72 0.45* 0.55*  CALCIUM 7.4*   < > 8.2* 8.2*  --  7.9* 7.6* 7.4*  MG 1.8  --   --   --  2.1  --  1.5* 1.7  PHOS 4.8*  --   --   --  1.8*  --  1.9* 2.6   < > = values in this interval not displayed.   GFR: Estimated Creatinine Clearance: 239.1 mL/min (A) (by C-G formula based on SCr of 0.55 mg/dL (L)). Liver Function Tests: Recent Labs  Lab 12/31/22 0618 01/01/23 0500 01/02/23 0341 01/04/23 0445 01/05/23 0419  AST 20 17 14* 12* 18  ALT 34 34 28 16 13   ALKPHOS 105 110 106 68 73  BILITOT 1.0 0.7 0.8 0.8 1.0  PROT 6.2* 6.2* 6.0* 5.6* 5.5*  ALBUMIN 2.4* 2.3* 2.3* 2.1* 2.1*   Recent Labs  Lab 12/30/22 0938  LIPASE 14   No results for input(s): "AMMONIA" in the last 168 hours. Coagulation Profile: Recent Labs  Lab 12/30/22 0707  INR 1.1   Cardiac Enzymes: No results for input(s): "CKTOTAL", "CKMB", "CKMBINDEX", "TROPONINI" in the last 168 hours. BNP (last 3 results) No results for input(s): "PROBNP" in the last 8760 hours. HbA1C: No results for input(s): "HGBA1C"  in the last 72 hours. CBG: Recent Labs  Lab 01/04/23 2000 01/04/23 2335 01/05/23 0410 01/05/23 0725 01/05/23 1147  GLUCAP 132* 122* 117* 90 101*   Lipid Profile: No results for input(s): "CHOL", "HDL", "LDLCALC", "TRIG", "CHOLHDL", "LDLDIRECT" in the last 72 hours. Thyroid Function Tests: No results for input(s): "TSH", "T4TOTAL", "FREET4", "T3FREE", "THYROIDAB" in the last 72 hours. Anemia Panel: No results for input(s): "VITAMINB12", "FOLATE", "FERRITIN", "TIBC", "IRON", "RETICCTPCT" in the last 72  hours. Sepsis Labs: Recent Labs  Lab 01/04/23 0445  PROCALCITON 3.33  LATICACIDVEN 0.8   Recent Results (from the past 240 hour(s))  MRSA Next Gen by PCR, Nasal     Status: Abnormal   Collection Time: 12/28/22 11:23 AM   Specimen: Nasal Mucosa; Nasal Swab  Result Value Ref Range Status   MRSA by PCR Next Gen DETECTED (A) NOT DETECTED Final    Comment: RESULT CALLED TO, READ BACK BY AND VERIFIED WITH: B.FOLEY, RN AT 1543 ON 09.12.24 BY N.THOMPSON (NOTE) The GeneXpert MRSA Assay (FDA approved for NASAL specimens only), is one component of a comprehensive MRSA colonization surveillance program. It is not intended to diagnose MRSA infection nor to guide or monitor treatment for MRSA infections. Test performance is not FDA approved in patients less than 4 years old. Performed at [facility], [address], [place], [place] [address]   Culture, blood (x 2)     Status: None   Collection Time: 12/28/22 11:47 AM   Specimen: BLOOD RIGHT ARM  Result Value Ref Range Status   Specimen Description   Final    BLOOD RIGHT ARM Performed at [facility], [address], [place], [place] [address]    Special Requests   Final    BOTTLES DRAWN AEROBIC AND ANAEROBIC Blood Culture adequate volume Performed at [facility], [address] [place], [place] [address]    Culture   Final    NO GROWTH 5 DAYS Performed at [facility], [address], [place], [place] [address]    Report Status 01/02/2023 FINAL  Final  Culture, blood (x 2)     Status: Abnormal   Collection Time: 12/28/22 11:47 AM   Specimen: BLOOD RIGHT HAND  Result Value Ref Range Status   Specimen Description   Final    BLOOD RIGHT HAND Performed at [facility], [address], [place], [place] [address]    Special Requests   Final    BOTTLES DRAWN AEROBIC ONLY Blood Culture adequate volume Performed at [facility], [address]  [place], [place] [address]    Culture  Setup Time   Final    GRAM POSITIVE COCCI IN CLUSTERS AEROBIC BOTTLE ONLY CRITICAL RESULT CALLED TO, READ BACK BY AND VERIFIED WITH: PHARMD N.GOOGOVAC AT 1401 ON 12/29/2022 BY T.SAAD.    Culture (A)  Final    STAPHYLOCOCCUS EPIDERMIDIS THE SIGNIFICANCE OF ISOLATING THIS ORGANISM FROM A SINGLE SET OF BLOOD CULTURES WHEN MULTIPLE SETS ARE DRAWN IS UNCERTAIN. PLEASE NOTIFY THE MICROBIOLOGY DEPARTMENT WITHIN ONE WEEK IF SPECIATION AND SENSITIVITIES ARE REQUIRED. Performed at [facility], [address], [place], [place] [address]    Report Status 12/30/2022 FINAL  Final  Blood Culture ID Panel (Reflexed)     Status: Abnormal   Collection Time: 12/28/22 11:47 AM  Result Value Ref Range Status   Enterococcus faecalis NOT DETECTED NOT DETECTED Final   Enterococcus Faecium NOT DETECTED NOT DETECTED Final  Listeria monocytogenes NOT DETECTED NOT DETECTED Final   Staphylococcus species DETECTED (A) NOT DETECTED Final    Comment: CRITICAL RESULT CALLED TO, READ BACK BY AND VERIFIED WITH: PHARMD N.GOOGOVAC AT 1401 ON 12/29/2022 BY T.SAAD.    Staphylococcus aureus (BCID) NOT DETECTED NOT DETECTED Final   Staphylococcus epidermidis DETECTED (A) NOT DETECTED Final    Comment: CRITICAL RESULT CALLED TO, READ BACK BY AND VERIFIED WITH: PHARMD N.GOOGOVAC AT 1401 ON 12/29/2022 BY T.SAAD.    Staphylococcus lugdunensis NOT DETECTED NOT DETECTED Final   Streptococcus species NOT DETECTED NOT DETECTED Final   Streptococcus agalactiae NOT DETECTED NOT DETECTED Final   Streptococcus pneumoniae NOT DETECTED NOT DETECTED Final   Streptococcus pyogenes NOT DETECTED NOT DETECTED Final   A.calcoaceticus-baumannii NOT DETECTED NOT DETECTED Final   Bacteroides fragilis NOT DETECTED NOT DETECTED Final   Enterobacterales NOT DETECTED NOT DETECTED Final   Enterobacter cloacae complex NOT DETECTED NOT DETECTED Final   Escherichia coli NOT DETECTED NOT DETECTED Final    Klebsiella aerogenes NOT DETECTED NOT DETECTED Final   Klebsiella oxytoca NOT DETECTED NOT DETECTED Final   Klebsiella pneumoniae NOT DETECTED NOT DETECTED Final   Proteus species NOT DETECTED NOT DETECTED Final   Salmonella species NOT DETECTED NOT DETECTED Final   Serratia marcescens NOT DETECTED NOT DETECTED Final   Haemophilus influenzae NOT DETECTED NOT DETECTED Final   Neisseria meningitidis NOT DETECTED NOT DETECTED Final   Pseudomonas aeruginosa NOT DETECTED NOT DETECTED Final   Stenotrophomonas maltophilia NOT DETECTED NOT DETECTED Final   Candida albicans NOT DETECTED NOT DETECTED Final   Candida auris NOT DETECTED NOT DETECTED Final   Candida glabrata NOT DETECTED NOT DETECTED Final   Candida krusei NOT DETECTED NOT DETECTED Final   Candida parapsilosis NOT DETECTED NOT DETECTED Final   Candida tropicalis NOT DETECTED NOT DETECTED Final   Cryptococcus neoformans/gattii NOT DETECTED NOT DETECTED Final   Methicillin resistance mecA/C NOT DETECTED NOT DETECTED Final    Comment: Performed at [facility], [address], [place], [place] [address]  Culture, blood (Routine X 2) w Reflex to ID Panel     Status: None (Preliminary result)   Collection Time: 01/04/23 11:29 AM   Specimen: BLOOD RIGHT HAND  Result Value Ref Range Status   Specimen Description   Final    BLOOD RIGHT HAND Performed at [facility], [address], [place], [place] [address]    Special Requests   Final    BOTTLES DRAWN AEROBIC AND ANAEROBIC Blood Culture adequate volume Performed at [facility], [address] [place], [place] [address]    Culture   Final    NO GROWTH < 24 HOURS Performed at [facility], [address], [place], [place] [address]    Report Status PENDING  Incomplete    Radiology Studies: DG CHEST PORT 1 VIEW  Result Date: 01/04/2023 CLINICAL DATA:  Leukocytosis EXAM: PORTABLE CHEST 1 VIEW COMPARISON:  12/29/2022 FINDINGS: Previously seen  central venous catheter has been removed. Stable cardiomegaly with pulmonary vascular congestion. Increasing streaky bibasilar opacities. No pleural effusion or pneumothorax. IMPRESSION: 1. Increasing streaky bibasilar opacities, which may represent atelectasis or infection. 2. Cardiomegaly with pulmonary vascular congestion. Electronically Signed   By: Duanne Guess D.O.   On: 01/04/2023 14:17    Scheduled Meds:  allopurinol  100 mg Oral Daily   bisacodyl  10 mg Rectal Daily   Chlorhexidine Gluconate Cloth  6 each Topical Daily   colchicine  0.6 mg Oral Daily   gabapentin  300 mg Oral QID   Gerhardt's butt cream   Topical BID   guaiFENesin  1,200 mg Oral BID   heparin injection (subcutaneous)  5,000 Units Subcutaneous Q8H   hydrocortisone  1 Application Rectal BID   insulin aspart  0-20 Units Subcutaneous Q4H   lidocaine  1 patch Transdermal Daily   nystatin   Topical BID   pantoprazole (PROTONIX) IV  40 mg Intravenous Q12H   Ensure Max Protein  11 oz Oral Daily   Continuous Infusions:  sodium chloride Stopped (12/29/22 0059)   sodium chloride Stopped (01/03/23 1522)   ceFEPime (MAXIPIME) IV 2 g (01/05/23 1226)   methocarbamol (ROBAXIN) IV Stopped (01/02/23 0306)   metronidazole 500 mg (01/05/23 1113)   ondansetron (ZOFRAN) IV      LOS: 14 days   Marguerita Merles, DO [facility] Available via Epic secure chat 7am-7pm After these hours, please refer to coverage provider listed on [email or web address] 01/05/2023, 3:44 PM

## 2023-01-05 NOTE — Evaluation (Signed)
Occupational Therapy Evaluation Patient Details Name: [name] MRN: [id number] DOB: May 20, 1985 Today's Date: 01/05/2023   History of Present Illness Pt is a 37 yo male admitted 12/21/22 from the SNF he lives in with abdominal pain, s/p partial chole, biliary leak and persistant leukocytosis.  PMH: erosive esophagitis, biliary colic, acute blood loss anemia, gastric ulcer with hemorrhage, gout, UTIs, morbid obesity.  Pt needs Bipap but refused early in hospitalization. Refusing to eat.   Clinical Impression   Pt admitted with the above diagnosis and has the deficits outlined below. Pt would benefit from a OT trial to attempt to increase independence with basic adls and adl transfers.  Prior to admission pt was bedbound and totally dependent for most adls. Mid summer pt was able to do most simple adls from bed level and laterally scooted into w/o in May without assist but has not since then.  Pt has become very weak and refuses a lot of activity but also has a lot of joint pain with mobility and pain in abdomen. Will continue to encourage participation to attempt to allow pt to participate in his own self care.  Pt will need less than 3 hours of therapy day in rehab setting of some sort before being able to return home. Pt states he has lived in his SNF since April. Will continue to follow.       If plan is discharge home, recommend the following: Two people to help with walking and/or transfers;Two people to help with bathing/dressing/bathroom;Assistance with cooking/housework;Assistance with feeding;Assist for transportation;Help with stairs or ramp for entrance    Functional Status Assessment  Patient has had a recent decline in their functional status and demonstrates the ability to make significant improvements in function in a reasonable and predictable amount of time.  Equipment Recommendations  None recommended by OT    Recommendations for Other Services       Precautions /  Restrictions Precautions Precautions: Fall Precaution Comments: bed bound for past 3 months Restrictions Weight Bearing Restrictions: No Other Position/Activity Restrictions: pain with any movement of joints in hands.      Mobility Bed Mobility Overal bed mobility: Needs Assistance Bed Mobility: Rolling Rolling: Max assist, +2 for physical assistance         General bed mobility comments: Pt very dependent and has been for months. Pain limiting most mobility at this time as well as pt self limiting mobility.    Transfers Overall transfer level: Needs assistance                 General transfer comment: deferred transfers. Pt refusing to sit EOB.      Balance                                           ADL either performed or assessed with clinical judgement   ADL Overall ADL's : Needs assistance/impaired Eating/Feeding: Minimal assistance;Sitting;Bed level Eating/Feeding Details (indicate cue type and reason): Pt must be positioned in the bed in a postion that allows him to feed himself. This bed makes that very difficult therefore pt requires some assist.  Pt is currently not eating much.  Pt states he chooses not to order food or eat. Will speak to nursing about nutrition. Grooming: Wash/dry hands;Wash/dry face;Oral care;Applying deodorant;Minimal assistance;Bed level Grooming Details (indicate cue type and reason): Pt requires some assist with set up and  encouragement to participate. Pain in hands limits opening small containers and holding onto objects.  Pt may benefit from built up handles. Upper Body Bathing: Maximal assistance;Bed level   Lower Body Bathing: Total assistance;+2 for physical assistance;Bed level   Upper Body Dressing : Maximal assistance;Sitting   Lower Body Dressing: Total assistance;+2 for physical assistance;Bed level   Toilet Transfer: Total assistance;+2 for physical assistance   Toileting- Clothing Manipulation and  Hygiene: Total assistance;+2 for physical assistance;Bed level       Functional mobility during ADLs: Total assistance;+2 for physical assistance General ADL Comments: Pt limited to bed level only. Encouraged pt to get EOB and pt refusing EOB at this time.     Vision Baseline Vision/History: 0 No visual deficits Ability to See in Adequate Light: 0 Adequate Patient Visual Report: No change from baseline Vision Assessment?: No apparent visual deficits     Perception Perception: Within Functional Limits       Praxis Praxis: WFL       Pertinent Vitals/Pain Pain Assessment Pain Assessment: 0-10 Pain Score: 9  Pain Location: head, stomach, hands and feet Pain Descriptors / Indicators: Aching, Grimacing, Sore Pain Intervention(s): Limited activity within patient's tolerance, Patient requesting pain meds-RN notified, Repositioned, Monitored during session     Extremity/Trunk Assessment Upper Extremity Assessment Upper Extremity Assessment: Right hand dominant;RUE deficits/detail;LUE deficits/detail RUE Deficits / Details: Limited in finger MCP and PCP joints of digits 1-3 due to gout and arthritis. Pt states it has been this way for years. Strength shoulder/elbow 3+/5 RUE: Unable to fully assess due to pain RUE Sensation: WNL RUE Coordination: decreased fine motor;decreased gross motor LUE Deficits / Details: Limited in ROM of MCP and PCP joints due to gout and arththritis.  It has been this way for years. Contracted in elbow; unable to get full extension.  Strength shoulders and elbow 3+/5 LUE: Unable to fully assess due to pain LUE Sensation: WNL LUE Coordination: decreased fine motor;decreased gross motor   Lower Extremity Assessment Lower Extremity Assessment: Defer to PT evaluation   Cervical / Trunk Assessment Cervical / Trunk Assessment: Other exceptions (back pain chronically)   Communication Communication Communication: No apparent difficulties   Cognition Arousal:  Alert Behavior During Therapy: Anxious Overall Cognitive Status: Within Functional Limits for tasks assessed                                 General Comments: Pt refusing to do most mobility including rolling or EOB at this time.     General Comments  Pt has been very dependent for some time now and seems to be refusing a lot of care. May attempt to see pt with PT for cotreat if pt will allow EOB.    Exercises Exercises: General Upper Extremity General Exercises - Upper Extremity Shoulder Flexion: AAROM, Both, 10 reps, Supine Shoulder Extension: AAROM, Both, 10 reps, Supine Elbow Flexion: AAROM, Both, 10 reps, Supine Elbow Extension: AAROM, Both, 10 reps, Supine   Shoulder Instructions      Home Living Family/patient expects to be discharged to:: Skilled nursing facility                                 Additional Comments: Lives in [facility] since April.  Before that lived with friend and was more independent.      Prior Functioning/Environment Prior Level of Function :  Needs assist       Physical Assist : Mobility (physical);ADLs (physical) Mobility (physical): Bed mobility;Transfers ADLs (physical): Toileting;IADLs Mobility Comments: Was walking with quad-cane for household and short distanced community prior to going to SNF for rehab. Has not walked since May. States he used to laterally scoot into w/c but has not done that since he first got to SNF. ADLs Comments: Pt bathes and dresses in bed without help. Pt uses urinal and bed pan with help but had been unable to do anything for last month.        OT Problem List: Decreased strength;Decreased range of motion;Decreased activity tolerance;Impaired balance (sitting and/or standing);Decreased coordination;Decreased knowledge of use of DME or AE;Decreased knowledge of precautions;Obesity;Impaired UE functional use;Pain;Increased edema      OT Treatment/Interventions: Self-care/ADL  training;Therapeutic activities;Energy conservation    OT Goals(Current goals can be found in the care plan section) Acute Rehab OT Goals Patient Stated Goal: to not hurt OT Goal Formulation: With patient Time For Goal Achievement: 01/19/23 Potential to Achieve Goals: Fair ADL Goals Pt Will Perform Eating: with set-up;bed level;sitting Pt Will Perform Grooming: with set-up;sitting;bed level Pt Will Perform Upper Body Bathing: with set-up;bed level;sitting Pt Will Perform Lower Body Bathing: with mod assist;bed level Pt Will Perform Upper Body Dressing: with set-up;bed level;sitting Additional ADL Goal #1: Pt will tolerate sitting EOB for 5 minutes with min assist in prep for adls on EOB.  OT Frequency: Min 1X/week    Co-evaluation              AM-PAC OT "6 Clicks" Daily Activity     Outcome Measure Help from another person eating meals?: A Little Help from another person taking care of personal grooming?: A Little Help from another person toileting, which includes using toliet, bedpan, or urinal?: Total Help from another person bathing (including washing, rinsing, drying)?: Total Help from another person to put on and taking off regular upper body clothing?: A Lot Help from another person to put on and taking off regular lower body clothing?: Total 6 Click Score: 11   End of Session Nurse Communication: Mobility status;Other (comment);Patient requests pain meds (pt states he is not eating)  Activity Tolerance: Patient limited by pain Patient left: in bed;with call bell/phone within reach  OT Visit Diagnosis: Other abnormalities of gait and mobility (R26.89)                Time: 1610-9604 OT Time Calculation (min): 21 min Charges:  OT General Charges $OT Visit: 1 Visit OT Evaluation $OT Eval Moderate Complexity: 1 Mod  Hope Budds 01/05/2023, 9:31 AM

## 2023-01-05 NOTE — Progress Notes (Signed)
   01/05/23 1944  BiPAP/CPAP/SIPAP  Reason BIPAP/CPAP not in use Non-compliant (Pt refused again tonight. Pt is currently on 2L [place] no resp distress noted at this time)  BiPAP/CPAP /SiPAP Vitals  Resp (!) 24  MEWS Score/Color  MEWS Score 1  MEWS Score Color [name]

## 2023-01-06 ENCOUNTER — Inpatient Hospital Stay (HOSPITAL_COMMUNITY): Payer: Medicaid Other

## 2023-01-06 DIAGNOSIS — R197 Diarrhea, unspecified: Secondary | ICD-10-CM

## 2023-01-06 DIAGNOSIS — Z7401 Bed confinement status: Secondary | ICD-10-CM | POA: Diagnosis not present

## 2023-01-06 DIAGNOSIS — N179 Acute kidney failure, unspecified: Secondary | ICD-10-CM | POA: Diagnosis not present

## 2023-01-06 DIAGNOSIS — K8010 Calculus of gallbladder with chronic cholecystitis without obstruction: Secondary | ICD-10-CM | POA: Diagnosis not present

## 2023-01-06 DIAGNOSIS — J9601 Acute respiratory failure with hypoxia: Secondary | ICD-10-CM | POA: Diagnosis not present

## 2023-01-06 LAB — CBC WITH DIFFERENTIAL/PLATELET
Abs Immature Granulocytes: 1.04 10*3/uL — ABNORMAL HIGH (ref 0.00–0.07)
Basophils Absolute: 0 10*3/uL (ref 0.0–0.1)
Basophils Relative: 0 %
Eosinophils Absolute: 0.2 10*3/uL (ref 0.0–0.5)
Eosinophils Relative: 1 %
HCT: 35.9 % — ABNORMAL LOW (ref 39.0–52.0)
Hemoglobin: 11 g/dL — ABNORMAL LOW (ref 13.0–17.0)
Immature Granulocytes: 7 %
Lymphocytes Relative: 11 %
Lymphs Abs: 1.7 10*3/uL (ref 0.7–4.0)
MCH: 28.2 pg (ref 26.0–34.0)
MCHC: 30.6 g/dL (ref 30.0–36.0)
MCV: 92.1 fL (ref 80.0–100.0)
Monocytes Absolute: 1.4 10*3/uL — ABNORMAL HIGH (ref 0.1–1.0)
Monocytes Relative: 9 %
Neutro Abs: 11.4 10*3/uL — ABNORMAL HIGH (ref 1.7–7.7)
Neutrophils Relative %: 72 %
Platelets: 240 10*3/uL (ref 150–400)
RBC: 3.9 MIL/uL — ABNORMAL LOW (ref 4.22–5.81)
RDW: 14.1 % (ref 11.5–15.5)
WBC: 15.8 10*3/uL — ABNORMAL HIGH (ref 4.0–10.5)
nRBC: 0 % (ref 0.0–0.2)

## 2023-01-06 LAB — COMPREHENSIVE METABOLIC PANEL
ALT: 11 U/L (ref 0–44)
AST: 11 U/L — ABNORMAL LOW (ref 15–41)
Albumin: 2.1 g/dL — ABNORMAL LOW (ref 3.5–5.0)
Alkaline Phosphatase: 64 U/L (ref 38–126)
Anion gap: 9 (ref 5–15)
BUN: 11 mg/dL (ref 6–20)
CO2: 28 mmol/L (ref 22–32)
Calcium: 7.7 mg/dL — ABNORMAL LOW (ref 8.9–10.3)
Chloride: 100 mmol/L (ref 98–111)
Creatinine, Ser: 0.38 mg/dL — ABNORMAL LOW (ref 0.61–1.24)
GFR, Estimated: >60 mL/min (ref >60)
Glucose, Bld: 113 mg/dL — ABNORMAL HIGH (ref 70–99)
Potassium: 3.6 mmol/L (ref 3.5–5.1)
Sodium: 137 mmol/L (ref 135–145)
Total Bilirubin: 0.7 mg/dL (ref 0.3–1.2)
Total Protein: 5.3 g/dL — ABNORMAL LOW (ref 6.5–8.1)

## 2023-01-06 LAB — PHOSPHORUS: Phosphorus: 2.9 mg/dL (ref 2.5–4.6)

## 2023-01-06 LAB — IRON AND TIBC
Iron: 26 ug/dL — ABNORMAL LOW (ref 45–182)
Saturation Ratios: 14 % — ABNORMAL LOW (ref 17.9–39.5)
TIBC: 192 ug/dL — ABNORMAL LOW (ref 250–450)
UIBC: 166 ug/dL

## 2023-01-06 LAB — FOLATE: Folate: 8.3 ng/mL (ref >5.9)

## 2023-01-06 LAB — GLUCOSE, CAPILLARY
Glucose-Capillary: 100 mg/dL — ABNORMAL HIGH (ref 70–99)
Glucose-Capillary: 111 mg/dL — ABNORMAL HIGH (ref 70–99)
Glucose-Capillary: 113 mg/dL — ABNORMAL HIGH (ref 70–99)
Glucose-Capillary: 117 mg/dL — ABNORMAL HIGH (ref 70–99)
Glucose-Capillary: 147 mg/dL — ABNORMAL HIGH (ref 70–99)
Glucose-Capillary: 91 mg/dL (ref 70–99)

## 2023-01-06 LAB — RETICULOCYTES
Immature Retic Fract: 31.1 % — ABNORMAL HIGH (ref 2.3–15.9)
RBC.: 3.85 MIL/uL — ABNORMAL LOW (ref 4.22–5.81)
Retic Count, Absolute: 47 10*3/uL (ref 19.0–186.0)
Retic Ct Pct: 1.2 % (ref 0.4–3.1)

## 2023-01-06 LAB — VITAMIN B12: Vitamin B-12: 633 pg/mL (ref 180–914)

## 2023-01-06 LAB — FERRITIN: Ferritin: 530 ng/mL — ABNORMAL HIGH (ref 24–336)

## 2023-01-06 LAB — MAGNESIUM: Magnesium: 1.7 mg/dL (ref 1.7–2.4)

## 2023-01-06 MED ORDER — LOPERAMIDE HCL 2 MG PO CAPS
2.0000 mg | ORAL_CAPSULE | Freq: Two times a day (BID) | ORAL | Status: AC
  Administered 2023-01-06 – 2023-01-07 (×4): 2 mg via ORAL
  Filled 2023-01-06 (×4): qty 1

## 2023-01-06 MED ORDER — FUROSEMIDE 10 MG/ML IJ SOLN
40.0000 mg | Freq: Once | INTRAMUSCULAR | Status: DC
Start: 2023-01-06 — End: 2023-01-06

## 2023-01-06 MED ORDER — IOHEXOL 300 MG/ML  SOLN
100.0000 mL | Freq: Once | INTRAMUSCULAR | Status: AC | PRN
  Administered 2023-01-06: 100 mL via INTRAVENOUS

## 2023-01-06 MED ORDER — SODIUM CHLORIDE (PF) 0.9 % IJ SOLN
INTRAMUSCULAR | Status: AC
  Filled 2023-01-06: qty 50

## 2023-01-06 NOTE — Progress Notes (Signed)
16 Days Post-Op   Subjective/Chief Complaint: Patient reports diarrhea now Refuses rectal tube  CT held apparently until new IV placed Drain 90 cc last 24 hours   Objective: Vital signs in last 24 hours: Temp:  [98.2 F (36.8 C)-98.4 F (36.9 C)] 98.4 F (36.9 C) (09/21 0410) Pulse Rate:  [89-96] 92 (09/21 0410) Resp:  [20-24] 20 (09/21 0410) BP: (118-134)/(61-76) 118/61 (09/21 0410) SpO2:  [97 %] 97 % (09/21 0410) Last BM Date : 01/06/23  Intake/Output from previous day: 09/20 0701 - 09/21 0700 In: 470 [P.O.:470] Out: 1490 [Urine:1400; Drains:90] Intake/Output this shift: No intake/output data recorded.  Exam: Awake and alert NAD Abdomen obese, drain with very light bile  Lab Results:  Recent Labs    01/05/23 0419 01/06/23 0720  WBC 17.5* 15.8*  HGB 11.4* 11.0*  HCT 35.8* 35.9*  PLT 220 240   BMET Recent Labs    01/05/23 0419 01/06/23 0720  NA 134* 137  K 3.7 3.6  CL 97* 100  CO2 29 28  GLUCOSE 113* 113*  BUN 16 11  CREATININE 0.55* 0.38*  CALCIUM 7.4* 7.7*   PT/INR No results for input(s): "LABPROT", "INR" in the last 72 hours. ABG No results for input(s): "PHART", "HCO3" in the last 72 hours.  Invalid input(s): "PCO2", "PO2"  Studies/Results: DG CHEST PORT 1 VIEW  Result Date: 01/04/2023 CLINICAL DATA:  Leukocytosis EXAM: PORTABLE CHEST 1 VIEW COMPARISON:  12/29/2022 FINDINGS: Previously seen central venous catheter has been removed. Stable cardiomegaly with pulmonary vascular congestion. Increasing streaky bibasilar opacities. No pleural effusion or pneumothorax. IMPRESSION: 1. Increasing streaky bibasilar opacities, which may represent atelectasis or infection. 2. Cardiomegaly with pulmonary vascular congestion. Electronically Signed   By: Duanne Guess D.O.   On: 01/04/2023 14:17    Anti-infectives: Anti-infectives (From admission, onward)    Start     Dose/Rate Route Frequency Ordered Stop   01/01/23 2000  ceFEPIme (MAXIPIME) 2 g in  sodium chloride 0.9 % 100 mL IVPB        2 g 200 mL/hr over 30 Minutes Intravenous Every 8 hours 01/01/23 1311     12/31/22 1245  linezolid (ZYVOX) IVPB 600 mg  Status:  Discontinued        600 mg 300 mL/hr over 60 Minutes Intravenous 2 times daily 12/31/22 1145 01/02/23 1146   12/29/22 2200  ceFEPIme (MAXIPIME) 2 g in sodium chloride 0.9 % 100 mL IVPB  Status:  Discontinued        2 g 200 mL/hr over 30 Minutes Intravenous Every 12 hours 12/28/22 2127 12/29/22 1401   12/29/22 1500  ceFEPIme (MAXIPIME) 2 g in sodium chloride 0.9 % 100 mL IVPB  Status:  Discontinued        2 g 200 mL/hr over 30 Minutes Intravenous 2 times daily 12/29/22 1401 01/01/23 1311   12/29/22 1215  linezolid (ZYVOX) IVPB 600 mg  Status:  Discontinued        600 mg 300 mL/hr over 60 Minutes Intravenous Every 12 hours 12/29/22 1120 12/31/22 1145   12/29/22 0400  vancomycin (VANCOREADY) IVPB 1250 mg/250 mL  Status:  Discontinued        1,250 mg 166.7 mL/hr over 90 Minutes Intravenous Every 12 hours 12/28/22 1512 12/28/22 2127   12/28/22 2126  vancomycin variable dose per unstable renal function (pharmacist dosing)  Status:  Discontinued         Does not apply See admin instructions 12/28/22 2127 12/29/22 1120   12/28/22 2100  ceFEPIme (MAXIPIME) 2 g in sodium chloride 0.9 % 100 mL IVPB  Status:  Discontinued        2 g 200 mL/hr over 30 Minutes Intravenous Every 8 hours 12/28/22 1512 12/28/22 2127   12/28/22 2100  metroNIDAZOLE (FLAGYL) IVPB 500 mg        500 mg 100 mL/hr over 60 Minutes Intravenous Every 12 hours 12/28/22 1512     12/28/22 1600  vancomycin (VANCOREADY) IVPB 2000 mg/400 mL        2,000 mg 200 mL/hr over 120 Minutes Intravenous  Once 12/28/22 1512 12/28/22 1752   12/28/22 1230  piperacillin-tazobactam (ZOSYN) IVPB 3.375 g  Status:  Discontinued        3.375 g 12.5 mL/hr over 240 Minutes Intravenous Every 8 hours 12/28/22 1116 12/28/22 1503   12/21/22 0707  ceFAZolin (ANCEF) 3-0.9 GM/100ML-% IVPB        Note to Pharmacy: [facility]: cabinet override      12/21/22 0707 12/21/22 1914   12/21/22 0600  ceFAZolin (ANCEF) IVPB 2g/100 mL premix  Status:  Discontinued        2 g 200 mL/hr over 30 Minutes Intravenous On call to O.R. 12/21/22 0541 12/21/22 1058       Assessment/Plan: S/p partial cholecystectomy, bile leak, morbid obesity   Drain output decreased WBC down slightly CT abd pending Will start Imodium for diarrhea Continue antibiotics  Abigail Miyamoto 01/06/2023

## 2023-01-06 NOTE — Progress Notes (Signed)
PT Cancellation Note  Patient Details Name: [name] MRN: [id number] DOB: 06-26-1985   Cancelled Treatment:    Reason Eval/Treat Not Completed: Pain limiting ability to participate. Pt again refused to attempt any mobility. He reports he's in too much pain. Please re-order PT when pt is able to participate. PT signing off.    Ralene Bathe Kistler PT 01/06/2023  Acute Rehabilitation Services  Office [phone]

## 2023-01-06 NOTE — Plan of Care (Signed)
  Problem: Coping: Goal: Level of anxiety will decrease Outcome: Progressing   Problem: Skin Integrity: Goal: Risk for impaired skin integrity will decrease Outcome: Progressing   Problem: Pain Managment: Goal: General experience of comfort will improve Outcome: Progressing   Problem: Safety: Goal: Ability to remain free from injury will improve Outcome: Progressing

## 2023-01-06 NOTE — Progress Notes (Signed)
PROGRESS NOTE    [name]  ZOX:[id number] DOB: 1986/01/18 DOA: 12/21/2022 PCP: Toma Deiters, MD   Brief Narrative:  The Patient [name] is a 37 y/o male with obesity, NASH who presented with acute cholecystitis requiring cholecystectomy, which was complicated by his hepatomegaly. He had a bile leak post-op requiring a drain  Significant Hospital Events:    9/5 - Underwent elective lap chole with CCS. Partial cholecystectomy completed in the setting of poor visualization. EBL . Drain left in place. 9/12 - Transferred to SDU for worsening hypoxia, tachypnea, tachycardia. Hypotensive. CT Chest/A/P with postoperative changes, surgical drain in gallbladder fossa, minimal L basilar subsegmental atelectasis, L adrenal adenoma. TRH consulted. Fluids/Zosyn given, broadened to vanc/cefepime/Flagyl. Refused BiPAP/stated he would refuse intubation even if it meant death. Made DNR. Later in shift more hypotensive, PCCM consulted for pressors. Code status changed back to full code. Vasopressors initiated. 9/13 - A-line, LIJ CVC placed. High pressor requirements with NE 38, Neo 70, vaso 0.03. 9/16: neo stopped 9/13. NE and vaso stopped 9/15. Duoneb for wheezing. A-line removed. Transfer to SDU.  9/17: CCM requested TRH to continue to follow for medical management.  Surgery remains primary service.  9/18 -Had some Transient Bradycardia and ? Pause; will replete electrolytes and continue to monitor.  Patient's renal function is improved  9/19 -Spiked a temperature yesterday and so we will obtain blood cultures x 2 and workup with fever given that WBC slightly worsened to 17.7.   9/20 - WBC still about the same. Repeat CT Scan pending. Patient states he did not sleep very well.   9/21 -having quite a bit of diarrhea refuses for his rectal tube to be reinserted.  Appears frustrated and wanting to leave.  Assessment and Plan:  Chronic Calculous Cholecystitis, sepsis from biliary source,  improving slowly Biliary leak, post operatively.  -Currently off vasopressors, sepsis physiology improving but still spiking intermittent temp, BP now elevated -WBC Trend: Recent Labs  Lab 12/31/22 0618 01/01/23 0500 01/02/23 0341 01/03/23 0325 01/04/23 0445 01/05/23 0419 01/06/23 0720  WBC 30.9* 25.0* 20.4* 15.9* 17.7* 17.5* 15.8*  -On broad-spectrum antibiotics, management per general surgery -Continues to spike some intermittent temperatures and had a Tmax of 100.6 a few days ago will need to continue monitor carefully and ordered Blood Cx x2; -Repeat CXR done and showed "Increasing streaky bibasilar opacities, which may represent atelectasis or infection. Cardiomegaly with pulmonary vascular congestion." -U/A done and showed a hazy appearance with small hemoglobin, large leukocytes, negative nitrites, rare bacteria, 11-20 RBC per high-power field and 21-50 WBCs -Repeat CT Scan of the Abdomen and Pelvis pending still -Outpatient complain about significant diarrhea so we will do a GI pathogen panel -Procalcitonin level was 3.33 however on admission it was greater than 150; repeat in the a.m. -Seen by GI this admission, plan and timing for ERCP per GI -Blood cultures 1/2 staph epi, currently on Cefepime and Flagyl; blood cultures x 2 being reobtained and repeat blood culture showing no growth to date at 2 days -Biliary drain in place -Further care per primary General Surgery with pain control -Patient is on Carb Modified Diet now    Acute on chronic respiratory failure with hypoxia -Multifactorial secondary to aggressive fluid resuscitation during shock, OSA, OHS - BiPAP as needed, received IV Lasix 40 mg x 1 on 9/16, 9/17, 9/19 (per PCCM) for fluid overload -Still positive balance of 6.505 L -Holding off giving him a dose of IV Lasix today SpO2: 95 % O2 Flow  Rate (L/min): 2 L/min FiO2 (%): 35 % -Continuous Pulse Oximetry and maintain O2 saturation greater than 90% -Continue  supplemental oxygen via nasal cannula and wean oxygen to room air -Repeat CXR done 01/04/2023 showed "Increasing streaky bibasilar opacities, which may represent atelectasis or infection. Cardiomegaly with pulmonary vascular congestion." -Initiate Flutter Valve, Incentive Spirometry, Guaifenesin 1200 po BID -Continue to monitor oxygen requirements as necessary   Electrolyte Abnormalities including Hypokalemia and Hypophosphatemia and Hypomagnesemia -K, Mag, and Phos Level Trend: Recent Labs  Lab 12/31/22 0618 01/01/23 0500 01/02/23 0341 01/03/23 0324 01/03/23 0325 01/04/23 0445 01/05/23 0419 01/06/23 0720  K 4.6 3.8 3.5  --  3.0* 3.2* 3.7 3.6  MG  --   --   --    < >  --  1.5* 1.7 1.7  PHOS  --   --   --    < >  --  1.9* 2.6 2.9   < > = values in this interval not displayed.  -Replete with IV Mag Sulfate 2 grams again today -Conitnue To monitor replete as necessary repeat CMP in a.m. along with Phos and mag  OHS/OSA -Largely related to habitus, morbid obesity -BiPAP as above  AKI, improved and resolved  -In the setting of hypotension, sepsis.  Likely ATN and combination of obstructive physiology, unable to pass urine prior to Foley placement.   - Status post Foley placement with a cystoscopy 9/14 by Urology -BUN/Cr Trend: Recent Labs  Lab 12/31/22 0618 01/01/23 0500 01/02/23 0341 01/03/23 0325 01/04/23 0445 01/05/23 0419 01/06/23 0720  BUN 58* 64* 58* 43* 22* 16 11  CREATININE 3.45* 2.39* 1.34* 0.72 0.45* 0.55* 0.38*  -Avoid Nephrotoxic Medications, Contrast Dyes, Hypotension and Dehydration to Ensure Adequate Renal Perfusion and will need to Renally Adjust Meds -Follow Renal Fxn Closely with Diuresis and will hold off on giving him another dose of Lasix today -Continue to Monitor and Trend Renal Function carefully and repeat CMP in the AM   Normocytic Anemia -Hgb/Hct Trend: Recent Labs  Lab 12/31/22 0618 01/01/23 0500 01/02/23 0341 01/03/23 0325 01/04/23 0445  01/05/23 0419 01/06/23 0720  HGB 10.5* 10.3* 10.3* 11.1* 11.4* 11.4* 11.0*  HCT 33.9* 33.9* 33.4* 35.9* 36.4* 35.8* 35.9*  MCV 92.4 91.9 90.5 90.0 90.8 89.7 92.1  -Checked Anemia Panel and showed an iron level of 26, UIBC of 166, TIBC 192, saturation ratios of 14%, ferritin level 530, folate level of 8.3, vitamin B12 633 -Continue to monitor for signs and symptoms bleeding; no overt bleeding noted -Repeat CBC in a.m.  Hypoalbuminemia -Patient's Albumin Trend: Recent Labs  Lab 12/30/22 0707 12/31/22 0618 01/01/23 0500 01/02/23 0341 01/04/23 0445 01/05/23 0419 01/06/23 0720  ALBUMIN 2.2* 2.4* 2.3* 2.3* 2.1* 2.1* 2.1*  -Continue to Monitor and Trend and repeat CMP in the AM  Super Morbid Obesity -Complicates overall prognosis and care -Estimated body mass index is 81.14 kg/m as calculated from the following:   Height as of this encounter: 5\' 7"  (1.702 m).   Weight as of this encounter: 235 kg.  -Weight Loss and Dietary Counseling given   DVT prophylaxis: heparin injection 5,000 Units Start: 12/29/22 1430 SCD's Start: 12/21/22 1116    Code Status: Full Code Family Communication: No family at bedside  Disposition Plan:  Level of care: Progressive Status is: Inpatient Remains inpatient appropriate because: Make further clinical improvement and clearance by the primary team and he continues to not feel well and have diarrhea and undergoing a CT scan of the abdomen pelvis today  Consultants:  General Surgery is primary TRH PCCM  Procedures:  As below needed as above  Antimicrobials:  Anti-infectives (From admission, onward)    Start     Dose/Rate Route Frequency Ordered Stop   01/01/23 2000  ceFEPIme (MAXIPIME) 2 g in sodium chloride 0.9 % 100 mL IVPB        2 g 200 mL/hr over 30 Minutes Intravenous Every 8 hours 01/01/23 1311     12/31/22 1245  linezolid (ZYVOX) IVPB 600 mg  Status:  Discontinued        600 mg 300 mL/hr over 60 Minutes Intravenous 2 times daily  12/31/22 1145 01/02/23 1146   12/29/22 2200  ceFEPIme (MAXIPIME) 2 g in sodium chloride 0.9 % 100 mL IVPB  Status:  Discontinued        2 g 200 mL/hr over 30 Minutes Intravenous Every 12 hours 12/28/22 2127 12/29/22 1401   12/29/22 1500  ceFEPIme (MAXIPIME) 2 g in sodium chloride 0.9 % 100 mL IVPB  Status:  Discontinued        2 g 200 mL/hr over 30 Minutes Intravenous 2 times daily 12/29/22 1401 01/01/23 1311   12/29/22 1215  linezolid (ZYVOX) IVPB 600 mg  Status:  Discontinued        600 mg 300 mL/hr over 60 Minutes Intravenous Every 12 hours 12/29/22 1120 12/31/22 1145   12/29/22 0400  vancomycin (VANCOREADY) IVPB 1250 mg/250 mL  Status:  Discontinued        1,250 mg 166.7 mL/hr over 90 Minutes Intravenous Every 12 hours 12/28/22 1512 12/28/22 2127   12/28/22 2126  vancomycin variable dose per unstable renal function (pharmacist dosing)  Status:  Discontinued         Does not apply See admin instructions 12/28/22 2127 12/29/22 1120   12/28/22 2100  ceFEPIme (MAXIPIME) 2 g in sodium chloride 0.9 % 100 mL IVPB  Status:  Discontinued        2 g 200 mL/hr over 30 Minutes Intravenous Every 8 hours 12/28/22 1512 12/28/22 2127   12/28/22 2100  metroNIDAZOLE (FLAGYL) IVPB 500 mg        500 mg 100 mL/hr over 60 Minutes Intravenous Every 12 hours 12/28/22 1512     12/28/22 1600  vancomycin (VANCOREADY) IVPB 2000 mg/400 mL        2,000 mg 200 mL/hr over 120 Minutes Intravenous  Once 12/28/22 1512 12/28/22 1752   12/28/22 1230  piperacillin-tazobactam (ZOSYN) IVPB 3.375 g  Status:  Discontinued        3.375 g 12.5 mL/hr over 240 Minutes Intravenous Every 8 hours 12/28/22 1116 12/28/22 1503   12/21/22 0707  ceFAZolin (ANCEF) 3-0.9 GM/100ML-% IVPB       Note to Pharmacy: [facility] B: cabinet override      12/21/22 0707 12/21/22 1914   12/21/22 0600  ceFAZolin (ANCEF) IVPB 2g/100 mL premix  Status:  Discontinued        2 g 200 mL/hr over 30 Minutes Intravenous On call to O.R. 12/21/22 0541  12/21/22 1058       Subjective: Seen and examined at bedside was frustrated and was adamant about not having his rectal tube yesterday.  States is having quite a bit of diarrhea and states that he is not getting arrested.  Continues have some abdominal discomfort and pain as well.  No nausea.  Denies any chest pain but is wearing supplemental oxygen at this time.  No other concerns or complaints at this time.  Objective:  Vitals:   01/05/23 1944 01/05/23 2018 01/06/23 0410 01/06/23 1231  BP:  120/69 118/61 (!) 111/57  Pulse:  96 92 88  Resp: (!) 24 20 20 20   Temp:  98.4 F (36.9 C) 98.4 F (36.9 C) 98.4 F (36.9 C)  TempSrc:  Oral Oral Oral  SpO2:  97% 97% 95%  Weight:      Height:        Intake/Output Summary (Last 24 hours) at 01/06/2023 1341 Last data filed at 01/06/2023 0415 Gross per 24 hour  Intake 350 ml  Output 1100 ml  Net -750 ml   Filed Weights   12/21/22 0543  Weight: (!) 235 kg   Examination: Physical Exam:  Constitutional: WN/WD super morbidly obese Caucasian male who appears frustrated and a little uncomfortable Respiratory: Diminished to auscultation bilaterally, no wheezing, rales, rhonchi or crackles. Normal respiratory effort and patient is not tachypenic. No accessory muscle use.  Unlabored breathing but he is wearing supplemental oxygen via Cardiovascular: RRR, no murmurs / rubs / gallops. S1 and S2 auscultated.  Mild lower extremity edema Abdomen: Soft, tender to palpation there is distended secondary to body habitus.  Has a biliary drain in place and has abdominal incisions noted. Bowel sounds positive.  GU: Deferred. Musculoskeletal: No clubbing / cyanosis of digits/nails. No joint deformity upper and lower extremities.  Skin: No rashes, lesions, ulcers on a limited evaluation. No induration; Warm and dry.  Neurologic: CN 2-12 grossly intact with no focal deficits. Romberg sign and cerebellar reflexes not assessed.  Psychiatric: Normal judgment and  insight. Alert and oriented x 3.  Appears frustrated and anxious a little bit today  Data Reviewed: I have personally reviewed following labs and imaging studies  CBC: Recent Labs  Lab 01/02/23 0341 01/03/23 0325 01/04/23 0445 01/05/23 0419 01/06/23 0720  WBC 20.4* 15.9* 17.7* 17.5* 15.8*  NEUTROABS  --   --  11.0* 11.5* 11.4*  HGB 10.3* 11.1* 11.4* 11.4* 11.0*  HCT 33.4* 35.9* 36.4* 35.8* 35.9*  MCV 90.5 90.0 90.8 89.7 92.1  PLT 177 177 205 220 240   Basic Metabolic Panel: Recent Labs  Lab 01/02/23 0341 01/03/23 0324 01/03/23 0325 01/04/23 0445 01/05/23 0419 01/06/23 0720  NA 140  --  138 136 134* 137  K 3.5  --  3.0* 3.2* 3.7 3.6  CL 103  --  101 100 97* 100  CO2 25  --  28 27 29 28   GLUCOSE 111*  --  145* 117* 113* 113*  BUN 58*  --  43* 22* 16 11  CREATININE 1.34*  --  0.72 0.45* 0.55* 0.38*  CALCIUM 8.2*  --  7.9* 7.6* 7.4* 7.7*  MG  --  2.1  --  1.5* 1.7 1.7  PHOS  --  1.8*  --  1.9* 2.6 2.9   GFR: Estimated Creatinine Clearance: 239.1 mL/min (A) (by C-G formula based on SCr of 0.38 mg/dL (L)). Liver Function Tests: Recent Labs  Lab 01/01/23 0500 01/02/23 0341 01/04/23 0445 01/05/23 0419 01/06/23 0720  AST 17 14* 12* 18 11*  ALT 34 28 16 13 11   ALKPHOS 110 106 68 73 64  BILITOT 0.7 0.8 0.8 1.0 0.7  PROT 6.2* 6.0* 5.6* 5.5* 5.3*  ALBUMIN 2.3* 2.3* 2.1* 2.1* 2.1*   No results for input(s): "LIPASE", "AMYLASE" in the last 168 hours. No results for input(s): "AMMONIA" in the last 168 hours. Coagulation Profile: No results for input(s): "INR", "PROTIME" in the last 168 hours. Cardiac Enzymes: No results  for input(s): "CKTOTAL", "CKMB", "CKMBINDEX", "TROPONINI" in the last 168 hours. BNP (last 3 results) No results for input(s): "PROBNP" in the last 8760 hours. HbA1C: No results for input(s): "HGBA1C" in the last 72 hours. CBG: Recent Labs  Lab 01/05/23 2008 01/05/23 2328 01/06/23 0407 01/06/23 0737 01/06/23 1223  GLUCAP 141* 124* 111* 147*  117*   Lipid Profile: No results for input(s): "CHOL", "HDL", "LDLCALC", "TRIG", "CHOLHDL", "LDLDIRECT" in the last 72 hours. Thyroid Function Tests: No results for input(s): "TSH", "T4TOTAL", "FREET4", "T3FREE", "THYROIDAB" in the last 72 hours. Anemia Panel: Recent Labs    01/06/23 0720  VITAMINB12 633  FOLATE 8.3  FERRITIN 530*  TIBC 192*  IRON 26*  RETICCTPCT 1.2   Sepsis Labs: Recent Labs  Lab 01/04/23 0445  PROCALCITON 3.33  LATICACIDVEN 0.8   Recent Results (from the past 240 hour(s))  MRSA Next Gen by PCR, Nasal     Status: Abnormal   Collection Time: 12/28/22 11:23 AM   Specimen: Nasal Mucosa; Nasal Swab  Result Value Ref Range Status   MRSA by PCR Next Gen DETECTED (A) NOT DETECTED Final    Comment: RESULT CALLED TO, READ BACK BY AND VERIFIED WITH: B.FOLEY, RN AT 1543 ON 09.12.24 BY N.THOMPSON (NOTE) The GeneXpert MRSA Assay (FDA approved for NASAL specimens only), is one component of a comprehensive MRSA colonization surveillance program. It is not intended to diagnose MRSA infection nor to guide or monitor treatment for MRSA infections. Test performance is not FDA approved in patients less than 1 years old. Performed at [facility], [address], [place], [place] [address]   Culture, blood (x 2)     Status: None   Collection Time: 12/28/22 11:47 AM   Specimen: BLOOD RIGHT ARM  Result Value Ref Range Status   Specimen Description   Final    BLOOD RIGHT ARM Performed at [facility], [address] [place], [place] [address]    Special Requests   Final    BOTTLES DRAWN AEROBIC AND ANAEROBIC Blood Culture adequate volume Performed at [facility], [address], [place], [place] [address]    Culture   Final    NO GROWTH 5 DAYS Performed at [facility], [address] [place], [place] [address]    Report Status 01/02/2023 FINAL  Final  Culture, blood (x 2)     Status: Abnormal   Collection  Time: 12/28/22 11:47 AM   Specimen: BLOOD RIGHT HAND  Result Value Ref Range Status   Specimen Description   Final    BLOOD RIGHT HAND Performed at [facility], [address] [place], [place] [address]    Special Requests   Final    BOTTLES DRAWN AEROBIC ONLY Blood Culture adequate volume Performed at [facility], [address] [place], [place] [address]    Culture  Setup Time   Final    GRAM POSITIVE COCCI IN CLUSTERS AEROBIC BOTTLE ONLY CRITICAL RESULT CALLED TO, READ BACK BY AND VERIFIED WITH: PHARMD N.GOOGOVAC AT 1401 ON 12/29/2022 BY T.SAAD.    Culture (A)  Final    STAPHYLOCOCCUS EPIDERMIDIS THE SIGNIFICANCE OF ISOLATING THIS ORGANISM FROM A SINGLE SET OF BLOOD CULTURES WHEN MULTIPLE SETS ARE DRAWN IS UNCERTAIN. PLEASE NOTIFY THE MICROBIOLOGY DEPARTMENT WITHIN ONE WEEK IF SPECIATION AND SENSITIVITIES ARE REQUIRED. Performed at [facility], [address], [place], [place] [address]    Report Status 12/30/2022 FINAL  Final  Blood Culture ID Panel (Reflexed)     Status: Abnormal   Collection Time: 12/28/22 11:47 AM  Result Value Ref Range Status   Enterococcus faecalis NOT DETECTED NOT DETECTED Final   Enterococcus Faecium NOT DETECTED NOT DETECTED Final   Listeria monocytogenes NOT DETECTED NOT DETECTED Final   Staphylococcus species DETECTED (A) NOT DETECTED Final    Comment: CRITICAL RESULT CALLED TO, READ BACK BY AND VERIFIED WITH: PHARMD N.GOOGOVAC AT 1401 ON 12/29/2022 BY T.SAAD.    Staphylococcus aureus (BCID) NOT DETECTED NOT DETECTED Final   Staphylococcus epidermidis DETECTED (A) NOT DETECTED Final    Comment: CRITICAL RESULT CALLED TO, READ BACK BY AND VERIFIED WITH: PHARMD N.GOOGOVAC AT 1401 ON 12/29/2022 BY T.SAAD.    Staphylococcus lugdunensis NOT DETECTED NOT DETECTED Final   Streptococcus species NOT DETECTED NOT DETECTED Final   Streptococcus agalactiae NOT DETECTED NOT DETECTED Final   Streptococcus pneumoniae NOT  DETECTED NOT DETECTED Final   Streptococcus pyogenes NOT DETECTED NOT DETECTED Final   A.calcoaceticus-baumannii NOT DETECTED NOT DETECTED Final   Bacteroides fragilis NOT DETECTED NOT DETECTED Final   Enterobacterales NOT DETECTED NOT DETECTED Final   Enterobacter cloacae complex NOT DETECTED NOT DETECTED Final   Escherichia coli NOT DETECTED NOT DETECTED Final   Klebsiella aerogenes NOT DETECTED NOT DETECTED Final   Klebsiella oxytoca NOT DETECTED NOT DETECTED Final   Klebsiella pneumoniae NOT DETECTED NOT DETECTED Final   Proteus species NOT DETECTED NOT DETECTED Final   Salmonella species NOT DETECTED NOT DETECTED Final   Serratia marcescens NOT DETECTED NOT DETECTED Final   Haemophilus influenzae NOT DETECTED NOT DETECTED Final   Neisseria meningitidis NOT DETECTED NOT DETECTED Final   Pseudomonas aeruginosa NOT DETECTED NOT DETECTED Final   Stenotrophomonas maltophilia NOT DETECTED NOT DETECTED Final   Candida albicans NOT DETECTED NOT DETECTED Final   Candida auris NOT DETECTED NOT DETECTED Final   Candida glabrata NOT DETECTED NOT DETECTED Final   Candida krusei NOT DETECTED NOT DETECTED Final   Candida parapsilosis NOT DETECTED NOT DETECTED Final   Candida tropicalis NOT DETECTED NOT DETECTED Final   Cryptococcus neoformans/gattii NOT DETECTED NOT DETECTED Final   Methicillin resistance mecA/C NOT DETECTED NOT DETECTED Final    Comment: Performed at [facility], [address] [place], [place] [address]  Culture, blood (Routine X 2) w Reflex to ID Panel     Status: None (Preliminary result)   Collection Time: 01/04/23 11:29 AM   Specimen: BLOOD RIGHT HAND  Result Value Ref Range Status   Specimen Description   Final    BLOOD RIGHT HAND Performed at [facility], [address], [place], [place] [address]    Special Requests   Final    BOTTLES DRAWN AEROBIC AND ANAEROBIC Blood Culture adequate volume Performed at [facility], [address] [place], [place] [address]    Culture   Final    NO GROWTH 2 DAYS Performed at [facility], [address], [place], [place] [address]    Report Status PENDING  Incomplete    Radiology Studies: No results found.  Scheduled Meds:  allopurinol  100 mg Oral Daily   Chlorhexidine Gluconate Cloth  6 each Topical Daily   colchicine  0.6 mg Oral Daily   gabapentin  300 mg Oral QID   Gerhardt's butt cream   Topical BID   guaiFENesin  1,200 mg Oral BID   heparin injection (subcutaneous)  5,000 Units Subcutaneous Q8H  hydrocortisone  1 Application Rectal BID   insulin aspart  0-20 Units Subcutaneous Q4H   lidocaine  1 patch Transdermal Daily   loperamide  2 mg Oral BID   nystatin   Topical BID   pantoprazole (PROTONIX) IV  40 mg Intravenous Q12H   Ensure Max Protein  11 oz Oral Daily   Continuous Infusions:  sodium chloride Stopped (12/29/22 0059)   sodium chloride Stopped (01/03/23 1522)   ceFEPime (MAXIPIME) IV 2 g (01/06/23 1238)   methocarbamol (ROBAXIN) IV Stopped (01/02/23 0306)   metronidazole 500 mg (01/06/23 1005)   ondansetron (ZOFRAN) IV      LOS: 15 days   Marguerita Merles, DO [facility] Available via Epic secure chat 7am-7pm After these hours, please refer to coverage provider listed on [email or web address] 01/06/2023, 1:41 PM

## 2023-01-06 NOTE — Progress Notes (Signed)
   01/06/23 2342  BiPAP/CPAP/SIPAP  Reason BIPAP/CPAP not in use Non-compliant

## 2023-01-07 DIAGNOSIS — K8010 Calculus of gallbladder with chronic cholecystitis without obstruction: Secondary | ICD-10-CM | POA: Diagnosis not present

## 2023-01-07 DIAGNOSIS — Z7401 Bed confinement status: Secondary | ICD-10-CM | POA: Diagnosis not present

## 2023-01-07 DIAGNOSIS — J9601 Acute respiratory failure with hypoxia: Secondary | ICD-10-CM | POA: Diagnosis not present

## 2023-01-07 DIAGNOSIS — N179 Acute kidney failure, unspecified: Secondary | ICD-10-CM | POA: Diagnosis not present

## 2023-01-07 DIAGNOSIS — R188 Other ascites: Secondary | ICD-10-CM

## 2023-01-07 LAB — COMPREHENSIVE METABOLIC PANEL
ALT: 14 U/L (ref 0–44)
AST: 14 U/L — ABNORMAL LOW (ref 15–41)
Albumin: 2 g/dL — ABNORMAL LOW (ref 3.5–5.0)
Alkaline Phosphatase: 69 U/L (ref 38–126)
Anion gap: 10 (ref 5–15)
BUN: 8 mg/dL (ref 6–20)
CO2: 26 mmol/L (ref 22–32)
Calcium: 7.5 mg/dL — ABNORMAL LOW (ref 8.9–10.3)
Chloride: 102 mmol/L (ref 98–111)
Creatinine, Ser: 0.44 mg/dL — ABNORMAL LOW (ref 0.61–1.24)
GFR, Estimated: >60 mL/min (ref >60)
Glucose, Bld: 115 mg/dL — ABNORMAL HIGH (ref 70–99)
Potassium: 4.2 mmol/L (ref 3.5–5.1)
Sodium: 138 mmol/L (ref 135–145)
Total Bilirubin: 0.5 mg/dL (ref 0.3–1.2)
Total Protein: 5.5 g/dL — ABNORMAL LOW (ref 6.5–8.1)

## 2023-01-07 LAB — GLUCOSE, CAPILLARY
Glucose-Capillary: 105 mg/dL — ABNORMAL HIGH (ref 70–99)
Glucose-Capillary: 107 mg/dL — ABNORMAL HIGH (ref 70–99)
Glucose-Capillary: 110 mg/dL — ABNORMAL HIGH (ref 70–99)
Glucose-Capillary: 131 mg/dL — ABNORMAL HIGH (ref 70–99)
Glucose-Capillary: 93 mg/dL (ref 70–99)
Glucose-Capillary: 96 mg/dL (ref 70–99)

## 2023-01-07 LAB — CBC WITH DIFFERENTIAL/PLATELET
Abs Immature Granulocytes: 0.73 10*3/uL — ABNORMAL HIGH (ref 0.00–0.07)
Basophils Absolute: 0 10*3/uL (ref 0.0–0.1)
Basophils Relative: 0 %
Eosinophils Absolute: 0.2 10*3/uL (ref 0.0–0.5)
Eosinophils Relative: 1 %
HCT: 35.3 % — ABNORMAL LOW (ref 39.0–52.0)
Hemoglobin: 10.8 g/dL — ABNORMAL LOW (ref 13.0–17.0)
Immature Granulocytes: 5 %
Lymphocytes Relative: 10 %
Lymphs Abs: 1.7 10*3/uL (ref 0.7–4.0)
MCH: 27.8 pg (ref 26.0–34.0)
MCHC: 30.6 g/dL (ref 30.0–36.0)
MCV: 91 fL (ref 80.0–100.0)
Monocytes Absolute: 1.5 10*3/uL — ABNORMAL HIGH (ref 0.1–1.0)
Monocytes Relative: 9 %
Neutro Abs: 12.2 10*3/uL — ABNORMAL HIGH (ref 1.7–7.7)
Neutrophils Relative %: 75 %
Platelets: 269 10*3/uL (ref 150–400)
RBC: 3.88 MIL/uL — ABNORMAL LOW (ref 4.22–5.81)
RDW: 14.1 % (ref 11.5–15.5)
WBC: 16.4 10*3/uL — ABNORMAL HIGH (ref 4.0–10.5)
nRBC: 0 % (ref 0.0–0.2)

## 2023-01-07 LAB — PHOSPHORUS: Phosphorus: 3.6 mg/dL (ref 2.5–4.6)

## 2023-01-07 LAB — GASTROINTESTINAL PANEL BY PCR, STOOL (REPLACES STOOL CULTURE)

## 2023-01-07 LAB — MAGNESIUM: Magnesium: 1.5 mg/dL — ABNORMAL LOW (ref 1.7–2.4)

## 2023-01-07 MED ORDER — MAGNESIUM SULFATE 4 GM/100ML IV SOLN
4.0000 g | Freq: Once | INTRAVENOUS | Status: AC
  Administered 2023-01-07: 4 g via INTRAVENOUS
  Filled 2023-01-07: qty 100

## 2023-01-07 NOTE — Plan of Care (Signed)
Patient NPO after midnight for IR procedure tomorrow.  Continues to have persistent abdominal pain, exacerbated by turns/movement.  Continues to have Type 7 stools.  No acute changes in condition.

## 2023-01-07 NOTE — Consult Note (Addendum)
Chief Complaint: Patient was seen in consultation today for intra-abdominal fluid collection.  Referring Physician(s): Abigail Miyamoto, MD  Supervising Physician: Marliss Coots  Patient Status: [facility] - In-pt  History of Present Illness: [name] is a 37 y.o. male with a past medical history significant for obesity (BMI 81), gout, bleeding gastric ulcer, erosive esophagitis, NASH cirrhosis, hepatomegaly, calculus with chronic cholecystitis s/p subtotal cholecystectomy 12/21/22 seen today for intra-abdominal fluid collection. [name] underwent elective laparoscopic partial cholecystectomy with Dr. Sheliah Hatch 12/21/22 where he was noted to have a large and fatty liver which prevented adequate retraction of the gallbladder and it was felt that the cystic triangle could not be safely dissected out, leading to decision to proceed with partial cholecystectomy with surgical drain left in place. He was admitted for observation and has had a prolonged hospital course including need for vasopressors, arterial line placement, central line placement and BiPAP. He has had persistent leukocytosis with intermittent fevers which lead to blood cultures being drawn and repeat CT abd/pelvis w/contrast being performed 01/06/23 which showed:  Image degraded exam due to beam hardening artifact secondary to large habitus.   New 9.7 cm fluid collection in gastrohepatic ligament, which may be due to biloma or abscess. Consider nuclear medicine hepatobiliary scan to evaluate for bile leak.   Small amount of free fluid in right paracolic gutter and left lower quadrant.   Tiny bilateral pleural effusions and mild bilateral lower lobe atelectasis.  Blood cultures from 9/19 have been negative thus far, WBC continues to be ~17 despite initiation of cefepime + flagyl.   IR has been consulted for aspiration/possible drain placement within the intra-abdominal fluid collection in the gastrohepatic  ligament.  Patient seen at bedside this afternoon, he is aware of the drain placement request and is agreeable to proceed however expresses great frustration at being in the hospital this long and is hopeful that he can go home shortly after the IR drain is placed. We reviewed that discharge would be dependent on his clinical course and a decision ultimately made by his primary team which he understands. He asks repeatedly if he can have something to drink tomorrow while NPO, he says he is not concerned about eating but "can't live without drinking something all day." I let him know that he may have a few sips of water or ice chip throughout the day tomorrow which he is satisfied with. He asked that I call his sister Herbert Seta to update her on his current care plan which I have done, she is also in agreement with drain.  Past Medical History:  Diagnosis Date   Acute blood loss anemia 09/02/2012   S/p 1 unit rbcs   Cellulitis    Erosive esophagitis 09/01/2012   NSAID induced.   Gall stones    Gastric ulcer with hemorrhage 09/02/2012   s/p bleeding control tx. Per Dr. Jena Gauss   Gout    Obesity    UTI (lower urinary tract infection)     Past Surgical History:  Procedure Laterality Date   CHOLECYSTECTOMY N/A 12/21/2022   Procedure: LAPAROSCOPIC CHOLECYSTECTOMY;  Surgeon: Kinsinger, De Blanch, MD;  Location: [facility];  Service: General;  Laterality: N/A;   COLONOSCOPY WITH PROPOFOL N/A 07/30/2014   RMR: Colonoscopy anal canal/internal hemorrhoids, otherwise normal ileocolonoscoopy.    ESOPHAGOGASTRODUODENOSCOPY Left 09/01/2012   [id number] esophageal erosions consistent with erosive reflux esophagitis/Pre-pyloric benign-appearing gastric ulcer with bleeding stigmata status post bleeding control therapy as described above   ESOPHAGOGASTRODUODENOSCOPY N/A 12/13/2012  ZOX:[id number] hernia. PReviously noted gastric ulcer completely healed.    ESOPHAGOGASTRODUODENOSCOPY (EGD) WITH PROPOFOL N/A 07/30/2014    Procedure: ESOPHAGOGASTRODUODENOSCOPY (EGD) WITH PROPOFOL;  Surgeon: Corbin Ade, MD;  Location: [facility];  Service: Endoscopy;  Laterality: N/A;   TONSILLECTOMY      Allergies: Asa [aspirin], Clindamycin/lincomycin, Nsaids, Penicillins, and Sulfa antibiotics  Medications: Prior to Admission medications   Medication Sig Start Date End Date Taking? Authorizing Provider  acetaminophen (TYLENOL) 325 MG tablet Take 2 tablets (650 mg total) by mouth every 6 (six) hours as needed for mild pain. 07/01/22  Yes Tyrone Nine, MD  allopurinol (ZYLOPRIM) 100 MG tablet Take 1 tablet (100 mg total) by mouth daily. 07/02/22  Yes Tyrone Nine, MD  gabapentin (NEURONTIN) 300 MG capsule Take 1 capsule (300 mg total) by mouth 4 (four) times daily. 07/01/22  Yes Tyrone Nine, MD  hydrocortisone (ANUSOL-HC) 2.5 % rectal cream Place 1 Application rectally 2 (two) times daily.   Yes [provider]  Lidocaine-Menthol 4-5 % PTCH Apply 1 patch topically daily. Remove after 12 hours   Yes [provider]  ondansetron (ZOFRAN-ODT) 4 MG disintegrating tablet Take 1 tablet (4 mg total) by mouth every 8 (eight) hours as needed for nausea. 08/12/22  Yes Eber Hong, MD  pantoprazole (PROTONIX) 40 MG tablet Take 1 tablet (40 mg total) by mouth 2 (two) times daily before a meal. 07/01/22  Yes Tyrone Nine, MD  traMADol (ULTRAM) 50 MG tablet Take 50 mg by mouth 3 (three) times daily as needed for moderate pain.   Yes [provider]     Family History  Problem Relation Age of Onset   Heart failure Mother    Colon cancer Neg Hx     Social History   Socioeconomic History   Marital status: Single    Spouse name: Not on file   Number of children: Not on file   Years of education: Not on file   Highest education level: Not on file  Occupational History   Not on file  Tobacco Use   Smoking status: Heavy Smoker    Current packs/day: 1.00    Average packs/day: 1 pack/day for 7.0 years (7.0  ttl pk-yrs)    Types: Cigarettes   Smokeless tobacco: Current    Types: Snuff   Tobacco comments:    daily in the evenings\  Substance and Sexual Activity   Alcohol use: No    Alcohol/week: 0.0 standard drinks of alcohol   Drug use: Yes    Types: Marijuana    Comment: if in pain   Sexual activity: Never  Other Topics Concern   Not on file  Social History Narrative   Not on file   Social Determinants of Health   Financial Resource Strain: Not on file  Food Insecurity: Patient Declined (12/23/2022)   Hunger Vital Sign    Worried About Running Out of Food in the Last Year: Patient declined    Ran Out of Food in the Last Year: Patient declined  Transportation Needs: No Transportation Needs (08/17/2022)   PRAPARE - Administrator, Civil Service (Medical): No    Lack of Transportation (Non-Medical): No  Physical Activity: Not on file  Stress: Not on file  Social Connections: Not on file     Review of Systems: A 12 point ROS discussed and pertinent positives are indicated in the HPI above.  All other systems are negative.  Review of Systems  Constitutional:  Positive for fatigue. Negative for chills and fever.  Respiratory:  Negative for cough and shortness of breath.   Cardiovascular:  Negative for chest pain.  Gastrointestinal:  Positive for abdominal pain and diarrhea. Negative for nausea and vomiting.  Musculoskeletal:  Positive for arthralgias, back pain and myalgias.  Neurological:  Negative for dizziness and headaches.    Vital Signs: BP 129/72 (BP Location: Right Arm)   Pulse (!) 104   Temp (!) 97.3 F (36.3 C) (Oral)   Resp 20   Ht 5\' 7"  (1.702 m)   Wt (!) 518 lb 1.3 oz (235 kg)   SpO2 92%   BMI 81.14 kg/m   Physical Exam Vitals and nursing note reviewed.  Constitutional:      General: He is not in acute distress.    Appearance: He is obese. He is not ill-appearing or diaphoretic.  HENT:     Head: Normocephalic.     Mouth/Throat:     Mouth:  Mucous membranes are moist.     Pharynx: Oropharynx is clear. No oropharyngeal exudate or posterior oropharyngeal erythema.  Eyes:     General: No scleral icterus. Cardiovascular:     Rate and Rhythm: Regular rhythm. Tachycardia present.  Pulmonary:     Effort: Pulmonary effort is normal.     Comments: Diminished breath sounds bilaterally Abdominal:     General: There is no distension.     Palpations: Abdomen is soft.     Tenderness: There is abdominal tenderness (RUQ/epigastric, mild).     Comments: (+) RUQ surgical drain in place draining clear, bilious appearing fluid  Skin:    General: Skin is warm and dry.     Coloration: Skin is not jaundiced.  Neurological:     Mental Status: He is alert and oriented to person, place, and time.  Psychiatric:        Mood and Affect: Mood normal.        Behavior: Behavior normal.        Thought Content: Thought content normal.        Judgment: Judgment normal.      MD Evaluation Airway: WNL Heart: WNL Abdomen: WNL Chest/ Lungs: WNL ASA  Classification: 3 Mallampati/Airway Score: Two   Imaging: CT ABDOMEN PELVIS W CONTRAST  Result Date: 01/06/2023 CLINICAL DATA:  Abdominal pain. Two weeks postop from laparoscopic cholecystectomy. EXAM: CT ABDOMEN AND PELVIS WITH CONTRAST TECHNIQUE: Multidetector CT imaging of the abdomen and pelvis was performed using the standard protocol following bolus administration of intravenous contrast. RADIATION DOSE REDUCTION: This exam was performed according to the departmental dose-optimization program which includes automated exposure control, adjustment of the mA and/or kV according to patient size and/or use of iterative reconstruction technique. CONTRAST:  OMNIPAQUE IOHEXOL 300 MG/ML  SOLN COMPARISON:  12/28/2022 FINDINGS: Significant image degradation noted due to beam attenuation artifact from large body habitus. Lower Chest: Tiny bilateral pleural effusions and mild bilateral lower lobe  atelectasis. Hepatobiliary: Mild diffuse hepatic steatosis again noted. No suspicious hepatic masses identified. Prior cholecystectomy again noted. No evidence of biliary ductal dilatation. Right upper quadrant surgical drain remains in place. A new fluid collection is seen within gastrohepatic ligament which measures 9.7 x 7.0 cm. Small amount of free fluid seen along inferior margin of right hepatic lobe and in left lower quadrant. Pancreas:  No mass or inflammatory changes. Spleen: Within normal limits in size and appearance. Adrenals/Urinary Tract: Stable 3.5 cm low-attenuation left adrenal mass, consistent with benign adenoma (No followup imaging is  recommended). No suspicious renal masses identified. No evidence of ureteral calculi or hydronephrosis. Foley catheter seen within the bladder. Stomach/Bowel: No evidence of bowel obstruction or other acute findings Vascular/Lymphatic: No pathologically enlarged lymph nodes. No acute vascular findings. Reproductive:  No mass or other significant abnormality. Other:  None. Musculoskeletal:  No suspicious bone lesions identified. IMPRESSION: Image degraded exam due to beam hardening artifact secondary to large habitus. New 9.7 cm fluid collection in gastrohepatic ligament, which may be due to biloma or abscess. Consider nuclear medicine hepatobiliary scan to evaluate for bile leak. Small amount of free fluid in right paracolic gutter and left lower quadrant. Tiny bilateral pleural effusions and mild bilateral lower lobe atelectasis. Electronically Signed   By: Danae Orleans M.D.   On: 01/06/2023 15:39   DG CHEST PORT 1 VIEW  Result Date: 01/04/2023 CLINICAL DATA:  Leukocytosis EXAM: PORTABLE CHEST 1 VIEW COMPARISON:  12/29/2022 FINDINGS: Previously seen central venous catheter has been removed. Stable cardiomegaly with pulmonary vascular congestion. Increasing streaky bibasilar opacities. No pleural effusion or pneumothorax. IMPRESSION: 1. Increasing streaky  bibasilar opacities, which may represent atelectasis or infection. 2. Cardiomegaly with pulmonary vascular congestion. Electronically Signed   By: Duanne Guess D.O.   On: 01/04/2023 14:17   DG CHEST PORT 1 VIEW  Result Date: 12/29/2022 CLINICAL DATA:  Central line placement EXAM: PORTABLE CHEST 1 VIEW COMPARISON:  Chest x-ray 12/28/2022 FINDINGS: There is a new left-sided central venous catheter with distal tip in the mid SVC. The heart is enlarged, unchanged. Central pulmonary vascular congestion persists. There are minimal patchy opacities in the lung bases, left greater than right. No pleural effusion or pneumothorax. Osseous structures are stable. IMPRESSION: 1. New left-sided central venous catheter with distal tip in the mid SVC. No pneumothorax. 2. Stable cardiomegaly with central pulmonary vascular congestion. Electronically Signed   By: Darliss Cheney M.D.   On: 12/29/2022 01:23   [place] EKG SITE RITE  Result Date: 12/28/2022 If Site Rite image not attached, placement could not be confirmed due to current cardiac rhythm.  DG Chest Port 1 View  Result Date: 12/28/2022 CLINICAL DATA:  Tachypnea, tachycardia EXAM: PORTABLE CHEST 1 VIEW COMPARISON:  12:03 p.m. FINDINGS: Lung volumes are small, but are symmetric and are stable since prior examination. Progressive perihilar pulmonary infiltrate is present in keeping with perihilar pulmonary edema. Stable cardiomegaly. No pneumothorax or pleural effusion. IMPRESSION: 1. Progressive perihilar pulmonary edema. Electronically Signed   By: Helyn Numbers M.D.   On: 12/28/2022 22:02   CT CHEST ABDOMEN PELVIS W CONTRAST  Result Date: 12/28/2022 CLINICAL DATA:  Postoperative abdominal pain. EXAM: CT CHEST, ABDOMEN, AND PELVIS WITH CONTRAST TECHNIQUE: Multidetector CT imaging of the chest, abdomen and pelvis was performed following the standard protocol during bolus administration of intravenous contrast. Very limited exam due to body habitus. RADIATION  DOSE REDUCTION: This exam was performed according to the departmental dose-optimization program which includes automated exposure control, adjustment of the mA and/or kV according to patient size and/or use of iterative reconstruction technique. CONTRAST:  OMNIPAQUE IOHEXOL 300 MG/ML  SOLN COMPARISON:  August 12, 2022.  October 11, 2015. FINDINGS: CT CHEST FINDINGS Cardiovascular: No significant vascular findings. Normal heart size. No pericardial effusion. Mediastinum/Nodes: No enlarged mediastinal, hilar, or axillary lymph nodes. Thyroid gland, trachea, and esophagus demonstrate no significant findings. Lungs/Pleura: No pneumothorax or pleural effusion is noted. Right lung is clear. Minimal left basilar subsegmental atelectasis is noted. Musculoskeletal: No chest wall mass or suspicious bone lesions identified. CT  ABDOMEN PELVIS FINDINGS Hepatobiliary: Status post cholecystectomy. Surgical drain is seen with tip in gallbladder fossa. No definite biliary dilatation is noted. Visualization of hepatic parenchyma is limited due to body habitus, but no definite abnormality is noted. Pancreas: Not well visualized. Spleen: Normal in size without focal abnormality. Adrenals/Urinary Tract: Right adrenal gland appears normal. 3.6 cm left adrenal nodule is noted most consistent with adenoma. No hydronephrosis or renal obstruction is noted. Urinary bladder is decompressed. Stomach/Bowel: Stomach is unremarkable. There is no definite evidence of bowel obstruction or inflammation. Appendix is not visualized. Vascular/Lymphatic: No significant vascular findings are present. No enlarged abdominal or pelvic lymph nodes. Reproductive: Prostate is unremarkable. Other: No definite ascites or hernia is noted. Musculoskeletal: No definite fracture or other acute osseous abnormality is noted. IMPRESSION: Significantly limited exam due to soft tissue attenuation artifact secondary to body habitus. Status post cholecystectomy, with  surgical drain tip seen in gallbladder fossa. Minimal left basilar subsegmental atelectasis. 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 Electronically Signed   By: Lupita Raider M.D.   On: 12/28/2022 14:46   DG Chest Port 1 View  Result Date: 12/28/2022 CLINICAL DATA:  Sepsis. EXAM: PORTABLE CHEST 1 VIEW COMPARISON:  December 22, 2022. FINDINGS: Stable cardiomegaly with central pulmonary vascular congestion. Minimal bibasilar subsegmental atelectasis is noted. Bony thorax is unremarkable. IMPRESSION: Stable cardiomegaly with central pulmonary vascular congestion. Minimal bibasilar subsegmental atelectasis. Electronically Signed   By: Lupita Raider M.D.   On: 12/28/2022 13:42   DG CHEST PORT 1 VIEW  Result Date: 12/22/2022 CLINICAL DATA:  Cough. EXAM: PORTABLE CHEST 1 VIEW COMPARISON:  Chest radiograph dated 12/29/2017. FINDINGS: Shallow inspiration. There is cardiomegaly with vascular congestion. Bilateral linear atelectasis. No focal consolidation, pleural effusion, or pneumothorax. No acute osseous pathology. IMPRESSION: Cardiomegaly with vascular congestion. No focal consolidation. Electronically Signed   By: Elgie Collard M.D.   On: 12/22/2022 15:51    Labs:  CBC: Recent Labs    01/04/23 0445 01/05/23 0419 01/06/23 0720 01/07/23 0742  WBC 17.7* 17.5* 15.8* 16.4*  HGB 11.4* 11.4* 11.0* 10.8*  HCT 36.4* 35.8* 35.9* 35.3*  PLT 205 220 240 269    COAGS: Recent Labs    12/28/22 1147 12/30/22 0707  INR 1.2 1.1  APTT 34  --     BMP: Recent Labs    01/04/23 0445 01/05/23 0419 01/06/23 0720 01/07/23 0511  NA 136 134* 137 138  K 3.2* 3.7 3.6 4.2  CL 100 97* 100 102  CO2 27 29 28 26   GLUCOSE 117* 113* 113* 115*  BUN 22* 16 11 8   CALCIUM 7.6* 7.4* 7.7* 7.5*  CREATININE 0.45* 0.55* 0.38* 0.44*  GFRNONAA >60 >60 >60 >60    LIVER FUNCTION TESTS: Recent Labs    01/04/23 0445 01/05/23 0419 01/06/23 0720 01/07/23 0511  BILITOT 0.8 1.0 0.7 0.5  AST 12* 18 11* 14*  ALT  16 13 11 14   ALKPHOS 68 73 64 69  PROT 5.6* 5.5* 5.3* 5.5*  ALBUMIN 2.1* 2.1* 2.1* 2.0*    TUMOR MARKERS: No results for input(s): "AFPTM", "CEA", "CA199", "CHROMGRNA" in the last 8760 hours.  Assessment and Plan:  37 y/o M with history of laparoscopic subtotal cholecystectomy 12/21/22 for chronic calculus cholecystitis with subsequent development of intra-abdominal fluid collection in the gastrohepatic ligament concerning for biloma vs abscess. IR has been consulted for aspiration/possible drain placement.  Patient history and imaging reviewed by Dr. Elby Showers who approves procedure, tentatively for Monday 9/23 pending any emergent procedures.  Plan: - NPO at midnight, as discussed with patient may have small sips of water ( < 1 oz) or few ice chips periodically tomorrow. No solids or other liquids besides water. He is aware that not adhering to these parameters will delay his procedure. - CBC/INR AM of 9/23 - Currently receiving cefepime Q8H + Flagyl Q12H, will hold on additional antibiotics for drain placement - 0600 heparin SQ dose held on 9/23 in anticipation of procedure - IR will call for patient when ready. The patient and his sister are aware that the procedure could be delayed until Tuesday if there are emergent IR procedures which would take precedence.  Risks and benefits discussed with the patient including bleeding, infection, damage to adjacent structures, bowel perforation/fistula connection, and sepsis.  All of the patient's questions were answered, patient is agreeable to proceed.  Consent signed and in chart.  Thank you for this interesting consult.  I greatly enjoyed meeting TANVIR CIRRINCIONE and look forward to participating in their care.  A copy of this report was sent to the requesting provider on this date.  Electronically Signed: Villa Herb, PA-C 01/07/2023, 1:09 PM   I spent a total of 55 Mi[place]  in face to face in clinical consultation, greater than  50% of which was counseling/coordinating care for intra-abdominal fluid collection.

## 2023-01-07 NOTE — Progress Notes (Signed)
17 Days Post-Op   Subjective/Chief Complaint: No acute changes over night   Objective: Vital signs in last 24 hours: Temp:  [97.3 F (36.3 C)-98.8 F (37.1 C)] 97.3 F (36.3 C) (09/22 0446) Pulse Rate:  [88-104] 104 (09/22 0446) Resp:  [20] 20 (09/22 0446) BP: (111-129)/(57-72) 129/72 (09/22 0446) SpO2:  [92 %-96 %] 92 % (09/22 0446) Last BM Date : 01/06/23  Intake/Output from previous day: 09/21 0701 - 09/22 0700 In: 1381.2 [P.O.:482; IV Piggyback:899.2] Out: 1740 [Urine:1650; Drains:90] Intake/Output this shift: No intake/output data recorded.  Exam: Awake and alert Abdomen very obese, mildly tender Drain consist with light colored bile  Lab Results:  Recent Labs    01/05/23 0419 01/06/23 0720  WBC 17.5* 15.8*  HGB 11.4* 11.0*  HCT 35.8* 35.9*  PLT 220 240   BMET Recent Labs    01/06/23 0720 01/07/23 0511  NA 137 138  K 3.6 4.2  CL 100 102  CO2 28 26  GLUCOSE 113* 115*  BUN 11 8  CREATININE 0.38* 0.44*  CALCIUM 7.7* 7.5*   PT/INR No results for input(s): "LABPROT", "INR" in the last 72 hours. ABG No results for input(s): "PHART", "HCO3" in the last 72 hours.  Invalid input(s): "PCO2", "PO2"  Studies/Results: CT ABDOMEN PELVIS W CONTRAST  Result Date: 01/06/2023 CLINICAL DATA:  Abdominal pain. Two weeks postop from laparoscopic cholecystectomy. EXAM: CT ABDOMEN AND PELVIS WITH CONTRAST TECHNIQUE: Multidetector CT imaging of the abdomen and pelvis was performed using the standard protocol following bolus administration of intravenous contrast. RADIATION DOSE REDUCTION: This exam was performed according to the departmental dose-optimization program which includes automated exposure control, adjustment of the mA and/or kV according to patient size and/or use of iterative reconstruction technique. CONTRAST:  OMNIPAQUE IOHEXOL 300 MG/ML  SOLN COMPARISON:  12/28/2022 FINDINGS: Significant image degradation noted due to beam attenuation artifact from  large body habitus. Lower Chest: Tiny bilateral pleural effusions and mild bilateral lower lobe atelectasis. Hepatobiliary: Mild diffuse hepatic steatosis again noted. No suspicious hepatic masses identified. Prior cholecystectomy again noted. No evidence of biliary ductal dilatation. Right upper quadrant surgical drain remains in place. A new fluid collection is seen within gastrohepatic ligament which measures 9.7 x 7.0 cm. Small amount of free fluid seen along inferior margin of right hepatic lobe and in left lower quadrant. Pancreas:  No mass or inflammatory changes. Spleen: Within normal limits in size and appearance. 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 No suspicious renal masses identified. No evidence of ureteral calculi or hydronephrosis. Foley catheter seen within the bladder. Stomach/Bowel: No evidence of bowel obstruction or other acute findings Vascular/Lymphatic: No pathologically enlarged lymph nodes. No acute vascular findings. Reproductive:  No mass or other significant abnormality. Other:  None. Musculoskeletal:  No suspicious bone lesions identified. IMPRESSION: Image degraded exam due to beam hardening artifact secondary to large habitus. New 9.7 cm fluid collection in gastrohepatic ligament, which may be due to biloma or abscess. Consider nuclear medicine hepatobiliary scan to evaluate for bile leak. Small amount of free fluid in right paracolic gutter and left lower quadrant. Tiny bilateral pleural effusions and mild bilateral lower lobe atelectasis. Electronically Signed   By: Danae Orleans M.D.   On: 01/06/2023 15:39    Anti-infectives: Anti-infectives (From admission, onward)    Start     Dose/Rate Route Frequency Ordered Stop   01/01/23 2000  ceFEPIme (MAXIPIME) 2 g in sodium chloride 0.9 %  100 mL IVPB        2 g 200 mL/hr over 30 Minutes Intravenous Every 8 hours 01/01/23 1311      12/31/22 1245  linezolid (ZYVOX) IVPB 600 mg  Status:  Discontinued        600 mg 300 mL/hr over 60 Minutes Intravenous 2 times daily 12/31/22 1145 01/02/23 1146   12/29/22 2200  ceFEPIme (MAXIPIME) 2 g in sodium chloride 0.9 % 100 mL IVPB  Status:  Discontinued        2 g 200 mL/hr over 30 Minutes Intravenous Every 12 hours 12/28/22 2127 12/29/22 1401   12/29/22 1500  ceFEPIme (MAXIPIME) 2 g in sodium chloride 0.9 % 100 mL IVPB  Status:  Discontinued        2 g 200 mL/hr over 30 Minutes Intravenous 2 times daily 12/29/22 1401 01/01/23 1311   12/29/22 1215  linezolid (ZYVOX) IVPB 600 mg  Status:  Discontinued        600 mg 300 mL/hr over 60 Minutes Intravenous Every 12 hours 12/29/22 1120 12/31/22 1145   12/29/22 0400  vancomycin (VANCOREADY) IVPB 1250 mg/250 mL  Status:  Discontinued        1,250 mg 166.7 mL/hr over 90 Minutes Intravenous Every 12 hours 12/28/22 1512 12/28/22 2127   12/28/22 2126  vancomycin variable dose per unstable renal function (pharmacist dosing)  Status:  Discontinued         Does not apply See admin instructions 12/28/22 2127 12/29/22 1120   12/28/22 2100  ceFEPIme (MAXIPIME) 2 g in sodium chloride 0.9 % 100 mL IVPB  Status:  Discontinued        2 g 200 mL/hr over 30 Minutes Intravenous Every 8 hours 12/28/22 1512 12/28/22 2127   12/28/22 2100  metroNIDAZOLE (FLAGYL) IVPB 500 mg        500 mg 100 mL/hr over 60 Minutes Intravenous Every 12 hours 12/28/22 1512     12/28/22 1600  vancomycin (VANCOREADY) IVPB 2000 mg/400 mL        2,000 mg 200 mL/hr over 120 Minutes Intravenous  Once 12/28/22 1512 12/28/22 1752   12/28/22 1230  piperacillin-tazobactam (ZOSYN) IVPB 3.375 g  Status:  Discontinued        3.375 g 12.5 mL/hr over 240 Minutes Intravenous Every 8 hours 12/28/22 1116 12/28/22 1503   12/21/22 0707  ceFAZolin (ANCEF) 3-0.9 GM/100ML-% IVPB       Note to Pharmacy: [facility]: cabinet override      12/21/22 0707 12/21/22 1914   12/21/22 0600   ceFAZolin (ANCEF) IVPB 2g/100 mL premix  Status:  Discontinued        2 g 200 mL/hr over 30 Minutes Intravenous On call to O.R. 12/21/22 0541 12/21/22 1058       Assessment/Plan: S/p partial cholecystectomy, bile leak, morbid obesity  I reviewed the CT scan which shows a new 9.7 cm fluid collection in the gastrohepatic ligament.  The current drain placed in the OR does not reach this area. Will ask IR to evaluate for another possible drain.  Given suspected bile leak given other drain output and partial cholecystectomy, I do not think the pt needs a HIDA scan   Abigail Miyamoto MD 01/07/2023

## 2023-01-07 NOTE — TOC Progression Note (Signed)
Transition of Care [facility]) - Progression Note    Patient Details  Name: [name] MRN: [id number] Date of Birth: August 08, 1985  Transition of Care [facility] CM/SW Contact  Darleene Cleaver, [place] Phone Number: 01/07/2023, 7:37 PM  Clinical Narrative:     Patient LTC at [facility] plan to return once medically ready for discharge.  Expected Discharge Plan: Long Term Nursing Home Barriers to Discharge: Continued Medical Work up  Expected Discharge Plan and Services In-house Referral: Clinical Social Work   Post Acute Care Choice: Nursing Home Living arrangements for the past 2 months: Skilled Nursing Facility                                       Social Determinants of Health (SDOH) Interventions SDOH Screenings   Food Insecurity: Patient Declined (12/23/2022)  Housing: Patient Declined (08/17/2022)  Transportation Needs: No Transportation Needs (08/17/2022)  Utilities: Not At Risk (08/17/2022)  Tobacco Use: High Risk (12/21/2022)    Readmission Risk Interventions    12/26/2022    4:01 PM  Readmission Risk Prevention Plan  Post Dischage Appt Complete  Medication Screening Complete  Transportation Screening Complete

## 2023-01-07 NOTE — Plan of Care (Signed)
  Problem: Skin Integrity: Goal: Risk for impaired skin integrity will decrease Outcome: Progressing   Problem: Metabolic: Goal: Ability to maintain appropriate glucose levels will improve Outcome: Progressing   Problem: Safety: Goal: Ability to remain free from injury will improve Outcome: Progressing   Problem: Coping: Goal: Level of anxiety will decrease Outcome: Progressing   Problem: Pain Managment: Goal: General experience of comfort will improve Outcome: Progressing

## 2023-01-07 NOTE — Progress Notes (Signed)
PROGRESS NOTE    [name]  EXB:[id number] DOB: [birth year] DOA: 12/21/2022 PCP: Toma Deiters, MD   Brief Narrative:  The Patient [name] is a 37 y/o male with obesity, NASH who presented with acute cholecystitis requiring cholecystectomy, which was complicated by his hepatomegaly. He had a bile leak post-op requiring a drain  Significant Hospital Events:    9/5 - Underwent elective lap chole with CCS. Partial cholecystectomy completed in the setting of poor visualization. EBL . Drain left in place. 9/12 - Transferred to SDU for worsening hypoxia, tachypnea, tachycardia. Hypotensive. CT Chest/A/P with postoperative changes, surgical drain in gallbladder fossa, minimal L basilar subsegmental atelectasis, L adrenal adenoma. TRH consulted. Fluids/Zosyn given, broadened to vanc/cefepime/Flagyl. Refused BiPAP/stated he would refuse intubation even if it meant death. Made DNR. Later in shift more hypotensive, PCCM consulted for pressors. Code status changed back to full code. Vasopressors initiated. 9/13 - A-line, LIJ CVC placed. High pressor requirements with NE 38, Neo 70, vaso 0.03. 9/16: neo stopped 9/13. NE and vaso stopped 9/15. Duoneb for wheezing. A-line removed. Transfer to SDU.  9/17: CCM requested TRH to continue to follow for medical management.  Surgery remains primary service.  9/18 -Had some Transient Bradycardia and ? Pause; will replete electrolytes and continue to monitor.  Patient's renal function is improved  9/19 -Spiked a temperature yesterday and so we will obtain blood cultures x 2 and workup with fever given that WBC slightly worsened to 17.7.   9/20 - WBC still about the same. Repeat CT Scan pending. Patient states he did not sleep very well.   9/21 -having quite a bit of diarrhea refuses for his rectal tube to be reinserted.  Appears frustrated and wanting to leave.    01/07/2023-CT scan shows new 9.7 cm fluid collection in interventional radiology  has not been consulted.  Will continue cefepime and Flagyl and intramedullary is recommending for aspiration possible drain placement on 01/08/2023   Assessment and Plan:  Chronic Calculous Cholecystitis, sepsis from biliary source, improving slowly Biliary leak, post operatively.  New 9.7 cm fluid collection in the gastrohepatic ligament -Currently off vasopressors, sepsis physiology improving but still spiking intermittent temp, BP now elevated -WBC Trend: Recent Labs  Lab 01/01/23 0500 01/02/23 0341 01/03/23 0325 01/04/23 0445 01/05/23 0419 01/06/23 0720 01/07/23 0742  WBC 25.0* 20.4* 15.9* 17.7* 17.5* 15.8* 16.4*  -On broad-spectrum antibiotics, management per general surgery -Continues to spike some intermittent temperatures and had a Tmax of 100.6 a few days ago will need to continue monitor carefully and ordered Blood Cx x2; -Repeat CXR done and showed "Increasing streaky bibasilar opacities, which may represent atelectasis or infection. Cardiomegaly with pulmonary vascular congestion." -U/A done and showed a hazy appearance with small hemoglobin, large leukocytes, negative nitrites, rare bacteria, 11-20 RBC per high-power field and 21-50 WBCs -Repeat CT Scan of the Abdomen and Pelvis done and showed "Image degraded exam due to beam hardening artifact secondary to large habitus. New 9.7 cm fluid collection in gastrohepatic ligament, which may be due to biloma or abscess. Consider nuclear medicine hepatobiliary scan to evaluate for bile leak. Small amount of free fluid in right paracolic gutter and left lower quadrant.Tiny bilateral pleural effusions and mild bilateral lower lobe atelectasis."  -Now patient is complaining about significant diarrhea so we will do a GI pathogen panel which was NEGATIVE -Procalcitonin level was 3.33 however on admission it was greater than 150; repeat in the a.m. -Seen by GI this admission, plan and timing for  ERCP per GI -Blood cultures 1/2 staph epi,  currently on Cefepime and Flagyl; blood cultures x 2 being reobtained and repeat blood culture showing no growth to date at 2 days -Biliary drain in place and general surgery feels that he does not need a HIDA scan given his suspected bile leak given other drain output and partial cholecystectomy  -Patient is on Carb Modified Diet now but now general surgery has asked interventional radiology to evaluate for his new fluid collection in concern for abscess he will need a another drain -Plan is for interventional radiology to evaluate and he is to be n.p.o. at midnight for possible drain placement as well as obtaining a CBC and iron in the a.m. and holding his subcu heparin   Acute on chronic respiratory failure with hypoxia -Multifactorial secondary to aggressive fluid resuscitation during shock, OSA, OHS - BiPAP as needed, received IV Lasix 40 mg x 1 on 9/16, 9/17, 9/19 (per PCCM) for fluid overload -Still positive balance of 2.495 L -Holding off giving him a dose of IV Lasix today SpO2: 90 % O2 Flow Rate (L/min): 2 L/min FiO2 (%): 35 % -Continuous Pulse Oximetry and maintain O2 saturation greater than 90% -Continue supplemental oxygen via nasal cannula and wean oxygen to room air -Repeat CXR done 01/04/2023 showed "Increasing streaky bibasilar opacities, which may represent atelectasis or infection. Cardiomegaly with pulmonary vascular congestion." -Initiate Flutter Valve, Incentive Spirometry, Guaifenesin 1200 po BID -Continue to monitor oxygen requirements as necessary   Electrolyte Abnormalities including Hypokalemia and Hypophosphatemia and Hypomagnesemia -K, Mag, and Phos Level Trend: Recent Labs  Lab 01/01/23 0500 01/02/23 0341 01/03/23 0324 01/03/23 0325 01/04/23 0445 01/05/23 0419 01/06/23 0720 01/07/23 0511  K 3.8 3.5  --  3.0* 3.2* 3.7 3.6 4.2  MG  --   --    < >  --  1.5* 1.7 1.7 1.5*  PHOS  --   --    < >  --  1.9* 2.6 2.9 3.6   < > = values in this interval not  displayed.  -Replete with IV Mag Sulfate 2 grams again today -Conitnue To monitor replete as necessary repeat CMP in a.m. along with Phos and mag  OHS/OSA -Largely related to habitus, morbid obesity -BiPAP as above  AKI, improved and resolved  -In the setting of hypotension, sepsis.  Likely ATN and combination of obstructive physiology, unable to pass urine prior to Foley placement.   - Status post Foley placement with a cystoscopy 9/14 by Urology -BUN/Cr Trend: Recent Labs  Lab 01/01/23 0500 01/02/23 0341 01/03/23 0325 01/04/23 0445 01/05/23 0419 01/06/23 0720 01/07/23 0511  BUN 64* 58* 43* 22* 16 11 8   CREATININE 2.39* 1.34* 0.72 0.45* 0.55* 0.38* 0.44*  -Avoid Nephrotoxic Medications, Contrast Dyes, Hypotension and Dehydration to Ensure Adequate Renal Perfusion and will need to Renally Adjust Meds -Follow Renal Fxn Closely with Diuresis and will hold off on giving him another dose of Lasix today -Continue to Monitor and Trend Renal Function carefully and repeat CMP in the AM   Normocytic Anemia -Hgb/Hct Trend: Recent Labs  Lab 01/01/23 0500 01/02/23 0341 01/03/23 0325 01/04/23 0445 01/05/23 0419 01/06/23 0720 01/07/23 0742  HGB 10.3* 10.3* 11.1* 11.4* 11.4* 11.0* 10.8*  HCT 33.9* 33.4* 35.9* 36.4* 35.8* 35.9* 35.3*  MCV 91.9 90.5 90.0 90.8 89.7 92.1 91.0  -Checked Anemia Panel and showed an iron level of 26, UIBC of 166, TIBC 192, saturation ratios of 14%, ferritin level 530, folate level of 8.3, vitamin B12  633 -Continue to monitor for signs and symptoms bleeding; no overt bleeding noted -Repeat CBC in a.m.  Hypoalbuminemia -Patient's Albumin Trend: Recent Labs  Lab 12/31/22 0618 01/01/23 0500 01/02/23 0341 01/04/23 0445 01/05/23 0419 01/06/23 0720 01/07/23 0511  ALBUMIN 2.4* 2.3* 2.3* 2.1* 2.1* 2.1* 2.0*  -Continue to Monitor and Trend and repeat CMP in the AM  Super Morbid Obesity -Complicates overall prognosis and care -Estimated body mass index is  81.14 kg/m as calculated from the following:   Height as of this encounter: 5\' 7"  (1.702 m).   Weight as of this encounter: 235 kg.  -Weight Loss and Dietary Counseling given   DVT prophylaxis: heparin injection 5,000 Units Start: 12/29/22 1430 SCD's Start: 12/21/22 1116    Code Status: Full Code Family Communication: No family currently at bedside  Disposition Plan:  Level of care: Progressive Status is: Inpatient Remains inpatient appropriate because: Needs further clinical improvement and interventional cardiology been called for his new intra-abdominal fluid collection in the gastrohepatic ligament   Consultants:  General Surgery Interventional Radiology PCCM TRH  Procedures:  As delineated as above  Antimicrobials:  Anti-infectives (From admission, onward)    Start     Dose/Rate Route Frequency Ordered Stop   01/01/23 2000  ceFEPIme (MAXIPIME) 2 g in sodium chloride 0.9 % 100 mL IVPB        2 g 200 mL/hr over 30 Minutes Intravenous Every 8 hours 01/01/23 1311     12/31/22 1245  linezolid (ZYVOX) IVPB 600 mg  Status:  Discontinued        600 mg 300 mL/hr over 60 Minutes Intravenous 2 times daily 12/31/22 1145 01/02/23 1146   12/29/22 2200  ceFEPIme (MAXIPIME) 2 g in sodium chloride 0.9 % 100 mL IVPB  Status:  Discontinued        2 g 200 mL/hr over 30 Minutes Intravenous Every 12 hours 12/28/22 2127 12/29/22 1401   12/29/22 1500  ceFEPIme (MAXIPIME) 2 g in sodium chloride 0.9 % 100 mL IVPB  Status:  Discontinued        2 g 200 mL/hr over 30 Minutes Intravenous 2 times daily 12/29/22 1401 01/01/23 1311   12/29/22 1215  linezolid (ZYVOX) IVPB 600 mg  Status:  Discontinued        600 mg 300 mL/hr over 60 Minutes Intravenous Every 12 hours 12/29/22 1120 12/31/22 1145   12/29/22 0400  vancomycin (VANCOREADY) IVPB 1250 mg/250 mL  Status:  Discontinued        1,250 mg 166.7 mL/hr over 90 Minutes Intravenous Every 12 hours 12/28/22 1512 12/28/22 2127   12/28/22 2126   vancomycin variable dose per unstable renal function (pharmacist dosing)  Status:  Discontinued         Does not apply See admin instructions 12/28/22 2127 12/29/22 1120   12/28/22 2100  ceFEPIme (MAXIPIME) 2 g in sodium chloride 0.9 % 100 mL IVPB  Status:  Discontinued        2 g 200 mL/hr over 30 Minutes Intravenous Every 8 hours 12/28/22 1512 12/28/22 2127   12/28/22 2100  metroNIDAZOLE (FLAGYL) IVPB 500 mg        500 mg 100 mL/hr over 60 Minutes Intravenous Every 12 hours 12/28/22 1512     12/28/22 1600  vancomycin (VANCOREADY) IVPB 2000 mg/400 mL        2,000 mg 200 mL/hr over 120 Minutes Intravenous  Once 12/28/22 1512 12/28/22 1752   12/28/22 1230  piperacillin-tazobactam (ZOSYN) IVPB 3.375 g  Status:  Discontinued        3.375 g 12.5 mL/hr over 240 Minutes Intravenous Every 8 hours 12/28/22 1116 12/28/22 1503   12/21/22 0707  ceFAZolin (ANCEF) 3-0.9 GM/100ML-% IVPB       Note to Pharmacy: Blair [facility] B: cabinet override      12/21/22 0707 12/21/22 1914   12/21/22 0600  ceFAZolin (ANCEF) IVPB 2g/100 mL premix  Status:  Discontinued        2 g 200 mL/hr over 30 Minutes Intravenous On call to O.R. 12/21/22 0541 12/21/22 1058       Subjective: Seen and examined at bedside he is extremely frustrated complaining of significant abdominal discomfort.  Also complaining about having quite a bit of diarrhea still.  No nausea or vomiting.  Denies any chest pain and wanting to leave.  No other concerns or points this time.  Objective: Vitals:   01/06/23 1231 01/06/23 2008 01/07/23 0446 01/07/23 1510  BP: (!) 111/57 119/64 129/72 (!) 121/54  Pulse: 88 94 (!) 104 86  Resp: 20 20 20 16   Temp: 98.4 F (36.9 C) 98.8 F (37.1 C) (!) 97.3 F (36.3 C) 98.7 F (37.1 C)  TempSrc: Oral Oral Oral Oral  SpO2: 95% 96% 92% 90%  Weight:      Height:        Intake/Output Summary (Last 24 hours) at 01/07/2023 1536 Last data filed at 01/07/2023 1123 Gross per 24 hour  Intake 1379.21 ml   Output 1295 ml  Net 84.21 ml   Filed Weights   12/21/22 0543  Weight: (!) 235 kg   Examination: Physical Exam:  Constitutional: WN/WD super morbidly obese Caucasian male who appears frustrated and uncomfortable Respiratory: Diminished to auscultation bilaterally, no wheezing, rales, rhonchi or crackles. Normal respiratory effort and patient is not tachypenic. No accessory muscle use.  Unlabored breathing Cardiovascular: RRR, no murmurs / rubs / gallops. S1 and S2 auscultated.  Has maybe 1+ lower extremity edema Abdomen: Soft, tender palpation significant distended and his biliary drain in place. Bowel sounds positive.  GU: Deferred. Musculoskeletal: No clubbing / cyanosis of digits/nails. No joint deformity upper and lower extremities.  Skin: No rashes, lesions, ulcers on the skin evaluation. No induration; Warm and dry.  Neurologic: CN 2-12 grossly intact with no focal deficits. Romberg sign and cerebellar reflexes not assessed.  Psychiatric: Normal judgment and insight. Alert and oriented x 3.  Appears frustrated and anxious  Data Reviewed: I have personally reviewed following labs and imaging studies  CBC: Recent Labs  Lab 01/03/23 0325 01/04/23 0445 01/05/23 0419 01/06/23 0720 01/07/23 0742  WBC 15.9* 17.7* 17.5* 15.8* 16.4*  NEUTROABS  --  11.0* 11.5* 11.4* 12.2*  HGB 11.1* 11.4* 11.4* 11.0* 10.8*  HCT 35.9* 36.4* 35.8* 35.9* 35.3*  MCV 90.0 90.8 89.7 92.1 91.0  PLT 177 205 220 240 269   Basic Metabolic Panel: Recent Labs  Lab 01/03/23 0324 01/03/23 0325 01/04/23 0445 01/05/23 0419 01/06/23 0720 01/07/23 0511  NA  --  138 136 134* 137 138  K  --  3.0* 3.2* 3.7 3.6 4.2  CL  --  101 100 97* 100 102  CO2  --  28 27 29 28 26   GLUCOSE  --  145* 117* 113* 113* 115*  BUN  --  43* 22* 16 11 8   CREATININE  --  0.72 0.45* 0.55* 0.38* 0.44*  CALCIUM  --  7.9* 7.6* 7.4* 7.7* 7.5*  MG 2.1  --  1.5* 1.7 1.7 1.5*  PHOS  1.8*  --  1.9* 2.6 2.9 3.6   GFR: Estimated  Creatinine Clearance: 239.1 mL/min (A) (by C-G formula based on SCr of 0.44 mg/dL (L)). Liver Function Tests: Recent Labs  Lab 01/02/23 0341 01/04/23 0445 01/05/23 0419 01/06/23 0720 01/07/23 0511  AST 14* 12* 18 11* 14*  ALT 28 16 13 11 14   ALKPHOS 106 68 73 64 69  BILITOT 0.8 0.8 1.0 0.7 0.5  PROT 6.0* 5.6* 5.5* 5.3* 5.5*  ALBUMIN 2.3* 2.1* 2.1* 2.1* 2.0*   No results for input(s): "LIPASE", "AMYLASE" in the last 168 hours. No results for input(s): "AMMONIA" in the last 168 hours. Coagulation Profile: No results for input(s): "INR", "PROTIME" in the last 168 hours. Cardiac Enzymes: No results for input(s): "CKTOTAL", "CKMB", "CKMBINDEX", "TROPONINI" in the last 168 hours. BNP (last 3 results) No results for input(s): "PROBNP" in the last 8760 hours. HbA1C: No results for input(s): "HGBA1C" in the last 72 hours. CBG: Recent Labs  Lab 01/06/23 2014 01/06/23 2344 01/07/23 0337 01/07/23 0726 01/07/23 1139  GLUCAP 91 113* 105* 107* 110*   Lipid Profile: No results for input(s): "CHOL", "HDL", "LDLCALC", "TRIG", "CHOLHDL", "LDLDIRECT" in the last 72 hours. Thyroid Function Tests: No results for input(s): "TSH", "T4TOTAL", "FREET4", "T3FREE", "THYROIDAB" in the last 72 hours. Anemia Panel: Recent Labs    01/06/23 0720  VITAMINB12 633  FOLATE 8.3  FERRITIN 530*  TIBC 192*  IRON 26*  RETICCTPCT 1.2   Sepsis Labs: Recent Labs  Lab 01/04/23 0445  PROCALCITON 3.33  LATICACIDVEN 0.8   Recent Results (from the past 240 hour(s))  Culture, blood (Routine X 2) w Reflex to ID Panel     Status: None (Preliminary result)   Collection Time: 01/04/23 11:29 AM   Specimen: BLOOD RIGHT HAND  Result Value Ref Range Status   Specimen Description   Final    BLOOD RIGHT HAND Performed at [facility], [address] [place], [place] [address]    Special Requests   Final    BOTTLES DRAWN AEROBIC AND ANAEROBIC Blood Culture adequate volume Performed at [facility], [address] [place], [place] [address]    Culture   Final    NO GROWTH 3 DAYS Performed at [facility], [address], [place], [place] [address]    Report Status PENDING  Incomplete  Culture, blood (Routine X 2) w Reflex to ID Panel     Status: None (Preliminary result)   Collection Time: 01/04/23 11:29 AM   Specimen: BLOOD RIGHT ARM  Result Value Ref Range Status   Specimen Description   Final    BLOOD RIGHT ARM Performed at [facility], [address], [place], [place] [address]    Special Requests   Final    BOTTLES DRAWN AEROBIC AND ANAEROBIC Blood Culture adequate volume Performed at [facility], [address], [place], [place] [address]    Culture   Final    NO GROWTH 3 DAYS Performed at [facility], [address], [place], [place] [address]    Report Status PENDING  Incomplete  Gastrointestinal Panel by PCR , Stool     Status: None   Collection Time: 01/06/23  4:38 PM   Specimen: Stool  Result Value Ref Range Status   Campylobacter species NOT DETECTED NOT DETECTED Final   Plesimonas shigelloides NOT DETECTED NOT DETECTED Final   Salmonella species NOT DETECTED NOT DETECTED Final   Yersinia enterocolitica  NOT DETECTED NOT DETECTED Final   Vibrio species NOT DETECTED NOT DETECTED Final   Vibrio cholerae NOT DETECTED NOT DETECTED Final   Enteroaggregative E coli (EAEC) NOT DETECTED NOT DETECTED Final   Enteropathogenic E coli (EPEC) NOT DETECTED NOT DETECTED Final   Enterotoxigenic E coli (ETEC) NOT DETECTED NOT DETECTED Final   Shiga like toxin producing E coli (STEC) NOT DETECTED NOT DETECTED Final   Shigella/Enteroinvasive E coli (EIEC) NOT DETECTED NOT DETECTED Final   Cryptosporidium NOT DETECTED NOT DETECTED Final   Cyclospora cayetanensis NOT DETECTED NOT DETECTED Final   Entamoeba histolytica NOT DETECTED NOT DETECTED Final   Giardia lamblia NOT DETECTED NOT DETECTED Final   Adenovirus F40/41  NOT DETECTED NOT DETECTED Final   Astrovirus NOT DETECTED NOT DETECTED Final   Norovirus GI/GII NOT DETECTED NOT DETECTED Final   Rotavirus A NOT DETECTED NOT DETECTED Final   Sapovirus (I, II, IV, and V) NOT DETECTED NOT DETECTED Final    Comment: Performed at [facility], [address] [place], [place] [address]    Radiology Studies: CT ABDOMEN PELVIS W CONTRAST  Result Date: 01/06/2023 CLINICAL DATA:  Abdominal pain. Two weeks postop from laparoscopic cholecystectomy. EXAM: CT ABDOMEN AND PELVIS WITH CONTRAST TECHNIQUE: Multidetector CT imaging of the abdomen and pelvis was performed using the standard protocol following bolus administration of intravenous contrast. RADIATION DOSE REDUCTION: This exam was performed according to the departmental dose-optimization program which includes automated exposure control, adjustment of the mA and/or kV according to patient size and/or use of iterative reconstruction technique. CONTRAST:  OMNIPAQUE IOHEXOL 300 MG/ML  SOLN COMPARISON:  12/28/2022 FINDINGS: Significant image degradation noted due to beam attenuation artifact from large body habitus. Lower Chest: Tiny bilateral pleural effusions and mild bilateral lower lobe atelectasis. Hepatobiliary: Mild diffuse hepatic steatosis again noted. No suspicious hepatic masses identified. Prior cholecystectomy again noted. No evidence of biliary ductal dilatation. Right upper quadrant surgical drain remains in place. A new fluid collection is seen within gastrohepatic ligament which measures 9.7 x 7.0 cm. Small amount of free fluid seen along inferior margin of right hepatic lobe and in left lower quadrant. Pancreas:  No mass or inflammatory changes. Spleen: Within normal limits in size and appearance. 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 No suspicious renal masses identified. No evidence of  ureteral calculi or hydronephrosis. Foley catheter seen within the bladder. Stomach/Bowel: No evidence of bowel obstruction or other acute findings Vascular/Lymphatic: No pathologically enlarged lymph nodes. No acute vascular findings. Reproductive:  No mass or other significant abnormality. Other:  None. Musculoskeletal:  No suspicious bone lesions identified. IMPRESSION: Image degraded exam due to beam hardening artifact secondary to large habitus. New 9.7 cm fluid collection in gastrohepatic ligament, which may be due to biloma or abscess. Consider nuclear medicine hepatobiliary scan to evaluate for bile leak. Small amount of free fluid in right paracolic gutter and left lower quadrant. Tiny bilateral pleural effusions and mild bilateral lower lobe atelectasis. Electronically Signed   By: Danae Orleans M.D.   On: 01/06/2023 15:39    Scheduled Meds:  allopurinol  100 mg Oral Daily   Chlorhexidine Gluconate Cloth  6 each Topical Daily   colchicine  0.6 mg Oral Daily   gabapentin  300 mg Oral QID   Gerhardt's butt cream   Topical BID   guaiFENesin  1,200 mg Oral BID   heparin injection (subcutaneous)  5,000 Units Subcutaneous Q8H  hydrocortisone  1 Application Rectal BID   insulin aspart  0-20 Units Subcutaneous Q4H   lidocaine  1 patch Transdermal Daily   loperamide  2 mg Oral BID   nystatin   Topical BID   pantoprazole (PROTONIX) IV  40 mg Intravenous Q12H   Ensure Max Protein  11 oz Oral Daily   Continuous Infusions:  sodium chloride Stopped (12/29/22 0059)   sodium chloride Stopped (01/03/23 1522)   ceFEPime (MAXIPIME) IV 2 g (01/07/23 1209)   methocarbamol (ROBAXIN) IV Stopped (01/02/23 0306)   metronidazole 500 mg (01/07/23 1153)   ondansetron (ZOFRAN) IV      LOS: 16 days   Marguerita Merles, DO [facility] Available via Epic secure chat 7am-7pm After these hours, please refer to coverage provider listed on [email or web address] 01/07/2023, 3:36 PM

## 2023-01-08 ENCOUNTER — Inpatient Hospital Stay (HOSPITAL_COMMUNITY): Payer: Medicaid Other

## 2023-01-08 DIAGNOSIS — N179 Acute kidney failure, unspecified: Secondary | ICD-10-CM | POA: Diagnosis not present

## 2023-01-08 DIAGNOSIS — Z7401 Bed confinement status: Secondary | ICD-10-CM | POA: Diagnosis not present

## 2023-01-08 DIAGNOSIS — K8010 Calculus of gallbladder with chronic cholecystitis without obstruction: Secondary | ICD-10-CM | POA: Diagnosis not present

## 2023-01-08 DIAGNOSIS — J9601 Acute respiratory failure with hypoxia: Secondary | ICD-10-CM | POA: Diagnosis not present

## 2023-01-08 LAB — CBC WITH DIFFERENTIAL/PLATELET
Abs Immature Granulocytes: 0.28 10*3/uL — ABNORMAL HIGH (ref 0.00–0.07)
Basophils Absolute: 0 10*3/uL (ref 0.0–0.1)
Basophils Relative: 0 %
Eosinophils Absolute: 0.3 10*3/uL (ref 0.0–0.5)
Eosinophils Relative: 2 %
HCT: 36.9 % — ABNORMAL LOW (ref 39.0–52.0)
Hemoglobin: 11.3 g/dL — ABNORMAL LOW (ref 13.0–17.0)
Immature Granulocytes: 2 %
Lymphocytes Relative: 10 %
Lymphs Abs: 1.8 10*3/uL (ref 0.7–4.0)
MCH: 28.3 pg (ref 26.0–34.0)
MCHC: 30.6 g/dL (ref 30.0–36.0)
MCV: 92.3 fL (ref 80.0–100.0)
Monocytes Absolute: 1.5 10*3/uL — ABNORMAL HIGH (ref 0.1–1.0)
Monocytes Relative: 8 %
Neutro Abs: 13.8 10*3/uL — ABNORMAL HIGH (ref 1.7–7.7)
Neutrophils Relative %: 78 %
Platelets: 273 10*3/uL (ref 150–400)
RBC: 4 MIL/uL — ABNORMAL LOW (ref 4.22–5.81)
RDW: 14 % (ref 11.5–15.5)
WBC: 17.6 10*3/uL — ABNORMAL HIGH (ref 4.0–10.5)
nRBC: 0 % (ref 0.0–0.2)

## 2023-01-08 LAB — PROTIME-INR
INR: 1.1 (ref 0.8–1.2)
Prothrombin Time: 14.5 seconds (ref 11.4–15.2)

## 2023-01-08 LAB — COMPREHENSIVE METABOLIC PANEL
ALT: 10 U/L (ref 0–44)
AST: 12 U/L — ABNORMAL LOW (ref 15–41)
Albumin: 2 g/dL — ABNORMAL LOW (ref 3.5–5.0)
Alkaline Phosphatase: 71 U/L (ref 38–126)
Anion gap: 8 (ref 5–15)
BUN: 8 mg/dL (ref 6–20)
CO2: 29 mmol/L (ref 22–32)
Calcium: 7.7 mg/dL — ABNORMAL LOW (ref 8.9–10.3)
Chloride: 100 mmol/L (ref 98–111)
Creatinine, Ser: 0.54 mg/dL — ABNORMAL LOW (ref 0.61–1.24)
GFR, Estimated: >60 mL/min (ref >60)
Glucose, Bld: 115 mg/dL — ABNORMAL HIGH (ref 70–99)
Potassium: 3.6 mmol/L (ref 3.5–5.1)
Sodium: 137 mmol/L (ref 135–145)
Total Bilirubin: 0.5 mg/dL (ref 0.3–1.2)
Total Protein: 5.8 g/dL — ABNORMAL LOW (ref 6.5–8.1)

## 2023-01-08 LAB — GLUCOSE, CAPILLARY
Glucose-Capillary: 110 mg/dL — ABNORMAL HIGH (ref 70–99)
Glucose-Capillary: 106 mg/dL — ABNORMAL HIGH (ref 70–99)
Glucose-Capillary: 91 mg/dL (ref 70–99)
Glucose-Capillary: 116 mg/dL — ABNORMAL HIGH (ref 70–99)
Glucose-Capillary: 153 mg/dL — ABNORMAL HIGH (ref 70–99)

## 2023-01-08 LAB — PHOSPHORUS: Phosphorus: 3.4 mg/dL (ref 2.5–4.6)

## 2023-01-08 LAB — MAGNESIUM: Magnesium: 1.9 mg/dL (ref 1.7–2.4)

## 2023-01-08 MED ORDER — MIDAZOLAM HCL 2 MG/2ML IJ SOLN
INTRAMUSCULAR | Status: AC | PRN
Start: 2023-01-08 — End: ?
  Administered 2023-01-08: 1 mg via INTRAVENOUS

## 2023-01-08 MED ORDER — FLUMAZENIL 0.5 MG/5ML IV SOLN
INTRAVENOUS | Status: AC
  Filled 2023-01-08: qty 5

## 2023-01-08 MED ORDER — FENTANYL CITRATE (PF) 100 MCG/2ML IJ SOLN
INTRAMUSCULAR | Status: AC | PRN
Start: 2023-01-08 — End: ?
  Administered 2023-01-08: 50 ug via INTRAVENOUS

## 2023-01-08 MED ORDER — HEPARIN SODIUM (PORCINE) 5000 UNIT/ML IJ SOLN
5000.0000 [IU] | Freq: Three times a day (TID) | INTRAMUSCULAR | Status: DC
  Administered 2023-01-08 – 2023-01-12 (×11): 5000 [IU] via SUBCUTANEOUS
  Filled 2023-01-08 (×11): qty 1

## 2023-01-08 MED ORDER — FENTANYL CITRATE (PF) 100 MCG/2ML IJ SOLN
INTRAMUSCULAR | Status: DC | PRN
  Administered 2023-01-08: 100 ug via INTRAVENOUS

## 2023-01-08 MED ORDER — MIDAZOLAM HCL 2 MG/2ML IJ SOLN
INTRAMUSCULAR | Status: AC
  Filled 2023-01-08: qty 6

## 2023-01-08 MED ORDER — OXYCODONE HCL 5 MG PO TABS: 5.0000 mg | ORAL_TABLET | ORAL | Status: DC | PRN

## 2023-01-08 MED ORDER — FENTANYL CITRATE (PF) 100 MCG/2ML IJ SOLN
INTRAMUSCULAR | Status: AC
  Filled 2023-01-08: qty 4

## 2023-01-08 MED ORDER — NALOXONE HCL 0.4 MG/ML IJ SOLN
INTRAMUSCULAR | Status: AC
  Filled 2023-01-08: qty 1

## 2023-01-08 MED ORDER — SODIUM CHLORIDE 0.9% FLUSH
5.0000 mL | Freq: Three times a day (TID) | INTRAVENOUS | Status: DC
  Administered 2023-01-08 – 2023-01-12 (×10): 5 mL

## 2023-01-08 NOTE — Progress Notes (Signed)
MEDICATION-RELATED CONSULT NOTE   IR Procedure Consult - Anticoagulant/Antiplatelet PTA/Inpatient Med List Review by Pharmacist    Procedure: ultrasound and CT guided placement of percutaneous drainage catheter      Completed: 01/08/23 ~ 1300  Post-Procedural bleeding risk per IR MD assessment: low   Antithrombotic medications on inpatient or PTA profile prior to procedure: Heparin 5000 units SQ q8h    Recommended restart time per IR Post-Procedure Guidelines:  at least 4 hours after procedure or at next standard dose interval    Plan:     Resume Heparin 5000 units SQ q8h at 2200 today (next standard dose interval).  Pharmacy to sign off.  Greer Pickerel, PharmD, BCPS Clinical Pharmacist 01/08/2023 2:15 PM

## 2023-01-08 NOTE — Progress Notes (Addendum)
18 Days Post-Op  Subjective: Pain issues continued overnight  ROS: See above, otherwise other systems negative  Objective: Vital signs in last 24 hours: Temp:  [98.7 F (37.1 C)-98.8 F (37.1 C)] 98.8 F (37.1 C) (09/23 0517) Pulse Rate:  [86-92] 91 (09/23 0517) Resp:  [16-18] 18 (09/23 0517) BP: (104-127)/(54-71) 104/56 (09/23 0517) SpO2:  [90 %-99 %] 93 % (09/23 0517) Last BM Date : 01/07/23  Intake/Output from previous day: 09/22 0701 - 09/23 0700 In: 360 [P.O.:360] Out: 2575 [Urine:2300; Drains:275] Intake/Output this shift: Total I/O In: 310 [IV Piggyback:310] Out: -   PE: Gen: NAD Resp: nonlabored CV: RRR Abd: soft, bilious output from drain, incisions c/d/i  Lab Results:  Recent Labs    01/07/23 0742 01/08/23 0346  WBC 16.4* 17.6*  HGB 10.8* 11.3*  HCT 35.3* 36.9*  PLT 269 273   BMET Recent Labs    01/07/23 0511 01/08/23 0346  NA 138 137  K 4.2 3.6  CL 102 100  CO2 26 29  GLUCOSE 115* 115*  BUN 8 8  CREATININE 0.44* 0.54*  CALCIUM 7.5* 7.7*   PT/INR Recent Labs    01/08/23 0346  LABPROT 14.5  INR 1.1   CMP     Component Value Date/Time   NA 137 01/08/2023 0346   NA 139 11/14/2012 0000   K 3.6 01/08/2023 0346   K 4.1 11/14/2012 0000   CL 100 01/08/2023 0346   CO2 29 01/08/2023 0346   GLUCOSE 115 (H) 01/08/2023 0346   BUN 8 01/08/2023 0346   BUN 11 11/14/2012 0000   CREATININE 0.54 (L) 01/08/2023 0346   CREATININE 0.62 11/14/2012 0000   CALCIUM 7.7 (L) 01/08/2023 0346   PROT 5.8 (L) 01/08/2023 0346   ALBUMIN 2.0 (L) 01/08/2023 0346   ALBUMIN 2.6 11/14/2012 0000   AST 12 (L) 01/08/2023 0346   ALT 10 01/08/2023 0346   ALKPHOS 71 01/08/2023 0346   BILITOT 0.5 01/08/2023 0346   GFRNONAA >60 01/08/2023 0346   GFRAA >60 12/30/2017 0619   Lipase     Component Value Date/Time   LIPASE 14 12/30/2022 0938    Studies/Results: CT ABDOMEN PELVIS W CONTRAST  Result Date: 01/06/2023 CLINICAL DATA:  Abdominal pain. Two weeks  postop from laparoscopic cholecystectomy. EXAM: CT ABDOMEN AND PELVIS WITH CONTRAST TECHNIQUE: Multidetector CT imaging of the abdomen and pelvis was performed using the standard protocol following bolus administration of intravenous contrast. RADIATION DOSE REDUCTION: This exam was performed according to the departmental dose-optimization program which includes automated exposure control, adjustment of the mA and/or kV according to patient size and/or use of iterative reconstruction technique. CONTRAST:  OMNIPAQUE IOHEXOL 300 MG/ML  SOLN COMPARISON:  12/28/2022 FINDINGS: Significant image degradation noted due to beam attenuation artifact from large body habitus. Lower Chest: Tiny bilateral pleural effusions and mild bilateral lower lobe atelectasis. Hepatobiliary: Mild diffuse hepatic steatosis again noted. No suspicious hepatic masses identified. Prior cholecystectomy again noted. No evidence of biliary ductal dilatation. Right upper quadrant surgical drain remains in place. A new fluid collection is seen within gastrohepatic ligament which measures 9.7 x 7.0 cm. Small amount of free fluid seen along inferior margin of right hepatic lobe and in left lower quadrant. Pancreas:  No mass or inflammatory changes. Spleen: Within normal limits in size and appearance. 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 No suspicious renal masses identified. No evidence of ureteral calculi or hydronephrosis. Foley  catheter seen within the bladder. Stomach/Bowel: No evidence of bowel obstruction or other acute findings Vascular/Lymphatic: No pathologically enlarged lymph nodes. No acute vascular findings. Reproductive:  No mass or other significant abnormality. Other:  None. Musculoskeletal:  No suspicious bone lesions identified. IMPRESSION: Image degraded exam due to beam hardening artifact secondary to large habitus. New 9.7  cm fluid collection in gastrohepatic ligament, which may be due to biloma or abscess. Consider nuclear medicine hepatobiliary scan to evaluate for bile leak. Small amount of free fluid in right paracolic gutter and left lower quadrant. Tiny bilateral pleural effusions and mild bilateral lower lobe atelectasis. Electronically Signed   By: Danae Orleans M.D.   On: 01/06/2023 15:39    Anti-infectives: Anti-infectives (From admission, onward)    Start     Dose/Rate Route Frequency Ordered Stop   01/01/23 2000  ceFEPIme (MAXIPIME) 2 g in sodium chloride 0.9 % 100 mL IVPB        2 g 200 mL/hr over 30 Minutes Intravenous Every 8 hours 01/01/23 1311     12/31/22 1245  linezolid (ZYVOX) IVPB 600 mg  Status:  Discontinued        600 mg 300 mL/hr over 60 Minutes Intravenous 2 times daily 12/31/22 1145 01/02/23 1146   12/29/22 2200  ceFEPIme (MAXIPIME) 2 g in sodium chloride 0.9 % 100 mL IVPB  Status:  Discontinued        2 g 200 mL/hr over 30 Minutes Intravenous Every 12 hours 12/28/22 2127 12/29/22 1401   12/29/22 1500  ceFEPIme (MAXIPIME) 2 g in sodium chloride 0.9 % 100 mL IVPB  Status:  Discontinued        2 g 200 mL/hr over 30 Minutes Intravenous 2 times daily 12/29/22 1401 01/01/23 1311   12/29/22 1215  linezolid (ZYVOX) IVPB 600 mg  Status:  Discontinued        600 mg 300 mL/hr over 60 Minutes Intravenous Every 12 hours 12/29/22 1120 12/31/22 1145   12/29/22 0400  vancomycin (VANCOREADY) IVPB 1250 mg/250 mL  Status:  Discontinued        1,250 mg 166.7 mL/hr over 90 Minutes Intravenous Every 12 hours 12/28/22 1512 12/28/22 2127   12/28/22 2126  vancomycin variable dose per unstable renal function (pharmacist dosing)  Status:  Discontinued         Does not apply See admin instructions 12/28/22 2127 12/29/22 1120   12/28/22 2100  ceFEPIme (MAXIPIME) 2 g in sodium chloride 0.9 % 100 mL IVPB  Status:  Discontinued        2 g 200 mL/hr over 30 Minutes Intravenous Every 8 hours 12/28/22 1512 12/28/22  2127   12/28/22 2100  metroNIDAZOLE (FLAGYL) IVPB 500 mg        500 mg 100 mL/hr over 60 Minutes Intravenous Every 12 hours 12/28/22 1512     12/28/22 1600  vancomycin (VANCOREADY) IVPB 2000 mg/400 mL        2,000 mg 200 mL/hr over 120 Minutes Intravenous  Once 12/28/22 1512 12/28/22 1752   12/28/22 1230  piperacillin-tazobactam (ZOSYN) IVPB 3.375 g  Status:  Discontinued        3.375 g 12.5 mL/hr over 240 Minutes Intravenous Every 8 hours 12/28/22 1116 12/28/22 1503   12/21/22 0707  ceFAZolin (ANCEF) 3-0.9 GM/100ML-% IVPB       Note to Pharmacy: [facility]: cabinet override      12/21/22 0707 12/21/22 1914   12/21/22 0600  ceFAZolin (ANCEF) IVPB 2g/100 mL premix  Status:  Discontinued        2 g 200 mL/hr over 30 Minutes Intravenous On call to O.R. 12/21/22 0541 12/21/22 1058       Assessment/Plan  S/p partial cholecystectomy, bile leak, new biloma on CT scan   Diarrhea stool panel negative, on imodium  FEN - NPO for procedure VTE - lovenox ID - cefepime/flagyl Dispo - IR aspiration/drain today  I reviewed last 24 h vitals and pain scores, last 48 h intake and output, last 24 h labs and trends, and last 24 h imaging results.  This care required moderate level of medical decision making.    LOS: 17 days   [facility] 01/08/2023, 10:40 AM Please see Amion for pager number during day hours 7:00am-4:30pm or 7:00am -11:30am on weekends

## 2023-01-08 NOTE — Plan of Care (Signed)
Monitor labs and vital signs. Chg bath as ordered. Turn frequently. Skin care protocol. Use skin care products as ordered. Reduce friction as much as possible with position changes. Keep linens as wrinkle free as possible. Foley care each shift. Change jp drain dressing daily and prn. Monitor drain output. Encourage adequate intake. Medicate as needed for complaint of pain. Continue current plan of care.

## 2023-01-08 NOTE — Progress Notes (Signed)
PROGRESS NOTE    [name]  NWG:[id number] DOB: 1985-06-10 DOA: 12/21/2022 PCP: Toma Deiters, MD   Brief Narrative:  The Patient [name] is a 37 y/o male with obesity, NASH who presented with acute cholecystitis requiring cholecystectomy, which was complicated by his hepatomegaly. He had a bile leak post-op requiring a drain  Significant Hospital Events:    9/5 - Underwent elective lap chole with CCS. Partial cholecystectomy completed in the setting of poor visualization. EBL . Drain left in place. 9/12 - Transferred to SDU for worsening hypoxia, tachypnea, tachycardia. Hypotensive. CT Chest/A/P with postoperative changes, surgical drain in gallbladder fossa, minimal L basilar subsegmental atelectasis, L adrenal adenoma. TRH consulted. Fluids/Zosyn given, broadened to vanc/cefepime/Flagyl. Refused BiPAP/stated he would refuse intubation even if it meant death. Made DNR. Later in shift more hypotensive, PCCM consulted for pressors. Code status changed back to full code. Vasopressors initiated. 9/13 - A-line, LIJ CVC placed. High pressor requirements with NE 38, Neo 70, vaso 0.03. 9/16: neo stopped 9/13. NE and vaso stopped 9/15. Duoneb for wheezing. A-line removed. Transfer to SDU.  9/17: CCM requested TRH to continue to follow for medical management.  Surgery remains primary service.  9/18 -Had some Transient Bradycardia and ? Pause; will replete electrolytes and continue to monitor.  Patient's renal function is improved  9/19 -Spiked a temperature yesterday and so we will obtain blood cultures x 2 and workup with fever given that WBC slightly worsened to 17.7.   9/20 - WBC still about the same. Repeat CT Scan pending. Patient states he did not sleep very well.   9/21 -having quite a bit of diarrhea refuses for his rectal tube to be reinserted.  Appears frustrated and wanting to leave.    01/07/2023-CT scan shows new 9.7 cm fluid collection in interventional radiology  has not been consulted.  Will continue cefepime and Flagyl and intramedullary is recommending for aspiration possible drain placement on 01/08/2023  01/08/2023 -plan is for interventional radiology to aspirate the new intra-abdominal fluid collection versus placing another drain.  Continues to have diarrhea and abdominal discomfort.  Assessment and Plan:  Chronic Calculous Cholecystitis, sepsis from biliary source, improving slowly Biliary leak, post operatively.  New 9.7 cm fluid collection in the gastrohepatic ligament -Currently off vasopressors, sepsis physiology improving but still spiking intermittent temp, BP now elevated -WBC Trend: Recent Labs  Lab 01/02/23 0341 01/03/23 0325 01/04/23 0445 01/05/23 0419 01/06/23 0720 01/07/23 0742 01/08/23 0346  WBC 20.4* 15.9* 17.7* 17.5* 15.8* 16.4* 17.6*  -On broad-spectrum antibiotics and will continue IV cefepime and IV Flagyl, management per general surgery -Continues to spike some intermittent temperatures and had a Tmax of 100.6 several ago will need to continue monitor carefully and ordered Blood Cx x2; -Repeat CXR done and showed "Increasing streaky bibasilar opacities, which may represent atelectasis or infection. Cardiomegaly with pulmonary vascular congestion." -U/A done and showed a hazy appearance with small hemoglobin, large leukocytes, negative nitrites, rare bacteria, 11-20 RBC per high-power field and 21-50 WBCs -Repeat CT Scan of the Abdomen and Pelvis done and showed "Image degraded exam due to beam hardening artifact secondary to large habitus. New 9.7 cm fluid collection in gastrohepatic ligament, which may be due to biloma or abscess. Consider nuclear medicine hepatobiliary scan to evaluate for bile leak. Small amount of free fluid in right paracolic gutter and left lower quadrant.Tiny bilateral pleural effusions and mild bilateral lower lobe atelectasis."  -Now patient is complaining about significant diarrhea so we will do  a GI pathogen panel which was NEGATIVE -Procalcitonin level was 3.33 however on admission it was greater than 150; repeat in the a.m. -Seen by GI this admission, plan and timing for ERCP per GI -Blood cultures 1/2 staph epi, currently on Cefepime and Flagyl; blood cultures x 2 being reobtained and repeat blood culture showing no growth to date at 2 days -Biliary drain in place and general surgery feels that he does not need a HIDA scan given his suspected bile leak given other drain output and partial cholecystectomy  -Patient is on Carb Modified Diet now but now general surgery has asked interventional radiology to evaluate for his new fluid collection in concern for abscess he will need a another drain -Plan is for interventional radiology to evaluate and he is to be n.p.o. at midnight for possible drain placement versus aspiration as well as obtaining a CBC and iron in the a.m. and holding his subcu heparin -Currently awaiting aspiration and drain placement possibly   Acute on chronic respiratory failure with hypoxia -Multifactorial secondary to aggressive fluid resuscitation during shock, OSA, OHS - BiPAP as needed, received IV Lasix 40 mg x 1 on 9/16, 9/17, 9/19 (per PCCM) for fluid overload -Patient finally has a negative balance is -947.9 mL -Holding off giving him a dose of IV Lasix today SpO2: 94 % O2 Flow Rate (L/min): 2 L/min FiO2 (%): 35 % -Continuous Pulse Oximetry and maintain O2 saturation greater than 90% -Continue supplemental oxygen via nasal cannula and wean oxygen to room air -Repeat CXR done 01/04/2023 showed "Increasing streaky bibasilar opacities, which may represent atelectasis or infection. Cardiomegaly with pulmonary vascular congestion." -Initiate Flutter Valve, Incentive Spirometry, Guaifenesin 1200 po BID -Continue to monitor oxygen requirements as necessary   Electrolyte Abnormalities including Hypokalemia and Hypophosphatemia and Hypomagnesemia -K, Mag, and Phos  Level Trend: Recent Labs  Lab 01/02/23 0341 01/03/23 0324 01/03/23 0325 01/04/23 0445 01/05/23 0419 01/06/23 0720 01/07/23 0511 01/08/23 0346  K 3.5  --  3.0* 3.2* 3.7 3.6 4.2 3.6  MG  --    < >  --  1.5* 1.7 1.7 1.5* 1.9  PHOS  --    < >  --  1.9* 2.6 2.9 3.6 3.4   < > = values in this interval not displayed.  -Conitnue To monitor replete as necessary repeat CMP in a.m. along with Phos and mag  OHS/OSA -Largely related to habitus, morbid obesity -BiPAP as above  AKI, improved and resolved  -In the setting of hypotension, sepsis.  Likely ATN and combination of obstructive physiology, unable to pass urine prior to Foley placement.   - Status post Foley placement with a cystoscopy 9/14 by Urology -BUN/Cr Trend: Recent Labs  Lab 01/02/23 0341 01/03/23 0325 01/04/23 0445 01/05/23 0419 01/06/23 0720 01/07/23 0511 01/08/23 0346  BUN 58* 43* 22* 16 11 8 8   CREATININE 1.34* 0.72 0.45* 0.55* 0.38* 0.44* 0.54*  -Avoid Nephrotoxic Medications, Contrast Dyes, Hypotension and Dehydration to Ensure Adequate Renal Perfusion and will need to Renally Adjust Meds -Follow Renal Fxn Closely with Diuresis and will hold off on giving him another dose of Lasix today -Continue to Monitor and Trend Renal Function carefully and repeat CMP in the AM   Normocytic Anemia -Hgb/Hct Trend: Recent Labs  Lab 01/02/23 0341 01/03/23 0325 01/04/23 0445 01/05/23 0419 01/06/23 0720 01/07/23 0742 01/08/23 0346  HGB 10.3* 11.1* 11.4* 11.4* 11.0* 10.8* 11.3*  HCT 33.4* 35.9* 36.4* 35.8* 35.9* 35.3* 36.9*  MCV 90.5 90.0 90.8 89.7 92.1 91.0  92.3  -Checked Anemia Panel and showed an iron level of 26, UIBC of 166, TIBC 192, saturation ratios of 14%, ferritin level 530, folate level of 8.3, vitamin B12 633 -Continue to monitor for signs and symptoms bleeding; no overt bleeding noted -Repeat CBC in the AM  Hypoalbuminemia -Patient's Albumin Trend: Recent Labs  Lab 01/01/23 0500 01/02/23 0341  01/04/23 0445 01/05/23 0419 01/06/23 0720 01/07/23 0511 01/08/23 0346  ALBUMIN 2.3* 2.3* 2.1* 2.1* 2.1* 2.0* 2.0*  -Continue to Monitor and Trend and repeat CMP in the AM  Super Morbid Obesity -Complicates overall prognosis and care -Estimated body mass index is 81.14 kg/m as calculated from the following:   Height as of this encounter: 5\' 7"  (1.702 m).   Weight as of this encounter: 235 kg.  -Weight Loss and Dietary Counseling given   DVT prophylaxis: heparin injection 5,000 Units Start: 01/08/23 2200 SCD's Start: 12/21/22 1116    Code Status: Full Code Family Communication: No family present at bedside   Disposition Plan:  Level of care: Progressive Status is: Inpatient Remains inpatient appropriate because: Undergoing a aspiration versus drain placement later today and will need further clinical improvement and clearance by the primary team   Consultants:  General Surgery Interventional Radiology PCCM TRH  Procedures:  As delineated as above  Antimicrobials:  Anti-infectives (From admission, onward)    Start     Dose/Rate Route Frequency Ordered Stop   01/01/23 2000  ceFEPIme (MAXIPIME) 2 g in sodium chloride 0.9 % 100 mL IVPB        2 g 200 mL/hr over 30 Minutes Intravenous Every 8 hours 01/01/23 1311     12/31/22 1245  linezolid (ZYVOX) IVPB 600 mg  Status:  Discontinued        600 mg 300 mL/hr over 60 Minutes Intravenous 2 times daily 12/31/22 1145 01/02/23 1146   12/29/22 2200  ceFEPIme (MAXIPIME) 2 g in sodium chloride 0.9 % 100 mL IVPB  Status:  Discontinued        2 g 200 mL/hr over 30 Minutes Intravenous Every 12 hours 12/28/22 2127 12/29/22 1401   12/29/22 1500  ceFEPIme (MAXIPIME) 2 g in sodium chloride 0.9 % 100 mL IVPB  Status:  Discontinued        2 g 200 mL/hr over 30 Minutes Intravenous 2 times daily 12/29/22 1401 01/01/23 1311   12/29/22 1215  linezolid (ZYVOX) IVPB 600 mg  Status:  Discontinued        600 mg 300 mL/hr over 60 Minutes  Intravenous Every 12 hours 12/29/22 1120 12/31/22 1145   12/29/22 0400  vancomycin (VANCOREADY) IVPB 1250 mg/250 mL  Status:  Discontinued        1,250 mg 166.7 mL/hr over 90 Minutes Intravenous Every 12 hours 12/28/22 1512 12/28/22 2127   12/28/22 2126  vancomycin variable dose per unstable renal function (pharmacist dosing)  Status:  Discontinued         Does not apply See admin instructions 12/28/22 2127 12/29/22 1120   12/28/22 2100  ceFEPIme (MAXIPIME) 2 g in sodium chloride 0.9 % 100 mL IVPB  Status:  Discontinued        2 g 200 mL/hr over 30 Minutes Intravenous Every 8 hours 12/28/22 1512 12/28/22 2127   12/28/22 2100  metroNIDAZOLE (FLAGYL) IVPB 500 mg        500 mg 100 mL/hr over 60 Minutes Intravenous Every 12 hours 12/28/22 1512     12/28/22 1600  vancomycin (VANCOREADY) IVPB 2000 mg/400 mL  2,000 mg 200 mL/hr over 120 Minutes Intravenous  Once 12/28/22 1512 12/28/22 1752   12/28/22 1230  piperacillin-tazobactam (ZOSYN) IVPB 3.375 g  Status:  Discontinued        3.375 g 12.5 mL/hr over 240 Minutes Intravenous Every 8 hours 12/28/22 1116 12/28/22 1503   12/21/22 0707  ceFAZolin (ANCEF) 3-0.9 GM/100ML-% IVPB       Note to Pharmacy: Blair [facility] B: cabinet override      12/21/22 0707 12/21/22 1914   12/21/22 0600  ceFAZolin (ANCEF) IVPB 2g/100 mL premix  Status:  Discontinued        2 g 200 mL/hr over 30 Minutes Intravenous On call to O.R. 12/21/22 0541 12/21/22 1058       Subjective: Seen and examined at bedside and he continues to remain frustrated and continues to complain of abdominal pain and diarrhea.  States that he does not feel good and currently waiting an aspiration versus drain placement.  No other concerns or complaints at this time.  Objective: Vitals:   01/08/23 1145 01/08/23 1150 01/08/23 1200 01/08/23 1250  BP: 132/73 118/64 121/75 123/71  Pulse: 98 96 99 92  Resp: (!) 23 19 19 18   Temp:    98.2 F (36.8 C)  TempSrc:    Oral  SpO2: 98% 98%  95% 94%  Weight:      Height:        Intake/Output Summary (Last 24 hours) at 01/08/2023 1457 Last data filed at 01/08/2023 1416 Gross per 24 hour  Intake 310 ml  Output 2265 ml  Net -1955 ml   Filed Weights   12/21/22 0543  Weight: (!) 235 kg   Examination: Physical Exam:  Constitutional: WN/WD super morbid obese Caucasian male who appears frustrated and uncomfortable Respiratory: Diminished to auscultation bilaterally with some coarse breath sounds, no wheezing, rales, rhonchi or crackles. Normal respiratory effort and patient is not tachypenic. No accessory muscle use.  Unlabored breathing Cardiovascular: RRR, no murmurs / rubs / gallops. S1 and S2 auscultated.  Has 1+ extremity edema Abdomen: Soft, tender to palpate and distended secondary to body habitus with biliary drain in place. Bowel sounds positive.  GU: Deferred. Musculoskeletal: No clubbing / cyanosis of digits/nails. No joint deformity upper and lower extremities.  Skin: No rashes, lesions, ulcers on limited skin evaluation. No induration; Warm and dry.  Neurologic: CN 2-12 grossly intact with no focal deficits. Romberg sign and cerebellar reflexes not assessed.  Psychiatric: Normal judgment and insight. Alert and oriented x 3. Normal mood and appropriate affect.   Data Reviewed: I have personally reviewed following labs and imaging studies  CBC: Recent Labs  Lab 01/04/23 0445 01/05/23 0419 01/06/23 0720 01/07/23 0742 01/08/23 0346  WBC 17.7* 17.5* 15.8* 16.4* 17.6*  NEUTROABS 11.0* 11.5* 11.4* 12.2* 13.8*  HGB 11.4* 11.4* 11.0* 10.8* 11.3*  HCT 36.4* 35.8* 35.9* 35.3* 36.9*  MCV 90.8 89.7 92.1 91.0 92.3  PLT 205 220 240 269 273   Basic Metabolic Panel: Recent Labs  Lab 01/04/23 0445 01/05/23 0419 01/06/23 0720 01/07/23 0511 01/08/23 0346  NA 136 134* 137 138 137  K 3.2* 3.7 3.6 4.2 3.6  CL 100 97* 100 102 100  CO2 27 29 28 26 29   GLUCOSE 117* 113* 113* 115* 115*  BUN 22* 16 11 8 8   CREATININE  0.45* 0.55* 0.38* 0.44* 0.54*  CALCIUM 7.6* 7.4* 7.7* 7.5* 7.7*  MG 1.5* 1.7 1.7 1.5* 1.9  PHOS 1.9* 2.6 2.9 3.6 3.4   GFR: Estimated  Creatinine Clearance: 239.1 mL/min (A) (by C-G formula based on SCr of 0.54 mg/dL (L)). Liver Function Tests: Recent Labs  Lab 01/04/23 0445 01/05/23 0419 01/06/23 0720 01/07/23 0511 01/08/23 0346  AST 12* 18 11* 14* 12*  ALT 16 13 11 14 10   ALKPHOS 68 73 64 69 71  BILITOT 0.8 1.0 0.7 0.5 0.5  PROT 5.6* 5.5* 5.3* 5.5* 5.8*  ALBUMIN 2.1* 2.1* 2.1* 2.0* 2.0*   No results for input(s): "LIPASE", "AMYLASE" in the last 168 hours. No results for input(s): "AMMONIA" in the last 168 hours. Coagulation Profile: Recent Labs  Lab 01/08/23 0346  INR 1.1   Cardiac Enzymes: No results for input(s): "CKTOTAL", "CKMB", "CKMBINDEX", "TROPONINI" in the last 168 hours. BNP (last 3 results) No results for input(s): "PROBNP" in the last 8760 hours. HbA1C: No results for input(s): "HGBA1C" in the last 72 hours. CBG: Recent Labs  Lab 01/07/23 2034 01/07/23 2352 01/08/23 0520 01/08/23 0752 01/08/23 1245  GLUCAP 96 93 110* 106* 91   Lipid Profile: No results for input(s): "CHOL", "HDL", "LDLCALC", "TRIG", "CHOLHDL", "LDLDIRECT" in the last 72 hours. Thyroid Function Tests: No results for input(s): "TSH", "T4TOTAL", "FREET4", "T3FREE", "THYROIDAB" in the last 72 hours. Anemia Panel: Recent Labs    01/06/23 0720  VITAMINB12 633  FOLATE 8.3  FERRITIN 530*  TIBC 192*  IRON 26*  RETICCTPCT 1.2   Sepsis Labs: Recent Labs  Lab 01/04/23 0445  PROCALCITON 3.33  LATICACIDVEN 0.8   Recent Results (from the past 240 hour(s))  Culture, blood (Routine X 2) w Reflex to ID Panel     Status: None (Preliminary result)   Collection Time: 01/04/23 11:29 AM   Specimen: BLOOD RIGHT HAND  Result Value Ref Range Status   Specimen Description   Final    BLOOD RIGHT HAND Performed at [facility], [address] [place], [place] [address]    Special  Requests   Final    BOTTLES DRAWN AEROBIC AND ANAEROBIC Blood Culture adequate volume Performed at [facility], [address], [place], [place] [address]    Culture   Final    NO GROWTH 4 DAYS Performed at [facility], [address] [place], [place] [address]    Report Status PENDING  Incomplete  Culture, blood (Routine X 2) w Reflex to ID Panel     Status: None (Preliminary result)   Collection Time: 01/04/23 11:29 AM   Specimen: BLOOD RIGHT ARM  Result Value Ref Range Status   Specimen Description   Final    BLOOD RIGHT ARM Performed at [facility], [address], [place], [place] [address]    Special Requests   Final    BOTTLES DRAWN AEROBIC AND ANAEROBIC Blood Culture adequate volume Performed at [facility], [address], [place], [place] [address]    Culture   Final    NO GROWTH 4 DAYS Performed at [facility], [address] [place], [place] [address]    Report Status PENDING  Incomplete  Gastrointestinal Panel by PCR , Stool     Status: None   Collection Time: 01/06/23  4:38 PM   Specimen: Stool  Result Value Ref Range Status   Campylobacter species NOT DETECTED NOT DETECTED Final   Plesimonas shigelloides NOT DETECTED NOT DETECTED Final   Salmonella species NOT DETECTED NOT DETECTED Final   Yersinia enterocolitica NOT DETECTED NOT DETECTED Final   Vibrio species NOT DETECTED NOT DETECTED  Final   Vibrio cholerae NOT DETECTED NOT DETECTED Final   Enteroaggregative E coli (EAEC) NOT DETECTED NOT DETECTED Final   Enteropathogenic E coli (EPEC) NOT DETECTED NOT DETECTED Final   Enterotoxigenic E coli (ETEC) NOT DETECTED NOT DETECTED Final   Shiga like toxin producing E coli (STEC) NOT DETECTED NOT DETECTED Final   Shigella/Enteroinvasive E coli (EIEC) NOT DETECTED NOT DETECTED Final   Cryptosporidium NOT DETECTED NOT DETECTED Final   Cyclospora cayetanensis NOT DETECTED NOT DETECTED Final   Entamoeba  histolytica NOT DETECTED NOT DETECTED Final   Giardia lamblia NOT DETECTED NOT DETECTED Final   Adenovirus F40/41 NOT DETECTED NOT DETECTED Final   Astrovirus NOT DETECTED NOT DETECTED Final   Norovirus GI/GII NOT DETECTED NOT DETECTED Final   Rotavirus A NOT DETECTED NOT DETECTED Final   Sapovirus (I, II, IV, and V) NOT DETECTED NOT DETECTED Final    Comment: Performed at [facility], [address], [place], [place] [address]    Radiology Studies: CT GUIDED VISCERAL FLUID DRAIN BY [facility] CATH  Result Date: 01/08/2023 INDICATION: 37 year old male with suspected fluid collection between the stomach and left lobe of the liver in the setting of a bile leak. He presents for attempted imaging guided drain placement. EXAM: None combined ultrasound and CT guided placement of percutaneous drainage catheter MEDICATIONS: The patient is currently admitted to the hospital and receiving intravenous antibiotics. The antibiotics were administered within an appropriate time frame prior to the initiation of the procedure. ANESTHESIA/SEDATION: Moderate (conscious) sedation was employed during this procedure. A total of Versed 2 mg and Fentanyl 200 mcg was administered intravenously by the radiology nurse. Total intra-service moderate Sedation Time: 23 minutes. The patient's level of consciousness and vital signs were monitored continuously by radiology nursing throughout the procedure under my direct supervision. COMPLICATIONS: None immediate. PROCEDURE: Informed written consent was obtained from the patient after a thorough discussion of the procedural risks, benefits and alternatives. All questions were addressed. Maximal Sterile Barrier Technique was utilized including caps, mask, sterile gowns, sterile gloves, sterile drape, hand hygiene and skin antiseptic. A timeout was performed prior to the initiation of the procedure. Ultrasound was used to interrogate the upper abdomen. The stomach and liver were  identified. The fluid collection between the 2 was successfully identified. A suitable skin entry site was selected and marked. Local anesthesia was attained by infiltration with 1% lidocaine. A small dermatotomy was made. Under real-time ultrasound guidance, a 15 cm 18 gauge trocar needle was carefully advanced into the fluid collection. There was return of bilious fluid. A 0.035 wire was then coiled in the collection. The wire was removed. The percutaneous tract was dilated to 10 [place]. A Cook 10 [place] all-purpose drain was advanced over the wire and formed. There was return of cloudy bilious fluid. A sample was sent for Gram stain and culture. The patient was then carefully repositioned to facilitate passage of the abdomen into the CT scanner. A limited CT scan was performed confirming excellent positioning of the drain. No evidence of immediate complication. The drain was connected to JP bulb suction and secured to the skin with 0 Prolene suture. IMPRESSION: Successful placement of 10 French percutaneous drainage catheter. Electronically Signed   By: Malachy Moan M.D.   On: 01/08/2023 13:00    Scheduled Meds:  allopurinol  100 mg Oral Daily   Chlorhexidine Gluconate Cloth  6 each Topical Daily   colchicine  0.6 mg Oral Daily   gabapentin  300 mg Oral  QID   Gerhardt's butt cream   Topical BID   guaiFENesin  1,200 mg Oral BID   heparin injection (subcutaneous)  5,000 Units Subcutaneous Q8H   hydrocortisone  1 Application Rectal BID   insulin aspart  0-20 Units Subcutaneous Q4H   lidocaine  1 patch Transdermal Daily   nystatin   Topical BID   pantoprazole (PROTONIX) IV  40 mg Intravenous Q12H   Ensure Max Protein  11 oz Oral Daily   sodium chloride flush  5 mL Intracatheter Q8H   Continuous Infusions:  sodium chloride Stopped (12/29/22 0059)   sodium chloride Stopped (01/03/23 1522)   ceFEPime (MAXIPIME) IV 2 g (01/08/23 1416)   methocarbamol (ROBAXIN) IV Stopped (01/07/23 2118)    metronidazole 500 mg (01/08/23 1034)   ondansetron (ZOFRAN) IV      LOS: 17 days   Marguerita Merles, DO [facility] Available via Epic secure chat 7am-7pm After these hours, please refer to coverage provider listed on [email or web address] 01/08/2023, 2:57 PM

## 2023-01-08 NOTE — Progress Notes (Signed)
Pt continues to refuse BIPAP at this time.

## 2023-01-08 NOTE — Progress Notes (Signed)
Assumed care of pt. From off going RN no changes in initial am assessment at this time. Cont with plan of care.

## 2023-01-09 DIAGNOSIS — J9601 Acute respiratory failure with hypoxia: Secondary | ICD-10-CM | POA: Diagnosis not present

## 2023-01-09 DIAGNOSIS — N179 Acute kidney failure, unspecified: Secondary | ICD-10-CM | POA: Diagnosis not present

## 2023-01-09 DIAGNOSIS — Z7401 Bed confinement status: Secondary | ICD-10-CM | POA: Diagnosis not present

## 2023-01-09 DIAGNOSIS — K8010 Calculus of gallbladder with chronic cholecystitis without obstruction: Secondary | ICD-10-CM | POA: Diagnosis not present

## 2023-01-09 LAB — COMPREHENSIVE METABOLIC PANEL
ALT: 11 U/L (ref 0–44)
AST: 11 U/L — ABNORMAL LOW (ref 15–41)
Albumin: 2.1 g/dL — ABNORMAL LOW (ref 3.5–5.0)
Alkaline Phosphatase: 64 U/L (ref 38–126)
Anion gap: 10 (ref 5–15)
BUN: 7 mg/dL (ref 6–20)
CO2: 27 mmol/L (ref 22–32)
Calcium: 7.7 mg/dL — ABNORMAL LOW (ref 8.9–10.3)
Chloride: 102 mmol/L (ref 98–111)
Creatinine, Ser: 0.39 mg/dL — ABNORMAL LOW (ref 0.61–1.24)
GFR, Estimated: >60 mL/min (ref >60)
Glucose, Bld: 120 mg/dL — ABNORMAL HIGH (ref 70–99)
Potassium: 3.4 mmol/L — ABNORMAL LOW (ref 3.5–5.1)
Sodium: 139 mmol/L (ref 135–145)
Total Bilirubin: 0.5 mg/dL (ref 0.3–1.2)
Total Protein: 5.7 g/dL — ABNORMAL LOW (ref 6.5–8.1)

## 2023-01-09 LAB — CBC WITH DIFFERENTIAL/PLATELET
Abs Immature Granulocytes: 0.11 10*3/uL — ABNORMAL HIGH (ref 0.00–0.07)
Basophils Absolute: 0 10*3/uL (ref 0.0–0.1)
Basophils Relative: 0 %
Eosinophils Absolute: 0.4 10*3/uL (ref 0.0–0.5)
Eosinophils Relative: 4 %
HCT: 36.8 % — ABNORMAL LOW (ref 39.0–52.0)
Hemoglobin: 11.1 g/dL — ABNORMAL LOW (ref 13.0–17.0)
Immature Granulocytes: 1 %
Lymphocytes Relative: 16 %
Lymphs Abs: 1.8 10*3/uL (ref 0.7–4.0)
MCH: 28.3 pg (ref 26.0–34.0)
MCHC: 30.2 g/dL (ref 30.0–36.0)
MCV: 93.9 fL (ref 80.0–100.0)
Monocytes Absolute: 1.2 10*3/uL — ABNORMAL HIGH (ref 0.1–1.0)
Monocytes Relative: 10 %
Neutro Abs: 7.8 10*3/uL — ABNORMAL HIGH (ref 1.7–7.7)
Neutrophils Relative %: 69 %
Platelets: 277 10*3/uL (ref 150–400)
RBC: 3.92 MIL/uL — ABNORMAL LOW (ref 4.22–5.81)
RDW: 14.1 % (ref 11.5–15.5)
WBC: 11.3 10*3/uL — ABNORMAL HIGH (ref 4.0–10.5)
nRBC: 0 % (ref 0.0–0.2)

## 2023-01-09 LAB — GLUCOSE, CAPILLARY
Glucose-Capillary: 109 mg/dL — ABNORMAL HIGH (ref 70–99)
Glucose-Capillary: 115 mg/dL — ABNORMAL HIGH (ref 70–99)
Glucose-Capillary: 116 mg/dL — ABNORMAL HIGH (ref 70–99)
Glucose-Capillary: 119 mg/dL — ABNORMAL HIGH (ref 70–99)
Glucose-Capillary: 135 mg/dL — ABNORMAL HIGH (ref 70–99)
Glucose-Capillary: 90 mg/dL (ref 70–99)

## 2023-01-09 LAB — CULTURE, BLOOD (ROUTINE X 2)
Culture: NO GROWTH
Culture: NO GROWTH
Special Requests: ADEQUATE
Special Requests: ADEQUATE

## 2023-01-09 LAB — MAGNESIUM: Magnesium: 1.7 mg/dL (ref 1.7–2.4)

## 2023-01-09 LAB — PHOSPHORUS: Phosphorus: 3.3 mg/dL (ref 2.5–4.6)

## 2023-01-09 MED ORDER — MAGNESIUM SULFATE 2 GM/50ML IV SOLN
2.0000 g | Freq: Once | INTRAVENOUS | Status: AC
  Administered 2023-01-09: 2 g via INTRAVENOUS
  Filled 2023-01-09: qty 50

## 2023-01-09 MED ORDER — LINEZOLID 600 MG/300ML IV SOLN
600.0000 mg | Freq: Two times a day (BID) | INTRAVENOUS | Status: DC
  Administered 2023-01-09 – 2023-01-12 (×7): 600 mg via INTRAVENOUS
  Filled 2023-01-09 (×7): qty 300

## 2023-01-09 MED ORDER — OXYCODONE HCL 5 MG PO TABS
10.0000 mg | ORAL_TABLET | ORAL | Status: DC | PRN
  Administered 2023-01-09 – 2023-01-10 (×3): 15 mg via ORAL
  Filled 2023-01-09 (×3): qty 3

## 2023-01-09 MED ORDER — POTASSIUM CHLORIDE CRYS ER 20 MEQ PO TBCR
40.0000 meq | EXTENDED_RELEASE_TABLET | Freq: Two times a day (BID) | ORAL | Status: AC
  Administered 2023-01-09 (×2): 40 meq via ORAL
  Filled 2023-01-09 (×2): qty 2

## 2023-01-09 NOTE — Progress Notes (Signed)
PROGRESS NOTE    [name]  ZOX:[id number] DOB: 1985/05/21 DOA: 12/21/2022 PCP: Toma Deiters, MD   Brief Narrative:  The Patient [name] is a 37 y/o male with obesity, NASH who presented with acute cholecystitis requiring cholecystectomy, which was complicated by his hepatomegaly. He had a bile leak post-op requiring a drain  Significant Hospital Events:    9/5 - Underwent elective lap chole with CCS. Partial cholecystectomy completed in the setting of poor visualization. EBL . Drain left in place. 9/12 - Transferred to SDU for worsening hypoxia, tachypnea, tachycardia. Hypotensive. CT Chest/A/P with postoperative changes, surgical drain in gallbladder fossa, minimal L basilar subsegmental atelectasis, L adrenal adenoma. TRH consulted. Fluids/Zosyn given, broadened to vanc/cefepime/Flagyl. Refused BiPAP/stated he would refuse intubation even if it meant death. Made DNR. Later in shift more hypotensive, PCCM consulted for pressors. Code status changed back to full code. Vasopressors initiated. 9/13 - A-line, LIJ CVC placed. High pressor requirements with NE 38, Neo 70, vaso 0.03. 9/16: neo stopped 9/13. NE and vaso stopped 9/15. Duoneb for wheezing. A-line removed. Transfer to SDU.  9/17: CCM requested TRH to continue to follow for medical management.  Surgery remains primary service.  9/18 -Had some Transient Bradycardia and ? Pause; will replete electrolytes and continue to monitor.  Patient's renal function is improved  9/19 -Spiked a temperature yesterday and so we will obtain blood cultures x 2 and workup with fever given that WBC slightly worsened to 17.7.   9/20 - WBC still about the same. Repeat CT Scan pending. Patient states he did not sleep very well.   9/21 -having quite a bit of diarrhea refuses for his rectal tube to be reinserted.  Appears frustrated and wanting to leave.    01/07/2023-CT scan shows new 9.7 cm fluid collection in interventional radiology  has not been consulted.  Will continue cefepime and Flagyl and intramedullary is recommending for aspiration possible drain placement on 01/08/2023  01/08/2023 -plan is for interventional radiology to aspirate the new intra-abdominal fluid collection versus placing another drain.  Continues to have diarrhea and abdominal discomfort.  01/09/2023 -had drain placement yesterday and continues to complain some abdominal discomfort.  States his diarrhea is slowly improving and did not have a bowel movement yet today.  His biggest issue is pain control now and his tolerance of food  Assessment and Plan:  Chronic Calculous Cholecystitis, sepsis from biliary source, improving slowly Biliary leak, post operatively.  New 9.7 cm fluid collection in the gastrohepatic ligament status post drain placement -Currently off vasopressors, sepsis physiology improving but still spiking intermittent temp, BP now elevated -WBC Trend: Recent Labs  Lab 01/02/23 0341 01/03/23 0325 01/04/23 0445 01/05/23 0419 01/06/23 0720 01/07/23 0742 01/08/23 0346  WBC 20.4* 15.9* 17.7* 17.5* 15.8* 16.4* 17.6*  -On broad-spectrum antibiotics and will continue IV cefepime and IV Flagyl, management per general surgery -Had some intermittent temperatures and WBC remained elevated prompting a repeat CT scan -Repeat CXR done and showed "Increasing streaky bibasilar opacities, which may represent atelectasis or infection. Cardiomegaly with pulmonary vascular congestion." -U/A done and showed a hazy appearance with small hemoglobin, large leukocytes, negative nitrites, rare bacteria, 11-20 RBC per high-power field and 21-50 WBCs -Repeat CT Scan of the Abdomen and Pelvis showed a new intra-abdominal fluid collection that measured about 9.7 cm in the gastrohepatic ligament which was attributed to be either a biloma or abscess as well as small amount of free fluid in the right paracolic gutter and bilateral small pleural  effusions and  atelectasis; there is concern about a bile leak and the radiologist recommended nuclear medicine hepatobiliary scan however Biliary drain in place and general surgery feels that he does not need a HIDA scan given his suspected bile leak given other drain output and partial cholecystectomy -He is status post drain placement now of the new intra-abdominal fluid collection -Procalcitonin level was 3.33 however on admission it was greater than 150; repeat in the a.m. -Blood cultures 1/2 staph epi, currently on Cefepime and Flagyl; blood cultures x 2 being reobtained and repeat blood culture showing no growth to date at 2 days -GI had evaluated him this admission has signed off the case and further care per general surgery and pain control -Continue pain management per general surgery  Acute on Chronic Respiratory Failure with Hypoxia -Multifactorial secondary to aggressive fluid resuscitation during shock, OSA, OHS - BiPAP as needed, received IV Lasix 40 mg x 1 on 9/16, 9/17, 9/19 (per PCCM) for fluid overload and currently he is +163.4 -Holding off giving him a dose of IV Lasix today SpO2: 94 % O2 Flow Rate (L/min): 2 L/min FiO2 (%): 35 % -Continuous Pulse Oximetry and maintain O2 saturation greater than 90% -Continue supplemental oxygen via nasal cannula and wean oxygen to room air -Repeat CXR done 01/04/2023 showed increasing bilateral basilar opacities either representing atelectasis or infection and also showed cardiomegaly with pulmonary vascular congestion  -Initiate Flutter Valve, Incentive Spirometry, Guaifenesin 1200 po BID -Continue to monitor oxygen requirements as necessary   Electrolyte Abnormalities including Hypokalemia and Hypophosphatemia and Hypomagnesemia -K, Mag, and Phos Level Trend: Recent Labs  Lab 01/02/23 0341 01/03/23 0324 01/03/23 0325 01/04/23 0445 01/05/23 0419 01/06/23 0720 01/07/23 0511 01/08/23 0346  K 3.5  --  3.0* 3.2* 3.7 3.6 4.2 3.6  MG  --    < >  --   1.5* 1.7 1.7 1.5* 1.9  PHOS  --    < >  --  1.9* 2.6 2.9 3.6 3.4   < > = values in this interval not displayed.  -Conitnue To monitor replete as necessary repeat CMP in a.m. along with Phos and mag  OHS/OSA -Largely related to habitus, morbid obesity -BiPAP as above  AKI, improved and resolved  -In the setting of hypotension, sepsis.  Likely ATN and combination of obstructive physiology, unable to pass urine prior to Foley placement.   -Status post Foley placement with a cystoscopy 9/14 by Urology -BUN/Cr was 8/0.54 on 01/09/23 -Avoid Nephrotoxic Medications, Contrast Dyes, Hypotension and Dehydration to Ensure Adequate Renal Perfusion and will need to Renally Adjust Meds -Follow Renal Fxn Closely with Diuresis and will hold off on giving him another dose of Lasix today -Continue to Monitor and Trend Renal Function carefully and repeat CMP in the AM   Normocytic Anemia -Hgb/Hct Trend relatively stable with last hemoglobin/hematocrit being 11.3/36.9: -Anemia Panel showed an iron level of 26, UIBC of 166, TIBC 192, saturation ratios of 14%, ferritin level 530, folate level of 8.3, vitamin B12 633 -Continue to monitor for signs and symptoms bleeding; no overt bleeding noted -Repeat CBC in the AM  Hypoalbuminemia -Patient's Albumin Trend: Recent Labs  Lab 01/01/23 0500 01/02/23 0341 01/04/23 0445 01/05/23 0419 01/06/23 0720 01/07/23 0511 01/08/23 0346  ALBUMIN 2.3* 2.3* 2.1* 2.1* 2.1* 2.0* 2.0*  -Continue to Monitor and Trend and repeat CMP in the AM  Super Morbid Obesity -Complicates overall prognosis and care -Estimated body mass index is 81.14 kg/m as calculated from the following:  Height as of this encounter: 5\' 7"  (1.702 m).   Weight as of this encounter: 235 kg.  -Weight Loss and Dietary Counseling given   DVT prophylaxis: heparin injection 5,000 Units Start: 01/08/23 2200 SCD's Start: 12/21/22 1116    Code Status: Full Code Family Communication: No family  currently at bedside  Disposition Plan:  Level of care: Progressive Status is: Inpatient Remains inpatient appropriate because: Needs further clinical improvement and tolerance of diet and pain adequately controlled with improvement in his infectious markers   Consultants:  General surgery Gastroenterology PCCM TRH  Procedures:  As delineated as above  Antimicrobials:  Anti-infectives (From admission, onward)    Start     Dose/Rate Route Frequency Ordered Stop   01/09/23 1315  linezolid (ZYVOX) IVPB 600 mg        600 mg 300 mL/hr over 60 Minutes Intravenous Every 12 hours 01/09/23 1219     01/01/23 2000  ceFEPIme (MAXIPIME) 2 g in sodium chloride 0.9 % 100 mL IVPB        2 g 200 mL/hr over 30 Minutes Intravenous Every 8 hours 01/01/23 1311     12/31/22 1245  linezolid (ZYVOX) IVPB 600 mg  Status:  Discontinued        600 mg 300 mL/hr over 60 Minutes Intravenous 2 times daily 12/31/22 1145 01/02/23 1146   12/29/22 2200  ceFEPIme (MAXIPIME) 2 g in sodium chloride 0.9 % 100 mL IVPB  Status:  Discontinued        2 g 200 mL/hr over 30 Minutes Intravenous Every 12 hours 12/28/22 2127 12/29/22 1401   12/29/22 1500  ceFEPIme (MAXIPIME) 2 g in sodium chloride 0.9 % 100 mL IVPB  Status:  Discontinued        2 g 200 mL/hr over 30 Minutes Intravenous 2 times daily 12/29/22 1401 01/01/23 1311   12/29/22 1215  linezolid (ZYVOX) IVPB 600 mg  Status:  Discontinued        600 mg 300 mL/hr over 60 Minutes Intravenous Every 12 hours 12/29/22 1120 12/31/22 1145   12/29/22 0400  vancomycin (VANCOREADY) IVPB 1250 mg/250 mL  Status:  Discontinued        1,250 mg 166.7 mL/hr over 90 Minutes Intravenous Every 12 hours 12/28/22 1512 12/28/22 2127   12/28/22 2126  vancomycin variable dose per unstable renal function (pharmacist dosing)  Status:  Discontinued         Does not apply See admin instructions 12/28/22 2127 12/29/22 1120   12/28/22 2100  ceFEPIme (MAXIPIME) 2 g in sodium chloride 0.9 % 100  mL IVPB  Status:  Discontinued        2 g 200 mL/hr over 30 Minutes Intravenous Every 8 hours 12/28/22 1512 12/28/22 2127   12/28/22 2100  metroNIDAZOLE (FLAGYL) IVPB 500 mg        500 mg 100 mL/hr over 60 Minutes Intravenous Every 12 hours 12/28/22 1512     12/28/22 1600  vancomycin (VANCOREADY) IVPB 2000 mg/400 mL        2,000 mg 200 mL/hr over 120 Minutes Intravenous  Once 12/28/22 1512 12/28/22 1752   12/28/22 1230  piperacillin-tazobactam (ZOSYN) IVPB 3.375 g  Status:  Discontinued        3.375 g 12.5 mL/hr over 240 Minutes Intravenous Every 8 hours 12/28/22 1116 12/28/22 1503   12/21/22 0707  ceFAZolin (ANCEF) 3-0.9 GM/100ML-% IVPB       Note to Pharmacy: Blair Promise B: cabinet override      12/21/22  1610 12/21/22 1914   12/21/22 0600  ceFAZolin (ANCEF) IVPB 2g/100 mL premix  Status:  Discontinued        2 g 200 mL/hr over 30 Minutes Intravenous On call to O.R. 12/21/22 0541 12/21/22 1058       Subjective: Seen and examined at bedside and still complaining about significant pain and states that he is continue to have diarrhea but none this morning.  Not eating very much as well.  States the pain medication does not last very long and not really helping.  Has another drain on the left side.  No other concerns or complaints at this time.  Objective: Vitals:   01/08/23 1250 01/08/23 2108 01/09/23 0440 01/09/23 1115  BP: 123/71 (!) 109/59 114/63 123/64  Pulse: 92 92 90 99  Resp: 18 18 18 20   Temp: 98.2 F (36.8 C) 99.1 F (37.3 C) 98.9 F (37.2 C) 98.6 F (37 C)  TempSrc: Oral Oral Oral Oral  SpO2: 94% 95% 93% 92%  Weight:      Height:        Intake/Output Summary (Last 24 hours) at 01/09/2023 1541 Last data filed at 01/09/2023 1515 Gross per 24 hour  Intake 1800.03 ml  Output 1744 ml  Net 56.03 ml   Filed Weights   12/21/22 0543  Weight: (!) 235 kg   Examination: Physical Exam:  Constitutional: WN/WD super morbidly obese Caucasian male who continues to  appear frustrated and uncomfortable Respiratory: Diminished to auscultation bilaterally, no wheezing, rales, rhonchi or crackles. Normal respiratory effort and patient is not tachypenic. No accessory muscle use.  Unlabored breathing Cardiovascular: RRR, no murmurs / rubs / gallops. S1 and S2 auscultated.  1+ extremity edema. .  Abdomen: Soft, non-tender, distended secondary to body habitus.  Has a biliary drain as well as another abdominal drain on the left side. Bowel sounds positive.  GU: Deferred. Musculoskeletal: No clubbing / cyanosis of digits/nails. No joint deformity upper and lower extremities.  Skin: No rashes, lesions, ulcers. No induration; Warm and dry.  Neurologic: CN 2-12 grossly intact with no focal deficits. Romberg sign and cerebellar reflexes not assessed.  Psychiatric: Normal judgment and insight. Alert and oriented x 3.  Little anxious  Data Reviewed: I have personally reviewed following labs and imaging studies  CBC: Recent Labs  Lab 01/05/23 0419 01/06/23 0720 01/07/23 0742 01/08/23 0346 01/09/23 0408  WBC 17.5* 15.8* 16.4* 17.6* 11.3*  NEUTROABS 11.5* 11.4* 12.2* 13.8* 7.8*  HGB 11.4* 11.0* 10.8* 11.3* 11.1*  HCT 35.8* 35.9* 35.3* 36.9* 36.8*  MCV 89.7 92.1 91.0 92.3 93.9  PLT 220 240 269 273 277   Basic Metabolic Panel: Recent Labs  Lab 01/05/23 0419 01/06/23 0720 01/07/23 0511 01/08/23 0346 01/09/23 0408  NA 134* 137 138 137 139  K 3.7 3.6 4.2 3.6 3.4*  CL 97* 100 102 100 102  CO2 29 28 26 29 27   GLUCOSE 113* 113* 115* 115* 120*  BUN 16 11 8 8 7   CREATININE 0.55* 0.38* 0.44* 0.54* 0.39*  CALCIUM 7.4* 7.7* 7.5* 7.7* 7.7*  MG 1.7 1.7 1.5* 1.9 1.7  PHOS 2.6 2.9 3.6 3.4 3.3   GFR: Estimated Creatinine Clearance: 239.1 mL/min (A) (by C-G formula based on SCr of 0.39 mg/dL (L)). Liver Function Tests: Recent Labs  Lab 01/05/23 0419 01/06/23 0720 01/07/23 0511 01/08/23 0346 01/09/23 0408  AST 18 11* 14* 12* 11*  ALT 13 11 14 10 11   ALKPHOS  73 64 69 71 64  BILITOT 1.0  0.7 0.5 0.5 0.5  PROT 5.5* 5.3* 5.5* 5.8* 5.7*  ALBUMIN 2.1* 2.1* 2.0* 2.0* 2.1*   No results for input(s): "LIPASE", "AMYLASE" in the last 168 hours. No results for input(s): "AMMONIA" in the last 168 hours. Coagulation Profile: Recent Labs  Lab 01/08/23 0346  INR 1.1   Cardiac Enzymes: No results for input(s): "CKTOTAL", "CKMB", "CKMBINDEX", "TROPONINI" in the last 168 hours. BNP (last 3 results) No results for input(s): "PROBNP" in the last 8760 hours. HbA1C: No results for input(s): "HGBA1C" in the last 72 hours. CBG: Recent Labs  Lab 01/08/23 2102 01/09/23 0001 01/09/23 0437 01/09/23 0757 01/09/23 1141  GLUCAP 153* 135* 116* 109* 119*   Lipid Profile: No results for input(s): "CHOL", "HDL", "LDLCALC", "TRIG", "CHOLHDL", "LDLDIRECT" in the last 72 hours. Thyroid Function Tests: No results for input(s): "TSH", "T4TOTAL", "FREET4", "T3FREE", "THYROIDAB" in the last 72 hours. Anemia Panel: No results for input(s): "VITAMINB12", "FOLATE", "FERRITIN", "TIBC", "IRON", "RETICCTPCT" in the last 72 hours. Sepsis Labs: Recent Labs  Lab 01/04/23 0445  PROCALCITON 3.33  LATICACIDVEN 0.8   Recent Results (from the past 240 hour(s))  Culture, blood (Routine X 2) w Reflex to ID Panel     Status: None   Collection Time: 01/04/23 11:29 AM   Specimen: BLOOD RIGHT HAND  Result Value Ref Range Status   Specimen Description   Final    BLOOD RIGHT HAND Performed at [facility], [address] [place], [place] [address]    Special Requests   Final    BOTTLES DRAWN AEROBIC AND ANAEROBIC Blood Culture adequate volume Performed at [facility], [address], [place], [place] [address]    Culture   Final    NO GROWTH 5 DAYS Performed at [facility], [address] [place], [place] [address]    Report Status 01/09/2023 FINAL  Final  Culture, blood (Routine X 2) w Reflex to ID Panel     Status: None   Collection  Time: 01/04/23 11:29 AM   Specimen: BLOOD RIGHT ARM  Result Value Ref Range Status   Specimen Description   Final    BLOOD RIGHT ARM Performed at [facility], [address] [place], [place] [address]    Special Requests   Final    BOTTLES DRAWN AEROBIC AND ANAEROBIC Blood Culture adequate volume Performed at [facility], [address] [place] [address], [place], [place] [address]    Culture   Final    NO GROWTH 5 DAYS Performed at [facility], [address] [place], [place] [address]    Report Status 01/09/2023 FINAL  Final  Gastrointestinal Panel by PCR , Stool     Status: None   Collection Time: 01/06/23  4:38 PM   Specimen: Stool  Result Value Ref Range Status   Campylobacter species NOT DETECTED NOT DETECTED Final   Plesimonas shigelloides NOT DETECTED NOT DETECTED Final   Salmonella species NOT DETECTED NOT DETECTED Final   Yersinia enterocolitica NOT DETECTED NOT DETECTED Final   Vibrio species NOT DETECTED NOT DETECTED Final   Vibrio cholerae NOT DETECTED NOT DETECTED Final   Enteroaggregative E coli (EAEC) NOT DETECTED NOT DETECTED Final   Enteropathogenic E coli (EPEC) NOT DETECTED NOT DETECTED Final   Enterotoxigenic E coli (ETEC) NOT DETECTED NOT DETECTED Final   Shiga like toxin producing E coli (STEC) NOT DETECTED NOT DETECTED Final   Shigella/Enteroinvasive E coli (EIEC) NOT DETECTED NOT DETECTED Final  Cryptosporidium NOT DETECTED NOT DETECTED Final   Cyclospora cayetanensis NOT DETECTED NOT DETECTED Final   Entamoeba histolytica NOT DETECTED NOT DETECTED Final   Giardia lamblia NOT DETECTED NOT DETECTED Final   Adenovirus F40/41 NOT DETECTED NOT DETECTED Final   Astrovirus NOT DETECTED NOT DETECTED Final   Norovirus GI/GII NOT DETECTED NOT DETECTED Final   Rotavirus A NOT DETECTED NOT DETECTED Final   Sapovirus (I, II, IV, and V) NOT DETECTED NOT DETECTED Final    Comment: Performed at [facility], [address]  [place], [place] [address]  Aerobic/Anaerobic Culture w Gram Stain (surgical/deep wound)     Status: None (Preliminary result)   Collection Time: 01/08/23 12:03 PM   Specimen: BILE  Result Value Ref Range Status   Specimen Description   Final    BILE Performed at [facility], [address], [place], [place] [address]    Special Requests   Final    WOUND Performed at [facility], [address] [place], [place] [address]    Gram Stain   Final    ABUNDANT WBC PRESENT, PREDOMINANTLY PMN RARE GRAM POSITIVE COCCI IN CLUSTERS    Culture   Final    FEW STAPHYLOCOCCUS AUREUS SUSCEPTIBILITIES TO FOLLOW Performed at [facility], [address] [place], [place] [address]    Report Status PENDING  Incomplete    Radiology Studies: CT GUIDED PERITONEAL/RETROPERITONEAL FLUID DRAIN BY PERC CATH  Result Date: 01/08/2023 INDICATION: 37 year old male with suspected fluid collection between the stomach and left lobe of the liver in the setting of a bile leak. He presents for attempted imaging guided drain placement. EXAM: None combined ultrasound and CT guided placement of percutaneous drainage catheter MEDICATIONS: The patient is currently admitted to the hospital and receiving intravenous antibiotics. The antibiotics were administered within an appropriate time frame prior to the initiation of the procedure. ANESTHESIA/SEDATION: Moderate (conscious) sedation was employed during this procedure. A total of Versed 2 mg and Fentanyl 200 mcg was administered intravenously by the radiology nurse. Total intra-service moderate Sedation Time: 23 minutes. The patient's level of consciousness and vital signs were monitored continuously by radiology nursing throughout the procedure under my direct supervision. COMPLICATIONS: None immediate. PROCEDURE: Informed written consent was obtained from the patient after a thorough discussion of the procedural risks, benefits and  alternatives. All questions were addressed. Maximal Sterile Barrier Technique was utilized including caps, mask, sterile gowns, sterile gloves, sterile drape, hand hygiene and skin antiseptic. A timeout was performed prior to the initiation of the procedure. Ultrasound was used to interrogate the upper abdomen. The stomach and liver were identified. The fluid collection between the 2 was successfully identified. A suitable skin entry site was selected and marked. Local anesthesia was attained by infiltration with 1% lidocaine. A small dermatotomy was made. Under real-time ultrasound guidance, a 15 cm 18 gauge trocar needle was carefully advanced into the fluid collection. There was return of bilious fluid. A 0.035 wire was then coiled in the collection. The wire was removed. The percutaneous tract was dilated to 10 [place]. A Cook 10 [place] all-purpose drain was advanced over the wire and formed. There was return of cloudy bilious fluid. A sample was sent for Gram stain and culture. The patient was then carefully repositioned to facilitate passage of the abdomen into the CT scanner. A limited CT scan was performed confirming excellent positioning of the drain. No evidence of immediate complication. The drain was connected to JP  bulb suction and secured to the skin with 0 Prolene suture. IMPRESSION: Successful placement of 10 French percutaneous drainage catheter. Electronically Signed   By: Malachy Moan M.D.   On: 01/08/2023 13:00    Scheduled Meds:  allopurinol  100 mg Oral Daily   Chlorhexidine Gluconate Cloth  6 each Topical Daily   colchicine  0.6 mg Oral Daily   gabapentin  300 mg Oral QID   Gerhardt's butt cream   Topical BID   guaiFENesin  1,200 mg Oral BID   heparin injection (subcutaneous)  5,000 Units Subcutaneous Q8H   hydrocortisone  1 Application Rectal BID   insulin aspart  0-20 Units Subcutaneous Q4H   lidocaine  1 patch Transdermal Daily   nystatin   Topical BID   pantoprazole  (PROTONIX) IV  40 mg Intravenous Q12H   potassium chloride  40 mEq Oral BID   Ensure Max Protein  11 oz Oral Daily   sodium chloride flush  5 mL Intracatheter Q8H   Continuous Infusions:  sodium chloride Stopped (12/29/22 0059)   sodium chloride Stopped (01/03/23 1522)   ceFEPime (MAXIPIME) IV Stopped (01/09/23 1329)   linezolid (ZYVOX) IV Stopped (01/09/23 1439)   methocarbamol (ROBAXIN) IV Stopped (01/08/23 2323)   metronidazole Stopped (01/09/23 0931)   ondansetron (ZOFRAN) IV      LOS: 18 days   Marguerita Merles, DO [facility] Available via Epic secure chat 7am-7pm After these hours, please refer to coverage provider listed on [email or web address] 01/09/2023, 3:41 PM

## 2023-01-09 NOTE — Progress Notes (Signed)
OT Cancellation Note  Patient Details Name: [name] MRN: [id number] DOB: July 09, 1985   Cancelled Treatment:    Reason Eval/Treat Not Completed: Patient declined, no reason specified Patient declined to participate in session reporting pain was not under control. Patient reported that he does not want to do therapy while he is here but when he gets back to LTC when his pain is managed. OT signing off per patient wishes at this time. Rosalio Loud, MS Acute Rehabilitation Department Office# [phone]  01/09/2023, 11:41 AM

## 2023-01-09 NOTE — Plan of Care (Signed)
Monitor labs and vital signs. Monitor intake and output. Monitor jp drain output amounts and color. Medicate as needed for complaint of pain or discomfort.  Dressing changes as ordered. Continue current plan of care.

## 2023-01-09 NOTE — Progress Notes (Signed)
   01/09/23 1906  BiPAP/CPAP/SIPAP  BiPAP/CPAP/SIPAP Pt Type Adult  Reason BIPAP/CPAP not in use Non-compliant

## 2023-01-09 NOTE — Progress Notes (Signed)
Referring Physician(s): Kinsinger,L  Supervising Physician: Roanna Banning  [name] Status:  [facility] - In-pt  Chief Complaint:  Abdominal pain/fluid collection  Subjective: Pt cont to c/o upper abd discomfort; occ nausea; afebrile; WBC trending down   Allergies: Asa [aspirin], Clindamycin/lincomycin, Nsaids, Penicillins, and Sulfa antibiotics  Medications: Prior to Admission medications   Medication Sig Start Date End Date Taking? Authorizing Provider  acetaminophen (TYLENOL) 325 MG tablet Take 2 tablets (650 mg total) by mouth every 6 (six) hours as needed for mild pain. 07/01/22  Yes Tyrone Nine, MD  allopurinol (ZYLOPRIM) 100 MG tablet Take 1 tablet (100 mg total) by mouth daily. 07/02/22  Yes Tyrone Nine, MD  gabapentin (NEURONTIN) 300 MG capsule Take 1 capsule (300 mg total) by mouth 4 (four) times daily. 07/01/22  Yes Tyrone Nine, MD  hydrocortisone (ANUSOL-HC) 2.5 % rectal cream Place 1 Application rectally 2 (two) times daily.   Yes [provider]  Lidocaine-Menthol 4-5 % PTCH Apply 1 patch topically daily. Remove after 12 hours   Yes [provider]  ondansetron (ZOFRAN-ODT) 4 MG disintegrating tablet Take 1 tablet (4 mg total) by mouth every 8 (eight) hours as needed for nausea. 08/12/22  Yes Eber Hong, MD  pantoprazole (PROTONIX) 40 MG tablet Take 1 tablet (40 mg total) by mouth 2 (two) times daily before a meal. 07/01/22  Yes Tyrone Nine, MD  traMADol (ULTRAM) 50 MG tablet Take 50 mg by mouth 3 (three) times daily as needed for moderate pain.   Yes [provider]     Vital Signs: BP 123/64 (BP Location: Left Leg)   Pulse 99   Temp 98.6 F (Steven C) (Oral)   Resp 20   Ht 5\' 7"  (1.702 m)   Wt (!) 518 lb 1.3 oz (235 kg)   SpO2 92%   BMI 81.14 kg/m   Physical Exam: awake/alert;left upper abd drain intact, insertion site ok, mildly tender, OP 200 cc yesterday, 35 cc today hazy, light yellow fluid; drain flushed with minimal  return  Imaging: CT GUIDED PERITONEAL/RETROPERITONEAL FLUID DRAIN BY PERC CATH  Result Date: 01/08/2023 INDICATION: [name] with suspected fluid collection between the stomach and left lobe of the liver in the setting of a bile leak. He presents for attempted imaging guided drain placement. EXAM: None combined ultrasound and CT guided placement of percutaneous drainage catheter MEDICATIONS: The [name] is currently admitted to the hospital and receiving intravenous antibiotics. The antibiotics were administered within an appropriate time frame prior to the initiation of the procedure. ANESTHESIA/SEDATION: Moderate (conscious) sedation was employed during this procedure. A total of Versed 2 mg and Fentanyl 200 mcg was administered intravenously by the radiology nurse. Total intra-service moderate Sedation Time: 23 minutes. The [name]'s level of consciousness and vital signs were monitored continuously by radiology nursing throughout the procedure under my direct supervision. COMPLICATIONS: None immediate. PROCEDURE: Informed written consent was obtained from the [name] after a thorough discussion of the procedural risks, benefits and alternatives. All questions were addressed. Maximal Sterile Barrier Technique was utilized including caps, mask, sterile gowns, sterile gloves, sterile drape, hand hygiene and skin antiseptic. A timeout was performed prior to the initiation of the procedure. Ultrasound was used to interrogate the upper abdomen. The stomach and liver were identified. The fluid collection between the 2 was successfully identified. A suitable skin entry site was selected and marked. Local anesthesia was attained by infiltration with 1% lidocaine. A small dermatotomy was made. Under real-time ultrasound  guidance, a 15 cm 18 gauge trocar needle was carefully advanced into the fluid collection. There was return of bilious fluid. A 0.035 wire was then coiled in the collection. The wire was  removed. The percutaneous tract was dilated to 10 [place]. A Cook 10 [place] all-purpose drain was advanced over the wire and formed. There was return of cloudy bilious fluid. A sample was sent for Gram stain and culture. The [name] was then carefully repositioned to facilitate passage of the abdomen into the CT scanner. A limited CT scan was performed confirming excellent positioning of the drain. No evidence of immediate complication. The drain was connected to JP bulb suction and secured to the skin with 0 Prolene suture. IMPRESSION: Successful placement of 10 French percutaneous drainage catheter. Electronically Signed   By: Malachy Moan M.D.   On: 01/08/2023 13:00   CT ABDOMEN PELVIS W CONTRAST  Result Date: 01/06/2023 CLINICAL DATA:  Abdominal pain. Two weeks postop from laparoscopic cholecystectomy. EXAM: CT ABDOMEN AND PELVIS WITH CONTRAST TECHNIQUE: Multidetector CT imaging of the abdomen and pelvis was performed using the standard protocol following bolus administration of intravenous contrast. RADIATION DOSE REDUCTION: This exam was performed according to the departmental dose-optimization program which includes automated exposure control, adjustment of the mA and/or kV according to [name] size and/or use of iterative reconstruction technique. CONTRAST:  OMNIPAQUE IOHEXOL 300 MG/ML  SOLN COMPARISON:  12/28/2022 FINDINGS: Significant image degradation noted due to beam attenuation artifact from large body habitus. Lower Chest: Tiny bilateral pleural effusions and mild bilateral lower lobe atelectasis. Hepatobiliary: Mild diffuse hepatic steatosis again noted. No suspicious hepatic masses identified. Prior cholecystectomy again noted. No evidence of biliary ductal dilatation. Right upper quadrant surgical drain remains in place. A new fluid collection is seen within gastrohepatic ligament which measures 9.7 x 7.0 cm. Small amount of free fluid seen along inferior margin of right hepatic lobe  and in left lower quadrant. Pancreas:  No mass or inflammatory changes. Spleen: Within normal limits in size and appearance. 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 No suspicious renal masses identified. No evidence of ureteral calculi or hydronephrosis. Foley catheter seen within the bladder. Stomach/Bowel: No evidence of bowel obstruction or other acute findings Vascular/Lymphatic: No pathologically enlarged lymph nodes. No acute vascular findings. Reproductive:  No mass or other significant abnormality. Other:  None. Musculoskeletal:  No suspicious bone lesions identified. IMPRESSION: Image degraded exam due to beam hardening artifact secondary to large habitus. New 9.7 cm fluid collection in gastrohepatic ligament, which may be due to biloma or abscess. Consider nuclear medicine hepatobiliary scan to evaluate for bile leak. Small amount of free fluid in right paracolic gutter and left lower quadrant. Tiny bilateral pleural effusions and mild bilateral lower lobe atelectasis. Electronically Signed   By: Danae Orleans M.D.   On: 01/06/2023 15:39    Labs:  CBC: Recent Labs    01/06/23 0720 01/07/23 0742 01/08/23 0346 01/09/23 0408  WBC 15.8* 16.4* 17.6* 11.3*  HGB 11.0* 10.8* 11.3* 11.1*  HCT 35.9* 35.3* 36.9* 36.8*  PLT 240 269 273 277    COAGS: Recent Labs    12/28/22 1147 12/30/22 0707 01/08/23 0346  INR 1.2 1.1 1.1  APTT 34  --   --     BMP: Recent Labs    01/06/23 0720 01/07/23 0511 01/08/23 0346 01/09/23 0408  NA 137 138 137 139  K 3.6 4.2 3.6 3.4*  CL 100  102 100 102  CO2 28 26 29 27   GLUCOSE 113* 115* 115* 120*  BUN 11 8 8 7   CALCIUM 7.7* 7.5* 7.7* 7.7*  CREATININE 0.38* 0.44* 0.54* 0.39*  GFRNONAA >60 >60 >60 >60    LIVER FUNCTION TESTS: Recent Labs    01/06/23 0720 01/07/23 0511 01/08/23 0346 01/09/23 0408  BILITOT 0.7 0.5 0.5 0.5  AST 11* 14* 12* 11*   ALT 11 14 10 11   ALKPHOS 64 69 71 64  PROT 5.3* 5.5* 5.8* 5.7*  ALBUMIN 2.1* 2.0* 2.0* 2.1*    Assessment and Plan: Pt s/p partial cholecystectomy 9/5 for chronic calculous cholecystitis with post op bile leak, surg drain in place; s/p upper abd drain placement (10 fr to JP)9/23; afebrile, WBC 11.3(17.6), hgb stable, fluid cx- few staph; cont current tx/drain irrigation/close output monitoring/lab checks; obtain f/u CT once output minimal over  3-4 day span or if WBC increases; other plans as per CCS/TRH   Electronically Signed: D. Jeananne Rama, PA-C 01/09/2023, 4:28 PM   I spent a total of 15 Minutes at the the [name]'s bedside AND on the [name]'s hospital floor or unit, greater than 50% of which was counseling/coordinating care for abdominal fluid collection drain    [name] ID: [name], Atkins   DOB: October 26, 1985, 37 y.o.   MRN: [id number]

## 2023-01-09 NOTE — Progress Notes (Signed)
19 Days Post-Op  Subjective: Continued pain issues, not tolerating much food  ROS: See above, otherwise other systems negative  Objective: Vital signs in last 24 hours: Temp:  [98.6 F (37 C)-99.1 F (37.3 C)] 98.6 F (37 C) (09/24 1115) Pulse Rate:  [90-99] 99 (09/24 1115) Resp:  [18-20] 20 (09/24 1115) BP: (109-123)/(59-64) 123/64 (09/24 1115) SpO2:  [92 %-95 %] 92 % (09/24 1115) Last BM Date : 01/08/23  Intake/Output from previous day: 09/23 0701 - 09/24 0700 In: 1000.8 [P.O.:480; IV Piggyback:520.8] Out: 1433 [Urine:1125; Drains:308] Intake/Output this shift: Total I/O In: -  Out: 605 [Urine:500; Drains:105]  PE: Gen: NAd Resp: nonlabored CV: RRR Abd: soft, drains with thin bilious output  Lab Results:  Recent Labs    01/08/23 0346 01/09/23 0408  WBC 17.6* 11.3*  HGB 11.3* 11.1*  HCT 36.9* 36.8*  PLT 273 277   BMET Recent Labs    01/08/23 0346 01/09/23 0408  NA 137 139  K 3.6 3.4*  CL 100 102  CO2 29 27  GLUCOSE 115* 120*  BUN 8 7  CREATININE 0.54* 0.39*  CALCIUM 7.7* 7.7*   PT/INR Recent Labs    01/08/23 0346  LABPROT 14.5  INR 1.1   CMP     Component Value Date/Time   NA 139 01/09/2023 0408   NA 139 11/14/2012 0000   K 3.4 (L) 01/09/2023 0408   K 4.1 11/14/2012 0000   CL 102 01/09/2023 0408   CO2 27 01/09/2023 0408   GLUCOSE 120 (H) 01/09/2023 0408   BUN 7 01/09/2023 0408   BUN 11 11/14/2012 0000   CREATININE 0.39 (L) 01/09/2023 0408   CREATININE 0.62 11/14/2012 0000   CALCIUM 7.7 (L) 01/09/2023 0408   PROT 5.7 (L) 01/09/2023 0408   ALBUMIN 2.1 (L) 01/09/2023 0408   ALBUMIN 2.6 11/14/2012 0000   AST 11 (L) 01/09/2023 0408   ALT 11 01/09/2023 0408   ALKPHOS 64 01/09/2023 0408   BILITOT 0.5 01/09/2023 0408   GFRNONAA >60 01/09/2023 0408   GFRAA >60 12/30/2017 0619   Lipase     Component Value Date/Time   LIPASE 14 12/30/2022 0938    Studies/Results: CT GUIDED PERITONEAL/RETROPERITONEAL FLUID DRAIN BY PERC  CATH  Result Date: 01/08/2023 INDICATION: 37 year old male with suspected fluid collection between the stomach and left lobe of the liver in the setting of a bile leak. He presents for attempted imaging guided drain placement. EXAM: None combined ultrasound and CT guided placement of percutaneous drainage catheter MEDICATIONS: The patient is currently admitted to the hospital and receiving intravenous antibiotics. The antibiotics were administered within an appropriate time frame prior to the initiation of the procedure. ANESTHESIA/SEDATION: Moderate (conscious) sedation was employed during this procedure. A total of Versed 2 mg and Fentanyl 200 mcg was administered intravenously by the radiology nurse. Total intra-service moderate Sedation Time: 23 minutes. The patient's level of consciousness and vital signs were monitored continuously by radiology nursing throughout the procedure under my direct supervision. COMPLICATIONS: None immediate. PROCEDURE: Informed written consent was obtained from the patient after a thorough discussion of the procedural risks, benefits and alternatives. All questions were addressed. Maximal Sterile Barrier Technique was utilized including caps, mask, sterile gowns, sterile gloves, sterile drape, hand hygiene and skin antiseptic. A timeout was performed prior to the initiation of the procedure. Ultrasound was used to interrogate the upper abdomen. The stomach and liver were identified. The fluid collection between the 2 was successfully identified. A suitable skin entry site  was selected and marked. Local anesthesia was attained by infiltration with 1% lidocaine. A small dermatotomy was made. Under real-time ultrasound guidance, a 15 cm 18 gauge trocar needle was carefully advanced into the fluid collection. There was return of bilious fluid. A 0.035 wire was then coiled in the collection. The wire was removed. The percutaneous tract was dilated to 10 [place]. A Cook 10 [place]  all-purpose drain was advanced over the wire and formed. There was return of cloudy bilious fluid. A sample was sent for Gram stain and culture. The patient was then carefully repositioned to facilitate passage of the abdomen into the CT scanner. A limited CT scan was performed confirming excellent positioning of the drain. No evidence of immediate complication. The drain was connected to JP bulb suction and secured to the skin with 0 Prolene suture. IMPRESSION: Successful placement of 10 French percutaneous drainage catheter. Electronically Signed   By: Malachy Moan M.D.   On: 01/08/2023 13:00    Anti-infectives: Anti-infectives (From admission, onward)    Start     Dose/Rate Route Frequency Ordered Stop   01/09/23 1315  linezolid (ZYVOX) IVPB 600 mg        600 mg 300 mL/hr over 60 Minutes Intravenous Every 12 hours 01/09/23 1219     01/01/23 2000  ceFEPIme (MAXIPIME) 2 g in sodium chloride 0.9 % 100 mL IVPB        2 g 200 mL/hr over 30 Minutes Intravenous Every 8 hours 01/01/23 1311     12/31/22 1245  linezolid (ZYVOX) IVPB 600 mg  Status:  Discontinued        600 mg 300 mL/hr over 60 Minutes Intravenous 2 times daily 12/31/22 1145 01/02/23 1146   12/29/22 2200  ceFEPIme (MAXIPIME) 2 g in sodium chloride 0.9 % 100 mL IVPB  Status:  Discontinued        2 g 200 mL/hr over 30 Minutes Intravenous Every 12 hours 12/28/22 2127 12/29/22 1401   12/29/22 1500  ceFEPIme (MAXIPIME) 2 g in sodium chloride 0.9 % 100 mL IVPB  Status:  Discontinued        2 g 200 mL/hr over 30 Minutes Intravenous 2 times daily 12/29/22 1401 01/01/23 1311   12/29/22 1215  linezolid (ZYVOX) IVPB 600 mg  Status:  Discontinued        600 mg 300 mL/hr over 60 Minutes Intravenous Every 12 hours 12/29/22 1120 12/31/22 1145   12/29/22 0400  vancomycin (VANCOREADY) IVPB 1250 mg/250 mL  Status:  Discontinued        1,250 mg 166.7 mL/hr over 90 Minutes Intravenous Every 12 hours 12/28/22 1512 12/28/22 2127   12/28/22 2126   vancomycin variable dose per unstable renal function (pharmacist dosing)  Status:  Discontinued         Does not apply See admin instructions 12/28/22 2127 12/29/22 1120   12/28/22 2100  ceFEPIme (MAXIPIME) 2 g in sodium chloride 0.9 % 100 mL IVPB  Status:  Discontinued        2 g 200 mL/hr over 30 Minutes Intravenous Every 8 hours 12/28/22 1512 12/28/22 2127   12/28/22 2100  metroNIDAZOLE (FLAGYL) IVPB 500 mg        500 mg 100 mL/hr over 60 Minutes Intravenous Every 12 hours 12/28/22 1512     12/28/22 1600  vancomycin (VANCOREADY) IVPB 2000 mg/400 mL        2,000 mg 200 mL/hr over 120 Minutes Intravenous  Once 12/28/22 1512 12/28/22 1752   12/28/22  1230  piperacillin-tazobactam (ZOSYN) IVPB 3.375 g  Status:  Discontinued        3.375 g 12.5 mL/hr over 240 Minutes Intravenous Every 8 hours 12/28/22 1116 12/28/22 1503   12/21/22 0707  ceFAZolin (ANCEF) 3-0.9 GM/100ML-% IVPB       Note to Pharmacy: [facility]: cabinet override      12/21/22 0707 12/21/22 1914   12/21/22 0600  ceFAZolin (ANCEF) IVPB 2g/100 mL premix  Status:  Discontinued        2 g 200 mL/hr over 30 Minutes Intravenous On call to O.R. 12/21/22 0541 12/21/22 1058       Assessment/Plan  S/p partial cholecystectomy with bile leak. Had IR placed drain yesterday   FEN - reg diet VTE - heparin sq ID - cefepime flagyl linezolid Dispo - inpatient  I reviewed last 24 h vitals and pain scores, last 48 h intake and output, last 24 h labs and trends, and last 24 h imaging results.  This care required moderate level of medical decision making.    LOS: 18 days   [facility] Surgery 01/09/2023, 1:00 PM Please see Amion for pager number during day hours 7:00am-4:30pm or 7:00am -11:30am on weekends

## 2023-01-10 DIAGNOSIS — K8010 Calculus of gallbladder with chronic cholecystitis without obstruction: Secondary | ICD-10-CM | POA: Diagnosis not present

## 2023-01-10 DIAGNOSIS — N179 Acute kidney failure, unspecified: Secondary | ICD-10-CM | POA: Diagnosis not present

## 2023-01-10 DIAGNOSIS — J9601 Acute respiratory failure with hypoxia: Secondary | ICD-10-CM | POA: Diagnosis not present

## 2023-01-10 DIAGNOSIS — Z7401 Bed confinement status: Secondary | ICD-10-CM | POA: Diagnosis not present

## 2023-01-10 LAB — CBC WITH DIFFERENTIAL/PLATELET
Abs Immature Granulocytes: 0.07 10*3/uL (ref 0.00–0.07)
Basophils Absolute: 0.1 10*3/uL (ref 0.0–0.1)
Basophils Relative: 1 %
Eosinophils Absolute: 0.4 10*3/uL (ref 0.0–0.5)
Eosinophils Relative: 4 %
HCT: 38.2 % — ABNORMAL LOW (ref 39.0–52.0)
Hemoglobin: 11.1 g/dL — ABNORMAL LOW (ref 13.0–17.0)
Immature Granulocytes: 1 %
Lymphocytes Relative: 19 %
Lymphs Abs: 1.9 10*3/uL (ref 0.7–4.0)
MCH: 27.7 pg (ref 26.0–34.0)
MCHC: 29.1 g/dL — ABNORMAL LOW (ref 30.0–36.0)
MCV: 95.3 fL (ref 80.0–100.0)
Monocytes Absolute: 1.3 10*3/uL — ABNORMAL HIGH (ref 0.1–1.0)
Monocytes Relative: 12 %
Neutro Abs: 6.6 10*3/uL (ref 1.7–7.7)
Neutrophils Relative %: 63 %
Platelets: 298 10*3/uL (ref 150–400)
RBC: 4.01 MIL/uL — ABNORMAL LOW (ref 4.22–5.81)
RDW: 13.8 % (ref 11.5–15.5)
WBC: 10.3 10*3/uL (ref 4.0–10.5)
nRBC: 0 % (ref 0.0–0.2)

## 2023-01-10 LAB — MAGNESIUM: Magnesium: 1.9 mg/dL (ref 1.7–2.4)

## 2023-01-10 LAB — AEROBIC/ANAEROBIC CULTURE W GRAM STAIN (SURGICAL/DEEP WOUND)

## 2023-01-10 LAB — COMPREHENSIVE METABOLIC PANEL
ALT: 10 U/L (ref 0–44)
AST: 10 U/L — ABNORMAL LOW (ref 15–41)
Albumin: 2.2 g/dL — ABNORMAL LOW (ref 3.5–5.0)
Alkaline Phosphatase: 60 U/L (ref 38–126)
Anion gap: 7 (ref 5–15)
BUN: 5 mg/dL — ABNORMAL LOW (ref 6–20)
CO2: 30 mmol/L (ref 22–32)
Calcium: 7.7 mg/dL — ABNORMAL LOW (ref 8.9–10.3)
Chloride: 101 mmol/L (ref 98–111)
Creatinine, Ser: 0.56 mg/dL — ABNORMAL LOW (ref 0.61–1.24)
GFR, Estimated: >60 mL/min (ref >60)
Glucose, Bld: 136 mg/dL — ABNORMAL HIGH (ref 70–99)
Potassium: 4 mmol/L (ref 3.5–5.1)
Sodium: 138 mmol/L (ref 135–145)
Total Bilirubin: 0.4 mg/dL (ref 0.3–1.2)
Total Protein: 5.8 g/dL — ABNORMAL LOW (ref 6.5–8.1)

## 2023-01-10 LAB — PHOSPHORUS: Phosphorus: 3.5 mg/dL (ref 2.5–4.6)

## 2023-01-10 LAB — GLUCOSE, CAPILLARY
Glucose-Capillary: 119 mg/dL — ABNORMAL HIGH (ref 70–99)
Glucose-Capillary: 124 mg/dL — ABNORMAL HIGH (ref 70–99)
Glucose-Capillary: 127 mg/dL — ABNORMAL HIGH (ref 70–99)
Glucose-Capillary: 130 mg/dL — ABNORMAL HIGH (ref 70–99)
Glucose-Capillary: 137 mg/dL — ABNORMAL HIGH (ref 70–99)
Glucose-Capillary: 169 mg/dL — ABNORMAL HIGH (ref 70–99)

## 2023-01-10 MED ORDER — OXYCODONE HCL 5 MG PO TABS
15.0000 mg | ORAL_TABLET | ORAL | Status: DC | PRN
  Administered 2023-01-10 – 2023-01-12 (×13): 20 mg via ORAL
  Filled 2023-01-10 (×13): qty 4

## 2023-01-10 NOTE — Progress Notes (Signed)
[facility] consult progress note                                                                              [name], is a 37 y.o. male, DOB - 1986-04-17, ZOX:[id number] Admit date - 12/21/2022    Outpatient Primary MD for the patient is Hasanaj, Myra Gianotti, MD  LOS - 19  days  No chief complaint on file.      Brief summary   [name] is a 37 y/o male with obesity, NASH who presented with acute cholecystitis requiring cholecystectomy, which was complicated by his hepatomegaly. He had a bile leak post-op requiring a drain  Significant Hospital Events:    9/5 - Underwent elective lap chole with CCS. Partial cholecystectomy completed in the setting of poor visualization. EBL . Drain left in place. 9/12 - Transferred to SDU for worsening hypoxia, tachypnea, tachycardia. Hypotensive. CT Chest/A/P with postoperative changes, surgical drain in gallbladder fossa, minimal L basilar subsegmental atelectasis, L adrenal adenoma. TRH consulted. Fluids/Zosyn given, broadened to vanc/cefepime/Flagyl. Refused BiPAP/stated he would refuse intubation even if it meant death. Made DNR. Later in shift more hypotensive, PCCM consulted for pressors. Code status changed back to full code. Vasopressors initiated. 9/13 - A-line, LIJ CVC placed. High pressor requirements with NE 38, Neo 70, vaso 0.03. 9/16: neo stopped 9/13. NE and vaso stopped 9/15. Duoneb for wheezing. A-line removed. Transfer to SDU.  9/17: CCM requested TRH to continue to follow for medical management.  Surgery remains primary service. 01/07/2023-CT scan shows new 9.7 cm fluid collection, IR was consulted. 01/08/2023 - drain placement.  Diarrhea improving  Assessment & Plan     Principal Problem:  Chronic calculous cholecystitis, sepsis from biliary source Biliary leak, post operatively.  New 9.7 cm fluid collection in the gastrohepatic ligament s/p drain placement on 9/23 -Continue broad-spectrum  antibiotics, IV cefepime, Flagyl.  Repeat blood cultures NTD. -management per general surgery -Biliary drains in place   Active Problems: Acute on chronic respiratory failure with hypoxia -Multifactorial secondary to aggressive fluid resuscitation during shock, OSA, OHS -improved, on 2 L O2 via [place] -Negative balance of 1.9 L.    OHS/OSA: Largely related to habitus, morbid obesity -- Currently stable, on O2 2 L   Acute kidney injury -  In the setting of hypotension, sepsis.  Likely ATN and combination of obstructive physiology, unable to pass urine prior to Foley placement.   - Status post Foley placement with a cystoscopy 9/14 by urology -Renal function resolved, Cr 0.5   Morbid obesity Estimated body mass index is 81.14 kg/m as calculated from the following:   Height as of this encounter: 5\' 7"  (1.702 m).   Weight as of this encounter: 235 kg.  Code Status: Full code DVT Prophylaxis:  heparin injection 5,000 Units Start: 01/08/23 2200 SCD's Start: 12/21/22 1116   Level of Care: Level of care: Progressive Family Communication:  Disposition Plan:      Remains inpatient appropriate: Per primary service, surgery     Antimicrobials:   Anti-infectives (From admission, onward)    Start     Dose/Rate Route Frequency Ordered Stop   01/09/23  1315  linezolid (ZYVOX) IVPB 600 mg        600 mg 300 mL/hr over 60 Minutes Intravenous Every 12 hours 01/09/23 1219     01/01/23 2000  ceFEPIme (MAXIPIME) 2 g in sodium chloride 0.9 % 100 mL IVPB        2 g 200 mL/hr over 30 Minutes Intravenous Every 8 hours 01/01/23 1311     12/31/22 1245  linezolid (ZYVOX) IVPB 600 mg  Status:  Discontinued        600 mg 300 mL/hr over 60 Minutes Intravenous 2 times daily 12/31/22 1145 01/02/23 1146   12/29/22 2200  ceFEPIme (MAXIPIME) 2 g in sodium chloride 0.9 % 100 mL IVPB  Status:  Discontinued        2 g 200 mL/hr over 30 Minutes Intravenous Every 12 hours 12/28/22 2127 12/29/22 1401   12/29/22  1500  ceFEPIme (MAXIPIME) 2 g in sodium chloride 0.9 % 100 mL IVPB  Status:  Discontinued        2 g 200 mL/hr over 30 Minutes Intravenous 2 times daily 12/29/22 1401 01/01/23 1311   12/29/22 1215  linezolid (ZYVOX) IVPB 600 mg  Status:  Discontinued        600 mg 300 mL/hr over 60 Minutes Intravenous Every 12 hours 12/29/22 1120 12/31/22 1145   12/29/22 0400  vancomycin (VANCOREADY) IVPB 1250 mg/250 mL  Status:  Discontinued        1,250 mg 166.7 mL/hr over 90 Minutes Intravenous Every 12 hours 12/28/22 1512 12/28/22 2127   12/28/22 2126  vancomycin variable dose per unstable renal function (pharmacist dosing)  Status:  Discontinued         Does not apply See admin instructions 12/28/22 2127 12/29/22 1120   12/28/22 2100  ceFEPIme (MAXIPIME) 2 g in sodium chloride 0.9 % 100 mL IVPB  Status:  Discontinued        2 g 200 mL/hr over 30 Minutes Intravenous Every 8 hours 12/28/22 1512 12/28/22 2127   12/28/22 2100  metroNIDAZOLE (FLAGYL) IVPB 500 mg        500 mg 100 mL/hr over 60 Minutes Intravenous Every 12 hours 12/28/22 1512     12/28/22 1600  vancomycin (VANCOREADY) IVPB 2000 mg/400 mL        2,000 mg 200 mL/hr over 120 Minutes Intravenous  Once 12/28/22 1512 12/28/22 1752   12/28/22 1230  piperacillin-tazobactam (ZOSYN) IVPB 3.375 g  Status:  Discontinued        3.375 g 12.5 mL/hr over 240 Minutes Intravenous Every 8 hours 12/28/22 1116 12/28/22 1503   12/21/22 0707  ceFAZolin (ANCEF) 3-0.9 GM/100ML-% IVPB       Note to Pharmacy: Blair Promise B: cabinet override      12/21/22 0707 12/21/22 1914   12/21/22 0600  ceFAZolin (ANCEF) IVPB 2g/100 mL premix  Status:  Discontinued        2 g 200 mL/hr over 30 Minutes Intravenous On call to O.R. 12/21/22 0541 12/21/22 1058          Medications  allopurinol  100 mg Oral Daily   Chlorhexidine Gluconate Cloth  6 each Topical Daily   colchicine  0.6 mg Oral Daily   gabapentin  300 mg Oral QID   Gerhardt's butt cream   Topical BID    guaiFENesin  1,200 mg Oral BID   heparin injection (subcutaneous)  5,000 Units Subcutaneous Q8H   hydrocortisone  1 Application Rectal BID   insulin aspart  0-20 Units  Subcutaneous Q4H   lidocaine  1 patch Transdermal Daily   nystatin   Topical BID   pantoprazole (PROTONIX) IV  40 mg Intravenous Q12H   Ensure Max Protein  11 oz Oral Daily   sodium chloride flush  5 mL Intracatheter Q8H      Subjective:   [name] was seen and examined today.  Patient seen and examined in the room today, had outside food, states from last night.  No acute nausea vomiting, still has abdominal discomfort, states pain medication is not lasting very long. 2 drains+.   Objective:   Vitals:   01/09/23 0440 01/09/23 1115 01/09/23 2052 01/10/23 0427  BP: 114/63 123/64 120/72 (!) 115/59  Pulse: 90 99 94 (!) 105  Resp: 18 20 20 16   Temp: 98.9 F (37.2 C) 98.6 F (37 C) 98 F (36.7 C) 97.8 F (36.6 C)  TempSrc: Oral Oral Oral Oral  SpO2: 93% 92% 91% (!) 88%  Weight:      Height:        Intake/Output Summary (Last 24 hours) at 01/10/2023 1102 Last data filed at 01/10/2023 0945 Gross per 24 hour  Intake 815 ml  Output 1500 ml  Net -685 ml     Wt Readings from Last 3 Encounters:  12/21/22 (!) 235 kg  08/17/22 (!) 235 kg  08/11/22 (!) 181.4 kg   Physical Exam General: Alert and oriented x 3, NAD Cardiovascular: S1 S2 clear, RRR.  Respiratory: CTAB, no wheezing Gastrointestinal: obese, mild diffuse TTP, biliary drain on right and another on left side, NBS Ext: no pedal edema bilaterally Neuro: no new deficits Psych: Normal affect     Data Reviewed:  I have personally reviewed following labs    CBC Lab Results  Component Value Date   WBC 10.3 01/10/2023   RBC 4.01 (L) 01/10/2023   HGB 11.1 (L) 01/10/2023   HCT 38.2 (L) 01/10/2023   MCV 95.3 01/10/2023   MCH 27.7 01/10/2023   PLT 298 01/10/2023   MCHC 29.1 (L) 01/10/2023   RDW 13.8 01/10/2023   LYMPHSABS 1.9 01/10/2023    MONOABS 1.3 (H) 01/10/2023   EOSABS 0.4 01/10/2023   BASOSABS 0.1 01/10/2023     Last metabolic panel Lab Results  Component Value Date   NA 138 01/10/2023   K 4.0 01/10/2023   CL 101 01/10/2023   CO2 30 01/10/2023   BUN 5 (L) 01/10/2023   CREATININE 0.56 (L) 01/10/2023   GLUCOSE 136 (H) 01/10/2023   GFRNONAA >60 01/10/2023   GFRAA >60 12/30/2017   CALCIUM 7.7 (L) 01/10/2023   PHOS 3.5 01/10/2023   PROT 5.8 (L) 01/10/2023   ALBUMIN 2.2 (L) 01/10/2023   BILITOT 0.4 01/10/2023   ALKPHOS 60 01/10/2023   AST 10 (L) 01/10/2023   ALT 10 01/10/2023   ANIONGAP 7 01/10/2023    CBG (last 3)  Recent Labs    01/10/23 0032 01/10/23 0356 01/10/23 0756  GLUCAP 124* 119* 127*      Coagulation Profile: Recent Labs  Lab 01/08/23 0346  INR 1.1     Radiology Studies: I have personally reviewed the imaging studies  CT GUIDED PERITONEAL/RETROPERITONEAL FLUID DRAIN BY PERC CATH  Result Date: 01/08/2023 INDICATION: 37 year old male with suspected fluid collection between the stomach and left lobe of the liver in the setting of a bile leak. He presents for attempted imaging guided drain placement. EXAM: None combined ultrasound and CT guided placement of percutaneous drainage catheter MEDICATIONS: The patient is currently admitted  to the hospital and receiving intravenous antibiotics. The antibiotics were administered within an appropriate time frame prior to the initiation of the procedure. ANESTHESIA/SEDATION: Moderate (conscious) sedation was employed during this procedure. A total of Versed 2 mg and Fentanyl 200 mcg was administered intravenously by the radiology nurse. Total intra-service moderate Sedation Time: 23 minutes. The patient's level of consciousness and vital signs were monitored continuously by radiology nursing throughout the procedure under my direct supervision. COMPLICATIONS: None immediate. PROCEDURE: Informed written consent was obtained from the patient after a  thorough discussion of the procedural risks, benefits and alternatives. All questions were addressed. Maximal Sterile Barrier Technique was utilized including caps, mask, sterile gowns, sterile gloves, sterile drape, hand hygiene and skin antiseptic. A timeout was performed prior to the initiation of the procedure. Ultrasound was used to interrogate the upper abdomen. The stomach and liver were identified. The fluid collection between the 2 was successfully identified. A suitable skin entry site was selected and marked. Local anesthesia was attained by infiltration with 1% lidocaine. A small dermatotomy was made. Under real-time ultrasound guidance, a 15 cm 18 gauge trocar needle was carefully advanced into the fluid collection. There was return of bilious fluid. A 0.035 wire was then coiled in the collection. The wire was removed. The percutaneous tract was dilated to 10 [place]. A Cook 10 [place] all-purpose drain was advanced over the wire and formed. There was return of cloudy bilious fluid. A sample was sent for Gram stain and culture. The patient was then carefully repositioned to facilitate passage of the abdomen into the CT scanner. A limited CT scan was performed confirming excellent positioning of the drain. No evidence of immediate complication. The drain was connected to JP bulb suction and secured to the skin with 0 Prolene suture. IMPRESSION: Successful placement of 10 French percutaneous drainage catheter. Electronically Signed   By: Malachy Moan M.D.   On: 01/08/2023 13:00       Jonai Weyland M.D. [facility] 01/10/2023, 11:02 AM  Available via Epic secure chat 7am-7pm After 7 pm, please refer to night coverage provider listed on amion.

## 2023-01-10 NOTE — Plan of Care (Signed)
Problem: Nutrition: Goal: Adequate nutrition will be maintained Outcome: Progressing   Problem: Elimination: Goal: Will not experience complications related to bowel motility Outcome: Progressing

## 2023-01-10 NOTE — Progress Notes (Signed)
20 Days Post-Op  Subjective: Continued pain issues, tolerated protein shake and a few bites of chicken, drinking well, no appetite  ROS: See above, otherwise other systems negative  Objective: Vital signs in last 24 hours: Temp:  [97.8 F (36.6 C)-98.6 F (37 C)] 97.8 F (36.6 C) (09/25 0427) Pulse Rate:  [94-105] 105 (09/25 0427) Resp:  [16-20] 16 (09/25 0427) BP: (115-123)/(59-72) 115/59 (09/25 0427) SpO2:  [88 %-92 %] 88 % (09/25 0427) Last BM Date : 01/10/23  Intake/Output from previous day: 09/24 0701 - 09/25 0700 In: 1825 [P.O.:460; IV Piggyback:1360] Out: 2001 [Urine:1675; Drains:325; Stool:1] Intake/Output this shift: No intake/output data recorded.  PE: Gen: NAD Resp: nonlabored CV: RRR Abd: incisions c/d/I, drains with small amount bilious output  Lab Results:  Recent Labs    01/09/23 0408 01/10/23 0356  WBC 11.3* 10.3  HGB 11.1* 11.1*  HCT 36.8* 38.2*  PLT 277 298   BMET Recent Labs    01/09/23 0408 01/10/23 0356  NA 139 138  K 3.4* 4.0  CL 102 101  CO2 27 30  GLUCOSE 120* 136*  BUN 7 5*  CREATININE 0.39* 0.56*  CALCIUM 7.7* 7.7*   PT/INR Recent Labs    01/08/23 0346  LABPROT 14.5  INR 1.1   CMP     Component Value Date/Time   NA 138 01/10/2023 0356   NA 139 11/14/2012 0000   K 4.0 01/10/2023 0356   K 4.1 11/14/2012 0000   CL 101 01/10/2023 0356   CO2 30 01/10/2023 0356   GLUCOSE 136 (H) 01/10/2023 0356   BUN 5 (L) 01/10/2023 0356   BUN 11 11/14/2012 0000   CREATININE 0.56 (L) 01/10/2023 0356   CREATININE 0.62 11/14/2012 0000   CALCIUM 7.7 (L) 01/10/2023 0356   PROT 5.8 (L) 01/10/2023 0356   ALBUMIN 2.2 (L) 01/10/2023 0356   ALBUMIN 2.6 11/14/2012 0000   AST 10 (L) 01/10/2023 0356   ALT 10 01/10/2023 0356   ALKPHOS 60 01/10/2023 0356   BILITOT 0.4 01/10/2023 0356   GFRNONAA >60 01/10/2023 0356   GFRAA >60 12/30/2017 0619   Lipase     Component Value Date/Time   LIPASE 14 12/30/2022 0938     Studies/Results: CT GUIDED PERITONEAL/RETROPERITONEAL FLUID DRAIN BY PERC CATH  Result Date: 01/08/2023 INDICATION: 37 year old male with suspected fluid collection between the stomach and left lobe of the liver in the setting of a bile leak. He presents for attempted imaging guided drain placement. EXAM: None combined ultrasound and CT guided placement of percutaneous drainage catheter MEDICATIONS: The patient is currently admitted to the hospital and receiving intravenous antibiotics. The antibiotics were administered within an appropriate time frame prior to the initiation of the procedure. ANESTHESIA/SEDATION: Moderate (conscious) sedation was employed during this procedure. A total of Versed 2 mg and Fentanyl 200 mcg was administered intravenously by the radiology nurse. Total intra-service moderate Sedation Time: 23 minutes. The patient's level of consciousness and vital signs were monitored continuously by radiology nursing throughout the procedure under my direct supervision. COMPLICATIONS: None immediate. PROCEDURE: Informed written consent was obtained from the patient after a thorough discussion of the procedural risks, benefits and alternatives. All questions were addressed. Maximal Sterile Barrier Technique was utilized including caps, mask, sterile gowns, sterile gloves, sterile drape, hand hygiene and skin antiseptic. A timeout was performed prior to the initiation of the procedure. Ultrasound was used to interrogate the upper abdomen. The stomach and liver were identified. The fluid collection between the 2 was  successfully identified. A suitable skin entry site was selected and marked. Local anesthesia was attained by infiltration with 1% lidocaine. A small dermatotomy was made. Under real-time ultrasound guidance, a 15 cm 18 gauge trocar needle was carefully advanced into the fluid collection. There was return of bilious fluid. A 0.035 wire was then coiled in the collection. The wire was  removed. The percutaneous tract was dilated to 10 [place]. A Cook 10 [place] all-purpose drain was advanced over the wire and formed. There was return of cloudy bilious fluid. A sample was sent for Gram stain and culture. The patient was then carefully repositioned to facilitate passage of the abdomen into the CT scanner. A limited CT scan was performed confirming excellent positioning of the drain. No evidence of immediate complication. The drain was connected to JP bulb suction and secured to the skin with 0 Prolene suture. IMPRESSION: Successful placement of 10 French percutaneous drainage catheter. Electronically Signed   By: Malachy Moan M.D.   On: 01/08/2023 13:00    Anti-infectives: Anti-infectives (From admission, onward)    Start     Dose/Rate Route Frequency Ordered Stop   01/09/23 1315  linezolid (ZYVOX) IVPB 600 mg        600 mg 300 mL/hr over 60 Minutes Intravenous Every 12 hours 01/09/23 1219     01/01/23 2000  ceFEPIme (MAXIPIME) 2 g in sodium chloride 0.9 % 100 mL IVPB        2 g 200 mL/hr over 30 Minutes Intravenous Every 8 hours 01/01/23 1311     12/31/22 1245  linezolid (ZYVOX) IVPB 600 mg  Status:  Discontinued        600 mg 300 mL/hr over 60 Minutes Intravenous 2 times daily 12/31/22 1145 01/02/23 1146   12/29/22 2200  ceFEPIme (MAXIPIME) 2 g in sodium chloride 0.9 % 100 mL IVPB  Status:  Discontinued        2 g 200 mL/hr over 30 Minutes Intravenous Every 12 hours 12/28/22 2127 12/29/22 1401   12/29/22 1500  ceFEPIme (MAXIPIME) 2 g in sodium chloride 0.9 % 100 mL IVPB  Status:  Discontinued        2 g 200 mL/hr over 30 Minutes Intravenous 2 times daily 12/29/22 1401 01/01/23 1311   12/29/22 1215  linezolid (ZYVOX) IVPB 600 mg  Status:  Discontinued        600 mg 300 mL/hr over 60 Minutes Intravenous Every 12 hours 12/29/22 1120 12/31/22 1145   12/29/22 0400  vancomycin (VANCOREADY) IVPB 1250 mg/250 mL  Status:  Discontinued        1,250 mg 166.7 mL/hr over 90 Minutes  Intravenous Every 12 hours 12/28/22 1512 12/28/22 2127   12/28/22 2126  vancomycin variable dose per unstable renal function (pharmacist dosing)  Status:  Discontinued         Does not apply See admin instructions 12/28/22 2127 12/29/22 1120   12/28/22 2100  ceFEPIme (MAXIPIME) 2 g in sodium chloride 0.9 % 100 mL IVPB  Status:  Discontinued        2 g 200 mL/hr over 30 Minutes Intravenous Every 8 hours 12/28/22 1512 12/28/22 2127   12/28/22 2100  metroNIDAZOLE (FLAGYL) IVPB 500 mg        500 mg 100 mL/hr over 60 Minutes Intravenous Every 12 hours 12/28/22 1512     12/28/22 1600  vancomycin (VANCOREADY) IVPB 2000 mg/400 mL        2,000 mg 200 mL/hr over 120 Minutes Intravenous  Once  12/28/22 1512 12/28/22 1752   12/28/22 1230  piperacillin-tazobactam (ZOSYN) IVPB 3.375 g  Status:  Discontinued        3.375 g 12.5 mL/hr over 240 Minutes Intravenous Every 8 hours 12/28/22 1116 12/28/22 1503   12/21/22 0707  ceFAZolin (ANCEF) 3-0.9 GM/100ML-% IVPB       Note to Pharmacy: Blair Promise B: cabinet override      12/21/22 0707 12/21/22 1914   12/21/22 0600  ceFAZolin (ANCEF) IVPB 2g/100 mL premix  Status:  Discontinued        2 g 200 mL/hr over 30 Minutes Intravenous On call to O.R. 12/21/22 0541 12/21/22 1058       Assessment/Plan S/p partial cholecystectomy with bile leak. Had IR placed drain yesterday    FEN - reg diet VTE - heparin sq ID - cefepime flagyl linezolid Dispo - inpatient  I reviewed last 24 h vitals and pain scores, last 48 h intake and output, last 24 h labs and trends, and last 24 h imaging results.  This care required moderate level of medical decision making.    LOS: 19 days   [facility] 01/10/2023, 8:36 AM Please see Amion for pager number during day hours 7:00am-4:30pm or 7:00am -11:30am on weekends

## 2023-01-10 NOTE — Progress Notes (Signed)
   01/10/23 2300  BiPAP/CPAP/SIPAP  BiPAP/CPAP/SIPAP Pt Type Adult  Reason BIPAP/CPAP not in use Non-compliant

## 2023-01-11 DIAGNOSIS — K811 Chronic cholecystitis: Secondary | ICD-10-CM | POA: Diagnosis not present

## 2023-01-11 DIAGNOSIS — Z7401 Bed confinement status: Secondary | ICD-10-CM | POA: Diagnosis not present

## 2023-01-11 DIAGNOSIS — N179 Acute kidney failure, unspecified: Secondary | ICD-10-CM | POA: Diagnosis not present

## 2023-01-11 LAB — GLUCOSE, CAPILLARY
Glucose-Capillary: 107 mg/dL — ABNORMAL HIGH (ref 70–99)
Glucose-Capillary: 114 mg/dL — ABNORMAL HIGH (ref 70–99)
Glucose-Capillary: 121 mg/dL — ABNORMAL HIGH (ref 70–99)
Glucose-Capillary: 122 mg/dL — ABNORMAL HIGH (ref 70–99)
Glucose-Capillary: 125 mg/dL — ABNORMAL HIGH (ref 70–99)
Glucose-Capillary: 134 mg/dL — ABNORMAL HIGH (ref 70–99)
Glucose-Capillary: 143 mg/dL — ABNORMAL HIGH (ref 70–99)

## 2023-01-11 LAB — CBC
HCT: 38 % — ABNORMAL LOW (ref 39.0–52.0)
Hemoglobin: 11 g/dL — ABNORMAL LOW (ref 13.0–17.0)
MCH: 28.2 pg (ref 26.0–34.0)
MCHC: 28.9 g/dL — ABNORMAL LOW (ref 30.0–36.0)
MCV: 97.4 fL (ref 80.0–100.0)
Platelets: 317 10*3/uL (ref 150–400)
RBC: 3.9 MIL/uL — ABNORMAL LOW (ref 4.22–5.81)
RDW: 14 % (ref 11.5–15.5)
WBC: 8.4 10*3/uL (ref 4.0–10.5)
nRBC: 0 % (ref 0.0–0.2)

## 2023-01-11 LAB — CREATININE, SERUM
Creatinine, Ser: 0.59 mg/dL — ABNORMAL LOW (ref 0.61–1.24)
GFR, Estimated: >60 mL/min (ref >60)

## 2023-01-11 LAB — BASIC METABOLIC PANEL
Anion gap: 8 (ref 5–15)
BUN: 8 mg/dL (ref 6–20)
CO2: 29 mmol/L (ref 22–32)
Calcium: 7.8 mg/dL — ABNORMAL LOW (ref 8.9–10.3)
Chloride: 100 mmol/L (ref 98–111)
Creatinine, Ser: 0.59 mg/dL — ABNORMAL LOW (ref 0.61–1.24)
GFR, Estimated: >60 mL/min (ref >60)
Glucose, Bld: 111 mg/dL — ABNORMAL HIGH (ref 70–99)
Potassium: 4.3 mmol/L (ref 3.5–5.1)
Sodium: 137 mmol/L (ref 135–145)

## 2023-01-11 NOTE — Progress Notes (Signed)
[facility] consult progress note                                                                              [name], is a 37 y.o. male, DOB - 16-Jan-1986, YNW:[id number] Admit date - 12/21/2022    Outpatient Primary MD for the patient is Hasanaj, Myra Gianotti, MD  LOS - 20  days  No chief complaint on file.      Brief summary   [name] is a 37 y/o male with obesity, NASH who presented with acute cholecystitis requiring cholecystectomy, which was complicated by his hepatomegaly. He had a bile leak post-op requiring a drain  Significant Hospital Events:    9/5 - Underwent elective lap chole with CCS. Partial cholecystectomy completed in the setting of poor visualization. EBL . Drain left in place. 9/12 - Transferred to SDU for worsening hypoxia, tachypnea, tachycardia. Hypotensive. CT Chest/A/P with postoperative changes, surgical drain in gallbladder fossa, minimal L basilar subsegmental atelectasis, L adrenal adenoma. TRH consulted. Fluids/Zosyn given, broadened to vanc/cefepime/Flagyl. Refused BiPAP/stated he would refuse intubation even if it meant death. Made DNR. Later in shift more hypotensive, PCCM consulted for pressors. Code status changed back to full code. Vasopressors initiated. 9/13 - A-line, LIJ CVC placed. High pressor requirements with NE 38, Neo 70, vaso 0.03. 9/16: neo stopped 9/13. NE and vaso stopped 9/15. Duoneb for wheezing. A-line removed. Transfer to SDU.  9/17: CCM requested TRH to continue to follow for medical management.  Surgery remains primary service. 01/07/2023-CT scan shows new 9.7 cm fluid collection, IR was consulted. 01/08/2023 - drain placement.  Diarrhea improving 9/26: Abdominal fluid with MRSA, ID notified  Assessment & Plan     Principal Problem:  Chronic calculous cholecystitis, sepsis from biliary source Biliary leak, post operatively.  New 9.7 cm fluid collection in the gastrohepatic ligament s/p drain  placement on 9/23 -Continue broad-spectrum antibiotics.  Repeat blood cultures NTD. -management per general surgery -Biliary drains in place, abd cultures from 9/23 growing MRSA -Currently on IV cefepime, Flagyl, linezolid.  I have notified infectious disease, Dr. Thedore Mins for any further recommendations/discharge instructions.    Active Problems: Acute on chronic respiratory failure with hypoxia -Multifactorial secondary to aggressive fluid resuscitation during shock, OSA, OHS -Resolved     OHS/OSA: Largely related to habitus, morbid obesity -- Currently stable, on O2 2 L, refuses BiPAP   Acute kidney injury -  In the setting of hypotension, sepsis.  Likely ATN and combination of obstructive physiology, unable to pass urine prior to Foley placement.   - Status post Foley placement with a cystoscopy 9/14 by urology -Renal function improved, creatinine 0.5  Bedridden, Morbid obesity Estimated body mass index is 81.14 kg/m as calculated from the following:   Height as of this encounter: 5\' 7"  (1.702 m).   Weight as of this encounter: 235 kg.  Code Status: Full code DVT Prophylaxis:  heparin injection 5,000 Units Start: 01/08/23 2200 SCD's Start: 12/21/22 1116   Level of Care: Level of care: Progressive Family Communication:  Disposition Plan:      Remains inpatient appropriate: Per primary service, surgery  Per patient, he may  be discharged tomorrow, wants to go home.  I will sign off.  Disposition per primary service.   Antimicrobials:   Anti-infectives (From admission, onward)    Start     Dose/Rate Route Frequency Ordered Stop   01/09/23 1315  linezolid (ZYVOX) IVPB 600 mg        600 mg 300 mL/hr over 60 Minutes Intravenous Every 12 hours 01/09/23 1219     01/01/23 2000  ceFEPIme (MAXIPIME) 2 g in sodium chloride 0.9 % 100 mL IVPB        2 g 200 mL/hr over 30 Minutes Intravenous Every 8 hours 01/01/23 1311     12/31/22 1245  linezolid (ZYVOX) IVPB 600 mg  Status:   Discontinued        600 mg 300 mL/hr over 60 Minutes Intravenous 2 times daily 12/31/22 1145 01/02/23 1146   12/29/22 2200  ceFEPIme (MAXIPIME) 2 g in sodium chloride 0.9 % 100 mL IVPB  Status:  Discontinued        2 g 200 mL/hr over 30 Minutes Intravenous Every 12 hours 12/28/22 2127 12/29/22 1401   12/29/22 1500  ceFEPIme (MAXIPIME) 2 g in sodium chloride 0.9 % 100 mL IVPB  Status:  Discontinued        2 g 200 mL/hr over 30 Minutes Intravenous 2 times daily 12/29/22 1401 01/01/23 1311   12/29/22 1215  linezolid (ZYVOX) IVPB 600 mg  Status:  Discontinued        600 mg 300 mL/hr over 60 Minutes Intravenous Every 12 hours 12/29/22 1120 12/31/22 1145   12/29/22 0400  vancomycin (VANCOREADY) IVPB 1250 mg/250 mL  Status:  Discontinued        1,250 mg 166.7 mL/hr over 90 Minutes Intravenous Every 12 hours 12/28/22 1512 12/28/22 2127   12/28/22 2126  vancomycin variable dose per unstable renal function (pharmacist dosing)  Status:  Discontinued         Does not apply See admin instructions 12/28/22 2127 12/29/22 1120   12/28/22 2100  ceFEPIme (MAXIPIME) 2 g in sodium chloride 0.9 % 100 mL IVPB  Status:  Discontinued        2 g 200 mL/hr over 30 Minutes Intravenous Every 8 hours 12/28/22 1512 12/28/22 2127   12/28/22 2100  metroNIDAZOLE (FLAGYL) IVPB 500 mg        500 mg 100 mL/hr over 60 Minutes Intravenous Every 12 hours 12/28/22 1512     12/28/22 1600  vancomycin (VANCOREADY) IVPB 2000 mg/400 mL        2,000 mg 200 mL/hr over 120 Minutes Intravenous  Once 12/28/22 1512 12/28/22 1752   12/28/22 1230  piperacillin-tazobactam (ZOSYN) IVPB 3.375 g  Status:  Discontinued        3.375 g 12.5 mL/hr over 240 Minutes Intravenous Every 8 hours 12/28/22 1116 12/28/22 1503   12/21/22 0707  ceFAZolin (ANCEF) 3-0.9 GM/100ML-% IVPB       Note to Pharmacy: Blair Promise B: cabinet override      12/21/22 0707 12/21/22 1914   12/21/22 0600  ceFAZolin (ANCEF) IVPB 2g/100 mL premix  Status:  Discontinued         2 g 200 mL/hr over 30 Minutes Intravenous On call to O.R. 12/21/22 0541 12/21/22 1058          Medications  allopurinol  100 mg Oral Daily   Chlorhexidine Gluconate Cloth  6 each Topical Daily   colchicine  0.6 mg Oral Daily   gabapentin  300 mg Oral QID  Gerhardt's butt cream   Topical BID   guaiFENesin  1,200 mg Oral BID   heparin injection (subcutaneous)  5,000 Units Subcutaneous Q8H   hydrocortisone  1 Application Rectal BID   insulin aspart  0-20 Units Subcutaneous Q4H   lidocaine  1 patch Transdermal Daily   nystatin   Topical BID   pantoprazole (PROTONIX) IV  40 mg Intravenous Q12H   Ensure Max Protein  11 oz Oral Daily   sodium chloride flush  5 mL Intracatheter Q8H      Subjective:   [name] was seen and examined today.  Seen this morning, eating breakfast, feels tired of being here and states that he may be discharged tomorrow.  No acute nausea or vomiting, still has abdominal discomfort, drains x 2.  No fevers or chills. Objective:   Vitals:   01/10/23 1937 01/11/23 0351 01/11/23 0440 01/11/23 1216  BP: 121/62 (!) 114/53  129/67  Pulse: 93 (!) 102  (!) 106  Resp: 15 16  18   Temp: 97.8 F (36.6 C) 97.9 F (36.6 C)  98.5 F (36.9 C)  TempSrc: Oral Oral  Oral  SpO2: (!) 88% (!) 84% 90% 90%  Weight:      Height:        Intake/Output Summary (Last 24 hours) at 01/11/2023 1244 Last data filed at 01/11/2023 0830 Gross per 24 hour  Intake 2770 ml  Output 1395 ml  Net 1375 ml     Wt Readings from Last 3 Encounters:  12/21/22 (!) 235 kg  08/17/22 (!) 235 kg  08/11/22 (!) 181.4 kg    Physical Exam General: Alert and oriented x 3, NAD Cardiovascular: S1 S2 clear, RRR.  Respiratory: CTAB, no wheezing Gastrointestinal: Obese, soft, biliary drain x 2, NBS  Ext: no pedal edema bilaterally Neuro: no new deficits Psych: Normal affect     Data Reviewed:  I have personally reviewed following labs    CBC Lab Results  Component Value  Date   WBC 8.4 01/11/2023   RBC 3.90 (L) 01/11/2023   HGB 11.0 (L) 01/11/2023   HCT 38.0 (L) 01/11/2023   MCV 97.4 01/11/2023   MCH 28.2 01/11/2023   PLT 317 01/11/2023   MCHC 28.9 (L) 01/11/2023   RDW 14.0 01/11/2023   LYMPHSABS 1.9 01/10/2023   MONOABS 1.3 (H) 01/10/2023   EOSABS 0.4 01/10/2023   BASOSABS 0.1 01/10/2023     Last metabolic panel Lab Results  Component Value Date   NA 137 01/11/2023   K 4.3 01/11/2023   CL 100 01/11/2023   CO2 29 01/11/2023   BUN 8 01/11/2023   CREATININE 0.59 (L) 01/11/2023   GLUCOSE 111 (H) 01/11/2023   GFRNONAA >60 01/11/2023   GFRAA >60 12/30/2017   CALCIUM 7.8 (L) 01/11/2023   PHOS 3.5 01/10/2023   PROT 5.8 (L) 01/10/2023   ALBUMIN 2.2 (L) 01/10/2023   BILITOT 0.4 01/10/2023   ALKPHOS 60 01/10/2023   AST 10 (L) 01/10/2023   ALT 10 01/10/2023   ANIONGAP 8 01/11/2023    CBG (last 3)  Recent Labs    01/11/23 0351 01/11/23 0757 01/11/23 1213  GLUCAP 134* 122* 107*      Coagulation Profile: Recent Labs  Lab 01/08/23 0346  INR 1.1     Radiology Studies: I have personally reviewed the imaging studies  No results found.     Thad Ranger M.D. [facility] 01/11/2023, 12:44 PM  Available via Epic secure chat 7am-7pm After 7 pm, please refer to  night coverage provider listed on amion.

## 2023-01-11 NOTE — Progress Notes (Signed)
21 Days Post-Op  Subjective: Vomited this morning, ate a salad yesterday, pain better  ROS: See above, otherwise other systems negative  Objective: Vital signs in last 24 hours: Temp:  [97.8 F (36.6 C)-98.3 F (36.8 C)] 97.9 F (36.6 C) (09/26 0351) Pulse Rate:  [93-102] 102 (09/26 0351) Resp:  [15-16] 16 (09/26 0351) BP: (114-121)/(53-65) 114/53 (09/26 0351) SpO2:  [84 %-90 %] 90 % (09/26 0440) Last BM Date : 01/10/23  Intake/Output from previous day: 09/25 0701 - 09/26 0700 In: 3010 [P.O.:1080; IV Piggyback:1920] Out: 1320 [Urine:1100; Drains:220] Intake/Output this shift: No intake/output data recorded.  PE: Gen: NAD Resp: nonlabored CV: RRR Abd: soft, NT, drains with bilious output  Lab Results:  Recent Labs    01/09/23 0408 01/10/23 0356  WBC 11.3* 10.3  HGB 11.1* 11.1*  HCT 36.8* 38.2*  PLT 277 298   BMET Recent Labs    01/09/23 0408 01/10/23 0356 01/11/23 0344  NA 139 138  --   K 3.4* 4.0  --   CL 102 101  --   CO2 27 30  --   GLUCOSE 120* 136*  --   BUN 7 5*  --   CREATININE 0.39* 0.56* 0.59*  CALCIUM 7.7* 7.7*  --    PT/INR No results for input(s): "LABPROT", "INR" in the last 72 hours. CMP     Component Value Date/Time   NA 138 01/10/2023 0356   NA 139 11/14/2012 0000   K 4.0 01/10/2023 0356   K 4.1 11/14/2012 0000   CL 101 01/10/2023 0356   CO2 30 01/10/2023 0356   GLUCOSE 136 (H) 01/10/2023 0356   BUN 5 (L) 01/10/2023 0356   BUN 11 11/14/2012 0000   CREATININE 0.59 (L) 01/11/2023 0344   CREATININE 0.62 11/14/2012 0000   CALCIUM 7.7 (L) 01/10/2023 0356   PROT 5.8 (L) 01/10/2023 0356   ALBUMIN 2.2 (L) 01/10/2023 0356   ALBUMIN 2.6 11/14/2012 0000   AST 10 (L) 01/10/2023 0356   ALT 10 01/10/2023 0356   ALKPHOS 60 01/10/2023 0356   BILITOT 0.4 01/10/2023 0356   GFRNONAA >60 01/11/2023 0344   GFRAA >60 12/30/2017 0619   Lipase     Component Value Date/Time   LIPASE 14 12/30/2022 0938    Studies/Results: No results  found.  Anti-infectives: Anti-infectives (From admission, onward)    Start     Dose/Rate Route Frequency Ordered Stop   01/09/23 1315  linezolid (ZYVOX) IVPB 600 mg        600 mg 300 mL/hr over 60 Minutes Intravenous Every 12 hours 01/09/23 1219     01/01/23 2000  ceFEPIme (MAXIPIME) 2 g in sodium chloride 0.9 % 100 mL IVPB        2 g 200 mL/hr over 30 Minutes Intravenous Every 8 hours 01/01/23 1311     12/31/22 1245  linezolid (ZYVOX) IVPB 600 mg  Status:  Discontinued        600 mg 300 mL/hr over 60 Minutes Intravenous 2 times daily 12/31/22 1145 01/02/23 1146   12/29/22 2200  ceFEPIme (MAXIPIME) 2 g in sodium chloride 0.9 % 100 mL IVPB  Status:  Discontinued        2 g 200 mL/hr over 30 Minutes Intravenous Every 12 hours 12/28/22 2127 12/29/22 1401   12/29/22 1500  ceFEPIme (MAXIPIME) 2 g in sodium chloride 0.9 % 100 mL IVPB  Status:  Discontinued        2 g 200 mL/hr over 30 Minutes  Intravenous 2 times daily 12/29/22 1401 01/01/23 1311   12/29/22 1215  linezolid (ZYVOX) IVPB 600 mg  Status:  Discontinued        600 mg 300 mL/hr over 60 Minutes Intravenous Every 12 hours 12/29/22 1120 12/31/22 1145   12/29/22 0400  vancomycin (VANCOREADY) IVPB 1250 mg/250 mL  Status:  Discontinued        1,250 mg 166.7 mL/hr over 90 Minutes Intravenous Every 12 hours 12/28/22 1512 12/28/22 2127   12/28/22 2126  vancomycin variable dose per unstable renal function (pharmacist dosing)  Status:  Discontinued         Does not apply See admin instructions 12/28/22 2127 12/29/22 1120   12/28/22 2100  ceFEPIme (MAXIPIME) 2 g in sodium chloride 0.9 % 100 mL IVPB  Status:  Discontinued        2 g 200 mL/hr over 30 Minutes Intravenous Every 8 hours 12/28/22 1512 12/28/22 2127   12/28/22 2100  metroNIDAZOLE (FLAGYL) IVPB 500 mg        500 mg 100 mL/hr over 60 Minutes Intravenous Every 12 hours 12/28/22 1512     12/28/22 1600  vancomycin (VANCOREADY) IVPB 2000 mg/400 mL        2,000 mg 200 mL/hr over 120  Minutes Intravenous  Once 12/28/22 1512 12/28/22 1752   12/28/22 1230  piperacillin-tazobactam (ZOSYN) IVPB 3.375 g  Status:  Discontinued        3.375 g 12.5 mL/hr over 240 Minutes Intravenous Every 8 hours 12/28/22 1116 12/28/22 1503   12/21/22 0707  ceFAZolin (ANCEF) 3-0.9 GM/100ML-% IVPB       Note to Pharmacy: Blair Promise B: cabinet override      12/21/22 0707 12/21/22 1914   12/21/22 0600  ceFAZolin (ANCEF) IVPB 2g/100 mL premix  Status:  Discontinued        2 g 200 mL/hr over 30 Minutes Intravenous On call to O.R. 12/21/22 0541 12/21/22 1058       Assessment/Plan S/p partial cholecystectomy with bile leak. Had IR placed drain 2 days ago   FEN - reg diet VTE - heparin sq ID - cefepime flagyl linezolid Dispo - inpatient  I reviewed last 24 h vitals and pain scores, last 48 h intake and output, last 24 h labs and trends, and last 24 h imaging results.  This care required high  level of medical decision making.    LOS: 20 days   [facility] 01/11/2023, 7:47 AM Please see Amion for pager number during day hours 7:00am-4:30pm or 7:00am -11:30am on weekends

## 2023-01-11 NOTE — Progress Notes (Signed)
Referring Physician(s): Kinsinger,L  Supervising Physician: Marliss Coots  Patient Status:  WL IP  Chief Complaint:  Abdominal pain/fluid collection   Subjective: Patient doing okay this a.m.  Did have episode of vomiting this morning; denies worsening abdominal pain.  States he may be discharged tomorrow   Allergies: Asa [aspirin], Clindamycin/lincomycin, Nsaids, Penicillins, and Sulfa antibiotics  Medications: Prior to Admission medications   Medication Sig Start Date End Date Taking? Authorizing Provider  acetaminophen (TYLENOL) 325 MG tablet Take 2 tablets (650 mg total) by mouth every 6 (six) hours as needed for mild pain. 07/01/22  Yes Tyrone Nine, MD  allopurinol (ZYLOPRIM) 100 MG tablet Take 1 tablet (100 mg total) by mouth daily. 07/02/22  Yes Tyrone Nine, MD  gabapentin (NEURONTIN) 300 MG capsule Take 1 capsule (300 mg total) by mouth 4 (four) times daily. 07/01/22  Yes Tyrone Nine, MD  hydrocortisone (ANUSOL-HC) 2.5 % rectal cream Place 1 Application rectally 2 (two) times daily.   Yes [provider]  Lidocaine-Menthol 4-5 % PTCH Apply 1 patch topically daily. Remove after 12 hours   Yes [provider]  ondansetron (ZOFRAN-ODT) 4 MG disintegrating tablet Take 1 tablet (4 mg total) by mouth every 8 (eight) hours as needed for nausea. 08/12/22  Yes Eber Hong, MD  pantoprazole (PROTONIX) 40 MG tablet Take 1 tablet (40 mg total) by mouth 2 (two) times daily before a meal. 07/01/22  Yes Tyrone Nine, MD  traMADol (ULTRAM) 50 MG tablet Take 50 mg by mouth 3 (three) times daily as needed for moderate pain.   Yes [provider]     Vital Signs: BP (!) 114/53 (BP Location: Left Arm)   Pulse (!) 102   Temp 97.9 F (36.6 C) (Oral)   Resp 16   Ht 5\' 7"  (1.702 m)   Wt (!) 518 lb 1.3 oz (235 kg)   SpO2 90%   BMI 81.14 kg/m   Physical Exam awake, alert.  Left upper abdominal drain intact, dressing clean and dry, output 40 cc slightly  hazy light yellow fluid.  Drain flushed with minimal return.  Imaging: CT GUIDED PERITONEAL/RETROPERITONEAL FLUID DRAIN BY PERC CATH  Result Date: 01/08/2023 INDICATION: 37 year old male with suspected fluid collection between the stomach and left lobe of the liver in the setting of a bile leak. He presents for attempted imaging guided drain placement. EXAM: None combined ultrasound and CT guided placement of percutaneous drainage catheter MEDICATIONS: The patient is currently admitted to the hospital and receiving intravenous antibiotics. The antibiotics were administered within an appropriate time frame prior to the initiation of the procedure. ANESTHESIA/SEDATION: Moderate (conscious) sedation was employed during this procedure. A total of Versed 2 mg and Fentanyl 200 mcg was administered intravenously by the radiology nurse. Total intra-service moderate Sedation Time: 23 minutes. The patient's level of consciousness and vital signs were monitored continuously by radiology nursing throughout the procedure under my direct supervision. COMPLICATIONS: None immediate. PROCEDURE: Informed written consent was obtained from the patient after a thorough discussion of the procedural risks, benefits and alternatives. All questions were addressed. Maximal Sterile Barrier Technique was utilized including caps, mask, sterile gowns, sterile gloves, sterile drape, hand hygiene and skin antiseptic. A timeout was performed prior to the initiation of the procedure. Ultrasound was used to interrogate the upper abdomen. The stomach and liver were identified. The fluid collection between the 2 was successfully identified. A suitable skin entry site was selected and marked. Local anesthesia was attained  by infiltration with 1% lidocaine. A small dermatotomy was made. Under real-time ultrasound guidance, a 15 cm 18 gauge trocar needle was carefully advanced into the fluid collection. There was return of bilious fluid. A 0.035 wire  was then coiled in the collection. The wire was removed. The percutaneous tract was dilated to 10 [place]. A Cook 10 [place] all-purpose drain was advanced over the wire and formed. There was return of cloudy bilious fluid. A sample was sent for Gram stain and culture. The patient was then carefully repositioned to facilitate passage of the abdomen into the CT scanner. A limited CT scan was performed confirming excellent positioning of the drain. No evidence of immediate complication. The drain was connected to JP bulb suction and secured to the skin with 0 Prolene suture. IMPRESSION: Successful placement of 10 French percutaneous drainage catheter. Electronically Signed   By: Malachy Moan M.D.   On: 01/08/2023 13:00    Labs:  CBC: Recent Labs    01/08/23 0346 01/09/23 0408 01/10/23 0356 01/11/23 0334  WBC 17.6* 11.3* 10.3 8.4  HGB 11.3* 11.1* 11.1* 11.0*  HCT 36.9* 36.8* 38.2* 38.0*  PLT 273 277 298 317    COAGS: Recent Labs    12/28/22 1147 12/30/22 0707 01/08/23 0346  INR 1.2 1.1 1.1  APTT 34  --   --     BMP: Recent Labs    01/08/23 0346 01/09/23 0408 01/10/23 0356 01/11/23 0334 01/11/23 0344  NA 137 139 138 137  --   K 3.6 3.4* 4.0 4.3  --   CL 100 102 101 100  --   CO2 29 27 30 29   --   GLUCOSE 115* 120* 136* 111*  --   BUN 8 7 5* 8  --   CALCIUM 7.7* 7.7* 7.7* 7.8*  --   CREATININE 0.54* 0.39* 0.56* 0.59* 0.59*  GFRNONAA >60 >60 >60 >60 >60    LIVER FUNCTION TESTS: Recent Labs    01/07/23 0511 01/08/23 0346 01/09/23 0408 01/10/23 0356  BILITOT 0.5 0.5 0.5 0.4  AST 14* 12* 11* 10*  ALT 14 10 11 10   ALKPHOS 69 71 64 60  PROT 5.5* 5.8* 5.7* 5.8*  ALBUMIN 2.0* 2.0* 2.1* 2.2*    Assessment and Plan: Pt s/p partial cholecystectomy 9/5 for chronic calculous cholecystitis with post op bile leak, surg drain in place; s/p upper abd drain placement (10 fr to JP)9/23; afebrile, drain for culture growing few MRSA, WBC normal, hemoglobin stable;   Drain  Location: LUQ Size: Fr size: 10 Fr Date of placement: 01/08/23  Currently to: Drain collection device: suction bulb 24 hour output:  Output by Drain (mL) 01/09/23 0701 - 01/09/23 1900 01/09/23 1901 - 01/10/23 0700 01/10/23 0701 - 01/10/23 1900 01/10/23 1901 - 01/11/23 0700 01/11/23 0701 - 01/11/23 0932  Closed System Drain Right RUQ Bulb (JP) 19 Fr. 70 178 15 165   Closed System Drain 2 Left LUQ Bulb (JP) 10 Fr. 35 42 15 25       Current examination: Flushes/aspirates easily.  Insertion site unremarkable. Suture and stat lock in place. Dressed appropriately.   Plan: Continue TID flushes with 5 cc NS. Record output Q shift. Dressing changes QD or PRN if soiled.  Call IR APP or on call IR MD if difficulty flushing or sudden change in drain output.  Repeat imaging/possible drain injection once output < 10 mL/QD (excluding flush material). Consideration for drain removal if output is < 10 mL/QD (excluding flush material), pending discussion  with the providing surgical service.  Discharge planning: Please contact IR APP or on call IR MD prior to patient d/c to ensure appropriate follow up plans are in place. Typically patient will follow up with IR clinic 10-14 days post d/c for repeat imaging/possible drain injection. IR scheduler will contact patient with date/time of appointment. Patient will need to flush drain QD with 5 cc NS, record output QD, dressing changes every 2-3 days or earlier if soiled.   IR will continue to follow - please call with questions or concerns.      Electronically Signed: D. Jeananne Rama, PA-C 01/11/2023, 9:29 AM   I spent a total of 15 Minutes at the the patient's bedside AND on the patient's hospital floor or unit, greater than 50% of which was counseling/coordinating care for upper abdominal fluid collection drain    Patient ID: [name], male   DOB: 1985-11-29, 37 y.o.   MRN: [id number]

## 2023-01-11 NOTE — Plan of Care (Signed)
Problem: Nutrition: Goal: Adequate nutrition will be maintained Outcome: Progressing   Problem: Elimination: Goal: Will not experience complications related to bowel motility Outcome: Progressing   Problem: Pain Managment: Goal: General experience of comfort will improve Outcome: Progressing

## 2023-01-11 NOTE — Progress Notes (Signed)
Patient refused Chg bath & Foley care.

## 2023-01-11 NOTE — Progress Notes (Signed)
   01/11/23 2046  BiPAP/CPAP/SIPAP  BiPAP/CPAP/SIPAP Pt Type Adult  Reason BIPAP/CPAP not in use Non-compliant

## 2023-01-12 LAB — GLUCOSE, CAPILLARY
Glucose-Capillary: 115 mg/dL — ABNORMAL HIGH (ref 70–99)
Glucose-Capillary: 111 mg/dL — ABNORMAL HIGH (ref 70–99)
Glucose-Capillary: 126 mg/dL — ABNORMAL HIGH (ref 70–99)
Glucose-Capillary: 112 mg/dL — ABNORMAL HIGH (ref 70–99)

## 2023-01-12 MED ORDER — OXYCODONE HCL 10 MG PO TABS: 10.0000 mg | ORAL_TABLET | ORAL | 0 refills | Status: AC | PRN

## 2023-01-12 MED ORDER — GERHARDT'S BUTT CREAM: 1.0000 | TOPICAL_CREAM | Freq: Two times a day (BID) | CUTANEOUS | 0 refills | Status: AC

## 2023-01-12 MED ORDER — AMOXICILLIN-POT CLAVULANATE 875-125 MG PO TABS
1.0000 | ORAL_TABLET | Freq: Two times a day (BID) | ORAL | 0 refills | Status: AC
Start: 2023-01-12 — End: 2023-02-02

## 2023-01-12 MED ORDER — METHOCARBAMOL 750 MG PO TABS
750.0000 mg | ORAL_TABLET | Freq: Three times a day (TID) | ORAL | 0 refills | Status: AC | PRN
Start: 2023-01-12 — End: ?

## 2023-01-12 MED ORDER — ORITAVANCIN DIPHOSPHATE 400 MG IV SOLR
1200.0000 mg | Freq: Once | INTRAVENOUS | Status: AC
  Administered 2023-01-12: 1200 mg via INTRAVENOUS
  Filled 2023-01-12: qty 120

## 2023-01-12 MED ORDER — LINEZOLID 600 MG PO TABS: 600.0000 mg | ORAL_TABLET | Freq: Two times a day (BID) | ORAL | 0 refills | Status: DC

## 2023-01-12 MED ORDER — LINEZOLID 600 MG PO TABS: 600.0000 mg | ORAL_TABLET | Freq: Two times a day (BID) | ORAL | 0 refills | Status: AC

## 2023-01-12 MED ORDER — ONDANSETRON 4 MG PO TBDP: 4.0000 mg | ORAL_TABLET | Freq: Four times a day (QID) | ORAL | 0 refills | Status: AC | PRN

## 2023-01-12 NOTE — Plan of Care (Signed)
  Problem: Pain Managment: Goal: General experience of comfort will improve Outcome: Progressing   Problem: Safety: Goal: Ability to remain free from injury will improve Outcome: Progressing   Problem: Education: Goal: Knowledge of General Education information will improve Description: Including pain rating scale, medication(s)/side effects and non-pharmacologic comfort measures Outcome: Progressing   Problem: Coping: Goal: Level of anxiety will decrease Outcome: Progressing

## 2023-01-12 NOTE — Care Management (Addendum)
This RNCM spoke with the [facility] regarding inability to for RN to call report. This RNCM spoke with Debbie with [facility] who's currently off shift. Debbie reports give 5 mins then call report to [place] w/[facility] [phone]. This RNCM notified AC, RN and previous shift RNCM.  - 6:20pm This RNCM receive a call back from Debbie with [facility] reporting their phone lines are out. Debbie took [employer] phone number,advising Nettie Elm will give her a call. Notified RN  No current TOC needs.

## 2023-01-12 NOTE — Discharge Summary (Signed)
Physician Discharge Summary  [name] BJY:[id number] DOB: [birth year] DOA: 12/21/2022  PCP: Toma Deiters, MD  Admit date: 12/21/2022 Discharge date:  01/12/2023   Recommendations for Outpatient Follow-up:   (include homehealth, outpatient follow-up instructions, specific recommendations for PCP to follow-up on, etc.)   Discharge Diagnoses:  Principal Problem:   Chronic calculous cholecystitis Active Problems:   Super morbid obesity with BMI 80   Gastric ulcer with hemorrhage   Peptic ulcer disease   GERD (gastroesophageal reflux disease)   Chronic cholecystitis   Septic shock (HCC)   AKI (acute kidney injury) (HCC)   Bedridden   Living in nursing home   Hyponatremia   Acute respiratory failure with hypoxia (HCC)   Fever   Surgical Procedure: lap partial cholecystectomy  Discharge Condition: Good Disposition: Home  Diet recommendation: low fat diet   Hospital Course:  37 yo male presented to hospital for cholecystectomy. He underwent partial cholecystectomy and had drain placed. Post op he had pain issues and biliary drainage. He continued to have biliary leak and Dr. Elnoria Howard was consulted and planned ERCP. However, he developed respiratory distress and was transferred to the ICU POD 6. He slowly improved with bipap and vasopressors. He was transferred to the floor and improved from respiratory standpoint and tolerated a diet. He had a new leukocytosis and was found to have a biloma which was drained with IR. Labs improved and he was discharged back to facility POD 22.  Discharge Instructions  Discharge Instructions     Diet - low sodium heart healthy   Complete by: As directed    Discharge wound care:   Complete by: As directed    Maintain drains and empty regularly   Increase activity slowly   Complete by: As directed       Allergies as of 01/12/2023       Reactions   Asa [aspirin] Other (See Comments)   Pt doesn't know reaction.   Clindamycin/lincomycin     Gastric ulcer    Nsaids Other (See Comments)   Causes Ulcers    Penicillins Other (See Comments)   Pt doesn't know reaction.   Sulfa Antibiotics Other (See Comments)   Pt doesn't know reaction.        Medication List     STOP taking these medications    traMADol 50 MG tablet Commonly known as: ULTRAM       TAKE these medications    acetaminophen 325 MG tablet Commonly known as: TYLENOL Take 2 tablets (650 mg total) by mouth every 6 (six) hours as needed for mild pain.   allopurinol 100 MG tablet Commonly known as: ZYLOPRIM Take 1 tablet (100 mg total) by mouth daily.   Anusol-HC 2.5 % rectal cream Generic drug: hydrocortisone Place 1 Application rectally 2 (two) times daily.   gabapentin 300 MG capsule Commonly known as: NEURONTIN Take 1 capsule (300 mg total) by mouth 4 (four) times daily.   Gerhardt's butt cream Crea Apply 1 Application topically 2 (two) times daily.   Lidocaine-Menthol 4-5 % Ptch Apply 1 patch topically daily. Remove after 12 hours   linezolid 600 MG tablet Commonly known as: ZYVOX Take 1 tablet (600 mg total) by mouth 2 (two) times daily.   methocarbamol 750 MG tablet Commonly known as: ROBAXIN Take 1 tablet (750 mg total) by mouth every 8 (eight) hours as needed for up to 30 doses for muscle spasms.   ondansetron 4 MG disintegrating tablet Commonly known as: ZOFRAN-ODT Take 1  tablet (4 mg total) by mouth every 6 (six) hours as needed for nausea. What changed: when to take this   Oxycodone HCl 10 MG Tabs Take 1-2 tablets (10-20 mg total) by mouth every 4 (four) hours as needed for up to 7 days (for pain).   pantoprazole 40 MG tablet Commonly known as: PROTONIX Take 1 tablet (40 mg total) by mouth 2 (two) times daily before a meal.               Discharge Care Instructions  (From admission, onward)           Start     Ordered   01/12/23 0000  Discharge wound care:       Comments: Maintain drains and empty  regularly   01/12/23 0800              The results of significant diagnostics from this hospitalization (including imaging, microbiology, ancillary and laboratory) are listed below for reference.    Significant Diagnostic Studies: CT GUIDED PERITONEAL/RETROPERITONEAL FLUID DRAIN BY PERC CATH  Result Date: 01/08/2023 INDICATION: 37 year old male with suspected fluid collection between the stomach and left lobe of the liver in the setting of a bile leak. He presents for attempted imaging guided drain placement. EXAM: None combined ultrasound and CT guided placement of percutaneous drainage catheter MEDICATIONS: The patient is currently admitted to the hospital and receiving intravenous antibiotics. The antibiotics were administered within an appropriate time frame prior to the initiation of the procedure. ANESTHESIA/SEDATION: Moderate (conscious) sedation was employed during this procedure. A total of Versed 2 mg and Fentanyl 200 mcg was administered intravenously by the radiology nurse. Total intra-service moderate Sedation Time: 23 minutes. The patient's level of consciousness and vital signs were monitored continuously by radiology nursing throughout the procedure under my direct supervision. COMPLICATIONS: None immediate. PROCEDURE: Informed written consent was obtained from the patient after a thorough discussion of the procedural risks, benefits and alternatives. All questions were addressed. Maximal Sterile Barrier Technique was utilized including caps, mask, sterile gowns, sterile gloves, sterile drape, hand hygiene and skin antiseptic. A timeout was performed prior to the initiation of the procedure. Ultrasound was used to interrogate the upper abdomen. The stomach and liver were identified. The fluid collection between the 2 was successfully identified. A suitable skin entry site was selected and marked. Local anesthesia was attained by infiltration with 1% lidocaine. A small dermatotomy was  made. Under real-time ultrasound guidance, a 15 cm 18 gauge trocar needle was carefully advanced into the fluid collection. There was return of bilious fluid. A 0.035 wire was then coiled in the collection. The wire was removed. The percutaneous tract was dilated to 10 [place]. A Cook 10 [place] all-purpose drain was advanced over the wire and formed. There was return of cloudy bilious fluid. A sample was sent for Gram stain and culture. The patient was then carefully repositioned to facilitate passage of the abdomen into the CT scanner. A limited CT scan was performed confirming excellent positioning of the drain. No evidence of immediate complication. The drain was connected to JP bulb suction and secured to the skin with 0 Prolene suture. IMPRESSION: Successful placement of 10 French percutaneous drainage catheter. Electronically Signed   By: Malachy Moan M.D.   On: 01/08/2023 13:00   CT ABDOMEN PELVIS W CONTRAST  Result Date: 01/06/2023 CLINICAL DATA:  Abdominal pain. Two weeks postop from laparoscopic cholecystectomy. EXAM: CT ABDOMEN AND PELVIS WITH CONTRAST TECHNIQUE: Multidetector CT imaging of the abdomen and  pelvis was performed using the standard protocol following bolus administration of intravenous contrast. RADIATION DOSE REDUCTION: This exam was performed according to the departmental dose-optimization program which includes automated exposure control, adjustment of the mA and/or kV according to patient size and/or use of iterative reconstruction technique. CONTRAST:  OMNIPAQUE IOHEXOL 300 MG/ML  SOLN COMPARISON:  12/28/2022 FINDINGS: Significant image degradation noted due to beam attenuation artifact from large body habitus. Lower Chest: Tiny bilateral pleural effusions and mild bilateral lower lobe atelectasis. Hepatobiliary: Mild diffuse hepatic steatosis again noted. No suspicious hepatic masses identified. Prior cholecystectomy again noted. No evidence of biliary ductal dilatation.  Right upper quadrant surgical drain remains in place. A new fluid collection is seen within gastrohepatic ligament which measures 9.7 x 7.0 cm. Small amount of free fluid seen along inferior margin of right hepatic lobe and in left lower quadrant. Pancreas:  No mass or inflammatory changes. Spleen: Within normal limits in size and appearance. 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 No suspicious renal masses identified. No evidence of ureteral calculi or hydronephrosis. Foley catheter seen within the bladder. Stomach/Bowel: No evidence of bowel obstruction or other acute findings Vascular/Lymphatic: No pathologically enlarged lymph nodes. No acute vascular findings. Reproductive:  No mass or other significant abnormality. Other:  None. Musculoskeletal:  No suspicious bone lesions identified. IMPRESSION: Image degraded exam due to beam hardening artifact secondary to large habitus. New 9.7 cm fluid collection in gastrohepatic ligament, which may be due to biloma or abscess. Consider nuclear medicine hepatobiliary scan to evaluate for bile leak. Small amount of free fluid in right paracolic gutter and left lower quadrant. Tiny bilateral pleural effusions and mild bilateral lower lobe atelectasis. Electronically Signed   By: Danae Orleans M.D.   On: 01/06/2023 15:39   DG CHEST PORT 1 VIEW  Result Date: 01/04/2023 CLINICAL DATA:  Leukocytosis EXAM: PORTABLE CHEST 1 VIEW COMPARISON:  12/29/2022 FINDINGS: Previously seen central venous catheter has been removed. Stable cardiomegaly with pulmonary vascular congestion. Increasing streaky bibasilar opacities. No pleural effusion or pneumothorax. IMPRESSION: 1. Increasing streaky bibasilar opacities, which may represent atelectasis or infection. 2. Cardiomegaly with pulmonary vascular congestion. Electronically Signed   By: Duanne Guess D.O.   On: 01/04/2023 14:17    DG CHEST PORT 1 VIEW  Result Date: 12/29/2022 CLINICAL DATA:  Central line placement EXAM: PORTABLE CHEST 1 VIEW COMPARISON:  Chest x-ray 12/28/2022 FINDINGS: There is a new left-sided central venous catheter with distal tip in the mid SVC. The heart is enlarged, unchanged. Central pulmonary vascular congestion persists. There are minimal patchy opacities in the lung bases, left greater than right. No pleural effusion or pneumothorax. Osseous structures are stable. IMPRESSION: 1. New left-sided central venous catheter with distal tip in the mid SVC. No pneumothorax. 2. Stable cardiomegaly with central pulmonary vascular congestion. Electronically Signed   By: Darliss Cheney M.D.   On: 12/29/2022 01:23   [place] EKG SITE RITE  Result Date: 12/28/2022 If Site Rite image not attached, placement could not be confirmed due to current cardiac rhythm.  DG Chest Port 1 View  Result Date: 12/28/2022 CLINICAL DATA:  Tachypnea, tachycardia EXAM: PORTABLE CHEST 1 VIEW COMPARISON:  12:03 p.m. FINDINGS: Lung volumes are small, but are symmetric and are stable since prior examination. Progressive perihilar pulmonary infiltrate is present in keeping with perihilar pulmonary edema. Stable cardiomegaly. No pneumothorax or pleural effusion. IMPRESSION: 1. Progressive perihilar pulmonary edema. Electronically Signed  By: Helyn Numbers M.D.   On: 12/28/2022 22:02   CT CHEST ABDOMEN PELVIS W CONTRAST  Result Date: 12/28/2022 CLINICAL DATA:  Postoperative abdominal pain. EXAM: CT CHEST, ABDOMEN, AND PELVIS WITH CONTRAST TECHNIQUE: Multidetector CT imaging of the chest, abdomen and pelvis was performed following the standard protocol during bolus administration of intravenous contrast. Very limited exam due to body habitus. RADIATION DOSE REDUCTION: This exam was performed according to the departmental dose-optimization program which includes automated exposure control, adjustment of the mA and/or kV according to patient size  and/or use of iterative reconstruction technique. CONTRAST:  OMNIPAQUE IOHEXOL 300 MG/ML  SOLN COMPARISON:  August 12, 2022.  October 11, 2015. FINDINGS: CT CHEST FINDINGS Cardiovascular: No significant vascular findings. Normal heart size. No pericardial effusion. Mediastinum/Nodes: No enlarged mediastinal, hilar, or axillary lymph nodes. Thyroid gland, trachea, and esophagus demonstrate no significant findings. Lungs/Pleura: No pneumothorax or pleural effusion is noted. Right lung is clear. Minimal left basilar subsegmental atelectasis is noted. Musculoskeletal: No chest wall mass or suspicious bone lesions identified. CT ABDOMEN PELVIS FINDINGS Hepatobiliary: Status post cholecystectomy. Surgical drain is seen with tip in gallbladder fossa. No definite biliary dilatation is noted. Visualization of hepatic parenchyma is limited due to body habitus, but no definite abnormality is noted. Pancreas: Not well visualized. Spleen: Normal in size without focal abnormality. Adrenals/Urinary Tract: Right adrenal gland appears normal. 3.6 cm left adrenal nodule is noted most consistent with adenoma. No hydronephrosis or renal obstruction is noted. Urinary bladder is decompressed. Stomach/Bowel: Stomach is unremarkable. There is no definite evidence of bowel obstruction or inflammation. Appendix is not visualized. Vascular/Lymphatic: No significant vascular findings are present. No enlarged abdominal or pelvic lymph nodes. Reproductive: Prostate is unremarkable. Other: No definite ascites or hernia is noted. Musculoskeletal: No definite fracture or other acute osseous abnormality is noted. IMPRESSION: Significantly limited exam due to soft tissue attenuation artifact secondary to body habitus. Status post cholecystectomy, with surgical drain tip seen in gallbladder fossa. Minimal left basilar subsegmental atelectasis. 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 Electronically Signed   By: Lupita Raider M.D.   On: 12/28/2022  14:46   DG Chest Port 1 View  Result Date: 12/28/2022 CLINICAL DATA:  Sepsis. EXAM: PORTABLE CHEST 1 VIEW COMPARISON:  December 22, 2022. FINDINGS: Stable cardiomegaly with central pulmonary vascular congestion. Minimal bibasilar subsegmental atelectasis is noted. Bony thorax is unremarkable. IMPRESSION: Stable cardiomegaly with central pulmonary vascular congestion. Minimal bibasilar subsegmental atelectasis. Electronically Signed   By: Lupita Raider M.D.   On: 12/28/2022 13:42   DG CHEST PORT 1 VIEW  Result Date: 12/22/2022 CLINICAL DATA:  Cough. EXAM: PORTABLE CHEST 1 VIEW COMPARISON:  Chest radiograph dated 12/29/2017. FINDINGS: Shallow inspiration. There is cardiomegaly with vascular congestion. Bilateral linear atelectasis. No focal consolidation, pleural effusion, or pneumothorax. No acute osseous pathology. IMPRESSION: Cardiomegaly with vascular congestion. No focal consolidation. Electronically Signed   By: Elgie Collard M.D.   On: 12/22/2022 15:51    Labs: Basic Metabolic Panel: Recent Labs  Lab 01/06/23 0720 01/07/23 0511 01/08/23 0346 01/09/23 0408 01/10/23 0356 01/11/23 0334 01/11/23 0344  NA 137 138 137 139 138 137  --   K 3.6 4.2 3.6 3.4* 4.0 4.3  --   CL 100 102 100 102 101 100  --   CO2 28 26 29 27 30 29   --   GLUCOSE 113* 115* 115* 120* 136* 111*  --   BUN 11 8 8 7  5* 8  --   CREATININE  0.38* 0.44* 0.54* 0.39* 0.56* 0.59* 0.59*  CALCIUM 7.7* 7.5* 7.7* 7.7* 7.7* 7.8*  --   MG 1.7 1.5* 1.9 1.7 1.9  --   --   PHOS 2.9 3.6 3.4 3.3 3.5  --   --    Liver Function Tests: Recent Labs  Lab 01/06/23 0720 01/07/23 0511 01/08/23 0346 01/09/23 0408 01/10/23 0356  AST 11* 14* 12* 11* 10*  ALT 11 14 10 11 10   ALKPHOS 64 69 71 64 60  BILITOT 0.7 0.5 0.5 0.5 0.4  PROT 5.3* 5.5* 5.8* 5.7* 5.8*  ALBUMIN 2.1* 2.0* 2.0* 2.1* 2.2*    CBC: Recent Labs  Lab 01/06/23 0720 01/07/23 0742 01/08/23 0346 01/09/23 0408 01/10/23 0356 01/11/23 0334  WBC 15.8* 16.4* 17.6*  11.3* 10.3 8.4  NEUTROABS 11.4* 12.2* 13.8* 7.8* 6.6  --   HGB 11.0* 10.8* 11.3* 11.1* 11.1* 11.0*  HCT 35.9* 35.3* 36.9* 36.8* 38.2* 38.0*  MCV 92.1 91.0 92.3 93.9 95.3 97.4  PLT 240 269 273 277 298 317    CBG: Recent Labs  Lab 01/11/23 1658 01/11/23 2018 01/11/23 2357 01/12/23 0422 01/12/23 0803  GLUCAP 121* 143* 114* 115* 111*    Principal Problem:   Chronic calculous cholecystitis Active Problems:   Super morbid obesity with BMI 80   Gastric ulcer with hemorrhage   Peptic ulcer disease   GERD (gastroesophageal reflux disease)   Chronic cholecystitis   Septic shock (HCC)   AKI (acute kidney injury) (HCC)   Bedridden   Living in nursing home   Hyponatremia   Acute respiratory failure with hypoxia (HCC)   Fever   Time coordinating discharge: 20 min

## 2023-01-12 NOTE — Progress Notes (Signed)
Patient refused peri/wound care.

## 2023-01-12 NOTE — Consult Note (Signed)
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  ERCP was planned but then patient developed hypoxia, tachypnea and tachycardia suspected secondary to biliary source.  Started on broad-spectrum antibiotics with linezolid cefepime and Flagyl.  Linezolid was stopped on 9/17 - He spiked a temp and had leukocytosis around 17K.  Repeat CT showed 9.7 cm fluid collection in the gastrohepatic ligament which could be due to biloma or abscess. - IR had done a CT-guided drain placement into the into the left upper quadrant fluid collection with cloudy bilious fluid.  And cultures growing MRSA is Bactrim and doxycycline resistant.  Given recent instrumentation with partial choley and drain I suspect biliary source leading to hepatic abscess.  Although MRSA is not a, intra-abdominal organisms seen in hepatic abscesses, given recent instrumentation and retained drain would recommend targeting it.  Recommendations: - Replace drains if feasible especially R  upper quadrant  drain given source of LUQ abscess likely ruq collection. Patient has 2 drains in place. -Discontinue linezolid for now - Please administer 1 dose of oritavancin.  Restart linezolid next Friday on 10/4 - Add Augmentin.  Discontinue cefepime and metronidazole - Plan on 4 weeks of antibiotics from drain placement on 9/23(estimated 10/20), final antibiotic recommendation based on repeat imaging and clinical progression. - Follow-up with infectious disease on 10/10 with Dr. Elinor Parkinson - ID will sign off  Microbiology:   Antibiotics: Pip-tazo 9/12 Metronidazole 9/12-present Linezolid 9/13 - 9/17, 9/24-present Cefepime 9/13-present  Cultures: Blood 9/12 1/2 staph epi 9/19 no growth Urine  Other 9/23 cultures growing MRSA  HPI: [name] is a 37 y.o. male with past medical history erosive esophagitis secondary to NSAID, gastric ulcer disease, obesity admitted on 9/5 for chronic calculus cholelithiasis and underwent partial laparoscopic cholecystectomy and drain placement with surgery on 9/5.  Hospital course was complicated by worsening hypoxia tachypnea tachycardia.  Postop CT on 9/12 showed surgical drain in gallbladder fossa, minimal left basilar subsegmental atelectasis.  He was started on broad-spectrum antibiotics with cefepime, metronidazole, and linezolid at that point.  Linezolid was stopped on 9/17.  He spiked a fever on 9/19 blood cultures obtained with leukocytosis.  CT 9/21 showed image degraded due to large body habitus.  Did note 9.7 cm fluid collection in the gastrohepatic ligament which could be due to biloma or abscess, consider nuclear medicine hepatobiliary scan to eval for bile leak.  Small amount of free fluid in paracolic gutter and left lower quadrant.  IR was engaged to  aspirate intra-abdominal abscess.  Cultures grew MRSA and linezolid restarted in addition to continue cefepime and metronidazole.  ID engaged as patient is ready for discharge.  Review of  Systems: Review of Systems  All other systems reviewed and are negative.   Past Medical History:  Diagnosis Date   Acute blood loss anemia 09/02/2012   S/p 1 unit rbcs   Cellulitis    Erosive esophagitis 09/01/2012   NSAID induced.   Gall stones    Gastric ulcer with hemorrhage 09/02/2012   s/p bleeding control tx. Per Dr. Jena Gauss   Gout    Obesity    UTI (lower urinary tract infection)     Social History   Tobacco Use   Smoking status: Heavy Smoker    Current packs/day: 1.00    Average packs/day: 1 pack/day for 7.0 years (7.0 ttl pk-yrs)    Types: Cigarettes   Smokeless tobacco: Current    Types: Snuff   Tobacco comments:    daily in the evenings\  Substance Use Topics   Alcohol use: No    Alcohol/week: 0.0 standard drinks of alcohol   Drug use: Yes    Types: Marijuana    Comment: if in pain    Family History  Problem Relation Age of Onset   Heart failure Mother    Colon cancer Neg Hx    Scheduled Meds:  allopurinol  100 mg Oral Daily   Chlorhexidine Gluconate Cloth  6 each Topical Daily   colchicine  0.6 mg Oral Daily   gabapentin  300 mg Oral QID   Gerhardt's butt cream   Topical BID   guaiFENesin  1,200 mg Oral BID   heparin injection (subcutaneous)  5,000 Units Subcutaneous Q8H   hydrocortisone  1 Application Rectal BID   insulin aspart  0-20 Units Subcutaneous Q4H   lidocaine  1 patch Transdermal Daily   nystatin   Topical BID   pantoprazole (PROTONIX) IV  40 mg Intravenous Q12H   Ensure Max Protein  11 oz Oral Daily   sodium chloride flush  5 mL Intracatheter Q8H   Continuous Infusions:  sodium chloride Stopped (12/29/22 0059)   sodium chloride Stopped (01/03/23 1522)   ceFEPime (MAXIPIME) IV 2 g (01/12/23 4098)   methocarbamol (ROBAXIN) IV Stopped (01/11/23 0058)   metronidazole 500 mg (01/12/23 0824)   ondansetron (ZOFRAN) IV     Oritavancin Diphosphate (ORBACTIV) 1,200 mg in dextrose 5 % IVPB 1,200 mg (01/12/23 1105)   PRN Meds:.sodium  chloride, alum & mag hydroxide-simeth, fentaNYL (SUBLIMAZE) injection, fentaNYL, fentaNYL, fentaNYL, hydrocortisone cream, magic mouthwash, menthol-cetylpyridinium, methocarbamol (ROBAXIN) IV, midazolam, midazolam, naphazoline-glycerin, ondansetron (ZOFRAN) IV **OR** ondansetron (ZOFRAN) IV, ondansetron **OR** [DISCONTINUED] ondansetron (ZOFRAN) IV, oxyCODONE, phenol, prochlorperazine, simethicone, sodium chloride Allergies  Allergen Reactions   Asa [Aspirin] Other (See Comments)    Pt doesn't know reaction.   Clindamycin/Lincomycin     Gastric ulcer     Nsaids Other (See Comments)    Causes Ulcers    Penicillins Other (See Comments)    Pt doesn't know reaction.     Sulfa Antibiotics Other (See Comments)    Pt doesn't know reaction.    OBJECTIVE: Blood pressure 120/72, pulse 98, temperature 98.7 F (37.1 C), temperature source Oral, resp. rate 18, height 5\' 7"  (1.702 m), weight (!) 235 kg, SpO2 91%.  Physical Exam Constitutional:      General: He is not in acute distress.    Appearance: He is normal weight. He is not toxic-appearing.  HENT:  Head: Normocephalic and atraumatic.     Right Ear: External ear normal.     Left Ear: External ear normal.     Nose: No congestion or rhinorrhea.     Mouth/Throat:     Mouth: Mucous membranes are moist.     Pharynx: Oropharynx is clear.  Eyes:     Extraocular Movements: Extraocular movements intact.     Conjunctiva/sclera: Conjunctivae normal.     Pupils: Pupils are equal, round, and reactive to light.  Cardiovascular:     Rate and Rhythm: Normal rate and regular rhythm.     Heart sounds: No murmur heard.    No friction rub. No gallop.  Pulmonary:     Effort: Pulmonary effort is normal.     Breath sounds: Normal breath sounds.  Abdominal:     General: Abdomen is flat. Bowel sounds are normal.     Palpations: Abdomen is soft.     Comments: B/L UQ drians  Musculoskeletal:        General: No swelling. Normal range of motion.      Cervical back: Normal range of motion and neck supple.  Skin:    General: Skin is warm and dry.  Neurological:     General: No focal deficit present.     Mental Status: He is oriented to person, place, and time.  Psychiatric:        Mood and Affect: Mood normal.     Lab Results Lab Results  Component Value Date   WBC 8.4 01/11/2023   HGB 11.0 (L) 01/11/2023   HCT 38.0 (L) 01/11/2023   MCV 97.4 01/11/2023   PLT 317 01/11/2023    Lab Results  Component Value Date   CREATININE 0.59 (L) 01/11/2023   BUN 8 01/11/2023   NA 137 01/11/2023   K 4.3 01/11/2023   CL 100 01/11/2023   CO2 29 01/11/2023    Lab Results  Component Value Date   ALT 10 01/10/2023   AST 10 (L) 01/10/2023   ALKPHOS 60 01/10/2023   BILITOT 0.4 01/10/2023       Danelle Earthly, MD [facility] 01/12/2023, 11:36 AM   I have personally spent 82 minutes involved in face-to-face and non-face-to-face activities for this patient on the day of the visit. Professional time spent includes the following activities: Preparing to see the patient (review of tests), Obtaining and/or reviewing separately obtained history (admission/discharge record), Performing a medically appropriate examination and/or evaluation , Ordering medications/tests/procedures, referring and communicating with other health care professionals, Documenting clinical information in the EMR, Independently interpreting results (not separately reported), Communicating results to the patient/family/caregiver, Counseling and educating the patient/family/caregiver and Care coordination (not separately reported).

## 2023-01-12 NOTE — Progress Notes (Addendum)
Reached out to receiving facility 3x to give report on patient. No answer & left voicemail. Will await call back & transportation.  6:34- call back from facility & report given.

## 2023-01-12 NOTE — TOC Transition Note (Addendum)
Transition of Care [facility]) - CM/SW Discharge Note   Patient Details  Name: [name] MRN: [id number] Date of Birth: 06/11/1985  [facility]) CM/SW Contact:  Steven Prows, RN Phone Number: 01/12/2023, 10:22 AM   Clinical Narrative:    D/C orders received; spoke w/ [name] at [facility]; she gave room #A-3, bed 2; call report # [phone]; pt  notified and agrees to d/c plan;  sister [name] notified; pt transport by [name]; [name] called at [phone]; spoke w/ [name]; no TOC needs.  -1047- notified by [facility], RN pt has IV abx that infuses over 3 hours; she requested 4 PM pickup by [name]; spoke w/ [name] at [facility] and pick up time changed to 4 PM; LVM for [name] at facility; awaiting return call.  -1701- returned call from message on voicemail by [name] [phone]); no answer; unable to leave message; LVM for [name], admissions at facility; awaiting return call; spoke w/ [name], RN; she says she has attempted to call report numerous times but no answer.  -1706- called facility but no answer; notified [name], RN; she says pt has not yet been picked up by [name]  -1733- spoke w/ Steven Caldwell, RN, CM; she will attempt to make contact admissions at facility.  -1740- LVM for [name], admissions; awaiting return call; spoke w/ [name] and she says pt is still in line for transport; [name], RN notified;.  -1806- notified by [name] that [name] in admissions will have facility call [name] for report; [name]'s her contact # was provided so that report can be called.  Final next level of care: Long Term Nursing Home Barriers to Discharge: No Barriers Identified   Patient Goals and CMS Choice      Discharge Placement                Patient chooses bed at: Other - please specify in the comment section below: [facility]) Patient to be transferred to facility by: [name] Name of family member notified: [name] (sister) [phone] Patient and  family notified of of transfer: 01/12/23  Discharge Plan and Services Additional resources added to the After Visit Summary for   In-house Referral: Clinical Social Work   Post Acute Care Choice: Nursing Home                               Social Determinants of Health (SDOH) Interventions SDOH Screenings   Food Insecurity: Patient Declined (12/23/2022)  Housing: Patient Declined (08/17/2022)  Transportation Needs: No Transportation Needs (08/17/2022)  Utilities: Not At Risk (08/17/2022)  Tobacco Use: High Risk (12/21/2022)     Readmission Risk Interventions    12/26/2022    4:01 PM  Readmission Risk Prevention Plan  Post Dischage Appt Complete  Medication Screening Complete  Transportation Screening Complete

## 2023-01-12 NOTE — Discharge Instructions (Signed)
Interventional Radiology Percutaneous Abscess Drain Placement After Care   This sheet gives you information about how to care for yourself after your procedure. Your health care provider may also give you more specific instructions. Your drain was placed by an interventional radiologist with [facility]. If you have questions or concerns, contact [facility] at [phone].   What is a percutaneous drain?   A drain is a small plastic tube (catheter) that goes into the fluid collection in your body through your skin.   How long will I need the drain?   How long the drain needs to stay in is determined by where the drain is, how much comes out of the drain each day and if you are having any other surgical procedures.   Interventional radiology will determine when it is time to remove the drain. It is important to follow up as directed so that the drain can be removed as soon as it is safe to do so.   What can I expect after the procedure?   After the procedure, it is common to have:   A small amount of bruising and discomfort in the area where the drainage tube (catheter) was placed.   Sleepiness and fatigue. This should go away after the medicines you were given have worn off.   Follow these instructions at home:   Insertion site care   Check your insertion site when you change the bandage. Check for:   More redness, swelling, or pain.   More fluid or blood.   Warmth.   Pus or a bad smell.   When caring for your insertion site:   Wash your hands with soap and water for at least 20 seconds before and after you change your bandage (dressing). If soap and water are not available, use hand sanitizer.   You do not need to change your dressing everyday if it is clean and dry. Change your dressing every 3 days or as needed when it is soiled, wet or becoming dislodged. You will need to change your dressing each time you shower.   Leave stitches (sutures), skin  glue, or adhesive strips in place. These skin closures may need to stay in place for 2 weeks or longer. If adhesive strip edges start to loosen and curl up, you may trim the loose edges. Do not remove adhesive strips completely unless your health care provider tells you to do so.    Catheter care   Flush the catheter once per day with 5 mL of 0.9% normal saline unless you are told otherwise by your healthcare provider. This helps to prevent clogs in the catheter.   To disconnect the drain, turn the clear plastic tube to the left. Attach the saline syringe by placing it on the white end of the drain and turning gently to the right. Once attached gently push the plunger to the 5 mL mark. After you are done flushing, disconnect the syringe by turning to the left and reattach your drainage container    If you have a bulb please be sure the bulb is charged after reconnecting it - to do this pinch the bulb between your thumb and first finger and close the stopper located on the top of the bulb.     Check for fluid leaking from around your catheter (instead of fluid draining through your catheter). This may be a sign that the drain is no longer working correctly.   Write down the following information every time  you empty your bag:   The date and time.   The amount of drainage.   Activity   Rest at home for 1-2 days after your procedure.   For the first 48 hours do not lift anything more than 10 lbs (about a gallon of milk). You may perform moderate activities/exercise. Please avoid strenuous activities during this time.   Avoid any activities which may pull on your drain as this can cause your drain to become dislodged.   If you were given a sedative during the procedure, it can affect you for several hours. Do not drive or operate machinery until your health care provider says that it is safe.   General instructions   For mild pain take over-the-counter medications as needed for pain such  as Tylenol or Advil. If you are experiencing severe pain please call our office as this may indicate an issue with your drain.    If you were prescribed an antibiotic medicine, take it as told by your health care provider. Do not stop using the antibiotic even if you start to feel better.   You may shower 24 hours after the drain is placed. To do this cover the insertion site with a water tight material such as saran wrap and seal the edges with tape, you may also purchase waterproof dressings at your local drug store. Shower as usual and then remove the water tight dressing and any gauze/tape underneath it once you have exited the shower and dried off. Allow the area to air dry or pat dry with a clean towel. Once the skin is completely dry place a new gauze dressing. It is important to keep the site dry at all times to prevent infection.   Do not submerge the drain - this means you cannot take baths, swim, use a hot tub, etc. until the drain is removed.    Do not use any products that contain nicotine or tobacco, such as cigarettes, e-cigarettes, and chewing tobacco. If you need help quitting, ask your health care provider.   Keep all follow-up visits as told by your health care provider. This is important.   Contact a health care provider if:   You have less than 10 mL of drainage a day for 2-3 days in a row, or as directed by your health care provider.   You have any of these signs of infection:   More redness, swelling, or pain around your incision area.   More fluid or blood coming from your incision area.   Warmth coming from your incision area.   Pus or a bad smell coming from your incision area.   You have fluid leaking from around your catheter (instead of through your catheter).   You are unable to flush the drain.   You have a fever or chills.   You have pain that does not get better with medicine.   You have not been contacted to schedule a drain follow up appointment  within 10 days of discharge from the hospital.   Please call [facility] at [phone] with any questions or concerns.   Get help right away if:   Your catheter comes out.   You suddenly stop having drainage from your catheter.   You suddenly have blood in the fluid that is draining from your catheter.   You become dizzy or you faint.   You develop a rash.   You have nausea or vomiting.   You have difficulty breathing or  you feel short of breath.   You develop chest pain.   You have problems with your speech or vision.   You have trouble balancing or moving your arms or legs.   Summary   It is common to have a small amount of bruising and discomfort in the area where the drainage tube (catheter) was placed. You may also have minor discomfort with movement while the drain is in place.   Flush the drain once per day with 5 mL of 0.9% normal saline (unless you were told otherwise by your healthcare provider).    Record the amount of drainage from the bag every time you empty it.   Change the dressing every 3 days or earlier if soiled/wet. Keep the skin dry under the dressing.   You may shower with the drain in place. Do not submerge the drain (no baths, swimming, hot tubs, etc.).   Contact [facility] at [phone] if you have more redness, swelling, or pain around your incision area or if you have pain that does not get better with medicine.   This information is not intended to replace advice given to you by your health care provider. Make sure you discuss any questions you have with your health care provider.   Document Revised: 07/07/2021 Document Reviewed: 03/29/2019   Elsevier Patient Education  2023 [employer]     Interventional Radiology Drain Record   Empty your drain at least once per day. You may empty it as often as needed. Use this form to write down the amount of fluid that has collected in the drainage container. Bring this  form with you to your follow-up visits. Please call [facility] at [phone] with any questions or concerns prior to your appointment.   Drain #1 location: ___________________   Date __________ Time __________ Amount __________   Date __________ Time __________ Amount __________   Date __________ Time __________ Amount __________   Date __________ Time __________ Amount __________   Date __________ Time __________ Amount __________   Date __________ Time __________ Amount __________   Date __________ Time __________ Amount __________   Date __________ Time __________ Amount __________   Date __________ Time __________ Amount __________   Date __________ Time __________ Amount __________   Date __________ Time __________ Amount __________   Date __________ Time __________ Amount __________   Date __________ Time __________ Amount __________   Date __________ Time __________ Amount __________

## 2023-01-17 ENCOUNTER — Other Ambulatory Visit: Payer: Self-pay | Admitting: Interventional Radiology

## 2023-01-17 DIAGNOSIS — K651 Peritoneal abscess: Secondary | ICD-10-CM

## 2023-01-17 DIAGNOSIS — R188 Other ascites: Secondary | ICD-10-CM

## 2023-01-25 ENCOUNTER — Inpatient Hospital Stay: Payer: Medicaid Other | Admitting: Infectious Diseases

## 2023-01-26 ENCOUNTER — Other Ambulatory Visit: Payer: Self-pay | Admitting: Interventional Radiology

## 2023-01-26 DIAGNOSIS — K651 Peritoneal abscess: Secondary | ICD-10-CM

## 2023-01-26 DIAGNOSIS — R188 Other ascites: Secondary | ICD-10-CM

## 2023-01-30 ENCOUNTER — Other Ambulatory Visit (HOSPITAL_COMMUNITY): Payer: Medicaid Other

## 2023-01-30 ENCOUNTER — Other Ambulatory Visit (HOSPITAL_COMMUNITY): Payer: Self-pay | Admitting: Radiology

## 2023-01-30 ENCOUNTER — Ambulatory Visit (HOSPITAL_COMMUNITY)
Admission: RE | Admit: 2023-01-30 | Discharge: 2023-01-30 | Disposition: A | Discharge status: Home / Self Care | Payer: Medicaid Other | Source: Ambulatory Visit | Attending: Interventional Radiology | Admitting: Interventional Radiology

## 2023-01-30 ENCOUNTER — Ambulatory Visit (HOSPITAL_COMMUNITY)
Admission: RE | Admit: 2023-01-30 | Discharge: 2023-01-30 | Disposition: A | Discharge status: Home / Self Care | Payer: Medicaid Other | Source: Ambulatory Visit | Attending: Radiology | Admitting: Radiology

## 2023-01-30 DIAGNOSIS — R188 Other ascites: Secondary | ICD-10-CM | POA: Diagnosis present

## 2023-01-30 DIAGNOSIS — B9689 Other specified bacterial agents as the cause of diseases classified elsewhere: Secondary | ICD-10-CM | POA: Insufficient documentation

## 2023-01-30 DIAGNOSIS — K651 Peritoneal abscess: Secondary | ICD-10-CM | POA: Insufficient documentation

## 2023-01-30 HISTORY — PX: IR SINUS/FIST TUBE CHK-NON GI: IMG673

## 2023-01-30 MED ORDER — IOHEXOL 300 MG/ML  SOLN
100.0000 mL | Freq: Once | INTRAMUSCULAR | Status: AC | PRN
  Administered 2023-01-30: 100 mL via INTRAVENOUS

## 2023-01-30 NOTE — Progress Notes (Signed)
Referring Physician(s): Kinsinger,L  Supervising Physician: Ruel Favors  Patient Status:  [facility]  Chief Complaint:  Abdominal pain, nausea, postop abdominal fluid collection  Subjective: [name] is a 37 year old male with history of partial cholecystectomy on 12/21/2022 for chronic calculus cholecystitis with postop bile leak and right upper abdominal surgical drain placement .  He subsequently underwent a left upper abdominal fluid collection drain placement on 01/08/2023.  Fluid cultures grew MRSA.  Patient was subsequently discharged to [facility] on 01/12/2023.  He presents again today for follow-up CT and left upper abdominal drain evaluation.  Patient states that the left abdominal drain is currently being flushed once a day with minimal output.  He continues to complain of some upper abdominal discomfort as well as intermittent nausea and occasional fever.  He denies worsening respiratory issues.  He has not followed up with Dr. Kinsinger/[facility] since discharge.   Past Medical History:  Diagnosis Date   Acute blood loss anemia 09/02/2012   S/p 1 unit rbcs   Cellulitis    Erosive esophagitis 09/01/2012   NSAID induced.   Gall stones    Gastric ulcer with hemorrhage 09/02/2012   s/p bleeding control tx. Per Dr. Jena Gauss   Gout    Obesity    UTI (lower urinary tract infection)    Past Surgical History:  Procedure Laterality Date   CHOLECYSTECTOMY N/A 12/21/2022   Procedure: LAPAROSCOPIC CHOLECYSTECTOMY;  Surgeon: Kinsinger, De Blanch, MD;  Location: [facility];  Service: General;  Laterality: N/A;   COLONOSCOPY WITH PROPOFOL N/A 07/30/2014   RMR: Colonoscopy anal canal/internal hemorrhoids, otherwise normal ileocolonoscoopy.    ESOPHAGOGASTRODUODENOSCOPY Left 09/01/2012   LOV:FIEPPI esophageal erosions consistent with erosive reflux esophagitis/Pre-pyloric benign-appearing gastric ulcer with bleeding stigmata status post bleeding control therapy as described above    ESOPHAGOGASTRODUODENOSCOPY N/A 12/13/2012   RJJ:OACZYS hernia. PReviously noted gastric ulcer completely healed.    ESOPHAGOGASTRODUODENOSCOPY (EGD) WITH PROPOFOL N/A 07/30/2014   Procedure: ESOPHAGOGASTRODUODENOSCOPY (EGD) WITH PROPOFOL;  Surgeon: Corbin Ade, MD;  Location: [facility];  Service: Endoscopy;  Laterality: N/A;   IR RADIOLOGIST EVAL & MGMT  01/30/2023   TONSILLECTOMY        Allergies: Asa [aspirin], Clindamycin/lincomycin, Nsaids, Penicillins, and Sulfa antibiotics  Medications: Prior to Admission medications   Medication Sig Start Date End Date Taking? Authorizing Provider  acetaminophen (TYLENOL) 325 MG tablet Take 2 tablets (650 mg total) by mouth every 6 (six) hours as needed for mild pain. 07/01/22   Tyrone Nine, MD  allopurinol (ZYLOPRIM) 100 MG tablet Take 1 tablet (100 mg total) by mouth daily. 07/02/22   Tyrone Nine, MD  amoxicillin-clavulanate (AUGMENTIN) 875-125 MG tablet Take 1 tablet by mouth 2 (two) times daily for 21 days. 01/12/23 02/02/23  Danelle Earthly, MD  gabapentin (NEURONTIN) 300 MG capsule Take 1 capsule (300 mg total) by mouth 4 (four) times daily. 07/01/22   Tyrone Nine, MD  hydrocortisone (ANUSOL-HC) 2.5 % rectal cream Place 1 Application rectally 2 (two) times daily.    [provider]  Lidocaine-Menthol 4-5 % PTCH Apply 1 patch topically daily. Remove after 12 hours    [provider]  linezolid (ZYVOX) 600 MG tablet Take 1 tablet (600 mg total) by mouth 2 (two) times daily. Start taking on Saturday, 01/20/23 - continue for 2 weeks 01/20/23   Danelle Earthly, MD  methocarbamol (ROBAXIN) 750 MG tablet Take 1 tablet (750 mg total) by mouth every 8 (eight) hours as needed for up to 30  doses for muscle spasms. 01/12/23   Kinsinger, De Blanch, MD  Nystatin (GERHARDT'S BUTT CREAM) CREA Apply 1 Application topically 2 (two) times daily. 01/12/23 02/11/23  Kinsinger, De Blanch, MD  ondansetron (ZOFRAN-ODT) 4 MG disintegrating tablet Take 1  tablet (4 mg total) by mouth every 6 (six) hours as needed for nausea. 01/12/23   Kinsinger, De Blanch, MD  pantoprazole (PROTONIX) 40 MG tablet Take 1 tablet (40 mg total) by mouth 2 (two) times daily before a meal. 07/01/22   Tyrone Nine, MD     Vital Signs:none    Physical Exam: Patient awake, alert.  Chest with diminished breath sounds bases.  Heart with regular rate and rhythm. Abd- obese,  right surgical drain, left upper abdominal IR drains intact, insertion sites okay, both mildly tender to palpation.  Approximately 10 cc of yellow fluid in IR JP bulb.  Imaging: IR Radiologist Eval & Mgmt  Result Date: 01/30/2023 : Please see dictated note in [facility] from today for this IR rad eval in management. Electronically Signed   By: Judie Petit.  Shick M.D.   On: 01/30/2023 10:26   CT ABDOMEN PELVIS W CONTRAST  Result Date: 01/30/2023 CLINICAL DATA:  Cholecystectomy, status post CT drain 01/08/2023 EXAM: CT ABDOMEN AND PELVIS WITH CONTRAST TECHNIQUE: Multidetector CT imaging of the abdomen and pelvis was performed using the standard protocol following bolus administration of intravenous contrast. RADIATION DOSE REDUCTION: This exam was performed according to the departmental dose-optimization program which includes automated exposure control, adjustment of the mA and/or kV according to patient size and/or use of iterative reconstruction technique. CONTRAST:  OMNIPAQUE IOHEXOL 300 MG/ML  SOLN COMPARISON:  01/06/2023 FINDINGS: Images limited because of beam attenuation artifact from the large body habitus. Lower chest: Trace dependent pleural effusions and bibasilar atelectasis. Normal heart size. No pericardial effusion. Hepatobiliary: No large focal hepatic abnormality or biliary obstruction pattern. Surgical drain remains in place. Trace amount of fluid in the cholecystectomy surgical site measuring 2.4 cm, image 44/2. Anterior midline CT drain posterior to the left hepatic lobe is stable in  position. Significant improvement in the fluid collection compared to 01/06/2023. Small residual air-fluid collection at the drain site measures 2.4 cm, image 31/2. Pancreas: Unremarkable. No pancreatic ductal dilatation or surrounding inflammatory changes. Spleen: Normal in size without focal abnormality. Adrenals/Urinary Tract: Normal right adrenal gland. 3.5 cm left adrenal adenoma again noted. No renal obstruction or hydronephrosis. No large focal renal abnormality. No definite hydroureter or ureteral calculus. Bladder unremarkable. Stomach/Bowel: Stomach is within normal limits. Appendix appears normal. No evidence of bowel wall thickening, distention, or inflammatory changes. Vascular/Lymphatic: No significant vascular findings are present. No enlarged abdominal or pelvic lymph nodes. Reproductive: No significant finding by CT Other: No abdominal wall hernia or abnormality. No abdominopelvic ascites. Musculoskeletal: Lumbar facet arthropathy noted. No acute osseous finding. IMPRESSION: 1. Limited exam because of beam attenuation artifact from the large body habitus. 2. Trace amount of fluid in the cholecystectomy surgical site measuring 2.4 cm. 3. Stable position of the anterior midline CT drain posterior to the left hepatic lobe. Significant improvement in the fluid collection compared to 01/06/2023. 4. Small residual air-fluid collection at the drain site measures 2.4 cm. 5. Trace dependent pleural effusions and bibasilar atelectasis. Electronically Signed   By: Judie Petit.  Shick M.D.   On: 01/30/2023 10:15    Labs:  CBC: Recent Labs    01/08/23 0346 01/09/23 0408 01/10/23 0356 01/11/23 0334  WBC 17.6* 11.3* 10.3 8.4  HGB 11.3* 11.1*  11.1* 11.0*  HCT 36.9* 36.8* 38.2* 38.0*  PLT 273 277 298 317    COAGS: Recent Labs    12/28/22 1147 12/30/22 0707 01/08/23 0346  INR 1.2 1.1 1.1  APTT 34  --   --     BMP: Recent Labs    01/08/23 0346 01/09/23 0408 01/10/23 0356 01/11/23 0334  01/11/23 0344  NA 137 139 138 137  --   K 3.6 3.4* 4.0 4.3  --   CL 100 102 101 100  --   CO2 29 27 30 29   --   GLUCOSE 115* 120* 136* 111*  --   BUN 8 7 5* 8  --   CALCIUM 7.7* 7.7* 7.7* 7.8*  --   CREATININE 0.54* 0.39* 0.56* 0.59* 0.59*  GFRNONAA >60 >60 >60 >60 >60    LIVER FUNCTION TESTS: Recent Labs    01/07/23 0511 01/08/23 0346 01/09/23 0408 01/10/23 0356  BILITOT 0.5 0.5 0.5 0.4  AST 14* 12* 11* 10*  ALT 14 10 11 10   ALKPHOS 69 71 64 60  PROT 5.5* 5.8* 5.7* 5.8*  ALBUMIN 2.0* 2.0* 2.1* 2.2*    Assessment and Plan: 37 year old male with history of partial cholecystectomy on 12/21/2022 for chronic calculus cholecystitis with postop bile leak and right upper abdominal surgical drain placement .  He subsequently underwent a left upper abdominal fluid collection drain placement on 01/08/2023.  Fluid cultures grew MRSA.  Patient was subsequently discharged to [facility] on 01/12/2023.  Patient reports minimal output from left upper abdominal drain.  Follow-up CT abdomen pelvis today revealed:  1. Limited exam because of beam attenuation artifact from the large body habitus. 2. Trace amount of fluid in the cholecystectomy surgical site measuring 2.4 cm. 3. Stable position of the anterior midline CT drain posterior to the left hepatic lobe. Significant improvement in the fluid collection compared to 01/06/2023. 4. Small residual air-fluid collection at the drain site measures 2.4 cm. 5. Trace dependent pleural effusions and bibasilar atelectasis.  Left upper abdominal drain injection study was negative for fistula.  Following discussion with Dr. Miles Costain left upper abdominal drain was removed in its entirety.  Gauze dressing applied over site.  Additional plans per Dr. Sheliah Hatch.  Electronically Signed: D. Jeananne Rama, PA-C 01/30/2023, 11:12 AM   I spent a total of 20 minutes at the the patient's bedside AND on the patient's hospital floor or unit, greater than 50% of  which was counseling/coordinating care for left upper abdominal abscess drain    Patient ID: [name], male   DOB: June 05, 1985, 37 y.o.   MRN: [id number]

## 2023-02-16 ENCOUNTER — Inpatient Hospital Stay: Payer: Medicaid Other | Admitting: Infectious Diseases

## 2023-07-26 ENCOUNTER — Other Ambulatory Visit: Payer: Self-pay | Admitting: Orthopedic Surgery

## 2023-07-26 DIAGNOSIS — M25551 Pain in right hip: Secondary | ICD-10-CM

## 2023-07-26 DIAGNOSIS — R2689 Other abnormalities of gait and mobility: Secondary | ICD-10-CM
